# Patient Record
Sex: Male | Born: 1935 | Race: White | Hispanic: No | Marital: Married | Smoking: Former smoker
Health system: Southern US, Community
[De-identification: ages and names within clinical notes are randomized; demographics above are authoritative.]

## PROBLEM LIST (undated history)

## (undated) DIAGNOSIS — M1990 Unspecified osteoarthritis, unspecified site: Secondary | ICD-10-CM

## (undated) DIAGNOSIS — Z953 Presence of xenogenic heart valve: Secondary | ICD-10-CM

## (undated) DIAGNOSIS — E785 Hyperlipidemia, unspecified: Secondary | ICD-10-CM

## (undated) DIAGNOSIS — I10 Essential (primary) hypertension: Secondary | ICD-10-CM

## (undated) DIAGNOSIS — R001 Bradycardia, unspecified: Secondary | ICD-10-CM

## (undated) DIAGNOSIS — R011 Cardiac murmur, unspecified: Secondary | ICD-10-CM

## (undated) DIAGNOSIS — D649 Anemia, unspecified: Secondary | ICD-10-CM

## (undated) DIAGNOSIS — Z7901 Long term (current) use of anticoagulants: Secondary | ICD-10-CM

## (undated) DIAGNOSIS — C61 Malignant neoplasm of prostate: Secondary | ICD-10-CM

## (undated) DIAGNOSIS — E119 Type 2 diabetes mellitus without complications: Secondary | ICD-10-CM

## (undated) DIAGNOSIS — I4891 Unspecified atrial fibrillation: Secondary | ICD-10-CM

## (undated) DIAGNOSIS — R0602 Shortness of breath: Secondary | ICD-10-CM

## (undated) DIAGNOSIS — I350 Nonrheumatic aortic (valve) stenosis: Secondary | ICD-10-CM

## (undated) HISTORY — PX: CATARACT EXTRACTION W/ INTRAOCULAR LENS  IMPLANT, BILATERAL: SHX1307

## (undated) HISTORY — DX: Essential (primary) hypertension: I10

## (undated) HISTORY — DX: Long term (current) use of anticoagulants: Z79.01

## (undated) HISTORY — DX: Unspecified atrial fibrillation: I48.91

## (undated) HISTORY — PX: EYE SURGERY: SHX253

## (undated) HISTORY — DX: Unspecified osteoarthritis, unspecified site: M19.90

## (undated) HISTORY — DX: Bradycardia, unspecified: R00.1

## (undated) HISTORY — DX: Nonrheumatic aortic (valve) stenosis: I35.0

## (undated) HISTORY — DX: Hyperlipidemia, unspecified: E78.5

## (undated) HISTORY — PX: TRANSTHORACIC ECHOCARDIOGRAM: SHX275

---
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
## 2000-01-30 ENCOUNTER — Ambulatory Visit (HOSPITAL_COMMUNITY): Admission: RE | Admit: 2000-01-30 | Discharge: 2000-01-30 | Payer: Self-pay | Admitting: Family Medicine

## 2001-01-08 ENCOUNTER — Encounter: Admission: RE | Admit: 2001-01-08 | Discharge: 2001-01-08 | Payer: Self-pay | Admitting: Gastroenterology

## 2001-01-08 ENCOUNTER — Encounter: Payer: Self-pay | Admitting: Gastroenterology

## 2005-12-16 ENCOUNTER — Ambulatory Visit: Admission: RE | Admit: 2005-12-16 | Discharge: 2006-03-17 | Payer: Self-pay | Admitting: Radiation Oncology

## 2006-02-05 LAB — URINALYSIS, MICROSCOPIC - CHCC
Bilirubin (Urine): NEGATIVE
Blood: NEGATIVE
Glucose: NEGATIVE g/dL
Nitrite: NEGATIVE
Specific Gravity, Urine: 1.01 (ref 1.003–1.035)

## 2006-03-27 ENCOUNTER — Ambulatory Visit: Payer: Self-pay | Admitting: Internal Medicine

## 2007-03-19 ENCOUNTER — Ambulatory Visit: Payer: Self-pay | Admitting: Internal Medicine

## 2007-04-13 ENCOUNTER — Encounter: Admission: RE | Admit: 2007-04-13 | Discharge: 2007-04-13 | Payer: Self-pay | Admitting: Gastroenterology

## 2008-03-14 ENCOUNTER — Ambulatory Visit: Payer: Self-pay | Admitting: Internal Medicine

## 2008-09-06 ENCOUNTER — Ambulatory Visit: Payer: Self-pay | Admitting: Internal Medicine

## 2008-09-07 ENCOUNTER — Ambulatory Visit: Payer: Self-pay

## 2008-09-07 ENCOUNTER — Encounter: Payer: Self-pay | Admitting: Internal Medicine

## 2009-01-09 ENCOUNTER — Ambulatory Visit: Payer: Self-pay | Admitting: Internal Medicine

## 2009-03-22 ENCOUNTER — Encounter: Payer: Self-pay | Admitting: Internal Medicine

## 2009-09-06 DIAGNOSIS — I482 Chronic atrial fibrillation, unspecified: Secondary | ICD-10-CM | POA: Insufficient documentation

## 2009-09-06 DIAGNOSIS — I359 Nonrheumatic aortic valve disorder, unspecified: Secondary | ICD-10-CM | POA: Insufficient documentation

## 2009-09-06 DIAGNOSIS — I4891 Unspecified atrial fibrillation: Secondary | ICD-10-CM

## 2009-09-06 DIAGNOSIS — I10 Essential (primary) hypertension: Secondary | ICD-10-CM | POA: Insufficient documentation

## 2009-09-07 ENCOUNTER — Ambulatory Visit: Payer: Self-pay | Admitting: Internal Medicine

## 2010-09-04 ENCOUNTER — Encounter: Payer: Self-pay | Admitting: Internal Medicine

## 2010-09-04 ENCOUNTER — Ambulatory Visit (HOSPITAL_COMMUNITY): Admission: RE | Admit: 2010-09-04 | Discharge: 2010-09-04 | Payer: Self-pay | Admitting: Internal Medicine

## 2010-09-04 ENCOUNTER — Ambulatory Visit: Payer: Self-pay | Admitting: Cardiovascular Disease

## 2010-09-04 ENCOUNTER — Ambulatory Visit: Payer: Self-pay

## 2010-09-06 ENCOUNTER — Ambulatory Visit: Payer: Self-pay | Admitting: Internal Medicine

## 2010-12-04 NOTE — Assessment & Plan Note (Signed)
Summary: Yearly pt need echo also    History of Present Illness: [name] returns today for followup.  He is a pleasant 77 yo man with a h/o rate controlled atrial fibrillation, Aortic stenosis, hypertension and bradycardia.  Despite the above problems, he has remained asymptomatic with regard to no c/p, sob, palpitations.  He denies syncope.  He remains on chronic coumadin therapy. He has recently undergone 2D echo and his AS has progressed such that his valve area is 0.7 with a mean gradient of 51 mmHg.  He continues to be active with no limitation.  Allergies: No Known Drug Allergies  Past History:  Past Medical History: Last updated: 09/07/2009 atrial fibrillation Aortic stenosis (moderate to severe with valve area 0.95) HTN bradycardia  Review of Systems  The patient denies chest pain, syncope, dyspnea on exertion, and peripheral edema.    Physical Exam  General:  Well developed, well nourished, in no acute distress. Head:  normocephalic and atraumatic Neck:  No JVD  Lungs:  Clear bilaterally with no wheezes, rales, or rhonchi. Heart:  IRIR with normal S1.  S2 is severely reduced.  A low pitched 2/6 systolic murmur is present in the right upper sternal border. Abdomen:  Obese, NT,ND.  No rebound or guarding. Msk:  Back normal, normal gait. Muscle strength and tone normal. Pulses:  pulses normal in all 4 extremities Extremities:  No clubbing or cyanosis. Neurologic:  Alert and oriented x 3.   EKG  Procedure date:  09/06/2010  Findings:      Atrial fibrillation with a controlled ventricular response rate of: 86.  Impression & Recommendations:  Problem # 1:  AORTIC STENOSIS (ICD-424.1) His AS is worse by echo but he remains asymptomatic.  He is emphatic that he is not interested in pursuing surgery at this time. He admits that he will let me know if he develops symptoms of c/p, sob or syncope. His updated medication list for this problem includes:    Toprol Xl  25 Mg Xr24h-tab (Metoprolol succinate) ..... One tab once daily    Lisinopril 20 Mg Tabs (Lisinopril) .Marland Kitchen... Take one tablet by mouth daily    Hydrochlorothiazide 12.5 Mg Tabs (Hydrochlorothiazide) .Marland Kitchen... Take one tablet by mouth daily.    Digoxin 0.25 Mg Tabs (Digoxin) .Marland Kitchen... Take one tablet by mouth daily  Problem # 2:  ATRIAL FIBRILLATION, CHRONIC (ICD-427.31) His ventricula rate is well controlled.  He will continue his current meds. His updated medication list for this problem includes:    Toprol Xl 25 Mg Xr24h-tab (Metoprolol succinate) ..... One tab once daily    Digoxin 0.25 Mg Tabs (Digoxin) .Marland Kitchen... Take one tablet by mouth daily    Warfarin Sodium 7.5 Mg Tabs (Warfarin sodium) ..... Use as directed by anticoagulation clinic  Patient Instructions: 1)  Your physician wants you to follow-up in: 6 months with Dr Court Joy will receive a reminder letter in the mail two months in advance. If you don't receive a letter, please call our office to schedule the follow-up appointment.

## 2010-12-10 ENCOUNTER — Encounter: Payer: Self-pay | Admitting: Internal Medicine

## 2010-12-20 NOTE — Miscellaneous (Signed)
Summary: Orders Update  Clinical Lists Changes  Medications: Removed medication of TOPROL XL 25 MG XR24H-TAB (METOPROLOL SUCCINATE) one tab once daily Added new medication of METOPROLOL TARTRATE 25 MG TABS (METOPROLOL TARTRATE) Take 1/2  tablet by mouth twice a day - Signed Rx of METOPROLOL TARTRATE 25 MG TABS (METOPROLOL TARTRATE) Take 1/2  tablet by mouth twice a day;  #30 x 6;  Signed;  Entered by: Ollen Gross, RN, BSN;  Authorized by: Laren Boom, MD, [facility];  Method used: Electronically to [facility]  [phone]*, [address], [place], [place], [place]  [address], Ph: [phone] or [phone], Fax: [phone]    Prescriptions: METOPROLOL TARTRATE 25 MG TABS (METOPROLOL TARTRATE) Take 1/2  tablet by mouth twice a day  #30 x 6   Entered by:   Ollen Gross, RN, BSN   Authorized by:   Laren Boom, MD, [facility]   Signed by:   Ollen Gross, RN, BSN on 12/10/2010   Method used:   Electronically to        [facility]  [id number]* (retail)       [address]       [place], [place]  [address]       Ph: [phone] or [phone]       Fax: [phone]   RxID:   [id number]

## 2011-03-19 NOTE — Assessment & Plan Note (Signed)
[facility]                         ELECTROPHYSIOLOGY OFFICE NOTE   DEMAREA, LOREY                    MRN:          [id number]  DATE:03/14/2008                            DOB:          09-25-1933    Mr. Paulino returns today for followup.  He is a very pleasant male  with a history of atrial fibrillation and bradycardia.  His rates have  been in the 30s and 40s in the recent past. He returns today for  followup.  The patient denies chest pain.  He denies dizziness.  He  denies shortness of breath.  He has been amazingly well.  He does have  some difficulty with urination and has recently been started on Flomax.   CURRENT MEDICATIONS:  1. Lanoxin 0.25 a day.  2. Warfarin as directed.  3. Lisinopril/hydrochlorothiazide.  4. Doxazosin.  5. Metformin.  6. Glipizide.   PHYSICAL EXAMINATION:  GENERAL:  He is a pleasant, elderly man in no  acute distress.  VITAL SIGNS:  Blood pressure today was 156/82.  The pulse was 46 and  irregular. Respirations were 18.  Weight was 190 pounds.  NECK:  Revealed no jugular distention.  LUNGS:  Clear bilaterally to auscultation.  No wheezes, rales or rhonchi  are present.  CARDIOVASCULAR:  Exam revealed irregular bradycardia with normal S1-S2.  EXTREMITIES:  Demonstrated no edema..   EKG demonstrates atrial fibrillation with a slow ventricular response.   IMPRESSION:  1. Chronic atrial fibrillation with slow ventricular response.  2. Hypertension.   DISCUSSION:  The patient is amazingly stable despite his very severe  bradycardia.  As best I can tell, he is asymptomatic.  I will plan on  seeing the patient back in the office in 1 year.  I have asked today  that he decrease his Lanoxin by half, and he will follow up with Dr.  Benedetto Goad with [facility].     Doylene Canning. Ladona Ridgel, MD  Electronically Signed    GWT/MedQ  DD: 03/14/2008  DT: 03/14/2008  Job #: [id number]   cc:   Gloriajean Dell.  Andrey Campanile, M.D.

## 2011-03-19 NOTE — Assessment & Plan Note (Signed)
[facility]                         ELECTROPHYSIOLOGY OFFICE NOTE   MICHELLE, VANHISE                    MRN:          [id number]  DATE:01/09/2009                            DOB:          06-25-1933    Mr. Peplinski returns today for followup.  He is a very pleasant 76-year-  old male with a history of chronic atrial fibrillation and chronic  Coumadin therapy who also has a history of hypertension who returns  today for followup.  When I saw him last several months ago, I noted  that he had a more prominent aortic stenosis murmur and had him undergo  2-D echocardiography, which demonstrates that he has at least moderate-  to-severe aortic stenosis with an estimated valve area of 0.9 cm  squared.  Despite this, the patient is completely asymptomatic.  He  denies chest pain.  He denies shortness of breath.  In addition, his  echo demonstrated an EF of 65%.  He is here today for followup and is  otherwise stable.  He had no specific complaints today.  Denied any  chest pain, any shortness of breath, or near syncope.   His medications include:  1. Lisinopril 20 mg twice daily.  2. Hydrochlorothiazide 12.5 daily.  3. Potassium gluconate daily.  4. Vitamin D daily.  5. Lanoxin 0.25 mg 2 tablets daily.  6. Warfarin 7.5 mg daily.  7. Lisinopril/hydrochlorothiazide daily.  8. Metoprolol 25 a day.  9. Doxazosin 8 a day.  10.Simvastatin 20 a day.  11.Glipizide ER 5 mg 2 tablets daily  12.Multiple vitamins.  13.He is also on metformin 1 g twice a day.   PHYSICAL EXAMINATION:  GENERAL  He is a pleasant very well-appearing 48-  year-old man in no distress.  VITAL SIGNS:  Blood pressure was 110/70, pulse was 68 and irregular,  respirations were 18.  The weight was 194 pounds.  NECK:  No jugular venous distention.  LUNGS:  Clear to bilateral auscultation.  No wheezes, rales, or rhonchi  are present.  No increased work of breathing.  CARDIOVASCULAR:   Irregular rate and rhythm with normal S1 and S2.  ABDOMEN:  Soft, nontender.  EXTREMITIES:  No edema.   EKG demonstrates atrial fibrillation with controlled ventricular  response.   IMPRESSION:  1. Chronic atrial fibrillation, well controlled.  2. Hypertension, well controlled.  3. Aortic stenosis, presently asymptomatic.   DISCUSSION:  I have recommend a period of watchful waiting with Mr.  Amezcua.  I will see him back in a year.  I have encouraged him to call  us sooner if he develops symptoms of aortic stenosis, specifically chest  pain, shortness of breath syncope, or syncope.     Doylene Canning. Ladona Ridgel, MD  Electronically Signed    GWT/MedQ  DD: 01/09/2009  DT: 01/10/2009  Job #: [id number]   cc:   Gloriajean Dell. Andrey Campanile, M.D.

## 2011-03-19 NOTE — Assessment & Plan Note (Signed)
[facility]                         ELECTROPHYSIOLOGY OFFICE NOTE   [name], Aaron                    MRN:          [id number]  DATE:09/06/2008                            DOB:          [birth year]    [name] returns today for followup.  He is a very pleasant 77-year-  old male with a history of atrial fibrillation which has been chronic.  He had a history of significant bradycardia and AFib, which has improved  over the last year with decreasing of his digoxin dosing.  He returns  today for followup.  He has done quite well in the last year.  He is  able to work in his yard and had no specific complaints today.  He tells  me that his diabetes has been fairly well controlled.  He is little  concerned about his blood pressure which has been slightly elevated,  typically in the 130-140 or perhaps as high as 150 range.  On the other  hand, he has not had any significant dizziness or lightheadedness which  had previously been once of his main complaints when he was bradycardic.  He has had no syncope.  He denies other symptoms.   MEDICATIONS:  Today include;  1. Warfarin as directed.  2. Lanoxin 0.25 a day.  3. Lisinopril/HCTZ 20/12.5 a day.  4. Metoprolol extended release 25 a day.  5. Doxazosin 8 mg a day.  6. Simvastatin 20 a day.  7. Glipizide ER 5 mg 2 tablets daily.  8. Multiple vitamins.  9. Metformin 1 g twice daily.   PHYSICAL EXAMINATION:  GENERAL:  He is a pleasant well-appearing 77-year-  old man in no acute distress.  VITAL SIGNS:  Blood pressure was 132/80, the pulse was 60 and regular,  the respirations were 18, and the weight was 194 pounds.  NECK:  No  jugular venous distention.  LUNGS:  Clear bilaterally to auscultation.  No wheezes, rales, or  rhonchi are present.  CARDIOVASCULAR:  Irregular regular rhythm and normal S1 and S2.  There  is a great 3/6 systolic murmur at the right upper sternal border.  I had  not  appreciated this previously.  ABDOMEN:  Soft and nontender.  EXTREMITIES:  No edema.   EKG demonstrates atrial fibrillation with left anterior fascicular block  and a rate of 60 beats per minute.   IMPRESSION:  1. Chronic atrial fibrillation.  Now, nicely rate controlled with no      symptomatic bradycardia.  2. Hypertension.  Slightly elevated, but probably reasonably well      controlled.  3. Abnormal cardiac evaluation exam was of fairly prominent murmur of      either aortic stenosis or aortic sclerosis.  I cannot tell which      based on the exam, though I do not think he has critical aortic      stenosis based on the exam.   DISCUSSION:  Overall, Mr. Loewen is stable today.  I am little bit  concerned about his murmur of aortic stenosis and for this reason, I  will obtain a 2-D echo.  He has  not tried to get a influenza vaccine up  until now, but his primary physician Dr. Benedetto Goad has not had any  available in his office according to the patient.  For this reason, I  will have him undergo influenza vaccination today as well.  I have asked  that he followup with his primary physician, Dr. Benedetto Goad, for  ongoing blood pressure evaluation.  I have encouraged him to increase  his walking, lower his salt intake, and try to lose little bit of  weight.  I will plan to see the patient back in 1 year.     Doylene Canning. Ladona Ridgel, MD  Electronically Signed    GWT/MedQ  DD: 09/06/2008  DT: 09/06/2008  Job #: [id number]   cc:   Gloriajean Dell. Andrey Campanile, M.D.

## 2011-03-19 NOTE — Assessment & Plan Note (Signed)
[facility]                         ELECTROPHYSIOLOGY OFFICE NOTE   ALDRED, MASE                      MRN:          [id number]  DATE:03/19/2007                            DOB:          03-Aug-1933    [name] returns today for follow-up of his atrial fibrillation.  He  is a very pleasant 77 year old man with hypertension, dyslipidemia and  atrial fibrillation.  He has undergone a strategy of rate control and  has done quite well with this.  He has no symptoms of his atrial  fibrillation.  He denies palpitations.  He denies chest pressure.  He  rarely will feel some mild dizziness when he stands up real quickly.  Otherwise he had no specific complaints.  He denies peripheral edema.   PHYSICAL EXAMINATION:  GENERAL APPEARANCE:  He is a pleasant well-  appearing 77 year old man in no distress.  VITAL SIGNS:  Blood pressure was 122/70, pulse 61 and irregular,  respirations 18, weight 195 pounds.  NECK:  No jugular venous distension.  LUNGS:  Clear to auscultation bilaterally.  No wheezing, rhonchi or  rales.  CARDIOVASCULAR:  Irregularly irregular rhythm with normal S1 and S2.  EXTREMITIES:  No edema.   His EKG demonstrates atrial fibrillation with an occasional PVC and a  left anterior fascicular block.   IMPRESSION:  1. Symptomatic atrial fibrillation.  2. Chronic rate control therapy.  3. Chronic Coumadin therapy.  4. Hypertension.   DISCUSSION:  Overall, [name] is stable and his blood pressure is  well controlled.  His atrial fibrillation is rate controlled very nicely  and he is asymptomatic with this.  He will continue on Coumadin and he  is tolerating this very well.  Will plan to see him back in the office  in one year.     Doylene Canning. Ladona Ridgel, MD  Electronically Signed    GWT/MedQ  DD: 03/19/2007  DT: 03/19/2007  Job #: [id number]   cc:   Gloriajean Dell. Andrey Campanile, M.D.

## 2011-04-09 ENCOUNTER — Encounter: Payer: Self-pay | Admitting: Internal Medicine

## 2011-07-24 ENCOUNTER — Other Ambulatory Visit: Payer: Self-pay | Admitting: Internal Medicine

## 2011-09-12 ENCOUNTER — Ambulatory Visit (INDEPENDENT_AMBULATORY_CARE_PROVIDER_SITE_OTHER): Payer: Medicare Other | Admitting: Internal Medicine

## 2011-09-12 ENCOUNTER — Encounter: Payer: Self-pay | Admitting: Internal Medicine

## 2011-09-12 VITALS — BP 130/60 | HR 62 | Ht 67.0 in | Wt 191.0 lb

## 2011-09-12 DIAGNOSIS — I4891 Unspecified atrial fibrillation: Secondary | ICD-10-CM

## 2011-09-12 DIAGNOSIS — I10 Essential (primary) hypertension: Secondary | ICD-10-CM

## 2011-09-12 DIAGNOSIS — I359 Nonrheumatic aortic valve disorder, unspecified: Secondary | ICD-10-CM

## 2011-09-12 NOTE — Assessment & Plan Note (Signed)
Despite his severe aortic stenosis, he is asymptomatic. I discussed the warning signs with the patient. He still is not interested in pursuing surgery until he becomes symptomatic. He will call us for chest pain, shortness of breath, or syncope.

## 2011-09-12 NOTE — Progress Notes (Signed)
HPI [name] returns today for followup. He is a very pleasant 77 year old man with a history of hypertension, chronic atrial fibrillation, and aortic stenosis. I last saw him a year ago. At that time he had surgical aortic stenosis but was emphatic about not considering having any cardiac surgery. I warned him about the risk factors suggesting that his bowels getting worse and he agreed that he would call me if he had symptoms. Over the last year, he has done well. Yesterday he replaced his water heater, carrying the old one out of his house. He did note sob with extreme exertion. No syncope or chest pain. No Known Allergies   Current Outpatient Prescriptions  Medication Sig Dispense Refill  . cholecalciferol (VITAMIN D) 1000 UNITS tablet Take 1,000 Units by mouth 2 (two) times daily.        . Cyanocobalamin (VITAMIN B-12 CR) 1500 MCG TBCR Take 1 tablet by mouth daily.        . digoxin (LANOXIN) 0.25 MG tablet Take 250 mcg by mouth daily.        Marland Kitchen doxazosin (CARDURA) 8 MG tablet Take 8 mg by mouth at bedtime.        Marland Kitchen glipiZIDE (GLUCOTROL) 5 MG tablet Take 5 mg by mouth 2 (two) times daily before a meal.        . hydrochlorothiazide (MICROZIDE) 12.5 MG capsule Take 12.5 mg by mouth daily.        Marland Kitchen lisinopril-hydrochlorothiazide (PRINZIDE,ZESTORETIC) 20-12.5 MG per tablet Take 1 tablet by mouth 2 (two) times daily.        . magnesium gluconate (MAGONATE) 500 MG tablet Take 500 mg by mouth 2 (two) times daily.        . metFORMIN (GLUCOPHAGE) 1000 MG tablet Take 1,000 mg by mouth 2 (two) times daily with a meal.        . metoprolol tartrate (LOPRESSOR) 25 MG tablet TAKE ONE-HALF TABLET BY MOUTH TWICE DAILY.  30 tablet  6  . RA POTASSIUM GLUCONATE PO Take 99 mg by mouth 2 (two) times daily.        . Selenium 200 MCG CAPS Take 1 capsule by mouth daily.        . simvastatin (ZOCOR) 40 MG tablet Take 40 mg by mouth at bedtime.        . vitamin A (BL VITAMIN A) 8000 UNIT capsule Take 8,000 Units by  mouth daily.        . vitamin C (ASCORBIC ACID) 500 MG tablet Take 500 mg by mouth 2 (two) times daily.        Marland Kitchen warfarin (COUMADIN) 7.5 MG tablet Take 7.5 mg by mouth as directed.        . zinc gluconate 50 MG tablet Take 50 mg by mouth daily.           Past Medical History  Diagnosis Date  . Atrial fibrillation   . Aortic stenosis      moderate to severe with valve area 0.95  . HTN (hypertension)   . Bradycardia     ROS:   All systems reviewed and negative except as noted in the HPI.   Past Surgical History  Procedure Date  . Transthoracic echocardiogram 09/04/10, 09/07/08     No family history on file.   History   Social History  . Marital Status: Married    Spouse Name: N/A    Number of Children: N/A  . Years of Education: N/A   Occupational History  .  Not on file.   Social History Main Topics  . Smoking status: Former Smoker    Quit date: 09/11/1976  . Smokeless tobacco: Not on file  . Alcohol Use: Not on file  . Drug Use: Not on file  . Sexually Active: Not on file   Other Topics Concern  . Not on file   Social History Narrative  . No narrative on file     There were no vitals taken for this visit.  Physical Exam:  Well appearing 77 year old man, NAD HEENT: Unremarkable Neck:  No JVD, no thyromegally Lymphatics:  No adenopathy Back:  No CVA tenderness Lungs:  Clear with no wheezes, rales, or rhonchi. HEART:  IRegular rate rhythm,  Grade 2/6 systolic murmur heard best at the right upper sternal border. S2 is reduced. Abd:  soft, positive bowel sounds, no organomegally, no rebound, no guarding Ext:  2 plus pulses, no edema, no cyanosis, no clubbing Skin:  No rashes no nodules Neuro:  CN II through XII intact, motor grossly intact  EKG Atrial fibrillation with occasional PVCs Assess/Plan:

## 2011-09-12 NOTE — Assessment & Plan Note (Signed)
He is instructed to maintain a low-sodium diet and continue his current medical therapy.

## 2011-09-12 NOTE — Assessment & Plan Note (Signed)
His symptoms and ventricular rate are well-controlled. He will continue his current medical therapy.

## 2011-09-12 NOTE — Patient Instructions (Signed)
Your physician wants you to follow-up in: 1 year with Dr. Taylor. You will receive a reminder letter in the mail two months in advance. If you don't receive a letter, please call our office to schedule the follow-up appointment.  Your physician recommends that you continue on your current medications as directed. Please refer to the Current Medication list given to you today.  

## 2012-02-18 ENCOUNTER — Encounter (INDEPENDENT_AMBULATORY_CARE_PROVIDER_SITE_OTHER): Payer: Self-pay | Admitting: Ophthalmology

## 2012-02-19 ENCOUNTER — Encounter (INDEPENDENT_AMBULATORY_CARE_PROVIDER_SITE_OTHER): Payer: Medicare Other | Admitting: Ophthalmology

## 2012-02-19 ENCOUNTER — Other Ambulatory Visit: Payer: Self-pay | Admitting: Internal Medicine

## 2012-02-19 DIAGNOSIS — H35039 Hypertensive retinopathy, unspecified eye: Secondary | ICD-10-CM

## 2012-02-19 DIAGNOSIS — I10 Essential (primary) hypertension: Secondary | ICD-10-CM

## 2012-02-19 DIAGNOSIS — E1139 Type 2 diabetes mellitus with other diabetic ophthalmic complication: Secondary | ICD-10-CM

## 2012-02-19 DIAGNOSIS — H26499 Other secondary cataract, unspecified eye: Secondary | ICD-10-CM

## 2012-02-19 DIAGNOSIS — E11319 Type 2 diabetes mellitus with unspecified diabetic retinopathy without macular edema: Secondary | ICD-10-CM

## 2012-02-19 DIAGNOSIS — H43819 Vitreous degeneration, unspecified eye: Secondary | ICD-10-CM

## 2012-03-02 ENCOUNTER — Ambulatory Visit (INDEPENDENT_AMBULATORY_CARE_PROVIDER_SITE_OTHER): Payer: Medicare Other | Admitting: Ophthalmology

## 2012-03-02 DIAGNOSIS — H2700 Aphakia, unspecified eye: Secondary | ICD-10-CM

## 2012-03-09 ENCOUNTER — Encounter (INDEPENDENT_AMBULATORY_CARE_PROVIDER_SITE_OTHER): Payer: Self-pay | Admitting: Ophthalmology

## 2012-05-12 ENCOUNTER — Other Ambulatory Visit: Payer: Self-pay | Admitting: Urology

## 2012-05-12 DIAGNOSIS — IMO0002 Reserved for concepts with insufficient information to code with codable children: Secondary | ICD-10-CM

## 2012-05-12 DIAGNOSIS — N62 Hypertrophy of breast: Secondary | ICD-10-CM

## 2012-05-18 ENCOUNTER — Ambulatory Visit
Admission: RE | Admit: 2012-05-18 | Discharge: 2012-05-18 | Disposition: A | Discharge status: Home / Self Care | Payer: Medicare Other | Source: Ambulatory Visit | Attending: Urology | Admitting: Urology

## 2012-05-18 DIAGNOSIS — IMO0002 Reserved for concepts with insufficient information to code with codable children: Secondary | ICD-10-CM

## 2012-05-18 DIAGNOSIS — N62 Hypertrophy of breast: Secondary | ICD-10-CM

## 2012-09-11 ENCOUNTER — Ambulatory Visit (INDEPENDENT_AMBULATORY_CARE_PROVIDER_SITE_OTHER): Payer: Medicare Other | Admitting: Internal Medicine

## 2012-09-11 ENCOUNTER — Encounter: Payer: Self-pay | Admitting: Internal Medicine

## 2012-09-11 VITALS — BP 130/70 | HR 63 | Ht 69.6 in | Wt 187.0 lb

## 2012-09-11 DIAGNOSIS — I350 Nonrheumatic aortic (valve) stenosis: Secondary | ICD-10-CM

## 2012-09-11 DIAGNOSIS — I359 Nonrheumatic aortic valve disorder, unspecified: Secondary | ICD-10-CM

## 2012-09-11 DIAGNOSIS — I4891 Unspecified atrial fibrillation: Secondary | ICD-10-CM

## 2012-09-11 NOTE — Assessment & Plan Note (Signed)
I discussed treatment options with the patient. We will repeat his 2-D echo to look at his left ventricular function. I will refer him to my partner Dr. Excell Seltzer to consider all options for therapy.

## 2012-09-11 NOTE — Progress Notes (Signed)
HPI [name] returns today for followup. He is a very pleasant 78 year old man with a history of critical aortic stenosis, who is been asymptomatic in the past. 2 years ago, he has surgical disease but no symptoms and despite my urging to consider surgery, refused. When I saw me year ago, he was still asymptomatic and continued to refuse consideration of any surgical procedure. In the last few months, he has noted dyspnea with exertion. He has had no syncope and no chest pain. He remains active but notes that with any significant exertion, he gets short of breath and has to stop what he is doing. He can walk just fine on flat ground. The patient has been uninterested in aortic valve replacement utilizing a thoracotomy were median sternotomy that is interested in percutaneous options. No Known Allergies   Current Outpatient Prescriptions  Medication Sig Dispense Refill  . B Complex Vitamins (VITAMIN B-COMPLEX PO) Take by mouth. 50 mcg 1 tab PO      . cholecalciferol (VITAMIN D) 1000 UNITS tablet Take 1,000 Units by mouth 2 (two) times daily.        . Cyanocobalamin (VITAMIN B-12 CR) 1500 MCG TBCR Take 1 tablet by mouth daily.        . digoxin (LANOXIN) 0.25 MG tablet Take 250 mcg by mouth daily.        Marland Kitchen doxazosin (CARDURA) 8 MG tablet Take 8 mg by mouth at bedtime.        Marland Kitchen glipiZIDE (GLUCOTROL) 5 MG tablet Take 5 mg by mouth 2 (two) times daily before a meal.        . hydrochlorothiazide (MICROZIDE) 12.5 MG capsule Take 12.5 mg by mouth daily.        Marland Kitchen lisinopril-hydrochlorothiazide (PRINZIDE,ZESTORETIC) 20-12.5 MG per tablet Take 2 tablets by mouth daily.       . magnesium gluconate (MAGONATE) 500 MG tablet Take 500 mg by mouth 2 (two) times daily.        . metFORMIN (GLUCOPHAGE) 1000 MG tablet Take 1,000 mg by mouth 2 (two) times daily with a meal.        . metoprolol tartrate (LOPRESSOR) 25 MG tablet TAKE ONE-HALF TABLET BY MOUTH TWICE DAILY  30 tablet  6  . NON FORMULARY Testerone  injection (1 Shot)  every two weeks.      Marland Kitchen RA POTASSIUM GLUCONATE PO Take 99 mg by mouth 2 (two) times daily.        . Selenium 200 MCG CAPS Take 1 capsule by mouth daily.        . simvastatin (ZOCOR) 40 MG tablet Take 40 mg by mouth at bedtime.        . vitamin A (BL VITAMIN A) 8000 UNIT capsule Take 8,000 Units by mouth daily.        . vitamin C (ASCORBIC ACID) 500 MG tablet Take 500 mg by mouth 2 (two) times daily.        Marland Kitchen warfarin (COUMADIN) 7.5 MG tablet Take 7.5 mg by mouth as directed.        . zinc gluconate 50 MG tablet Take 50 mg by mouth daily.           Past Medical History  Diagnosis Date  . Atrial fibrillation   . Aortic stenosis      moderate to severe with valve area 0.95  . HTN (hypertension)   . Bradycardia     ROS:   All systems reviewed and negative except as noted in  the HPI.   Past Surgical History  Procedure Date  . Transthoracic echocardiogram 09/04/10, 09/07/08     No family history on file.   History   Social History  . Marital Status: Married    Spouse Name: N/A    Number of Children: N/A  . Years of Education: N/A   Occupational History  . Not on file.   Social History Main Topics  . Smoking status: Former Smoker    Quit date: 09/11/1976  . Smokeless tobacco: Not on file  . Alcohol Use: Not on file  . Drug Use: Not on file  . Sexually Active: Not on file   Other Topics Concern  . Not on file   Social History Narrative  . No narrative on file     BP 130/70  Pulse 63  Ht 5' 9.6" (1.768 m)  Wt 187 lb (84.823 kg)  BMI 27.14 kg/m2  Physical Exam:  Well appearing 78 year old man, NAD HEENT: Unremarkable Neck:  7 cm JVD, no thyromegally Lungs:  Clear HEART:  Regular rate rhythm, grade 2/6 systolic murmur heard best at the upper sternal border., no rubs, no clicks Abd:  soft, positive bowel sounds, no organomegally, no rebound, no guarding Ext:  2 plus pulses, no edema, no cyanosis, no clubbing Skin:  No rashes no  nodules Neuro:  CN II through XII intact, motor grossly intact  EKG atrial fibrillation with right bundle branch block    Assess/Plan:

## 2012-09-11 NOTE — Assessment & Plan Note (Signed)
His ventricular rate appears to be well-controlled. No change in medical therapy. 

## 2012-09-11 NOTE — Patient Instructions (Addendum)
Your physician wants you to follow-up in: 12 months with Dr Court Joy will receive a reminder letter in the mail two months in advance. If you don't receive a letter, please call our office to schedule the follow-up appointment.   Your physician has requested that you have an echocardiogram. Echocardiography is a painless test that uses sound waves to create images of your heart. It provides your doctor with information about the size and shape of your heart and how well your heart's chambers and valves are working. This procedure takes approximately one hour. There are no restrictions for this procedure.  You have been referred to Dr Excell Seltzer

## 2012-09-16 ENCOUNTER — Ambulatory Visit (HOSPITAL_COMMUNITY): Payer: Medicare Other | Attending: Cardiovascular Disease | Admitting: Radiology

## 2012-09-16 DIAGNOSIS — R9389 Abnormal findings on diagnostic imaging of other specified body structures: Secondary | ICD-10-CM | POA: Insufficient documentation

## 2012-09-16 DIAGNOSIS — I080 Rheumatic disorders of both mitral and aortic valves: Secondary | ICD-10-CM | POA: Insufficient documentation

## 2012-09-16 DIAGNOSIS — I079 Rheumatic tricuspid valve disease, unspecified: Secondary | ICD-10-CM | POA: Insufficient documentation

## 2012-09-16 DIAGNOSIS — E785 Hyperlipidemia, unspecified: Secondary | ICD-10-CM | POA: Insufficient documentation

## 2012-09-16 DIAGNOSIS — I4510 Unspecified right bundle-branch block: Secondary | ICD-10-CM | POA: Insufficient documentation

## 2012-09-16 DIAGNOSIS — I359 Nonrheumatic aortic valve disorder, unspecified: Secondary | ICD-10-CM

## 2012-09-16 DIAGNOSIS — I4891 Unspecified atrial fibrillation: Secondary | ICD-10-CM | POA: Insufficient documentation

## 2012-09-16 DIAGNOSIS — R0609 Other forms of dyspnea: Secondary | ICD-10-CM | POA: Insufficient documentation

## 2012-09-16 DIAGNOSIS — I350 Nonrheumatic aortic (valve) stenosis: Secondary | ICD-10-CM

## 2012-09-16 DIAGNOSIS — R0989 Other specified symptoms and signs involving the circulatory and respiratory systems: Secondary | ICD-10-CM | POA: Insufficient documentation

## 2012-09-16 DIAGNOSIS — I10 Essential (primary) hypertension: Secondary | ICD-10-CM | POA: Insufficient documentation

## 2012-09-16 NOTE — Progress Notes (Signed)
Echocardiogram performed.  

## 2012-09-18 ENCOUNTER — Other Ambulatory Visit: Payer: Self-pay | Admitting: Internal Medicine

## 2012-11-27 ENCOUNTER — Telehealth: Payer: Self-pay | Admitting: *Deleted

## 2012-11-27 NOTE — Telephone Encounter (Signed)
Patient aware of his echo results.  He has never been made an appointment with Dr Excell Seltzer.  I have again sent this to [facility].  Patient aware and will call if he does not here from anyone

## 2012-12-07 ENCOUNTER — Ambulatory Visit (INDEPENDENT_AMBULATORY_CARE_PROVIDER_SITE_OTHER): Payer: Medicare Other | Admitting: Cardiovascular Disease

## 2012-12-07 ENCOUNTER — Encounter: Payer: Self-pay | Admitting: Cardiovascular Disease

## 2012-12-07 VITALS — BP 172/72 | HR 60 | Ht 68.0 in | Wt 189.0 lb

## 2012-12-07 DIAGNOSIS — I359 Nonrheumatic aortic valve disorder, unspecified: Secondary | ICD-10-CM

## 2012-12-07 NOTE — Progress Notes (Signed)
HPI:  79 year old gentleman presenting for evaluation of severe aortic stenosis. He has been followed for years by Dr. Ladona Ridgel. He has long-standing severe aortic stenosis, previously asymptomatic but now with exertional dyspnea. Surgical consultation has been recommended but the patient has not been willing to pursue this. He was amenable to consideration of less invasive therapies and presents today to explore other options, including TAVR.  Over the last year the patient has developed symptoms of exertional shortness of breath. This occurs with walking a moderate distance or doing physical work. When he uses his chainsaw he has to rest frequently. He denies exertional chest tightness or pain. He denies lightheadedness, palpitations, edema, orthopnea, or PND. 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 He remains physically active. The patient lives at home with his wife who is limited by osteoarthritis. The patient has no history of stroke or TIA. He's had no major surgeries.  Outpatient Encounter Prescriptions as of 12/07/2012  Medication Sig Dispense Refill  . B Complex Vitamins (VITAMIN B-COMPLEX PO) Take by mouth. 50 mcg 1 tab PO      . cholecalciferol (VITAMIN D) 1000 UNITS tablet Take 1,000 Units by mouth 2 (two) times daily.        . Cyanocobalamin (VITAMIN B-12 CR) 1500 MCG TBCR Take 1 tablet by mouth daily.        . digoxin (LANOXIN) 0.25 MG tablet Take 250 mcg by mouth daily.        Marland Kitchen doxazosin (CARDURA) 8 MG tablet Take 8 mg by mouth at bedtime.        Marland Kitchen glipiZIDE (GLUCOTROL) 5 MG tablet Take 5 mg by mouth 2 (two) times daily before a meal.        . lisinopril-hydrochlorothiazide (PRINZIDE,ZESTORETIC) 20-12.5 MG per tablet Take 2 tablets by mouth daily.       . magnesium gluconate (MAGONATE) 500 MG tablet Take 500 mg by mouth 2 (two) times daily.        . metFORMIN (GLUCOPHAGE) 1000 MG tablet Take 1,000 mg by mouth 2 (two) times daily with a meal.        .  metoprolol tartrate (LOPRESSOR) 25 MG tablet TAKE ONE-HALF TABLET BY MOUTH TWICE DAILY  90 tablet  3  . RA POTASSIUM GLUCONATE PO Take 595 mg by mouth 2 (two) times daily.       . Selenium 200 MCG CAPS Take 1 capsule by mouth daily.        . simvastatin (ZOCOR) 40 MG tablet Take 40 mg by mouth at bedtime.        . vitamin A (BL VITAMIN A) 8000 UNIT capsule Take 8,000 Units by mouth daily.        . vitamin C (ASCORBIC ACID) 500 MG tablet Take 500 mg by mouth 2 (two) times daily.        Marland Kitchen warfarin (COUMADIN) 7.5 MG tablet Take 7.5 mg by mouth as directed.        . zinc gluconate 50 MG tablet Take 50 mg by mouth daily.        . [DISCONTINUED] hydrochlorothiazide (MICROZIDE) 12.5 MG capsule Take 12.5 mg by mouth daily.        . [DISCONTINUED] NON FORMULARY Testerone injection (1 Shot)  every two weeks.        Review of patient's allergies indicates no known allergies.  Past Medical History  Diagnosis Date  . Atrial fibrillation   . Aortic stenosis  moderate to severe with valve area 0.95  . HTN (hypertension)   . Bradycardia     Past Surgical History  Procedure Date  . Transthoracic echocardiogram 09/04/10, 09/07/08    History   Social History  . Marital Status: Married    Spouse Name: N/A    Number of Children: N/A  . Years of Education: N/A   Occupational History  . Not on file.   Social History Main Topics  . Smoking status: Former Smoker    Quit date: 09/11/1976  . Smokeless tobacco: Not on file  . Alcohol Use: Not on file  . Drug Use: Not on file  . Sexually Active: Not on file   Other Topics Concern  . Not on file   Social History Narrative  . No narrative on file   family history: The patient's father had cardiac disease but this was poorly defined. He never had bypass surgery or stenting.  ROS:  General: no fevers/chills/night sweats Eyes: no blurry vision, diplopia, or amaurosis ENT: no sore throat or hearing loss Resp: no cough, wheezing, or  hemoptysis CV: no edema or palpitations GI: no abdominal pain, nausea, vomiting, diarrhea, or constipation GU: no dysuria, frequency, or hematuria Skin: no rash Neuro: no headache. Positive for bilateral foot numbness that he relates to neuropathy. Musculoskeletal: Positive for wrist pain Heme: no bleeding, DVT, or easy bruising Endo: no polydipsia or polyuria  BP 172/72  Pulse 60  Ht 5\' 8"  (1.727 m)  Wt 85.73 kg (189 lb)  BMI 28.74 kg/m2  PHYSICAL EXAM: Pt is alert and oriented, WD, WN, in no distress. HEENT: normal Neck: JVP normal. Carotid upstrokes normal with bilateral bruits. No thyromegaly. Lungs: equal expansion, clear bilaterally CV: Apex is discrete and nondisplaced, RRR with grade 3/6 crescendo decrescendo murmur at the carina upper sternal border, no diastolic murmur.  Abd: soft, NT, +BS, no bruit, no hepatosplenomegaly Back: no CVA tenderness Ext: no C/C/E        Femoral pulses 2+= without bruits        DP/PT pulses intact and = Skin: warm and dry without rash Neuro: CNII-XII intact             Strength intact = bilaterally  2D Echo: Left ventricle: The cavity size was mildly dilated. Wall thickness was increased in a pattern of mild LVH. Systolic function was normal. The estimated ejection fraction was in the range of 55% to 60%.  ------------------------------------------------------------ Aortic valve: The aortic valve gradient may be overestimated in the presence of aortic insufficiency. Moderately thickened, moderately calcified leaflets. Doppler: There was severe stenosis. Mild regurgitation. VTI ratio of LVOT to aortic valve: 0.21. Indexed valve area: 0.24cm^2/m^2 (VTI). Peak velocity ratio of LVOT to aortic valve: 0.18. Indexed valve area: 0.2cm^2/m^2 (Vmax). Mean gradient: 66mm Hg (S). Peak gradient: Hg (S).  ------------------------------------------------------------ Aorta: Aortic root: The aortic root was normal in size. Ascending aorta:  The ascending aorta was normal in size.  ------------------------------------------------------------ Mitral valve: Mildly thickened leaflets . Doppler: Trivial regurgitation.  ------------------------------------------------------------ Left atrium: The atrium was moderately to severely dilated.  ------------------------------------------------------------ Right ventricle: The cavity size was mildly dilated. Systolic function was mildly reduced.  ------------------------------------------------------------ Pulmonic valve: Doppler: No significant regurgitation.  ------------------------------------------------------------ Tricuspid valve: Doppler: Mild regurgitation.  ------------------------------------------------------------ Pulmonary artery: Systolic pressure was moderately increased.  ------------------------------------------------------------ Right atrium: The atrium was severely dilated.  ------------------------------------------------------------ Pericardium: There was no pericardial effusion.  ------------------------------------------------------------ Systemic veins: Inferior vena cava: The vessel was dilated; the respirophasic diameter changes  were blunted (< 50%); findings are consistent with elevated central venous pressure.  ------------------------------------------------------------  2D measurements Normal Doppler measurements Norma Left ventricle l LVID ED, 54.3 mm 43-52 Main pulmonary artery chord, Pressure, 55 mm Hg =30 PLAX S LVID ES, 31.8 mm 23-38 LVOT chord, Peak vel, 97 cm/s ----- PLAX S FS, chord, 41 % >29 VTI, S 27. cm ----- PLAX 7 LVPW, ED 12.7 mm ------ Aortic valve IVS/LVPW 0.99 <1.3 Peak vel, 545 cm/s ----- ratio, ED S Ventricular septum Mean vel, 367 cm/s ----- IVS, ED 12.6 mm ------ S LVOT VTI, S 132 cm ----- Diam, S 17 mm ------ Mean 66 mm Hg ----- Area 2.27 cm^2 ------ gradient, Aorta S Root diam, 26 mm ------ Peak 119 mm Hg  ----- ED gradient, Left atrium S AP dim 57 mm ------ VTI ratio 0.2 ----- AP dim 2.84 cm/m^2 <2.2 LVOT/AV 1 index Area index 0.2 cm^2/m^2 ----- (VTI) 4 Peak vel 0.1 ----- ratio, 8 LVOT/AV Area index 0.2 cm^2/m^2 ----- (Vmax) Mitral valve Max regurg 731 cm/s ----- vel Regurg VTI 238 cm ----- Tricuspid valve Regurg 352 cm/s ----- peak vel Peak RV-RA 50 mm Hg ----- gradient, S Systemic veins Estimated 5 mm Hg ----- CVP Right ventricle Pressure, 55 mm Hg <30 S  STS Risk Calculator: Procedure Name Isolated AVRepl Risk of Mortality 2.322% Morbidity or Mortality 15.740% Long Length of Stay 5.962% Short Length of Stay 35.877% Permanent Stroke 1.300% Prolonged Ventilation 8.730% DSW Infection 0.254% Renal Failure 4.347% Reoperation 7.180%   ASSESSMENT AND PLAN: 1. Severe symptomatic aortic stenosis. I have reviewed the patient's echocardiogram images. His exam and echo are both consistent with severe AS. We discussed the natural history of aortic stenosis as well as the potential treatment options. He understands that he is now symptomatic, and aortic valve replacement is therefore recommended. He also understands his risk of developing complications related to aortic stenosis, including progressive dyspnea, heart failure, and cardiac death.  He does not want to have open surgical aortic valve replacement. However, his risk is too low to be considered for TAVR. He is not even high enough risk to be considered for a "moderate risk" TAVR clinical trial. I have recommended that we proceed with right and left heart catheterization to evaluate for coronary artery disease and the findings hemodynamics. He appears at least willing to consider surgical AVR if necessary.  2. Permanent atrial fibrillation. The patient will hold warfarin 5 days before his heart catheterization. Will resume it following the procedure if no complications. I will not do any bridging with Lovenox as he said no history of  stroke or TIA.  Tonny Bollman 12/07/2012 11:34 AM

## 2012-12-07 NOTE — Patient Instructions (Addendum)
Your physician has requested that you have a cardiac catheterization. Cardiac catheterization is used to diagnose and/or treat various heart conditions. Doctors may recommend this procedure for a number of different reasons. The most common reason is to evaluate chest pain. Chest pain can be a symptom of coronary artery disease (CAD), and cardiac catheterization can show whether plaque is narrowing or blocking your heart's arteries. This procedure is also used to evaluate the valves, as well as measure the blood flow and oxygen levels in different parts of your heart. For further information please visit [email or web address] Please follow instruction sheet, as given.  Your physician recommends that you continue on your current medications as directed. Please refer to the Current Medication list given to you today.  Your physician recommends that you return for lab work:  BMP, CBC and PT/INR (12/15/12 7:30-10:00)

## 2012-12-15 ENCOUNTER — Other Ambulatory Visit (INDEPENDENT_AMBULATORY_CARE_PROVIDER_SITE_OTHER): Payer: Medicare Other

## 2012-12-15 DIAGNOSIS — I359 Nonrheumatic aortic valve disorder, unspecified: Secondary | ICD-10-CM

## 2012-12-15 LAB — CBC WITH DIFFERENTIAL/PLATELET
Basophils Absolute: 0 10*3/uL (ref 0.0–0.1)
Eosinophils Absolute: 0.1 10*3/uL (ref 0.0–0.7)
Lymphocytes Relative: 13.9 % (ref 12.0–46.0)
MCHC: 33.7 g/dL (ref 30.0–36.0)
MCV: 98.3 fl (ref 78.0–100.0)
Monocytes Absolute: 0.8 10*3/uL (ref 0.1–1.0)
Neutrophils Relative %: 72.6 % (ref 43.0–77.0)
Platelets: 125 10*3/uL — ABNORMAL LOW (ref 150.0–400.0)

## 2012-12-15 LAB — BASIC METABOLIC PANEL
Chloride: 95 mEq/L — ABNORMAL LOW (ref 96–112)
GFR: 65.37 mL/min (ref >60.00)
Potassium: 4.7 mEq/L (ref 3.5–5.1)
Sodium: 135 mEq/L (ref 135–145)

## 2012-12-15 LAB — PROTIME-INR: Prothrombin Time: 13.9 s — ABNORMAL HIGH (ref 10.2–12.4)

## 2012-12-16 ENCOUNTER — Inpatient Hospital Stay (HOSPITAL_BASED_OUTPATIENT_CLINIC_OR_DEPARTMENT_OTHER)
Admission: RE | Admit: 2012-12-16 | Discharge: 2012-12-16 | Disposition: A | Discharge status: Home / Self Care | Payer: Medicare Other | Source: Ambulatory Visit | Attending: Cardiovascular Disease | Admitting: Cardiovascular Disease

## 2012-12-16 ENCOUNTER — Encounter (HOSPITAL_BASED_OUTPATIENT_CLINIC_OR_DEPARTMENT_OTHER)
Admission: RE | Disposition: A | Discharge status: Home / Self Care | Payer: Self-pay | Source: Ambulatory Visit | Attending: Cardiovascular Disease

## 2012-12-16 ENCOUNTER — Other Ambulatory Visit: Payer: Self-pay | Admitting: Cardiovascular Disease

## 2012-12-16 DIAGNOSIS — R0989 Other specified symptoms and signs involving the circulatory and respiratory systems: Secondary | ICD-10-CM | POA: Insufficient documentation

## 2012-12-16 DIAGNOSIS — I2510 Atherosclerotic heart disease of native coronary artery without angina pectoris: Secondary | ICD-10-CM | POA: Insufficient documentation

## 2012-12-16 DIAGNOSIS — I359 Nonrheumatic aortic valve disorder, unspecified: Secondary | ICD-10-CM

## 2012-12-16 DIAGNOSIS — Z7901 Long term (current) use of anticoagulants: Secondary | ICD-10-CM | POA: Insufficient documentation

## 2012-12-16 DIAGNOSIS — R0609 Other forms of dyspnea: Secondary | ICD-10-CM | POA: Insufficient documentation

## 2012-12-16 DIAGNOSIS — I2789 Other specified pulmonary heart diseases: Secondary | ICD-10-CM | POA: Insufficient documentation

## 2012-12-16 DIAGNOSIS — Z7902 Long term (current) use of antithrombotics/antiplatelets: Secondary | ICD-10-CM | POA: Insufficient documentation

## 2012-12-16 DIAGNOSIS — Z79899 Other long term (current) drug therapy: Secondary | ICD-10-CM | POA: Insufficient documentation

## 2012-12-16 HISTORY — PX: CARDIAC CATHETERIZATION: SHX172

## 2012-12-16 LAB — POCT I-STAT 3, ART BLOOD GAS (G3+)
pCO2 arterial: 40.6 mmHg (ref 35.0–45.0)
pH, Arterial: 7.417 (ref 7.350–7.450)

## 2012-12-16 LAB — POCT I-STAT 3, VENOUS BLOOD GAS (G3P V)
pCO2, Ven: 48.3 mmHg (ref 45.0–50.0)
pH, Ven: 7.332 — ABNORMAL HIGH (ref 7.250–7.300)
pO2, Ven: 35 mmHg (ref 30.0–45.0)

## 2012-12-16 LAB — POCT I-STAT GLUCOSE
Glucose, Bld: 143 mg/dL — ABNORMAL HIGH (ref 70–99)
Operator id: 194801

## 2012-12-16 SURGERY — JV LEFT AND RIGHT HEART CATHETERIZATION WITH CORONARY ANGIOGRAM

## 2012-12-16 MED ORDER — SODIUM CHLORIDE 0.9 % IJ SOLN: 3.0000 mL | INTRAMUSCULAR | Status: DC | PRN

## 2012-12-16 MED ORDER — ASPIRIN 81 MG PO CHEW
324.0000 mg | CHEWABLE_TABLET | ORAL | Status: AC
  Administered 2012-12-16: 324 mg via ORAL

## 2012-12-16 MED ORDER — ONDANSETRON HCL 4 MG/2ML IJ SOLN: 4.0000 mg | Freq: Four times a day (QID) | INTRAMUSCULAR | Status: DC | PRN

## 2012-12-16 MED ORDER — SODIUM CHLORIDE 0.9 % IV SOLN
INTRAVENOUS | Status: DC
  Administered 2012-12-16: 09:00:00 via INTRAVENOUS

## 2012-12-16 MED ORDER — SODIUM CHLORIDE 0.9 % IV SOLN: 250.0000 mL | INTRAVENOUS | Status: DC | PRN

## 2012-12-16 MED ORDER — SODIUM CHLORIDE 0.9 % IJ SOLN: 3.0000 mL | Freq: Two times a day (BID) | INTRAMUSCULAR | Status: DC

## 2012-12-16 MED ORDER — SODIUM CHLORIDE 0.9 % IV SOLN: 1.0000 mL/kg/h | INTRAVENOUS | Status: DC

## 2012-12-16 MED ORDER — DIAZEPAM 5 MG PO TABS
5.0000 mg | ORAL_TABLET | ORAL | Status: AC
  Administered 2012-12-16: 5 mg via ORAL

## 2012-12-16 MED ORDER — ACETAMINOPHEN 325 MG PO TABS: 650.0000 mg | ORAL_TABLET | ORAL | Status: DC | PRN

## 2012-12-16 NOTE — H&P (View-Only) (Signed)
 HPI:  79-year-old gentleman presenting for evaluation of severe aortic stenosis. He has been followed for years by Dr. Taylor. He has long-standing severe aortic stenosis, previously asymptomatic but now with exertional dyspnea. Surgical consultation has been recommended but the patient has not been willing to pursue this. He was amenable to consideration of less invasive therapies and presents today to explore other options, including TAVR.  Over the last year the patient has developed symptoms of exertional shortness of breath. This occurs with walking a moderate distance or doing physical work. When he uses his chainsaw he has to rest frequently. He denies exertional chest tightness or pain. He denies lightheadedness, palpitations, edema, orthopnea, or PND. 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 He remains physically active. The patient lives at home with his wife who is limited by osteoarthritis. The patient has no history of stroke or TIA. He's had no major surgeries.  Outpatient Encounter Prescriptions as of 12/07/2012  Medication Sig Dispense Refill  . B Complex Vitamins (VITAMIN B-COMPLEX PO) Take by mouth. 50 mcg 1 tab PO      . cholecalciferol (VITAMIN D) 1000 UNITS tablet Take 1,000 Units by mouth 2 (two) times daily.        . Cyanocobalamin (VITAMIN B-12 CR) 1500 MCG TBCR Take 1 tablet by mouth daily.        . digoxin (LANOXIN) 0.25 MG tablet Take 250 mcg by mouth daily.        . doxazosin (CARDURA) 8 MG tablet Take 8 mg by mouth at bedtime.        . glipiZIDE (GLUCOTROL) 5 MG tablet Take 5 mg by mouth 2 (two) times daily before a meal.        . lisinopril-hydrochlorothiazide (PRINZIDE,ZESTORETIC) 20-12.5 MG per tablet Take 2 tablets by mouth daily.       . magnesium gluconate (MAGONATE) 500 MG tablet Take 500 mg by mouth 2 (two) times daily.        . metFORMIN (GLUCOPHAGE) 1000 MG tablet Take 1,000 mg by mouth 2 (two) times daily with a meal.        .  metoprolol tartrate (LOPRESSOR) 25 MG tablet TAKE ONE-HALF TABLET BY MOUTH TWICE DAILY  90 tablet  3  . RA POTASSIUM GLUCONATE PO Take 595 mg by mouth 2 (two) times daily.       . Selenium 200 MCG CAPS Take 1 capsule by mouth daily.        . simvastatin (ZOCOR) 40 MG tablet Take 40 mg by mouth at bedtime.        . vitamin A (BL VITAMIN A) 8000 UNIT capsule Take 8,000 Units by mouth daily.        . vitamin C (ASCORBIC ACID) 500 MG tablet Take 500 mg by mouth 2 (two) times daily.        . warfarin (COUMADIN) 7.5 MG tablet Take 7.5 mg by mouth as directed.        . zinc gluconate 50 MG tablet Take 50 mg by mouth daily.        . [DISCONTINUED] hydrochlorothiazide (MICROZIDE) 12.5 MG capsule Take 12.5 mg by mouth daily.        . [DISCONTINUED] NON FORMULARY Testerone injection (1 Shot)  every two weeks.        Review of patient's allergies indicates no known allergies.  Past Medical History  Diagnosis Date  . Atrial fibrillation   . Aortic stenosis        moderate to severe with valve area 0.95  . HTN (hypertension)   . Bradycardia     Past Surgical History  Procedure Date  . Transthoracic echocardiogram 09/04/10, 09/07/08    History   Social History  . Marital Status: Married    Spouse Name: N/A    Number of Children: N/A  . Years of Education: N/A   Occupational History  . Not on file.   Social History Main Topics  . Smoking status: Former Smoker    Quit date: 09/11/1976  . Smokeless tobacco: Not on file  . Alcohol Use: Not on file  . Drug Use: Not on file  . Sexually Active: Not on file   Other Topics Concern  . Not on file   Social History Narrative  . No narrative on file   family history: The patient's father had cardiac disease but this was poorly defined. He never had bypass surgery or stenting.  ROS:  General: no fevers/chills/night sweats Eyes: no blurry vision, diplopia, or amaurosis ENT: no sore throat or hearing loss Resp: no cough, wheezing, or  hemoptysis CV: no edema or palpitations GI: no abdominal pain, nausea, vomiting, diarrhea, or constipation GU: no dysuria, frequency, or hematuria Skin: no rash Neuro: no headache. Positive for bilateral foot numbness that he relates to neuropathy. Musculoskeletal: Positive for wrist pain Heme: no bleeding, DVT, or easy bruising Endo: no polydipsia or polyuria  BP 172/72  Pulse 60  Ht 5' 8" (1.727 m)  Wt 85.73 kg (189 lb)  BMI 28.74 kg/m2  PHYSICAL EXAM: Pt is alert and oriented, WD, WN, in no distress. HEENT: normal Neck: JVP normal. Carotid upstrokes normal with bilateral bruits. No thyromegaly. Lungs: equal expansion, clear bilaterally CV: Apex is discrete and nondisplaced, RRR with grade 3/6 crescendo decrescendo murmur at the carina upper sternal border, no diastolic murmur.  Abd: soft, NT, +BS, no bruit, no hepatosplenomegaly Back: no CVA tenderness Ext: no C/C/E        Femoral pulses 2+= without bruits        DP/PT pulses intact and = Skin: warm and dry without rash Neuro: CNII-XII intact             Strength intact = bilaterally  2D Echo: Left ventricle: The cavity size was mildly dilated. Wall thickness was increased in a pattern of mild LVH. Systolic function was normal. The estimated ejection fraction was in the range of 55% to 60%.  ------------------------------------------------------------ Aortic valve: The aortic valve gradient may be overestimated in the presence of aortic insufficiency. Moderately thickened, moderately calcified leaflets. Doppler: There was severe stenosis. Mild regurgitation. VTI ratio of LVOT to aortic valve: 0.21. Indexed valve area: 0.24cm^2/m^2 (VTI). Peak velocity ratio of LVOT to aortic valve: 0.18. Indexed valve area: 0.2cm^2/m^2 (Vmax). Mean gradient: 66mm Hg (S). Peak gradient: 119mm Hg (S).  ------------------------------------------------------------ Aorta: Aortic root: The aortic root was normal in size. Ascending aorta:  The ascending aorta was normal in size.  ------------------------------------------------------------ Mitral valve: Mildly thickened leaflets . Doppler: Trivial regurgitation.  ------------------------------------------------------------ Left atrium: The atrium was moderately to severely dilated.  ------------------------------------------------------------ Right ventricle: The cavity size was mildly dilated. Systolic function was mildly reduced.  ------------------------------------------------------------ Pulmonic valve: Doppler: No significant regurgitation.  ------------------------------------------------------------ Tricuspid valve: Doppler: Mild regurgitation.  ------------------------------------------------------------ Pulmonary artery: Systolic pressure was moderately increased.  ------------------------------------------------------------ Right atrium: The atrium was severely dilated.  ------------------------------------------------------------ Pericardium: There was no pericardial effusion.  ------------------------------------------------------------ Systemic veins: Inferior vena cava: The vessel was dilated; the respirophasic diameter changes   were blunted (< 50%); findings are consistent with elevated central venous pressure.  ------------------------------------------------------------  2D measurements Normal Doppler measurements Norma Left ventricle l LVID ED, 54.3 mm 43-52 Main pulmonary artery chord, Pressure, 55 mm Hg =30 PLAX S LVID ES, 31.8 mm 23-38 LVOT chord, Peak vel, 97 cm/s ----- PLAX S FS, chord, 41 % >29 VTI, S 27. cm ----- PLAX 7 LVPW, ED 12.7 mm ------ Aortic valve IVS/LVPW 0.99 <1.3 Peak vel, 545 cm/s ----- ratio, ED S Ventricular septum Mean vel, 367 cm/s ----- IVS, ED 12.6 mm ------ S LVOT VTI, S 132 cm ----- Diam, S 17 mm ------ Mean 66 mm Hg ----- Area 2.27 cm^2 ------ gradient, Aorta S Root diam, 26 mm ------ Peak 119 mm Hg  ----- ED gradient, Left atrium S AP dim 57 mm ------ VTI ratio 0.2 ----- AP dim 2.84 cm/m^2 <2.2 LVOT/AV 1 index Area index 0.2 cm^2/m^2 ----- (VTI) 4 Peak vel 0.1 ----- ratio, 8 LVOT/AV Area index 0.2 cm^2/m^2 ----- (Vmax) Mitral valve Max regurg 731 cm/s ----- vel Regurg VTI 238 cm ----- Tricuspid valve Regurg 352 cm/s ----- peak vel Peak RV-RA 50 mm Hg ----- gradient, S Systemic veins Estimated 5 mm Hg ----- CVP Right ventricle Pressure, 55 mm Hg <30 S  STS Risk Calculator: Procedure Name Isolated AVRepl Risk of Mortality 2.322% Morbidity or Mortality 15.740% Long Length of Stay 5.962% Short Length of Stay 35.877% Permanent Stroke 1.300% Prolonged Ventilation 8.730% DSW Infection 0.254% Renal Failure 4.347% Reoperation 7.180%   ASSESSMENT AND PLAN: 1. Severe symptomatic aortic stenosis. I have reviewed the patient's echocardiogram images. His exam and echo are both consistent with severe AS. We discussed the natural history of aortic stenosis as well as the potential treatment options. He understands that he is now symptomatic, and aortic valve replacement is therefore recommended. He also understands his risk of developing complications related to aortic stenosis, including progressive dyspnea, heart failure, and cardiac death.  He does not want to have open surgical aortic valve replacement. However, his risk is too low to be considered for TAVR. He is not even high enough risk to be considered for a "moderate risk" TAVR clinical trial. I have recommended that we proceed with right and left heart catheterization to evaluate for coronary artery disease and the findings hemodynamics. He appears at least willing to consider surgical AVR if necessary.  2. Permanent atrial fibrillation. The patient will hold warfarin 5 days before his heart catheterization. Will resume it following the procedure if no complications. I will not do any bridging with Lovenox as he said no history of  stroke or TIA.  [name] 12/07/2012 11:34 AM     

## 2012-12-16 NOTE — Progress Notes (Signed)
Bedrest begins @ 1200, tegaderm dressing applied to right groin site by Army Melia RN, site level 0.

## 2012-12-16 NOTE — Interval H&P Note (Signed)
History and Physical Interval Note:  12/16/2012 11:04 AM  [name]  has presented today for surgery, with the diagnosis of cp  The various methods of treatment have been discussed with the patient and family. After consideration of risks, benefits and other options for treatment, the patient has consented to  Procedure(s): JV LEFT AND RIGHT HEART CATHETERIZATION WITH CORONARY ANGIOGRAM (N/A) as a surgical intervention .  The patient's history has been reviewed, patient examined, no change in status, stable for surgery.  I have reviewed the patient's chart and labs.  Questions were answered to the patient's satisfaction.     Tonny Bollman

## 2012-12-16 NOTE — CV Procedure (Signed)
   Cardiac Catheterization Procedure Note  Name: [name] MRN: [id number] DOB: January 30, 1933  Procedure: Right Heart Cath, Left Heart Cath, Selective Coronary Angiography, LV angiography  Indication: Severe aortic stenosis, symptomatic   Procedural Details: The right groin was prepped, draped, and anesthetized with 1% lidocaine. Using the modified Seldinger technique a 4 French sheath was placed in the right femoral artery and a 7 French sheath was placed in the right femoral vein. A [name]-Ganz catheter was used for the right heart catheterization. Standard protocol was followed for recording of right heart pressures and sampling of oxygen saturations. Fick cardiac output was calculated. Standard Judkins catheters were used for selective coronary angiography and left ventriculography. A straight wire directed with an AL-1 catheter was used to cross the aortic valve. A pigtail was inserted over an exchange-length J-wire. There were no immediate procedural complications. The patient was transferred to the post catheterization recovery area for further monitoring.  Procedural Findings: Hemodynamics RA 13 RV 64/8 PA 69/20 mean 41 PCWP 21 LV 216/22 AO 151/58 mean 89  Oxygen saturations: PA 62 AO 95  Ao valve hemodynamics: Mean gradient: 51 mmHg, Ao valve area 0.4 square cm  Cardiac Output (Fick) 3.7  Cardiac Index (Fick) 1.8   Coronary angiography: Coronary dominance: right  Left mainstem: patent, calcified, no obstructive disease  Left anterior descending (LAD): Diffuse irregularity, mildly calcified, reaches the LV apex.30-40% stenosis at the diagonal origin. Diags are widely patent. No significant obstructive disease throughout the LAD distribution.  Left circumflex (LCx): Patent throughout. Intermediate branch and OM1 have minor nonobstructive disease without significant stenosis.  Right coronary artery (RCA): Large, dominant vessel. PDA and PLA branches are patent without  stenosis.The mid-vessel has 20-30% stenosis.  Aortic valve: severe calcification throughout the valve leaflets.   Left ventriculography: Left ventricular systolic function is normal, LVEF is low-normal with LVEF estimated at 55%, there is no significant mitral regurgitation   Ao root angiography: Mild AI, heavily calcified ascending aorta  Final Conclusions:   1. Nonobstructive CAD as outlined above 2. Low-normal LV systolic function 3. Critical aortic stenosis 4. Pulmonary hypertension, likely related to chronic left heart disease.  Recommendations: Surgical evaluation for aortic valve replacement. He has been very reluctant to pursue surgical AVR, but surgical risk is not high enough to consider TAVR. 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 Will refer him to Dr Tyrone Sage who operated on the patient's wife in the past.  Tonny Bollman 12/16/2012, 12:03 PM

## 2012-12-18 ENCOUNTER — Other Ambulatory Visit: Payer: Self-pay | Admitting: *Deleted

## 2012-12-18 DIAGNOSIS — I359 Nonrheumatic aortic valve disorder, unspecified: Secondary | ICD-10-CM

## 2012-12-21 ENCOUNTER — Encounter: Payer: Self-pay | Admitting: *Deleted

## 2012-12-21 ENCOUNTER — Encounter: Payer: Medicare Other | Admitting: Cardiothoracic Surgery

## 2012-12-21 ENCOUNTER — Encounter: Payer: Self-pay | Admitting: Cardiothoracic Surgery

## 2012-12-21 ENCOUNTER — Institutional Professional Consult (permissible substitution) (INDEPENDENT_AMBULATORY_CARE_PROVIDER_SITE_OTHER): Payer: Medicare Other | Admitting: Cardiothoracic Surgery

## 2012-12-21 ENCOUNTER — Other Ambulatory Visit: Payer: Self-pay

## 2012-12-21 ENCOUNTER — Other Ambulatory Visit: Payer: Self-pay | Admitting: *Deleted

## 2012-12-21 VITALS — BP 177/75 | HR 63 | Resp 16 | Ht 68.5 in | Wt 185.0 lb

## 2012-12-21 DIAGNOSIS — I359 Nonrheumatic aortic valve disorder, unspecified: Secondary | ICD-10-CM

## 2012-12-21 DIAGNOSIS — Z5181 Encounter for therapeutic drug level monitoring: Secondary | ICD-10-CM

## 2012-12-21 DIAGNOSIS — E119 Type 2 diabetes mellitus without complications: Secondary | ICD-10-CM | POA: Insufficient documentation

## 2012-12-21 DIAGNOSIS — E785 Hyperlipidemia, unspecified: Secondary | ICD-10-CM

## 2012-12-21 DIAGNOSIS — Z7901 Long term (current) use of anticoagulants: Secondary | ICD-10-CM

## 2012-12-21 DIAGNOSIS — Z8546 Personal history of malignant neoplasm of prostate: Secondary | ICD-10-CM | POA: Insufficient documentation

## 2012-12-21 DIAGNOSIS — I350 Nonrheumatic aortic (valve) stenosis: Secondary | ICD-10-CM

## 2012-12-21 HISTORY — DX: Hyperlipidemia, unspecified: E78.5

## 2012-12-21 HISTORY — DX: Long term (current) use of anticoagulants: Z79.01

## 2012-12-21 NOTE — Progress Notes (Deleted)
c 

## 2012-12-21 NOTE — Patient Instructions (Addendum)
CAT scan of Chest Plan surgery Feb24 Stop coumadin feb18 Stop lisinopril and metformin Saturday  feb22    Aortic Stenosis Aortic stenosis, or aortic valve stenosis, is a narrowing of the aortic valve. When the aortic valve is narrowed, the valve does not open and close very well. This restricts blood flow between the left side of the heart and the aorta (the large artery which takes blood to the rest of the body). This restriction makes it hard for your heart to pump blood. This extra work can weaken your heart and can lead to heart failure. CAUSES  Causes of aortic valve stenosis can vary. Some of these can include:  Calcium deposits on the aortic valve. Calcium can buildup on the aortic valve and make it stiff. This cause of aortic stenosis is most common in people over the age of 56.  Congenital heart defect. This can occur during the development of the fetus and can result in an aortic valve defect.  Rheumatic fever. Rheumatic fever is a bacterial infection that can develop from a strep throat infection. The bacteria from rheumatic fever can attach themselves to the valve. This can cause scarring on the aortic valve, causing it to become narrow. SYMPTOMS  Symptoms of aortic valve stenosis develop when the valve disease is severe. Symptoms can include:  Shortness of breath, especially with physical activity.  Feeling tired (fatigue).  Chest pain (angina) or tightness.  Feeing your heart race or beat funny (heart palpitations).  Dizziness or fainting. DIAGNOSIS  Aortic stenosis is diagnosed through:  A physical exam and symptoms.  A heart murmur.  Echocardiography. This test uses sound waves to produce images of your heart. TREATMENT   Surgery is the treatment for aortic valve stenosis.  Surgery may not be needed right away. Surgery is necessary when narrowing of the aortic valve becomes severe, and symptoms develop or become worse.  Medications cannot reverse aortic  valve stenosis. HOME CARE INSTRUCTIONS   If you have aortic stenosis, you many need to avoid strenuous physical activity. Talk with your caregiver about what types of activities you should avoid.  If you are a woman with aortic valve stenosis and are of child-bearing age, talk to your caregiver before you become pregnant.  If you become pregnant, you will need to be monitored by your obstetrician and cardiologist throughout your pregnancy, labor and delivery, and after delivery. SEEK IMMEDIATE MEDICAL CARE IF:  You develop chest pain or tightness.  You develop shortness of breath or difficulty breathing.  You develop lightheadedness or fainting.  You have heart palpitations or skipped heartbeats. Document Released: 07/20/2003 Document Revised: 01/13/2012 Document Reviewed: 02/27/2010 [facility] Patient Information 2013 [place], [place]. Aortic Valve Replacement You have a disease of one of the valves of your heart. In you or your child's case, it is the aortic valve which needs replacing. Aortic valve replacement is open heart surgery done by a heart surgeon. This operation treats problems with the aortic valve. The aortic valve is the "outflow valve" for the left side of the heart. The left side of your heart (left ventricle) is the large muscular part of the heart that pumps blood to the rest of the body. It separates the left ventricle from the aorta. When the heart squeezes down (contracts), the aortic valve is what keeps the blood from flowing back into the ventricle from the aorta. This allows the blood to keep moving through the body.  Surgery may be necessary when the valve does not open  or close completely. A stenotic (narrow) valve does not let the blood leave the heart normally. This causes blood to back up in the left ventricle. This makes it hard for the heart to increase the amount of blood that it pumps. The heart has to work harder. This may produce shortness of breath and fatigue.  Problems are worse with activity.  If the valve leaflets do not meet correctly when closing, blood may leak backward into the ventricle each time the heart pumps. This is called aortic insufficiency. When some of the blood leaks backwards, the heart has to work even harder. The heart can allow for this over-work for a long time if the leakage came on slowly. Eventually, the heart fails.  Aortic valve problems may be caused by a birth defect. This is called congenital. Wear and tear can cause valves to fail. More commonly, rheumatic fever may damage the aortic valve. Occasionally, the valve may be damaged by infection. This also causes the aortic valve to leak.  DESCRIPTION OF SURGERY Aortic valves can be repaired. When the valve is too damaged to repair, the valve must be replaced. A prosthetic (artificial) valve is used to do this. Valves damaged by rheumatic disease often must be replaced.  Two types of artificial valves are available:  Mechanical valves made entirely from man-made materials.  Biological valves which are made from animal tissues or taken from a cadaver. Each has advantages and disadvantages. The choice of which type to use should be made by you and your surgeon. Your risks, age, lifestyle, other medical problems including the decision on whether to be on blood thinners the rest of your life all will help you decide on which type of valve to use. There are a number of good MECHANICAL PROSTHESES available. All work well. The main advantage of mechanical valves is that they do not wear out. Their main disadvantage is that blood clots easier on mechanical valves. If this happens the valve will not work normally. Because of this, patients with mechanical valves must take anticoagulants (blood thinners) for life. There is also a small but definite risk of blood clots causing stroke, even when taking anticoagulants.  There are a number of BIOLOGICAL CHOICES for aortic valve replacement. Most  are made from pig aortic valves. Some are taken from cadavers. The main advantage is that they have a reduced risk of blood clots forming on the valve. This lessens the chance of the valve not working or causing a stroke. A large disadvantage of biological or tissue valves is that they wear out sooner than mechanical valves. The rate at which they wear out depends on the patient's age. A young boy might wear out such a valve in only a few years. The same valve might last 10 years in a middle aged person, and even longer in a patient over the age of 75. A tissue valve used in a person over 8 years old may never need replacement. RISKS AND COMPLICATIONS Your cardiologist and cardiothoracic surgeon can best determine your individual risk. It will depend on your age, general condition, medical conditions, and your heart function. In general, the risks include:  Problems from the operation itself are low risk. Some common risks are:  Risks from the anesthesia.  Bleeding and infection.  Lifelong treatment with medications to prevent blood clots is needed for mechanical valve replacements.  Infection is more common with valve replacement than with valve repair.   PROCEDURE  Valve repair or replacement is  open-heart surgery. You are given general anesthesia (medications to help you sleep). You are then placed on a heart-lung machine. This machine provides oxygen to your blood while the heart is not working. The surgery generally lasts from 3 to 5 hours. During surgery, the surgeon makes a large incision (cut) in the chest. Sometimes the heart is cooled to slow or stop the heartbeat. The damaged aortic valve is either repaired or removed and replaced with an artificial heart valve. AFTER THE PROCEDURE  Recovery from heart valve surgery usually involves a few days in an intensive care unit (ICU) of a hospital. Full recovery from heart valve surgery can take several months.  Anticoagulation (blood  thinning) treatment with warfarin is often prescribed for 6 weeks to 3 months after surgery for those with biological valves. It is prescribed for life for those with mechanical valves.  Recovery includes healing of the surgical incision. There is a gradual building of stamina and exercise abilities. An exercise program under the direction of a physical therapist may be recommended.  Once you have an artificial valve, your heart function and your life will return to normal. You usually feel better after surgery. Shortness of breath and fatigue should lessen. If your heart was already severely damaged before your surgery, you may continue to have problems.  You can usually resume most of your normal activities. You will have to continue to monitor your condition. You need to watch out for blood clots and infections.  Artificial valves need to be replaced after a period of time. It is important that you see your caregiver regularly.  Some individuals with an aortic valve replacement need to take antibiotics before having dental work or other surgical procedures. This is called prophylactic antibiotic treatment. These drugs help to prevent infective endocarditis. Antibiotics are only recommended for individuals with the highest risk for developing infective endocarditis. Let your dentist and your caregiver know if you have a history of any of the following so that the necessary precautions can be taken:  A VSD.  A repaired VSD.  Endocarditis in the past.  An artificial (prosthetic) heart valve. HOME CARE INSTRUCTIONS   Use all medications as prescribed.  Take your temperature every morning for the first week after surgery. Record these.  Weigh yourself every morning for at least the first week after surgery and record.  Do not lift more than 10 pounds (4.5 kg) until your sternum (breastbone) has healed. Avoid all activities which would place strain on your incision.  You may shower but do  not take baths until instructed by your caregivers.  Avoid driving for 4 to 6 weeks following surgery or as instructed.  Use your elastic stockings during the day. You should wear the stockings for at least 2 weeks after discharge or longer if your ankles are swollen. The stockings help blood flow and help reduce swelling in the legs. It is easiest to put the stockings on before you get out of bed in the morning. They should fit snugly. SEEK IMMEDIATE MEDICAL CARE IF:  You develop chest pain which is not coming from your incision (surgical cut) .  You develop shortness of breath.  You develop a temperature over 101 F (38.3 C).  You have a sudden weight gain. Let your caregiver know what the weight gain is. Document Released: 03/12/2005 Document Revised: 01/13/2012 Document Reviewed: 10/17/2008 [facility] Patient Information 2013 [place], [place]. Aortic Valve Replacement Care After Read the instructions outlined below and refer to this sheet  for the next few weeks. These discharge instructions provide you with general information on caring for yourself after you leave the hospital. Your surgeon may also give you specific instructions. While your treatment has been planned according to the most current medical practices available, unavoidable complications occasionally occur. If you have any problems or questions after discharge, please call your surgeon. AFTER THE PROCEDURE  Full recovery from heart valve surgery can take several months.  Blood thinning (anticoagulation) treatment with warfarin is often prescribed for 6 weeks to 3 months after surgery for those with biological valves. It is prescribed for life for those with mechanical valves.  Recovery includes healing of the surgical incision. There is a gradual building of stamina and exercise abilities. An exercise program under the direction of a physical therapist may be recommended.  Once you have an artificial valve, your heart  function and your life will return to normal. You usually feel better after surgery. Shortness of breath and fatigue should lessen. If your heart was already severely damaged before your surgery, you may continue to have problems.  You can usually resume most of your normal activities. You will have to continue to monitor your condition. You need to watch out for blood clots and infections.  Artificial valves need to be replaced after a period of time. It is important that you see your caregiver regularly.  Some individuals with an aortic valve replacement need to take antibiotics before having dental work or other surgical procedures. This is called prophylactic antibiotic treatment. These drugs help to prevent infective endocarditis. Antibiotics are only recommended for individuals with the highest risk for developing infective endocarditis. Let your dentist and your caregiver know if you have a history of any of the following so that the necessary precautions can be taken:  A VSD.  A repaired VSD.  Endocarditis in the past.  An artificial (prosthetic) heart valve. HOME CARE INSTRUCTIONS   Use all medications as prescribed.  Take your temperature every morning for the first week after surgery. Record these.  Weigh yourself every morning for at least the first week after surgery and record.  Do not lift more than 10 pounds (4.5 kg) until your breastbone (sternum) has healed. Avoid all activities which would place strain on your incision.  You may shower as soon as directed by your caregiver after surgery. Pat incisions dry. Do not rub incisions with washcloth or towel.  Avoid driving for 4 to 6 weeks following surgery or as instructed.  Use your elastic stockings during the day. You should wear the stockings for at least 2 weeks after discharge or longer if your ankles are swollen. The stockings help blood flow and help reduce swelling in the legs. It is easiest to put the stockings on  before you get out of bed in the morning. They should fit snugly. Pain Control  If a prescription was given for a pain reliever, please follow your doctor's directions.  If the pain is not relieved by your medicine, becomes worse, or you have difficulty breathing, call your surgeon. Activity  Take frequent rest periods throughout the day.  Wait one week before returning to strenuous activities such as heavy lifting (more than 10 pounds), pushing or pulling.  Talk with your doctor about when you may return to work and your exercise routine.  Do not drive while taking prescription pain medication. Nutrition  You may resume your normal diet.  Drink plenty of fluids (6-8 glasses a day).  Eat a  well-balanced diet.  Call your caregiver for persistent nausea or vomiting. Elimination Your normal bowel function should return. If constipation should occur, you may:  Take a mild laxative.  Add fruit and bran to your diet.  Drink more fluids.  Call your doctor if constipation is not relieved. SEEK IMMEDIATE MEDICAL CARE IF:   You develop chest pain which is not coming from your surgical cut (incision).  You develop shortness of breath or have difficulty breathing.  You develop a temperature over 101 F (38.3 C).  You have a sudden weight gain. Let your caregiver know what the weight gain is.  You develop a rash.  You develop any reaction or side effects to medications given.  You have increased bleeding from wounds.  You see redness, swelling, or have increasing pain in wounds.  You have pus coming from your wound.  You develop lightheadedness or feel faint. Document Released: 05/09/2005 Document Revised: 01/13/2012 Document Reviewed: 07/31/2005 [facility] Patient Information 2013 [place], [place].

## 2012-12-21 NOTE — Progress Notes (Addendum)
[address]            [place] [address]          [phone]      [name] [facility] Medical Record #[id number] Date of Birth: 11-26-1932  Referring: Tonny Bollman, MD/ Sharrell Ku MD Primary Care: Pamelia Hoit, MD  Chief Complaint:    Chief Complaint  Patient presents with  . Shortness of Breath    eval for surgery...2D Echo, Cardiac Cath  . Aortic Stenosis    History of Present Illness:    Patient is a 79 year old male who has been followed since 2009 for aortic stenosis and history of atrial fibrillation for approximately 10 years or more. The patient has noted increasing shortness of breath with exertion, he also notes palpitations with atrial fibrillation. He denies chest pain resting shortness of breath syncope presyncope or orthopnea he denies lower extremity edema. He notes that the symptoms of exertional shortness of breath have become worse.  Echocardiogram in 2009 showed aortic valve area of 0.93 peak gradient of 63 mm mean gradient 33, 2011 peak velocity was 486 cm/s peak gradient 94 mm mean gradient 53. These findings have progressed an echocardiogram fall of 2013 with a peak velocity of 545 cm/s, peak gradient 119 mm and mean gradient 66 mm.   Cardiac catheterization was performed as an outpatient and the patient has been referred for aortic valve replacement.  Current Activity/ Functional Status:  Patient is independent with mobility/ambulation, transfers, ADL's, IADL's.  Zubrod Score: At the time of surgery this patient's most appropriate activity status/level should be described as: []  Normal activity, no symptoms [x]  Symptoms, fully ambulatory []  Symptoms, in bed less than or equal to 50% of the time []  Symptoms, in bed greater than 50% of the time but less than 100% []  Bedridden []  Moribund   Past Medical History  Diagnosis Date  . Atrial fibrillation   . Aortic stenosis      moderate to severe  with valve area 0.95  . HTN (hypertension)   . Bradycardia   . Atrial fibrillation   . Arthritis     left wrist  . Diabetes mellitus 12/21/2012  . Hyperlipemia 12/21/2012  . H/O prostate cancer 12/21/2012  . Anticoagulated on Coumadin 12/21/2012    Past Surgical History  Procedure Laterality Date  . Transthoracic echocardiogram  09/04/10, 09/07/08  . Fracture surgery      left lower arm    Family History  Problem Relation Age of Onset  . Diabetes Mother   . Cancer Mother 59    bone,melanoma  . Cancer Father 16    pancreatic  . Heart disease Father   . Stroke Sister   . Cancer Brother 20    ? brain cancer    History   Social History  . Marital Status: Married    Spouse Name: N/A    Number of Children: 2 living  . Years of Education: N/A   Occupational History  . Retired Programmer, systems   Social History Main Topics  . Smoking status: Former Smoker -- 2.00 packs/day    Types: Cigarettes    Quit date: 09/11/1976  . Smokeless tobacco: Not on file  . Alcohol Use: No  . Drug Use: Not   .     Other Topics Concern  . Not on file   Social History Narrative  .  No narrative on file    History  Smoking status  . Former Smoker -- 2.00 packs/day  . Types: Cigarettes  . Quit date: 09/11/1976  Smokeless tobacco  . Not on file    History  Alcohol Use No     No Known Allergies  Current Outpatient Prescriptions  Medication Sig Dispense Refill  . B Complex Vitamins (VITAMIN B-COMPLEX PO) Take by mouth. 50 mcg 1 tab PO      . cholecalciferol (VITAMIN D) 1000 UNITS tablet Take 1,000 Units by mouth 2 (two) times daily.        . Cyanocobalamin (VITAMIN B-12 CR) 1500 MCG TBCR Take 1 tablet by mouth daily.        . digoxin (LANOXIN) 0.25 MG tablet Take 250 mcg by mouth daily.        Marland Kitchen doxazosin (CARDURA) 8 MG tablet Take 8 mg by mouth at bedtime.        Marland Kitchen glipiZIDE (GLUCOTROL) 5 MG tablet Take 5 mg by mouth 2 (two) times daily before a meal.        .  lisinopril-hydrochlorothiazide (PRINZIDE,ZESTORETIC) 20-12.5 MG per tablet Take 2 tablets by mouth daily.       . magnesium gluconate (MAGONATE) 500 MG tablet Take 500 mg by mouth 2 (two) times daily.        . metFORMIN (GLUCOPHAGE) 1000 MG tablet Take 1,000 mg by mouth 2 (two) times daily with a meal.        . metoprolol tartrate (LOPRESSOR) 25 MG tablet TAKE ONE-HALF TABLET BY MOUTH TWICE DAILY  90 tablet  3  . RA POTASSIUM GLUCONATE PO Take 595 mg by mouth 2 (two) times daily.       . Selenium 200 MCG CAPS Take 1 capsule by mouth daily.        . simvastatin (ZOCOR) 40 MG tablet Take 40 mg by mouth at bedtime.        . vitamin A (BL VITAMIN A) 8000 UNIT capsule Take 8,000 Units by mouth daily.        . vitamin C (ASCORBIC ACID) 500 MG tablet Take 500 mg by mouth 2 (two) times daily.        Marland Kitchen warfarin (COUMADIN) 7.5 MG tablet Take 7.5 mg by mouth as directed.        . zinc gluconate 50 MG tablet Take 50 mg by mouth daily.         No current facility-administered medications for this visit.       Review of Systems:     Cardiac Review of Systems: Y or N  Chest Pain [  n  ]  Resting SOB [n ] Exertional SOB  Cove.Etienne  ]  Orthopnea [ n ]   Pedal Edema [ n  ]    Palpitations Cove.Etienne  ] Syncope  [  n]   Presyncope [  n ]  General Review of Systems: [Y] = yes [  ]=no Constitional: recent weight change [ n ]; anorexia [  n]; fatigue [ y ]; nausea [  ]; night sweats [n  ]; fever [ n]; or chills [n  ];  Dental: poor dentition[  n]; Last Dentist visit:   Eye : blurred vision [  ]; diplopia [   ]; vision changes [  ];  Amaurosis fugax[  ]; Resp: cough [  ];  wheezing[  ];  hemoptysis[  ]; shortness of breath[  ]; paroxysmal nocturnal dyspnea[  ]; dyspnea on exertion[  ]; or orthopnea[  ];  GI:  gallstones[  ], vomiting[  ];  dysphagia[  ]; melena[  ];  hematochezia [  ];  heartburn[  ];   Hx of  Colonoscopy[  ]; GU: kidney stones [  ]; hematuria[  ];   dysuria [  ];  nocturia[  ];  history of     obstruction [  ]; urinary frequency [  ]             Skin: rash, swelling[  ];, hair loss[  ];  peripheral edema[  ];  or itching[  ]; Musculosketetal: myalgias[  ];  joint swelling[  ];  joint erythema[  ];  joint pain[  ];  back pain[  ];  Heme/Lymph: bruising[  ];  bleeding[  ];  anemia[  ];  Neuro: TIA[ n ];  headaches[  ];  stroke[  ];  vertigo[  ];  seizures[  ];   paresthesias[  ];  difficulty walking[  ];  Psych:depression[  ]; anxiety[  ];  Endocrine: diabetes[ y ];  thyroid dysfunction[ n ];  Immunizations: Flu Cove.Etienne  ]; Pneumococcal[y ];  Other:  Physical Exam: BP 177/75  Pulse 63  Resp 16  Ht 5' 8.5" (1.74 m)  Wt 185 lb (83.915 kg)  BMI 27.72 kg/m2  SpO2 98%  General appearance: alert, cooperative, appears stated age and no distress Neurologic: intact Heart: irregularly irregular rhythm Lungs: clear to auscultation bilaterally and normal percussion bilaterally Abdomen: soft, non-tender; bowel sounds normal; no masses,  no organomegaly Extremities: extremities normal, atraumatic, no cyanosis or edema and Homans sign is negative, no sign of DVT Wound: Right cath site is significantly bruised but there is no palpable femoral false aneurysm The patient has a harsh holosystolic murmur heard throughout the precordium with a faint diastolic murmur Murmur radiates into both carotids He has palpable pedal pulses bilaterally that are equal  Diagnostic Studies & Laboratory data:     Recent Radiology Findings:   No results found.    Recent Lab Findings: Lab Results  Component Value Date   WBC 6.9 12/15/2012   HGB 15.2 12/15/2012   HCT 45.0 12/15/2012   PLT 125.0* 12/15/2012   GLUCOSE 143* 12/16/2012   NA 135 12/15/2012   K 4.7 12/15/2012   CL 95* 12/15/2012   CREATININE 1.2 12/15/2012   BUN 28* 12/15/2012   CO2 32 12/15/2012   INR 1.3* 12/15/2012    CATH: Procedural Findings:  Hemodynamics  RA 13  RV 64/8  PA 69/20 mean 41  PCWP 21  LV 216/22  AO 151/58 mean 89  Oxygen saturations:  PA 62  AO 95  Ao valve hemodynamics:  Mean gradient: 51 mmHg, Ao valve area 0.4 square cm  Cardiac Output (Fick) 3.7  Cardiac Index (Fick) 1.8  Coronary angiography:  Coronary dominance: right  Left mainstem: patent, calcified, no obstructive disease  Left anterior descending (LAD): Diffuse irregularity, mildly calcified, reaches the LV apex.30-40% stenosis at the diagonal origin. Diags are widely patent. No significant obstructive disease throughout the LAD distribution.  Left circumflex (LCx): Patent throughout. Intermediate branch and OM1 have minor nonobstructive disease  without significant stenosis.  Right coronary artery (RCA): Large, dominant vessel. PDA and PLA branches are patent without stenosis.The mid-vessel has 20-30% stenosis.  Aortic valve: severe calcification throughout the valve leaflets.  Left ventriculography: Left ventricular systolic function is normal, LVEF is low-normal with LVEF estimated at 55%, there is no significant mitral regurgitation  Ao root angiography: Mild AI, heavily calcified ascending aorta  Final Conclusions:  1. Nonobstructive CAD as outlined above  2. Low-normal LV systolic function  3. Critical aortic stenosis  4. Pulmonary hypertension, likely related to chronic left heart disease.  Recommendations: Surgical evaluation for aortic valve replacement. He has been very reluctant to pursue surgical AVR, but surgical risk is not high enough to consider TAVR. 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 Will refer him to Dr Tyrone Sage who operated on the patient's wife in the past.    ECHO: 09/2012 Study Conclusions  - Left ventricle: The cavity size was mildly dilated. Wall thickness was increased in a pattern of mild LVH. Systolic function was  normal. The estimated ejection fraction was in the range of 55% to 60%. - Aortic valve: There was severe stenosis. Mild regurgitation. - Left atrium: The atrium was moderately to severely dilated. - Right ventricle: The cavity size was mildly dilated. Systolic function was mildly reduced. - Right atrium: The atrium was severely dilated. - Pulmonary arteries: Systolic pressure was moderately increased. PA peak pressure: 55mm Hg (S). Transthoracic echocardiography. M-mode, complete 2D, spectral Doppler, and color Doppler. Height: Height: 175.3cm. Height: 69in. Weight: Weight: 84.8kg. Weight: 186.6lb. Body mass index: BMI: 27.6kg/m^2. Body surface area: BSA: 2.60m^2. Blood pressure: 130/70. Patient status: Outpatient. Location: [place] [facility]  ------------------------------------------------------------  ------------------------------------------------------------ Left ventricle: The cavity size was mildly dilated. Wall thickness was increased in a pattern of mild LVH. Systolic function was normal. The estimated ejection fraction was in the range of 55% to 60%.  ------------------------------------------------------------ Aortic valve: The aortic valve gradient may be overestimated in the presence of aortic insufficiency. Moderately thickened, moderately calcified leaflets. Doppler: There was severe stenosis. Mild regurgitation. VTI ratio of LVOT to aortic valve: 0.21. Indexed valve area: 0.24cm^2/m^2 (VTI). Peak velocity ratio of LVOT to aortic valve: 0.18. Indexed valve area: 0.2cm^2/m^2 (Vmax). Mean gradient: 66mm Hg (S). Peak gradient: Hg (S).  ------------------------------------------------------------ Aorta: Aortic root: The aortic root was normal in size. Ascending aorta: The ascending aorta was normal in size.  ------------------------------------------------------------ Mitral valve: Mildly thickened leaflets . Doppler: Trivial  regurgitation.  ------------------------------------------------------------ Left atrium: The atrium was moderately to severely dilated.  ------------------------------------------------------------ Right ventricle: The cavity size was mildly dilated. Systolic function was mildly reduced.  ------------------------------------------------------------ Pulmonic valve: Doppler: No significant regurgitation.  ------------------------------------------------------------ Tricuspid valve: Doppler: Mild regurgitation.  ------------------------------------------------------------ Pulmonary artery: Systolic pressure was moderately increased.  ------------------------------------------------------------ Right atrium: The atrium was severely dilated.  ------------------------------------------------------------ Pericardium: There was no pericardial effusion.  ------------------------------------------------------------ Systemic veins: Inferior vena cava: The vessel was dilated; the respirophasic diameter changes were blunted (< 50%); findings are consistent with elevated central venous pressure.  ------------------------------------------------------------  2D measurements Normal Doppler measurements Norma Left ventricle l LVID ED, 54.3 mm 43-52 Main pulmonary artery chord, Pressure, 55 mm Hg =30 PLAX S LVID ES, 31.8 mm 23-38 LVOT chord, Peak vel, 97 cm/s ----- PLAX S FS, chord, 41 % >29 VTI, S 27. cm ----- PLAX 7 LVPW, ED 12.7 mm ------ Aortic valve IVS/LVPW 0.99 <1.3 Peak vel, 545 cm/s ----- ratio, ED S Ventricular  septum Mean vel, 367 cm/s ----- IVS, ED 12.6 mm ------ S LVOT VTI, S 132 cm ----- Diam, S 17 mm ------ Mean 66 mm Hg ----- Area 2.27 cm^2 ------ gradient, Aorta S Root diam, 26 mm ------ Peak 119 mm Hg ----- ED gradient, Left atrium S AP dim 57 mm ------ VTI ratio 0.2 ----- AP dim 2.84 cm/m^2 <2.2 LVOT/AV 1 index Area index 0.2 cm^2/m^2 ----- (VTI) 4 Peak  vel 0.1 ----- ratio, 8 LVOT/AV Area index 0.2 cm^2/m^2 ----- (Vmax) Mitral valve Max regurg 731 cm/s ----- vel Regurg VTI 238 cm ----- Tricuspid valve Regurg 352 cm/s ----- peak vel Peak RV-RA 50 mm Hg ----- gradient, S Systemic veins Estimated 5 mm Hg ----- CVP Right ventricle Pressure, 55 mm Hg <30 S     Assessment / Plan:     Symptomatic critical aortic stenosis, in need of aortic valve replacement in the very near future. Calcified descending aorta, not enlarged at time of cath. CTA has  not been done we'll arrange preoperatively, to evaluate the size of aortic and also the extent of calcification Coronary arteries are patent Chronic atrial fibrillation greater than 10 years History of prostate cancer treated with radiation Diabetes mellitus  I spent more than 50 minutes face-to-face with the patient discussing the risks and options and need for aortic valve replacement with his more than critical aortic stenosis that is symptomatic. The risks of surgery including death infection stroke myocardial infarction and bleeding have all been discussed in detail. We discussed concomitant Maze procedure and at least clip on the atrial appendage. We'll review the CT scan prior to surgery if the patient does have a porcelain aorta we will consider alternatives.  The patient is willing to proceed with surgery which is been tentatively arranged for Monday, February 24. He is on Coumadin and this will be held starting February 18.   Delight Ovens MD  Beeper [phone] Office [phone] 12/21/2012 4:55 PM  Addendum: Dg Chest 2 View  12/24/2012  *RADIOLOGY REPORT*  Clinical Data: Preadmission respiratory films.  CHEST - 2 VIEW  Comparison: None.  Findings: There is marked cardiomegaly without pulmonary edema.  No pneumothorax or pleural effusion.  No focal bony abnormality.  IMPRESSION: Marked cardiomegaly without acute disease.   Original Report Authenticated By: Holley Dexter,  M.D.    Ct Angio Chest Aorta W/cm &/or Wo/cm  12/24/2012  *RADIOLOGY REPORT*  Clinical Data: Aortic stenosis.  Evaluate calcification level of the ascending aorta.  CT ANGIOGRAPHY CHEST  Technique:  Multidetector CT imaging of the chest using the standard protocol during bolus administration of intravenous contrast. Multiplanar reconstructed images including MIPs were obtained and reviewed to evaluate the vascular anatomy.  Contrast: OMNIPAQUE IOHEXOL 350 MG/ML SOLN  Comparison: None.  Findings: There is extensive calcification within the aortic valve. There is also circumferential calcification in the ascending aorta extending to the level of the proximal aortic arch.  Aortic wall calcification is less prominent in the aortic arch and descending aorta.  No evidence of aortic dissection.  Maximal diameter of the ascending aorta is 3.8 cm. Mild atherosclerotic plaque in the descending aorta.  Innominate artery, right subclavian artery, left common carotid artery, left subclavian artery are patent.  Right internal carotid artery origin is tortuous but patent.  Left vertebral artery is grossly patent.  Right vertebral artery is diminutive and poorly visualized.  No obvious filling defects are present in the pulmonary arterial tree to suggest acute pulmonary thromboembolism.  15 mm  short axis diameter precarinal lymph node.  Scattered smaller nodes are seen within the mediastinum.  Negative abnormal hilar adenopathy.  Calcifications of the right coronary, left anterior descending coronary, and circumflex coronary artery are present. Mild mitral annular calcification.  The heart is markedly enlarged.  No pneumothorax.  No pleural effusion.  Cystic changes at the left lung base have a chronic appearance.  No cavitary lung mass.  No consolidation.  Diffuse hepatic steatosis.  Upper abdomen is otherwise benign.  No destructive bone lesion.  Scattered degenerative changes in the thoracic spine are noted.   IMPRESSION: There is extensive aortic valve calcifications as well as circumferential calcification in the ascending aorta to the proximal arch.  Less prominent calcification is present in the arch and descending aorta with mild scattered plaque in the descending aorta.  No evidence of significant stenosis in the great vessels.  The right vertebral artery, however is poorly visualized.  Single mildly enlarged mediastinal lymph node.  Differential diagnosis includes volume overload, inflammatory disease, and less likely malignancy given lack of pulmonary nodules or other evidence of metastatic disease.  Consider 33-month follow-up to ensure resolution.  Three-vessel coronary artery calcifications.  Diffuse hepatic steatosis  Marked cardiomegaly.   Original Report Authenticated By: Jolaine Click, M.D.    After obtaining the CTA of the chest the concern for extensive calcification of the ascending aorta is confirmed. 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 With the patient needing aortic valve replacement because of critical aortic stenosis but with patent coronary vessels the consideration of TVAR approach to correcting his critical and symptomatic aortic stenosis. 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 With his age and porcelain aorta standard aortic valve replacement may have an operative mortality is high as 15% and risk of stroke or other significant morbidity as high as 50%. I discussed this with Dr. Excell Seltzer who has seen the patient and will evaluate for TVAR. I've called the patient and discussed this change in  recommendation to him. He will resume his Coumadin until final plans are made. Arrangements have been made for a cardiac gated CT scan and CTA to evaluate the abdominal and femoral vessels.    Delight Ovens MD  Beeper [phone] Office [phone] 12/25/2012 4:08 PM

## 2012-12-22 ENCOUNTER — Encounter (HOSPITAL_COMMUNITY): Payer: Self-pay | Admitting: Pharmacy Technician

## 2012-12-24 ENCOUNTER — Encounter (HOSPITAL_COMMUNITY): Payer: Self-pay

## 2012-12-24 ENCOUNTER — Ambulatory Visit (HOSPITAL_COMMUNITY)
Admission: RE | Admit: 2012-12-24 | Discharge: 2012-12-24 | Disposition: A | Discharge status: Home / Self Care | Payer: Medicare Other | Source: Ambulatory Visit | Attending: Cardiothoracic Surgery | Admitting: Cardiothoracic Surgery

## 2012-12-24 ENCOUNTER — Ambulatory Visit
Admission: RE | Admit: 2012-12-24 | Discharge: 2012-12-24 | Disposition: A | Discharge status: Home / Self Care | Payer: Medicare Other | Source: Ambulatory Visit | Attending: Cardiothoracic Surgery | Admitting: Cardiothoracic Surgery

## 2012-12-24 ENCOUNTER — Telehealth: Payer: Self-pay | Admitting: *Deleted

## 2012-12-24 ENCOUNTER — Inpatient Hospital Stay (HOSPITAL_COMMUNITY)
Admission: RE | Admit: 2012-12-24 | Discharge: 2012-12-24 | Disposition: A | Discharge status: Home / Self Care | Payer: Medicare Other | Source: Ambulatory Visit | Attending: Cardiothoracic Surgery | Admitting: Cardiothoracic Surgery

## 2012-12-24 ENCOUNTER — Encounter (HOSPITAL_COMMUNITY)
Admission: RE | Admit: 2012-12-24 | Discharge: 2012-12-24 | Disposition: A | Discharge status: Home / Self Care | Payer: Medicare Other | Source: Ambulatory Visit | Attending: Cardiothoracic Surgery | Admitting: Cardiothoracic Surgery

## 2012-12-24 ENCOUNTER — Encounter (HOSPITAL_COMMUNITY): Payer: Self-pay | Admitting: Pharmacy Technician

## 2012-12-24 VITALS — BP 134/68 | HR 57 | Temp 97.6°F | Resp 20 | Ht 68.0 in | Wt 182.1 lb

## 2012-12-24 DIAGNOSIS — I359 Nonrheumatic aortic valve disorder, unspecified: Secondary | ICD-10-CM

## 2012-12-24 DIAGNOSIS — I10 Essential (primary) hypertension: Secondary | ICD-10-CM | POA: Insufficient documentation

## 2012-12-24 DIAGNOSIS — Z01810 Encounter for preprocedural cardiovascular examination: Secondary | ICD-10-CM | POA: Insufficient documentation

## 2012-12-24 HISTORY — DX: Cardiac murmur, unspecified: R01.1

## 2012-12-24 HISTORY — DX: Anemia, unspecified: D64.9

## 2012-12-24 LAB — BLOOD GAS, ARTERIAL
Acid-Base Excess: 2 mmol/L (ref 0.0–2.0)
Bicarbonate: 25.7 mEq/L — ABNORMAL HIGH (ref 20.0–24.0)
Drawn by: 344381
FIO2: 0.21 %
O2 Saturation: 95.3 %
Patient temperature: 98.6
TCO2: 26.9 mmol/L (ref 0–100)
pCO2 arterial: 38.1 mmHg (ref 35.0–45.0)
pH, Arterial: 7.444 (ref 7.350–7.450)
pO2, Arterial: 74 mmHg — ABNORMAL LOW (ref 80.0–100.0)

## 2012-12-24 LAB — PULMONARY FUNCTION TEST

## 2012-12-24 LAB — COMPREHENSIVE METABOLIC PANEL
ALT: 40 U/L (ref 0–53)
AST: 41 U/L — ABNORMAL HIGH (ref 0–37)
Albumin: 4.3 g/dL (ref 3.5–5.2)
Alkaline Phosphatase: 66 U/L (ref 39–117)
BUN: 31 mg/dL — ABNORMAL HIGH (ref 6–23)
CO2: 30 mEq/L (ref 19–32)
Calcium: 9.8 mg/dL (ref 8.4–10.5)
Chloride: 99 mEq/L (ref 96–112)
Creatinine, Ser: 1.04 mg/dL (ref 0.50–1.35)
GFR calc Af Amer: 77 mL/min — ABNORMAL LOW (ref >90)
GFR calc non Af Amer: 67 mL/min — ABNORMAL LOW (ref >90)
Glucose, Bld: 129 mg/dL — ABNORMAL HIGH (ref 70–99)
Potassium: 4.4 mEq/L (ref 3.5–5.1)
Sodium: 136 mEq/L (ref 135–145)
Total Bilirubin: 1.2 mg/dL (ref 0.3–1.2)
Total Protein: 7.4 g/dL (ref 6.0–8.3)

## 2012-12-24 LAB — URINE MICROSCOPIC-ADD ON

## 2012-12-24 LAB — SURGICAL PCR SCREEN
MRSA, PCR: NEGATIVE
Staphylococcus aureus: NEGATIVE

## 2012-12-24 LAB — TYPE AND SCREEN
ABO/RH(D): O NEG
Antibody Screen: NEGATIVE

## 2012-12-24 LAB — URINALYSIS, ROUTINE W REFLEX MICROSCOPIC
Bilirubin Urine: NEGATIVE
Glucose, UA: NEGATIVE mg/dL
Ketones, ur: NEGATIVE mg/dL
Leukocytes, UA: NEGATIVE
Nitrite: NEGATIVE
Protein, ur: NEGATIVE mg/dL
Specific Gravity, Urine: 1.044 — ABNORMAL HIGH (ref 1.005–1.030)
Urobilinogen, UA: 1 mg/dL (ref 0.0–1.0)
pH: 6 (ref 5.0–8.0)

## 2012-12-24 LAB — CBC
HCT: 45 % (ref 39.0–52.0)
Hemoglobin: 15.6 g/dL (ref 13.0–17.0)
MCH: 33.3 pg (ref 26.0–34.0)
MCHC: 34.7 g/dL (ref 30.0–36.0)
MCV: 95.9 fL (ref 78.0–100.0)
Platelets: 138 10*3/uL — ABNORMAL LOW (ref 150–400)
RBC: 4.69 MIL/uL (ref 4.22–5.81)
RDW: 13.2 % (ref 11.5–15.5)
WBC: 6 10*3/uL (ref 4.0–10.5)

## 2012-12-24 LAB — PROTIME-INR
INR: 1.08 (ref 0.00–1.49)
Prothrombin Time: 13.9 seconds (ref 11.6–15.2)

## 2012-12-24 LAB — APTT: aPTT: 24 seconds (ref 24–37)

## 2012-12-24 LAB — ABO/RH: ABO/RH(D): O NEG

## 2012-12-24 MED ORDER — IOHEXOL 350 MG/ML SOLN
100.0000 mL | Freq: Once | INTRAVENOUS | Status: AC | PRN
  Administered 2012-12-24: 100 mL via INTRAVENOUS

## 2012-12-24 MED ORDER — ALBUTEROL SULFATE (5 MG/ML) 0.5% IN NEBU
2.5000 mg | INHALATION_SOLUTION | Freq: Once | RESPIRATORY_TRACT | Status: AC
  Administered 2012-12-24: 2.5 mg via RESPIRATORY_TRACT

## 2012-12-24 NOTE — Pre-Procedure Instructions (Signed)
RANA ADORNO  12/24/2012   Your procedure is scheduled on: Monday 12/28/12   Report to [facility] at 530 AM.  Call this number if you have problems the morning of surgery: [phone]   Remember:   Do not eat food or drink liquids after midnight.   Take these medicines the morning of surgery with A SIP OF WATER: DIGOXIN(LANOXIN), METOPROLOL(LOPRESSOR)   Do not wear jewelry, make-up or nail polish.  Do not wear lotions, powders, or perfumes. You may wear deodorant.  Do not shave 48 hours prior to surgery. Men may shave face and neck.  Do not bring valuables to the hospital.  Contacts, dentures or bridgework may not be worn into surgery.  Leave suitcase in the car. After surgery it may be brought to your room.  For patients admitted to the hospital, checkout time is 11:00 AM the day of  discharge.   Patients discharged the day of surgery will not be allowed to drive  home.  Name and phone number of your driver:  Special Instructions: Shower using CHG 2 nights before surgery and the night before surgery.  If you shower the day of surgery use CHG.  Use special wash - you have one bottle of CHG for all showers.  You should use approximately 1/3 of the bottle for each shower.   Please read over the following fact sheets that you were given: Pain Booklet, Coughing and Deep Breathing, Blood Transfusion Information, Open Heart Packet, MRSA Information and Surgical Site Infection Prevention

## 2012-12-24 NOTE — Progress Notes (Signed)
12/24/12 1326  OBSTRUCTIVE SLEEP APNEA  Have you ever been diagnosed with sleep apnea through a sleep study? No  Do you snore loudly (loud enough to be heard through closed doors)?  1  Do you often feel tired, fatigued, or sleepy during the daytime? 1  Has anyone observed you stop breathing during your sleep? 0  Do you have, or are you being treated for high blood pressure? 1  BMI more than 35 kg/m2? 0  Age over 77 years old? 1  Neck circumference greater than 40 cm/18 inches? 0 (15 IN )  Obstructive Sleep Apnea Score 4  Score 4 or greater  Results sent to PCP

## 2012-12-24 NOTE — Telephone Encounter (Signed)
Completed patients heart surgery education in short stay.  Book given.  Answered all questions.  Told to call me if any questions come up.

## 2012-12-25 LAB — HEMOGLOBIN A1C
Hgb A1c MFr Bld: 7 % — ABNORMAL HIGH (ref <5.7)
Mean Plasma Glucose: 154 mg/dL — ABNORMAL HIGH (ref <117)

## 2012-12-28 ENCOUNTER — Encounter (HOSPITAL_COMMUNITY): Admission: RE | Payer: Self-pay | Source: Ambulatory Visit

## 2012-12-28 ENCOUNTER — Inpatient Hospital Stay (HOSPITAL_COMMUNITY): Admission: RE | Admit: 2012-12-28 | Payer: Medicare Other | Source: Ambulatory Visit | Admitting: Cardiothoracic Surgery

## 2012-12-28 ENCOUNTER — Other Ambulatory Visit: Payer: Self-pay | Admitting: *Deleted

## 2012-12-28 DIAGNOSIS — I509 Heart failure, unspecified: Secondary | ICD-10-CM

## 2012-12-28 DIAGNOSIS — I359 Nonrheumatic aortic valve disorder, unspecified: Secondary | ICD-10-CM

## 2012-12-28 SURGERY — REPLACEMENT, AORTIC VALVE, OPEN
Anesthesia: General

## 2012-12-30 ENCOUNTER — Encounter (HOSPITAL_COMMUNITY): Payer: Self-pay

## 2012-12-30 ENCOUNTER — Ambulatory Visit (HOSPITAL_COMMUNITY)
Admission: RE | Admit: 2012-12-30 | Discharge: 2012-12-30 | Disposition: A | Discharge status: Home / Self Care | Payer: Medicare Other | Source: Ambulatory Visit | Attending: Cardiovascular Disease | Admitting: Cardiovascular Disease

## 2012-12-30 DIAGNOSIS — I2510 Atherosclerotic heart disease of native coronary artery without angina pectoris: Secondary | ICD-10-CM | POA: Insufficient documentation

## 2012-12-30 DIAGNOSIS — I359 Nonrheumatic aortic valve disorder, unspecified: Secondary | ICD-10-CM

## 2012-12-30 DIAGNOSIS — Q2529 Other atresia of aorta: Secondary | ICD-10-CM | POA: Insufficient documentation

## 2012-12-30 DIAGNOSIS — I509 Heart failure, unspecified: Secondary | ICD-10-CM

## 2012-12-30 DIAGNOSIS — Z954 Presence of other heart-valve replacement: Secondary | ICD-10-CM | POA: Insufficient documentation

## 2012-12-30 DIAGNOSIS — Q251 Coarctation of aorta: Secondary | ICD-10-CM | POA: Insufficient documentation

## 2012-12-30 DIAGNOSIS — R599 Enlarged lymph nodes, unspecified: Secondary | ICD-10-CM | POA: Insufficient documentation

## 2012-12-30 MED ORDER — IOHEXOL 350 MG/ML SOLN
80.0000 mL | Freq: Once | INTRAVENOUS | Status: AC | PRN
  Administered 2012-12-30: 80 mL via INTRAVENOUS

## 2013-01-01 ENCOUNTER — Institutional Professional Consult (permissible substitution) (INDEPENDENT_AMBULATORY_CARE_PROVIDER_SITE_OTHER): Payer: Medicare Other | Admitting: Thoracic Surgery (Cardiothoracic Vascular Surgery)

## 2013-01-01 ENCOUNTER — Encounter (HOSPITAL_COMMUNITY): Payer: Medicare Other | Admitting: Cardiovascular Disease

## 2013-01-01 ENCOUNTER — Encounter (HOSPITAL_COMMUNITY): Payer: Medicare Other | Admitting: Thoracic Surgery (Cardiothoracic Vascular Surgery)

## 2013-01-01 ENCOUNTER — Encounter: Payer: Self-pay | Admitting: Thoracic Surgery (Cardiothoracic Vascular Surgery)

## 2013-01-01 VITALS — BP 144/64 | HR 65 | Resp 18 | Ht 68.0 in | Wt 184.0 lb

## 2013-01-01 DIAGNOSIS — I359 Nonrheumatic aortic valve disorder, unspecified: Secondary | ICD-10-CM

## 2013-01-01 NOTE — Progress Notes (Signed)
6 Minute Walk Test Results  Patient: [name] Date:  01/01/2013   Supplemental O2 during test? NO      Baseline   End  Time   1000    1006 Heartrate  65    72 Dyspnea  MILD               MODERATE Fatigue  MILD    MODERATE O2 sat   97%    96% Blood pressure 147/63    142/65   Patient ambulated at a STEADY pace for a total distance of 1000 feet with 0 stops.  Ambulation was limited primarily due to mild shortness of breath.  Overall the test was tolerated well.  Pulmonary Function Tests  Baseline      Post-bronchodilator  FVC  2.73 L  (75% predicted) FVC  2.93 L  (80% predicted) FEV1  1.95 L  (75% predicted) FEV1  2.23 L  (86% predicted) FEF25-75 1.24 L  (68% predicted) FEF25-75 2.17 L  (120% predicted)  RV  2.66 L  (108% predicted) DLCO  78% predicted    Review of Systems:   General:  NO loss of appetite, YES decreased energy, NO weight gain, NO weight loss, NO fever  Cardiac:  NO chest pain with exertion, NO chest pain at rest, YES SOB with exertion, NO resting SOB, NO PND, NO orthopnea, NO palpitations, YES arrhythmia, YES atrial fibrillation, NO LE edema, NO dizzy spells, NO syncope  Respiratory:  NO shortness of breath, NO home oxygen, NO productive cough, NO dry cough, NO bronchitis, NO wheezing, NO hemoptysis, NO asthma, NO pain with inspiration or cough, NO sleep apnea, NO CPAP at night  GI:   NO difficulty swallowing, NO reflux, NO frequent heartburn, NO hiatal hernia, NO abdominal pain, YES constipation, NO diarrhea, NO hematochezia, NO hematemesis, NO melena  GU:   NO dysuria,  YES frequency, NO urinary tract infection, NO hematuria, NO enlarged prostate, NO kidney stones, NO kidney disease  Vascular:  NO pain suggestive of claudication, NO pain in feet, YES leg cramps, NO varicose veins, NO DVT, NO non-healing foot ulcer  Neuro:   NO stroke, NO TIA's, NO seizures, NO headaches, NOtemporary blindness one eye,  NO slurred speech, NO peripheral neuropathy,  NO chronic pain, NO instability of gait, NO memory/cognitive dysfunction  Musculoskeletal: YES arthritis, NO joint swelling, NO myalgias, NO difficulty walking, NO mobility   Skin:   NO rash, NO itching, NO skin infections, NO pressure sores or ulcerations  Psych:   NO anxiety, NO depression, NO nervousness, YES unusual recent stress  Eyes:   NO blurry vision, NO floaters, NO recent vision changes, NO wears glasses or contacts  ENT:   NO hearing loss, NO loose or painful teeth, NO dentures, last saw dentist 2013  Hematologic:  NO easy bruising, NO abnormal bleeding, NOclotting disorder, NO frequent epistaxis  Endocrine:  NO diabetes, does not check CBG's at home N/A

## 2013-01-01 NOTE — Patient Instructions (Signed)
Continue all current medications without change and call to report any increased symptoms of shortness of breath or the development of significant dizzy spells

## 2013-01-01 NOTE — Progress Notes (Addendum)
[address]            [name] [address]          [phone]     CARDIOTHORACIC SURGERY CONSULTATION REPORT  Referring Provider is Tonny Bollman, MD PCP is Pamelia Hoit, MD  Chief Complaint  Patient presents with  . Aortic Stenosis    severe AS, discuss TAVR    HPI:  Patient is a 79 year old white male with history of aortic stenosis, chronic persistent atrial fibrillation, type II diabetes, and remote history of tobacco use who Has been followed for years by Dr. Ladona Ridgel for management of atrial fibrillation. He has developed aortic stenosis which has progressed in severity on followup echocardiography and which has been associated with development of symptoms of exertional shortness of breath and fatigue.  Most recent echocardiogram demonstrates the presence of critical aortic stenosis with mean transvalvular gradient estimated 66 mmHg and peak velocity greater than 5 m/s.  Cardiac catheterization confirmed the presence of severe aortic stenosis with mean gradient measured 51 mmHg and was also notable for the absence of significant coronary artery disease.  There was moderate pulmonary hypertension and low normal left ventricular systolic function. The patient was referred to Dr. Tyrone Sage to consider conventional aortic valve replacement but the patient was felt to be extremely poor candidate for surgery do to the presence of severe calcification of the entire ascending thoracic aorta. 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  The patient has been referred for a second surgical opinion and to consider transcatheter aortic valve replacement as an alternative.  The patient describes symptoms of shortness of breath with moderate and strenuous physical exertion. He denies resting shortness of breath, PND, orthopnea, and lower extremity edema. He has long-standing atrial fibrillation and occasional  palpitations. He denies any history of chest pain, chest tightness, dizzy spells, or syncope.  Past Medical History  Diagnosis Date  . Aortic stenosis      moderate to severe with valve area 0.95  . HTN (hypertension)   . Bradycardia   . Arthritis     left wrist  . Diabetes mellitus 12/21/2012  . Hyperlipemia 12/21/2012  . H/O prostate cancer 12/21/2012  . Anticoagulated on Coumadin 12/21/2012  . Atrial fibrillation     [name], DR COOPER  . Atrial fibrillation   . Cancer   . Heart murmur   . Anemia     LOW PLATELETS OTHER DAY  PER PT    Past Surgical History  Procedure Laterality Date  . Transthoracic echocardiogram  09/04/10, 09/07/08  . Fracture surgery      left lower arm  . Cataract extraction w/ intraocular lens  implant, bilateral      Family History  Problem Relation Age of Onset  . Diabetes Mother   . Cancer Mother 78    bone,melanoma  . Cancer Father 56    pancreatic  . Heart disease Father   . Stroke Sister   . Cancer Brother 20    ? brain cancer    History   Social History  . Marital Status: Married    Spouse Name: N/A    Number of Children: N/A  . Years of Education: N/A   Occupational History  . Not on file.   Social History Main Topics  . Smoking status: Former Smoker -- 2.00 packs/day  Types: Cigarettes    Quit date: 09/11/1976  . Smokeless tobacco: Not on file  . Alcohol Use: No  . Drug Use: No  . Sexually Active: Not on file   Other Topics Concern  . Not on file   Social History Narrative  . No narrative on file    Current Outpatient Prescriptions  Medication Sig Dispense Refill  . B Complex Vitamins (B COMPLEX 50) TABS Take 1 each by mouth every morning.      . cholecalciferol (VITAMIN D) 1000 UNITS tablet Take 1,000 Units by mouth 2 (two) times daily.      . digoxin (LANOXIN) 0.25 MG tablet Take 250 mcg by mouth daily.       Marland Kitchen doxazosin (CARDURA) 8 MG tablet Take 8 mg by mouth at bedtime.       Marland Kitchen glipiZIDE (GLUCOTROL)  5 MG tablet Take 5 mg by mouth 2 (two) times daily before a meal.        . hydrochlorothiazide (MICROZIDE) 12.5 MG capsule Take 12.5 mg by mouth every evening.      Marland Kitchen lisinopril-hydrochlorothiazide (PRINZIDE,ZESTORETIC) 20-12.5 MG per tablet Take 2 tablets by mouth every morning.       . magnesium oxide (MAG-OX) 400 MG tablet Take 400 mg by mouth 2 (two) times daily.      . metFORMIN (GLUCOPHAGE) 1000 MG tablet Take 1,000 mg by mouth 2 (two) times daily with a meal.        . metoprolol tartrate (LOPRESSOR) 25 MG tablet Take 12.5 mg by mouth 2 (two) times daily.      . Potassium Gluconate 595 MG CAPS Take 595 mg by mouth 2 (two) times daily.      . Selenium 200 MCG CAPS Take 200 mcg by mouth every morning.       . simvastatin (ZOCOR) 40 MG tablet Take 40 mg by mouth at bedtime.        . vitamin A 8000 UNIT capsule Take 8,000 Units by mouth every morning.      . vitamin B-12 (CYANOCOBALAMIN) 1000 MCG tablet Take 1,000 mcg by mouth 2 (two) times daily.      . vitamin C (ASCORBIC ACID) 500 MG tablet Take 500 mg by mouth every morning.       . warfarin (COUMADIN) 7.5 MG tablet Take 7.5 mg by mouth daily.       Marland Kitchen zinc gluconate 50 MG tablet Take 50 mg by mouth every morning.        No current facility-administered medications for this visit.    No Known Allergies    Review of Systems:   General:  normal appetite, decreased energy, no weight gain, no weight loss, no fever  Cardiac:  no chest pain with exertion, no chest pain at rest, + SOB with moderate exertion, no resting SOB, no PND, no orthopnea, occasional palpitations, + arrhythmia, + atrial fibrillation, no LE edema, no dizzy spells, no syncope  Respiratory:  exertional shortness of breath, no home oxygen, no productive cough, no dry cough, no bronchitis, no wheezing, no hemoptysis, no asthma, no pain with inspiration or cough, no sleep apnea, no CPAP at night  GI:   no difficulty swallowing, no reflux, no frequent heartburn, no hiatal  hernia, no abdominal pain, + constipation, no diarrhea, no hematochezia, no hematemesis, no melena  GU:   no dysuria,  + frequency, no urinary tract infection, no hematuria, no enlarged prostate, no kidney stones, no kidney disease  Vascular:  no pain  suggestive of claudication, no pain in feet, no leg cramps, no varicose veins, no DVT, no non-healing foot ulcer  Neuro:   no stroke, no TIA's, no seizures, no headaches, no temporary blindness one eye,  no slurred speech, no peripheral neuropathy, no chronic pain, no instability of gait, no memory/cognitive dysfunction  Musculoskeletal: mild arthritis, no joint swelling, no myalgias, no difficulty walking, normal mobility   Skin:   no rash, no itching, no skin infections, no pressure sores or ulcerations  Psych:   no anxiety, no depression, no nervousness, no unusual recent stress  Eyes:   no blurry vision, no floaters, no recent vision changes  ENT:   no hearing loss, no loose or painful teeth  Hematologic:  no easy bruising, no abnormal bleeding, no clotting disorder, no frequent epistaxis  Endocrine:  + diabetes     Physical Exam:   BP 144/64  Pulse 65  Resp 18  Ht 5\' 8"  (1.727 m)  Wt 184 lb (83.462 kg)  BMI 27.98 kg/m2  SpO2 98%  General:    well-appearing  HEENT:  Unremarkable   Neck:   no JVD, no bruits, no adenopathy   Chest:   clear to auscultation, symmetrical breath sounds, no wheezes, no rhonchi   CV:   Irregular rate and rhythm, grade III/VI crescendo/decrescendo systolic murmur   Abdomen:  soft, non-tender, no masses   Extremities:  warm, well-perfused, pulses diminished, no LE edema  Rectal/GU  Deferred  Neuro:   Grossly non-focal and symmetrical throughout  Skin:   Clean and dry, no rashes, no breakdown   Diagnostic Tests:  Transthoracic Echocardiography  Patient: Joel, Cowin MR #: [id number] Study Date: 09/16/2012 Gender: M Age: 14 Height: 175.3cm Weight: 84.8kg BSA: 2.48m^2 Pt.  Status: Room:  Armond Hang, Sharlot Gowda REFERRING Lewayne Bunting ATTENDING Nahser, Philip SONOGRAPHER Luvenia Redden, RDCS PERFORMING [facility], [facility] cc:  ------------------------------------------------------------ LV EF: 55% - 60%  ------------------------------------------------------------ Indications: 424.1 Aortic valve disorders.  ------------------------------------------------------------ History: PMH: Acquired from the patient and from the patient's chart. Exertional dyspnea. Atrial fibrillation. Right bundle branch block. Aortic valve disease. Risk factors: Hypertension. Dyslipidemia.  ------------------------------------------------------------ Study Conclusions  - Left ventricle: The cavity size was mildly dilated. Wall thickness was increased in a pattern of mild LVH. Systolic function was normal. The estimated ejection fraction was in the range of 55% to 60%. - Aortic valve: There was severe stenosis. Mild regurgitation. - Left atrium: The atrium was moderately to severely dilated. - Right ventricle: The cavity size was mildly dilated. Systolic function was mildly reduced. - Right atrium: The atrium was severely dilated. - Pulmonary arteries: Systolic pressure was moderately increased. PA peak pressure: 55mm Hg (S). Transthoracic echocardiography. M-mode, complete 2D, spectral Doppler, and color Doppler. Height: Height: 175.3cm. Height: 69in. Weight: Weight: 84.8kg. Weight: 186.6lb. Body mass index: BMI: 27.6kg/m^2. Body surface area: BSA: 2.31m^2. Blood pressure: 130/70. Patient status: Outpatient. Location: [facility]  ------------------------------------------------------------  ------------------------------------------------------------ Left ventricle: The cavity size was mildly dilated. Wall thickness was increased in a pattern of mild LVH. Systolic function was normal. The estimated ejection fraction was in the range of 55% to  60%.  ------------------------------------------------------------ Aortic valve: The aortic valve gradient may be overestimated in the presence of aortic insufficiency. Moderately thickened, moderately calcified leaflets. Doppler: There was severe stenosis. Mild regurgitation. VTI ratio of LVOT to aortic valve: 0.21. Indexed valve area: 0.24cm^2/m^2 (VTI). Peak velocity ratio of LVOT to aortic valve: 0.18. Indexed valve area: 0.2cm^2/m^2 (Vmax). Mean gradient: 66mm Hg (  S). Peak gradient: Hg (S).  ------------------------------------------------------------ Aorta: Aortic root: The aortic root was normal in size. Ascending aorta: The ascending aorta was normal in size.  ------------------------------------------------------------ Mitral valve: Mildly thickened leaflets . Doppler: Trivial regurgitation.  ------------------------------------------------------------ Left atrium: The atrium was moderately to severely dilated.  ------------------------------------------------------------ Right ventricle: The cavity size was mildly dilated. Systolic function was mildly reduced.  ------------------------------------------------------------ Pulmonic valve: Doppler: No significant regurgitation.  ------------------------------------------------------------ Tricuspid valve: Doppler: Mild regurgitation.  ------------------------------------------------------------ Pulmonary artery: Systolic pressure was moderately increased.  ------------------------------------------------------------ Right atrium: The atrium was severely dilated.  ------------------------------------------------------------ Pericardium: There was no pericardial effusion.  ------------------------------------------------------------ Systemic veins: Inferior vena cava: The vessel was dilated; the respirophasic diameter changes were blunted (< 50%); findings are consistent with elevated central  venous pressure.  ------------------------------------------------------------  2D measurements Normal Doppler measurements Norma Left ventricle l LVID ED, 54.3 mm 43-52 Main pulmonary artery chord, Pressure, 55 mm Hg =30 PLAX S LVID ES, 31.8 mm 23-38 LVOT chord, Peak vel, 97 cm/s ----- PLAX S FS, chord, 41 % >29 VTI, S 27. cm ----- PLAX 7 LVPW, ED 12.7 mm ------ Aortic valve IVS/LVPW 0.99 <1.3 Peak vel, 545 cm/s ----- ratio, ED S Ventricular septum Mean vel, 367 cm/s ----- IVS, ED 12.6 mm ------ S LVOT VTI, S 132 cm ----- Diam, S 17 mm ------ Mean 66 mm Hg ----- Area 2.27 cm^2 ------ gradient, Aorta S Root diam, 26 mm ------ Peak 119 mm Hg ----- ED gradient, Left atrium S AP dim 57 mm ------ VTI ratio 0.2 ----- AP dim 2.84 cm/m^2 <2.2 LVOT/AV 1 index Area index 0.2 cm^2/m^2 ----- (VTI) 4 Peak vel 0.1 ----- ratio, 8 LVOT/AV Area index 0.2 cm^2/m^2 ----- (Vmax) Mitral valve Max regurg 731 cm/s ----- vel Regurg VTI 238 cm ----- Tricuspid valve Regurg 352 cm/s ----- peak vel Peak RV-RA 50 mm Hg ----- gradient, S Systemic veins Estimated 5 mm Hg ----- CVP Right ventricle Pressure, 55 mm Hg <30 S  ------------------------------------------------------------ Prepared and Electronically Authenticated by  Kristeen Miss 2013-11-14T11:[id number]  Cardiac Catheterization Procedure Note  Name: SCHYLER COUNSELL  MRN: [id number]  DOB: 01/23/1933  Procedure: Right Heart Cath, Left Heart Cath, Selective Coronary Angiography, LV angiography  Indication: Severe aortic stenosis, symptomatic  Procedural Details: The right groin was prepped, draped, and anesthetized with 1% lidocaine. Using the modified Seldinger technique a 4 French sheath was placed in the right femoral artery and a 7 French sheath was placed in the right femoral vein. A [name]-Ganz catheter was used for the right heart catheterization. Standard protocol was followed for recording of right heart pressures  and sampling of oxygen saturations. Fick cardiac output was calculated. Standard Judkins catheters were used for selective coronary angiography and left ventriculography. A straight wire directed with an AL-1 catheter was used to cross the aortic valve. A pigtail was inserted over an exchange-length J-wire. There were no immediate procedural complications. The patient was transferred to the post catheterization recovery area for further monitoring.  Procedural Findings:  Hemodynamics  RA 13  RV 64/8  PA 69/20 mean 41  PCWP 21  LV 216/22  AO 151/58 mean 89  Oxygen saturations:  PA 62  AO 95  Ao valve hemodynamics:  Mean gradient: 51 mmHg, Ao valve area 0.4 square cm  Cardiac Output (Fick) 3.7  Cardiac Index (Fick) 1.8  Coronary angiography:  Coronary dominance: right  Left mainstem: patent, calcified, no obstructive disease  Left anterior descending (LAD): Diffuse irregularity, mildly calcified, reaches the LV apex.30-40%  stenosis at the diagonal origin. Diags are widely patent. No significant obstructive disease throughout the LAD distribution.  Left circumflex (LCx): Patent throughout. Intermediate branch and OM1 have minor nonobstructive disease without significant stenosis.  Right coronary artery (RCA): Large, dominant vessel. PDA and PLA branches are patent without stenosis.The mid-vessel has 20-30% stenosis.  Aortic valve: severe calcification throughout the valve leaflets.  Left ventriculography: Left ventricular systolic function is normal, LVEF is low-normal with LVEF estimated at 55%, there is no significant mitral regurgitation  Ao root angiography: Mild AI, heavily calcified ascending aorta  Final Conclusions:  1. Nonobstructive CAD as outlined above  2. Low-normal LV systolic function  3. Critical aortic stenosis  4. Pulmonary hypertension, likely related to chronic left heart disease.  Recommendations: Surgical evaluation for aortic valve replacement. He has been very  reluctant to pursue surgical AVR, but surgical risk is not high enough to consider TAVR. 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 Will refer him to Dr Tyrone Sage who operated on the patient's wife in the past.  Tonny Bollman  12/16/2012, 12:03 PM   CT ANGIOGRAPHY CHEST  Technique: Multidetector CT imaging of the chest using the  standard protocol during bolus administration of intravenous  contrast. Multiplanar reconstructed images including MIPs were  obtained and reviewed to evaluate the vascular anatomy.  Contrast: OMNIPAQUE IOHEXOL 350 MG/ML SOLN  Comparison: None.  Findings: There is extensive calcification within the aortic valve.  There is also circumferential calcification in the ascending aorta  extending to the level of the proximal aortic arch. Aortic wall  calcification is less prominent in the aortic arch and descending  aorta. No evidence of aortic dissection. Maximal diameter of the  ascending aorta is 3.8 cm. Mild atherosclerotic plaque in the  descending aorta.  Innominate artery, right subclavian artery, left common carotid  artery, left subclavian artery are patent. Right internal carotid  artery origin is tortuous but patent. Left vertebral artery is  grossly patent. Right vertebral artery is diminutive and poorly  visualized.  No obvious filling defects are present in the pulmonary arterial  tree to suggest acute pulmonary thromboembolism.  15 mm short axis diameter precarinal lymph node. Scattered smaller  nodes are seen within the mediastinum. Negative abnormal hilar  adenopathy.  Calcifications of the right coronary, left anterior descending  coronary, and circumflex coronary artery are present. Mild mitral  annular calcification. The heart is markedly enlarged.  No pneumothorax. No pleural effusion.  Cystic changes at the left lung base have a chronic appearance. No  cavitary lung mass. No  consolidation.  Diffuse hepatic steatosis. Upper abdomen is otherwise benign.  No destructive bone lesion. Scattered degenerative changes in the  thoracic spine are noted.  IMPRESSION:  There is extensive aortic valve calcifications as well as  circumferential calcification in the ascending aorta to the  proximal arch. Less prominent calcification is present in the arch  and descending aorta with mild scattered plaque in the descending  aorta.  No evidence of significant stenosis in the great vessels. The  right vertebral artery, however is poorly visualized.  Single mildly enlarged mediastinal lymph node. Differential  diagnosis includes volume overload, inflammatory disease, and less  likely malignancy given lack of pulmonary nodules or other evidence  of metastatic disease. Consider 76-month follow-up to ensure  resolution.  Three-vessel coronary artery calcifications.  Diffuse hepatic steatosis  Marked cardiomegaly.  Original Report Authenticated By: Jolaine Click, M.D.  CT ANGIOGRAPHY OF THE HEART, CORONARY ARTERY, STRUCTURE, AND  MORPHOLOGY  COMPARISON: No priors.  CONTRAST: 80mL OMNIPAQUE IOHEXOL 350 MG/ML SOLN  TECHNIQUE: CT angiography of the coronary vessels was performed on  a 256 channel system using prospective ECG gating. A scout and ECG-  gated noncontrast exam (for calcium scoring) were performed.  Appropriate delay was determined by bolus tracking after injection  of iodinated contrast, and an ECG-gated coronary CTA was performed  with sub-mm slice collimation throughout the cardiac cycle.  Imaging post processing was performed on an independent workstation  creating multiplanar and 3-D images, allowing for quantitative  analysis of the heart and coronary arteries. Note that this exam  targets the heart and the chest was not imaged in its entirety.  PREMEDICATION:  None.  FINDINGS:  Technical quality: Good.  Heart rate: 55-68 bpm (patient was in atrial  fibrillation).  CORONARY ARTERIES: Assessment not performed secondary to the  excessive imaging noise, small amount of motion related artifact  (related to irregular rhythm) and high degree of coronary artery  calcification. The patient has had a recent catheter angiogram as  well.  Aortic Root Measurements Pertinent to Potential TAVR Procedure:  Aortic Valve Description: The aortic valve was tricuspid with  severe thickening and calcification of all of the cusps. During  systole, there was limited valve opening with an estimated aortic  valve area of approximately 0.84 cm2 by planimetry.  Aortic Valve Area: 0.84 cm-sq  Annulus:  Planimetry - 5.6 cm2  Long & Short Axis - 31.0 x 25.2 mm  Circumference - 87.3 mm  Sinuses of Valsalva:  R SOV - width 34.1 mm  height 26.8 mm  L SOV - width 33.5 mm  height 20.3 mm  [place] SOV - width 33.6 mm  height 21.0 mm  Coronary Artery [place]:  L main - 15.2 mm from the annulus  RCA - 19.8 mm from the annulus  Ascending Aorta:  33.1 x 32.0 mm in diameter 4 cm above the annulus.  Potential Angiographic Angles:  LAO 11 degrees, Caudal 7 degrees  RAO 10 degrees, Caudal 28 degrees  OTHER AORTA AND PULMONARY MEASUREMENTS:  Descending aorta: (< 40 mm): 31 mm  Main pulmonary artery: (< 30 mm): 28 mm  OTHER FINDINGS:  Within the visualized portions of the thorax there are no  suspicious appearing pulmonary nodules, areas of airspace  consolidation or pleural effusions. Mild diffuse bronchial wall  thickening and some mild paraseptal emphysematous changes are  noted. There is no significant pericardial fluid, thickening or  pericardial calcification. Numerous borderline enlarged and mildly  enlarged mediastinal lymph nodes are noted, largest of which  measures up to 1.3 cm in short axis in the subcarinal station.  There are no aggressive appearing lytic or blastic lesions noted in  the visualized portions of the skeleton.  IMPRESSION:  1. Severely  sclerotic aortic valve with an estimated aortic valve  area of 0.84 cm2 by planimetry. Findings and measurements  pertinent to potential TAVR procedure, as detailed above.  2. Numerous borderline enlarged and mildly enlarged mediastinal  lymph nodes are nonspecific. Similar findings were seen in the  abdomen and pelvis (see that report for discussion and details).  Clinical correlation is recommended. Another differential  consideration would include an underlying lymphoproliferative  disorder.  3. Mild diffuse bronchial wall thickening with mild paraseptal  emphysematous changes.  Original Report Authenticated By: Trudie Reed, M.D.    CT ANGIOGRAPHY ABDOMEN AND PELVIS  Technique: Multidetector CT imaging  of the abdomen and pelvis was  performed using the standard protocol during bolus administration  of intravenous contrast. Multiplanar reconstructed images  including MIPs were obtained and reviewed to evaluate the vascular  anatomy.  Contrast: 80mL OMNIPAQUE IOHEXOL 350 MG/ML SOLN  Comparison: No priors.  Findings:  VASCULAR MEASUREMENTS PERTINENT TO TAVR:  AORTA:  Minimal Aortic Diameter - 13.1 x 12.9 mm (shortly above the aortic  bifurcation)  Severity of Aortic Calcification - Severe  RIGHT PELVIS:  Right Common Iliac Artery -  Minimal Diameter - 7.8 x 7.6 mm  Tortuosity - Mild  Calcification - Severe  Right External Iliac Artery -  Minimal Diameter - 7.3 x 7.5 mm  Tortuosity - Moderate  Calcification - Moderate to severe  Right Common Femoral Artery -  Minimal Diameter - 9.0 x 4.5 mm  Tortuosity - Mild  Calcification - Moderate  LEFT PELVIS:  Left Common Iliac Artery -  Minimal Diameter - 8.4 x 6.4 mm  Tortuosity - Mild  Calcification - Severe  Left External Iliac Artery -  Minimal Diameter - 9.5 x 5.3 mm  Tortuosity - Moderate  Calcification - Moderate to severe  Left Common Femoral Artery -  Minimal Diameter - 9.2 x 5.8 mm  Tortuosity - Mild   Calcification - Mild to moderate  OTHER FINDINGS  Lung Bases: Changes of paraseptal emphysema and diffuse bronchial  wall thickening in the lung bases bilaterally.  Abdomen/Pelvis: The appearance of the liver, gallbladder, spleen,  bilateral adrenal glands and the left kidney is unremarkable.  Multiple small foci of intraparenchymal fat in the pancreatic head  (a benign finding). Remainder the pancreas is otherwise normal in  appearance. In the upper pole of the right kidney there is an 8 mm  low attenuation lesion which is incompletely characterized on  today's examination, but statistically favored to represent a small  cyst. There is extensive atherosclerosis throughout the abdominal  aorta and the visceral vasculature, most notably at the origin of  the celiac axis where there is greater than 50% diameter stenosis.  No aneurysm or dissection.  Normal appendix. No significant volume of ascites. No  pneumoperitoneum. No pathologic distension of small bowel. There  are a few scattered colonic diverticula, without surrounding  inflammatory changes to suggest an acute colonic diverticulitis at  this time. Fiducial markers are noted in the region of the  prostate gland suggesting prior radiation therapy. There are  multiple borderline enlarged and mildly enlarged retroperitoneal  lymph nodes in the periaortic stations, measuring up to 11 mm in  short axis.  Musculoskeletal: There are no aggressive appearing lytic or blastic  lesions noted in the visualized portions of the skeleton.  Multilevel degenerative disc disease and facet arthropathy is noted  throughout the lumbar spine. At L4-L5 there is 4 mm of  anterolisthesis of L4 upon L5. L1-L2 there is a 4 mm  retrolisthesis of L1 upon L2.  Review of the MIP images confirms the above findings.  IMPRESSION:  1. Vascular findings pertinent to potential TAVR procedure, as  above. Notably, the patient does not appear to be a suitable   candidate for pelvic vascular access for placement of a Sapien  valve, but may be a potential candidate for placement of a CORE  valve.  2. Possible hemodynamically significant stenosis at the ostium of  the celiac axis.  3. Multiple borderline enlarged and minimally enlarged  retroperitoneal lymph nodes are nonspecific. However, given the  fiducial markers in the region of the  prostate gland, clinical  correlation is recommended with consideration for assessment of PSA  levels and potential further evaluation with PET CT if there is  clinical concern for metastatic disease.  4. Normal appendix.  5. Additional incidental findings, as above.  Original Report Authenticated By: Trudie Reed, M.D.   6 Minute Walk Test Results  Patient: STAN CANTAVE  Date: 01/01/2013  Supplemental O2 during test? NO  Baseline End  Time 1000 1006  Heartrate 65 72  Dyspnea MILD MODERATE  Fatigue MILD MODERATE  O2 sat 97% 96%  Blood pressure 147/63 142/65  Patient ambulated at a STEADY pace for a total distance of 1000 feet with 0 stops.  Ambulation was limited primarily due to mild shortness of breath.  Overall the test was tolerated well.    Pulmonary Function Tests  Baseline Post-bronchodilator  FVC 2.73 L (75% predicted) FVC 2.93 L (80% predicted)  FEV1 1.95 L (75% predicted) FEV1 2.23 L (86% predicted)  FEF25-75 1.24 L (68% predicted) FEF25-75 2.17 L (120% predicted)  RV 2.66 L (108% predicted)  DLCO 78% predicted     STS Risk Calculator  Procedure    AVR  Risk of Mortality   3.1% Morbidity or Mortality  18.2% Prolonged LOS   7.3% Short LOS    30% Permanent Stroke   1.7% Prolonged Vent Support  9.0% DSW Infection    0.24% Renal Failure    4.6% Reoperation    8.8%   Impression:  I spent greater than 50 minutes face to face time with the patient and spent considerable additional time reviewing the patient's multiple diagnostic tests and past medical history.  The patient  has severe symptomatic aortic stenosis with mild left ventricular dysfunction. 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 Based upon review of the patient's recent cardiac gated CT angiogram of the heart the patient appears to be a reasonably good candidate for transcatheter aortic valve replacement as an alternative. CT angiogram of the abdomen and pelvis demonstrated the presence of extensive dense calcification and diffuse narrowing throughout these vessels, precluding use of the transfemoral approach with currently available devices. I feel that the trans-apical approach for TAVR appears to be the best treatment alternative.   Plan:  The patient was counseled at length regarding treatment alternatives for management of severe symptomatic aortic stenosis. The high-risk nature of surgical intervention has been discussed in detail. Long-term prognosis with medical therapy was discussed. Alternative approaches such as conventional aortic valve replacement, transcatheter aortic valve replacement, and palliative medical therapy were compared and contrasted at length. This discussion was placed in the context of the patient's own specific clinical presentation and past medical history.  The patient is very interested in proceeding with transcatheter aortic valve replacement.  CT sizing of the patient's aortic annulus suggest that he may be in the gray zone between a 26 and 29 mm device, and initially we discussed the possibility of following the patient carefully over the short-term until a 29 mm device will be commercially available.  However, because of the extensive calcification involving the aortic valve, the aortic annulus, and the proximal aorta with significant narrowing and circumferential calcification at the sinotubular  junction, it may be that it would be wise to lean towards the smaller size.  We plan to review the patient's diagnostic tests were extensively and have the patient  return to the multidisciplinary heart valve clinic for further consultation in conjunction with Dr. Excell Seltzer next week. All the patient's questions have been addressed.      Salvatore Decent. Cornelius Moras, MD 01/01/2013 7:30 PM

## 2013-01-08 ENCOUNTER — Other Ambulatory Visit (HOSPITAL_COMMUNITY): Payer: Self-pay | Admitting: *Deleted

## 2013-01-08 ENCOUNTER — Encounter (HOSPITAL_COMMUNITY): Payer: Self-pay | Admitting: Cardiovascular Disease

## 2013-01-08 ENCOUNTER — Ambulatory Visit (HOSPITAL_COMMUNITY)
Admission: RE | Admit: 2013-01-08 | Discharge: 2013-01-08 | Disposition: A | Discharge status: Home / Self Care | Payer: Medicare Other | Source: Ambulatory Visit | Attending: Cardiovascular Disease | Admitting: Cardiovascular Disease

## 2013-01-08 ENCOUNTER — Encounter (HOSPITAL_COMMUNITY): Payer: Self-pay | Admitting: Thoracic Surgery (Cardiothoracic Vascular Surgery)

## 2013-01-08 ENCOUNTER — Ambulatory Visit (HOSPITAL_BASED_OUTPATIENT_CLINIC_OR_DEPARTMENT_OTHER)
Admission: RE | Admit: 2013-01-08 | Discharge: 2013-01-08 | Disposition: A | Discharge status: Home / Self Care | Payer: Medicare Other | Source: Ambulatory Visit | Attending: Thoracic Surgery (Cardiothoracic Vascular Surgery) | Admitting: Thoracic Surgery (Cardiothoracic Vascular Surgery)

## 2013-01-08 VITALS — BP 149/70 | HR 81 | Resp 20 | Ht 68.0 in | Wt 178.0 lb

## 2013-01-08 DIAGNOSIS — I724 Aneurysm of artery of lower extremity: Secondary | ICD-10-CM

## 2013-01-08 DIAGNOSIS — I359 Nonrheumatic aortic valve disorder, unspecified: Secondary | ICD-10-CM

## 2013-01-08 NOTE — Patient Instructions (Signed)
Stop coumadin (warfarin) after taking it on Thursday March 13th.   Stop metformin after taking it on Sunday March 16th.  Continue all other medications through the night before surgery.  On the morning of surgery take only metoprolol with a sip of water

## 2013-01-08 NOTE — Progress Notes (Signed)
The right groin ultrasound shows no evidence of pseudoaneurysm or A-V fistula.

## 2013-01-08 NOTE — Progress Notes (Signed)
[facility]   MULTIDISCIPLINARY HEART VALVE CLINIC       CARDIOTHORACIC SURGERY NOTE  Referring Provider is Tonny Bollman, MD PCP is Pamelia Hoit, MD   HPI:  The patient returns for followup of severe symptomatic aortic stenosis.  Over the past week he reports no new problems or complaints. He has stable symptoms of exertional shortness of breath. He does complain of a lump in his right groin at the site of this recent catheterization. He also complains of a small painful area immediately over his tailbone (coccyx) but he denies any fevers chills or other complaints. He returns to the office today to discuss treatment options further.  The remainder of his review of systems is unchanged from previously.    Current Outpatient Prescriptions  Medication Sig Dispense Refill  . B Complex Vitamins (B COMPLEX 50) TABS Take 1 each by mouth every morning.      . cholecalciferol (VITAMIN D) 1000 UNITS tablet Take 1,000 Units by mouth 2 (two) times daily.      . digoxin (LANOXIN) 0.25 MG tablet Take 250 mcg by mouth daily.       Marland Kitchen doxazosin (CARDURA) 8 MG tablet Take 8 mg by mouth at bedtime.       Marland Kitchen glipiZIDE (GLUCOTROL) 5 MG tablet Take 5 mg by mouth 2 (two) times daily before a meal.        . hydrochlorothiazide (MICROZIDE) 12.5 MG capsule Take 12.5 mg by mouth every evening.      Marland Kitchen lisinopril-hydrochlorothiazide (PRINZIDE,ZESTORETIC) 20-12.5 MG per tablet Take 2 tablets by mouth every morning.       . magnesium oxide (MAG-OX) 400 MG tablet Take 400 mg by mouth 2 (two) times daily.      . metFORMIN (GLUCOPHAGE) 1000 MG tablet Take 1,000 mg by mouth 2 (two) times daily with a meal.        . metoprolol tartrate (LOPRESSOR) 25 MG tablet Take 12.5 mg by mouth 2 (two) times daily.      . Potassium Gluconate 595 MG CAPS Take 595 mg by mouth 2 (two) times daily.      . Selenium 200 MCG CAPS Take 200 mcg by mouth every morning.       . simvastatin (ZOCOR) 40 MG tablet Take 40 mg  by mouth at bedtime.        . vitamin A 8000 UNIT capsule Take 8,000 Units by mouth every morning.      . vitamin B-12 (CYANOCOBALAMIN) 1000 MCG tablet Take 1,000 mcg by mouth 2 (two) times daily.      . vitamin C (ASCORBIC ACID) 500 MG tablet Take 500 mg by mouth every morning.       . warfarin (COUMADIN) 7.5 MG tablet Take 7.5 mg by mouth daily.       Marland Kitchen zinc gluconate 50 MG tablet Take 50 mg by mouth every morning.        No current facility-administered medications for this encounter.      Physical Exam:   BP 149/70  Pulse 81  Resp 20  Ht 5\' 8"  (1.727 m)  Wt 178 lb (80.74 kg)  BMI 27.07 kg/m2  SpO2 96%  General:  Well-appearing  Chest:   Clear to auscultation  CV:   Regular rate and rhythm with prominent systolic murmur  Incisions:  n/a  Abdomen:  Soft and nontender. The patient has a small firm mass in the right groin at the site of catheterization which is nonpulsatile.  There is no surrounding hematoma. On examination of the patient's back there is no cellulitis nor other signs of inflammation or infection overlying the coccyx. The skin is intact without any breakdown  Extremities:  Warm and well-perfused without lower extremity edema  Diagnostic Tests:   Impression:  During the course of his work up he has been evaluated comprehensively by a multidisciplinary team of specialists coordinated through the Multidisciplinary Heart Valve Clinic in the [facility].  They have been demonstrated to suffer from symptomatic severe aortic stenosis as noted above. The patient has been counseled extensively as to the relative risks and benefits of all options for the treatment of severe aortic stenosis including long term medical therapy, conventional surgery for aortic valve replacement, and transcatheter aortic valve replacement.  The patient has been independently evaluated by two cardiac surgeons including Dr. Tyrone Sage and myself, and both of [place] feel that the  patient would be a poor candidate for conventional surgery (predicted risk of mortality >15% and/or predicted risk of permanent morbidity >50%) because of comorbidities including most notably his severely calcified, porcelain aorta.    Based upon review of all of the patient's preoperative diagnostic tests he is felt to be candidate for transcatheter aortic valve replacement using the transapical approach as an alternative to high risk conventional surgery.    Plan:  I spent in excess of 30 minutes discussing treatment alternatives with the patient in the valve clinic today in conjunction with Dr. Excell Seltzer. We again reviewed the indications, risks, and potential benefits of transcatheter aortic valve replacement using the transapical approach as an alternative to conventional surgery.    Following the decision to proceed with transcatheter aortic valve replacement, a discussion has been held regarding what types of management strategies would be attempted intraoperatively in the event of life-threatening complications, including whether or not the patient would be considered a candidate for the use of cardiopulmonary bypass and/or conversion to open sternotomy for attempted surgical intervention.  The patient has been advised of a variety of complications that might develop including but not limited to risks of death, stroke, paravalvular leak, aortic dissection or other major vascular complications, aortic annulus rupture, device embolization, cardiac rupture or perforation, mitral regurgitation, acute myocardial infarction, arrhythmia, heart block or bradycardia requiring permanent pacemaker placement, congestive heart failure, respiratory failure, renal failure, pneumonia, infection, other late complications related to structural valve deterioration or migration, or other complications that might ultimately cause a temporary or permanent loss of functional independence or other long term morbidity.  The  patient provides full informed consent for the procedure as planned and all questions were answered.  We plan to proceed with surgery on Tuesday, March 18. The patient has been instructed to stop taking Coumadin on Thursday, March 13 in anticipation of surgery. He'll stop taking metformin on Sunday, March 16.    Salvatore Decent. Cornelius Moras, MD 01/08/2013 2:48 PM

## 2013-01-08 NOTE — Progress Notes (Addendum)
MULTIDISCIPLINARY HEART VALVE CLINIC NOTE  Patient ID: [name] MRN: [id number] DOB/AGE: 1933-11-02 79 y.o.  Primary Care Physician:WILSON,FRED Sherilyn Cooter, MD Primary Cardiologist: Lewayne Bunting, MD  HPI: 79 year-old male returns to the multidisciplinary heart valve clinic for further discussion of his severe symptomatic aortic stenosis. He has undergone gated cardiac CTA, abdomen/pelvis CTA, and evaluation by Drs Tyrone Sage and Cornelius Moras since I have last seen him.  He continues to have dyspnea with moderate exertion, but no chest pain or pressure, orthopnea, PND, or leg swelling. He complains of tenderness and a 'knot' at his right groin cath site.  Past Medical History  Diagnosis Date  . Aortic stenosis      moderate to severe with valve area 0.95  . HTN (hypertension)   . Bradycardia   . Arthritis     left wrist  . Diabetes mellitus 12/21/2012  . Hyperlipemia 12/21/2012  . H/O prostate cancer 12/21/2012  . Anticoagulated on Coumadin 12/21/2012  . Atrial fibrillation     [name], DR Yuleidy Rappleye  . Atrial fibrillation   . Cancer   . Heart murmur   . Anemia     LOW PLATELETS OTHER DAY  PER PT    Past Surgical History  Procedure Laterality Date  . Transthoracic echocardiogram  09/04/10, 09/07/08  . Fracture surgery      left lower arm  . Cataract extraction w/ intraocular lens  implant, bilateral      Family History  Problem Relation Age of Onset  . Diabetes Mother   . Cancer Mother 30    bone,melanoma  . Cancer Father 31    pancreatic  . Heart disease Father   . Stroke Sister   . Cancer Brother 20    ? brain cancer    History   Social History  . Marital Status: Married    Spouse Name: N/A    Number of Children: N/A  . Years of Education: N/A   Occupational History  . Not on file.   Social History Main Topics  . Smoking status: Former Smoker -- 2.00 packs/day    Types: Cigarettes    Quit date: 09/11/1976  . Smokeless tobacco: Not on file  . Alcohol Use: No  .  Drug Use: No  . Sexually Active: Not on file   Other Topics Concern  . Not on file   Social History Narrative  . No narrative on file      (Not in a hospital admission)  No Known Allergies  BP 149/70  Pulse 81  Resp 20  Ht 5\' 8"  (1.727 m)  Wt 178 lb (80.74 kg)  BMI 27.07 kg/m2  SpO2 96%  PHYSICAL EXAM: Pt is alert and oriented, WD, WN, in no distress. HEENT: normal Neck: JVP normal. Carotid upstrokes delayed No thyromegaly. Lungs: equal expansion, clear bilaterally CV: Apex is discrete and nondisplaced, RRR with a grade 3/6 systolic murmur at the LSB Abd: soft, NT, +BS, no bruit, no hepatosplenomegaly Back: no CVA tenderness Ext: no C/C/E        Right groin with a small tender mass, systolic bruit present. No hematoma Skin: warm and dry without rash Neuro: CNII-XII intact             Strength intact = bilaterally   GATED CARDIAC CTA: Aortic Root Measurements Pertinent to Potential TAVR Procedure:  Aortic Valve Description: The aortic valve was tricuspid with  severe thickening and calcification of all of the cusps. During  systole, there was limited valve  opening with an estimated aortic  valve area of approximately 0.84 cm2 by planimetry.  Aortic Valve Area: 0.84 cm-sq  Annulus:  Planimetry - 5.6 cm2  Long & Short Axis - 31.0 x 25.2 mm  Circumference - 87.3 mm  Sinuses of Valsalva:  R SOV - width 34.1 mm  height 26.8 mm  L SOV - width 33.5 mm  height 20.3 mm  [place] SOV - width 33.6 mm  height 21.0 mm  Coronary Artery [place]:  L main - 15.2 mm from the annulus  RCA - 19.8 mm from the annulus  Ascending Aorta:  33.1 x 32.0 mm in diameter 4 cm above the annulus.  Potential Angiographic Angles:  LAO 11 degrees, Caudal 7 degrees  RAO 10 degrees, Caudal 28 degrees  OTHER AORTA AND PULMONARY MEASUREMENTS:  Descending aorta: (< 40 mm): 31 mm  Main pulmonary artery: (< 30 mm): 28 mm  OTHER FINDINGS:  Within the visualized portions of the thorax there are no   suspicious appearing pulmonary nodules, areas of airspace  consolidation or pleural effusions. Mild diffuse bronchial wall  thickening and some mild paraseptal emphysematous changes are  noted. There is no significant pericardial fluid, thickening or  pericardial calcification. Numerous borderline enlarged and mildly  enlarged mediastinal lymph nodes are noted, largest of which  measures up to 1.3 cm in short axis in the subcarinal station.  There are no aggressive appearing lytic or blastic lesions noted in  the visualized portions of the skeleton.  IMPRESSION:  1. Severely sclerotic aortic valve with an estimated aortic valve  area of 0.84 cm2 by planimetry. Findings and measurements  pertinent to potential TAVR procedure, as detailed above.  2. Numerous borderline enlarged and mildly enlarged mediastinal  lymph nodes are nonspecific. Similar findings were seen in the  abdomen and pelvis (see that report for discussion and details).  Clinical correlation is recommended. Another differential  consideration would include an underlying lymphoproliferative  disorder.  3. Mild diffuse bronchial wall thickening with mild paraseptal  emphysematous changes.   ECHO: Left ventricle: The cavity size was mildly dilated. Wall thickness was increased in a pattern of mild LVH. Systolic function was normal. The estimated ejection fraction was in the range of 55% to 60%.  ------------------------------------------------------------ Aortic valve: The aortic valve gradient may be overestimated in the presence of aortic insufficiency. Moderately thickened, moderately calcified leaflets. Doppler: There was severe stenosis. Mild regurgitation. VTI ratio of LVOT to aortic valve: 0.21. Indexed valve area: 0.24cm^2/m^2 (VTI). Peak velocity ratio of LVOT to aortic valve: 0.18. Indexed valve area: 0.2cm^2/m^2 (Vmax). Mean gradient: 66mm Hg (S). Peak gradient: Hg  (S).  ------------------------------------------------------------ Aorta: Aortic root: The aortic root was normal in size. Ascending aorta: The ascending aorta was normal in size.  ------------------------------------------------------------ Mitral valve: Mildly thickened leaflets . Doppler: Trivial regurgitation.  ------------------------------------------------------------ Left atrium: The atrium was moderately to severely dilated.  ------------------------------------------------------------ Right ventricle: The cavity size was mildly dilated. Systolic function was mildly reduced.  ------------------------------------------------------------ Pulmonic valve: Doppler: No significant regurgitation.  ------------------------------------------------------------ Tricuspid valve: Doppler: Mild regurgitation.  ------------------------------------------------------------ Pulmonary artery: Systolic pressure was moderately increased.  ------------------------------------------------------------ Right atrium: The atrium was severely dilated.  ------------------------------------------------------------ Pericardium: There was no pericardial effusion.  ------------------------------------------------------------ Systemic veins: Inferior vena cava: The vessel was dilated; the respirophasic diameter changes were blunted (< 50%); findings are consistent with elevated central venous pressure.  ------------------------------------------------------------  2D measurements Normal Doppler measurements Norma Left ventricle l LVID ED, 54.3 mm 43-52 Main pulmonary artery chord,  Pressure, 55 mm Hg =30 PLAX S LVID ES, 31.8 mm 23-38 LVOT chord, Peak vel, 97 cm/s ----- PLAX S FS, chord, 41 % >29 VTI, S 27. cm ----- PLAX 7 LVPW, ED 12.7 mm ------ Aortic valve IVS/LVPW 0.99 <1.3 Peak vel, 545 cm/s ----- ratio, ED S Ventricular septum Mean vel, 367 cm/s ----- IVS, ED 12.6 mm ------ S LVOT  VTI, S 132 cm ----- Diam, S 17 mm ------ Mean 66 mm Hg ----- Area 2.27 cm^2 ------ gradient, Aorta S Root diam, 26 mm ------ Peak 119 mm Hg ----- ED gradient, Left atrium S AP dim 57 mm ------ VTI ratio 0.2 ----- AP dim 2.84 cm/m^2 <2.2 LVOT/AV 1 index Area index 0.2 cm^2/m^2 ----- (VTI) 4 Peak vel 0.1 ----- ratio, 8 LVOT/AV Area index 0.2 cm^2/m^2 ----- (Vmax) Mitral valve Max regurg 731 cm/s ----- vel Regurg VTI 238 cm ----- Tricuspid valve Regurg 352 cm/s ----- peak vel Peak RV-RA 50 mm Hg ----- gradient, S Systemic veins Estimated 5 mm Hg ----- CVP Right ventricle Pressure, 55 mm Hg <30 S  ASSESSMENT AND PLAN:  79 year-old gentleman with severe symptomatic aortic stenosis. 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 His cardiac CTA and peripheral CTA studies have been carefully reviewed in conjunction with Dr Cornelius Moras. He does not have suitable access for transfemoral access. The risks, indications, and alternatives to transcatheter AVR from a transapical approach have been reviewed extensively with him. We plan on proceeding with surgery on March 18th. He will undergo a limited right groin ultrasound today to rule out pseudoaneurysm in light of his exam findings. He will stop warfarin 5 days prior to surgery and will stop Metformin 48 hours prior. All of his questions were answered today.  Tonny Bollman 01/08/2013 4:02 PM

## 2013-01-11 ENCOUNTER — Encounter (HOSPITAL_COMMUNITY): Payer: Self-pay

## 2013-01-13 ENCOUNTER — Other Ambulatory Visit: Payer: Self-pay | Admitting: *Deleted

## 2013-01-13 DIAGNOSIS — I359 Nonrheumatic aortic valve disorder, unspecified: Secondary | ICD-10-CM

## 2013-01-14 NOTE — H&P (Addendum)
[facility]   MULTIDISCIPLINARY HEART VALVE CLINIC   CARDIOTHORACIC SURGERY HISTORY AND PHYSICAL EXAM  Referring Provider is Tonny Bollman, MD PCP is Pamelia Hoit, MD    Chief Complaint   Patient presents with   .  Aortic Stenosis       severe AS, discuss TAVR     HPI:  Patient is a 79 year old white male with history of aortic stenosis, chronic persistent atrial fibrillation, type II diabetes, and remote history of tobacco use who Has been followed for years by Dr. Ladona Ridgel for management of atrial fibrillation. He has developed aortic stenosis which has progressed in severity on followup echocardiography and which has been associated with development of symptoms of exertional shortness of breath and fatigue.  Most recent echocardiogram demonstrates the presence of critical aortic stenosis with mean transvalvular gradient estimated 66 mmHg and peak velocity greater than 5 m/s.  Cardiac catheterization confirmed the presence of severe aortic stenosis with mean gradient measured 51 mmHg and was also notable for the absence of significant coronary artery disease.  There was moderate pulmonary hypertension and low normal left ventricular systolic function. The patient was referred to Dr. Tyrone Sage to consider conventional aortic valve replacement but the patient was felt to be extremely poor candidate for surgery do to the presence of severe calcification of the entire ascending thoracic aorta. 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  During the course of his work up he has been evaluated comprehensively by a multidisciplinary team of specialists coordinated through the Multidisciplinary Heart Valve Clinic in the [facility].   The patient has been counseled extensively as to the relative risks and benefits of all options for the treatment of severe aortic stenosis including long term medical  therapy, conventional surgery for aortic valve replacement, and transcatheter aortic valve replacement.  The patient has been independently evaluated by two cardiac surgeons including Dr. Tyrone Sage and myself, and both of [place] feel that the patient would be a poor candidate for conventional surgery (predicted risk of mortality >15% and/or predicted risk of permanent morbidity >50%) because of comorbidities including most notably his severely calcified, porcelain aorta.  Based upon review of all of the patient's preoperative diagnostic tests he is felt to be candidate for transcatheter aortic valve replacement using the transapical approach as an alternative to high risk conventional surgery.  The patient has been counseled at length regarding the indications, risks and potential benefits of surgery.  All questions have been answered, and the patient now presents for surgery having provided full informed consent for the operation as planned.  The patient describes symptoms of shortness of breath with moderate and strenuous physical exertion. He denies resting shortness of breath, PND, orthopnea, and lower extremity edema. He has long-standing atrial fibrillation and occasional palpitations. He denies any history of chest pain, chest tightness, dizzy spells, or syncope.    Past Medical History  Diagnosis Date  . Aortic stenosis      moderate to severe with valve area 0.95  . HTN (hypertension)   . Bradycardia   . Arthritis     left wrist  . Diabetes mellitus 12/21/2012  . Hyperlipemia 12/21/2012  . H/O prostate cancer 12/21/2012  . Anticoagulated on Coumadin 12/21/2012  . Atrial fibrillation     [name], DR COOPER  . Atrial fibrillation   . Cancer   . Heart murmur   . Anemia  LOW PLATELETS OTHER DAY  PER PT    Past Surgical History  Procedure Laterality Date  . Transthoracic echocardiogram  09/04/10, 09/07/08  . Fracture surgery      left lower arm  . Cataract extraction w/ intraocular  lens  implant, bilateral      Family History  Problem Relation Age of Onset  . Diabetes Mother   . Cancer Mother 73    bone,melanoma  . Cancer Father 62    pancreatic  . Heart disease Father   . Stroke Sister   . Cancer Brother 20    ? brain cancer    Social History History  Substance Use Topics  . Smoking status: Former Smoker -- 2.00 packs/day    Types: Cigarettes    Quit date: 09/11/1976  . Smokeless tobacco: Not on file  . Alcohol Use: No    Prior to Admission medications   Medication Sig Start Date End Date Taking? Authorizing Provider  B Complex Vitamins (B COMPLEX 50) TABS Take 1 tablet by mouth daily.    Yes Historical Provider, MD  cholecalciferol (VITAMIN D) 1000 UNITS tablet Take 1,000 Units by mouth 2 (two) times daily.   Yes Historical Provider, MD  digoxin (LANOXIN) 0.25 MG tablet Take 0.25 mg by mouth daily.    Yes Historical Provider, MD  doxazosin (CARDURA) 8 MG tablet Take 8 mg by mouth at bedtime.    Yes Historical Provider, MD  glipiZIDE (GLUCOTROL) 5 MG tablet Take 5 mg by mouth 2 (two) times daily before a meal.    Yes Historical Provider, MD  hydrochlorothiazide (MICROZIDE) 12.5 MG capsule Take 12.5 mg by mouth every evening. 12/15/12  Yes Historical Provider, MD  lisinopril-hydrochlorothiazide (PRINZIDE,ZESTORETIC) 20-12.5 MG per tablet Take 2 tablets by mouth daily.    Yes Historical Provider, MD  magnesium oxide (MAG-OX) 400 MG tablet Take 400 mg by mouth 2 (two) times daily.   Yes Historical Provider, MD  metFORMIN (GLUCOPHAGE) 1000 MG tablet Take 1,000 mg by mouth 2 (two) times daily with a meal.    Yes Historical Provider, MD  metoprolol tartrate (LOPRESSOR) 25 MG tablet Take 12.5 mg by mouth 2 (two) times daily.   Yes Historical Provider, MD  Potassium Gluconate 595 MG CAPS Take 595 mg by mouth 2 (two) times daily.   Yes Historical Provider, MD  Selenium 200 MCG CAPS Take 200 mcg by mouth every morning.    Yes Historical Provider, MD  simvastatin  (ZOCOR) 40 MG tablet Take 40 mg by mouth at bedtime.     Yes Historical Provider, MD  vitamin A 8000 UNIT capsule Take 8,000 Units by mouth every morning.   Yes Historical Provider, MD  vitamin B-12 (CYANOCOBALAMIN) 1000 MCG tablet Take 1,000 mcg by mouth 2 (two) times daily.   Yes Historical Provider, MD  vitamin C (ASCORBIC ACID) 500 MG tablet Take 500 mg by mouth every morning.    Yes Historical Provider, MD  warfarin (COUMADIN) 7.5 MG tablet Take 7.5 mg by mouth daily.    Yes Historical Provider, MD  zinc gluconate 50 MG tablet Take 50 mg by mouth every morning.    Yes Historical Provider, MD    No Known Allergies   Review of Systems:              General:                      normal appetite, decreased energy, no weight gain, no weight loss,  no fever             Cardiac:                      no chest pain with exertion, no chest pain at rest, + SOB with moderate exertion, no resting SOB, no PND, no orthopnea, occasional palpitations, + arrhythmia, + atrial fibrillation, no LE edema, no dizzy spells, no syncope             Respiratory:                exertional shortness of breath, no home oxygen, no productive cough, no dry cough, no bronchitis, no wheezing, no hemoptysis, no asthma, no pain with inspiration or cough, no sleep apnea, no CPAP at night             GI:                                no difficulty swallowing, no reflux, no frequent heartburn, no hiatal hernia, no abdominal pain, + constipation, no diarrhea, no hematochezia, no hematemesis, no melena             GU:                              no dysuria,  + frequency, no urinary tract infection, no hematuria, no enlarged prostate, no kidney stones, no kidney disease             Vascular:                     no pain suggestive of claudication, no pain in feet, no leg cramps, no varicose veins, no DVT, no non-healing foot ulcer             Neuro:                         no stroke, no TIA's, no seizures, no headaches, no temporary  blindness one eye,  no slurred speech, no peripheral neuropathy, no chronic pain, no instability of gait, no memory/cognitive dysfunction             Musculoskeletal:         mild arthritis, no joint swelling, no myalgias, no difficulty walking, normal mobility               Skin:                            no rash, no itching, no skin infections, no pressure sores or ulcerations             Psych:                         no anxiety, no depression, no nervousness, no unusual recent stress             Eyes:                           no blurry vision, no floaters, no recent vision changes             ENT:  no hearing loss, no loose or painful teeth             Hematologic:               no easy bruising, no abnormal bleeding, no clotting disorder, no frequent epistaxis             Endocrine:                   + diabetes                           Physical Exam:              BP 144/64  Pulse 65  Resp 18  Ht 5\' 8"  (1.727 m)  Wt 184 lb (83.462 kg)  BMI 27.98 kg/m2  SpO2 98%             General:                        well-appearing             HEENT:                       Unremarkable               Neck:                           no JVD, no bruits, no adenopathy               Chest:                         clear to auscultation, symmetrical breath sounds, no wheezes, no rhonchi               CV:                              Irregular rate and rhythm, grade III/VI crescendo/decrescendo systolic murmur               Abdomen:                    soft, non-tender, no masses               Extremities:                 warm, well-perfused, pulses diminished, no LE edema             Rectal/GU                   Deferred             Neuro:                         Grossly non-focal and symmetrical throughout             Skin:                            Clean and dry, no rashes, no breakdown     Diagnostic Tests:  Transthoracic Echocardiography  Patient: Krishay, Faro MR #: [id number] Study Date: 09/16/2012 Gender: M Age: 36 Height: 175.3cm Weight: 84.8kg BSA: 2.69m^2 Pt. Status:  Room:  Armond Hang, Sharlot Gowda REFERRING Lewayne Bunting ATTENDING Nahser, Philip SONOGRAPHER Luvenia Redden, RDCS PERFORMING [facility], [facility] cc:  ------------------------------------------------------------ LV EF: 55% - 60%  ------------------------------------------------------------ Indications: 424.1 Aortic valve disorders.  ------------------------------------------------------------ History: PMH: Acquired from the patient and from the patient's chart. Exertional dyspnea. Atrial fibrillation. Right bundle branch block. Aortic valve disease. Risk factors: Hypertension. Dyslipidemia.  ------------------------------------------------------------ Study Conclusions  - Left ventricle: The cavity size was mildly dilated. Wall thickness was increased in a pattern of mild LVH. Systolic function was normal. The estimated ejection fraction was in the range of 55% to 60%. - Aortic valve: There was severe stenosis. Mild regurgitation. - Left atrium: The atrium was moderately to severely dilated. - Right ventricle: The cavity size was mildly dilated. Systolic function was mildly reduced. - Right atrium: The atrium was severely dilated. - Pulmonary arteries: Systolic pressure was moderately increased. PA peak pressure: 55mm Hg (S). Transthoracic echocardiography. M-mode, complete 2D, spectral Doppler, and color Doppler. Height: Height: 175.3cm. Height: 69in. Weight: Weight: 84.8kg. Weight: 186.6lb. Body mass index: BMI: 27.6kg/m^2. Body surface area: BSA: 2.23m^2. Blood pressure: 130/70. Patient status: Outpatient. Location: [place] [facility]  ------------------------------------------------------------  ------------------------------------------------------------ Left ventricle: The cavity size was mildly dilated. Wall thickness was increased in a pattern of  mild LVH. Systolic function was normal. The estimated ejection fraction was in the range of 55% to 60%.  ------------------------------------------------------------ Aortic valve: The aortic valve gradient may be overestimated in the presence of aortic insufficiency. Moderately thickened, moderately calcified leaflets. Doppler: There was severe stenosis. Mild regurgitation. VTI ratio of LVOT to aortic valve: 0.21. Indexed valve area: 0.24cm^2/m^2 (VTI). Peak velocity ratio of LVOT to aortic valve: 0.18. Indexed valve area: 0.2cm^2/m^2 (Vmax). Mean gradient: 66mm Hg (S). Peak gradient: Hg (S).  ------------------------------------------------------------ Aorta: Aortic root: The aortic root was normal in size. Ascending aorta: The ascending aorta was normal in size.  ------------------------------------------------------------ Mitral valve: Mildly thickened leaflets . Doppler: Trivial regurgitation.  ------------------------------------------------------------ Left atrium: The atrium was moderately to severely dilated.  ------------------------------------------------------------ Right ventricle: The cavity size was mildly dilated. Systolic function was mildly reduced.  ------------------------------------------------------------ Pulmonic valve: Doppler: No significant regurgitation.  ------------------------------------------------------------ Tricuspid valve: Doppler: Mild regurgitation.  ------------------------------------------------------------ Pulmonary artery: Systolic pressure was moderately increased.  ------------------------------------------------------------ Right atrium: The atrium was severely dilated.  ------------------------------------------------------------ Pericardium: There was no pericardial effusion.  ------------------------------------------------------------ Systemic veins: Inferior vena cava: The vessel was dilated; the respirophasic  diameter changes were blunted (< 50%); findings are consistent with elevated central venous pressure.  ------------------------------------------------------------  2D measurements Normal Doppler measurements Norma Left ventricle l LVID ED, 54.3 mm 43-52 Main pulmonary artery chord, Pressure, 55 mm Hg =30 PLAX S LVID ES, 31.8 mm 23-38 LVOT chord, Peak vel, 97 cm/s ----- PLAX S FS, chord, 41 % >29 VTI, S 27. cm ----- PLAX 7 LVPW, ED 12.7 mm ------ Aortic valve IVS/LVPW 0.99 <1.3 Peak vel, 545 cm/s ----- ratio, ED S Ventricular septum Mean vel, 367 cm/s ----- IVS, ED 12.6 mm ------ S LVOT VTI, S 132 cm ----- Diam, S 17 mm ------ Mean 66 mm Hg ----- Area 2.27 cm^2 ------ gradient, Aorta S Root diam, 26 mm ------ Peak 119 mm Hg ----- ED gradient, Left atrium S AP dim 57 mm ------ VTI ratio 0.2 ----- AP dim 2.84 cm/m^2 <2.2 LVOT/AV 1 index Area index 0.2 cm^2/m^2 ----- (VTI) 4 Peak vel 0.1 ----- ratio, 8 LVOT/AV Area index 0.2 cm^2/m^2 ----- (Vmax) Mitral valve Max regurg 731 cm/s ----- vel Regurg  VTI 238 cm ----- Tricuspid valve Regurg 352 cm/s ----- peak vel Peak RV-RA 50 mm Hg ----- gradient, S Systemic veins Estimated 5 mm Hg ----- CVP Right ventricle Pressure, 55 mm Hg <30 S  ------------------------------------------------------------ Prepared and Electronically Authenticated by  Kristeen Miss 2013-11-14T11:[id number]  Cardiac Catheterization Procedure Note  Name: JEEVAN KALLA   MRN: [id number]   DOB: 1933-02-04   Procedure: Right Heart Cath, Left Heart Cath, Selective Coronary Angiography, LV angiography   Indication: Severe aortic stenosis, symptomatic   Procedural Details: The right groin was prepped, draped, and anesthetized with 1% lidocaine. Using the modified Seldinger technique a 4 French sheath was placed in the right femoral artery and a 7 French sheath was placed in the right femoral vein. A [name]-Ganz catheter was used for the right  heart catheterization. Standard protocol was followed for recording of right heart pressures and sampling of oxygen saturations. Fick cardiac output was calculated. Standard Judkins catheters were used for selective coronary angiography and left ventriculography. A straight wire directed with an AL-1 catheter was used to cross the aortic valve. A pigtail was inserted over an exchange-length J-wire. There were no immediate procedural complications. The patient was transferred to the post catheterization recovery area for further monitoring.   Procedural Findings:   Hemodynamics   RA 13   RV 64/8   PA 69/20 mean 41   PCWP 21   LV 216/22   AO 151/58 mean 89   Oxygen saturations:   PA 62   AO 95   Ao valve hemodynamics:   Mean gradient: 51 mmHg, Ao valve area 0.4 square cm   Cardiac Output (Fick) 3.7   Cardiac Index (Fick) 1.8   Coronary angiography:   Coronary dominance: right   Left mainstem: patent, calcified, no obstructive disease   Left anterior descending (LAD): Diffuse irregularity, mildly calcified, reaches the LV apex.30-40% stenosis at the diagonal origin. Diags are widely patent. No significant obstructive disease throughout the LAD distribution.   Left circumflex (LCx): Patent throughout. Intermediate branch and OM1 have minor nonobstructive disease without significant stenosis.   Right coronary artery (RCA): Large, dominant vessel. PDA and PLA branches are patent without stenosis.The mid-vessel has 20-30% stenosis.   Aortic valve: severe calcification throughout the valve leaflets.   Left ventriculography: Left ventricular systolic function is normal, LVEF is low-normal with LVEF estimated at 55%, there is no significant mitral regurgitation   Ao root angiography: Mild AI, heavily calcified ascending aorta   Final Conclusions:   1. Nonobstructive CAD as outlined above   2. Low-normal LV systolic function   3. Critical aortic stenosis   4. Pulmonary hypertension, likely related  to chronic left heart disease.   Recommendations: Surgical evaluation for aortic valve replacement. He has been very reluctant to pursue surgical AVR, but surgical risk is not high enough to consider TAVR. 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 Will refer him to Dr Tyrone Sage who operated on the patient's wife in the past.   Tonny Bollman   12/16/2012, 12:03 PM   CT ANGIOGRAPHY CHEST  Technique: Multidetector CT imaging of the chest using the   standard protocol during bolus administration of intravenous   contrast. Multiplanar reconstructed images including MIPs were   obtained and reviewed to evaluate the vascular anatomy.   Contrast: OMNIPAQUE IOHEXOL 350 MG/ML SOLN   Comparison: None.   Findings: There is extensive calcification within the aortic valve.   There  is also circumferential calcification in the ascending aorta   extending to the level of the proximal aortic arch. Aortic wall   calcification is less prominent in the aortic arch and descending   aorta. No evidence of aortic dissection. Maximal diameter of the   ascending aorta is 3.8 cm. Mild atherosclerotic plaque in the   descending aorta.   Innominate artery, right subclavian artery, left common carotid   artery, left subclavian artery are patent. Right internal carotid   artery origin is tortuous but patent. Left vertebral artery is   grossly patent. Right vertebral artery is diminutive and poorly   visualized.   No obvious filling defects are present in the pulmonary arterial   tree to suggest acute pulmonary thromboembolism.   15 mm short axis diameter precarinal lymph node. Scattered smaller   nodes are seen within the mediastinum. Negative abnormal hilar   adenopathy.   Calcifications of the right coronary, left anterior descending   coronary, and circumflex coronary artery are present. Mild mitral   annular calcification. The heart is  markedly enlarged.   No pneumothorax. No pleural effusion.   Cystic changes at the left lung base have a chronic appearance. No   cavitary lung mass. No consolidation.   Diffuse hepatic steatosis. Upper abdomen is otherwise benign.   No destructive bone lesion. Scattered degenerative changes in the   thoracic spine are noted.   IMPRESSION:   There is extensive aortic valve calcifications as well as   circumferential calcification in the ascending aorta to the   proximal arch. Less prominent calcification is present in the arch   and descending aorta with mild scattered plaque in the descending   aorta.   No evidence of significant stenosis in the great vessels. The   right vertebral artery, however is poorly visualized.   Single mildly enlarged mediastinal lymph node. Differential   diagnosis includes volume overload, inflammatory disease, and less   likely malignancy given lack of pulmonary nodules or other evidence   of metastatic disease. Consider 59-month follow-up to ensure   resolution.   Three-vessel coronary artery calcifications.   Diffuse hepatic steatosis   Marked cardiomegaly.   Original Report Authenticated By: Jolaine Click, M.D.    CT ANGIOGRAPHY OF THE HEART, CORONARY ARTERY, STRUCTURE, AND   MORPHOLOGY   COMPARISON: No priors.   CONTRAST: 80mL OMNIPAQUE IOHEXOL 350 MG/ML SOLN   TECHNIQUE: CT angiography of the coronary vessels was performed on   a 256 channel system using prospective ECG gating. A scout and ECG-   gated noncontrast exam (for calcium scoring) were performed.   Appropriate delay was determined by bolus tracking after injection   of iodinated contrast, and an ECG-gated coronary CTA was performed   with sub-mm slice collimation throughout the cardiac cycle.   Imaging post processing was performed on an independent workstation   creating multiplanar and 3-D images, allowing for quantitative   analysis of the heart and coronary arteries. Note that this  exam   targets the heart and the chest was not imaged in its entirety.   PREMEDICATION:   None.   FINDINGS:   Technical quality: Good.   Heart rate: 55-68 bpm (patient was in atrial fibrillation).   CORONARY ARTERIES: Assessment not performed secondary to the   excessive imaging noise, small amount of motion related artifact   (related to irregular rhythm) and high degree of coronary artery   calcification. The patient has had a recent catheter angiogram as   well.  Aortic Root Measurements Pertinent to Potential TAVR Procedure:   Aortic Valve Description: The aortic valve was tricuspid with   severe thickening and calcification of all of the cusps. During   systole, there was limited valve opening with an estimated aortic   valve area of approximately 0.84 cm2 by planimetry.   Aortic Valve Area: 0.84 cm-sq   Annulus:   Planimetry - 5.6 cm2   Long & Short Axis - 31.0 x 25.2 mm   Circumference - 87.3 mm   Sinuses of Valsalva:   R SOV - width 34.1 mm   height 26.8 mm   L SOV - width 33.5 mm   height 20.3 mm   [place] SOV - width 33.6 mm   height 21.0 mm   Coronary Artery [place]:   L main - 15.2 mm from the annulus   RCA - 19.8 mm from the annulus   Ascending Aorta:   33.1 x 32.0 mm in diameter 4 cm above the annulus.   Potential Angiographic Angles:   LAO 11 degrees, Caudal 7 degrees   RAO 10 degrees, Caudal 28 degrees   OTHER AORTA AND PULMONARY MEASUREMENTS:   Descending aorta: (< 40 mm): 31 mm   Main pulmonary artery: (< 30 mm): 28 mm   OTHER FINDINGS:   Within the visualized portions of the thorax there are no   suspicious appearing pulmonary nodules, areas of airspace   consolidation or pleural effusions. Mild diffuse bronchial wall   thickening and some mild paraseptal emphysematous changes are   noted. There is no significant pericardial fluid, thickening or   pericardial calcification. Numerous borderline enlarged and mildly   enlarged mediastinal lymph nodes are  noted, largest of which   measures up to 1.3 cm in short axis in the subcarinal station.   There are no aggressive appearing lytic or blastic lesions noted in   the visualized portions of the skeleton.   IMPRESSION:   1. Severely sclerotic aortic valve with an estimated aortic valve   area of 0.84 cm2 by planimetry. Findings and measurements   pertinent to potential TAVR procedure, as detailed above.   2. Numerous borderline enlarged and mildly enlarged mediastinal   lymph nodes are nonspecific. Similar findings were seen in the   abdomen and pelvis (see that report for discussion and details).   Clinical correlation is recommended. Another differential   consideration would include an underlying lymphoproliferative   disorder.   3. Mild diffuse bronchial wall thickening with mild paraseptal   emphysematous changes.   Original Report Authenticated By: Trudie Reed, M.D.    CT ANGIOGRAPHY ABDOMEN AND PELVIS  Technique: Multidetector CT imaging of the abdomen and pelvis was   performed using the standard protocol during bolus administration   of intravenous contrast. Multiplanar reconstructed images   including MIPs were obtained and reviewed to evaluate the vascular   anatomy.   Contrast: 80mL OMNIPAQUE IOHEXOL 350 MG/ML SOLN   Comparison: No priors.   Findings:   VASCULAR MEASUREMENTS PERTINENT TO TAVR:   AORTA:   Minimal Aortic Diameter - 13.1 x 12.9 mm (shortly above the aortic   bifurcation)   Severity of Aortic Calcification - Severe   RIGHT PELVIS:   Right Common Iliac Artery -   Minimal Diameter - 7.8 x 7.6 mm   Tortuosity - Mild   Calcification - Severe   Right External Iliac Artery -   Minimal Diameter - 7.3 x 7.5 mm   Tortuosity - Moderate  Calcification - Moderate to severe   Right Common Femoral Artery -   Minimal Diameter - 9.0 x 4.5 mm   Tortuosity - Mild   Calcification - Moderate   LEFT PELVIS:   Left Common Iliac Artery -   Minimal Diameter - 8.4 x  6.4 mm   Tortuosity - Mild   Calcification - Severe   Left External Iliac Artery -   Minimal Diameter - 9.5 x 5.3 mm   Tortuosity - Moderate   Calcification - Moderate to severe   Left Common Femoral Artery -   Minimal Diameter - 9.2 x 5.8 mm   Tortuosity - Mild   Calcification - Mild to moderate   OTHER FINDINGS   Lung Bases: Changes of paraseptal emphysema and diffuse bronchial   wall thickening in the lung bases bilaterally.   Abdomen/Pelvis: The appearance of the liver, gallbladder, spleen,   bilateral adrenal glands and the left kidney is unremarkable.   Multiple small foci of intraparenchymal fat in the pancreatic head   (a benign finding). Remainder the pancreas is otherwise normal in   appearance. In the upper pole of the right kidney there is an 8 mm   low attenuation lesion which is incompletely characterized on   today's examination, but statistically favored to represent a small   cyst. There is extensive atherosclerosis throughout the abdominal   aorta and the visceral vasculature, most notably at the origin of   the celiac axis where there is greater than 50% diameter stenosis.   No aneurysm or dissection.   Normal appendix. No significant volume of ascites. No   pneumoperitoneum. No pathologic distension of small bowel. There   are a few scattered colonic diverticula, without surrounding   inflammatory changes to suggest an acute colonic diverticulitis at   this time. Fiducial markers are noted in the region of the   prostate gland suggesting prior radiation therapy. There are   multiple borderline enlarged and mildly enlarged retroperitoneal   lymph nodes in the periaortic stations, measuring up to 11 mm in   short axis.   Musculoskeletal: There are no aggressive appearing lytic or blastic   lesions noted in the visualized portions of the skeleton.   Multilevel degenerative disc disease and facet arthropathy is noted   throughout the lumbar spine. At L4-L5 there  is 4 mm of   anterolisthesis of L4 upon L5. L1-L2 there is a 4 mm   retrolisthesis of L1 upon L2.   Review of the MIP images confirms the above findings.   IMPRESSION:   1. Vascular findings pertinent to potential TAVR procedure, as   above. Notably, the patient does not appear to be a suitable   candidate for pelvic vascular access for placement of a Sapien   valve, but may be a potential candidate for placement of a CORE   valve.   2. Possible hemodynamically significant stenosis at the ostium of   the celiac axis.   3. Multiple borderline enlarged and minimally enlarged   retroperitoneal lymph nodes are nonspecific. However, given the   fiducial markers in the region of the prostate gland, clinical   correlation is recommended with consideration for assessment of PSA   levels and potential further evaluation with PET CT if there is   clinical concern for metastatic disease.   4. Normal appendix.   5. Additional incidental findings, as above.   Original Report Authenticated By: Trudie Reed, M.D.   6 Minute Walk Test Results  Patient: SKYELER SMOLA   Date: 01/01/2013   Supplemental O2 during test? NO   Baseline End  Time 1000 1006   Heartrate 65 72   Dyspnea MILD MODERATE   Fatigue MILD MODERATE   O2 sat 97% 96%   Blood pressure 147/63 142/65   Patient ambulated at a STEADY pace for a total distance of 1000 feet with 0 stops.   Ambulation was limited primarily due to mild shortness of breath.   Overall the test was tolerated well.    Pulmonary Function Tests   Baseline Post-bronchodilator   FVC 2.73 L (75% predicted) FVC 2.93 L (80% predicted)   FEV1 1.95 L (75% predicted) FEV1 2.23 L (86% predicted)   FEF25-75 1.24 L (68% predicted) FEF25-75 2.17 L (120% predicted)   RV 2.66 L (108% predicted)   DLCO 78% predicted     STS Risk Calculator  Procedure                                         AVR  Risk of Mortality                                 3.1% Morbidity or Mortality                       18.2% Prolonged LOS                                   7.3% Short LOS                                           30% Permanent Stroke                            1.7% Prolonged Vent Support                      9.0% DSW Infection                                     0.24% Renal Failure                                       4.6% Reoperation                                        8.8%     Impression:  The patient has severe symptomatic aortic stenosis with mild left ventricular dysfunction. 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 Based upon review of the patient's recent cardiac gated CT angiogram of the heart the patient appears to be a reasonably good candidate for transcatheter aortic valve replacement as an alternative. CT angiogram  of the abdomen and pelvis demonstrated the presence of extensive dense calcification and diffuse narrowing throughout these vessels, precluding use of the transfemoral approach with currently available devices. I feel that the trans-apical approach for TAVR appears to be the best treatment alternative.   Plan:  I spent in excess of 30 minutes discussing treatment alternatives with the patient in the valve clinic in conjunction with Dr. Excell Seltzer. We reviewed the indications, risks, and potential benefits of transcatheter aortic valve replacement using the transapical approach as an alternative to conventional surgery.     Following the decision to proceed with transcatheter aortic valve replacement, a discussion has been held regarding what types of management strategies would be attempted intraoperatively in the event of life-threatening complications, including whether or not the patient would be considered a candidate for the use of  cardiopulmonary bypass and/or conversion to open sternotomy for attempted surgical intervention.  The patient has been advised of a variety of complications that might develop including but not limited to risks of death, stroke, paravalvular leak, aortic dissection or other major vascular complications, aortic annulus rupture, device embolization, cardiac rupture or perforation, mitral regurgitation, acute myocardial infarction, arrhythmia, heart block or bradycardia requiring permanent pacemaker placement, congestive heart failure, respiratory failure, renal failure, pneumonia, infection, other late complications related to structural valve deterioration or migration, or other complications that might ultimately cause a temporary or permanent loss of functional independence or other long term morbidity.  The patient provides full informed consent for the procedure as planned and all questions were answered.  We plan to proceed with surgery on Tuesday, March 18. The patient has been instructed to stop taking Coumadin on Thursday, March 13 in anticipation of surgery. He'll stop taking metformin on Sunday, March 16.   Summary of Diagnostic Tests Pertinent to TAVR:  Transthoracic Echocardiogram:   Aortic valve peak velocity:  5.45 m/sec  Aortic valve mean gradient: 66 mmHg  Aortic insufficiency:   mild  LV ejection fraction:   55%  Mitral regurgitation:   trace  Other significant findings:  Severe calcification   Diagnostic Cardiac Catheterization:   Hemodynamics:   Aortic valve mean gradient: 51 mmHg   PA pressure:    69/20 mmHg   PCW pressure:   21 mmHg   RA pressure:    13 mmHg   Cardiac output:   3.7 L/min by Fick   Estimated aortic valve area: 0.40 cm2   Coronary circulation:  Non-obstructive CAD   Cardiac Gated CTA of the Heart:   Aortic annulus area:   516 cm2  Aortic annulus dimensions:  22 x 27 mm  Left main coronary height:  13.9 mm  Right coronary height:  17.8 mm  STJ  height:    27.3 mm  STJ diameter:   27.2 mm  Sinus of Valsalva diameter:  32.2 mm  Anatomical concerns:  Severe calcification with napkin ring calcification at the relatively narrow STJ   Predicted valve size:  26mm  % under/oversizing:   +2.8%  Valve Prep:    standard   CTA of abdomen/pelvis:   Patient is not suitable for transfemoral access for TAVR   Patient is suitable for placement of IABP or cannulas for CPB via the left femoral artery  Patient will have diagnostic catheters placed into the aortic root via the right femoral artery   TAVR Procedure Plan:  26 mm Sapien THV placed via transapical approach with standard balloon aortic valvuloplasty and standard deployment preparation.  The patient is considered  a candidate for use of CPB via the left transfemoral approach if necessary.  The patient is considered a candidate for emergency median sternotomy for bail out if necessary.    OWEN,CLARENCE H 01/14/2013 2:59 PM

## 2013-01-15 ENCOUNTER — Encounter (HOSPITAL_COMMUNITY)
Admission: RE | Admit: 2013-01-15 | Discharge: 2013-01-15 | Disposition: A | Discharge status: Home / Self Care | Payer: Medicare Other | Source: Ambulatory Visit | Attending: Thoracic Surgery (Cardiothoracic Vascular Surgery) | Admitting: Thoracic Surgery (Cardiothoracic Vascular Surgery)

## 2013-01-15 ENCOUNTER — Encounter (HOSPITAL_COMMUNITY): Payer: Self-pay

## 2013-01-15 ENCOUNTER — Ambulatory Visit (HOSPITAL_COMMUNITY)
Admission: RE | Admit: 2013-01-15 | Discharge: 2013-01-15 | Disposition: A | Discharge status: Home / Self Care | Payer: Medicare Other | Source: Ambulatory Visit | Attending: Thoracic Surgery (Cardiothoracic Vascular Surgery) | Admitting: Thoracic Surgery (Cardiothoracic Vascular Surgery)

## 2013-01-15 VITALS — BP 134/56 | HR 80 | Temp 98.7°F | Resp 20 | Ht 68.5 in | Wt 182.7 lb

## 2013-01-15 DIAGNOSIS — Z01812 Encounter for preprocedural laboratory examination: Secondary | ICD-10-CM | POA: Insufficient documentation

## 2013-01-15 DIAGNOSIS — I517 Cardiomegaly: Secondary | ICD-10-CM | POA: Insufficient documentation

## 2013-01-15 DIAGNOSIS — Z01810 Encounter for preprocedural cardiovascular examination: Secondary | ICD-10-CM | POA: Insufficient documentation

## 2013-01-15 DIAGNOSIS — I359 Nonrheumatic aortic valve disorder, unspecified: Secondary | ICD-10-CM

## 2013-01-15 DIAGNOSIS — Z01811 Encounter for preprocedural respiratory examination: Secondary | ICD-10-CM | POA: Insufficient documentation

## 2013-01-15 LAB — CBC
HCT: 41.1 % (ref 39.0–52.0)
MCV: 91.9 fL (ref 78.0–100.0)
RBC: 4.47 MIL/uL (ref 4.22–5.81)
WBC: 6.8 10*3/uL (ref 4.0–10.5)

## 2013-01-15 LAB — URINALYSIS, ROUTINE W REFLEX MICROSCOPIC
Ketones, ur: NEGATIVE mg/dL
Leukocytes, UA: NEGATIVE
Nitrite: NEGATIVE
Protein, ur: NEGATIVE mg/dL
pH: 7 (ref 5.0–8.0)

## 2013-01-15 LAB — COMPREHENSIVE METABOLIC PANEL
BUN: 28 mg/dL — ABNORMAL HIGH (ref 6–23)
CO2: 26 mEq/L (ref 19–32)
Chloride: 98 mEq/L (ref 96–112)
Creatinine, Ser: 1.09 mg/dL (ref 0.50–1.35)
GFR calc Af Amer: 73 mL/min — ABNORMAL LOW (ref >90)
GFR calc non Af Amer: 63 mL/min — ABNORMAL LOW (ref >90)
Total Bilirubin: 0.9 mg/dL (ref 0.3–1.2)

## 2013-01-15 NOTE — Progress Notes (Signed)
Lab Tech unable to get ABG's x 2 sticks, need to get stat day of surgery.

## 2013-01-15 NOTE — Pre-Procedure Instructions (Signed)
GUNNARD DORRANCE  01/15/2013    Your procedure is scheduled on:  Tuesday January 19, 2013  Report to [facility] at 0530 AM.  Call this number if you have problems the morning of surgery: [phone]   Remember:   Do not eat food or drink liquids after midnight.Monday   Take these medicines the morning of surgery with A SIP OF WATER: Metoprolol(Lopressor)   Do not wear jewelry.  Do not wear lotions, or powders. You may wear deodorant.   Men may shave face and neck.  Do not bring valuables to the hospital.  Contacts, dentures or bridgework may not be worn into surgery.  Leave suitcase in the car. After surgery it may be brought to your room.  For patients admitted to the hospital, checkout time is 11:00 AM the day of  discharge.   Patients discharged the day of surgery will not be allowed to drive  home.    Special Instructions: Incentive Spirometry - Practice and bring it with you on the day of surgery. Shower using CHG 2 nights before surgery and the night before surgery.  If you shower the day of surgery use CHG.  Use special wash - you have one bottle of CHG for all showers.  You should use approximately 1/3 of the bottle for each shower.   Please read over the following fact sheets that you were given: Pain Booklet, Coughing and Deep Breathing, Blood Transfusion Information, MRSA Information and Surgical Site Infection Prevention

## 2013-01-16 LAB — HEMOGLOBIN A1C
Hgb A1c MFr Bld: 7.6 % — ABNORMAL HIGH (ref <5.7)
Mean Plasma Glucose: 171 mg/dL — ABNORMAL HIGH (ref <117)

## 2013-01-18 MED ORDER — DEXTROSE 5 % IV SOLN
1.5000 g | INTRAVENOUS | Status: AC
  Administered 2013-01-19: 1.5 g via INTRAVENOUS
  Filled 2013-01-18 (×2): qty 1.5

## 2013-01-18 MED ORDER — PHENYLEPHRINE HCL 10 MG/ML IJ SOLN
30.0000 ug/min | INTRAVENOUS | Status: DC
  Filled 2013-01-18: qty 2

## 2013-01-18 MED ORDER — NITROGLYCERIN IN D5W 200-5 MCG/ML-% IV SOLN
2.0000 ug/min | INTRAVENOUS | Status: AC
  Administered 2013-01-19: 16 ug/min via INTRAVENOUS
  Filled 2013-01-18: qty 250

## 2013-01-18 MED ORDER — CHLORHEXIDINE GLUCONATE 4 % EX LIQD: 30.0000 mL | CUTANEOUS | Status: DC

## 2013-01-18 MED ORDER — MAGNESIUM SULFATE 50 % IJ SOLN
40.0000 meq | INTRAMUSCULAR | Status: DC
  Filled 2013-01-18: qty 10

## 2013-01-18 MED ORDER — SODIUM CHLORIDE 0.9 % IV SOLN
INTRAVENOUS | Status: DC
  Filled 2013-01-18: qty 1

## 2013-01-18 MED ORDER — VERAPAMIL HCL 2.5 MG/ML IV SOLN
INTRAVENOUS | Status: DC
  Filled 2013-01-18: qty 2.5

## 2013-01-18 MED ORDER — SODIUM CHLORIDE 0.9 % IV SOLN
INTRAVENOUS | Status: AC
  Filled 2013-01-18 (×2): qty 1

## 2013-01-18 MED ORDER — VANCOMYCIN HCL 10 G IV SOLR
1250.0000 mg | INTRAVENOUS | Status: AC
  Administered 2013-01-19: 1250 mg via INTRAVENOUS
  Filled 2013-01-18: qty 1250

## 2013-01-18 MED ORDER — DOPAMINE-DEXTROSE 3.2-5 MG/ML-% IV SOLN
2.0000 ug/kg/min | INTRAVENOUS | Status: DC
  Filled 2013-01-18: qty 250

## 2013-01-18 MED ORDER — SODIUM CHLORIDE 0.9 % IV SOLN
INTRAVENOUS | Status: DC
  Filled 2013-01-18: qty 40

## 2013-01-18 MED ORDER — POTASSIUM CHLORIDE 2 MEQ/ML IV SOLN
80.0000 meq | INTRAVENOUS | Status: DC
  Filled 2013-01-18: qty 40

## 2013-01-18 MED ORDER — METOPROLOL TARTRATE 12.5 MG HALF TABLET: 12.5000 mg | ORAL_TABLET | Freq: Once | ORAL | Status: DC

## 2013-01-18 MED ORDER — DEXMEDETOMIDINE HCL IN NACL 400 MCG/100ML IV SOLN
0.1000 ug/kg/h | INTRAVENOUS | Status: DC
  Filled 2013-01-18 (×2): qty 100

## 2013-01-18 MED ORDER — EPINEPHRINE HCL 1 MG/ML IJ SOLN
0.5000 ug/min | INTRAVENOUS | Status: DC
  Filled 2013-01-18: qty 4

## 2013-01-18 MED ORDER — NOREPINEPHRINE BITARTRATE 1 MG/ML IJ SOLN
2.0000 ug/min | INTRAMUSCULAR | Status: DC
  Filled 2013-01-18: qty 8

## 2013-01-18 MED ORDER — DEXTROSE 5 % IV SOLN
750.0000 mg | INTRAVENOUS | Status: DC
  Filled 2013-01-18: qty 750

## 2013-01-18 NOTE — Progress Notes (Signed)
Anesthesia chart: Patient is a 79 year old male scheduled for TAVR - transapical for severe AS on 01/19/13.  He was felt to be extremely poor candidate for conventional AVR surgery do to the presence of severe calcification of the entire ascending thoracic aorta.  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  OSA screening score was a 4 on 12/24/12.  PCP is Dr. Benedetto Goad.  Primary cardiologist is Dr. Ladona Ridgel.    EKG on 01/15/13 showed afib @ 77 bpm, right BBB, LAFB, bifascicular block.  Echo on 09/16/12 showed: - Left ventricle: The cavity size was mildly dilated. Wall thickness was increased in a pattern of mild LVH. Systolic function was normal. The estimated ejection fraction was in the range of 55% to 60%. - Aortic valve: There was severe stenosis. Mild regurgitation. - Left atrium: The atrium was moderately to severely dilated. - Right ventricle: The cavity size was mildly dilated. Systolic function was mildly reduced. - Right atrium: The atrium was severely dilated. - Pulmonary arteries: Systolic pressure was moderately increased. PA peak pressure: 55mm Hg (S).  Cardiac cath on 12/16/12 showed non-obstructive CAD (LM: calcified without obstructive disease. LAD: diffuse irregularity, reaches LV apex.30-40% stenosis at the diagonal origin. Diags are widely patent. No significant obstructive disease throughout the LAD distribution. LCX: Patent.  Intermediate branch and OM1 with minor non-obstructive disease.  RCA: 20-30% mid stenosis.  PDA/PLA patent.) Low-normal LV systolic function.  Critical AS.  Pulmonary HTN.  Carotid duplex on 12/24/12 showed no significant ICA stenosis.  Cardiac CT and CTA of the abd/pelvis from 12/30/12 can be viewed under the Results Review tab.  CXR on 01/15/13 showed: No acute  abnormalities. Chronic bronchitic changes. Borderline chronic cardiomegaly.  PFT on 2/20/14showed: Baseline Post-bronchodilator  FVC 2.73 L (75% predicted) FVC 2.93 L (80% predicted)  FEV1 1.95 L (75% predicted) FEV1 2.23 L (86% predicted)  FEF25-75 1.24 L (68% predicted) FEF25-75 2.17 L (120% predicted)  RV 2.66 L (108% predicted)  DLCO 78% predicted   Preoperative labs noted.  Repeat PT/PTT on arrival.  An ABG is also scheduled for the day of surgery, as the lab technician was unable to obtain following two sticks at his PAT visit.  Velna Ochs [facility]/Anesthesiology Phone [phone] 01/18/2013 9:46 AM

## 2013-01-19 ENCOUNTER — Inpatient Hospital Stay (HOSPITAL_COMMUNITY)
Admission: RE | Admit: 2013-01-19 | Discharge: 2013-01-22 | DRG: 220 | Disposition: A | Discharge status: Home / Self Care | Payer: Medicare Other | Source: Ambulatory Visit | Attending: Thoracic Surgery (Cardiothoracic Vascular Surgery) | Admitting: Thoracic Surgery (Cardiothoracic Vascular Surgery)

## 2013-01-19 ENCOUNTER — Inpatient Hospital Stay (HOSPITAL_COMMUNITY): Payer: Medicare Other

## 2013-01-19 ENCOUNTER — Encounter (HOSPITAL_COMMUNITY)
Admission: RE | Disposition: A | Discharge status: Home / Self Care | Payer: Self-pay | Source: Ambulatory Visit | Attending: Thoracic Surgery (Cardiothoracic Vascular Surgery)

## 2013-01-19 ENCOUNTER — Encounter (HOSPITAL_COMMUNITY): Payer: Self-pay | Admitting: Vascular Surgery

## 2013-01-19 ENCOUNTER — Inpatient Hospital Stay (HOSPITAL_COMMUNITY): Payer: Medicare Other | Admitting: Anesthesiology

## 2013-01-19 ENCOUNTER — Encounter (HOSPITAL_COMMUNITY): Payer: Self-pay | Admitting: Anesthesiology

## 2013-01-19 DIAGNOSIS — D62 Acute posthemorrhagic anemia: Secondary | ICD-10-CM | POA: Diagnosis not present

## 2013-01-19 DIAGNOSIS — Z833 Family history of diabetes mellitus: Secondary | ICD-10-CM

## 2013-01-19 DIAGNOSIS — I700 Atherosclerosis of aorta: Secondary | ICD-10-CM | POA: Diagnosis present

## 2013-01-19 DIAGNOSIS — Z8249 Family history of ischemic heart disease and other diseases of the circulatory system: Secondary | ICD-10-CM

## 2013-01-19 DIAGNOSIS — E119 Type 2 diabetes mellitus without complications: Secondary | ICD-10-CM | POA: Diagnosis present

## 2013-01-19 DIAGNOSIS — I10 Essential (primary) hypertension: Secondary | ICD-10-CM | POA: Diagnosis present

## 2013-01-19 DIAGNOSIS — Z7901 Long term (current) use of anticoagulants: Secondary | ICD-10-CM

## 2013-01-19 DIAGNOSIS — I2789 Other specified pulmonary heart diseases: Secondary | ICD-10-CM | POA: Diagnosis present

## 2013-01-19 DIAGNOSIS — E8779 Other fluid overload: Secondary | ICD-10-CM | POA: Diagnosis not present

## 2013-01-19 DIAGNOSIS — Z7982 Long term (current) use of aspirin: Secondary | ICD-10-CM

## 2013-01-19 DIAGNOSIS — Z79899 Other long term (current) drug therapy: Secondary | ICD-10-CM

## 2013-01-19 DIAGNOSIS — D696 Thrombocytopenia, unspecified: Secondary | ICD-10-CM | POA: Diagnosis not present

## 2013-01-19 DIAGNOSIS — I359 Nonrheumatic aortic valve disorder, unspecified: Secondary | ICD-10-CM

## 2013-01-19 DIAGNOSIS — Z87891 Personal history of nicotine dependence: Secondary | ICD-10-CM

## 2013-01-19 DIAGNOSIS — E785 Hyperlipidemia, unspecified: Secondary | ICD-10-CM | POA: Diagnosis present

## 2013-01-19 DIAGNOSIS — Z953 Presence of xenogenic heart valve: Secondary | ICD-10-CM

## 2013-01-19 DIAGNOSIS — Z8546 Personal history of malignant neoplasm of prostate: Secondary | ICD-10-CM

## 2013-01-19 DIAGNOSIS — I059 Rheumatic mitral valve disease, unspecified: Secondary | ICD-10-CM

## 2013-01-19 DIAGNOSIS — I4891 Unspecified atrial fibrillation: Secondary | ICD-10-CM | POA: Diagnosis present

## 2013-01-19 HISTORY — DX: Type 2 diabetes mellitus without complications: E11.9

## 2013-01-19 HISTORY — DX: Presence of xenogenic heart valve: Z95.3

## 2013-01-19 HISTORY — DX: Shortness of breath: R06.02

## 2013-01-19 HISTORY — DX: Malignant neoplasm of prostate: C61

## 2013-01-19 HISTORY — PX: CARDIAC VALVE REPLACEMENT: SHX585

## 2013-01-19 HISTORY — PX: INTRAOPERATIVE TRANSESOPHAGEAL ECHOCARDIOGRAM: SHX5062

## 2013-01-19 LAB — CBC
HCT: 33 % — ABNORMAL LOW (ref 39.0–52.0)
HCT: 33.2 % — ABNORMAL LOW (ref 39.0–52.0)
Hemoglobin: 11.5 g/dL — ABNORMAL LOW (ref 13.0–17.0)
MCH: 32.5 pg (ref 26.0–34.0)
MCHC: 34.8 g/dL (ref 30.0–36.0)
MCV: 93.8 fL (ref 78.0–100.0)
Platelets: 93 10*3/uL — ABNORMAL LOW (ref 150–400)
RBC: 3.52 MIL/uL — ABNORMAL LOW (ref 4.22–5.81)
RDW: 12.6 % (ref 11.5–15.5)
WBC: 4.9 10*3/uL (ref 4.0–10.5)
WBC: 7.4 10*3/uL (ref 4.0–10.5)

## 2013-01-19 LAB — POCT I-STAT, CHEM 8
HCT: 36 % — ABNORMAL LOW (ref 39.0–52.0)
Hemoglobin: 12.2 g/dL — ABNORMAL LOW (ref 13.0–17.0)
Potassium: 4.8 mEq/L (ref 3.5–5.1)
Sodium: 137 mEq/L (ref 135–145)
TCO2: 28 mmol/L (ref 0–100)

## 2013-01-19 LAB — PROTIME-INR
INR: 1.41 (ref 0.00–1.49)
Prothrombin Time: 14.7 seconds (ref 11.6–15.2)
Prothrombin Time: 16.9 seconds — ABNORMAL HIGH (ref 11.6–15.2)

## 2013-01-19 LAB — APTT
aPTT: 34 seconds (ref 24–37)
aPTT: 42 seconds — ABNORMAL HIGH (ref 24–37)

## 2013-01-19 LAB — POCT I-STAT 4, (NA,K, GLUC, HGB,HCT)
Glucose, Bld: 143 mg/dL — ABNORMAL HIGH (ref 70–99)
Glucose, Bld: 151 mg/dL — ABNORMAL HIGH (ref 70–99)
Glucose, Bld: 108 mg/dL — ABNORMAL HIGH (ref 70–99)
HCT: 39 % (ref 39.0–52.0)
Hemoglobin: 13.3 g/dL (ref 13.0–17.0)
Hemoglobin: 11.6 g/dL — ABNORMAL LOW (ref 13.0–17.0)
Potassium: 4.4 mEq/L (ref 3.5–5.1)
Potassium: 4 mEq/L (ref 3.5–5.1)
Potassium: 3.5 mEq/L (ref 3.5–5.1)
Sodium: 138 mEq/L (ref 135–145)
Sodium: 139 mEq/L (ref 135–145)
Sodium: 139 mEq/L (ref 135–145)

## 2013-01-19 LAB — BLOOD GAS, ARTERIAL
Acid-Base Excess: 1.9 mmol/L (ref 0.0–2.0)
Drawn by: 181601
FIO2: 21 %
O2 Saturation: 96.8 %
pO2, Arterial: 88.3 mmHg (ref 80.0–100.0)

## 2013-01-19 LAB — POCT I-STAT 3, ART BLOOD GAS (G3+)
Acid-Base Excess: 1 mmol/L (ref 0.0–2.0)
Bicarbonate: 26.8 mEq/L — ABNORMAL HIGH (ref 20.0–24.0)
O2 Saturation: 98 %
O2 Saturation: 97 %
O2 Saturation: 95 %
pCO2 arterial: 39.2 mmHg (ref 35.0–45.0)
pCO2 arterial: 44.9 mmHg (ref 35.0–45.0)
pCO2 arterial: 44.2 mmHg (ref 35.0–45.0)
pH, Arterial: 7.449 (ref 7.350–7.450)
pO2, Arterial: 100 mmHg (ref 80.0–100.0)
pO2, Arterial: 98 mmHg (ref 80.0–100.0)

## 2013-01-19 LAB — CREATININE, SERUM: GFR calc non Af Amer: 70 mL/min — ABNORMAL LOW (ref >90)

## 2013-01-19 SURGERY — REPLACEMENT, AORTIC VALVE, TRANSAPICAL APPROACH
Anesthesia: General | Site: Chest | Wound class: Clean

## 2013-01-19 MED ORDER — LIDOCAINE HCL (CARDIAC) 20 MG/ML IV SOLN
INTRAVENOUS | Status: DC | PRN
  Administered 2013-01-19: 100 mg via INTRAVENOUS

## 2013-01-19 MED ORDER — DEXMEDETOMIDINE HCL IN NACL 200 MCG/50ML IV SOLN: 0.1000 ug/kg/h | INTRAVENOUS | Status: DC

## 2013-01-19 MED ORDER — SODIUM CHLORIDE 0.9 % IV SOLN
250.0000 mL | INTRAVENOUS | Status: DC
  Administered 2013-01-20: 20 mL via INTRAVENOUS

## 2013-01-19 MED ORDER — FAMOTIDINE IN NACL 20-0.9 MG/50ML-% IV SOLN
20.0000 mg | Freq: Two times a day (BID) | INTRAVENOUS | Status: AC
  Administered 2013-01-19: 20 mg via INTRAVENOUS

## 2013-01-19 MED ORDER — LACTATED RINGERS IV SOLN: INTRAVENOUS | Status: DC

## 2013-01-19 MED ORDER — SODIUM CHLORIDE 0.9 % IV SOLN
100.0000 [IU] | INTRAVENOUS | Status: DC | PRN
  Administered 2013-01-19: 1 [IU]/h via INTRAVENOUS
  Administered 2013-01-19: 3.7 [IU]/h via INTRAVENOUS

## 2013-01-19 MED ORDER — ARTIFICIAL TEARS OP OINT
TOPICAL_OINTMENT | OPHTHALMIC | Status: DC | PRN
  Administered 2013-01-19: 1 via OPHTHALMIC

## 2013-01-19 MED ORDER — METOPROLOL TARTRATE 12.5 MG HALF TABLET
12.5000 mg | ORAL_TABLET | Freq: Two times a day (BID) | ORAL | Status: DC
  Filled 2013-01-19 (×3): qty 1

## 2013-01-19 MED ORDER — METOPROLOL TARTRATE 25 MG/10 ML ORAL SUSPENSION
12.5000 mg | Freq: Two times a day (BID) | ORAL | Status: DC
  Filled 2013-01-19 (×3): qty 5

## 2013-01-19 MED ORDER — MAGNESIUM SULFATE 40 MG/ML IJ SOLN
4.0000 g | Freq: Once | INTRAMUSCULAR | Status: AC
  Administered 2013-01-19: 4 g via INTRAVENOUS
  Filled 2013-01-19: qty 100

## 2013-01-19 MED ORDER — BISACODYL 10 MG RE SUPP: 10.0000 mg | Freq: Every day | RECTAL | Status: DC

## 2013-01-19 MED ORDER — MIDAZOLAM HCL 5 MG/5ML IJ SOLN
INTRAMUSCULAR | Status: DC | PRN
  Administered 2013-01-19 (×2): 2 mg via INTRAVENOUS

## 2013-01-19 MED ORDER — 0.9 % SODIUM CHLORIDE (POUR BTL) OPTIME
TOPICAL | Status: DC | PRN
  Administered 2013-01-19: 5000 mL
  Administered 2013-01-19: 2000 mL

## 2013-01-19 MED ORDER — IODIXANOL 320 MG/ML IV SOLN
INTRAVENOUS | Status: DC | PRN
  Administered 2013-01-19: 60.7 mL via INTRA_ARTERIAL

## 2013-01-19 MED ORDER — ONDANSETRON HCL 4 MG/2ML IJ SOLN: 4.0000 mg | Freq: Four times a day (QID) | INTRAMUSCULAR | Status: DC | PRN

## 2013-01-19 MED ORDER — ALBUMIN HUMAN 5 % IV SOLN
INTRAVENOUS | Status: DC | PRN
  Administered 2013-01-19: 08:00:00 via INTRAVENOUS

## 2013-01-19 MED ORDER — ACETAMINOPHEN 10 MG/ML IV SOLN
1000.0000 mg | Freq: Once | INTRAVENOUS | Status: AC
  Administered 2013-01-19: 1000 mg via INTRAVENOUS
  Filled 2013-01-19: qty 100

## 2013-01-19 MED ORDER — ACETAMINOPHEN 160 MG/5ML PO SOLN: 975.0000 mg | Freq: Four times a day (QID) | ORAL | Status: DC

## 2013-01-19 MED ORDER — DEXMEDETOMIDINE HCL IN NACL 200 MCG/50ML IV SOLN
INTRAVENOUS | Status: DC | PRN
  Administered 2013-01-19: 0.2 ug/kg/h via INTRAVENOUS

## 2013-01-19 MED ORDER — LACTATED RINGERS IV SOLN: 500.0000 mL | Freq: Once | INTRAVENOUS | Status: AC | PRN

## 2013-01-19 MED ORDER — ROCURONIUM BROMIDE 100 MG/10ML IV SOLN
INTRAVENOUS | Status: DC | PRN
  Administered 2013-01-19 (×2): 50 mg via INTRAVENOUS

## 2013-01-19 MED ORDER — ARTIFICIAL TEARS OP OINT: TOPICAL_OINTMENT | OPHTHALMIC | Status: DC | PRN

## 2013-01-19 MED ORDER — SODIUM CHLORIDE 0.45 % IV SOLN
INTRAVENOUS | Status: DC
  Administered 2013-01-19: 12:00:00 via INTRAVENOUS

## 2013-01-19 MED ORDER — VANCOMYCIN HCL IN DEXTROSE 1-5 GM/200ML-% IV SOLN
1000.0000 mg | Freq: Once | INTRAVENOUS | Status: AC
  Administered 2013-01-19: 1000 mg via INTRAVENOUS
  Filled 2013-01-19: qty 200

## 2013-01-19 MED ORDER — MORPHINE SULFATE 2 MG/ML IJ SOLN
2.0000 mg | INTRAMUSCULAR | Status: DC | PRN
  Administered 2013-01-19 – 2013-01-20 (×4): 2 mg via INTRAVENOUS
  Administered 2013-01-20: 4 mg via INTRAVENOUS
  Administered 2013-01-20: 2 mg via INTRAVENOUS
  Filled 2013-01-19: qty 39
  Filled 2013-01-19: qty 1
  Filled 2013-01-19: qty 2
  Filled 2013-01-19 (×5): qty 1

## 2013-01-19 MED ORDER — DOXAZOSIN MESYLATE 8 MG PO TABS
8.0000 mg | ORAL_TABLET | Freq: Every day | ORAL | Status: DC
  Administered 2013-01-19 – 2013-01-21 (×3): 8 mg via ORAL
  Filled 2013-01-19 (×5): qty 1

## 2013-01-19 MED ORDER — PANTOPRAZOLE SODIUM 40 MG PO TBEC
40.0000 mg | DELAYED_RELEASE_TABLET | Freq: Every day | ORAL | Status: DC
  Administered 2013-01-21 – 2013-01-22 (×2): 40 mg via ORAL
  Filled 2013-01-19 (×2): qty 1

## 2013-01-19 MED ORDER — METOPROLOL TARTRATE 1 MG/ML IV SOLN
2.5000 mg | INTRAVENOUS | Status: DC | PRN
  Administered 2013-01-20: 5 mg via INTRAVENOUS

## 2013-01-19 MED ORDER — NITROGLYCERIN IN D5W 200-5 MCG/ML-% IV SOLN
0.0000 ug/min | INTRAVENOUS | Status: DC
  Administered 2013-01-19: 40 ug/min via INTRAVENOUS

## 2013-01-19 MED ORDER — SODIUM CHLORIDE 0.9 % IR SOLN
Status: DC | PRN
  Administered 2013-01-19: 09:00:00

## 2013-01-19 MED ORDER — SODIUM CHLORIDE 0.9 % IJ SOLN: 3.0000 mL | INTRAMUSCULAR | Status: DC | PRN

## 2013-01-19 MED ORDER — TRAMADOL HCL 50 MG PO TABS
50.0000 mg | ORAL_TABLET | ORAL | Status: DC | PRN
  Administered 2013-01-19 – 2013-01-21 (×5): 50 mg via ORAL
  Filled 2013-01-19 (×5): qty 1

## 2013-01-19 MED ORDER — DEXTROSE 5 % IV SOLN
1.5000 g | Freq: Two times a day (BID) | INTRAVENOUS | Status: AC
  Administered 2013-01-19 – 2013-01-21 (×4): 1.5 g via INTRAVENOUS
  Filled 2013-01-19 (×4): qty 1.5

## 2013-01-19 MED ORDER — DIGOXIN 250 MCG PO TABS
0.2500 mg | ORAL_TABLET | Freq: Every day | ORAL | Status: DC
  Administered 2013-01-20 – 2013-01-22 (×3): 0.25 mg via ORAL
  Filled 2013-01-19 (×4): qty 1

## 2013-01-19 MED ORDER — SODIUM CHLORIDE 0.9 % IJ SOLN
3.0000 mL | Freq: Two times a day (BID) | INTRAMUSCULAR | Status: DC
  Administered 2013-01-20 – 2013-01-22 (×5): 3 mL via INTRAVENOUS

## 2013-01-19 MED ORDER — SODIUM CHLORIDE 0.9 % IV SOLN
INTRAVENOUS | Status: AC
  Administered 2013-01-19: 12:00:00 via INTRAVENOUS

## 2013-01-19 MED ORDER — SIMVASTATIN 40 MG PO TABS
40.0000 mg | ORAL_TABLET | Freq: Every day | ORAL | Status: DC
  Administered 2013-01-19 – 2013-01-21 (×3): 40 mg via ORAL
  Filled 2013-01-19 (×4): qty 1

## 2013-01-19 MED ORDER — PHENYLEPHRINE HCL 10 MG/ML IJ SOLN
0.0000 ug/min | INTRAVENOUS | Status: DC
  Administered 2013-01-20: 5 ug/min via INTRAVENOUS
  Administered 2013-01-21: 60 ug/min via INTRAVENOUS
  Filled 2013-01-19 (×2): qty 2

## 2013-01-19 MED ORDER — ASPIRIN EC 325 MG PO TBEC
325.0000 mg | DELAYED_RELEASE_TABLET | Freq: Every day | ORAL | Status: DC
  Filled 2013-01-19: qty 1

## 2013-01-19 MED ORDER — SODIUM CHLORIDE 0.9 % IV SOLN
INTRAVENOUS | Status: DC
  Filled 2013-01-19: qty 1

## 2013-01-19 MED ORDER — LACTATED RINGERS IV SOLN
INTRAVENOUS | Status: DC | PRN
  Administered 2013-01-19: 07:00:00 via INTRAVENOUS

## 2013-01-19 MED ORDER — MORPHINE SULFATE 2 MG/ML IJ SOLN
1.0000 mg | INTRAMUSCULAR | Status: AC | PRN
  Administered 2013-01-19: 2 mg via INTRAVENOUS

## 2013-01-19 MED ORDER — ACETAMINOPHEN 500 MG PO TABS
1000.0000 mg | ORAL_TABLET | Freq: Four times a day (QID) | ORAL | Status: DC
  Administered 2013-01-19 – 2013-01-22 (×9): 1000 mg via ORAL
  Administered 2013-01-22: 500 mg via ORAL
  Filled 2013-01-19 (×2): qty 2
  Filled 2013-01-19: qty 1
  Filled 2013-01-19 (×12): qty 2

## 2013-01-19 MED ORDER — MIDAZOLAM HCL 2 MG/2ML IJ SOLN: 2.0000 mg | INTRAMUSCULAR | Status: DC | PRN

## 2013-01-19 MED ORDER — BISACODYL 5 MG PO TBEC
10.0000 mg | DELAYED_RELEASE_TABLET | Freq: Every day | ORAL | Status: DC
  Administered 2013-01-20 – 2013-01-21 (×2): 10 mg via ORAL
  Filled 2013-01-19 (×2): qty 2

## 2013-01-19 MED ORDER — PROTAMINE SULFATE 10 MG/ML IV SOLN
INTRAVENOUS | Status: DC | PRN
  Administered 2013-01-19: 80 mg via INTRAVENOUS

## 2013-01-19 MED ORDER — ALBUMIN HUMAN 5 % IV SOLN
250.0000 mL | INTRAVENOUS | Status: AC | PRN
  Administered 2013-01-19 – 2013-01-20 (×4): 250 mL via INTRAVENOUS
  Filled 2013-01-19 (×2): qty 250

## 2013-01-19 MED ORDER — ASPIRIN 81 MG PO CHEW: 324.0000 mg | CHEWABLE_TABLET | Freq: Every day | ORAL | Status: DC

## 2013-01-19 MED ORDER — FENTANYL CITRATE 0.05 MG/ML IJ SOLN
INTRAMUSCULAR | Status: DC | PRN
  Administered 2013-01-19: 700 ug via INTRAVENOUS
  Administered 2013-01-19 (×2): 100 ug via INTRAVENOUS
  Administered 2013-01-19 (×2): 50 ug via INTRAVENOUS

## 2013-01-19 MED ORDER — EPHEDRINE SULFATE 50 MG/ML IJ SOLN
INTRAMUSCULAR | Status: DC | PRN
  Administered 2013-01-19 (×2): 5 mg via INTRAVENOUS

## 2013-01-19 MED ORDER — DOCUSATE SODIUM 100 MG PO CAPS
200.0000 mg | ORAL_CAPSULE | Freq: Every day | ORAL | Status: DC
  Administered 2013-01-20 – 2013-01-22 (×3): 200 mg via ORAL
  Filled 2013-01-19 (×3): qty 2

## 2013-01-19 MED ORDER — INSULIN REGULAR BOLUS VIA INFUSION
0.0000 [IU] | Freq: Three times a day (TID) | INTRAVENOUS | Status: DC
  Filled 2013-01-19: qty 10

## 2013-01-19 MED ORDER — PROPOFOL 10 MG/ML IV BOLUS
INTRAVENOUS | Status: DC | PRN
  Administered 2013-01-19: 30 mg via INTRAVENOUS

## 2013-01-19 MED ORDER — POTASSIUM CHLORIDE 10 MEQ/50ML IV SOLN
10.0000 meq | INTRAVENOUS | Status: AC
  Administered 2013-01-19 (×3): 10 meq via INTRAVENOUS

## 2013-01-19 MED ORDER — HEPARIN SODIUM (PORCINE) 1000 UNIT/ML IJ SOLN
INTRAMUSCULAR | Status: DC | PRN
  Administered 2013-01-19: 8000 [IU] via INTRAVENOUS

## 2013-01-19 SURGICAL SUPPLY — 134 items
ADAPTER CARDIOPLEGIA (MISCELLANEOUS) IMPLANT
ADH SKN CLS APL DERMABOND .7 (GAUZE/BANDAGES/DRESSINGS) ×2
ANTEGRADE CPLG (MISCELLANEOUS) IMPLANT
APL SKNCLS STERI-STRIP NONHPOA (GAUZE/BANDAGES/DRESSINGS) ×4
ATTRACTOMAT 16X20 MAGNETIC DRP (DRAPES) IMPLANT
BAG BANDED W/RUBBER/TAPE 36X54 (MISCELLANEOUS) ×3 IMPLANT
BAG DECANTER FOR FLEXI CONT (MISCELLANEOUS) IMPLANT
BAG EQP BAND 135X91 W/RBR TAPE (MISCELLANEOUS) ×2
BAG SNAP BAND KOVER 36X36 (MISCELLANEOUS) ×6 IMPLANT
BENZOIN TINCTURE PRP APPL 2/3 (GAUZE/BANDAGES/DRESSINGS) ×4 IMPLANT
BLADE OSCILLATING /SAGITTAL (BLADE) ×3 IMPLANT
BLADE STERNUM SYSTEM 6 (BLADE) ×4 IMPLANT
BLADE SURG ROTATE 9660 (MISCELLANEOUS) ×3 IMPLANT
BOSTON SCIENTIFIC 6FR EXPO FR4 ×1 IMPLANT
BOSTON SCIENTIFIC EXPO 6FR PIG 145 ×1 IMPLANT
CABLE PACING FASLOC BIEGE (MISCELLANEOUS) IMPLANT
CABLE PACING FASLOC BLUE (MISCELLANEOUS) ×1 IMPLANT
CANISTER SUCTION 2500CC (MISCELLANEOUS) ×6 IMPLANT
CANNULA FEM VENOUS REMOTE 22FR (CANNULA) IMPLANT
CANNULA FEMORAL ART 14 SM (MISCELLANEOUS) ×1 IMPLANT
CANNULA GUNDRY RCSP 15FR (MISCELLANEOUS) IMPLANT
CANNULA OPTISITE PERFUSION 16F (CANNULA) IMPLANT
CANNULA OPTISITE PERFUSION 18F (CANNULA) IMPLANT
CANNULA SOFTFLOW AORTIC 7M21FR (CANNULA) IMPLANT
CANNULA VENOUS LOW PROF 34X46 (CANNULA) IMPLANT
CATH HEART VENT LEFT (CATHETERS) IMPLANT
CATH S G BIP PACING (SET/KITS/TRAYS/PACK) ×3 IMPLANT
CLIP TI MEDIUM 24 (CLIP) ×1 IMPLANT
CLIP TI WIDE RED SMALL 24 (CLIP) ×1 IMPLANT
CLOTH BEACON ORANGE TIMEOUT ST (SAFETY) ×3 IMPLANT
CONN ST 1/4X3/8  BEN (MISCELLANEOUS) ×1
CONN ST 1/4X3/8 BEN (MISCELLANEOUS) IMPLANT
CONNECTOR 1/2X3/8X1/2 3 WAY (MISCELLANEOUS)
CONNECTOR 1/2X3/8X1/2 3WAY (MISCELLANEOUS) IMPLANT
COVER BACK TABLE 24X17X13 BIG (DRAPES) ×3 IMPLANT
COVER DOME SNAP 22 D (MISCELLANEOUS) ×2 IMPLANT
COVER MAYO STAND STRL (DRAPES) ×3 IMPLANT
COVER PROBE W GEL 5X96 (DRAPES) IMPLANT
COVER SURGICAL LIGHT HANDLE (MISCELLANEOUS) ×3 IMPLANT
COVER TABLE BACK 60X90 (DRAPES) ×3 IMPLANT
CRADLE DONUT ADULT HEAD (MISCELLANEOUS) ×3 IMPLANT
DERMABOND ADVANCED (GAUZE/BANDAGES/DRESSINGS) ×1
DERMABOND ADVANCED .7 DNX12 (GAUZE/BANDAGES/DRESSINGS) ×1 IMPLANT
DRAIN CHANNEL 28F RND 3/8 FF (WOUND CARE) ×1 IMPLANT
DRAIN CHANNEL 32F RND 10.7 FF (WOUND CARE) IMPLANT
DRAPE INCISE IOBAN 66X45 STRL (DRAPES) ×3 IMPLANT
DRAPE SLUSH MACHINE 52X66 (DRAPES) ×1 IMPLANT
DRAPE SLUSH/WARMER DISC (DRAPES) ×2 IMPLANT
DRAPE SURG IRRIG POUCH 19X23 (DRAPES) ×4 IMPLANT
DRAPE TABLE COVER HEAVY DUTY (DRAPES) ×3 IMPLANT
EDWARDS SWNA-GANZ BIPOLAR PACING CATHETER ×1 IMPLANT
ELECT BLADE 6.5 EXT (BLADE) ×3 IMPLANT
ELECT REM PT RETURN 9FT ADLT (ELECTROSURGICAL) ×6
ELECTRODE REM PT RTRN 9FT ADLT (ELECTROSURGICAL) ×4 IMPLANT
FELT TEFLON 6X6 (MISCELLANEOUS) ×3 IMPLANT
FEMORAL VENOUS CANN RAP (CANNULA) IMPLANT
GAUZE SPONGE 2X2 8PLY STRL LF (GAUZE/BANDAGES/DRESSINGS) IMPLANT
GLOVE BIOGEL M 6.5 STRL (GLOVE) IMPLANT
GLOVE BIOGEL M STER SZ 6 (GLOVE) IMPLANT
GLOVE BIOGEL PI IND STRL 6.5 (GLOVE) IMPLANT
GLOVE BIOGEL PI INDICATOR 6.5 (GLOVE) ×2
GLOVE ECLIPSE 6.5 STRL STRAW (GLOVE) ×2 IMPLANT
GLOVE ECLIPSE 7.5 STRL STRAW (GLOVE) ×3 IMPLANT
GLOVE ECLIPSE 8.0 STRL XLNG CF (GLOVE) ×6 IMPLANT
GLOVE EUDERMIC 7 POWDERFREE (GLOVE) ×6 IMPLANT
GLOVE ORTHO TXT STRL SZ7.5 (GLOVE) ×13 IMPLANT
GOWN PREVENTION PLUS XLARGE (GOWN DISPOSABLE) ×21 IMPLANT
GOWN STRL NON-REIN LRG LVL3 (GOWN DISPOSABLE) ×9 IMPLANT
GUIDEWIRE SAF TJ AMPL .035X180 (WIRE) ×1 IMPLANT
HEMOSTAT POWDER SURGIFOAM 1G (HEMOSTASIS) IMPLANT
INSERT FOGARTY 61MM (MISCELLANEOUS) ×3 IMPLANT
INSERT FOGARTY XLG (MISCELLANEOUS) ×1 IMPLANT
KIT BASIN OR (CUSTOM PROCEDURE TRAY) ×3 IMPLANT
KIT DILATOR VASC 18G NDL (KITS) IMPLANT
KIT ROOM TURNOVER OR (KITS) ×3 IMPLANT
KIT SUCTION CATH 14FR (SUCTIONS) ×9 IMPLANT
LEAD PACING MYOCARDI (MISCELLANEOUS) ×1 IMPLANT
NAMIC CONVENIENCE KIT LEFT HEART KIT ×1 IMPLANT
NDL 18GX1X1/2 (RX/OR ONLY) (NEEDLE) ×1 IMPLANT
NDL PERC 18GX7CM (NEEDLE) ×2 IMPLANT
NEEDLE 18GX1X1/2 (RX/OR ONLY) (NEEDLE) ×3 IMPLANT
NEEDLE PERC 18GX7CM (NEEDLE) ×9 IMPLANT
NS IRRIG 1000ML POUR BTL (IV SOLUTION) ×9 IMPLANT
PACK AORTA (CUSTOM PROCEDURE TRAY) ×3 IMPLANT
PAD ARMBOARD 7.5X6 YLW CONV (MISCELLANEOUS) ×6 IMPLANT
PAD ELECT DEFIB RADIOL ZOLL (MISCELLANEOUS) ×3 IMPLANT
PATCH TACHOSII LRG 9.5X4.8 (VASCULAR PRODUCTS) IMPLANT
RETRACTOR PVM SOFT TISSUE M (INSTRUMENTS) ×2 IMPLANT
RETRACTOR TRL SOFT TISSUE LG (INSTRUMENTS) IMPLANT
RETRACTOR TRM SOFT TISSUE 7.5 (INSTRUMENTS) IMPLANT
SET CANNULATION TOURNIQUET (MISCELLANEOUS) IMPLANT
SPONGE GAUZE 2X2 STER 10/PKG (GAUZE/BANDAGES/DRESSINGS) ×1
SPONGE GAUZE 4X4 12PLY (GAUZE/BANDAGES/DRESSINGS) ×3 IMPLANT
SPONGE LAP 4X18 X RAY DECT (DISPOSABLE) ×6 IMPLANT
STOPCOCK MORSE 400PSI 3WAY (MISCELLANEOUS) ×3 IMPLANT
SUCKER INTRACARDIAC WEIGHTED (SUCKER) ×1 IMPLANT
SUT BONE WAX W31G (SUTURE) ×3 IMPLANT
SUT ETHIBOND 2 0 SH (SUTURE) ×3 IMPLANT
SUT ETHIBOND 2 0 SH 36X2 (SUTURE) IMPLANT
SUT ETHIBOND X763 2 0 SH 1 (SUTURE) ×8 IMPLANT
SUT MNCRL AB 3-0 PS2 18 (SUTURE) ×8 IMPLANT
SUT PROLENE 2 0 MH 48 (SUTURE) ×9 IMPLANT
SUT PROLENE 3 0 SH1 36 (SUTURE) ×3 IMPLANT
SUT PROLENE 4 0 RB 1 (SUTURE) ×6
SUT PROLENE 4-0 RB1 .5 CRCL 36 (SUTURE) ×4 IMPLANT
SUT SILK  1 MH (SUTURE) ×1
SUT SILK 1 MH (SUTURE) ×2 IMPLANT
SUT SILK 1 TIES 10X30 (SUTURE) ×3 IMPLANT
SUT SILK 2 0 SH CR/8 (SUTURE) ×3 IMPLANT
SUT SILK 2 0SH CR/8 30 (SUTURE) ×3 IMPLANT
SUT TEM PAC WIRE 2 0 SH (SUTURE) IMPLANT
SUT VIC AB 1 CTX 36 (SUTURE) ×3
SUT VIC AB 1 CTX36XBRD ANBCTR (SUTURE) ×2 IMPLANT
SUT VIC AB 2-0 CT1 36 (SUTURE) ×3 IMPLANT
SUT VIC AB 2-0 CTX 36 (SUTURE) ×6 IMPLANT
SUT VIC AB 3-0 SH 8-18 (SUTURE) ×6 IMPLANT
SUT VICRYL 2 TP 1 (SUTURE) IMPLANT
SYR 30ML LL (SYRINGE) ×6 IMPLANT
SYR 3ML LL SCALE MARK (SYRINGE) ×1 IMPLANT
SYR 50ML LL SCALE MARK (SYRINGE) ×3 IMPLANT
SYR MEDRAD MARK V 150ML (SYRINGE) ×1 IMPLANT
SYRINGE 10CC LL (SYRINGE) ×4 IMPLANT
SYSTEM SAHARA CHEST DRAIN ATS (WOUND CARE) ×3 IMPLANT
TAPE CLOTH SURG 4X10 WHT LF (GAUZE/BANDAGES/DRESSINGS) ×1 IMPLANT
TOWEL OR 17X24 6PK STRL BLUE (TOWEL DISPOSABLE) ×9 IMPLANT
TOWEL OR 17X26 10 PK STRL BLUE (TOWEL DISPOSABLE) ×8 IMPLANT
TRANSPAC IV DISPOSIBLE TRANSDUCER WITH STOPCOCOKS ×2 IMPLANT
TRAY FOLEY IC TEMP SENS 14FR (CATHETERS) ×3 IMPLANT
TUBE SUCT INTRACARD DLP 20F (MISCELLANEOUS) ×1 IMPLANT
TUBING HIGH PRESSURE 120CM (CONNECTOR) ×6 IMPLANT
UNDERPAD 30X30 INCONTINENT (UNDERPADS AND DIAPERS) ×3 IMPLANT
VALVE TRANSAPICAL HRT 26MM KT (Valve) ×1 IMPLANT
VENT LEFT HEART 12002 (CATHETERS)
WATER STERILE IRR 1000ML POUR (IV SOLUTION) ×6 IMPLANT

## 2013-01-19 NOTE — OR Nursing (Signed)
SICU call at 45 minutes [phone]

## 2013-01-19 NOTE — Anesthesia Preprocedure Evaluation (Addendum)
Anesthesia Evaluation  Patient identified by MRN, date of birth, ID band Patient awake    Reviewed: Allergy & Precautions, H&P , NPO status , Patient's Chart, lab work & pertinent test results, reviewed documented beta blocker date and time   History of Anesthesia Complications Negative for: history of anesthetic complications  Airway Mallampati: II TM Distance: >3 FB Neck ROM: Full    Dental  (+) Edentulous Upper and Edentulous Lower   Pulmonary shortness of breath, Recent URI , Resolved, former smoker,  breath sounds clear to auscultation  Pulmonary exam normal       Cardiovascular hypertension, Pt. on home beta blockers and Pt. on medications + Peripheral Vascular Disease (calcified aorta) + dysrhythmias (INR 1.17) Atrial Fibrillation Rhythm:Irregular Rate:Abnormal + Systolic murmurs    Neuro/Psych negative neurological ROS     GI/Hepatic   Endo/Other  diabetes (glu 183), Type 2, Oral Hypoglycemic Agents  Renal/GU    Prostate cancer    Musculoskeletal   Abdominal Normal abdominal exam  (+)   Peds  Hematology   Anesthesia Other Findings EF 55 %, Severe AS, valve area 0.84  Reproductive/Obstetrics                         Anesthesia Physical Anesthesia Plan  ASA: III  Anesthesia Plan: General   Post-op Pain Management:    Induction: Intravenous  Airway Management Planned: Oral ETT  Additional Equipment: Arterial line, CVP, PA Cath, 3D TEE and Ultrasound Guidance Line Placement  Intra-op Plan:   Post-operative Plan: Post-operative intubation/ventilation  Informed Consent: I have reviewed the patients History and Physical, chart, labs and discussed the procedure including the risks, benefits and alternatives for the proposed anesthesia with the patient or authorized representative who has indicated his/her understanding and acceptance.     Plan Discussed with: CRNA,  Anesthesiologist and Surgeon  Anesthesia Plan Comments: (Plan routine monitors, A line, PA cath, GETA with TEE and post op ventilation )       Anesthesia Quick Evaluation

## 2013-01-19 NOTE — Progress Notes (Signed)
Utilization Review Completed.[name] T3/18/2014

## 2013-01-19 NOTE — Op Note (Signed)
INTERVENTIONAL CARDIOLOGY TAVR NOTE  Date of Procedure: 01/19/2013   Preoperative Diagnosis: Severe Aortic Stenosis   Postoperative Diagnosis: Same   Procedure:  Transcatheter Aortic Valve Replacement - Transapical Approach Edwards Sapien THV (size 26 mm, model # 9000TFX, serial # [id number])   Co-Surgeons: Salvatore Decent. Cornelius Moras, MD and Tonny Bollman, MD   Assistants: Alleen Borne, MD and Verne Carrow, MD   Anesthesiologist: Jairo Ben, MD   Echocardiographer: Marca Ancona, MD   Procedural Indication: Severe symptomatic aortic stenosis  Peripheral Access:  Bilateral groin sites were prepped and draped under normal sterile conditions. Using the modified Seldinger technique, 6 Fr sheaths were placed in the right and left femoral arteries and veins. A 6 Fr pigtail catheter was advanced over a j wire through the right femoral arterial sheath and a balloon-tipped temporary pacing wire was advanced into the RV apex under fluoroscopic guidance. Pacing threshold was less than 1 mA.   For the remaining procedural details, please see the detailed note of Dr Cornelius Moras.   A 26 mm Edwards Sapien THV valve was placed successfully. There were no immediate procedural complications.  Tonny Bollman 01/19/2013 4:19 PM

## 2013-01-19 NOTE — Transfer of Care (Signed)
Immediate Anesthesia Transfer of Care Note  Patient: [name]  Procedure(s) Performed: Procedure(s): TRANSCATHETER AORTIC VALVE REPLACEMENT, TRANSAPICAL (N/A) INTRAOPERATIVE TRANSESOPHAGEAL ECHOCARDIOGRAM (N/A)  Patient Location: ICU  Anesthesia Type:General  Level of Consciousness: Patient remains intubated per anesthesia plan  Airway & Oxygen Therapy: Patient placed on Ventilator (see vital sign flow sheet for setting)  Post-op Assessment: Report given to PACU RN  Post vital signs: Reviewed and stable  Complications: No apparent anesthesia complications

## 2013-01-19 NOTE — Preoperative (Signed)
Beta Blockers   Reason not to administer Beta Blockers:Not Applicable 

## 2013-01-19 NOTE — Anesthesia Procedure Notes (Signed)
Procedures PA catheter:  Routine monitors. Timeout, sterile prep, drape, FBP R neck.  Trendelenburg position.  1% Lido local, finder and trocar RIJ 1st pass with US guidance.  Cordis placed over J wire. PA catheter in easily.  Sterile dressing applied.  Patient tolerated well, VSS.  Sandford Craze, MD   [phone]

## 2013-01-19 NOTE — Anesthesia Postprocedure Evaluation (Signed)
  Anesthesia Post-op Note  Patient: [name]  Procedure(s) Performed: Procedure(s): TRANSCATHETER AORTIC VALVE REPLACEMENT, TRANSAPICAL (N/A) INTRAOPERATIVE TRANSESOPHAGEAL ECHOCARDIOGRAM (N/A)  Patient Location: SICU  Anesthesia Type:General  Level of Consciousness: awake, alert , oriented and patient cooperative  Airway and Oxygen Therapy: Patient Spontanous Breathing and Patient connected to nasal cannula oxygen  Post-op Pain: moderate  Post-op Assessment: Post-op Vital signs reviewed, Patient's Cardiovascular Status Stable, Respiratory Function Stable, Patent Airway, No signs of Nausea or vomiting and Pain level controlled  Post-op Vital Signs: Reviewed and stable  Complications: No apparent anesthesia complications

## 2013-01-19 NOTE — Progress Notes (Signed)
Reported off to oncoming shift nurse, Keigo, RN.  No acute distress noted.  Safety maintained.  

## 2013-01-19 NOTE — Interval H&P Note (Signed)
History and Physical Interval Note:  01/19/2013 6:49 AM  Kai Levins  has presented today for surgery, with the diagnosis of Severe Aortic Stenosis  The various methods of treatment have been discussed with the patient and family. After consideration of risks, benefits and other options for treatment, the patient has consented to  Procedure(s): TRANSCATHETER AORTIC VALVE REPLACEMENT, TRANSAPICAL (N/A) INTRAOPERATIVE TRANSESOPHAGEAL ECHOCARDIOGRAM (N/A) as a surgical intervention .  The patient's history has been reviewed, patient examined, no change in status, stable for surgery.  I have reviewed the patient's chart and labs.  Questions were answered to the patient's satisfaction.     OWEN,CLARENCE H

## 2013-01-19 NOTE — OR Nursing (Signed)
Heparin 2000 units in 1L 0.9%NaCl used intraoperatively throughout procedure expiration 05/04/2014

## 2013-01-19 NOTE — Progress Notes (Signed)
  Echocardiogram Echocardiogram Transesophageal has been performed.  [name] 01/19/2013, 10:46 AM

## 2013-01-19 NOTE — Procedures (Signed)
Extubation Procedure Note  Patient Details:   Name: [name] DOB: Apr 10, 1933 MRN: [id number]   Airway Documentation:     Evaluation  O2 sats: stable throughout Complications: No apparent complications Patient did tolerate procedure well. Bilateral Breath Sounds: Clear   Yes pt able to vocalize.   Pt extubated at this time per SICU rapid wean protocol. NIF -20 cm H20, VC 0.700 Liters, IS 500-750 mLx10. No complications noted. Pt able to breathe around deflated cuff. No stridor noted. RT will continue to monitor.   [name] [facility] 01/19/2013, 3:30 PM

## 2013-01-19 NOTE — Op Note (Addendum)
CARDIOTHORACIC SURGERY OPERATIVE NOTE  Date of Procedure:  01/19/2013  Preoperative Diagnosis: Severe Aortic Stenosis   Postoperative Diagnosis: Same   Procedure:    Transcatheter Aortic Valve Replacement - Transapical Approach  Edwards Sapien THV (size 26 mm, model # 9000TFX, serial # [id number])   Co-Surgeons:  Salvatore Decent. Cornelius Moras, MD and Tonny Bollman, MD  Assistants:   Alleen Borne, MD and Verne Carrow, MD  Anesthesiologist:  Jairo Ben, MD  Echocardiographer:  Marca Ancona, MD  Operative Findings:  Severe aortic stenosis  Normal left ventricular systolic function  Moderate-severe mitral regurgitation (type I dysfunction)  Moderate aortic insufficiency  Postoperative moderate (1+/2+) paravalvular leak   Postoperative mild (1+) residual mitral regurgitation     BRIEF CLINICAL NOTE AND INDICATIONS FOR SURGERY  Patient is a 79 year old white male with history of aortic stenosis, chronic persistent atrial fibrillation, type II diabetes, and remote history of tobacco use who Has been followed for years by Dr. Ladona Ridgel for management of atrial fibrillation. He has developed aortic stenosis which has progressed in severity on followup echocardiography and which has been associated with development of symptoms of exertional shortness of breath and fatigue. Most recent echocardiogram demonstrates the presence of critical aortic stenosis with mean transvalvular gradient estimated 66 mmHg and peak velocity greater than 5 m/s. Cardiac catheterization confirmed the presence of severe aortic stenosis with mean gradient measured 51 mmHg and was also notable for the absence of significant coronary artery disease. There was moderate pulmonary hypertension and low normal left ventricular systolic function. The patient was referred to Dr. Tyrone Sage to consider conventional aortic valve replacement but the patient was felt to be extremely poor candidate for surgery do to the presence  of severe calcification of the entire ascending thoracic aorta. 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  During the course of his work up he has been evaluated comprehensively by a multidisciplinary team of specialists coordinated through the Multid[facility]ve [facility] in the [facility]. The patient has been counseled extensively as to the relative risks and benefits of all options for the treatment of severe aortic stenosis including long term medical therapy, conventional surgery for aortic valve replacement, and transcatheter aortic valve replacement. The patient has been independently evaluated by two cardiac surgeons including Dr. Tyrone Sage and myself, and both of [place] feel that the patient would be a poor candidate for conventional surgery (predicted risk of mortality >15% and/or predicted risk of permanent morbidity >50%) because of comorbidities including most notably his severely calcified, porcelain aorta. Based upon review of all of the patient's preoperative diagnostic tests he is felt to be candidate for transcatheter aortic valve replacement using the transapical approach as an alternative to high risk conventional surgery. The patient has been counseled at length regarding the indications, risks and potential benefits of surgery. All questions have been answered, and the patient now presents for surgery having provided full informed consent for the operation as planned.  Following the decision to proceed with transcatheter aortic valve replacement, a discussion has been held regarding what types of management strategies would be attempted intraoperatively in the event of life-threatening complications, including whether or not the patient would be considered a candidate for the use of cardiopulmonary bypass and/or conversion to open sternotomy for attempted surgical intervention.  The patient has been advised  of a variety of complications that might develop peculiar to this approach including but not limited to risks  of death, stroke, paravalvular leak, aortic dissection or other major vascular complications, aortic annulus rupture, device embolization, cardiac rupture or perforation, acute myocardial infarction, arrhythmia, heart block or bradycardia requiring permanent pacemaker placement, congestive heart failure, respiratory failure, renal failure, pneumonia, infection, other late complications related to structural valve deterioration or migration, or other complications that might ultimately cause a temporary or permanent loss of functional independence or other long term morbidity.  The patient provides full informed consent for the procedure as described and all questions were answered preoperatively.    DETAILS OF THE OPERATIVE PROCEDURE  PREPARATION:    The patient is brought to the operating room on the above mentioned date and central monitoring was established by the anesthesia team including placement of [name]-Ganz catheter and radial arterial line. The patient is placed in the supine position on the operating table.  Intravenous antibiotics are administered. General endotracheal anesthesia is induced uneventfully. A Foley catheter is placed.  Baseline transesophageal echocardiogram was performed.  Findings were notable for severe calcific aortic stenosis, moderate aortic insufficiency, moderate-severe mitral regurgitation (type I dysfunction) and normal LV systolic function.  The patient's chest, abdomen, both groins, and both lower extremities are prepared and draped in a sterile manner. A time out procedure is performed.   PERIPHERAL ACCESS:    Bilateral femoral arterial and venous access is obtained with placement of 6 Fr arterial sheaths on both sides.  A pigtail diagnostic catheter is passed through the right arterial sheath under fluoroscopic guidance into the aortic root.  A  temporary transvenous pacemaker catheter is passed through the right femoral venous sheath under fluoroscopic guidance into the right ventricle.  The pacemaker is tested to ensure stable lead placement and pacemaker capture.   TRANSAPICAL ACCESS:   The location of the left ventricular apex is confirmed using flouroscopy, and a miniature left thoracotomy incision is made directly over the left ventricular apex.  The left pleural space is entered.  No adhesions are encountered.  A soft tissue retractor is placed and the ribs gently spread to expose the pericardial surface.  A longitudinal incision is made in the pericardium and the left ventricular apex inspected.  There are no intrapericardial adhesions.  An appropriate site for left ventricular sheath placement is chosen well lateral to the left anterior descending coronary artery and verified using TEE.  Two pursestring sutures are placed using pledgeted 2-0 Prolene suture in a double triangle orientation.   The patient is heparinized systemically and ACT verified > 250 seconds.  The left ventricular apex is punctured using an 18 gauge needle and a soft J-tipped guidewire is passed into the left ventricle and through the aortic valve under fluoroscopic guidance while also continuously monitoring TEE for signs of entrapment in the mitral apparatus.  A 6 Fr sheath is placed over the guidewire and across the aortic valve.  A JR-4 catheter is passed through the sheath and maneuvered around the aortic arch into the descending thoracic aorta under fluoroscopic guidance.  An Amplatz extra stiff guidewire is passed through the JR-4 catheter into the descending thoracic aorta and both the JR-4 catheter and the introducing sheath are removed.  An Edwards Ascendra 3 introducer sheath is passed over the guidewire into the left ventricle.  The sheath position is continuously monitored and secured by the surgical assistant.  Baseline simultaneous left ventricular and  aortic pressures are recorded.   BALLOON AORTIC VALVULOPLASTY:  An Edwards Ascendra BAV catheter (model #9100 BAVC, 20mm x 3 cm balloon) is advanced  over the guidewire and through the introducer sheath into the left ventricle and across the aortic valve.  The position of the BAV catheter is verified flouroscopically with approximately 50% of the balloon length above and below the plane of the aortic annulus.  Balloon aortic valvuloplasty is performed while temporarily holding ventilation and during rapid ventricular pacing to maintain systolic blood pressure < 50 mmHg and pulse pressure < 10 mmHg.  The balloon is removed and TEE reassessed for valve and left ventricular function.    The patient's hemodynamic recovery following BAV is somewhat slow.   TRANSCATHETER HEART VALVE DEPLOYMENT:  An Edwards Sapien THV (size 26 mm, model # 9000TFX, serial [id number]) is prepared and crimped per manufacturer's guidelines, and the proper orientation of the valve is confirmed on the [employer]  3 delivery system.  The delivery system loader is advanced into the introducing sheath.  The valve and delivery system are advanced through the loader into the sheath and all air is evacuated.  The valve and balloon are advanced into the left ventricle and part way through the aortic valve.  The valve pusher is retracted.  The valve is then finely positioned in the aortic valve and its position verified using a hand-injection aortogram  while temporarily holding ventilation and rapid pacing.  Valve position is also confirmed using TEE, and care is taken to make certain there is no sign of entanglement in the mitral apparatus.  Once final position of the valve has been confirmed, the valve is deployed while temporarily holding ventilation and during rapid ventricular pacing to maintain systolic blood pressure < 50 mmHg and pulse pressure < 10 mmHg.  The balloon inflation is held for >3 seconds after reaching full deployment  volume.  Once the balloon has fully deflated the balloon is retracted into the left ventricle and valve function is assessed using TEE.   There is felt to be moderate (2+) paravalvular leak and trivial central aortic insufficiency.  Left ventricular function is unchanged from preoperatively.  There is only mild residual mitral regurgitation.  The patient's hemodynamic recovery following valve deployment is rapid.  Post-deployment dilatation is performed using the deployment balloon with 1 mL additional volume in the balloon during rapid pacing.  Following post-deployment dilatation the degree of paravalvular leak was improved but still mild to moderate (1+ to 2+).  The deployment balloon and guidewire are both removed and simultaneous left ventricular and aortic pressures are recorded.   PROCEDURE COMPLETION:  The left ventricular sheath is removed during rapid ventricular pacing to maintain systolic blood pressure < 70 mmHg while the pursestring sutures are tied.  The apical closure is inspected and notably intact.  Protamine is administered.    Once hemostasis has been ascertained, the left pleural space is drained using a single 28 Fr Bard chest tube and the mini thoracotomy incision is closed in layers and the skin incision closed using a subcuticular skin closure.   The temporary pacemaker, pigtail catheters and all femoral sheaths are removed.  The patient tolerated the procedure well and is transported to the surgical intensive care in stable condition. There are no intraoperative complications. All sponge instrument and needle counts are verified correct at completion of the operation.  No blood products were administered during the operation.  The patient received a total of 60.7 mL of intravenous contrast during the procedure.    Salvatore Decent. Cornelius Moras MD 01/19/2013 10:39 AM

## 2013-01-19 NOTE — Progress Notes (Signed)
Patient ID: [name], male   DOB: [birth year], 79 y.o.   MRN: [id number]                   [address]            [name] [address]          [phone]     Day of Surgery Procedure(s) (LRB): TRANSCATHETER AORTIC VALVE REPLACEMENT, TRANSAPICAL (N/A) INTRAOPERATIVE TRANSESOPHAGEAL ECHOCARDIOGRAM (N/A)  Total Length of Stay:  LOS: 0 days  BP 112/47  Pulse 60  Temp(Src) 97.7 F (36.5 C) (Core (Comment))  Resp 20  Wt 182 lb 1.6 oz (82.6 kg)  BMI 27.28 kg/m2  SpO2 98%  .Intake/Output     03/17 0701 - 03/18 0700 03/18 0701 - 03/19 0700   I.V. (mL/kg)  2704.1 (32.7)   IV Piggyback  1150   Total Intake(mL/kg)  3854.1 (46.7)   Urine (mL/kg/hr)  1105 (1.2)   Blood  100 (0.1)   Chest Tube  250 (0.3)   Total Output   1455   Net   +2399.1          . sodium chloride 20 mL/hr at 01/19/13 1146  . sodium chloride 100 mL/hr at 01/19/13 1147  . [START ON 01/20/2013] sodium chloride    . dexmedetomidine Stopped (01/19/13 1400)  . insulin (NOVOLIN-R) infusion 0.3 Units/hr (01/19/13 1459)  . lactated ringers 20 mL/hr at 01/19/13 1130  . nitroGLYCERIN 30 mcg/min (01/19/13 1800)  . phenylephrine (NEO-SYNEPHRINE) Adult infusion       Lab Results  Component Value Date   WBC 7.4 01/19/2013   HGB 12.2* 01/19/2013   HCT 36.0* 01/19/2013   PLT 93* 01/19/2013   GLUCOSE 107* 01/19/2013   ALT 36 01/15/2013   AST 33 01/15/2013   NA 137 01/19/2013   K 4.8 01/19/2013   CL 106 01/19/2013   CREATININE 1.10 01/19/2013   BUN 24* 01/19/2013   CO2 26 01/15/2013   INR 1.41 01/19/2013   HGBA1C 7.6* 01/15/2013  extubated neuro intact, needs some pain meds  Not bleeding   Delight Ovens MD  Beeper [phone] Office [phone] 01/19/2013 6:19 PM

## 2013-01-19 NOTE — OR Nursing (Signed)
Second call - 15 minute to SICU at 1050

## 2013-01-20 ENCOUNTER — Inpatient Hospital Stay (HOSPITAL_COMMUNITY): Payer: Medicare Other

## 2013-01-20 ENCOUNTER — Other Ambulatory Visit: Payer: Self-pay | Admitting: *Deleted

## 2013-01-20 DIAGNOSIS — I359 Nonrheumatic aortic valve disorder, unspecified: Secondary | ICD-10-CM

## 2013-01-20 DIAGNOSIS — I369 Nonrheumatic tricuspid valve disorder, unspecified: Secondary | ICD-10-CM

## 2013-01-20 LAB — GLUCOSE, CAPILLARY
Glucose-Capillary: 80 mg/dL (ref 70–99)
Glucose-Capillary: 115 mg/dL — ABNORMAL HIGH (ref 70–99)
Glucose-Capillary: 154 mg/dL — ABNORMAL HIGH (ref 70–99)
Glucose-Capillary: 78 mg/dL (ref 70–99)
Glucose-Capillary: 101 mg/dL — ABNORMAL HIGH (ref 70–99)
Glucose-Capillary: 106 mg/dL — ABNORMAL HIGH (ref 70–99)
Glucose-Capillary: 102 mg/dL — ABNORMAL HIGH (ref 70–99)
Glucose-Capillary: 105 mg/dL — ABNORMAL HIGH (ref 70–99)
Glucose-Capillary: 85 mg/dL (ref 70–99)
Glucose-Capillary: 167 mg/dL — ABNORMAL HIGH (ref 70–99)
Glucose-Capillary: 221 mg/dL — ABNORMAL HIGH (ref 70–99)
Glucose-Capillary: 232 mg/dL — ABNORMAL HIGH (ref 70–99)
Glucose-Capillary: 196 mg/dL — ABNORMAL HIGH (ref 70–99)
Glucose-Capillary: 161 mg/dL — ABNORMAL HIGH (ref 70–99)

## 2013-01-20 LAB — BASIC METABOLIC PANEL
CO2: 27 mEq/L (ref 19–32)
Calcium: 7.9 mg/dL — ABNORMAL LOW (ref 8.4–10.5)
Chloride: 102 mEq/L (ref 96–112)
Potassium: 4.2 mEq/L (ref 3.5–5.1)
Sodium: 135 mEq/L (ref 135–145)

## 2013-01-20 LAB — CBC
Hemoglobin: 10.9 g/dL — ABNORMAL LOW (ref 13.0–17.0)
MCHC: 35 g/dL (ref 30.0–36.0)
Platelets: 81 10*3/uL — ABNORMAL LOW (ref 150–400)
RBC: 3.27 MIL/uL — ABNORMAL LOW (ref 4.22–5.81)
RBC: 3.37 MIL/uL — ABNORMAL LOW (ref 4.22–5.81)
WBC: 5.6 10*3/uL (ref 4.0–10.5)
WBC: 12.2 10*3/uL — ABNORMAL HIGH (ref 4.0–10.5)

## 2013-01-20 LAB — CREATININE, SERUM
Creatinine, Ser: 1.16 mg/dL (ref 0.50–1.35)
GFR calc non Af Amer: 58 mL/min — ABNORMAL LOW (ref >90)

## 2013-01-20 LAB — POCT I-STAT, CHEM 8
BUN: 23 mg/dL (ref 6–23)
Calcium, Ion: 1.08 mmol/L — ABNORMAL LOW (ref 1.13–1.30)
Creatinine, Ser: 1.2 mg/dL (ref 0.50–1.35)
Glucose, Bld: 177 mg/dL — ABNORMAL HIGH (ref 70–99)
TCO2: 24 mmol/L (ref 0–100)

## 2013-01-20 LAB — MAGNESIUM
Magnesium: 2.5 mg/dL (ref 1.5–2.5)
Magnesium: 2.3 mg/dL (ref 1.5–2.5)

## 2013-01-20 MED ORDER — FUROSEMIDE 10 MG/ML IJ SOLN
INTRAMUSCULAR | Status: AC
  Filled 2013-01-20: qty 2

## 2013-01-20 MED ORDER — INSULIN DETEMIR 100 UNIT/ML ~~LOC~~ SOLN
20.0000 [IU] | Freq: Every day | SUBCUTANEOUS | Status: DC
  Administered 2013-01-20: 20 [IU] via SUBCUTANEOUS
  Filled 2013-01-20 (×2): qty 0.2

## 2013-01-20 MED ORDER — WARFARIN SODIUM 7.5 MG PO TABS
7.5000 mg | ORAL_TABLET | Freq: Every day | ORAL | Status: DC
  Filled 2013-01-20: qty 1

## 2013-01-20 MED ORDER — METOPROLOL TARTRATE 12.5 MG HALF TABLET
12.5000 mg | ORAL_TABLET | Freq: Two times a day (BID) | ORAL | Status: DC
  Filled 2013-01-20 (×4): qty 1

## 2013-01-20 MED ORDER — ALBUMIN HUMAN 5 % IV SOLN
12.5000 g | Freq: Once | INTRAVENOUS | Status: AC
  Administered 2013-01-20: 12.5 g via INTRAVENOUS
  Filled 2013-01-20: qty 250

## 2013-01-20 MED ORDER — ASPIRIN EC 81 MG PO TBEC
81.0000 mg | DELAYED_RELEASE_TABLET | Freq: Every day | ORAL | Status: DC
  Administered 2013-01-20 – 2013-01-22 (×3): 81 mg via ORAL
  Filled 2013-01-20 (×3): qty 1

## 2013-01-20 MED ORDER — INSULIN ASPART 100 UNIT/ML ~~LOC~~ SOLN
0.0000 [IU] | SUBCUTANEOUS | Status: DC
  Administered 2013-01-20: 2 [IU] via SUBCUTANEOUS

## 2013-01-20 MED ORDER — SODIUM CHLORIDE 0.9 % IV SOLN
INTRAVENOUS | Status: DC
  Administered 2013-01-20: 21:00:00 via INTRAVENOUS
  Filled 2013-01-20: qty 1

## 2013-01-20 MED ORDER — WARFARIN - PHYSICIAN DOSING INPATIENT
Freq: Every day | Status: DC
  Administered 2013-01-20: 18:00:00

## 2013-01-20 MED ORDER — ALBUMIN HUMAN 5 % IV SOLN
INTRAVENOUS | Status: AC
  Filled 2013-01-20: qty 250

## 2013-01-20 MED ORDER — MORPHINE SULFATE 2 MG/ML IJ SOLN
1.0000 mg | INTRAMUSCULAR | Status: DC | PRN
  Administered 2013-01-20 (×3): 1 mg via INTRAVENOUS
  Filled 2013-01-20 (×2): qty 1

## 2013-01-20 MED ORDER — METOPROLOL TARTRATE 12.5 MG HALF TABLET
12.5000 mg | ORAL_TABLET | Freq: Two times a day (BID) | ORAL | Status: DC
  Filled 2013-01-20 (×2): qty 1

## 2013-01-20 MED ORDER — ALBUMIN HUMAN 5 % IV SOLN
12.5000 g | Freq: Once | INTRAVENOUS | Status: AC
  Administered 2013-01-20: 12.5 g via INTRAVENOUS

## 2013-01-20 MED ORDER — WARFARIN SODIUM 5 MG PO TABS
5.0000 mg | ORAL_TABLET | Freq: Every day | ORAL | Status: DC
  Administered 2013-01-20 – 2013-01-21 (×2): 5 mg via ORAL
  Filled 2013-01-20 (×3): qty 1

## 2013-01-20 MED FILL — Potassium Chloride Inj 2 mEq/ML: INTRAVENOUS | Qty: 40 | Status: AC

## 2013-01-20 MED FILL — Heparin Sodium (Porcine) Inj 1000 Unit/ML: INTRAMUSCULAR | Qty: 30 | Status: AC

## 2013-01-20 MED FILL — Dexmedetomidine HCl IV Soln 200 MCG/2ML: INTRAVENOUS | Qty: 2 | Status: AC

## 2013-01-20 MED FILL — Sodium Chloride IV Soln 0.9%: INTRAVENOUS | Qty: 1000 | Status: AC

## 2013-01-20 MED FILL — Magnesium Sulfate Inj 50%: INTRAMUSCULAR | Qty: 10 | Status: AC

## 2013-01-20 NOTE — Progress Notes (Signed)
POD # 1 TAVR  Comfortable, eating dinner  BP 91/47  Pulse 89  Temp(Src) 97.9 F (36.6 C) (Oral)  Resp 23  Wt 185 lb 6.5 oz (84.1 kg)  BMI 27.78 kg/m2  SpO2 96%   Intake/Output Summary (Last 24 hours) at 01/20/13 1758 Last data filed at 01/20/13 1600  Gross per 24 hour  Intake 3018.76 ml  Output   1285 ml  Net 1733.76 ml    Doing well

## 2013-01-20 NOTE — Progress Notes (Signed)
*  PRELIMINARY RESULTS* Echocardiogram 2D Echocardiogram has been performed.  [name] 01/20/2013, 3:42 PM

## 2013-01-20 NOTE — Progress Notes (Addendum)
   CARDIOTHORACIC SURGERY PROGRESS NOTE   R1 Day Post-Op Procedure(s) (LRB): TRANSCATHETER AORTIC VALVE REPLACEMENT, TRANSAPICAL (N/A) INTRAOPERATIVE TRANSESOPHAGEAL ECHOCARDIOGRAM (N/A)  Subjective: Looks very good.  Mild soreness in chest.  No SOB  Objective: Vital signs: BP Readings from Last 1 Encounters:  01/20/13 117/54   Pulse Readings from Last 1 Encounters:  01/20/13 79   Resp Readings from Last 1 Encounters:  01/20/13 29   Temp Readings from Last 1 Encounters:  01/20/13 100.6 F (38.1 C) Core (Comment)    Hemodynamics: PAP: (23-68)/(1-26) 50/18 mmHg CO:  [2.9 L/min-7.7 L/min] 7.7 L/min CI:  [1.5 L/min/m2-3.9 L/min/m2] 3.9 L/min/m2  Physical Exam:  Rhythm:   Afib  Breath sounds: clear  Heart sounds:  irreg  Incisions:  Dressing dry  Abdomen:  soft  Extremities:  warm   Intake/Output from previous day: 03/18 0701 - 03/19 0700 In: 5677.9 [I.V.:3777.9; IV Piggyback:1900] Out: 2195 [Urine:1635; Blood:100; Chest Tube:460] Intake/Output this shift: Total I/O In: 113.4 [I.V.:63.4; IV Piggyback:50] Out: 75 [Urine:75]  Lab Results:  Recent Labs  01/19/13 1600 01/19/13 1637 01/20/13 0420  WBC 7.4  --  5.6  HGB 11.5* 12.2* 10.7*  HCT 33.2* 36.0* 30.8*  PLT 93*  --  81*   BMET:  Recent Labs  01/19/13 1637 01/20/13 0420  NA 137 135  K 4.8 4.2  CL 106 102  CO2  --  27  GLUCOSE 107* 111*  BUN 24* 22  CREATININE 1.10 1.00  CALCIUM  --  7.9*    CBG (last 3)   Recent Labs  01/20/13 0504 01/20/13 0602 01/20/13 0657  GLUCAP 106* 106* 102*   ABG    Component Value Date/Time   PHART 7.380 01/19/2013 1632   HCO3 26.3* 01/19/2013 1632   TCO2 28 01/19/2013 1637   ACIDBASEDEF 1.0 12/16/2012 1146   O2SAT 95.0 01/19/2013 1632   CXR: *RADIOLOGY REPORT*  Clinical Data: Postop  PORTABLE CHEST - 1 VIEW  Comparison: 01/19/2013  Findings: Mild cardiomegaly. Low volumes. Endotracheal and NG  tubes removed. Bibasilar atelectasis. No pneumothorax.  Stable  [name]-Ganz catheter with its tip in the descending branch of the  right pulmonary artery.  IMPRESSION:  Extubated.  Increased bibasilar atelectasis.  [name]-Ganz catheter tip in the descending branch of the right  pulmonary artery.  Original Report Authenticated By: Jolaine Click, M.D.   Assessment/Plan: S/P Procedure(s) (LRB): TRANSCATHETER AORTIC VALVE REPLACEMENT, TRANSAPICAL (N/A) INTRAOPERATIVE TRANSESOPHAGEAL ECHOCARDIOGRAM (N/A)  Doing well POD1 Stable hemodynamics w/ chronic Afib, currently on low dose Neo drip Expected post op acute blood loss anemia, mild, stable Type II diabetes mellitus, recently diagnosed, excellent glycemic control on insulin drip Post-op thrombocytopenia, mild   Wean Neo off  D/C tubes and lines  Restart digoxin and metoprolol for rate control  Restart coumadin slowly  Hold ACE-I until BP increases some  Watch platelet count  Levemir + sliding scale insulin for now, restart oral agents as diet improves  Mobilize  ECHO  [name] H 01/20/2013 9:06 AM

## 2013-01-21 ENCOUNTER — Inpatient Hospital Stay (HOSPITAL_COMMUNITY): Payer: Medicare Other

## 2013-01-21 ENCOUNTER — Encounter (HOSPITAL_COMMUNITY): Payer: Self-pay | Admitting: Thoracic Surgery (Cardiothoracic Vascular Surgery)

## 2013-01-21 DIAGNOSIS — I359 Nonrheumatic aortic valve disorder, unspecified: Principal | ICD-10-CM

## 2013-01-21 LAB — GLUCOSE, CAPILLARY
Glucose-Capillary: 98 mg/dL (ref 70–99)
Glucose-Capillary: 123 mg/dL — ABNORMAL HIGH (ref 70–99)
Glucose-Capillary: 186 mg/dL — ABNORMAL HIGH (ref 70–99)
Glucose-Capillary: 191 mg/dL — ABNORMAL HIGH (ref 70–99)
Glucose-Capillary: 276 mg/dL — ABNORMAL HIGH (ref 70–99)

## 2013-01-21 LAB — CBC
HCT: 29.7 % — ABNORMAL LOW (ref 39.0–52.0)
MCH: 32.4 pg (ref 26.0–34.0)
MCHC: 34.3 g/dL (ref 30.0–36.0)
RDW: 13.1 % (ref 11.5–15.5)

## 2013-01-21 LAB — BASIC METABOLIC PANEL
BUN: 27 mg/dL — ABNORMAL HIGH (ref 6–23)
Chloride: 96 mEq/L (ref 96–112)
Creatinine, Ser: 1.18 mg/dL (ref 0.50–1.35)
GFR calc Af Amer: 66 mL/min — ABNORMAL LOW (ref >90)

## 2013-01-21 LAB — PROTIME-INR: INR: 1.76 — ABNORMAL HIGH (ref 0.00–1.49)

## 2013-01-21 MED ORDER — GLIPIZIDE 5 MG PO TABS
5.0000 mg | ORAL_TABLET | Freq: Two times a day (BID) | ORAL | Status: DC
  Administered 2013-01-21 – 2013-01-22 (×3): 5 mg via ORAL
  Filled 2013-01-21 (×6): qty 1

## 2013-01-21 MED ORDER — POTASSIUM CHLORIDE 10 MEQ/50ML IV SOLN
10.0000 meq | INTRAVENOUS | Status: AC
  Administered 2013-01-21 (×2): 10 meq via INTRAVENOUS

## 2013-01-21 MED ORDER — INSULIN ASPART 100 UNIT/ML ~~LOC~~ SOLN
0.0000 [IU] | SUBCUTANEOUS | Status: AC
  Administered 2013-01-21: 4 [IU] via SUBCUTANEOUS
  Administered 2013-01-21: 2 [IU] via SUBCUTANEOUS

## 2013-01-21 MED ORDER — SODIUM CHLORIDE 0.9 % IV SOLN: 250.0000 mL | INTRAVENOUS | Status: DC | PRN

## 2013-01-21 MED ORDER — METOPROLOL TARTRATE 12.5 MG HALF TABLET
12.5000 mg | ORAL_TABLET | Freq: Two times a day (BID) | ORAL | Status: DC
  Administered 2013-01-21 – 2013-01-22 (×3): 12.5 mg via ORAL
  Filled 2013-01-21 (×4): qty 1

## 2013-01-21 MED ORDER — SODIUM CHLORIDE 0.9 % IJ SOLN: 3.0000 mL | INTRAMUSCULAR | Status: DC | PRN

## 2013-01-21 MED ORDER — POTASSIUM CHLORIDE CRYS ER 20 MEQ PO TBCR
20.0000 meq | EXTENDED_RELEASE_TABLET | Freq: Two times a day (BID) | ORAL | Status: DC
  Administered 2013-01-22: 20 meq via ORAL
  Filled 2013-01-21 (×2): qty 1

## 2013-01-21 MED ORDER — METFORMIN HCL 500 MG PO TABS
1000.0000 mg | ORAL_TABLET | Freq: Two times a day (BID) | ORAL | Status: DC
  Administered 2013-01-21 – 2013-01-22 (×2): 1000 mg via ORAL
  Filled 2013-01-21 (×4): qty 2

## 2013-01-21 MED ORDER — INSULIN ASPART 100 UNIT/ML ~~LOC~~ SOLN: 0.0000 [IU] | SUBCUTANEOUS | Status: DC

## 2013-01-21 MED ORDER — INSULIN ASPART 100 UNIT/ML ~~LOC~~ SOLN
0.0000 [IU] | Freq: Three times a day (TID) | SUBCUTANEOUS | Status: DC
  Administered 2013-01-21: 12 [IU] via SUBCUTANEOUS
  Administered 2013-01-21: 4 [IU] via SUBCUTANEOUS

## 2013-01-21 MED ORDER — FUROSEMIDE 40 MG PO TABS
40.0000 mg | ORAL_TABLET | Freq: Two times a day (BID) | ORAL | Status: DC
  Administered 2013-01-21 – 2013-01-22 (×3): 40 mg via ORAL
  Filled 2013-01-21 (×6): qty 1

## 2013-01-21 MED ORDER — POTASSIUM CHLORIDE 10 MEQ/50ML IV SOLN
10.0000 meq | INTRAVENOUS | Status: AC | PRN
  Administered 2013-01-21 (×3): 10 meq via INTRAVENOUS
  Filled 2013-01-21: qty 150

## 2013-01-21 MED ORDER — SODIUM CHLORIDE 0.9 % IJ SOLN
3.0000 mL | Freq: Two times a day (BID) | INTRAMUSCULAR | Status: DC
  Administered 2013-01-21: 3 mL via INTRAVENOUS

## 2013-01-21 MED ORDER — MOVING RIGHT ALONG BOOK
Freq: Once | Status: AC
  Administered 2013-01-21: 09:00:00
  Filled 2013-01-21: qty 1

## 2013-01-21 NOTE — Progress Notes (Signed)
CARDIOTHORACIC SURGERY PROGRESS NOTE   R2 Days Post-Op Procedure(s) (LRB): TRANSCATHETER AORTIC VALVE REPLACEMENT, TRANSAPICAL (N/A) INTRAOPERATIVE TRANSESOPHAGEAL ECHOCARDIOGRAM (N/A)  Subjective: Looks good.  Hungry and looking for breakfast.  Didn't sleep much. Denies pain. + SOB  Objective: Vital signs: BP Readings from Last 1 Encounters:  01/21/13 157/46   Pulse Readings from Last 1 Encounters:  01/21/13 92   Resp Readings from Last 1 Encounters:  01/21/13 23   Temp Readings from Last 1 Encounters:  01/21/13 97.5 F (36.4 C) Oral    Hemodynamics: PAP: (38-40)/(15) 40/15 mmHg  Physical Exam:  Rhythm:   Afib  Breath sounds: clear  Heart sounds:  irreg  Incisions:  Clean and dry  Abdomen:  soft  Extremities:  warm   Intake/Output from previous day: 03/19 0701 - 03/20 0700 In: 2533.1 [P.O.:1120; I.V.:963.1; IV Piggyback:450] Out: 1095 [Urine:1045; Chest Tube:50] Intake/Output this shift: Total I/O In: -  Out: 75 [Urine:75]  Lab Results:  Recent Labs  01/20/13 1700 01/20/13 1707 01/21/13 0417  WBC 12.2*  --  9.1  HGB 10.9* 11.6* 10.2*  HCT 31.1* 34.0* 29.7*  PLT 71*  --  67*   BMET:  Recent Labs  01/20/13 0420  01/20/13 1707 01/21/13 0417  NA 135  --  133* 130*  K 4.2  --  4.2 3.4*  CL 102  --  98 96  CO2 27  --   --  25  GLUCOSE 111*  --  177* 97  BUN 22  --  23 27*  CREATININE 1.00  < > 1.20 1.18  CALCIUM 7.9*  --   --  7.7*  < > = values in this interval not displayed.  CBG (last 3)   Recent Labs  01/21/13 0258 01/21/13 0358 01/21/13 0501  GLUCAP 112* 91 91   ABG    Component Value Date/Time   PHART 7.380 01/19/2013 1632   HCO3 26.3* 01/19/2013 1632   TCO2 24 01/20/2013 1707   ACIDBASEDEF 1.0 12/16/2012 1146   O2SAT 95.0 01/19/2013 1632   CXR: *RADIOLOGY REPORT*  Clinical Data: Postoperative chest radiograph.  PORTABLE CHEST - 1 VIEW  Comparison: 01/20/2013.  Findings: Cardiomegaly. Worsening left basilar aeration  secondary  to increasing atelectasis. The [name]-Ganz catheter has been  removed. The right IJ vascular sheath persists. Monitoring leads  are projected over the chest. There is no airspace disease. Lung  volumes are lower.  IMPRESSION:  1. Interval removal of [name]-Ganz catheter.  2. Lower volumes with left greater than right basilar atelectasis.  3. Unchanged cardiomegaly. No edema or failure.  Original Report Authenticated By: Andreas Newport, M.D.     Transthoracic Echocardiography  Patient: Marck, Mcclenny MR #: [id number] Study Date: 01/20/2013 Gender: M Age: 79 Height: Weight: 84.1kg BSA: Pt. 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 Risk factors: Hypertension. Diabetes mellitus. Dyslipidemia.  ------------------------------------------------------------ Study Conclusions  - Left ventricle: Can not rule out a small area of hypokinesis at the apex. The cavity size was normal. Wall thickness was increased in a pattern of moderate to severe LVH. The estimated ejection fraction was 65%. Wall motion was normal; there were no regional wall motion abnormalities. - Aortic valve: The prosthetic valve is working well. No significant regurgitation. Valve area: 1.73cm^2(VTI). Valve area: 1.9cm^2 (Vmax). - Mitral valve: Calcified annulus. - Left atrium: The atrium was  moderately dilated. - Right ventricle: The cavity size was mildly dilated. Systolic function was mildly reduced. - Right atrium: The atrium was mildly dilated. - Pulmonary arteries: PA peak pressure: 49mm Hg (S). Transthoracic echocardiography. M-mode, limited 2D, limited spectral Doppler, and color Doppler. Weight:  Weight: 84.1kg. Weight: 185lb. Blood pressure: 105/61. Patient status: Inpatient. Location: Bedside.  ------------------------------------------------------------  ------------------------------------------------------------ Left ventricle: Can not rule out a small area of hypokinesis at the apex. The cavity size was normal. Wall thickness was increased in a pattern of moderate to severe LVH. The estimated ejection fraction was 65%. Wall motion was normal; there were no regional wall motion abnormalities.  ------------------------------------------------------------ Aortic valve: The prosthetic valve is working well. Doppler: No significant regurgitation. VTI ratio of LVOT to aortic valve: 0.55. Valve area: 1.73cm^2(VTI). Peak velocity ratio of LVOT to aortic valve: 0.6. Valve area: 1.9cm^2 (Vmax). Mean gradient: 7mm Hg (S). Peak gradient: 13mm Hg (S).  ------------------------------------------------------------ Aorta: Aortic root: The aortic root was normal in size.  ------------------------------------------------------------ Mitral valve: Calcified annulus. Doppler: No significant regurgitation. Peak gradient: 2mm Hg (D).  ------------------------------------------------------------ Left atrium: The atrium was moderately dilated.  ------------------------------------------------------------ Right ventricle: The cavity size was mildly dilated. Systolic function was mildly reduced.  ------------------------------------------------------------ Pulmonic valve: The valve appears to be grossly normal. Doppler: No significant regurgitation.  ------------------------------------------------------------ Tricuspid valve: The valve appears to be grossly normal. Doppler: Mild regurgitation.  ------------------------------------------------------------ Right atrium: The atrium was mildly dilated.  ------------------------------------------------------------ Pericardium: There  was no pericardial effusion.  ------------------------------------------------------------  2D measurements Normal Doppler measurements Normal Left ventricle Main pulmonary LVID ED, 34.5 mm 43-52 artery chord, PLAX Pressure, S 49 mm =30 LVID ES, 27.4 mm 23-38 Hg chord, PLAX LVOT FS, chord, 21 % >29 Peak vel, S 110 cm/s ------ PLAX VTI, S 15. cm ------ LVPW, ED 19.3 mm ------ 6 IVS/LVPW 0.9 <1.3 Aortic valve ratio, ED Peak vel, S 182 cm/s ------ Ventricular septum Mean vel, S 121 cm/s ------ IVS, ED 17.4 mm ------ VTI, S 28. cm ------ LVOT 3 Diam, S 20 mm ------ Mean 7 mm ------ Area 3.14 cm^2 ------ gradient, S Hg Aorta Peak 13 mm ------ Root diam, 28 mm ------ gradient, S Hg ED VTI ratio 0.5 ------ Left atrium LVOT/AV 5 AP dim 54 mm ------ Area, VTI 1.7 cm^2 ------ 3 Peak vel 0.6 ------ ratio, LVOT/AV Area, Vmax 1.9 cm^2 ------ Regurg PHT 259 ms ------ Mitral valve Peak E vel 75. cm/s ------ 5 Deceleration 208 ms 150-23 time 0 Peak 2 mm ------ gradient, D Hg Tricuspid valve Regurg peak 293 cm/s ------ vel Peak RV-RA 34 mm ------ gradient, S Hg Systemic veins Estimated CVP 15 mm ------ Hg Right ventricle Pressure, S 49 mm <30 Hg  ------------------------------------------------------------ Prepared and Electronically Authenticated by  Willa Rough 2014-03-19T18:[id number]   Assessment/Plan: S/P Procedure(s) (LRB): TRANSCATHETER AORTIC VALVE REPLACEMENT, TRANSAPICAL (N/A) INTRAOPERATIVE TRANSESOPHAGEAL ECHOCARDIOGRAM (N/A)  Doing very well POD2 Stable rate-controlled Afib w/ stable BP off Neo drip Expected post op acute blood loss anemia, mild, stable Expected post op volume excess, mild Type II diabetes mellitus, good glycemic control   Mobilize  Transfer step down  Restart oral agents for DMII and d/c levemir insulin  Continue coumadin 5 mg daily for now  Restart ACE-I in 1-2 days if BP continues to trend up  Gentle  diuresis    OWEN,CLARENCE H 01/21/2013 8:28 AM

## 2013-01-21 NOTE — Plan of Care (Signed)
Problem: Phase III Progression Outcomes Goal: Transfer to PCTU/Telemetry POD Outcome: Completed/Met Date Met:  01/21/13 Transferred: 01/21/13 Time: 1415

## 2013-01-21 NOTE — Progress Notes (Signed)
Pt and belongings, including shaving kit, transferred to room 2015.  Ambulated ~750 feet to new room on room air; positioned comfortably in chair, on monitor with NT in room.  Vitals stable.  Report given to [facility].  Pt tolerated transfer well.  Ulis Rias, RN

## 2013-01-22 ENCOUNTER — Inpatient Hospital Stay (HOSPITAL_COMMUNITY): Payer: Medicare Other

## 2013-01-22 DIAGNOSIS — I4891 Unspecified atrial fibrillation: Secondary | ICD-10-CM

## 2013-01-22 LAB — TYPE AND SCREEN
Unit division: 0
Unit division: 0

## 2013-01-22 LAB — CBC
MCH: 32.6 pg (ref 26.0–34.0)
MCHC: 35.7 g/dL (ref 30.0–36.0)
Platelets: 79 10*3/uL — ABNORMAL LOW (ref 150–400)

## 2013-01-22 LAB — PROTIME-INR: Prothrombin Time: 16.9 seconds — ABNORMAL HIGH (ref 11.6–15.2)

## 2013-01-22 LAB — BASIC METABOLIC PANEL
Calcium: 8.4 mg/dL (ref 8.4–10.5)
GFR calc non Af Amer: 54 mL/min — ABNORMAL LOW (ref >90)
Glucose, Bld: 98 mg/dL (ref 70–99)
Sodium: 133 mEq/L — ABNORMAL LOW (ref 135–145)

## 2013-01-22 LAB — GLUCOSE, CAPILLARY

## 2013-01-22 MED ORDER — LISINOPRIL-HYDROCHLOROTHIAZIDE 20-12.5 MG PO TABS: 1.0000 | ORAL_TABLET | Freq: Every day | ORAL | Status: DC

## 2013-01-22 MED ORDER — ASPIRIN 81 MG PO TBEC: 81.0000 mg | DELAYED_RELEASE_TABLET | Freq: Every day | ORAL | Status: DC

## 2013-01-22 MED ORDER — TRAMADOL HCL 50 MG PO TABS: 50.0000 mg | ORAL_TABLET | ORAL | Status: DC | PRN

## 2013-01-22 NOTE — Progress Notes (Addendum)
[name]       [place] [address]             [phone]    3 Days Post-Op  Procedure(s) (LRB): TRANSCATHETER AORTIC VALVE REPLACEMENT, TRANSAPICAL (N/A) INTRAOPERATIVE TRANSESOPHAGEAL ECHOCARDIOGRAM (N/A) Subjective: Looks and feels well  Objective  Telemetry afib with CVR  Temp:  [97.5 F (36.4 C)-99.2 F (37.3 C)] 97.6 F (36.4 C) (03/21 0602) Pulse Rate:  [66-99] 66 (03/21 0602) Resp:  [18-25] 18 (03/21 0602) BP: (120-139)/(39-61) 123/41 mmHg (03/21 0602) SpO2:  [93 %-97 %] 96 % (03/21 0602) Arterial Line BP: (140)/(36) 140/36 mmHg (03/20 0800) Weight:  [188 lb 7.9 oz (85.5 kg)] 188 lb 7.9 oz (85.5 kg) (03/21 0602)   Intake/Output Summary (Last 24 hours) at 01/22/13 0745 Last data filed at 01/22/13 0048  Gross per 24 hour  Intake   1160 ml  Output   1105 ml  Net     55 ml       General appearance: alert, cooperative and no distress Heart: irregularly irregular rhythm and no run, soft systolic murmur(Ao) Lungs: clear to auscultation bilaterally Abdomen: benign Extremities:  + LE edema, venous stasis changes Wound: incis ok, some echymosis  Lab Results:  Recent Labs  01/20/13 0420 01/20/13 1700  01/21/13 0417 01/22/13 0505  NA 135  --   < > 130* 133*  K 4.2  --   < > 3.4* 4.4  CL 102  --   < > 96 98  CO2 27  --   --  25 26  GLUCOSE 111*  --   < > 97 98  BUN 22  --   < > 27* 29*  CREATININE 1.00 1.16  < > 1.18 1.24  CALCIUM 7.9*  --   --  7.7* 8.4  MG 2.5 2.3  --   --   --   < > = values in this interval not displayed. No results found for this basename: AST, ALT, ALKPHOS, BILITOT, PROT, ALBUMIN,  in the last 72 hours No results found for this basename: LIPASE, AMYLASE,  in the last 72 hours  Recent Labs  01/21/13 0417 01/22/13 0505  WBC 9.1 6.0  HGB 10.2* 11.1*  HCT 29.7* 31.1*  MCV 94.3 91.5  PLT 67* 79*   No results found for this basename: CKTOTAL, CKMB, TROPONINI,  in the last 72 hours No components found with  this basename: POCBNP,  No results found for this basename: DDIMER,  in the last 72 hours No results found for this basename: HGBA1C,  in the last 72 hours No results found for this basename: CHOL, HDL, LDLCALC, TRIG, CHOLHDL,  in the last 72 hours No results found for this basename: TSH, T4TOTAL, FREET3, T3FREE, THYROIDAB,  in the last 72 hours No results found for this basename: VITAMINB12, FOLATE, FERRITIN, TIBC, IRON, RETICCTPCT,  in the last 72 hours  Medications: Scheduled . acetaminophen  1,000 mg Oral Q6H  . aspirin EC  81 mg Oral Daily  . bisacodyl  10 mg Oral Daily   Or  . bisacodyl  10 mg Rectal Daily  . digoxin  0.25 mg Oral Daily  . docusate sodium  200 mg Oral Daily  . doxazosin  8 mg Oral QHS  . furosemide  40 mg Oral BID  . glipiZIDE  5 mg Oral BID AC  . insulin aspart  0-24 Units Subcutaneous TID AC & HS  . metFORMIN  1,000 mg Oral BID WC  .  metoprolol tartrate  12.5 mg Oral BID  . pantoprazole  40 mg Oral Daily  . potassium chloride  20 mEq Oral BID  . simvastatin  40 mg Oral QHS  . sodium chloride  3 mL Intravenous Q12H  . sodium chloride  3 mL Intravenous Q12H  . warfarin  5 mg Oral q1800  . Warfarin - Physician Dosing Inpatient   Does not apply q1800     Radiology/Studies:  Dg Chest Port 1 View  01/21/2013  *RADIOLOGY REPORT*  Clinical Data: Postoperative chest radiograph.  PORTABLE CHEST - 1 VIEW  Comparison: 01/20/2013.  Findings: Cardiomegaly.  Worsening left basilar aeration secondary to increasing atelectasis.  The [name]-Ganz catheter has been removed.  The right IJ vascular sheath persists. Monitoring leads are projected over the chest.  There is no airspace disease.  Lung volumes are lower.  IMPRESSION:  1.  Interval removal of [name]-Ganz catheter. 2.  Lower volumes with left greater than right basilar atelectasis. 3.  Unchanged cardiomegaly.  No edema or failure.   Original Report Authenticated By: Andreas Newport, M.D.     INR:1.4 Will add last result  for INR, ABG once components are confirmed Will add last 4 CBG results once components are confirmed  Assessment/Plan: S/P Procedure(s) (LRB): TRANSCATHETER AORTIC VALVE REPLACEMENT, TRANSAPICAL (N/A) INTRAOPERATIVE TRANSESOPHAGEAL ECHOCARDIOGRAM (N/A)  1. Doing very well 2 creat slow upward trend but still within WNL., excellent UO, , I/O about even (+4ml)- cont gentle diuresis 3 mild hyponatremia- trending towards NL. 4 INR 1.4- cont ACRX 5 afib with CVR- chronic 6 Results for [name], [name] (MRN [id number]) as of 01/22/2013 08:00  Ref. Range 01/21/2013 17:06 01/21/2013 21:01 01/22/2013 05:05 01/22/2013 05:21 01/22/2013 06:06  Glucose-Capillary Latest Range: 70-99 mg/dL 409 (H) 811 (H)   86  Sugars somewhat labile- cont current rx and observe 7 H/H stable 8 poss D/C soon   LOS: 3 days    [name],[name] 3/21/20147:45 AM  I have seen and examined the patient and agree with the assessment and plan as outlined.  Doing very well.  D/C home today.  Restart home meds except 1/2 dose for Lisinopril/HCTZ   Bashir Marchetti H 01/22/2013 9:17 AM

## 2013-01-22 NOTE — Discharge Summary (Signed)
Physician Discharge Summary  Patient ID: [name] MRN: [id number] DOB/AGE: 1933/10/04 79 y.o.  Admit date: 01/19/2013 Discharge date: 01/22/2013  Admission Diagnoses: 1.Severe aortic stenosis 2.History of hypertension 3.History of DM 4.History of hyperlipidemia 5.History of a fib (on Coumadin) 6.History of tobacco abuse 7.History of prostate cancer 8.Thrbombocytopenia  Discharge Diagnoses:   1.Severe aortic stenosis 2.History of hypertension 3.History of DM 4.History of hyperlipidemia 5.History of a fib (on Coumadin) 6.History of tobacco abuse 7.History of prostate cancer 8.Mild ABL anemia 9.Thrombocytopenia   Procedure (s):  Transcatheter Aortic Valve Replacement - Transapical Approach Edwards Sapien THV (size 26 mm, model # 9000TFX, serial # [id number]) by Dr. Cornelius Moras and Excell Seltzer on 01/19/2013.  History of Presenting Illness: This is a 79 year old Caucasian male with history of aortic stenosis, chronic persistent atrial fibrillation, type II diabetes, and remote history of tobacco use who has been followed for years by Dr. Ladona Ridgel for management of atrial fibrillation. He has developed aortic stenosis, which has progressed in severity on followup echocardiography and which has been associated with development of symptoms of exertional shortness of breath and fatigue. Most recent echocardiogram demonstrates the presence of critical aortic stenosis with mean transvalvular gradient estimated 66 mmHg and peak velocity greater than 5 m/s. Cardiac catheterization confirmed the presence of severe aortic stenosis with mean gradient measured 51 mmHg and was also notable for the absence of significant coronary artery disease. There was moderate pulmonary hypertension and low normal left ventricular systolic function. The patient was referred to Dr. Tyrone Sage to consider conventional aortic valve replacement, but the patient was felt to be an extremely poor candidate for surgery do to the  presence of severe calcification of the entire ascending thoracic aorta. 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 During the course of his work up, he has been evaluated comprehensively by a multidisciplinary team of specialists coordinated through the [facility] in the [facility]. The patient has been counseled extensively as to the relative risks and benefits of all options for the treatment of severe aortic stenosis including long term medical therapy, conventional surgery for aortic valve replacement, and transcatheter aortic valve replacement. The patient has been independently evaluated by two cardiac surgeons including Dr. Tyrone Sage and myself, and both of [place] feel that the patient would be a poor candidate for conventional surgery (predicted risk of mortality >15% and/or predicted risk of permanent morbidity >50%) because of comorbidities including, most notably, his severely calcified, porcelain aorta. Based upon review of all of the patient's preoperative diagnostic tests, he is felt to be candidate for transcatheter aortic valve replacement using the transapical approach as an alternative to high risk conventional surgery. The patient has been counseled at length regarding the indications, risks and potential benefits of surgery. All questions have been answered, and the patient now presents for surgery having provided full informed consent for the operation as planned.  The patient describes symptoms of shortness of breath with moderate and strenuous physical exertion. He denies resting shortness of breath, PND, orthopnea, and lower extremity edema. He has long-standing atrial fibrillation and occasional palpitations. He denies any history of chest pain, chest tightness, dizzy spells, or syncope. He was admitted to [facility] on 01/19/2013 in order to undergo TAVAR.  Brief Hospital Course:  The  patient was extubated the afternoon of surgery without difficulty. He remained afebrile and hemodynamically stable. He was weaned off of Neo synephrine. Theone Murdoch, a  line, chest tube, and foley were removed early in the post operative course. Lopressor was started and titrated accordingly. He remained in a fib (prior history) with controlled ventricular rate.He was restarted on Digoxin as he was on this pre op. In addition, Coumadin was restarted slowly and a PT and INR were monitored daily.Echo done on 01/20/2013 showed an LVEF 65%, no significant aortic regurgitation, prosthetic valve was working well, mildly dilated RA, no significant pulmonic or mitral regurgitation, and mild TR.He was volume over loaded and diuresed. He was weaned off the insulin drip.  He has a history of DM.The patient's HGA1C pre op was 7.6.  He was restarted on his oral diabetic medications with good glucose control.He did have thrombocytopenia post op (also had pre op). His last platelet count was up to 79,000.He also had mild ABL anemia. His last H and H was up to 11.1 and 31.1.The patient was felt surgically stable for transfer from the ICU to PCTU for further convalescence on 01/21/2013. He has been  ambulating on room air. He has been tolerating a diet and has had a bowel movement.  He was evaluated by Dr. Cornelius Moras this morning and is felt surgically stable for discharge today.   Latest Vital Signs: Blood pressure 123/41, pulse 66, temperature 97.6 F (36.4 C), temperature source Oral, resp. rate 18, height 5\' 8"  (1.727 m), weight 85.5 kg (188 lb 7.9 oz), SpO2 96.00%.  Physical Exam: General appearance: alert, cooperative and no distress  Heart: irregularly irregular rhythm and no run, soft systolic murmur(Ao)  Lungs: clear to auscultation bilaterally  Abdomen: benign  Extremities: + LE edema, venous stasis changes  Wound: incis ok, some echymosis  Discharge Condition:Stable  Recent laboratory studies:  Lab Results   Component Value Date   WBC 6.0 01/22/2013   HGB 11.1* 01/22/2013   HCT 31.1* 01/22/2013   MCV 91.5 01/22/2013   PLT 79* 01/22/2013   Lab Results  Component Value Date   NA 133* 01/22/2013   K 4.4 01/22/2013   CL 98 01/22/2013   CO2 26 01/22/2013   CREATININE 1.24 01/22/2013   GLUCOSE 98 01/22/2013      Diagnostic Studies: Dg Chest 2 View  01/22/2013  *RADIOLOGY REPORT*  Clinical Data: Status post aortic valve repair  CHEST - 2 VIEW  Comparison: 01/21/2013  Findings: The cardiac shadow remains enlarged.  Postsurgical changes are again seen.  Mild bibasilar atelectatic changes are noted.  No focal confluent infiltrate is seen.  No pneumothorax is noted.  IMPRESSION: Stable bibasilar atelectatic changes.   Original Report Authenticated By: Alcide Clever, M.D.       Ct Coronary Morp W/cta Cor W/score W/ca W/cm &/or Wo/cm  12/30/2012  *RADIOLOGY REPORT*  INDICATION: 79 year old male with history of severe aortic stenosis.  Preprocedural study for possible transcatheter aortic valve replacement (TAVR) procedure.  CT ANGIOGRAPHY OF THE HEART, CORONARY ARTERY, STRUCTURE, AND MORPHOLOGY  COMPARISON:  No priors.  CONTRAST: 80mL OMNIPAQUE IOHEXOL 350 MG/ML SOLN  TECHNIQUE:  CT angiography of the coronary vessels was performed on a 256 channel system using prospective ECG gating.  A scout and ECG- gated noncontrast exam (for calcium scoring) were performed. Appropriate delay was determined by bolus tracking after injection of iodinated contrast, and an ECG-gated coronary CTA was performed with sub-mm slice collimation throughout the cardiac cycle. Imaging post processing was performed on an independent workstation creating multiplanar and 3-D images, allowing for quantitative analysis of the heart and coronary arteries.  Note that this exam  targets the heart and the chest was not imaged in its entirety.  PREMEDICATION: None.  FINDINGS: Technical quality: Good. Heart rate:  55-68 bpm (patient was in atrial  fibrillation).  CORONARY ARTERIES: Assessment not performed secondary to the excessive imaging noise, small amount of motion related artifact (related to irregular rhythm) and high degree of coronary artery calcification.  The patient has had a recent catheter angiogram as well.  Aortic Root Measurements Pertinent to Potential TAVR Procedure:  Aortic Valve Description:  The aortic valve was tricuspid with severe thickening and calcification of all of the cusps.  During systole, there was limited valve opening with an estimated aortic valve area of approximately 0.84 cm2 by planimetry.  Aortic Valve Area: 0.84 cm-sq  Annulus:       Planimetry - 5.6 cm2       Long & Short Axis - 31.0 x 25.2 mm       Circumference - 87.3 mm  Sinuses of Valsalva:       R SOV -     width 34.1 mm                   height 26.8 mm       L SOV -     width 33.5 mm                    height 20.3 mm       [place] SOV -    width 33.6 mm                   height 21.0 mm  Coronary Artery [place]:       L main - 15.2 mm from the annulus       RCA - 19.8 mm from the annulus  Ascending Aorta:       33.1 x 32.0 mm in diameter 4 cm above the annulus.  Potential Angiographic Angles: LAO 11 degrees, Caudal 7 degrees RAO 10 degrees, Caudal 28 degrees  OTHER AORTA AND PULMONARY MEASUREMENTS: Descending aorta:  (< 40 mm): 31 mm Main pulmonary artery: (< 30 mm): 28 mm  OTHER FINDINGS: Within the visualized portions of the thorax there are no suspicious appearing pulmonary nodules, areas of airspace consolidation or pleural effusions.  Mild diffuse bronchial wall thickening and some mild paraseptal emphysematous changes are noted. There is no significant pericardial fluid, thickening or pericardial calcification.  Numerous borderline enlarged and mildly enlarged mediastinal lymph nodes are noted, largest of which measures up to 1.3 cm in short axis in the subcarinal station. There are no aggressive appearing lytic or blastic lesions noted in the visualized portions  of the skeleton.  IMPRESSION: 1.  Severely sclerotic aortic valve with an estimated aortic valve area of 0.84 cm2 by planimetry.  Findings and measurements pertinent to potential TAVR procedure, as detailed above. 2. Numerous borderline enlarged and mildly enlarged mediastinal lymph nodes are nonspecific.  Similar findings were seen in the abdomen and pelvis (see that report for discussion and details). Clinical correlation is recommended.  Another differential consideration would include an underlying lymphoproliferative disorder. 3.  Mild diffuse bronchial wall thickening with mild paraseptal emphysematous changes.   Original Report Authenticated By: Trudie Reed, M.D.      Ct Angio Chest Aorta W/cm &/or Wo/cm  12/24/2012  *RADIOLOGY REPORT*  Clinical Data: Aortic stenosis.  Evaluate calcification level of the ascending aorta.  CT ANGIOGRAPHY CHEST  Technique:  Multidetector CT imaging of the chest using the  standard protocol during bolus administration of intravenous contrast. Multiplanar reconstructed images including MIPs were obtained and reviewed to evaluate the vascular anatomy.  Contrast: OMNIPAQUE IOHEXOL 350 MG/ML SOLN  Comparison: None.  Findings: There is extensive calcification within the aortic valve. There is also circumferential calcification in the ascending aorta extending to the level of the proximal aortic arch.  Aortic wall calcification is less prominent in the aortic arch and descending aorta.  No evidence of aortic dissection.  Maximal diameter of the ascending aorta is 3.8 cm. Mild atherosclerotic plaque in the descending aorta.  Innominate artery, right subclavian artery, left common carotid artery, left subclavian artery are patent.  Right internal carotid artery origin is tortuous but patent.  Left vertebral artery is grossly patent.  Right vertebral artery is diminutive and poorly visualized.  No obvious filling defects are present in the pulmonary arterial tree to suggest  acute pulmonary thromboembolism.  15 mm short axis diameter precarinal lymph node.  Scattered smaller nodes are seen within the mediastinum.  Negative abnormal hilar adenopathy.  Calcifications of the right coronary, left anterior descending coronary, and circumflex coronary artery are present. Mild mitral annular calcification.  The heart is markedly enlarged.  No pneumothorax.  No pleural effusion.  Cystic changes at the left lung base have a chronic appearance.  No cavitary lung mass.  No consolidation.  Diffuse hepatic steatosis.  Upper abdomen is otherwise benign.  No destructive bone lesion.  Scattered degenerative changes in the thoracic spine are noted.  IMPRESSION: There is extensive aortic valve calcifications as well as circumferential calcification in the ascending aorta to the proximal arch.  Less prominent calcification is present in the arch and descending aorta with mild scattered plaque in the descending aorta.  No evidence of significant stenosis in the great vessels.  The right vertebral artery, however is poorly visualized.  Single mildly enlarged mediastinal lymph node.  Differential diagnosis includes volume overload, inflammatory disease, and less likely malignancy given lack of pulmonary nodules or other evidence of metastatic disease.  Consider 20-month follow-up to ensure resolution.  Three-vessel coronary artery calcifications.  Diffuse hepatic steatosis  Marked cardiomegaly.   Original Report Authenticated By: Jolaine Click, M.D.    Ct Angio Abd/pel W/ And/or W/o  12/30/2012  *RADIOLOGY REPORT*  Clinical Data:  38-YEAR-OLD MALE WITH HISTORY OF SEVERE AORTIC STENOSIS.  PREPROCEDURAL STUDY FOR POSSIBLE TRANSCATHETER AORTIC VALVE REPLACEMENT (TAVR) PROCEDURE.  CT ANGIOGRAPHY ABDOMEN AND PELVIS  Technique:  Multidetector CT imaging of the abdomen and pelvis was performed using the standard protocol during bolus administration of intravenous contrast.  Multiplanar reconstructed images  including MIPs were obtained and reviewed to evaluate the vascular anatomy.  Contrast: 80mL OMNIPAQUE IOHEXOL 350 MG/ML SOLN  Comparison:  No priors.  Findings:  VASCULAR MEASUREMENTS PERTINENT TO TAVR:  AORTA:  Minimal Aortic Diameter -  13.1 x 12.9 mm (shortly above the aortic bifurcation)  Severity of Aortic Calcification -  Severe  RIGHT PELVIS:  Right Common Iliac Artery -  Minimal Diameter - 7.8 x 7.6 mm  Tortuosity - Mild  Calcification - Severe  Right External Iliac Artery -  Minimal Diameter - 7.3 x 7.5 mm  Tortuosity - Moderate  Calcification - Moderate to severe  Right Common Femoral Artery -  Minimal Diameter - 9.0 x 4.5 mm  Tortuosity - Mild  Calcification - Moderate  LEFT PELVIS:  Left Common Iliac Artery -  Minimal Diameter - 8.4 x 6.4 mm  Tortuosity - Mild  Calcification - Severe  Left External Iliac Artery -  Minimal Diameter - 9.5 x 5.3 mm  Tortuosity - Moderate  Calcification - Moderate to severe  Left Common Femoral Artery -  Minimal Diameter - 9.2 x 5.8 mm  Tortuosity - Mild  Calcification - Mild to moderate  OTHER FINDINGS  Lung Bases: Changes of paraseptal emphysema and diffuse bronchial wall thickening in the lung bases bilaterally.  Abdomen/Pelvis:  The appearance of the liver, gallbladder, spleen, bilateral adrenal glands and the left kidney is unremarkable. Multiple small foci of intraparenchymal fat in the pancreatic head (a benign finding).  Remainder the pancreas is otherwise normal in appearance.  In the upper pole of the right kidney there is an 8 mm low attenuation lesion which is incompletely characterized on today's examination, but statistically favored to represent a small cyst.   There is extensive atherosclerosis throughout the abdominal aorta and the visceral vasculature, most notably at the origin of the celiac axis where there is greater than 50% diameter stenosis. No aneurysm or dissection.  Normal appendix.  No significant volume of ascites.  No pneumoperitoneum.  No  pathologic distension of small bowel.  There are a few scattered colonic diverticula, without surrounding inflammatory changes to suggest an acute colonic diverticulitis at this time.  Fiducial markers are noted in the region of the prostate gland suggesting prior radiation therapy. There are multiple borderline enlarged and mildly enlarged retroperitoneal lymph nodes in the periaortic stations, measuring up to 11 mm in short axis.  Musculoskeletal: There are no aggressive appearing lytic or blastic lesions noted in the visualized portions of the skeleton. Multilevel degenerative disc disease and facet arthropathy is noted throughout the lumbar spine.  At L4-L5 there is 4 mm of anterolisthesis of L4 upon L5.  L1-L2 there is a 4 mm retrolisthesis of L1 upon L2.   Review of the MIP images confirms the above findings.  IMPRESSION: 1.  Vascular findings pertinent to potential TAVR procedure, as above.  Notably, the patient does not appear to be a suitable candidate for pelvic vascular access for placement of a Sapien valve, but may be a potential candidate for placement of a CORE valve. 2.  Possible hemodynamically significant stenosis at the ostium of the celiac axis. 3. Multiple borderline enlarged and minimally enlarged retroperitoneal lymph nodes are nonspecific.  However, given the fiducial markers in the region of the prostate gland, clinical correlation is recommended with consideration for assessment of PSA levels and potential further evaluation with PET CT if there is clinical concern for metastatic disease. 4.  Normal appendix. 5.  Additional incidental findings, as above.   Original Report Authenticated By: Trudie Reed, M.D.         Future Appointments Provider Department Dept Phone   01/27/2013 10:30 AM [facility] [phone]   02/26/2013 1:00 PM [facility] [id number]   02/26/2013 2:00 PM Tonny Bollman, MD [facility] [phone]   02/26/2013 2:30 PM Purcell Nails, MD [facility] [phone]   03/02/2013 8:00 AM Sherrie George, MD [facility] [phone]      Discharge Medications:   Medication List    STOP taking these medications       hydrochlorothiazide 12.5 MG capsule  Commonly known as:  MICROZIDE     magnesium oxide 400 MG tablet  Commonly known as:  MAG-OX  TAKE these medications       aspirin 81 MG EC tablet  Take 1 tablet (81 mg total) by mouth daily.     B Complex 50 Tabs  Take 1 tablet by mouth daily.     cholecalciferol 1000 UNITS tablet  Commonly known as:  VITAMIN D  Take 1,000 Units by mouth 2 (two) times daily.     digoxin 0.25 MG tablet  Commonly known as:  LANOXIN  Take 0.25 mg by mouth daily.     doxazosin 8 MG tablet  Commonly known as:  CARDURA  Take 8 mg by mouth at bedtime.     glipiZIDE 5 MG tablet  Commonly known as:  GLUCOTROL  Take 5 mg by mouth 2 (two) times daily before a meal.     lisinopril-hydrochlorothiazide 20-12.5 MG per tablet  Commonly known as:  PRINZIDE,ZESTORETIC  Take 1 tablet by mouth daily.     metFORMIN 1000 MG tablet  Commonly known as:  GLUCOPHAGE  Take 1,000 mg by mouth 2 (two) times daily with a meal.     metoprolol tartrate 25 MG tablet  Commonly known as:  LOPRESSOR  Take 12.5 mg by mouth 2 (two) times daily.     Potassium Gluconate 595 MG Caps  Take 595 mg by mouth 2 (two) times daily.     Selenium 200 MCG Caps  Take 200 mcg by mouth every morning.     simvastatin 40 MG tablet  Commonly known as:  ZOCOR  Take 40 mg by mouth at bedtime.     traMADol 50 MG tablet  Commonly known as:  ULTRAM  Take 1-2 tablets (50-100 mg total) by mouth every 4 (four) hours as needed for pain.     vitamin A 8000 UNIT capsule  Take 8,000 Units by mouth every morning.     vitamin B-12 1000  MCG tablet  Commonly known as:  CYANOCOBALAMIN  Take 1,000 mcg by mouth 2 (two) times daily.     vitamin C 500 MG tablet  Commonly known as:  ASCORBIC ACID  Take 500 mg by mouth every morning.     warfarin 7.5 MG tablet  Commonly known as:  COUMADIN  Take 7.5 mg by mouth daily.     zinc gluconate 50 MG tablet  Take 50 mg by mouth every morning.      The patient has been discharged on:   1.Beta Blocker:  Yes [ x  ]                              No   [   ]                              If No, reason:  2.Ace Inhibitor/ARB: Yes [  x ]                                     No  [    ]                                     If No, reason:  3.Statin:   Yes [  x ]  No  [   ]                  If No, reason:  4.Ecasa:  Yes  [  x ]                  No   [   ]                  If No, reason:  Follow Up Appointments:     Follow-up Information   Follow up with Tonny Bollman, MD. (Echo to be obtained on 02/26/2013 at 1:00 pm (office will call with details);Appointment is on 02/26/2013 at 2:00 pm)    Contact information:   [address] [place] [address] [phone]       Follow up with Purcell Nails, MD. (PA/LAT CXR to be taken (at [facility] which is in the same building as Dr. Orvan July office on 02/26/2013 at 1:30 pm;Appointment with Dr. Cornelius Moras is on 02/26/2013 at 2:30 pm)    Contact information:   [address] [employer] [address] [place] [address] [phone]       Follow up with Pamelia Hoit, MD. (Call for follow up 1-2 weeks regarding HGA1C pre op 7)    Contact information:   [address] [place] [address] [phone]       Follow up with [facility]. (PT and INR need to be drawn on Wedensday 01/27/2013 at 10:30 am)    Contact information:   [address] [place] [address] [phone]      Signed: Doree Fudge MPA-C 01/22/2013, 9:49 AM

## 2013-01-22 NOTE — Progress Notes (Signed)
[id number] Education completed with pt and family. Understanding voiced. Discussed diabetic diet. Discussed CRP 2. Pt declined and will walk on his own. Luetta Nutting RN BSN

## 2013-01-22 NOTE — Care Management Note (Signed)
    Page 1 of 1   01/22/2013     2:22:23 PM   CARE MANAGEMENT NOTE 01/22/2013  Patient:  [name], [name]   Account Number:  [id number]  Date Initiated:  01/22/2013  Documentation initiated by:  Kandas Oliveto  Subjective/Objective Assessment:   PT S/P TAVR ON 01/19/13.  PTA, PT INDEPENDENT, LIVES WITH SPOUSE.     Action/Plan:   WIFE TO PROVIDE CARE AT DC.  PT AMBULATING INDEPENDENTLY, NO HOME DME NEEDED.   Anticipated DC Date:  01/22/2013   Anticipated DC Plan:  HOME/SELF CARE      DC Planning Services  CM consult      Choice offered to / List presented to:             Status of service:  Completed, signed off Medicare Important Message given?   (If response is "NO", the following Medicare IM given date fields will be blank) Date Medicare IM given:   Date Additional Medicare IM given:    Discharge Disposition:  HOME/SELF CARE  Per UR Regulation:  Reviewed for med. necessity/level of care/duration of stay  If discussed at Long Length of Stay Meetings, dates discussed:    Comments:

## 2013-01-22 NOTE — Progress Notes (Signed)
    Subjective:  Feels good today. Post-op pain is minimal. Breathing improved.  Objective:  Vital Signs in the last 24 hours: Temp:  [97.5 F (36.4 C)-99.2 F (37.3 C)] 97.6 F (36.4 C) (03/21 0602) Pulse Rate:  [66-99] 66 (03/21 0602) Resp:  [18-25] 18 (03/21 0602) BP: (120-139)/(39-61) 123/41 mmHg (03/21 0602) SpO2:  [93 %-96 %] 96 % (03/21 0602) Weight:  [85.5 kg (188 lb 7.9 oz)] 85.5 kg (188 lb 7.9 oz) (03/21 0602)  Intake/Output from previous day: 03/20 0701 - 03/21 0700 In: 1400 [P.O.:1200; IV Piggyback:200] Out: 1105 [Urine:1105]  Physical Exam: Pt is alert and oriented, NAD HEENT: normal Neck: JVP - normal Lungs: CTA bilaterally CV: RRR with soft SEM at the RUSB Abd: soft, NT, Positive BS, no hepatomegaly Ext: mild pretibial edema bilaterally Skin: warm/dry no rash  Lab Results:  Recent Labs  01/21/13 0417 01/22/13 0505  WBC 9.1 6.0  HGB 10.2* 11.1*  PLT 67* 79*    Recent Labs  01/21/13 0417 01/22/13 0505  NA 130* 133*  K 3.4* 4.4  CL 96 98  CO2 25 26  GLUCOSE 97 98  BUN 27* 29*  CREATININE 1.18 1.24   No results found for this basename: TROPONINI, CK, MB,  in the last 72 hours  Cardiac Studies: Study Conclusions  - Left ventricle: Can not rule out a small area of hypokinesis at the apex. The cavity size was normal. Wall thickness was increased in a pattern of moderate to severe LVH. The estimated ejection fraction was 65%. Wall motion was normal; there were no regional wall motion abnormalities. - Aortic valve: The prosthetic valve is working well. No significant regurgitation. Valve area: 1.73cm^2(VTI). Valve area: 1.9cm^2 (Vmax). - Mitral valve: Calcified annulus. - Left atrium: The atrium was moderately dilated. - Right ventricle: The cavity size was mildly dilated. Systolic function was mildly reduced. - Right atrium: The atrium was mildly dilated. - Pulmonary arteries: PA peak pressure: 49mm Hg (S).  Tele: Atrial fib, heart  rate controlled  Assessment/Plan:  1. Aortic stenosis s/p transcatheter AVR - stable with low transvalvular gradient and minimal AI 2. Chronic diastolic CHF - doing well. BP borderline high on low-dose metoprolol. Could add back lisinopril/HCT, but would only take 1/2 of prior home dose (1 pill daily rather than 2). 3. Atrial fib - controlled on dig/metoprolol. Back on warfarin and low-dose ASA.  Tonny Bollman, M.D. 01/22/2013, 8:15 AM

## 2013-01-22 NOTE — Progress Notes (Signed)
Pt/family given discharge instructions, medication lists, follow up appointments, and when to call the doctor.  Pt/family verbalizes understanding. Pt given signs and symptoms of infection. Shamiah Kahler McClintock    

## 2013-01-27 ENCOUNTER — Other Ambulatory Visit: Payer: Self-pay | Admitting: *Deleted

## 2013-01-27 ENCOUNTER — Ambulatory Visit (INDEPENDENT_AMBULATORY_CARE_PROVIDER_SITE_OTHER): Payer: Medicare Other | Admitting: Pharmacist

## 2013-01-27 DIAGNOSIS — I4891 Unspecified atrial fibrillation: Secondary | ICD-10-CM

## 2013-01-27 DIAGNOSIS — Z79899 Other long term (current) drug therapy: Secondary | ICD-10-CM

## 2013-01-27 DIAGNOSIS — I359 Nonrheumatic aortic valve disorder, unspecified: Secondary | ICD-10-CM

## 2013-01-27 LAB — POCT INR: INR: 1.7

## 2013-01-27 MED ORDER — FUROSEMIDE 20 MG PO TABS: 20.0000 mg | ORAL_TABLET | Freq: Every day | ORAL | Status: DC

## 2013-01-27 NOTE — Progress Notes (Signed)
Pt complains of SOB, weight gain and swelling since being discharged from the hospital on 3/21.  Weight on 3/7 was 178lb.  Discharge weight on 3/21 was 188lb and today in office he is 191lbs.  His only diuretic is HCTZ and he states the dose was decreased after discharge.  BP is stable in office today.  Reviewed labs.  SCr was stable during hospitalization.  K was low on one occasion but normalized prior to discharge.  Discussed with Dr. [place] (DOD).  Plan to start furosemide 20mg  once daily until he can follow up with Dr. Denyse Amass. Cornelius Moras.  Spoke with Alycia Rossetti, RN with TAVR clinic.  She spoke with pt yesterday and was aware of SOB.  She had already discussed with his MDs and has set up an appt for him to see Dr. Cornelius Moras on Monday 3/31.

## 2013-02-01 ENCOUNTER — Ambulatory Visit (INDEPENDENT_AMBULATORY_CARE_PROVIDER_SITE_OTHER): Payer: Medicare Other | Admitting: Thoracic Surgery (Cardiothoracic Vascular Surgery)

## 2013-02-01 ENCOUNTER — Ambulatory Visit
Admission: RE | Admit: 2013-02-01 | Discharge: 2013-02-01 | Disposition: A | Discharge status: Home / Self Care | Payer: Medicare Other | Source: Ambulatory Visit | Attending: Thoracic Surgery (Cardiothoracic Vascular Surgery) | Admitting: Thoracic Surgery (Cardiothoracic Vascular Surgery)

## 2013-02-01 ENCOUNTER — Encounter: Payer: Self-pay | Admitting: Thoracic Surgery (Cardiothoracic Vascular Surgery)

## 2013-02-01 VITALS — BP 136/69 | HR 73 | Resp 20 | Ht 68.0 in | Wt 186.0 lb

## 2013-02-01 DIAGNOSIS — I350 Nonrheumatic aortic (valve) stenosis: Secondary | ICD-10-CM

## 2013-02-01 DIAGNOSIS — I359 Nonrheumatic aortic valve disorder, unspecified: Secondary | ICD-10-CM

## 2013-02-01 DIAGNOSIS — Z952 Presence of prosthetic heart valve: Secondary | ICD-10-CM | POA: Insufficient documentation

## 2013-02-01 DIAGNOSIS — Z954 Presence of other heart-valve replacement: Secondary | ICD-10-CM

## 2013-02-01 DIAGNOSIS — Z953 Presence of xenogenic heart valve: Secondary | ICD-10-CM

## 2013-02-01 NOTE — Patient Instructions (Signed)
Continue all current medications without change.  Continue to record your weight every day and keep a log.  The patient is encouraged to enroll and participate in the outpatient cardiac rehab program beginning as soon as practical.

## 2013-02-01 NOTE — Progress Notes (Signed)
[address]            [name] [address]          [phone]     CARDIOTHORACIC SURGERY OFFICE NOTE  Referring Provider is Tonny Bollman, MD PCP is Pamelia Hoit, MD   HPI:  Patient returns for followup status post transcatheter aortic valve replacement via transapical approach on 01/19/2013. His postoperative recovery has been uncomplicated. Last week when he went to get his Prothrombin Time checked he complained that his weight had been trending up and he felt some mild dyspnea with exertion.  He was restarted on Lasix at that time. He states that since then his weight has gone back down a couple of pounds and the swelling around his ankles has nearly completely resolved. He returns to our office today with primary complaint that he seems to have a salty taste in his mouth that bothers him.  His appetite is otherwise fairly good particularly in the evenings. He has mild exertional shortness of breath. He denies any resting shortness of breath. He has minimal pain in his chest and he is not taking any pain relievers. He states that the pain only bothersome if he coughs or moves suddenly. He does note that his blood sugars seem to be running a little bit higher than he did prior to surgery, and he blames this on the fact that he received supplemental insulin during his hospital stay.   Current Outpatient Prescriptions  Medication Sig Dispense Refill  . B Complex Vitamins (B COMPLEX 50) TABS Take 1 tablet by mouth daily.       . cholecalciferol (VITAMIN D) 1000 UNITS tablet Take 1,000 Units by mouth 2 (two) times daily.      . digoxin (LANOXIN) 0.25 MG tablet Take 0.25 mg by mouth daily.       Marland Kitchen doxazosin (CARDURA) 8 MG tablet Take 8 mg by mouth at bedtime.       . furosemide (LASIX) 20 MG tablet Take 1 tablet (20 mg total) by mouth daily.  30 tablet  0  . glipiZIDE (GLUCOTROL) 5 MG tablet Take 5 mg by mouth 2 (two) times daily before a meal.         . lisinopril-hydrochlorothiazide (PRINZIDE,ZESTORETIC) 20-12.5 MG per tablet Take 1 tablet by mouth daily.  30 tablet  1  . metFORMIN (GLUCOPHAGE) 1000 MG tablet Take 1,000 mg by mouth 2 (two) times daily with a meal.       . metoprolol tartrate (LOPRESSOR) 25 MG tablet Take 12.5 mg by mouth 2 (two) times daily.      . Potassium Gluconate 595 MG CAPS Take 595 mg by mouth 2 (two) times daily.      . Selenium 200 MCG CAPS Take 200 mcg by mouth every morning.       . simvastatin (ZOCOR) 40 MG tablet Take 40 mg by mouth at bedtime.        . traMADol (ULTRAM) 50 MG tablet Take 1-2 tablets (50-100 mg total) by mouth every 4 (four) hours as needed for pain.  30 tablet  0  . vitamin A 8000 UNIT capsule Take 8,000 Units by mouth every morning.      . vitamin B-12 (CYANOCOBALAMIN) 1000 MCG tablet Take 1,000 mcg by mouth 2 (two) times daily.      . vitamin C (ASCORBIC ACID) 500 MG tablet Take 500  mg by mouth every morning.       . warfarin (COUMADIN) 7.5 MG tablet Take 7.5 mg by mouth daily. 10 mg po mon/wed/fri and 7.5 mg other days      . zinc gluconate 50 MG tablet Take 50 mg by mouth every morning.        No current facility-administered medications for this visit.   Facility-Administered Medications Ordered in Other Visits  Medication Dose Route Frequency Provider Last Rate Last Dose  . norepinephrine (LEVOPHED) 8 mg in dextrose 5 % 250 mL infusion  2-50 mcg/min Intravenous Titrated Purcell Nails, MD          Physical Exam:   BP 136/69  Pulse 73  Resp 20  Ht 5\' 8"  (1.727 m)  Wt 186 lb (84.369 kg)  BMI 28.29 kg/m2  SpO2 92%  General:  Well-appearing  Chest:   Clear to auscultation with slightly diminished breath sounds at both lung bases  CV:   Irregular rate and rhythm. I do not appreciate a diastolic murmur  Incisions:  Clean and dry and healing nicely  Abdomen:  Soft and nontender  Extremities:  Warm and well-perfused with trace lower extremity edema  Diagnostic  Tests:  *RADIOLOGY REPORT*  Clinical Data: Chest pain shortness of breath.  CHEST - 2 VIEW  Comparison: 01/13/2013  Findings: Lungs are better aerated than on the previous study with  decreased interstitial markings. There is persistent retrocardiac  atelectasis with persistent small bilateral pleural effusions, left  greater than right. Imaged bony structures of the thorax are  intact.  IMPRESSION:  Slight decrease in vascular congestion. Otherwise no substantial  change.  Original Report Authenticated By: Kennith Center, M.D.    Impression:  Patient seems to be progressing reasonably well just 2 weeks following transcatheter aortic valve replacement.   Plan:  I've encouraged patient to continue to increase his activity as tolerated. I've strongly encourage him to enroll in the cardiac rehabilitation program. We have not made any changes to his current medications and in particular I think he should continue to take Lasix and potassium as prescribed. I've recommended that he continue to monitor his weight carefully. All of his questions been addressed. We will plan to see him back in 4 weeks in the multidisciplinary valve clinic.   Salvatore Decent. Cornelius Moras, MD 02/01/2013 4:02 PM

## 2013-02-26 ENCOUNTER — Ambulatory Visit (HOSPITAL_COMMUNITY)
Admission: RE | Admit: 2013-02-26 | Discharge: 2013-02-26 | Disposition: A | Discharge status: Home / Self Care | Payer: Medicare Other | Source: Ambulatory Visit | Attending: Thoracic Surgery (Cardiothoracic Vascular Surgery) | Admitting: Thoracic Surgery (Cardiothoracic Vascular Surgery)

## 2013-02-26 ENCOUNTER — Encounter (HOSPITAL_COMMUNITY): Payer: Medicare Other | Admitting: Thoracic Surgery (Cardiothoracic Vascular Surgery)

## 2013-02-26 ENCOUNTER — Encounter (HOSPITAL_COMMUNITY): Payer: Medicare Other | Admitting: Cardiovascular Disease

## 2013-02-26 ENCOUNTER — Encounter (HOSPITAL_COMMUNITY): Payer: Self-pay | Admitting: Thoracic Surgery (Cardiothoracic Vascular Surgery)

## 2013-02-26 ENCOUNTER — Ambulatory Visit (HOSPITAL_COMMUNITY)
Admission: RE | Admit: 2013-02-26 | Discharge: 2013-02-26 | Disposition: A | Discharge status: Home / Self Care | Payer: Medicare Other | Source: Ambulatory Visit | Attending: Cardiovascular Disease | Admitting: Cardiovascular Disease

## 2013-02-26 ENCOUNTER — Encounter (HOSPITAL_COMMUNITY): Payer: Self-pay | Admitting: Cardiovascular Disease

## 2013-02-26 ENCOUNTER — Ambulatory Visit (HOSPITAL_COMMUNITY)
Admit: 2013-02-26 | Discharge: 2013-02-26 | Disposition: A | Discharge status: Home / Self Care | Payer: Medicare Other | Attending: Cardiovascular Disease | Admitting: Cardiovascular Disease

## 2013-02-26 VITALS — BP 180/52 | HR 93 | Resp 19 | Ht 68.0 in | Wt 174.8 lb

## 2013-02-26 DIAGNOSIS — I4891 Unspecified atrial fibrillation: Secondary | ICD-10-CM

## 2013-02-26 DIAGNOSIS — Z954 Presence of other heart-valve replacement: Secondary | ICD-10-CM | POA: Insufficient documentation

## 2013-02-26 DIAGNOSIS — I379 Nonrheumatic pulmonary valve disorder, unspecified: Secondary | ICD-10-CM | POA: Insufficient documentation

## 2013-02-26 DIAGNOSIS — Z953 Presence of xenogenic heart valve: Secondary | ICD-10-CM

## 2013-02-26 DIAGNOSIS — I369 Nonrheumatic tricuspid valve disorder, unspecified: Secondary | ICD-10-CM

## 2013-02-26 DIAGNOSIS — I059 Rheumatic mitral valve disease, unspecified: Secondary | ICD-10-CM | POA: Insufficient documentation

## 2013-02-26 DIAGNOSIS — I359 Nonrheumatic aortic valve disorder, unspecified: Secondary | ICD-10-CM

## 2013-02-26 DIAGNOSIS — Z952 Presence of prosthetic heart valve: Secondary | ICD-10-CM

## 2013-02-26 NOTE — Progress Notes (Signed)
[facility]   MULTIDISCIPLINARY HEART VALVE CLINIC       CARDIOTHORACIC SURGERY NOTE  Referring Provider is Aaron Bollman, MD Primary Cardiologist is Aaron Bunting, MD PCP is Aaron Hoit, MD   HPI:  Patient returns for followup status post transcatheter aortic valve replacement via transapical approach on 01/19/2013. His postoperative recovery has been uncomplicated.  I last saw him on the office on 02/01/2013. Since then he has continued to do well. He reports that his breathing and excise tolerance is considerably better than it was prior to surgery. He still complains of intermittently having a "salt taste" in his mouth. He has been evaluated by his primary care physician and reportedly has been referred to a gastroenterologist. He has minimal soreness in his chest. His activity level is quite good. Overall he has no complaints.    Current Outpatient Prescriptions  Medication Sig Dispense Refill  . B Complex Vitamins (B COMPLEX 50) TABS Take 1 tablet by mouth daily.       . cholecalciferol (VITAMIN D) 1000 UNITS tablet Take 1,000 Units by mouth 2 (two) times daily.      . digoxin (LANOXIN) 0.25 MG tablet Take 0.25 mg by mouth daily.       Marland Kitchen doxazosin (CARDURA) 8 MG tablet Take 8 mg by mouth at bedtime.       . furosemide (LASIX) 20 MG tablet Take 1 tablet (20 mg total) by mouth daily.  30 tablet  0  . glipiZIDE (GLUCOTROL) 5 MG tablet Take 5 mg by mouth 2 (two) times daily before a meal.       . hydrochlorothiazide (MICROZIDE) 12.5 MG capsule       . lisinopril-hydrochlorothiazide (PRINZIDE,ZESTORETIC) 20-12.5 MG per tablet Take 1 tablet by mouth daily.  30 tablet  1  . metFORMIN (GLUCOPHAGE) 1000 MG tablet Take 1,000 mg by mouth 2 (two) times daily with a meal.       . metoprolol tartrate (LOPRESSOR) 25 MG tablet Take 12.5 mg by mouth 2 (two) times daily.      . Potassium Gluconate 595 MG CAPS Take 595 mg by mouth 2 (two) times daily.      . Selenium 200 MCG  CAPS Take 200 mcg by mouth every morning.       . simvastatin (ZOCOR) 40 MG tablet Take 40 mg by mouth at bedtime.        . traMADol (ULTRAM) 50 MG tablet Take 1-2 tablets (50-100 mg total) by mouth every 4 (four) hours as needed for pain.  30 tablet  0  . vitamin A 8000 UNIT capsule Take 8,000 Units by mouth every morning.      . vitamin B-12 (CYANOCOBALAMIN) 1000 MCG tablet Take 1,000 mcg by mouth 2 (two) times daily.      . vitamin C (ASCORBIC ACID) 500 MG tablet Take 500 mg by mouth every morning.       . warfarin (COUMADIN) 7.5 MG tablet Take 7.5 mg by mouth daily. 10 mg po mon/wed/fri and 7.5 mg other days      . zinc gluconate 50 MG tablet Take 50 mg by mouth every morning.        No current facility-administered medications for this encounter.   Facility-Administered Medications Ordered in Other Encounters  Medication Dose Route Frequency Provider Last Rate Last Dose  . norepinephrine (LEVOPHED) 8 mg in dextrose 5 % 250 mL infusion  2-50 mcg/min Intravenous Titrated Purcell Nails, MD  Physical Exam:   BP 180/52  Pulse 93  Resp 19  Ht 5\' 8"  (1.727 m)  Wt 174 lb 12.8 oz (79.289 kg)  BMI 26.58 kg/m2  SpO2 93%  General:  Well-appearing  Chest:   Clear to auscultation with symmetrical breath sounds  CV:   Irregular rate and rhythm  Incisions:  Healing nicely  Abdomen:  Soft and nontender  Extremities:  Warm and well-perfused  Diagnostic Tests:  Transthoracic Echocardiography  Patient: Aaron, [name] MR #: [id number] Study Date: 02/26/2013 Gender: M Age: 79 Height: 172.7cm Weight: 84.5kg BSA: 2.70m^2 Pt. Status: Room:  PERFORMING [place] SONOGRAPHER Perley Jain, RDCS ATTENDING Karlene Lineman, Randa Evens cc:  ------------------------------------------------------------ LV EF: 55% - 60%  ------------------------------------------------------------ Indications: Aortic stenosis  424.1.  ------------------------------------------------------------ History: PMH: 30 Day post TAVR. Risk factors: Hypertension. Diabetes mellitus. Dyslipidemia.  ------------------------------------------------------------ Study Conclusions  - Left ventricle: The cavity size was normal. Wall thickness was normal. Systolic function was normal. The estimated ejection fraction was in the range of 55% to 60%. Wall motion was normal; there were no regional wall motion abnormalities. The study is not technically sufficient to allow evaluation of LV diastolic function. - Aortic valve: A bioprosthesis was present. Valve area: 1.92cm^2(VTI). Valve area: 2.18cm^2 (Vmax). - Mitral valve: Calcified annulus. Mildly thickened leaflets . Mild regurgitation. - Left atrium: The atrium was moderately dilated. - Right atrium: The atrium was mildly to moderately dilated. - Pulmonary arteries: Systolic pressure was moderately increased. PA peak pressure: 68mm Hg (S). Impressions:  - Well seated AVR s/p TAVR; normal gradient (mean gradient 8 mmHg); no AI. Transthoracic echocardiography. M-mode, complete 2D, spectral Doppler, and color Doppler. Height: Height: 172.7cm. Height: 68in. Weight: Weight: 84.5kg. Weight: 186lb. Body mass index: BMI: 28.3kg/m^2. Body surface area: BSA: 2.50m^2. Blood pressure: 145/55. Patient status: Outpatient. Location: Echo laboratory.  ------------------------------------------------------------  ------------------------------------------------------------ Left ventricle: The cavity size was normal. Wall thickness was normal. Systolic function was normal. The estimated ejection fraction was in the range of 55% to 60%. Wall motion was normal; there were no regional wall motion abnormalities. The study is not technically sufficient to allow evaluation of LV diastolic function.  ------------------------------------------------------------ Aortic valve: A  bioprosthesis was present. Doppler: Transvalvular velocity was within the normal range. There was no stenosis. No regurgitation. VTI ratio of LVOT to aortic valve: 0.55. Valve area: 1.92cm^2(VTI). Indexed valve area: 0.94cm^2/m^2 (VTI). Peak velocity ratio of LVOT to aortic valve: 0.63. Valve area: 2.18cm^2 (Vmax). Indexed valve area: 1.07cm^2/m^2 (Vmax). Mean gradient: 8mm Hg (S). Peak gradient: 16mm Hg (S).  ------------------------------------------------------------ Aorta: Aortic root: The aortic root was normal in size.  ------------------------------------------------------------ Mitral valve: Calcified annulus. Mildly thickened leaflets . Mobility was not restricted. Doppler: Transvalvular velocity was within the normal range. There was no evidence for stenosis. Mild regurgitation. Peak gradient: 5mm Hg (D).  ------------------------------------------------------------ Left atrium: The atrium was moderately dilated.  ------------------------------------------------------------ Right ventricle: The cavity size was normal. Systolic function was normal.  ------------------------------------------------------------ Pulmonic valve: Doppler: Transvalvular velocity was within the normal range. There was no evidence for stenosis. Trivial regurgitation.  ------------------------------------------------------------ Tricuspid valve: Structurally normal valve. Doppler: Transvalvular velocity was within the normal range. Mild regurgitation.  ------------------------------------------------------------ Pulmonary artery: Systolic pressure was moderately increased.  ------------------------------------------------------------ Right atrium: The atrium was mildly to moderately dilated.  ------------------------------------------------------------ Pericardium: There was no pericardial effusion.  ------------------------------------------------------------ Systemic veins: Inferior  vena cava: The vessel was dilated.  ------------------------------------------------------------  2D measurements Normal Doppler measurements Normal Left ventricle Main pulmonary LVID  ED, 51 mm 43-52 artery chord, Pressure, 68 mm Hg =30 PLAX S LVID ES, 33 mm 23-38 LVOT chord, Peak vel, 124 cm/s ------ PLAX S FS, chord, 35 % >29 VTI, S 20.7 cm ------ PLAX Peak 6 mm Hg ------ LVPW, ED 11 mm ------ gradient, IVS/LVPW 0.91 <1.3 S ratio, ED Aortic valve Ventricular septum Peak vel, 197 cm/s ------ IVS, ED 10 mm ------ S LVOT Mean vel, 133 cm/s ------ Diam, S 21 mm ------ S Area 3.46 cm^2 ------ VTI, S 37.3 cm ------ Aorta Mean 8 mm Hg ------ Root diam, 31 mm ------ gradient, ED S Left atrium Peak 16 mm Hg ------ AP dim 48 mm ------ gradient, AP dim 2.36 cm/m^2 <2.2 S index VTI ratio 0.55 ------ LVOT/AV Area, VTI 1.92 cm^2 ------ Area index 0.94 cm^2/m ------ (VTI) ^2 Peak vel 0.63 ------ ratio, LVOT/AV Area, Vmax 2.18 cm^2 ------ Area index 1.07 cm^2/m ------ (Vmax) ^2 Mitral valve Peak E vel 114 cm/s ------ Decelerati 208 ms 150-23 on time 0 Peak 5 mm Hg ------ gradient, D Max regurg 612 cm/s ------ vel Regurg VTI 180 cm ------ Tricuspid valve Regurg 364 cm/s ------ peak vel Peak RV-RA 53 mm Hg ------ gradient, S Systemic veins Estimated 15 mm Hg ------ CVP Right ventricle Pressure, 68 mm Hg <30 S Sa vel, 13.6 cm/s ------ lat ann, tiss DP  ------------------------------------------------------------ Prepared and Electronically Authenticated by  Olga Millers 2014-04-25T14:[id number]    CXR:  Chest x-ray performed today at [facility] is reviewed. The official report is not yet available. Both lung fields are clear. There is trivial left basilar atelectasis. There are no pleural effusions.   Impression:  Patient is doing exceptionally well one month status post transcatheter aortic valve replacement via transapical approach.  Echocardiogram performed today looks remarkably good with no significant aortic insufficiency and a remarkably low transvalvular gradient.  Left ventricular systolic function looks good.   Plan:  The patient will continue to followup with Dr. Andrey Campanile and Dr. Ladona Ridgel for his long-term medical followup.  At this point he is released to unrestricted physical activity. We will plan to see him back in 6 months just to make sure that he continues to get a long uneventfully, and he will need a followup echocardiogram performed in March of 2015.   Aaron Decent. Cornelius Moras, MD 02/26/2013 2:29 PM

## 2013-02-26 NOTE — Progress Notes (Signed)
MULTIDISCIPLINARY HEART VALVE CLINIC NOTE  Patient ID: STEVENS MAGWOOD MRN: [id number] DOB/AGE: 1933/07/10 79 y.o.  Primary Care Physician:WILSON,FRED Sherilyn Cooter, MD Primary Cardiologist: Lewayne Bunting, MD  HPI: 79 year-old male presenting for follow-up evaluation. He underwent transapical TAVR January 19, 2013 with an uncomplicated postoperative course. He feels well, but thinks he 'overdid it' yesterday. He noticed his heart rate was elevated last night. He denies chest pain, chest pressure, exertional dyspnea, edema, orthopnea, or PND. He has been compliant with his medications and reports no bleeding problems on warfarin.  Past Medical History  Diagnosis Date  . Aortic stenosis      moderate to severe with valve area 0.95  . HTN (hypertension)   . Bradycardia   . Hyperlipemia 12/21/2012  . Anticoagulated on Coumadin 12/21/2012  . Anemia     LOW PLATELETS OTHER DAY  PER PT  . S/P aortic valve replacement with bioprosthetic valve 01/19/2013    Transcatheter Aortic Valve Replacement using 26mm Sapien bioprosthetic tissue valve via transapical approach  . Prostate cancer     "had 40 tx of radiation in 2009" (01/21/2013)  . Heart murmur     "I've had it for years; runs in the family on daddy's side" (01/21/2013)  . Atrial fibrillation     [name], DR Adabelle Griffiths  . Exertional shortness of breath   . Type II diabetes mellitus   . Arthritis     "left wrist; back sometimes" (01/21/2013)    Past Surgical History  Procedure Laterality Date  . Transthoracic echocardiogram  09/04/10, 09/07/08  . Cataract extraction w/ intraocular lens  implant, bilateral    . Eye surgery    . Intraoperative transesophageal echocardiogram N/A 01/19/2013    Procedure: INTRAOPERATIVE TRANSESOPHAGEAL ECHOCARDIOGRAM;  Surgeon: Purcell Nails, MD;  Location: [facility] OR;  Service: Open Heart Surgery;  Laterality: N/A;  . Cardiac valve replacement  01/19/2013    AVR  . Orif forearm fracture Left 1954    "compound fx"  (01/21/2013)  . Cardiac catheterization  12/16/2012    Family History  Problem Relation Age of Onset  . Diabetes Mother   . Cancer Mother 44    bone,melanoma  . Cancer Father 50    pancreatic  . Heart disease Father   . Stroke Sister   . Cancer Brother 20    ? brain cancer    History   Social History  . Marital Status: Married    Spouse Name: N/A    Number of Children: N/A  . Years of Education: N/A   Occupational History  . Not on file.   Social History Main Topics  . Smoking status: Former Smoker -- 2.00 packs/day for 22 years    Types: Cigarettes    Quit date: 09/11/1976  . Smokeless tobacco: Never Used  . Alcohol Use: No  . Drug Use: No  . Sexually Active: No   Other Topics Concern  . Not on file   Social History Narrative  . No narrative on file    B Complex Vitamins (B COMPLEX 50) TABS  Take 1 tablet by mouth daily.  .  cholecalciferol (VITAMIN D) 1000 UNITS tablet  Take 1,000 Units by mouth 2 (two) times daily.  .  digoxin (LANOXIN) 0.25 MG tablet  Take 0.25 mg by mouth daily.  Marland Kitchen  doxazosin (CARDURA) 8 MG tablet  Take 8 mg by mouth at bedtime.  .  furosemide (LASIX) 20 MG tablet  Take 1 tablet (20 mg total) by mouth  daily.  30 tablet  0  .  glipiZIDE (GLUCOTROL) 5 MG tablet  Take 5 mg by mouth 2 (two) times daily before a meal.  .  hydrochlorothiazide (MICROZIDE) 12.5 MG capsule  .  lisinopril-hydrochlorothiazide (PRINZIDE,ZESTORETIC) 20-12.5 MG per tablet  Take 1 tablet by mouth daily.  30 tablet  1  .  metFORMIN (GLUCOPHAGE) 1000 MG tablet  Take 1,000 mg by mouth 2 (two) times daily with a meal.  .  metoprolol tartrate (LOPRESSOR) 25 MG tablet  Take 12.5 mg by mouth 2 (two) times daily.  .  Potassium Gluconate 595 MG CAPS  Take 595 mg by mouth 2 (two) times daily.  .  Selenium 200 MCG CAPS  Take 200 mcg by mouth every morning.  .  simvastatin (ZOCOR) 40 MG tablet  Take 40 mg by mouth at bedtime.  .  traMADol (ULTRAM) 50 MG  tablet  Take 1-2 tablets (50-100 mg total) by mouth every 4 (four) hours as needed for pain.  30 tablet  0  .  vitamin A 8000 UNIT capsule  Take 8,000 Units by mouth every morning.  .  vitamin B-12 (CYANOCOBALAMIN) 1000 MCG tablet  Take 1,000 mcg by mouth 2 (two) times daily.  .  vitamin C (ASCORBIC ACID) 500 MG tablet  Take 500 mg by mouth every morning.  .  warfarin (COUMADIN) 7.5 MG tablet  Take 7.5 mg by mouth daily. 10 mg po mon/wed/fri and 7.5 mg other days  .  zinc gluconate 50 MG tablet  Take 50 mg by mouth every morning.     No Known Allergies  ROS:  General: no fevers/chills/night sweats Eyes: no blurry vision, diplopia, or amaurosis ENT: no sore throat or hearing loss Resp: no cough, wheezing, or hemoptysis CV: no edema or palpitations GI: no abdominal pain, nausea, vomiting, diarrhea, or constipation GU: no dysuria, frequency, or hematuria Skin: no rash Neuro: no headache, numbness, tingling, or weakness of extremities Musculoskeletal: no joint pain or swelling Heme: no bleeding, DVT, or easy bruising Endo: no polydipsia or polyuria  BP 180/52  Pulse 93  Resp 19  Ht 5\' 8"  (1.727 m)  Wt 174 lb 12.8 oz (79.289 kg)  BMI 26.58 kg/m2  SpO2 93%  PHYSICAL EXAM: Pt is alert and oriented, WD, WN, in no distress. HEENT: normal Neck: JVP normal. Carotid upstrokes normal. No thyromegaly. Lungs: equal expansion, clear bilaterally CV: Apex is discrete and nondisplaced, irregularly irregular with a soft systolic ejection murmur at the LSB Abd: soft, NT, +BS, no bruit, no hepatosplenomegaly Back: no CVA tenderness Ext: no C/C/E        Femoral pulses 2+= without bruits        DP/PT pulses intact and = Skin: warm and dry without rash Neuro: CNII-XII intact             Strength intact = bilaterally  2D ECHO (02/26/2013 - 30 day study): Study Conclusions  - Left ventricle: The cavity size was normal. Wall thickness was normal. Systolic function was normal. The  estimated ejection fraction was in the range of 55% to 60%. Wall motion was normal; there were no regional wall motion abnormalities. The study is not technically sufficient to allow evaluation of LV diastolic function. - Aortic valve: A bioprosthesis was present. Valve area: 1.92cm^2(VTI). Valve area: 2.18cm^2 (Vmax). - Mitral valve: Calcified annulus. Mildly thickened leaflets . Mild regurgitation. - Left atrium: The atrium was moderately dilated. - Right atrium: The atrium was mildly to moderately dilated. -  Pulmonary arteries: Systolic pressure was moderately increased. PA peak pressure: 68mm Hg (S). Impressions:  - Well seated AVR s/p TAVR; normal gradient (mean gradient 8 mmHg); no AI.  ASSESSMENT AND PLAN:  1. Aortic valve disease s/p TAVR. He is doing remarkably well with NYHA Class 1 symptoms. His echo was reviewed and shows normal function of his aortic valve prosthesis with no aortic insufficiency. We will see him back in 6 months for follow-up evaluation.  2. Atrial fibrillation, permanent. Heart rate is controlled on digoxin and metoprolol and he remains anticoagulated on warfarin. He remains stable.  3. HTN - his home BP's are normal. Continue current Rx.  Tonny Bollman 02/26/2013 2:50 PM

## 2013-02-27 ENCOUNTER — Encounter (HOSPITAL_COMMUNITY): Payer: Self-pay | Admitting: Cardiovascular Disease

## 2013-03-02 ENCOUNTER — Ambulatory Visit (INDEPENDENT_AMBULATORY_CARE_PROVIDER_SITE_OTHER): Payer: Medicare Other | Admitting: Ophthalmology

## 2013-03-02 DIAGNOSIS — H43819 Vitreous degeneration, unspecified eye: Secondary | ICD-10-CM

## 2013-03-02 DIAGNOSIS — E1139 Type 2 diabetes mellitus with other diabetic ophthalmic complication: Secondary | ICD-10-CM

## 2013-03-02 DIAGNOSIS — E11319 Type 2 diabetes mellitus with unspecified diabetic retinopathy without macular edema: Secondary | ICD-10-CM

## 2013-03-02 DIAGNOSIS — H35379 Puckering of macula, unspecified eye: Secondary | ICD-10-CM

## 2013-03-02 DIAGNOSIS — H35039 Hypertensive retinopathy, unspecified eye: Secondary | ICD-10-CM

## 2013-03-02 DIAGNOSIS — I10 Essential (primary) hypertension: Secondary | ICD-10-CM

## 2013-06-06 IMAGING — CR DG CHEST 2V
2 series · 2 of 2 positions shown · non-contrast
Comparison: 01/13/2013

CLINICAL DATA: Chest pain shortness of breath.

CHEST - 2 VIEW

[w chest pa]
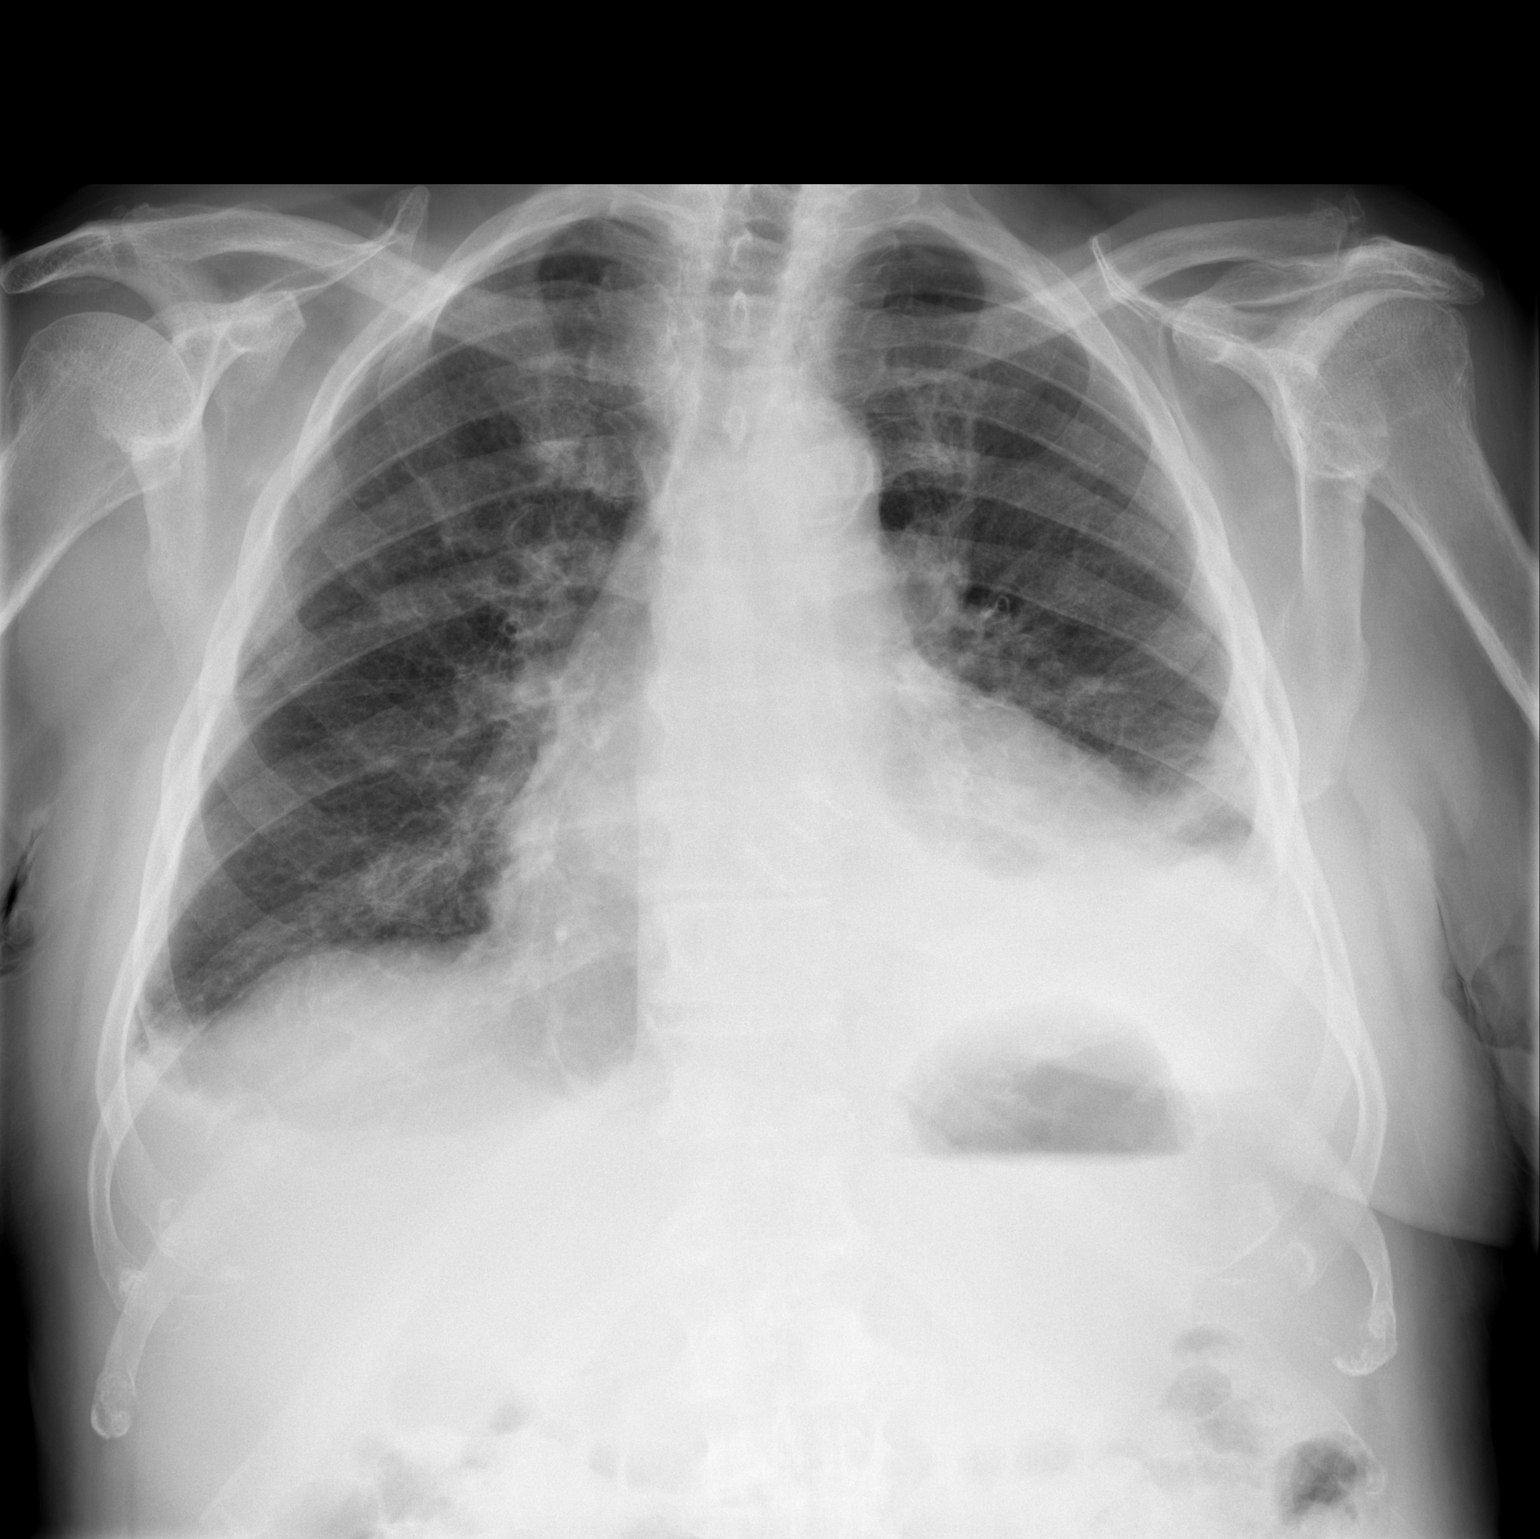

[w chest lat]
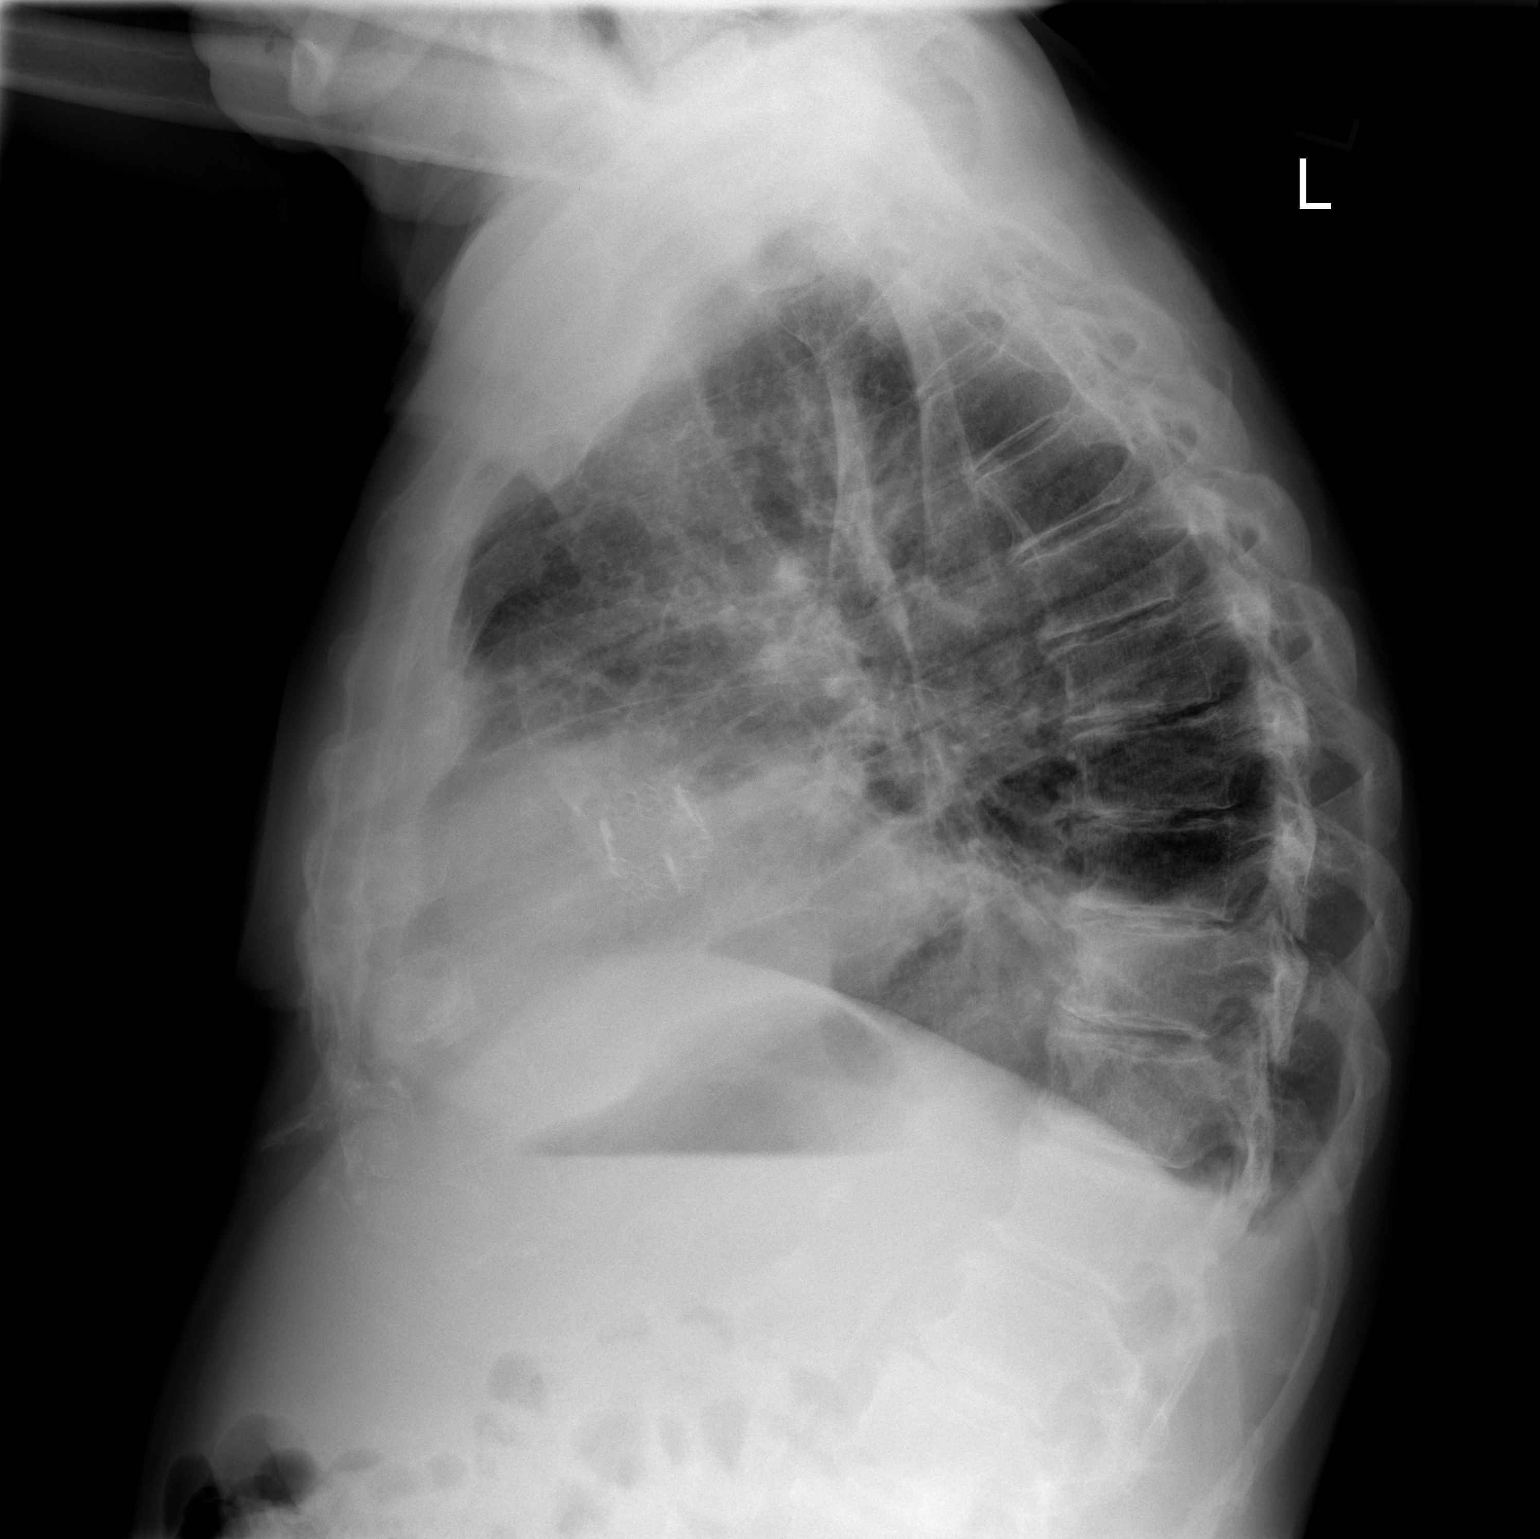

[2 of 2 positions shown; findings below may reference images not displayed]

FINDINGS: Lungs are better aerated than on the previous study with
decreased interstitial markings.  There is persistent retrocardiac
atelectasis with persistent small bilateral pleural effusions, left
greater than right. Imaged bony structures of the thorax are
intact.
IMPRESSION: Slight decrease in vascular congestion.  Otherwise no substantial
change.

## 2013-08-23 ENCOUNTER — Other Ambulatory Visit: Payer: Self-pay | Admitting: Internal Medicine

## 2013-08-27 ENCOUNTER — Encounter: Payer: Self-pay | Admitting: Cardiovascular Disease

## 2013-08-27 ENCOUNTER — Ambulatory Visit (INDEPENDENT_AMBULATORY_CARE_PROVIDER_SITE_OTHER): Payer: Medicare Other | Admitting: Cardiovascular Disease

## 2013-08-27 ENCOUNTER — Encounter (HOSPITAL_COMMUNITY): Payer: Medicare Other | Admitting: Thoracic Surgery (Cardiothoracic Vascular Surgery)

## 2013-08-27 ENCOUNTER — Encounter (HOSPITAL_COMMUNITY): Payer: Medicare Other | Admitting: Cardiovascular Disease

## 2013-08-27 VITALS — BP 138/64 | HR 56 | Ht 68.0 in | Wt 178.0 lb

## 2013-08-27 DIAGNOSIS — I359 Nonrheumatic aortic valve disorder, unspecified: Secondary | ICD-10-CM

## 2013-08-27 NOTE — Progress Notes (Signed)
HPI:  79 year old gentleman presenting for followup evaluation. He underwent transapical TAVR in March 2014 with an uncomplicated postoperative course. He presents today for six-month followup. Overall he is doing well. He complains of generalized fatigue and exercise intolerance with walking. He denies shortness of breath and is able to work in his yard without limitation. He denies chest pain, lightheadedness, syncope, orthopnea, or PND. He does have leg swelling that he relates to some of his antihypertensive medications. He has previously been on testosterone replacement and felt better at that time. He is considering restarting this. He denies bleeding problems and is maintained on long-term warfarin.  Outpatient Encounter Prescriptions as of 08/27/2013  Medication Sig Dispense Refill  . amLODipine (NORVASC) 5 MG tablet Take 5 mg by mouth daily. One tab      . B Complex Vitamins (B COMPLEX 50) TABS Take 1 tablet by mouth daily.       . cholecalciferol (VITAMIN D) 1000 UNITS tablet Take 1,000 Units by mouth 2 (two) times daily.      . digoxin (LANOXIN) 0.25 MG tablet Take 0.25 mg by mouth daily.       Marland Kitchen doxazosin (CARDURA) 8 MG tablet Take 8 mg by mouth at bedtime.       Marland Kitchen glipiZIDE (GLUCOTROL) 5 MG tablet Take 5 mg by mouth 2 (two) times daily before a meal.       . lisinopril-hydrochlorothiazide (PRINZIDE,ZESTORETIC) 20-12.5 MG per tablet Take 2 tablets by mouth daily.      . metFORMIN (GLUCOPHAGE) 1000 MG tablet Take 1,000 mg by mouth 2 (two) times daily with a meal.       . metoprolol tartrate (LOPRESSOR) 25 MG tablet Take 12.5 mg by mouth 2 (two) times daily.      . metoprolol tartrate (LOPRESSOR) 25 MG tablet TAKE ONE-HALF TABLET BY MOUTH TWICE DAILY  90 tablet  0  . Potassium Gluconate 595 MG CAPS Take 595 mg by mouth 2 (two) times daily.      . Selenium 200 MCG CAPS Take 200 mcg by mouth every morning.       . simvastatin (ZOCOR) 40 MG tablet Take 40 mg by mouth at bedtime.          . traMADol (ULTRAM) 50 MG tablet Take 1-2 tablets (50-100 mg total) by mouth every 4 (four) hours as needed for pain.  30 tablet  0  . vitamin A 8000 UNIT capsule Take 8,000 Units by mouth every morning.      . vitamin B-12 (CYANOCOBALAMIN) 1000 MCG tablet Take 1,000 mcg by mouth 2 (two) times daily.      . vitamin C (ASCORBIC ACID) 500 MG tablet Take 500 mg by mouth every morning.       . warfarin (COUMADIN) 7.5 MG tablet Take 7.5 mg by mouth daily. 10 mg po mon/wed/fri and 7.5 mg other days      . zinc gluconate 50 MG tablet Take 50 mg by mouth every morning.       . [DISCONTINUED] furosemide (LASIX) 20 MG tablet Take 1 tablet (20 mg total) by mouth daily.  30 tablet  0  . [DISCONTINUED] hydrochlorothiazide (MICROZIDE) 12.5 MG capsule       . [DISCONTINUED] lisinopril-hydrochlorothiazide (PRINZIDE,ZESTORETIC) 20-12.5 MG per tablet Take 1 tablet by mouth daily.  30 tablet  1   Facility-Administered Encounter Medications as of 08/27/2013  Medication Dose Route Frequency Provider Last Rate Last Dose  . norepinephrine (LEVOPHED) 8 mg in dextrose 5 %  250 mL infusion  2-50 mcg/min Intravenous Titrated Purcell Nails, MD        No Known Allergies  Past Medical History  Diagnosis Date  . Aortic stenosis      moderate to severe with valve area 0.95  . HTN (hypertension)   . Bradycardia   . Hyperlipemia 12/21/2012  . Anticoagulated on Coumadin 12/21/2012  . Anemia     LOW PLATELETS OTHER DAY  PER PT  . S/P aortic valve replacement with bioprosthetic valve 01/19/2013    Transcatheter Aortic Valve Replacement using 26mm Sapien bioprosthetic tissue valve via transapical approach  . Prostate cancer     "had 40 tx of radiation in 2009" (01/21/2013)  . Heart murmur     "I've had it for years; runs in the family on daddy's side" (01/21/2013)  . Atrial fibrillation     [name], DR Daishia Fetterly  . Exertional shortness of breath   . Type II diabetes mellitus   . Arthritis     "left wrist; back  sometimes" (01/21/2013)    ROS: Negative except as per HPI  BP 138/64  Pulse 56  Ht 5\' 8"  (1.727 m)  Wt 178 lb (80.74 kg)  BMI 27.07 kg/m2  PHYSICAL EXAM: Pt is alert and oriented, pleasant elderly male in NAD HEENT: normal Neck: JVP - normal, carotids 2+= without bruits Lungs: CTA bilaterally CV: Irregularly irregular without murmur or gallop Abd: soft, NT, Positive BS, no hepatomegaly Ext: 1+ right lower extremity edema, trace left lower extremity edema, distal pulses intact and equal Skin: warm/dry no rash  EKG:  Atrial fibrillation 56 beats per minute, right bundle branch block, left anterior fascicular block.  ASSESSMENT AND PLAN: 1. Aortic valve disease status post TAVR. The patient is stable. His 30 day echocardiogram demonstrated normal valve function and this will be repeated at one year. His exam suggests normal valve function with no evidence of a systolic or diastolic murmur. He will be seen back in the multidisciplinary valve clinic at one year and should continue his regular cardiology followup with Dr. Ladona Ridgel.  2. Permanent atrial fibrillation. Treated with the strategy of rate control and anticoagulation.  3. Hypertension. Blood pressure is controlled on metoprolol and amlodipine.  Tonny Bollman 08/27/2013 3:26 PM

## 2013-08-27 NOTE — Patient Instructions (Addendum)
Your physician recommends that you schedule a follow-up appointment in: 6 MONTHS for office visit with Dr Excell Seltzer and Echocardiogram (TAVR clinic)  Your physician recommends that you continue on your current medications as directed. Please refer to the Current Medication list given to you today.

## 2013-09-01 ENCOUNTER — Other Ambulatory Visit: Payer: Self-pay | Admitting: Internal Medicine

## 2013-11-30 ENCOUNTER — Encounter (HOSPITAL_BASED_OUTPATIENT_CLINIC_OR_DEPARTMENT_OTHER): Payer: Medicare Other | Attending: General Surgery

## 2013-11-30 DIAGNOSIS — L8992 Pressure ulcer of unspecified site, stage 2: Secondary | ICD-10-CM | POA: Insufficient documentation

## 2013-11-30 DIAGNOSIS — L89109 Pressure ulcer of unspecified part of back, unspecified stage: Secondary | ICD-10-CM | POA: Insufficient documentation

## 2013-11-30 LAB — GLUCOSE, CAPILLARY: GLUCOSE-CAPILLARY: 170 mg/dL — AB (ref 70–99)

## 2013-12-01 NOTE — H&P (Signed)
NAME:  [name], [name] NO.:  [id number]  MEDICAL RECORD NO.:  [id number]  LOCATION:  FOOT                         FACILITY:  [facility]  PHYSICIAN:  Elesa Hacker, M.D.        DATE OF BIRTH:  Nov 22, 1932  DATE OF ADMISSION:  11/30/2013 DATE OF DISCHARGE:                             HISTORY & PHYSICAL   CHIEF COMPLAINT:  Sore in presacral area.  HISTORY OF PRESENT ILLNESS:  This patient developed a sore in his gluteal cleft, approximately 2 years ago, has been treated with various ointments and antibiotics with no result.  He has occasional pain when sitting on it for a long period of time.  He is ambulatory.  He is diabetic.  PAST MEDICAL HISTORY:  Also significant for coronary artery disease, prostate cancer, hypertension, aortic stenosis.  MEDICATIONS:  Amlodipine, vitamin B12, digoxin, Cardura, glipizide, lisinopril, metformin, metoprolol, selenium, simvastatin, tramadol, Coumadin, and zinc.  ALLERGIES:  None.  SOCIAL HISTORY:  Cigarettes none.  Alcohol none.  PAST SURGICAL HISTORY:  Cataract surgery, valve replacement in 2014, radiation therapy for cancer of the prostate and surgical repair of a left arm fracture.  PHYSICAL EXAMINATION:  VITAL SIGNS:  Temp 98, pulse 80 and irregular, respirations 16, blood pressure 160/67. GENERAL APPEARANCE:  Well developed, well nourished. CHEST:  Clear. HEART:  Regular rhythm.  I do not hear a murmur.  Glucose is 170.  Examination of the presacral area reveals a wound in the gluteal cleft 0.9 x 0.4.  It is relatively superficial and is covered with a thick yellow slough.  IMPRESSION:  Possibly decubitus ulcer stage II in the gluteal cleft.  We will start treating with triple antibiotic ointment after I have debrided the wound and with 4x4s to keep the buttocks apart.  I will see him in 7 days.     Elesa Hacker, M.D.     RA/MEDQ  D:  11/30/2013  T:  12/01/2013  Job:  [id number]

## 2013-12-04 ENCOUNTER — Other Ambulatory Visit: Payer: Self-pay | Admitting: Internal Medicine

## 2013-12-07 ENCOUNTER — Encounter (HOSPITAL_BASED_OUTPATIENT_CLINIC_OR_DEPARTMENT_OTHER): Payer: Medicare Other | Attending: General Surgery

## 2013-12-07 DIAGNOSIS — L8992 Pressure ulcer of unspecified site, stage 2: Secondary | ICD-10-CM | POA: Insufficient documentation

## 2013-12-07 DIAGNOSIS — L89109 Pressure ulcer of unspecified part of back, unspecified stage: Secondary | ICD-10-CM | POA: Insufficient documentation

## 2013-12-22 ENCOUNTER — Other Ambulatory Visit: Payer: Self-pay | Admitting: *Deleted

## 2013-12-22 DIAGNOSIS — I359 Nonrheumatic aortic valve disorder, unspecified: Secondary | ICD-10-CM

## 2014-01-04 ENCOUNTER — Encounter (HOSPITAL_BASED_OUTPATIENT_CLINIC_OR_DEPARTMENT_OTHER): Payer: Medicare Other | Attending: General Surgery

## 2014-01-21 ENCOUNTER — Encounter (HOSPITAL_COMMUNITY): Payer: Self-pay | Admitting: Cardiovascular Disease

## 2014-01-21 ENCOUNTER — Encounter (HOSPITAL_COMMUNITY): Payer: Self-pay | Admitting: Thoracic Surgery (Cardiothoracic Vascular Surgery)

## 2014-01-21 ENCOUNTER — Ambulatory Visit (HOSPITAL_COMMUNITY)
Admission: RE | Admit: 2014-01-21 | Discharge: 2014-01-21 | Disposition: A | Discharge status: Home / Self Care | Payer: Medicare Other | Source: Ambulatory Visit | Attending: Cardiovascular Disease | Admitting: Cardiovascular Disease

## 2014-01-21 ENCOUNTER — Ambulatory Visit (HOSPITAL_COMMUNITY)
Admission: RE | Admit: 2014-01-21 | Discharge: 2014-01-21 | Disposition: A | Discharge status: Home / Self Care | Payer: Medicare Other | Source: Ambulatory Visit | Attending: Family Medicine | Admitting: Family Medicine

## 2014-01-21 ENCOUNTER — Ambulatory Visit (HOSPITAL_BASED_OUTPATIENT_CLINIC_OR_DEPARTMENT_OTHER)
Admission: RE | Admit: 2014-01-21 | Discharge: 2014-01-21 | Disposition: A | Discharge status: Home / Self Care | Payer: Medicare Other | Source: Ambulatory Visit | Attending: Thoracic Surgery (Cardiothoracic Vascular Surgery) | Admitting: Thoracic Surgery (Cardiothoracic Vascular Surgery)

## 2014-01-21 VITALS — BP 148/72 | HR 75 | Resp 19 | Ht 68.5 in | Wt 185.0 lb

## 2014-01-21 DIAGNOSIS — I359 Nonrheumatic aortic valve disorder, unspecified: Secondary | ICD-10-CM

## 2014-01-21 DIAGNOSIS — I059 Rheumatic mitral valve disease, unspecified: Secondary | ICD-10-CM

## 2014-01-21 NOTE — Progress Notes (Signed)
  Echocardiogram 2D Echocardiogram has been performed.  [place], [place] 01/21/2014, 1:30 PM

## 2014-01-21 NOTE — Patient Instructions (Signed)

## 2014-01-21 NOTE — Progress Notes (Signed)
MULTIDISCIPLINARY HEART VALVE CLINIC NOTE  Patient ID: [name] MRN: [id number] DOB/AGE: 78-Apr-1934 78 y.o.  Primary Care Physician:MORROW, [name], MD Primary Cardiologist: Dr Lovena Le  HPI: 80 year old gentleman returns for one-year followup after undergoing TAVR March 2014 [facility] transapical approach. His postoperative course was uncomplicated.  The patient feels very well at present. He denies breathlessness, chest discomfort, lightheadedness, or syncope. He is tolerating warfarin without bleeding problems. He's been less active over the winter months, but reports no limitations.  He's had some elevated blood pressure readings at home and will followup with his primary physician next week regarding this.  Past Medical History  Diagnosis Date  . Aortic stenosis   . HTN (hypertension)   . Bradycardia   . Hyperlipemia 12/21/2012  . Anticoagulated on Coumadin 12/21/2012  . Anemia     LOW PLATELETS OTHER DAY  PER PT  . S/P aortic valve replacement with bioprosthetic valve 01/19/2013    Transcatheter Aortic Valve Replacement using 59mm Sapien bioprosthetic tissue valve via transapical approach  . Prostate cancer     "had 40 tx of radiation in 2009" (01/21/2013)  . Heart murmur     "I've had it for years; runs in the family on daddy's side" (01/21/2013)  . Atrial fibrillation     [name], DR Magic Mohler  . Exertional shortness of breath   . Type II diabetes mellitus   . Arthritis     "left wrist; back sometimes" (01/21/2013)    Past Surgical History  Procedure Laterality Date  . Transthoracic echocardiogram  09/04/10, 09/07/08  . Cataract extraction w/ intraocular lens  implant, bilateral    . Eye surgery    . Intraoperative transesophageal echocardiogram N/A 01/19/2013    Procedure: INTRAOPERATIVE TRANSESOPHAGEAL ECHOCARDIOGRAM;  Surgeon: [name], MD;  Location: [facility];  Service: Open Heart Surgery;  Laterality: N/A;  . Cardiac valve replacement  01/19/2013    AVR  . Orif  forearm fracture Left 1954    "compound fx" (01/21/2013)  . Cardiac catheterization  12/16/2012    Family History  Problem Relation Age of Onset  . Diabetes Mother   . Cancer Mother 9    bone,melanoma  . Cancer Father 88    pancreatic  . Heart disease Father   . Stroke Sister   . Cancer Brother 20    ? brain cancer    History   Social History  . Marital Status: Married    Spouse Name: N/A    Number of Children: N/A  . Years of Education: N/A   Occupational History  . Not on file.   Social History Main Topics  . Smoking status: Former Smoker -- 2.00 packs/day for 22 years    Types: Cigarettes    Quit date: 09/11/1976  . Smokeless tobacco: Never Used  . Alcohol Use: No  . Drug Use: No  . Sexual Activity: No   Other Topics Concern  . Not on file   Social History Narrative  . No narrative on file     Prior to Admission medications   Medication Sig Start Date End Date Taking? Authorizing Provider  amLODipine (NORVASC) 5 MG tablet Take 5 mg by mouth daily. One tab 08/23/13  Yes Historical Provider, MD  B Complex Vitamins (B COMPLEX 50) TABS Take 1 tablet by mouth daily.    Yes Historical Provider, MD  cholecalciferol (VITAMIN D) 1000 UNITS tablet Take 1,000 Units by mouth 2 (two) times daily.   Yes Historical Provider, MD  digoxin (  LANOXIN) 0.25 MG tablet Take 0.25 mg by mouth daily.    Yes Historical Provider, MD  doxazosin (CARDURA) 8 MG tablet Take 8 mg by mouth at bedtime.    Yes Historical Provider, MD  glipiZIDE (GLUCOTROL) 5 MG tablet Take 5 mg by mouth 2 (two) times daily before a meal.    Yes Historical Provider, MD  lisinopril-hydrochlorothiazide (PRINZIDE,ZESTORETIC) 20-12.5 MG per tablet Take 2 tablets by mouth daily. 01/22/13  Yes Donielle Liston [place]ba, PA-C  metFORMIN (GLUCOPHAGE) 1000 MG tablet Take 1,000 mg by mouth 2 (two) times daily with a meal.    Yes Historical Provider, MD  metoprolol tartrate (LOPRESSOR) 25 MG tablet Take 12.5 mg by mouth 2 (two)  times daily.   Yes Historical Provider, MD  Potassium Gluconate 595 MG CAPS Take 595 mg by mouth 2 (two) times daily.   Yes Historical Provider, MD  Selenium 200 MCG CAPS Take 200 mcg by mouth every morning.    Yes Historical Provider, MD  simvastatin (ZOCOR) 40 MG tablet Take 40 mg by mouth at bedtime.     Yes Historical Provider, MD  vitamin A 8000 UNIT capsule Take 8,000 Units by mouth every morning.   Yes Historical Provider, MD  vitamin B-12 (CYANOCOBALAMIN) 1000 MCG tablet Take 1,000 mcg by mouth 2 (two) times daily.   Yes Historical Provider, MD  vitamin C (ASCORBIC ACID) 500 MG tablet Take 500 mg by mouth every morning.    Yes Historical Provider, MD  warfarin (COUMADIN) 7.5 MG tablet Take 7.5 mg by mouth daily. 10 mg po mon/wed/fri and 7.5 mg other days   Yes Historical Provider, MD  zinc gluconate 50 MG tablet Take 50 mg by mouth every morning.    Yes Historical Provider, MD    No Known Allergies   BP 148/72  Pulse 75  Resp 19  Ht 5' 8.5" (1.74 m)  Wt 185 lb (83.915 kg)  BMI 27.72 kg/m2  SpO2 95%  PHYSICAL EXAM: Pt is alert and oriented, WD, WN, in no distress. HEENT: normal Neck: JVP normal. Carotid upstrokes normal  Lungs: equal expansion, clear bilaterally CV: Apex is discrete and nondisplaced,  Irregularly irregular with a soft systolic ejection murmur at the left sternal border, grade 1/6  Abd: soft, NT, +BS, no bruit, no hepatosplenomegaly Back: no CVA tenderness Ext: 1+ pretibial and edema on the right with mild stasis dermatitis, no edema on the left DP/PT pulses intact and = Skin: warm and dry without rash   ASSESSMENT AND PLAN:  80 year old gentleman with history of severe calcific aortic stenosis now one year out from TAVR. He is doing exceptionally well with no cardiopulmonary limitation. He is New York Heart Association functional class I. His echo images were reviewed and there is no evidence of perivalvular aortic insufficiency. History his valvular  gradients are within expected limits. LV function is normal. The official interpretation is currently pending.  The patient will continue his regular cardiology followup with Dr. Lovena Le. We would be happy to see him in the future if any problems arise. Will arrange an annual echocardiogram to follow his prosthetic valve function.  Sherren Mocha 01/21/2014 2:41 PM

## 2014-01-21 NOTE — Addendum Note (Signed)
Encounter addended by: [name], MD on: 01/21/2014  7:29 PM<BR>     Documentation filed: Clinical Notes

## 2014-01-21 NOTE — Addendum Note (Signed)
Encounter addended by: [name], MD on: 01/21/2014  7:04 PM<BR>     Documentation filed: Notes Section

## 2014-01-21 NOTE — Progress Notes (Addendum)
[facility]       CARDIOTHORACIC SURGERY NOTE  Referring Provider is Sherren Mocha, MD PCP is [name], MD   HPI:  Patient returns for followup status post transcatheter aortic valve replacement via transapical approach on 01/19/2013. His postoperative recovery was uncomplicated. I last saw him on the valve clinic on 02/26/2013.  He was seen by Dr. Burt Knack on 08/27/2013 at which time he was still doing well.  He returns to the valve clinic today for routine follow up 1 year after his procedure.  He reports feeling remarkably well, much improved in comparison with how he felt before his procedure.  He denies any symptoms of exertional shortness of breath.  He has no physical limitations.     Current Outpatient Prescriptions  Medication Sig Dispense Refill  . amLODipine (NORVASC) 5 MG tablet Take 5 mg by mouth daily. One tab      . B Complex Vitamins (B COMPLEX 50) TABS Take 1 tablet by mouth daily.       . cholecalciferol (VITAMIN D) 1000 UNITS tablet Take 1,000 Units by mouth 2 (two) times daily.      . digoxin (LANOXIN) 0.25 MG tablet Take 0.25 mg by mouth daily.       Marland Kitchen doxazosin (CARDURA) 8 MG tablet Take 8 mg by mouth at bedtime.       Marland Kitchen glipiZIDE (GLUCOTROL) 5 MG tablet Take 5 mg by mouth 2 (two) times daily before a meal.       . lisinopril-hydrochlorothiazide (PRINZIDE,ZESTORETIC) 20-12.5 MG per tablet Take 2 tablets by mouth daily.      . metFORMIN (GLUCOPHAGE) 1000 MG tablet Take 1,000 mg by mouth 2 (two) times daily with a meal.       . metoprolol tartrate (LOPRESSOR) 25 MG tablet Take 12.5 mg by mouth 2 (two) times daily.      . Potassium Gluconate 595 MG CAPS Take 595 mg by mouth 2 (two) times daily.      . Selenium 200 MCG CAPS Take 200 mcg by mouth every morning.       . simvastatin (ZOCOR) 40 MG tablet Take 40 mg by mouth at bedtime.        . vitamin A 8000 UNIT capsule Take 8,000 Units by mouth every morning.       . vitamin B-12 (CYANOCOBALAMIN) 1000 MCG tablet Take 1,000 mcg by mouth 2 (two) times daily.      . vitamin C (ASCORBIC ACID) 500 MG tablet Take 500 mg by mouth every morning.       . warfarin (COUMADIN) 7.5 MG tablet Take 7.5 mg by mouth daily. 10 mg po mon/wed/fri and 7.5 mg other days      . zinc gluconate 50 MG tablet Take 50 mg by mouth every morning.        No current facility-administered medications for this encounter.   Facility-Administered Medications Ordered in Other Encounters  Medication Dose Route Frequency Provider Last Rate Last Dose  . norepinephrine (LEVOPHED) 8 mg in dextrose 5 % 250 mL infusion  2-50 mcg/min Intravenous Titrated [name], MD          Physical Exam:   BP 148/72  Pulse 75  Resp 19  Ht 5' 8.5" (1.74 m)  Wt 185 lb (83.915 kg)  BMI 27.72 kg/m2  SpO2 95%  General:  Well appearing  Chest:   clear  CV:   RRR w/ soft systolic murmur  at RSB  Incisions:  healed  Abdomen:  soft  Extremities:  warm  Diagnostic Tests:  Transthoracic Echocardiography  Patient: Calan, Doren MR #: [id number] Study Date: 01/21/2014 Gender: M Age: 80 Height: 172.7cm Weight: 80.7kg BSA: 1.30m^2 Pt. Status: Room:  Coletta Memos, Gala Lewandowsky SONOGRAPHER Wyatt Mage, RDCS ATTENDING [place], [place], Outpatient cc:  ------------------------------------------------------------ LV EF: 55% - 60%  ------------------------------------------------------------ History: PMH: Aortic valve disorder - 424.1 Atrial fibrillation. Risk factors: S/p TAVR. H/o prostate cancer. Former tobacco use. Hypertension. Diabetes mellitus. Dyslipidemia.  ------------------------------------------------------------ Study Conclusions  - Left ventricle: The cavity size was normal. Wall thickness was increased in a pattern of moderate LVH. Systolic function was normal. The estimated ejection fraction was in  the range of 55% to 60%. Wall motion was normal; there were no regional wall motion abnormalities. - Aortic valve: A bioprosthesis was present. - Mitral valve: Calcified annulus. Mild regurgitation. - Left atrium: The atrium was moderately dilated. - Right ventricle: The cavity size was mildly to moderately dilated. Systolic function was mildly to moderately reduced. - Right atrium: The atrium was moderately dilated. - Pulmonary arteries: Systolic pressure was moderately increased. PA peak pressure: 82mm Hg (S). Transthoracic echocardiography. M-mode, complete 2D, spectral Doppler, and color Doppler. Height: Height: 172.7cm. Height: 68in. Weight: Weight: 80.7kg. Weight: 177.6lb. Body mass index: BMI: 27.1kg/m^2. Body surface area: BSA: 1.51m^2. Blood pressure: 138/64. Patient status: Outpatient. Location: Echo laboratory.  ------------------------------------------------------------  ------------------------------------------------------------ Left ventricle: The cavity size was normal. Wall thickness was increased in a pattern of moderate LVH. Systolic function was normal. The estimated ejection fraction was in the range of 55% to 60%. Wall motion was normal; there were no regional wall motion abnormalities.  ------------------------------------------------------------ Aortic valve: s/p TAVR. No AS. No AI. A bioprosthesis was present. Doppler: There was no stenosis. No significant regurgitation. Peak velocity ratio of LVOT to aortic valve: 0.53. Mean gradient: 61mm Hg (S). Peak gradient: 24mm Hg (S).  ------------------------------------------------------------ Aorta: Aortic root: The aortic root was normal in size. Ascending aorta: The ascending aorta was normal in size.  ------------------------------------------------------------ Mitral valve: Calcified annulus. Doppler: Mild regurgitation.  ------------------------------------------------------------ Left atrium: The  atrium was moderately dilated.  ------------------------------------------------------------ Right ventricle: The cavity size was mildly to moderately dilated. Systolic function was mildly to moderately reduced.  ------------------------------------------------------------ Pulmonic valve: Structurally normal valve. Cusp separation was normal. Doppler: Transvalvular velocity was within the normal range. Mild regurgitation.  ------------------------------------------------------------ Tricuspid valve: Structurally normal valve. Leaflet separation was normal. Doppler: Transvalvular velocity was within the normal range. Trivial regurgitation.  ------------------------------------------------------------ Pulmonary artery: Systolic pressure was moderately increased.  ------------------------------------------------------------ Right atrium: The atrium was moderately dilated.  ------------------------------------------------------------ Pericardium: There was no pericardial effusion.  ------------------------------------------------------------  2D measurements Normal Doppler measurements Normal Left ventricle Main pulmonary LVID ED, 47.9 mm 43-52 artery chord, Pressure, S 61 mm =30 PLAX Hg LVID ES, 35.3 mm 23-38 Pressure ED 13 mm ------ chord, Hg PLAX LVOT FS, chord, 26 % >29 Peak vel, S 134 cm/s ------ PLAX Peak 7 mm ------ LVPW, ED 15.3 mm ------ gradient, S Hg IVS/LVPW 1.05 <1.3 Aortic valve ratio, ED Peak vel, S 253 cm/s ------ Ventricular septum Mean vel, S 166 cm/s ------ IVS, ED 16.1 mm ------ VTI, S 56.8 cm ------ Aorta Mean 13 mm ------ Root diam, 31 mm ------ gradient, S Hg ED Peak 26 mm ------ Left atrium gradient, S Hg AP dim 56 mm ------ Peak vel 0.53 ------ AP dim 2.87 cm/m^2 <2.2 ratio, index  LVOT/AV Vol, S 101 ml ------ Tricuspid valve Vol index, 51.8 ml/m^2 ------ Regurg peak 363 cm/s ------ S vel Peak RV-RA 53 mm ------ gradient, S Hg Max regurg 363  cm/s ------ vel Systemic veins Estimated 8 mm ------ CVP Hg Right ventricle Pressure, S 61 mm <30 Hg Sa vel, lat 16.5 cm/s ------ ann, tiss DP Pulmonic valve Regurg vel, 117 cm/s ------ ED  ------------------------------------------------------------ Prepared and Electronically Authenticated by  Mertie Moores 2015-03-20T15:16:[id number]    Impression:  Patient is doing exceptionally well 1 year s/p TAVR via transapical approach.  He has no symptoms of exertional SOB, essentially NYHA functional class I.  Late followup echocardiogram looks good with normal valve function and no significant aortic insufficiency.   Plan:  We recommend annual follow-up echocardiograms to monitor his valve prosthesis.  He has been reminded regarding the need for antibiotic prophylaxis for all dental procedures or other procedures at risk of causing bacteremia.  We will see him back in the multidisciplinary valve clinic only should further problems or questions arise.   Valentina Gu. Roxy Manns, MD 01/21/2014 2:19 PM

## 2014-01-21 NOTE — Patient Instructions (Signed)
You will continue to follow up with Dr. Lovena Le.  You will hear from [place] (Dr. Jeronimo Greaves. Roxy Manns) in 1 year to schedule another echo.

## 2014-03-01 ENCOUNTER — Ambulatory Visit (INDEPENDENT_AMBULATORY_CARE_PROVIDER_SITE_OTHER): Payer: Medicare Other | Admitting: Internal Medicine

## 2014-03-01 ENCOUNTER — Encounter: Payer: Self-pay | Admitting: Internal Medicine

## 2014-03-01 VITALS — BP 164/60 | HR 48 | Ht 68.5 in | Wt 174.2 lb

## 2014-03-01 DIAGNOSIS — I10 Essential (primary) hypertension: Secondary | ICD-10-CM

## 2014-03-01 DIAGNOSIS — I4891 Unspecified atrial fibrillation: Secondary | ICD-10-CM

## 2014-03-01 MED ORDER — CARVEDILOL 12.5 MG PO TABS: 12.5000 mg | ORAL_TABLET | Freq: Two times a day (BID) | ORAL | Status: DC

## 2014-03-01 NOTE — Patient Instructions (Signed)
Your physician wants you to follow-up in: 6 months withDr Knox Saliva will receive a reminder letter in the mail two months in advance. If you don't receive a letter, please call our office to schedule the follow-up appointment.   Your physician recommends that you schedule a follow-up appointment in: 2 weeks in nurse room for BP and EKG   Your physician has recommended you make the following change in your medication:  1) Stop Digoxin 2) Start Carvedilol 12.5mg  twice daily

## 2014-03-01 NOTE — Progress Notes (Signed)
HPI [name] returns today after a long absence from our EP clinic. He is a pleasant 80 yo man with atrial fib, aortic stenosis, s/p TAVR. He has done well and his valve is reportedly working normally. He has had some trouble with his blood pressure. No dyspnea. No palpitations and no syncope. No Known Allergies   Current Outpatient Prescriptions  Medication Sig Dispense Refill  . amLODipine (NORVASC) 5 MG tablet Take 5 mg by mouth daily. One tab      . B Complex Vitamins (B COMPLEX 50) TABS Take 1 tablet by mouth daily.       . cholecalciferol (VITAMIN D) 1000 UNITS tablet Take 1,000 Units by mouth 2 (two) times daily.      . digoxin (LANOXIN) 0.25 MG tablet Take 0.25 mg by mouth daily.       Marland Kitchen doxazosin (CARDURA) 8 MG tablet Take 8 mg by mouth at bedtime.       Marland Kitchen glipiZIDE (GLUCOTROL) 5 MG tablet Take 5 mg by mouth 2 (two) times daily before a meal.       . lisinopril-hydrochlorothiazide (PRINZIDE,ZESTORETIC) 20-12.5 MG per tablet Take 2 tablets by mouth daily.      . metFORMIN (GLUCOPHAGE) 1000 MG tablet Take 1,000 mg by mouth 2 (two) times daily with a meal.       . metoprolol tartrate (LOPRESSOR) 25 MG tablet Take 12.5 mg by mouth 2 (two) times daily.      . Potassium Gluconate 595 MG CAPS Take 595 mg by mouth 2 (two) times daily.      . Selenium 200 MCG CAPS Take 200 mcg by mouth every morning.       . simvastatin (ZOCOR) 40 MG tablet Take 40 mg by mouth at bedtime.        . vitamin A 8000 UNIT capsule Take 8,000 Units by mouth every morning.      . vitamin B-12 (CYANOCOBALAMIN) 1000 MCG tablet Take 1,000 mcg by mouth 2 (two) times daily.      . vitamin C (ASCORBIC ACID) 500 MG tablet Take 500 mg by mouth every morning.       . warfarin (COUMADIN) 7.5 MG tablet Take 7.5 mg by mouth daily. 10 mg po mon/wed/fri and 7.5 mg other days      . zinc gluconate 50 MG tablet Take 50 mg by mouth every morning.        No current facility-administered medications for this visit.    Facility-Administered Medications Ordered in Other Visits  Medication Dose Route Frequency Provider Last Rate Last Dose  . norepinephrine (LEVOPHED) 8 mg in dextrose 5 % 250 mL infusion  2-50 mcg/min Intravenous Titrated [name], MD         Past Medical History  Diagnosis Date  . Aortic stenosis   . HTN (hypertension)   . Bradycardia   . Hyperlipemia 12/21/2012  . Anticoagulated on Coumadin 12/21/2012  . Anemia     LOW PLATELETS OTHER DAY  PER PT  . S/P aortic valve replacement with bioprosthetic valve 01/19/2013    Transcatheter Aortic Valve Replacement using 47mm Sapien bioprosthetic tissue valve via transapical approach  . Prostate cancer     "had 40 tx of radiation in 2009" (01/21/2013)  . Heart murmur     "I've had it for years; runs in the family on daddy's side" (01/21/2013)  . Atrial fibrillation     GREG Ebenezer Mccaskey, DR COOPER  . Exertional shortness of  breath   . Type II diabetes mellitus   . Arthritis     "left wrist; back sometimes" (01/21/2013)    ROS:   All systems reviewed and negative except as noted in the HPI.   Past Surgical History  Procedure Laterality Date  . Transthoracic echocardiogram  09/04/10, 09/07/08  . Cataract extraction w/ intraocular lens  implant, bilateral    . Eye surgery    . Intraoperative transesophageal echocardiogram N/A 01/19/2013    Procedure: INTRAOPERATIVE TRANSESOPHAGEAL ECHOCARDIOGRAM;  Surgeon: [name], MD;  Location: [place];  Service: Open Heart Surgery;  Laterality: N/A;  . Cardiac valve replacement  01/19/2013    AVR  . Orif forearm fracture Left 1954    "compound fx" (01/21/2013)  . Cardiac catheterization  12/16/2012     Family History  Problem Relation Age of Onset  . Diabetes Mother   . Cancer Mother 57    bone,melanoma  . Cancer Father 93    pancreatic  . Heart disease Father   . Stroke Sister   . Cancer Brother 20    ? brain cancer     History   Social History  . Marital Status: Married     Spouse Name: N/A    Number of Children: N/A  . Years of Education: N/A   Occupational History  . Not on file.   Social History Main Topics  . Smoking status: Former Smoker -- 2.00 packs/day for 22 years    Types: Cigarettes    Quit date: 09/11/1976  . Smokeless tobacco: Never Used  . Alcohol Use: No  . Drug Use: No  . Sexual Activity: No   Other Topics Concern  . Not on file   Social History Narrative  . No narrative on file     BP 164/60  Pulse 48  Ht 5' 8.5" (1.74 m)  Wt 174 lb 3.2 oz (79.017 kg)  BMI 26.10 kg/m2  Physical Exam:  Well appearing elderly man, NAD HEENT: Unremarkable Neck:  No JVD, no thyromegally Back:  No CVA tenderness Lungs:  Clear with no wheezes HEART:  IRegular brady rhythm, no murmurs, no rubs, no clicks Abd:  soft, positive bowel sounds, no organomegally, no rebound, no guarding Ext:  2 plus pulses, no edema, no cyanosis, no clubbing Skin:  No rashes no nodules Neuro:  CN II through XII intact, motor grossly intact  EKG -  Atrial fibrillation with a slow VR   Assess/Plan:

## 2014-03-01 NOTE — Assessment & Plan Note (Signed)
His ventricular rate is actually too slow. He will stop his digoxin and his metoprolol and start coreg 12.5 mg twice daily. He will return in 2 weeks for a ECG and blood pressure check.

## 2014-03-01 NOTE — Assessment & Plan Note (Signed)
Hopefully his blood pressure will improve with coreg. He is encouraged to reduce his sodium intake.

## 2014-03-03 ENCOUNTER — Ambulatory Visit (INDEPENDENT_AMBULATORY_CARE_PROVIDER_SITE_OTHER): Payer: Medicare Other | Admitting: Ophthalmology

## 2014-03-03 ENCOUNTER — Other Ambulatory Visit: Payer: Self-pay | Admitting: Cardiovascular Disease

## 2014-03-03 DIAGNOSIS — E1139 Type 2 diabetes mellitus with other diabetic ophthalmic complication: Secondary | ICD-10-CM

## 2014-03-03 DIAGNOSIS — E11319 Type 2 diabetes mellitus with unspecified diabetic retinopathy without macular edema: Secondary | ICD-10-CM

## 2014-03-03 DIAGNOSIS — H26499 Other secondary cataract, unspecified eye: Secondary | ICD-10-CM

## 2014-03-03 DIAGNOSIS — H35039 Hypertensive retinopathy, unspecified eye: Secondary | ICD-10-CM

## 2014-03-03 DIAGNOSIS — I10 Essential (primary) hypertension: Secondary | ICD-10-CM

## 2014-03-03 DIAGNOSIS — E1165 Type 2 diabetes mellitus with hyperglycemia: Secondary | ICD-10-CM

## 2014-03-03 DIAGNOSIS — H35379 Puckering of macula, unspecified eye: Secondary | ICD-10-CM

## 2014-03-03 DIAGNOSIS — H43819 Vitreous degeneration, unspecified eye: Secondary | ICD-10-CM

## 2014-03-08 ENCOUNTER — Other Ambulatory Visit: Payer: Self-pay | Admitting: Cardiovascular Disease

## 2014-03-10 ENCOUNTER — Ambulatory Visit (INDEPENDENT_AMBULATORY_CARE_PROVIDER_SITE_OTHER): Payer: Medicare Other | Admitting: Ophthalmology

## 2014-03-10 DIAGNOSIS — E1139 Type 2 diabetes mellitus with other diabetic ophthalmic complication: Secondary | ICD-10-CM

## 2014-03-10 DIAGNOSIS — H2700 Aphakia, unspecified eye: Secondary | ICD-10-CM

## 2014-03-10 DIAGNOSIS — E1165 Type 2 diabetes mellitus with hyperglycemia: Secondary | ICD-10-CM

## 2014-03-10 DIAGNOSIS — E11319 Type 2 diabetes mellitus with unspecified diabetic retinopathy without macular edema: Secondary | ICD-10-CM

## 2014-03-15 ENCOUNTER — Ambulatory Visit (INDEPENDENT_AMBULATORY_CARE_PROVIDER_SITE_OTHER): Payer: Medicare Other | Admitting: *Deleted

## 2014-03-15 VITALS — BP 148/64 | HR 54 | Resp 18 | Wt 177.8 lb

## 2014-03-15 DIAGNOSIS — I498 Other specified cardiac arrhythmias: Secondary | ICD-10-CM

## 2014-03-15 DIAGNOSIS — I4891 Unspecified atrial fibrillation: Secondary | ICD-10-CM

## 2014-03-15 DIAGNOSIS — R001 Bradycardia, unspecified: Secondary | ICD-10-CM

## 2014-03-15 NOTE — Progress Notes (Signed)
1.) Reason for visit: BP CHECK / EKG  2.) Name of MD requesting visit: Dr. Lovena Le  3.) H&P: last OV 4/28--BP was 164/60, digoxin was dc'd; coreg 12.5 mg bid was started.  4.) ROS related to problem: Pt feels like he has more energy.  BP 148/64; HR 54. --PT ALSO IS TAKING METOPROLOL 12.5MG  BID  He brings a record of daily BPs/HR which run average of 120s-140s / 60s, HR 50-60s.  5.) Assessment and plan per MD: STOP METOPROLOL; RETURN IN A WEEK FOR BP CHECK WITH NURSE

## 2014-03-15 NOTE — Patient Instructions (Addendum)
Your physician has recommended you make the following change in your medication:  STOP TAKING METOPROLOL  Your physician recommends that you schedule a follow-up appointment in: [place].

## 2014-03-22 ENCOUNTER — Encounter (INDEPENDENT_AMBULATORY_CARE_PROVIDER_SITE_OTHER): Payer: Self-pay

## 2014-03-22 ENCOUNTER — Ambulatory Visit (INDEPENDENT_AMBULATORY_CARE_PROVIDER_SITE_OTHER): Payer: Medicare Other | Admitting: *Deleted

## 2014-03-22 VITALS — BP 138/64 | HR 62 | Wt 173.0 lb

## 2014-03-22 DIAGNOSIS — I10 Essential (primary) hypertension: Secondary | ICD-10-CM

## 2014-03-22 NOTE — Progress Notes (Signed)
Patient here for BP check only s/p medication change last week.  Current BP 138/64, HR 62.  Patient states he does feel better. He has been keeping daily BP readings, as follows: 5/14 -   141/54, 59  130/65, 68 5/15 -  144/62, 54  138/51, 57 5/16 -  141/61, 80  144/58, 81 5/17 - 145/65, 73  126/52, 75 5/18 -  148/57, 59  137,57, 77 5/19 -  132/57, 60

## 2014-08-30 ENCOUNTER — Ambulatory Visit (INDEPENDENT_AMBULATORY_CARE_PROVIDER_SITE_OTHER): Payer: Medicare Other | Admitting: Internal Medicine

## 2014-08-30 ENCOUNTER — Encounter: Payer: Self-pay | Admitting: Internal Medicine

## 2014-08-30 VITALS — BP 160/78 | HR 80 | Ht 68.5 in | Wt 181.8 lb

## 2014-08-30 DIAGNOSIS — I482 Chronic atrial fibrillation, unspecified: Secondary | ICD-10-CM

## 2014-08-30 DIAGNOSIS — I10 Essential (primary) hypertension: Secondary | ICD-10-CM

## 2014-08-30 DIAGNOSIS — I5032 Chronic diastolic (congestive) heart failure: Secondary | ICD-10-CM

## 2014-08-30 MED ORDER — LISINOPRIL 20 MG PO TABS: 20.0000 mg | ORAL_TABLET | Freq: Every day | ORAL | Status: DC

## 2014-08-30 MED ORDER — FUROSEMIDE 40 MG PO TABS: 40.0000 mg | ORAL_TABLET | Freq: Every day | ORAL | Status: DC

## 2014-08-30 NOTE — Assessment & Plan Note (Signed)
His ventricular rate appears to be well-controlled. No change in medications for now. I would consider having the patient wear a cardiac monitor, if his current medication change does not improve his symptoms.

## 2014-08-30 NOTE — Progress Notes (Signed)
HPI [name] returns today after a long absence from our EP clinic. He is a pleasant 80 yo man with atrial fib, aortic stenosis, s/p TAVR. In the interim, he notes shortness of breath with exertion. His blood pressure has been a bit elevated. He has peripheral edema. He denies chest pain. He has not had syncope. He denies medical noncompliance. No Known Allergies   Current Outpatient Prescriptions  Medication Sig Dispense Refill  . amLODipine (NORVASC) 5 MG tablet Take 5 mg by mouth daily.       . B Complex Vitamins (B COMPLEX 50) TABS Take 1 tablet by mouth daily.       . carvedilol (COREG) 12.5 MG tablet Take 1 tablet (12.5 mg total) by mouth 2 (two) times daily.  180 tablet  3  . cholecalciferol (VITAMIN D) 1000 UNITS tablet Take 1,000 Units by mouth 2 (two) times daily.      Marland Kitchen doxazosin (CARDURA) 8 MG tablet Take 8 mg by mouth at bedtime.       Marland Kitchen glipiZIDE (GLUCOTROL) 5 MG tablet Take 5 mg by mouth 2 (two) times daily before a meal.       . lisinopril-hydrochlorothiazide (PRINZIDE,ZESTORETIC) 20-12.5 MG per tablet Take 2 tablets by mouth daily.      . metFORMIN (GLUCOPHAGE) 1000 MG tablet Take 1,000 mg by mouth 2 (two) times daily with a meal.       . Potassium Gluconate 595 MG CAPS Take 595 mg by mouth 2 (two) times daily.      . Selenium 200 MCG CAPS Take 200 mcg by mouth every morning.       . simvastatin (ZOCOR) 40 MG tablet Take 40 mg by mouth at bedtime.        . vitamin A 8000 UNIT capsule Take 8,000 Units by mouth every morning.      . vitamin B-12 (CYANOCOBALAMIN) 1000 MCG tablet Take 1,000 mcg by mouth 2 (two) times daily.      . vitamin C (ASCORBIC ACID) 500 MG tablet Take 500 mg by mouth every morning.       . warfarin (COUMADIN) 7.5 MG tablet Take 7.5 mg by mouth daily. Take as directed by the coumadin clinic      . zinc gluconate 50 MG tablet Take 50 mg by mouth every morning.        No current facility-administered medications for this visit.    Facility-Administered Medications Ordered in Other Visits  Medication Dose Route Frequency Provider Last Rate Last Dose  . norepinephrine (LEVOPHED) 8 mg in dextrose 5 % 250 mL infusion  2-50 mcg/min Intravenous Titrated [name], MD         Past Medical History  Diagnosis Date  . Aortic stenosis   . HTN (hypertension)   . Bradycardia   . Hyperlipemia 12/21/2012  . Anticoagulated on Coumadin 12/21/2012  . Anemia     LOW PLATELETS OTHER DAY  PER PT  . S/P aortic valve replacement with bioprosthetic valve 01/19/2013    Transcatheter Aortic Valve Replacement using 45mm Sapien bioprosthetic tissue valve via transapical approach  . Prostate cancer     "had 40 tx of radiation in 2009" (01/21/2013)  . Heart murmur     "I've had it for years; runs in the family on daddy's side" (01/21/2013)  . Atrial fibrillation     GREG Camry Theiss, DR COOPER  . Exertional shortness of breath   . Type II diabetes mellitus   .  Arthritis     "left wrist; back sometimes" (01/21/2013)    ROS:   All systems reviewed and negative except as noted in the HPI.   Past Surgical History  Procedure Laterality Date  . Transthoracic echocardiogram  09/04/10, 09/07/08  . Cataract extraction w/ intraocular lens  implant, bilateral    . Eye surgery    . Intraoperative transesophageal echocardiogram N/A 01/19/2013    Procedure: INTRAOPERATIVE TRANSESOPHAGEAL ECHOCARDIOGRAM;  Surgeon: [name], MD;  Location: [place];  Service: Open Heart Surgery;  Laterality: N/A;  . Cardiac valve replacement  01/19/2013    AVR  . Orif forearm fracture Left 1954    "compound fx" (01/21/2013)  . Cardiac catheterization  12/16/2012     Family History  Problem Relation Age of Onset  . Diabetes Mother   . Cancer Mother 30    bone,melanoma  . Cancer Father 51    pancreatic  . Heart disease Father   . Stroke Sister   . Cancer Brother 20    ? brain cancer     History   Social History  . Marital Status: Married     Spouse Name: N/A    Number of Children: N/A  . Years of Education: N/A   Occupational History  . Not on file.   Social History Main Topics  . Smoking status: Former Smoker -- 2.00 packs/day for 22 years    Types: Cigarettes    Quit date: 09/11/1976  . Smokeless tobacco: Never Used  . Alcohol Use: No  . Drug Use: No  . Sexual Activity: No   Other Topics Concern  . Not on file   Social History Narrative  . No narrative on file     BP 160/78  Pulse 80  Ht 5' 8.5" (1.74 m)  Wt 181 lb 12.8 oz (82.464 kg)  BMI 27.24 kg/m2  Physical Exam:  Well appearing elderly man, NAD HEENT: Unremarkable Neck:  7 cm JVD, no thyromegally Back:  No CVA tenderness Lungs:  Clear with no wheezes HEART:  IRegular brady rhythm, no murmurs, no rubs, no clicks Abd:  soft, positive bowel sounds, no organomegally, no rebound, no guarding Ext:  2 plus pulses, 2+ peripheral edema, no cyanosis, no clubbing Skin:  No rashes no nodules Neuro:  CN II through XII intact, motor grossly intact  EKG -  Atrial fibrillation with a slow VR, and right bundle branch block   Assess/Plan:

## 2014-08-30 NOTE — Patient Instructions (Signed)
Your physician has recommended you make the following change in your medication:  1) Stop lisinopril/ hctz 2) Start lisinopril 20 mg once daily 3) Start lasix (furosemide) 40 mg once daily  Your physician recommends that you return for lab work in: 1 week- [place] wants you to follow-up in: 6 months with Dr. Lovena Le. You will receive a reminder letter in the mail two months in advance. If you don't receive a letter, please call our office to schedule the follow-up appointment.

## 2014-08-30 NOTE — Assessment & Plan Note (Signed)
His symptoms have worsened over the past 3 or 4 months. I've asked the patient to start taking Lasix 40 mg daily, and he will stop his hydrochlorothiazide. He will continue his ACE inhibitor and beta blocker. He is encouraged to reduce his salt intake.

## 2014-08-30 NOTE — Assessment & Plan Note (Signed)
His blood pressure is elevated today. He does not checked at home. I would consider increasing his dose of carvedilol, and as we have started Lasix, I would like to hold off on up titration of his beta blocker therapy. He is encouraged to reduce his salt intake.

## 2014-09-06 ENCOUNTER — Other Ambulatory Visit (INDEPENDENT_AMBULATORY_CARE_PROVIDER_SITE_OTHER): Payer: Medicare Other

## 2014-09-06 DIAGNOSIS — I482 Chronic atrial fibrillation, unspecified: Secondary | ICD-10-CM

## 2014-09-06 DIAGNOSIS — I5032 Chronic diastolic (congestive) heart failure: Secondary | ICD-10-CM

## 2014-09-06 DIAGNOSIS — I10 Essential (primary) hypertension: Secondary | ICD-10-CM

## 2014-09-06 LAB — BASIC METABOLIC PANEL
BUN: 36 mg/dL — ABNORMAL HIGH (ref 6–23)
CO2: 27 meq/L (ref 19–32)
Calcium: 9.7 mg/dL (ref 8.4–10.5)
Chloride: 101 mEq/L (ref 96–112)
Creatinine, Ser: 1 mg/dL (ref 0.4–1.5)
GFR: 74.74 mL/min (ref >60.00)
GLUCOSE: 111 mg/dL — AB (ref 70–99)
POTASSIUM: 4.9 meq/L (ref 3.5–5.1)
Sodium: 138 mEq/L (ref 135–145)

## 2014-09-15 ENCOUNTER — Telehealth: Payer: Self-pay | Admitting: Internal Medicine

## 2014-09-15 NOTE — Telephone Encounter (Signed)
New Message        Pt calling wanting his lab results and has concerns about his bp being too low. Please call back and advise.

## 2014-09-15 NOTE — Telephone Encounter (Signed)
Spoke with patient and let him know his labs are normal and to continue to monitor his BP  If it starts to run consistently 657-846 systolic then he will call us an dlet [place] know.  He feels good no complaints of dizziness.

## 2014-11-01 ENCOUNTER — Telehealth: Payer: Self-pay | Admitting: Cardiovascular Disease

## 2014-11-01 NOTE — Telephone Encounter (Signed)
LMOM that she should call surgeon who did AVR and find out INR range.

## 2014-11-01 NOTE — Telephone Encounter (Signed)
New Msg    Dr Orland Mustard would like to know what INR range does pt need to keep since aortic valve replacement?    Please contact [place] [phone] with info at [facility].

## 2015-02-09 ENCOUNTER — Other Ambulatory Visit: Payer: Self-pay | Admitting: *Deleted

## 2015-02-09 DIAGNOSIS — I5032 Chronic diastolic (congestive) heart failure: Secondary | ICD-10-CM

## 2015-02-09 DIAGNOSIS — Z952 Presence of prosthetic heart valve: Secondary | ICD-10-CM

## 2015-02-17 ENCOUNTER — Other Ambulatory Visit: Payer: Self-pay | Admitting: Internal Medicine

## 2015-03-15 ENCOUNTER — Ambulatory Visit (INDEPENDENT_AMBULATORY_CARE_PROVIDER_SITE_OTHER): Payer: Medicare Other | Admitting: Ophthalmology

## 2015-03-15 DIAGNOSIS — E11329 Type 2 diabetes mellitus with mild nonproliferative diabetic retinopathy without macular edema: Secondary | ICD-10-CM

## 2015-03-15 DIAGNOSIS — H35033 Hypertensive retinopathy, bilateral: Secondary | ICD-10-CM | POA: Diagnosis not present

## 2015-03-15 DIAGNOSIS — H43813 Vitreous degeneration, bilateral: Secondary | ICD-10-CM

## 2015-03-15 DIAGNOSIS — E11319 Type 2 diabetes mellitus with unspecified diabetic retinopathy without macular edema: Secondary | ICD-10-CM | POA: Diagnosis not present

## 2015-03-15 DIAGNOSIS — H35371 Puckering of macula, right eye: Secondary | ICD-10-CM | POA: Diagnosis not present

## 2015-03-15 DIAGNOSIS — I10 Essential (primary) hypertension: Secondary | ICD-10-CM | POA: Diagnosis not present

## 2015-03-16 ENCOUNTER — Other Ambulatory Visit: Payer: Self-pay

## 2015-03-16 ENCOUNTER — Other Ambulatory Visit (HOSPITAL_COMMUNITY): Payer: Self-pay

## 2015-03-16 ENCOUNTER — Ambulatory Visit (INDEPENDENT_AMBULATORY_CARE_PROVIDER_SITE_OTHER): Payer: Medicare Other | Admitting: Internal Medicine

## 2015-03-16 ENCOUNTER — Ambulatory Visit (HOSPITAL_COMMUNITY): Payer: Medicare Other | Attending: Cardiovascular Disease

## 2015-03-16 ENCOUNTER — Encounter: Payer: Self-pay | Admitting: Internal Medicine

## 2015-03-16 VITALS — BP 144/64 | HR 79 | Ht 68.5 in | Wt 187.0 lb

## 2015-03-16 DIAGNOSIS — I340 Nonrheumatic mitral (valve) insufficiency: Secondary | ICD-10-CM | POA: Diagnosis not present

## 2015-03-16 DIAGNOSIS — I482 Chronic atrial fibrillation, unspecified: Secondary | ICD-10-CM

## 2015-03-16 DIAGNOSIS — R1012 Left upper quadrant pain: Secondary | ICD-10-CM

## 2015-03-16 DIAGNOSIS — I5032 Chronic diastolic (congestive) heart failure: Secondary | ICD-10-CM

## 2015-03-16 DIAGNOSIS — I272 Other secondary pulmonary hypertension: Secondary | ICD-10-CM | POA: Insufficient documentation

## 2015-03-16 DIAGNOSIS — Z954 Presence of other heart-valve replacement: Secondary | ICD-10-CM

## 2015-03-16 DIAGNOSIS — Z953 Presence of xenogenic heart valve: Secondary | ICD-10-CM

## 2015-03-16 DIAGNOSIS — Z952 Presence of prosthetic heart valve: Secondary | ICD-10-CM | POA: Insufficient documentation

## 2015-03-16 LAB — CBC WITH DIFFERENTIAL/PLATELET
Basophils Absolute: 0 10*3/uL (ref 0.0–0.1)
Basophils Relative: 0.5 % (ref 0.0–3.0)
EOS ABS: 0.2 10*3/uL (ref 0.0–0.7)
Eosinophils Relative: 3 % (ref 0.0–5.0)
HEMATOCRIT: 35.2 % — AB (ref 39.0–52.0)
Hemoglobin: 11.9 g/dL — ABNORMAL LOW (ref 13.0–17.0)
LYMPHS ABS: 1 10*3/uL (ref 0.7–4.0)
Lymphocytes Relative: 18.4 % (ref 12.0–46.0)
MCHC: 33.7 g/dL (ref 30.0–36.0)
MCV: 96.5 fl (ref 78.0–100.0)
MONO ABS: 0.7 10*3/uL (ref 0.1–1.0)
Monocytes Relative: 11.8 % (ref 3.0–12.0)
NEUTROS PCT: 66.3 % (ref 43.0–77.0)
Neutro Abs: 3.8 10*3/uL (ref 1.4–7.7)
PLATELETS: 140 10*3/uL — AB (ref 150.0–400.0)
RBC: 3.64 Mil/uL — ABNORMAL LOW (ref 4.22–5.81)
RDW: 13.1 % (ref 11.5–15.5)
WBC: 5.7 10*3/uL (ref 4.0–10.5)

## 2015-03-16 LAB — LACTATE DEHYDROGENASE: LDH: 244 U/L (ref 94–250)

## 2015-03-16 NOTE — Assessment & Plan Note (Signed)
Exam demonstrated tenderness around the spleen. Will check a CBC and LDH looking for evidence of anemia/hemolysis. He might require a CT of the abdomen.

## 2015-03-16 NOTE — Patient Instructions (Signed)
Medication Instructions:  Your physician recommends that you continue on your current medications as directed. Please refer to the Current Medication list given to you today.   Labwork: Your physician recommends that you return for lab work TODAY (CBC/LDH)   Testing/Procedures: NONE  Follow-Up: Your physician wants you to follow-up in: 1 year with Dr. Lovena Le. You will receive a reminder letter in the mail two months in advance. If you don't receive a letter, please call our office to schedule the follow-up appointment.   Any Other Special Instructions Will Be Listed Below (If Applicable).

## 2015-03-16 NOTE — Assessment & Plan Note (Signed)
He has class 2 symptoms. He is encouraged to reduce his sodium intake. No change in meds.

## 2015-03-16 NOTE — Progress Notes (Signed)
HPI [name] returns today for followup. He is a pleasant 81 yo man with atrial fib, aortic stenosis, s/p TAVR. In the interim, he notes left upper quadrant pain. He has not had fever or chills. His blood pressure has been well controlled. He has minimal  peripheral edema. He denies chest pain. He has not had syncope. He denies medical noncompliance.  No Known Allergies   Current Outpatient Prescriptions  Medication Sig Dispense Refill  . amLODipine (NORVASC) 5 MG tablet Take 5 mg by mouth daily.     . B Complex Vitamins (B COMPLEX 50) TABS Take 1 tablet by mouth daily.     . carvedilol (COREG) 12.5 MG tablet TAKE ONE TABLET BY MOUTH TWICE DAILY 180 tablet 3  . cholecalciferol (VITAMIN D) 1000 UNITS tablet Take 1,000 Units by mouth 2 (two) times daily.    Marland Kitchen doxazosin (CARDURA) 8 MG tablet Take 8 mg by mouth at bedtime.     . furosemide (LASIX) 40 MG tablet Take 1 tablet (40 mg total) by mouth daily. 30 tablet 11  . glipiZIDE (GLUCOTROL) 5 MG tablet Take 5 mg by mouth 2 (two) times daily before a meal.     . lisinopril (PRINIVIL,ZESTRIL) 20 MG tablet Take 1 tablet (20 mg total) by mouth daily. 30 tablet 11  . metFORMIN (GLUCOPHAGE) 1000 MG tablet Take 1,000 mg by mouth 2 (two) times daily with a meal.     . Potassium Gluconate 595 MG CAPS Take 595 mg by mouth 2 (two) times daily.    . Selenium 200 MCG CAPS Take 200 mcg by mouth every morning.     . simvastatin (ZOCOR) 40 MG tablet Take 40 mg by mouth at bedtime.      . vitamin A 8000 UNIT capsule Take 8,000 Units by mouth every morning.    . vitamin B-12 (CYANOCOBALAMIN) 1000 MCG tablet Take 1,000 mcg by mouth 2 (two) times daily.    . vitamin C (ASCORBIC ACID) 500 MG tablet Take 500 mg by mouth every morning.     . warfarin (COUMADIN) 7.5 MG tablet Take 7.5 mg by mouth daily. Take as directed by the coumadin clinic    . zinc gluconate 50 MG tablet Take 50 mg by mouth every morning.      No current facility-administered  medications for this visit.   Facility-Administered Medications Ordered in Other Visits  Medication Dose Route Frequency Provider Last Rate Last Dose  . norepinephrine (LEVOPHED) 8 mg in dextrose 5 % 250 mL infusion  2-50 mcg/min Intravenous Titrated [name], MD         Past Medical History  Diagnosis Date  . Aortic stenosis   . HTN (hypertension)   . Bradycardia   . Hyperlipemia 12/21/2012  . Anticoagulated on Coumadin 12/21/2012  . Anemia     LOW PLATELETS OTHER DAY  PER PT  . S/P aortic valve replacement with bioprosthetic valve 01/19/2013    Transcatheter Aortic Valve Replacement using 52mm Sapien bioprosthetic tissue valve via transapical approach  . Prostate cancer     "had 40 tx of radiation in 2009" (01/21/2013)  . Heart murmur     "I've had it for years; runs in the family on daddy's side" (01/21/2013)  . Atrial fibrillation     [name], DR COOPER  . Exertional shortness of breath   . Type II diabetes mellitus   . Arthritis     "left wrist; back sometimes" (01/21/2013)  ROS:   All systems reviewed and negative except as noted in the HPI.   Past Surgical History  Procedure Laterality Date  . Transthoracic echocardiogram  09/04/10, 09/07/08  . Cataract extraction w/ intraocular lens  implant, bilateral    . Eye surgery    . Intraoperative transesophageal echocardiogram N/A 01/19/2013    Procedure: INTRAOPERATIVE TRANSESOPHAGEAL ECHOCARDIOGRAM;  Surgeon: [name], MD;  Location: [facility];  Service: Open Heart Surgery;  Laterality: N/A;  . Cardiac valve replacement  01/19/2013    AVR  . Orif forearm fracture Left 1954    "compound fx" (01/21/2013)  . Cardiac catheterization  12/16/2012     Family History  Problem Relation Age of Onset  . Diabetes Mother   . Cancer Mother 68    bone,melanoma  . Cancer Father 63    pancreatic  . Heart disease Father   . Stroke Sister   . Cancer Brother 20    ? brain cancer     History   Social History  .  Marital Status: Married    Spouse Name: N/A  . Number of Children: N/A  . Years of Education: N/A   Occupational History  . Not on file.   Social History Main Topics  . Smoking status: Former Smoker -- 2.00 packs/day for 22 years    Types: Cigarettes    Quit date: 09/11/1976  . Smokeless tobacco: Never Used  . Alcohol Use: No  . Drug Use: No  . Sexual Activity: No   Other Topics Concern  . Not on file   Social History Narrative     BP 144/64 mmHg  Pulse 79  Ht 5' 8.5" (1.74 m)  Wt 187 lb (84.823 kg)  BMI 28.02 kg/m2  Physical Exam:  Well appearing elderly man, NAD HEENT: Unremarkable Neck:  7 cm JVD, no thyromegally Back:  No CVA tenderness Lungs:  Clear with no wheezes HEART:  IRegular brady rhythm, no murmurs, no rubs, no clicks Abd:  soft, positive bowel sounds, no organomegally, no rebound, minimal left upper quadrant tenderness Ext:  2 plus pulses, 2+ peripheral edema, no cyanosis, no clubbing Skin:  No rashes no nodules Neuro:  CN II through XII intact, motor grossly intact    Assess/Plan:

## 2015-03-16 NOTE — Assessment & Plan Note (Signed)
He is pending followup with Dr. Burt Knack. No change in meds for now. His valve sounds good and I do not appreciate evidence of AI. Echo to follow.

## 2015-03-16 NOTE — Assessment & Plan Note (Signed)
His ventricular rate is well controlled. No change in meds.

## 2015-03-21 ENCOUNTER — Encounter: Payer: Self-pay | Admitting: Internal Medicine

## 2015-03-21 NOTE — Telephone Encounter (Signed)
This encounter was created in error - please disregard.

## 2015-03-21 NOTE — Telephone Encounter (Addendum)
New message     Returning a nurses call to get echo results.  It is acutally Dr [name] pt

## 2015-03-28 ENCOUNTER — Telehealth: Payer: Self-pay | Admitting: Internal Medicine

## 2015-03-28 NOTE — Telephone Encounter (Signed)
Pt asking for lab results done 03/16/15. Pt advised Dr Lovena Le has not reviewed, I will forward this message to Dr Lovena Le to let him know pt calling for results, pt would like to be contacted once Dr Lovena Le has reviewed the lab results.

## 2015-03-28 NOTE — Telephone Encounter (Signed)
New Message  Pt calling about lab results. Please call back and discuss.  

## 2015-03-30 NOTE — Telephone Encounter (Signed)
Dr Lovena Le reviewed and labs are stable.  Patient aware and request copy be faxed to his PCP Dr [name] at [phone].  I have sent

## 2015-04-10 ENCOUNTER — Other Ambulatory Visit: Payer: Self-pay | Admitting: Family Medicine

## 2015-04-10 DIAGNOSIS — R1012 Left upper quadrant pain: Secondary | ICD-10-CM

## 2015-04-17 ENCOUNTER — Ambulatory Visit
Admission: RE | Admit: 2015-04-17 | Discharge: 2015-04-17 | Disposition: A | Discharge status: Home / Self Care | Payer: Medicare Other | Source: Ambulatory Visit | Attending: Family Medicine | Admitting: Family Medicine

## 2015-04-17 DIAGNOSIS — R1012 Left upper quadrant pain: Secondary | ICD-10-CM

## 2015-08-09 ENCOUNTER — Other Ambulatory Visit: Payer: Self-pay | Admitting: Internal Medicine

## 2015-08-16 ENCOUNTER — Other Ambulatory Visit: Payer: Self-pay | Admitting: Internal Medicine

## 2015-08-18 NOTE — Telephone Encounter (Signed)
[name], MD at 03/16/2015 11:01 AM  furosemide (LASIX) 40 MG tabletTake 1 tablet (40 mg total) by mouth daily Medication Instructions:  Your physician recommends that you continue on your current medications as directed. Please refer to the Current Medication list given to you today

## 2015-08-20 IMAGING — US US ABDOMEN COMPLETE
1 series · 13 of 25 positions shown · non-contrast
Comparison: CT abdomen and pelvis December 30, 2012

CLINICAL DATA: Upper quadrant pain and soreness for 1 month

EXAM:
ULTRASOUND ABDOMEN COMPLETE

[Series 1: us abdomen complete · 0.30mm/px · 13 of 68 slices shown]
[im 1/68]
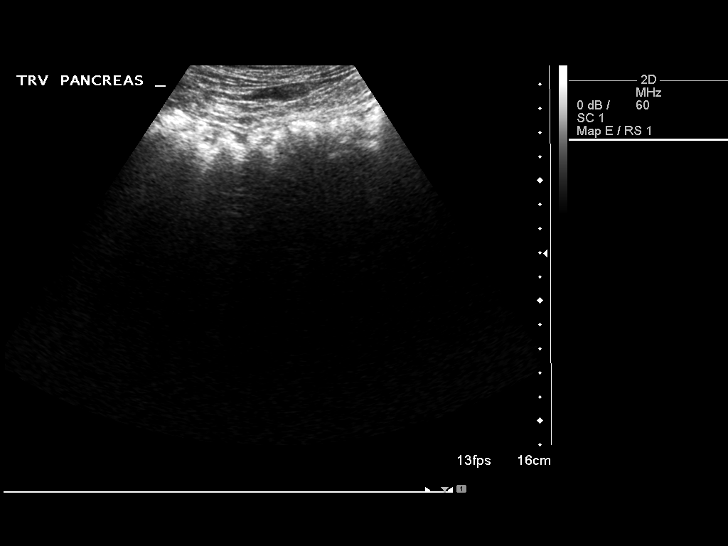
[im 6/68]
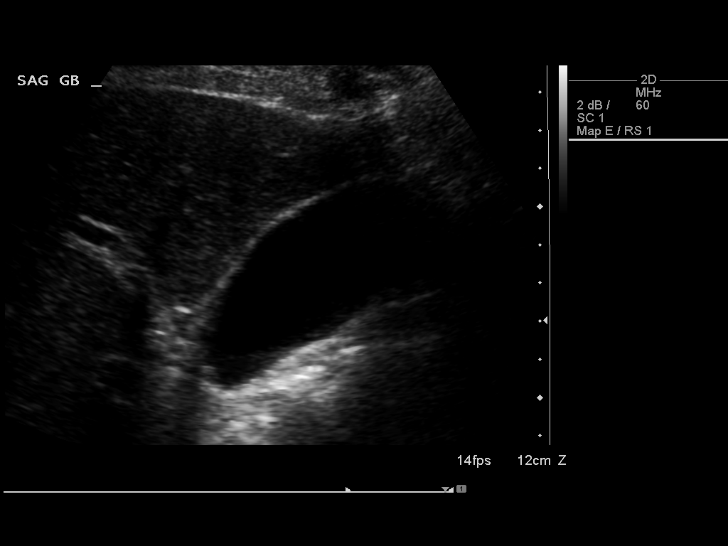
[im 12/68]
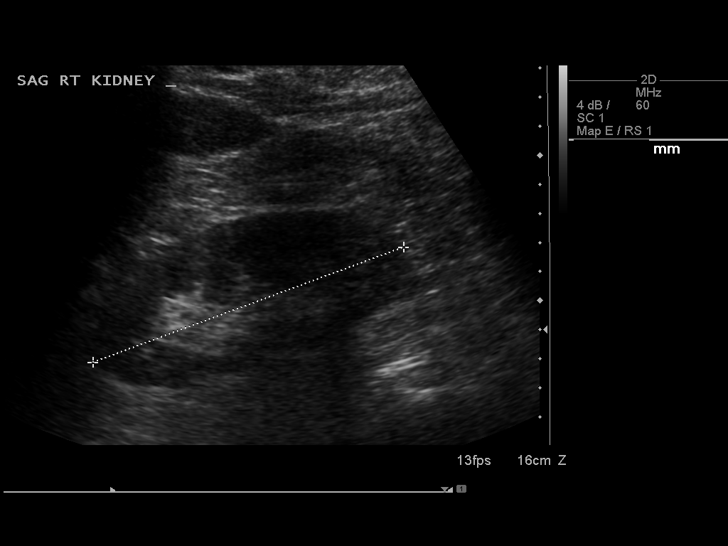
[im 17/68]
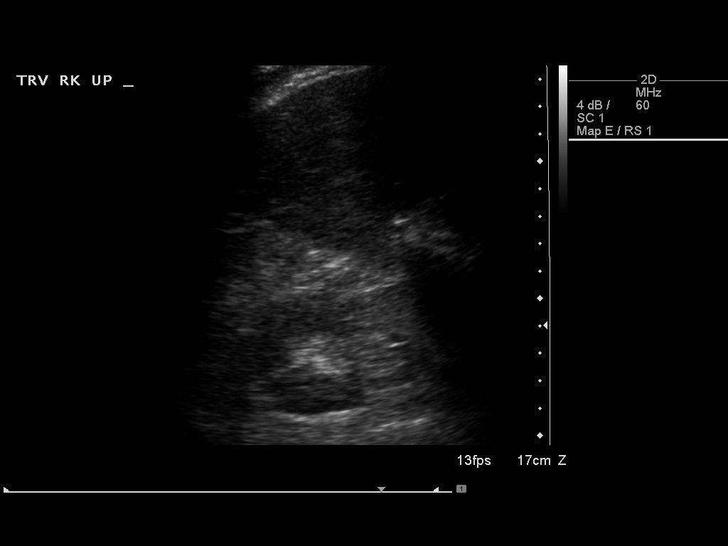
[im 23/68]
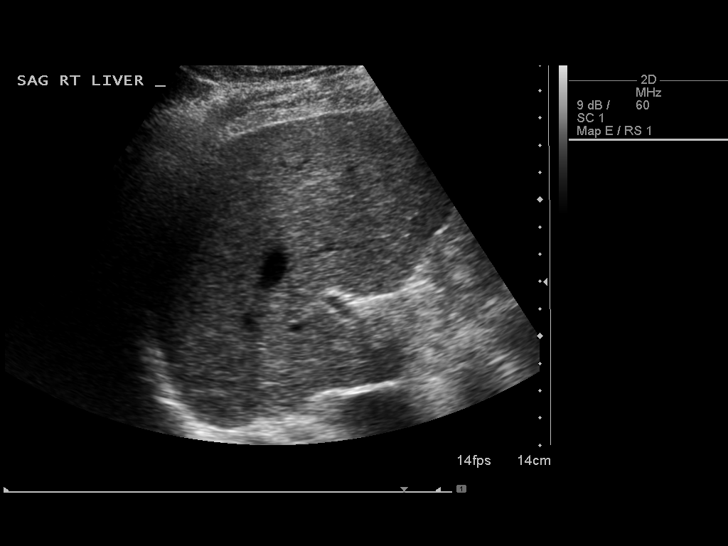
[im 28/68]
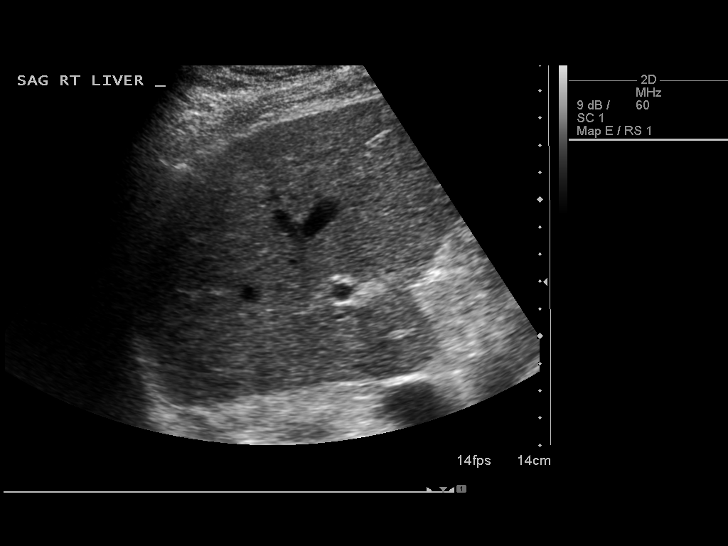
[im 34/68]
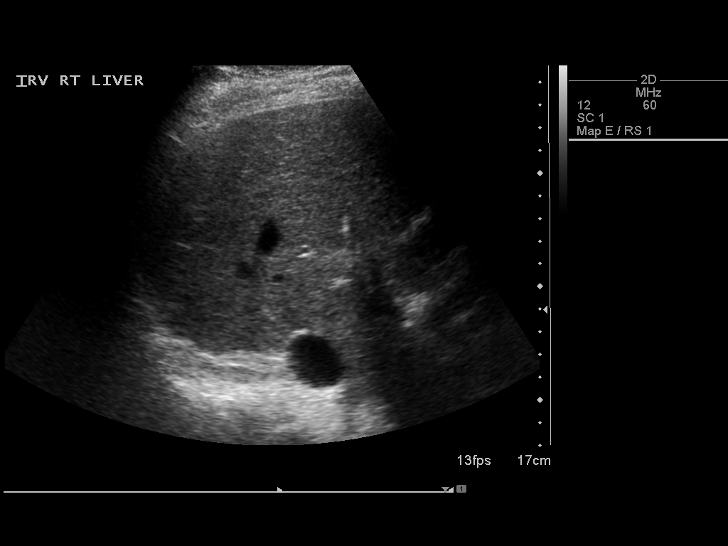
[im 40/68]
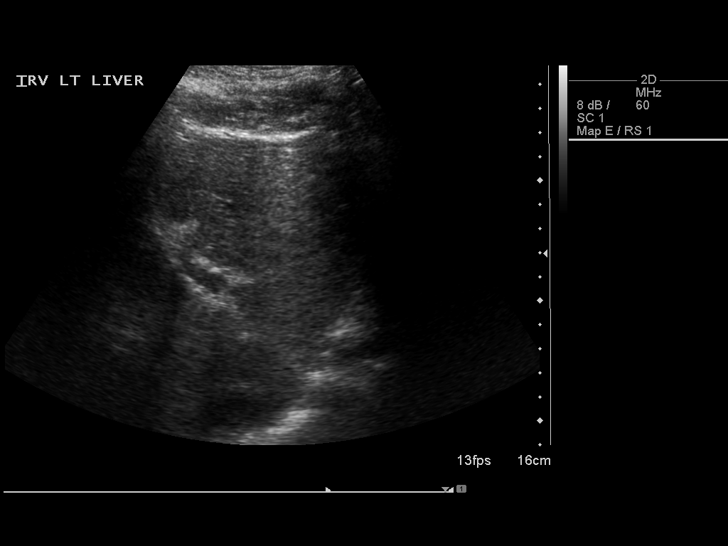
[im 45/68]
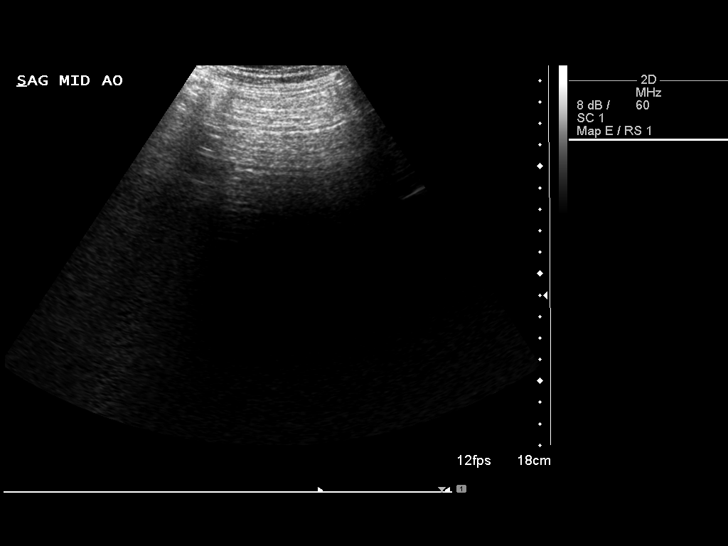
[im 51/68]
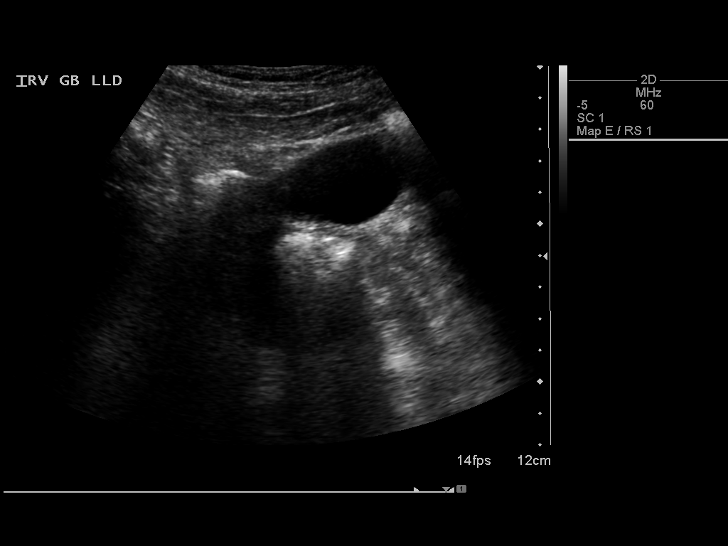
[im 56/68]
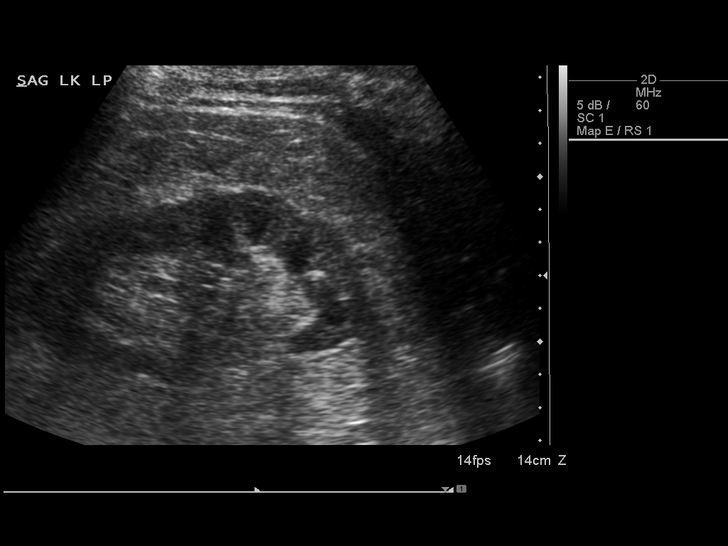
[im 62/68]
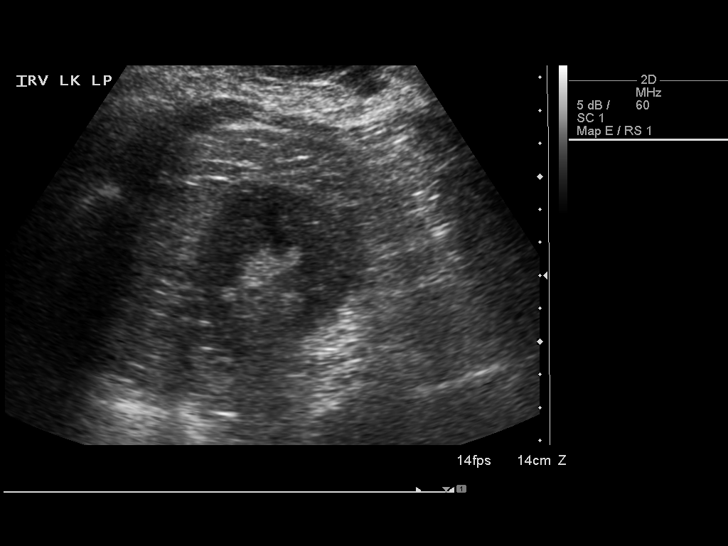
[im 68/68]
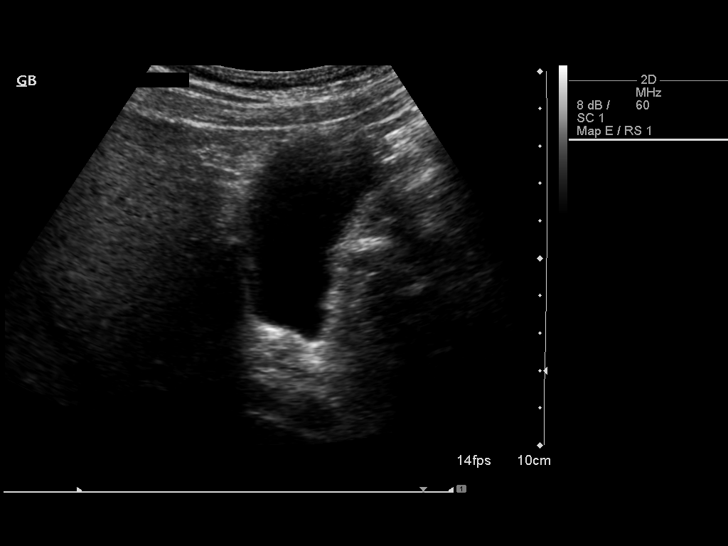

[13 of 25 positions shown; findings below may reference images not displayed]

FINDINGS: Gallbladder: No gallstones or wall thickening visualized. There is
no pericholecystic fluid. No sonographic Murphy sign noted.

Common bile duct: Diameter: 3 mm. There is no intrahepatic, common
hepatic, or common bile duct dilatation. Note that the distal common
bile duct is partially obscured by gas.

Liver: No focal lesion identified. Within normal limits in
parenchymal echogenicity.

IVC: No abnormality visualized in visualized portions. Portions of
the inferior vena cava are obscured by gas.

Pancreas: Pancreas is essentially completely obscured by gas.

Spleen: Size and appearance within normal limits.

Right Kidney: Length: 11.4 cm. Echogenicity within normal limits. No
mass or hydronephrosis visualized.

Left Kidney: Length: 10.7 cm. Echogenicity within normal limits. No
mass or hydronephrosis visualized.

Abdominal aorta: Essentially all of the abdominal aorta is obscured
by gas.

Other findings: No demonstrable ascites.
IMPRESSION: Nonvisualization of pancreas and most of the aorta. Portions of the
inferior vena cava and distal common bile duct are also obscured by
gas. Study otherwise unremarkable.

## 2015-12-06 ENCOUNTER — Other Ambulatory Visit (HOSPITAL_COMMUNITY): Payer: Self-pay | Admitting: Urology

## 2015-12-06 DIAGNOSIS — C61 Malignant neoplasm of prostate: Secondary | ICD-10-CM

## 2015-12-14 ENCOUNTER — Ambulatory Visit (HOSPITAL_COMMUNITY)
Admission: RE | Admit: 2015-12-14 | Discharge: 2015-12-14 | Disposition: A | Discharge status: Home / Self Care | Payer: Medicare Other | Source: Ambulatory Visit | Attending: Urology | Admitting: Urology

## 2015-12-14 ENCOUNTER — Encounter (HOSPITAL_COMMUNITY)
Admission: RE | Admit: 2015-12-14 | Discharge: 2015-12-14 | Disposition: A | Discharge status: Home / Self Care | Payer: Medicare Other | Source: Ambulatory Visit | Attending: Urology | Admitting: Urology

## 2015-12-14 DIAGNOSIS — M19031 Primary osteoarthritis, right wrist: Secondary | ICD-10-CM | POA: Insufficient documentation

## 2015-12-14 DIAGNOSIS — M25551 Pain in right hip: Secondary | ICD-10-CM | POA: Diagnosis not present

## 2015-12-14 DIAGNOSIS — M179 Osteoarthritis of knee, unspecified: Secondary | ICD-10-CM | POA: Diagnosis not present

## 2015-12-14 DIAGNOSIS — C61 Malignant neoplasm of prostate: Secondary | ICD-10-CM | POA: Insufficient documentation

## 2015-12-14 MED ORDER — TECHNETIUM TC 99M MEDRONATE IV KIT
25.0000 | PACK | Freq: Once | INTRAVENOUS | Status: AC | PRN
  Administered 2015-12-14: 24.1 via INTRAVENOUS

## 2015-12-15 ENCOUNTER — Other Ambulatory Visit: Payer: Self-pay | Admitting: Internal Medicine

## 2015-12-20 ENCOUNTER — Other Ambulatory Visit: Payer: Self-pay | Admitting: Gastroenterology

## 2015-12-20 DIAGNOSIS — R1013 Epigastric pain: Secondary | ICD-10-CM | POA: Insufficient documentation

## 2015-12-20 DIAGNOSIS — Z86010 Personal history of colonic polyps: Secondary | ICD-10-CM | POA: Insufficient documentation

## 2015-12-21 ENCOUNTER — Ambulatory Visit
Admission: RE | Admit: 2015-12-21 | Discharge: 2015-12-21 | Disposition: A | Discharge status: Home / Self Care | Payer: Medicare Other | Source: Ambulatory Visit | Attending: Gastroenterology | Admitting: Gastroenterology

## 2015-12-21 DIAGNOSIS — R1013 Epigastric pain: Secondary | ICD-10-CM

## 2016-02-09 ENCOUNTER — Other Ambulatory Visit: Payer: Self-pay | Admitting: Internal Medicine

## 2016-03-14 ENCOUNTER — Ambulatory Visit (INDEPENDENT_AMBULATORY_CARE_PROVIDER_SITE_OTHER): Payer: Medicare Other | Admitting: Ophthalmology

## 2016-03-14 DIAGNOSIS — I10 Essential (primary) hypertension: Secondary | ICD-10-CM

## 2016-03-14 DIAGNOSIS — H43813 Vitreous degeneration, bilateral: Secondary | ICD-10-CM

## 2016-03-14 DIAGNOSIS — H33302 Unspecified retinal break, left eye: Secondary | ICD-10-CM

## 2016-03-14 DIAGNOSIS — E113292 Type 2 diabetes mellitus with mild nonproliferative diabetic retinopathy without macular edema, left eye: Secondary | ICD-10-CM | POA: Diagnosis not present

## 2016-03-14 DIAGNOSIS — H35371 Puckering of macula, right eye: Secondary | ICD-10-CM

## 2016-03-14 DIAGNOSIS — H35033 Hypertensive retinopathy, bilateral: Secondary | ICD-10-CM

## 2016-03-14 DIAGNOSIS — E11319 Type 2 diabetes mellitus with unspecified diabetic retinopathy without macular edema: Secondary | ICD-10-CM

## 2016-04-02 ENCOUNTER — Other Ambulatory Visit: Payer: Self-pay | Admitting: Internal Medicine

## 2016-04-02 ENCOUNTER — Encounter: Payer: Self-pay | Admitting: *Deleted

## 2016-04-02 DIAGNOSIS — M1990 Unspecified osteoarthritis, unspecified site: Secondary | ICD-10-CM | POA: Insufficient documentation

## 2016-04-02 DIAGNOSIS — D7589 Other specified diseases of blood and blood-forming organs: Secondary | ICD-10-CM | POA: Insufficient documentation

## 2016-04-02 DIAGNOSIS — E785 Hyperlipidemia, unspecified: Secondary | ICD-10-CM | POA: Insufficient documentation

## 2016-04-02 DIAGNOSIS — I10 Essential (primary) hypertension: Secondary | ICD-10-CM | POA: Insufficient documentation

## 2016-04-02 DIAGNOSIS — Z86010 Personal history of colonic polyps: Secondary | ICD-10-CM | POA: Insufficient documentation

## 2016-04-02 DIAGNOSIS — E1169 Type 2 diabetes mellitus with other specified complication: Secondary | ICD-10-CM | POA: Insufficient documentation

## 2016-04-02 DIAGNOSIS — Z7901 Long term (current) use of anticoagulants: Secondary | ICD-10-CM | POA: Insufficient documentation

## 2016-04-02 DIAGNOSIS — E119 Type 2 diabetes mellitus without complications: Secondary | ICD-10-CM | POA: Insufficient documentation

## 2016-04-02 DIAGNOSIS — D649 Anemia, unspecified: Secondary | ICD-10-CM | POA: Insufficient documentation

## 2016-04-02 DIAGNOSIS — C61 Malignant neoplasm of prostate: Secondary | ICD-10-CM | POA: Insufficient documentation

## 2016-04-05 ENCOUNTER — Ambulatory Visit (INDEPENDENT_AMBULATORY_CARE_PROVIDER_SITE_OTHER): Payer: Medicare Other | Admitting: Internal Medicine

## 2016-04-05 ENCOUNTER — Encounter: Payer: Self-pay | Admitting: Internal Medicine

## 2016-04-05 VITALS — BP 154/64 | HR 78 | Ht 68.5 in | Wt 188.8 lb

## 2016-04-05 DIAGNOSIS — I482 Chronic atrial fibrillation, unspecified: Secondary | ICD-10-CM

## 2016-04-05 NOTE — Patient Instructions (Signed)
Medication Instructions:  Your physician has recommended you make the following change in your medication:  1) Stop Simvastatin--if in 4 weeks you don't feel any better restart.  Call the office and let us know if you restart   Labwork: None ordered   Testing/Procedures: None ordered   Follow-Up: Your physician wants you to follow-up in: 12 months with Dr Knox Saliva will receive a reminder letter in the mail two months in advance. If you don't receive a letter, please call our office to schedule the follow-up appointment.   Any Other Special Instructions Will Be Listed Below (If Applicable).     If you need a refill on your cardiac medications before your next appointment, please call your pharmacy.

## 2016-04-05 NOTE — Progress Notes (Signed)
HPI [name] returns today for followup. He is a pleasant 82 yo man with atrial fib, aortic stenosis, s/p TAVR. In the interim, he notes fatigue and weakness and muscle aches. He has not had fever or chills. His blood pressure has been a bit elevated. He has minimal  peripheral edema. He denies chest pain. He has not had syncope. He denies medical noncompliance.  No Known Allergies   Current Outpatient Prescriptions  Medication Sig Dispense Refill  . amLODipine (NORVASC) 5 MG tablet Take 5 mg by mouth daily.     . B Complex Vitamins (B COMPLEX 50) TABS Take 1 tablet by mouth daily.     . carvedilol (COREG) 12.5 MG tablet TAKE ONE TABLET BY MOUTH TWICE DAILY 180 tablet 0  . cholecalciferol (VITAMIN D) 1000 UNITS tablet Take 1,000 Units by mouth 2 (two) times daily.    Marland Kitchen doxazosin (CARDURA) 8 MG tablet Take 8 mg by mouth at bedtime.     . furosemide (LASIX) 40 MG tablet TAKE ONE TABLET BY MOUTH ONCE DAILY 90 tablet 3  . glipiZIDE (GLUCOTROL) 5 MG tablet Take 5 mg by mouth 2 (two) times daily before a meal.     . lisinopril (PRINIVIL,ZESTRIL) 20 MG tablet TAKE ONE TABLET BY MOUTH ONCE DAILY 30 tablet 1  . metFORMIN (GLUCOPHAGE) 1000 MG tablet Take 1,000 mg by mouth 2 (two) times daily with a meal.     . Potassium Gluconate 595 MG CAPS Take 595 mg by mouth 2 (two) times daily.    . Selenium 200 MCG CAPS Take 200 mcg by mouth every morning.     . simvastatin (ZOCOR) 40 MG tablet Take 40 mg by mouth at bedtime.      . vitamin A 8000 UNIT capsule Take 8,000 Units by mouth every morning.    . vitamin B-12 (CYANOCOBALAMIN) 1000 MCG tablet Take 1,000 mcg by mouth 2 (two) times daily.    . vitamin C (ASCORBIC ACID) 500 MG tablet Take 500 mg by mouth 2 (two) times daily.     Marland Kitchen warfarin (COUMADIN) 7.5 MG tablet Take 7.5 mg by mouth daily. Take as directed by the coumadin clinic    . zinc gluconate 50 MG tablet Take 50 mg by mouth every morning.      No current facility-administered  medications for this visit.   Facility-Administered Medications Ordered in Other Visits  Medication Dose Route Frequency Provider Last Rate Last Dose  . norepinephrine (LEVOPHED) 8 mg in dextrose 5 % 250 mL infusion  2-50 mcg/min Intravenous Titrated Aaron Alberts, MD         Past Medical History  Diagnosis Date  . Aortic stenosis   . HTN (hypertension)   . Bradycardia   . Hyperlipemia 12/21/2012  . Anticoagulated on Coumadin 12/21/2012  . Anemia     LOW PLATELETS OTHER DAY  PER PT  . S/P aortic valve replacement with bioprosthetic valve 01/19/2013    Transcatheter Aortic Valve Replacement using 48mm Sapien bioprosthetic tissue valve via transapical approach  . Prostate cancer [facility])     "had 40 tx of radiation in 2009" (01/21/2013)  . Heart murmur     "I've had it for years; runs in the family on daddy's side" (01/21/2013)  . Atrial fibrillation ([place])     [name], DR COOPER  . Exertional shortness of breath   . Type II diabetes mellitus ([place])   . Arthritis     "left wrist; back  sometimes" (01/21/2013)    ROS:   All systems reviewed and negative except as noted in the HPI.   Past Surgical History  Procedure Laterality Date  . Transthoracic echocardiogram  09/04/10, 09/07/08  . Cataract extraction w/ intraocular lens  implant, bilateral    . Eye surgery    . Intraoperative transesophageal echocardiogram N/A 01/19/2013    Procedure: INTRAOPERATIVE TRANSESOPHAGEAL ECHOCARDIOGRAM;  Surgeon: Aaron Alberts, MD;  Location: [place] [facility];  Service: Open Heart Surgery;  Laterality: N/A;  . Cardiac valve replacement  01/19/2013    AVR  . Orif forearm fracture Left 1954    "compound fx" (01/21/2013)  . Cardiac catheterization  12/16/2012     Family History  Problem Relation Age of Onset  . Diabetes Mother   . Melanoma Mother 95  . Pancreatic cancer Father 62  . Heart disease Father   . Stroke Sister   . Cancer Brother 20    ? brain cancer  . Bone cancer Mother 43     Social  History   Social History  . Marital Status: Married    Spouse Name: N/A  . Number of Children: N/A  . Years of Education: N/A   Occupational History  . Not on file.   Social History Main Topics  . Smoking status: Former Smoker -- 2.00 packs/day for 22 years    Types: Cigarettes    Quit date: 09/11/1976  . Smokeless tobacco: Never Used  . Alcohol Use: No  . Drug Use: No  . Sexual Activity: No   Other Topics Concern  . Not on file   Social History Narrative     BP 154/64 mmHg  Pulse 78  Ht 5' 8.5" (1.74 m)  Wt 188 lb 12.8 oz (85.639 kg)  BMI 28.29 kg/m2 135/70 by me. Physical Exam:  Well appearing elderly man, NAD HEENT: Unremarkable Neck:  7 cm JVD, no thyromegally Back:  No CVA tenderness Lungs:  Clear with no wheezes HEART:  IRegular brady rhythm, no murmurs, no rubs, no clicks Abd:  soft, positive bowel sounds, no organomegally, no rebound, minimal left upper quadrant tenderness Ext:  2 plus pulses, 2+ peripheral edema, no cyanosis, no clubbing Skin:  No rashes no nodules Neuro:  CN II through XII intact, motor grossly intact  ECG - atrial fib with RBBB  Assess/Plan: 1. Atrial fibrillation - his ventricular escape is reasonably well controlled. Will follow. 2. Muscle aches - this appears to be his biggest symptom. I have asked the patient to stop his simvastatin. 3. HTN - his blood pressure is minimally elevated on my exam. Will follow. 4. Chronic diastolic heart failure - his symptoms remain class 2. No change in meds. I have asked him to maintain a low sodium diet.  Mikle Bosworth.D.

## 2016-04-08 ENCOUNTER — Other Ambulatory Visit: Payer: Self-pay | Admitting: Internal Medicine

## 2016-05-02 ENCOUNTER — Other Ambulatory Visit: Payer: Self-pay | Admitting: Internal Medicine

## 2017-01-21 ENCOUNTER — Other Ambulatory Visit: Payer: Self-pay | Admitting: Urology

## 2017-01-21 DIAGNOSIS — Z79899 Other long term (current) drug therapy: Secondary | ICD-10-CM

## 2017-01-21 DIAGNOSIS — C61 Malignant neoplasm of prostate: Secondary | ICD-10-CM

## 2017-01-30 ENCOUNTER — Ambulatory Visit
Admission: RE | Admit: 2017-01-30 | Discharge: 2017-01-30 | Disposition: A | Discharge status: Home / Self Care | Payer: Medicare Other | Source: Ambulatory Visit | Attending: Urology | Admitting: Urology

## 2017-01-30 DIAGNOSIS — C61 Malignant neoplasm of prostate: Secondary | ICD-10-CM

## 2017-01-30 DIAGNOSIS — Z79899 Other long term (current) drug therapy: Secondary | ICD-10-CM

## 2017-03-14 ENCOUNTER — Ambulatory Visit (INDEPENDENT_AMBULATORY_CARE_PROVIDER_SITE_OTHER): Payer: Medicare Other | Admitting: Ophthalmology

## 2017-03-14 DIAGNOSIS — H35033 Hypertensive retinopathy, bilateral: Secondary | ICD-10-CM | POA: Diagnosis not present

## 2017-03-14 DIAGNOSIS — E113392 Type 2 diabetes mellitus with moderate nonproliferative diabetic retinopathy without macular edema, left eye: Secondary | ICD-10-CM | POA: Diagnosis not present

## 2017-03-14 DIAGNOSIS — H33302 Unspecified retinal break, left eye: Secondary | ICD-10-CM

## 2017-03-14 DIAGNOSIS — E11319 Type 2 diabetes mellitus with unspecified diabetic retinopathy without macular edema: Secondary | ICD-10-CM | POA: Diagnosis not present

## 2017-03-14 DIAGNOSIS — H35371 Puckering of macula, right eye: Secondary | ICD-10-CM | POA: Diagnosis not present

## 2017-03-14 DIAGNOSIS — I10 Essential (primary) hypertension: Secondary | ICD-10-CM

## 2017-03-14 DIAGNOSIS — H43813 Vitreous degeneration, bilateral: Secondary | ICD-10-CM

## 2017-03-24 ENCOUNTER — Other Ambulatory Visit: Payer: Self-pay | Admitting: Internal Medicine

## 2017-04-26 ENCOUNTER — Other Ambulatory Visit: Payer: Self-pay | Admitting: Internal Medicine

## 2017-05-05 ENCOUNTER — Other Ambulatory Visit: Payer: Self-pay | Admitting: Internal Medicine

## 2017-05-19 ENCOUNTER — Other Ambulatory Visit: Payer: Self-pay | Admitting: Internal Medicine

## 2017-05-24 ENCOUNTER — Other Ambulatory Visit: Payer: Self-pay | Admitting: Internal Medicine

## 2017-06-03 ENCOUNTER — Other Ambulatory Visit: Payer: Self-pay | Admitting: Internal Medicine

## 2017-06-17 ENCOUNTER — Other Ambulatory Visit: Payer: Self-pay | Admitting: Internal Medicine

## 2017-06-18 ENCOUNTER — Other Ambulatory Visit: Payer: Self-pay | Admitting: *Deleted

## 2017-06-18 ENCOUNTER — Telehealth: Payer: Self-pay | Admitting: Internal Medicine

## 2017-06-18 MED ORDER — LISINOPRIL 20 MG PO TABS: 20.0000 mg | ORAL_TABLET | Freq: Every day | ORAL | 0 refills | Status: DC

## 2017-06-18 NOTE — Telephone Encounter (Signed)
New Message    *STAT* If patient is at the pharmacy, call can be transferred to refill team.   1. Which medications need to be refilled? (please list name of each medication and dose if known) lisinopril (PRINIVIL,ZESTRIL) 20 MG tablet  2. Which pharmacy/location (including street and city if local pharmacy) is medication to be sent to? [facility] [place], [place] - [address]  3. Do they need a 30 day or 90 day supply? 90 day supply

## 2017-06-25 ENCOUNTER — Other Ambulatory Visit: Payer: Self-pay | Admitting: Internal Medicine

## 2017-06-27 ENCOUNTER — Ambulatory Visit (INDEPENDENT_AMBULATORY_CARE_PROVIDER_SITE_OTHER): Payer: Medicare Other | Admitting: Internal Medicine

## 2017-06-27 ENCOUNTER — Encounter: Payer: Self-pay | Admitting: Internal Medicine

## 2017-06-27 VITALS — BP 140/68 | HR 72 | Ht 68.5 in | Wt 185.6 lb

## 2017-06-27 DIAGNOSIS — I482 Chronic atrial fibrillation, unspecified: Secondary | ICD-10-CM

## 2017-06-27 DIAGNOSIS — I739 Peripheral vascular disease, unspecified: Secondary | ICD-10-CM | POA: Diagnosis not present

## 2017-06-27 NOTE — Progress Notes (Signed)
HPI [name] returns today for follow-up. He is an 83 year old man with a history of aortic stenosis, atrial fibrillation, chronic diastolic heart failure, status post TAVR. He c/o fatigue and malaise. He has undergone treatment for prostate CA and his testosterone level is zero. He c/o pain in his hips and legs/thigh when he walks which gets better on resting. No chest pain. No Known Allergies   Current Outpatient Prescriptions  Medication Sig Dispense Refill  . amLODipine (NORVASC) 5 MG tablet Take 5 mg by mouth daily.     . B Complex Vitamins (B COMPLEX 50) TABS Take 1 tablet by mouth daily.     . carvedilol (COREG) 12.5 MG tablet TAKE 1 TABLET BY MOUTH TWICE DAILY 60 tablet 0  . cholecalciferol (VITAMIN D) 1000 UNITS tablet Take 1,000 Units by mouth 2 (two) times daily.    Marland Kitchen doxazosin (CARDURA) 8 MG tablet Take 8 mg by mouth at bedtime.     . furosemide (LASIX) 40 MG tablet TAKE ONE TABLET BY MOUTH ONCE DAILY 90 tablet 3  . glipiZIDE (GLUCOTROL) 5 MG tablet Take 5 mg by mouth 2 (two) times daily before a meal.     . lisinopril (PRINIVIL,ZESTRIL) 20 MG tablet Take 1 tablet (20 mg total) by mouth daily. Patient needs to keep 09/03/17 appointment for further refills 90 tablet 0  . metFORMIN (GLUCOPHAGE) 1000 MG tablet Take 1,000 mg by mouth 2 (two) times daily with a meal.     . Potassium Gluconate 595 MG CAPS Take 595 mg by mouth 2 (two) times daily.    . Selenium 200 MCG CAPS Take 200 mcg by mouth every morning.     . vitamin A 8000 UNIT capsule Take 8,000 Units by mouth every morning.    . vitamin B-12 (CYANOCOBALAMIN) 1000 MCG tablet Take 1,000 mcg by mouth 2 (two) times daily.    . vitamin C (ASCORBIC ACID) 500 MG tablet Take 500 mg by mouth 2 (two) times daily.     Marland Kitchen warfarin (COUMADIN) 7.5 MG tablet Take 7.5 mg by mouth daily. Take as directed by the coumadin clinic    . zinc gluconate 50 MG tablet Take 50 mg by mouth every morning.      No current facility-administered  medications for this visit.    Facility-Administered Medications Ordered in Other Visits  Medication Dose Route Frequency Provider Last Rate Last Dose  . norepinephrine (LEVOPHED) 8 mg in dextrose 5 % 250 mL infusion  2-50 mcg/min Intravenous Titrated [name], MD         Past Medical History:  Diagnosis Date  . Anemia    LOW PLATELETS OTHER DAY  PER PT  . Anticoagulated on Coumadin 12/21/2012  . Aortic stenosis   . Arthritis    "left wrist; back sometimes" (01/21/2013)  . Atrial fibrillation ([place])    GREG Mattye Verdone, DR COOPER  . Bradycardia   . Exertional shortness of breath   . Heart murmur    "I've had it for years; runs in the family on daddy's side" (01/21/2013)  . HTN (hypertension)   . Hyperlipemia 12/21/2012  . Prostate cancer [facility])    "had 40 tx of radiation in 2009" (01/21/2013)  . S/P aortic valve replacement with bioprosthetic valve 01/19/2013   Transcatheter Aortic Valve Replacement using 69mm Sapien bioprosthetic tissue valve via transapical approach  . Type II diabetes mellitus ([place])     ROS:   All systems reviewed and negative except as  noted in the HPI.   Past Surgical History:  Procedure Laterality Date  . CARDIAC CATHETERIZATION  12/16/2012  . CARDIAC VALVE REPLACEMENT  01/19/2013   AVR  . CATARACT EXTRACTION W/ INTRAOCULAR LENS  IMPLANT, BILATERAL    . EYE SURGERY    . INTRAOPERATIVE TRANSESOPHAGEAL ECHOCARDIOGRAM N/A 01/19/2013   Procedure: INTRAOPERATIVE TRANSESOPHAGEAL ECHOCARDIOGRAM;  Surgeon: [name], MD;  Location: [place];  Service: Open Heart Surgery;  Laterality: N/A;  . ORIF FOREARM FRACTURE Left 1954   "compound fx" (01/21/2013)  . TRANSTHORACIC ECHOCARDIOGRAM  09/04/10, 09/07/08     Family History  Problem Relation Age of Onset  . Diabetes Mother   . Melanoma Mother 27  . Pancreatic cancer Father 93  . Heart disease Father   . Stroke Sister   . Cancer Brother 20       ? brain cancer  . Bone cancer Mother 41     Social  History   Social History  . Marital status: Married    Spouse name: N/A  . Number of children: N/A  . Years of education: N/A   Occupational History  . Not on file.   Social History Main Topics  . Smoking status: Former Smoker    Packs/day: 2.00    Years: 22.00    Types: Cigarettes    Quit date: 09/11/1976  . Smokeless tobacco: Never Used  . Alcohol use No  . Drug use: No  . Sexual activity: No   Other Topics Concern  . Not on file   Social History Narrative  . No narrative on file     BP 140/68   Pulse 72   Ht 5' 8.5" (1.74 m)   Wt 185 lb 9.6 oz (84.2 kg)   SpO2 96%   BMI 27.81 kg/m   Physical Exam:  Well appearing 83 yo manNAD HEENT: Unremarkable Neck:  7 cm JVD, no thyromegally Lymphatics:  No adenopathy Back:  No CVA tenderness Lungs:  Clear with rales in the bases HEART:  Regular rate rhythm, no murmurs, no rubs, no clicks Abd:  soft, positive bowel sounds, no organomegally, no rebound, no guarding Ext:  trace pulses in popliteals, 1+ peripheral edema, no cyanosis, no clubbing, hair gone from legs Skin:  No rashes no nodules Neuro:  CN II through XII intact, motor grossly intact  EKG - atrial fib with RBBB and a controlled VR.   Assess/Plan: 1. Atrial fib - his ventricular rate is controlled. No change in his meds. 2. Claudication - will obtain non-invasive ABI's of his lower extremities. I suspect he has iliac or aortic disease based on the level of his symptoms. He is not a great surgical candidate but might be a candidate for percutaneous treatment. 3. Muscle aches - we stopped his statin therapy and since then, he has had no muscle pains. 4. Chronic diastolic heart failure - his symptoms are class II. Mostly he has may legs and fatigue but does have dyspnea with exertion. He is encouraged to reduce his salt intake. He is currently taking 40 mg of Lasix daily. I considered increasing this but will hold off for now.  Cristopher Peru, M.D.

## 2017-06-27 NOTE — Patient Instructions (Addendum)
Medication Instructions:  Your physician recommends that you continue on your current medications as directed. Please refer to the Current Medication list given to you today.  Labwork: None ordered.  Testing/Procedures:  Your physician has requested that you have an ankle brachial index (ABI). During this test an ultrasound and blood pressure cuff are used to evaluate the arteries that supply the arms and legs with blood. Allow thirty minutes for this exam. There are no restrictions or special instructions.  Check ABI's bilaterally.  Please schedule for bilateral ABI's at [facility] office.   Follow-Up: Your physician wants you to follow-up in: one year with  Dr. Lovena Le.   You will receive a reminder letter in the mail two months in advance. If you don't receive a letter, please call our office to schedule the follow-up appointment.   Any Other Special Instructions Will Be Listed Below (If Applicable).    If you need a refill on your cardiac medications before your next appointment, please call your pharmacy.

## 2017-07-02 ENCOUNTER — Other Ambulatory Visit: Payer: Self-pay | Admitting: Internal Medicine

## 2017-07-02 DIAGNOSIS — I739 Peripheral vascular disease, unspecified: Secondary | ICD-10-CM

## 2017-07-03 ENCOUNTER — Ambulatory Visit (HOSPITAL_COMMUNITY)
Admission: RE | Admit: 2017-07-03 | Discharge: 2017-07-03 | Disposition: A | Discharge status: Home / Self Care | Payer: Medicare Other | Source: Ambulatory Visit | Attending: Cardiology | Admitting: Cardiology

## 2017-07-03 DIAGNOSIS — I10 Essential (primary) hypertension: Secondary | ICD-10-CM | POA: Diagnosis not present

## 2017-07-03 DIAGNOSIS — Z87891 Personal history of nicotine dependence: Secondary | ICD-10-CM | POA: Insufficient documentation

## 2017-07-03 DIAGNOSIS — I739 Peripheral vascular disease, unspecified: Secondary | ICD-10-CM | POA: Diagnosis not present

## 2017-07-03 DIAGNOSIS — E785 Hyperlipidemia, unspecified: Secondary | ICD-10-CM | POA: Insufficient documentation

## 2017-07-14 ENCOUNTER — Telehealth: Payer: Self-pay | Admitting: Internal Medicine

## 2017-07-14 DIAGNOSIS — M79604 Pain in right leg: Secondary | ICD-10-CM

## 2017-07-14 DIAGNOSIS — M79605 Pain in left leg: Principal | ICD-10-CM

## 2017-07-14 NOTE — Telephone Encounter (Signed)
New message      Calling to get ultrasound test results from last month

## 2017-07-16 NOTE — Telephone Encounter (Signed)
Call returned to Pt.  Dr. Lovena Le read ultrasound results of Pt legs and wants Pt to follow up with Dr. Fletcher Anon or Dr. [name].  Notified Pt of Dr. Tanna Furry recommendation.  Will send referral for Pt.  Pt indicates understanding.

## 2017-07-25 ENCOUNTER — Other Ambulatory Visit: Payer: Self-pay | Admitting: Internal Medicine

## 2017-08-22 ENCOUNTER — Ambulatory Visit (INDEPENDENT_AMBULATORY_CARE_PROVIDER_SITE_OTHER): Payer: Medicare Other | Admitting: Cardiovascular Disease

## 2017-08-22 ENCOUNTER — Encounter: Payer: Self-pay | Admitting: Cardiovascular Disease

## 2017-08-22 VITALS — BP 166/63 | HR 91 | Ht 68.5 in | Wt 189.0 lb

## 2017-08-22 DIAGNOSIS — I739 Peripheral vascular disease, unspecified: Secondary | ICD-10-CM | POA: Diagnosis not present

## 2017-08-22 NOTE — Progress Notes (Signed)
08/22/2017 [name]   11/19/1932  [id number]  Primary Physician [name], MD Primary Cardiologist: Lorretta Harp MD Lupe Carney, [place]  HPI:  [name] is a 83 y.o. male married, father of one living child, grandfather and 2 grandchildren with one on the way referred by Dr. Lovena Le for peripheral vascular evaluation because of right lower extremity lifestyle limiting claudication. He has a history of hypertension and hyperlipidemia. He had TAPVR performed by Dr. Burt Knack because of aortic stenosis in 2014. He also has atrial fibrillation rate controlled on Coumadin anticoagulation. He is noticed right lower extremity less limiting claudication over the last one to 2 years that begins this particular [place] cath as well. Segmental pressures were performed but were inaccurate because of medial calcification and noncompressible vessels.   Current Meds  Medication Sig  . amLODipine (NORVASC) 5 MG tablet Take 5 mg by mouth daily.   . B Complex Vitamins (B COMPLEX 50) TABS Take 1 tablet by mouth daily.   . carvedilol (COREG) 12.5 MG tablet Take 1 tablet (12.5 mg total) by mouth 2 (two) times daily.  . cholecalciferol (VITAMIN D) 1000 UNITS tablet Take 1,000 Units by mouth 2 (two) times daily.  Marland Kitchen doxazosin (CARDURA) 8 MG tablet Take 8 mg by mouth at bedtime.   . furosemide (LASIX) 40 MG tablet TAKE ONE TABLET BY MOUTH ONCE DAILY  . glipiZIDE (GLUCOTROL) 5 MG tablet Take 5 mg by mouth 2 (two) times daily before a meal.   . lisinopril (PRINIVIL,ZESTRIL) 20 MG tablet Take 1 tablet (20 mg total) by mouth daily. Patient needs to keep 09/03/17 appointment for further refills  . metFORMIN (GLUCOPHAGE) 1000 MG tablet Take 1,000 mg by mouth 2 (two) times daily with a meal.   . Potassium Gluconate 595 MG CAPS Take 595 mg by mouth 2 (two) times daily.  . Selenium 200 MCG CAPS Take 200 mcg by mouth every morning.   . vitamin A 8000 UNIT capsule Take 8,000 Units by mouth every  morning.  . vitamin B-12 (CYANOCOBALAMIN) 1000 MCG tablet Take 1,000 mcg by mouth 2 (two) times daily.  . vitamin C (ASCORBIC ACID) 500 MG tablet Take 500 mg by mouth 2 (two) times daily.   Marland Kitchen warfarin (COUMADIN) 7.5 MG tablet Take 7.5 mg by mouth daily. Take as directed by the coumadin clinic  . zinc gluconate 50 MG tablet Take 50 mg by mouth every morning.      No Known Allergies  Social History   Social History  . Marital status: Married    Spouse name: N/A  . Number of children: N/A  . Years of education: N/A   Occupational History  . Not on file.   Social History Main Topics  . Smoking status: Former Smoker    Packs/day: 2.00    Years: 22.00    Types: Cigarettes    Quit date: 09/11/1976  . Smokeless tobacco: Never Used  . Alcohol use No  . Drug use: No  . Sexual activity: No   Other Topics Concern  . Not on file   Social History Narrative  . No narrative on file     Review of Systems: General: negative for chills, fever, night sweats or weight changes.  Cardiovascular: negative for chest pain, dyspnea on exertion, edema, orthopnea, palpitations, paroxysmal nocturnal dyspnea or shortness of breath Dermatological: negative for rash Respiratory: negative for cough or wheezing Urologic: negative for hematuria Abdominal: negative for nausea, vomiting, diarrhea, bright  red blood per rectum, melena, or hematemesis Neurologic: negative for visual changes, syncope, or dizziness All other systems reviewed and are otherwise negative except as noted above.    Blood pressure (!) 166/63, pulse 91, height 5' 8.5" (1.74 m), weight 189 lb (85.7 kg), SpO2 98 %.  General appearance: alert and no distress Neck: no adenopathy, no carotid bruit, no JVD, supple, symmetrical, trachea midline and thyroid not enlarged, symmetric, no tenderness/mass/nodules Lungs: clear to auscultation bilaterally Heart: irregularly irregular rhythm Extremities: extremities normal, atraumatic, no  cyanosis or edema Pulses: Diminished right pedal pulses Skin: Skin color, texture, turgor normal. No rashes or lesions Neurologic: Alert and oriented X 3, normal strength and tone. Normal symmetric reflexes. Normal coordination and gait  EKG not performed today.  ASSESSMENT AND PLAN:   Claudication in peripheral vascular disease [facility]) Mr. Hendriks was referred to me by Dr. Lovena Le for symptomatic right lower extremity claudication. This is been on for approximately a year. Dopplers performed 07/03/17 revealed fairly normal TBI's bilaterally with noncompressible vessels suggesting medial calcification. He does have 1+ femoral pulses, markedly diminished right pedal pulses and palpable left pedal pulses. I am going to obtain lower extremity Doppler studies to further evaluate.      Lorretta Harp MD FACP,FACC,FAHA, [facility] 08/22/2017 10:08 AM

## 2017-08-22 NOTE — Patient Instructions (Signed)
Medication Instructions: Your physician recommends that you continue on your current medications as directed. Please refer to the Current Medication list given to you today.   Testing/Procedures: Your physician has requested that you have a lower extremity arterial duplex. During this test, ultrasound is used to evaluate arterial blood flow in the legs. Allow one hour for this exam. There are no restrictions or special instructions.  Your physician has requested that you have an ankle brachial index (ABI). During this test an ultrasound and blood pressure cuff are used to evaluate the arteries that supply the arms and legs with blood. Allow thirty minutes for this exam. There are no restrictions or special instructions.  Follow-Up: Your physician recommends that you schedule a follow-up appointment with Dr. [name] if test results are abnormal.

## 2017-08-22 NOTE — Assessment & Plan Note (Signed)
[name] was referred to me by Dr. Lovena Le for symptomatic right lower extremity claudication. This is been on for approximately a year. Dopplers performed 07/03/17 revealed fairly normal TBI's bilaterally with noncompressible vessels suggesting medial calcification. He does have 1+ femoral pulses, markedly diminished right pedal pulses and palpable left pedal pulses. I am going to obtain lower extremity Doppler studies to further evaluate.

## 2017-09-02 ENCOUNTER — Other Ambulatory Visit: Payer: Self-pay | Admitting: Internal Medicine

## 2017-09-02 NOTE — Telephone Encounter (Signed)
This is Dr. Berry's pt 

## 2017-09-03 ENCOUNTER — Ambulatory Visit: Payer: Medicare Other | Admitting: Internal Medicine

## 2017-09-03 NOTE — Telephone Encounter (Signed)
REFILL 

## 2017-09-12 ENCOUNTER — Inpatient Hospital Stay (HOSPITAL_COMMUNITY): Admission: RE | Admit: 2017-09-12 | Payer: Medicare Other | Source: Ambulatory Visit

## 2017-09-29 ENCOUNTER — Ambulatory Visit (HOSPITAL_COMMUNITY)
Admission: RE | Admit: 2017-09-29 | Discharge: 2017-09-29 | Disposition: A | Discharge status: Home / Self Care | Payer: Medicare Other | Source: Ambulatory Visit | Attending: Cardiology | Admitting: Cardiology

## 2017-09-29 DIAGNOSIS — I739 Peripheral vascular disease, unspecified: Secondary | ICD-10-CM | POA: Diagnosis not present

## 2017-09-29 DIAGNOSIS — I70201 Unspecified atherosclerosis of native arteries of extremities, right leg: Secondary | ICD-10-CM | POA: Insufficient documentation

## 2017-10-03 ENCOUNTER — Telehealth: Payer: Self-pay | Admitting: Cardiovascular Disease

## 2017-10-03 ENCOUNTER — Encounter: Payer: Self-pay | Admitting: Podiatry

## 2017-10-03 ENCOUNTER — Ambulatory Visit: Payer: Medicare Other | Admitting: Podiatry

## 2017-10-03 DIAGNOSIS — B351 Tinea unguium: Secondary | ICD-10-CM | POA: Diagnosis not present

## 2017-10-03 DIAGNOSIS — M79674 Pain in right toe(s): Secondary | ICD-10-CM | POA: Diagnosis not present

## 2017-10-03 DIAGNOSIS — E1149 Type 2 diabetes mellitus with other diabetic neurological complication: Secondary | ICD-10-CM | POA: Diagnosis not present

## 2017-10-03 DIAGNOSIS — M79675 Pain in left toe(s): Secondary | ICD-10-CM | POA: Diagnosis not present

## 2017-10-03 DIAGNOSIS — L600 Ingrowing nail: Secondary | ICD-10-CM

## 2017-10-03 NOTE — Telephone Encounter (Signed)
Aaron Harp, MD  [name], [place]   Previous Messages        Result Notes for VAS [place] LOWER EXTREMITY ARTERIAL DUPLEX   Notes recorded by [name] on 10/03/2017 at 10:08 AM EST lmtcb with pt wife to schedule f/u appt with Dr. [name]. ------  Notes recorded by [name] on 10/01/2017 at 1:55 PM EST Pt last appt with you was 10/19, did not see you on 11/27. Want pt scheduled with you to discuss these results? ------  Notes recorded by Aaron Harp, MD on 09/30/2017 at 1:41 PM EST Patient has appointment with me today.

## 2017-10-03 NOTE — Telephone Encounter (Signed)
New message    Patient calling for VAS [place] LOWER EXTREMITY ARTERIAL DUPLEX  Results. Please call

## 2017-10-03 NOTE — Progress Notes (Signed)
   Subjective:    Patient ID: [name], male    DOB: 11-14-1932, 83 y.o.   MRN: [id number]  HPI  83 year old male presents the office today for concerns of bilateral hallux toenail thickening and pain.  He states the left side is been very thick and discolored for the last year and on the right side he is starting to get an ingrown toenail due to thickening.  There is a painful with pressure in shoes.  He tries to trim the nails himself.  No redness or drainage or any swelling.  He has no other concerns today.  He is diabetic and his last morning glucose was 127 his last A1c was 6.9.  Denies any claudication symptoms.  He does have numbness and tingling.  He has no other concerns today.  He is on Coumadin.  Review of Systems  All other systems reviewed and are negative.      Objective:   Physical Exam General: AAO x3, NAD  Dermatological: Bilateral hallux toenails to be hypertrophic, dystrophic, discolored with the left side worse than the right.  There is significant dystrophy of the toenails.  There is incurvation along both the medial lateral aspects of bilateral hallux nails there is incurvation on the right hallux toenail.  There is no edema, erythema, drainage or pus there is no clinical signs of infection noted today.  There is no open lesions or pre-ulcerative lesions.  Vascular: Dorsalis DP pulses 2/4, PT pulse 1/4, mild bilateral chronic edema present bilateral. There is no pain with calf compression, swelling, warmth, erythema.   Neruologic: Sensation decreased with Derrel Nip monofilament  Musculoskeletal: No gross boney pedal deformities bilateral. No pain, crepitus, or limitation noted with foot and ankle range of motion bilateral. Muscular strength 5/5 in all groups tested bilateral.  Gait: Unassisted, Nonantalgic.      Assessment & Plan:  83 year old male with bilateral hallux onychodystrophy, onychomycosis with ingrowing of the nail. -Treatment options  discussed including all alternatives, risks, and complications -Etiology of symptoms were discussed -Discussed the nail avulsion but given his decreased PT pulse although could be due to swelling as well as history of Coumadin will start with conservative treatment.  I debrided bilateral hallux toenails without any complication or bleeding to patient comfort.  After debridement there was no further pain. -Discussed daily foot inspection -Recent ABI with Dr. [name] -Follow-up in 3 months or sooner if any issues are to arise.  Trula Slade DPM

## 2017-10-09 NOTE — Telephone Encounter (Signed)
Called pt-- scheduled appt for pt on 12/11 at 10 AM to discuss doppler results per Dr. [name].

## 2017-10-14 ENCOUNTER — Ambulatory Visit: Payer: Medicare Other | Admitting: Cardiovascular Disease

## 2017-10-21 ENCOUNTER — Ambulatory Visit: Payer: Medicare Other | Admitting: Cardiovascular Disease

## 2017-10-21 ENCOUNTER — Encounter: Payer: Self-pay | Admitting: Cardiovascular Disease

## 2017-10-21 VITALS — BP 136/60 | HR 84 | Ht 68.5 in | Wt 191.0 lb

## 2017-10-21 DIAGNOSIS — I739 Peripheral vascular disease, unspecified: Secondary | ICD-10-CM

## 2017-10-21 DIAGNOSIS — R0989 Other specified symptoms and signs involving the circulatory and respiratory systems: Secondary | ICD-10-CM | POA: Diagnosis not present

## 2017-10-21 NOTE — Patient Instructions (Addendum)
Medication Instructions: Your physician recommends that you continue on your current medications as directed. Please refer to the Current Medication list given to you today.   Testing/Procedures: Your physician has requested that you have a carotid duplex. This test is an ultrasound of the carotid arteries in your neck. It looks at blood flow through these arteries that supply the brain with blood. Allow one hour for this exam. There are no restrictions or special instructions.   Your physician has requested that you have a aorta and iliac duplex. During this test, an ultrasound is used to evaluate blood flow to the aorta and iliac arteries. Allow one hour for this exam. Do not eat after midnight the day before and avoid carbonated beverages.    Follow-Up: Your physician recommends that you schedule a follow-up appointment as needed with Dr. [name].

## 2017-10-21 NOTE — Progress Notes (Signed)
10/21/2017 [name]   10-10-1933  [id number]  Primary Physician [name], MD Primary Cardiologist: Lorretta Harp MD Lupe Carney, [place]  HPI:  [name] is a 83 y.o.  married, father of one living child, grandfather and 2 grandchildren with one on the way referred by Dr. Lovena Le for peripheral vascular evaluation because of right lower extremity lifestyle limiting claudication. I last saw him in the office 08/22/17 He has a history of hypertension and hyperlipidemia. He had TAPVR performed by Dr. Burt Knack because of aortic stenosis in 2014. He also has atrial fibrillation rate controlled on Coumadin anticoagulation. He is noticed right lower extremity less limiting claudication over the last one to 2 years that begins this particular [place] cath as well. Segmental pressures were performed but were inaccurate because of medial calcification and noncompressible vessels. He had lower summary arterial Doppler studies performed 09/30/17 revealing moderate obstructive disease in his right common femoral artery but otherwise no significant high-grade disease and biphasic waveforms below. His symptoms sound somewhat atypical and may be multifactorial related to her back and /or peripheral neuropathy.  Current Meds  Medication Sig  . amLODipine (NORVASC) 5 MG tablet Take 5 mg by mouth daily.   . B Complex Vitamins (B COMPLEX 50) TABS Take 1 tablet by mouth daily.   . carvedilol (COREG) 12.5 MG tablet Take 1 tablet (12.5 mg total) by mouth 2 (two) times daily.  . cholecalciferol (VITAMIN D) 1000 UNITS tablet Take 1,000 Units by mouth 2 (two) times daily.  Marland Kitchen doxazosin (CARDURA) 8 MG tablet Take 8 mg by mouth at bedtime.   Marland Kitchen glipiZIDE (GLUCOTROL) 5 MG tablet Take 5 mg by mouth 2 (two) times daily before a meal.   . lisinopril (PRINIVIL,ZESTRIL) 20 MG tablet Take 1 tablet (20 mg total) by mouth daily. KEEP OV.  . metFORMIN (GLUCOPHAGE) 1000 MG tablet Take 1,000 mg by mouth 2  (two) times daily with a meal.   . Potassium Gluconate 595 MG CAPS Take 595 mg by mouth 2 (two) times daily.  . Selenium 200 MCG CAPS Take 200 mcg by mouth every morning.   . vitamin A 8000 UNIT capsule Take 8,000 Units by mouth every morning.  . vitamin B-12 (CYANOCOBALAMIN) 1000 MCG tablet Take 1,000 mcg by mouth 2 (two) times daily.  . vitamin C (ASCORBIC ACID) 500 MG tablet Take 500 mg by mouth 2 (two) times daily.   Marland Kitchen warfarin (COUMADIN) 7.5 MG tablet Take 7.5 mg by mouth daily. Take as directed by the coumadin clinic  . zinc gluconate 50 MG tablet Take 50 mg by mouth every morning.      No Known Allergies  Social History   Socioeconomic History  . Marital status: Married    Spouse name: Not on file  . Number of children: Not on file  . Years of education: Not on file  . Highest education level: Not on file  Social Needs  . Financial resource strain: Not on file  . Food insecurity - worry: Not on file  . Food insecurity - inability: Not on file  . Transportation needs - medical: Not on file  . Transportation needs - non-medical: Not on file  Occupational History  . Not on file  Tobacco Use  . Smoking status: Former Smoker    Packs/day: 2.00    Years: 22.00    Pack years: 44.00    Types: Cigarettes    Last attempt to quit: 09/11/1976  Years since quitting: 41.1  . Smokeless tobacco: Never Used  Substance and Sexual Activity  . Alcohol use: No  . Drug use: No  . Sexual activity: No  Other Topics Concern  . Not on file  Social History Narrative  . Not on file     Review of Systems: General: negative for chills, fever, night sweats or weight changes.  Cardiovascular: negative for chest pain, dyspnea on exertion, edema, orthopnea, palpitations, paroxysmal nocturnal dyspnea or shortness of breath Dermatological: negative for rash Respiratory: negative for cough or wheezing Urologic: negative for hematuria Abdominal: negative for nausea, vomiting, diarrhea, bright  red blood per rectum, melena, or hematemesis Neurologic: negative for visual changes, syncope, or dizziness All other systems reviewed and are otherwise negative except as noted above.    Blood pressure 136/60, pulse 84, height 5' 8.5" (1.74 m), weight 191 lb (86.6 kg).  General appearance: alert and no distress Neck: no adenopathy, no JVD, supple, symmetrical, trachea midline, thyroid not enlarged, symmetric, no tenderness/mass/nodules and Bilateral carotid bruits left lateral Lungs: clear to auscultation bilaterally Heart: irregularly irregular rhythm Extremities: 1+ pitting edema bilaterally Pulses: 1+ right pedal pulses Skin: Venous stasis changes Neurologic: Alert and oriented X 3, normal strength and tone. Normal symmetric reflexes. Normal coordination and gait  EKG not performed today  ASSESSMENT AND PLAN:   Claudication in peripheral vascular disease [facility]) Mr. Sahagun returns today for follow-up of his Doppler studies performed in the evaluation of claudication. These were done on 09/30/17 which revealed moderate plaque in the right common femoral artery but otherwise no significant obstruction and biphasic waveforms below. His symptoms are somewhat atypical and they sound like they may be multifactorial related to back issues and peripheral neuropathy. I am going to get aortoiliac Dopplers to rule out high-grade disease above the inguinal ligament ligament are normal I will see him back as needed.      Lorretta Harp MD FACP,FACC,FAHA, [facility] 10/21/2017 8:56 AM

## 2017-10-21 NOTE — Assessment & Plan Note (Signed)
[name] returns today for follow-up of his Doppler studies performed in the evaluation of claudication. These were done on 09/30/17 which revealed moderate plaque in the right common femoral artery but otherwise no significant obstruction and biphasic waveforms below. His symptoms are somewhat atypical and they sound like they may be multifactorial related to back issues and peripheral neuropathy. I am going to get aortoiliac Dopplers to rule out high-grade disease above the inguinal ligament ligament are normal I will see him back as needed.

## 2017-10-22 ENCOUNTER — Other Ambulatory Visit: Payer: Self-pay | Admitting: Cardiovascular Disease

## 2017-10-22 DIAGNOSIS — R0989 Other specified symptoms and signs involving the circulatory and respiratory systems: Secondary | ICD-10-CM

## 2017-10-22 DIAGNOSIS — I739 Peripheral vascular disease, unspecified: Secondary | ICD-10-CM

## 2017-11-18 ENCOUNTER — Ambulatory Visit (HOSPITAL_COMMUNITY)
Admission: RE | Admit: 2017-11-18 | Discharge: 2017-11-18 | Disposition: A | Discharge status: Home / Self Care | Payer: Medicare Other | Source: Ambulatory Visit | Attending: Cardiovascular Disease | Admitting: Cardiovascular Disease

## 2017-11-18 ENCOUNTER — Ambulatory Visit (HOSPITAL_BASED_OUTPATIENT_CLINIC_OR_DEPARTMENT_OTHER)
Admission: RE | Admit: 2017-11-18 | Discharge: 2017-11-18 | Disposition: A | Discharge status: Home / Self Care | Payer: Medicare Other | Source: Ambulatory Visit | Attending: Cardiovascular Disease | Admitting: Cardiovascular Disease

## 2017-11-18 DIAGNOSIS — I6523 Occlusion and stenosis of bilateral carotid arteries: Secondary | ICD-10-CM | POA: Diagnosis not present

## 2017-11-18 DIAGNOSIS — I739 Peripheral vascular disease, unspecified: Secondary | ICD-10-CM | POA: Insufficient documentation

## 2017-11-18 DIAGNOSIS — R0989 Other specified symptoms and signs involving the circulatory and respiratory systems: Secondary | ICD-10-CM

## 2017-11-21 ENCOUNTER — Other Ambulatory Visit: Payer: Self-pay | Admitting: Cardiovascular Disease

## 2017-11-21 DIAGNOSIS — R0989 Other specified symptoms and signs involving the circulatory and respiratory systems: Secondary | ICD-10-CM

## 2018-01-05 ENCOUNTER — Ambulatory Visit: Payer: Medicare Other | Admitting: Podiatry

## 2018-01-06 ENCOUNTER — Ambulatory Visit: Payer: Medicare Other | Admitting: Podiatry

## 2018-01-06 ENCOUNTER — Encounter: Payer: Self-pay | Admitting: Podiatry

## 2018-01-06 DIAGNOSIS — L600 Ingrowing nail: Secondary | ICD-10-CM

## 2018-01-06 DIAGNOSIS — B351 Tinea unguium: Secondary | ICD-10-CM

## 2018-01-06 DIAGNOSIS — M79674 Pain in right toe(s): Secondary | ICD-10-CM | POA: Diagnosis not present

## 2018-01-06 DIAGNOSIS — M79675 Pain in left toe(s): Secondary | ICD-10-CM | POA: Diagnosis not present

## 2018-01-06 NOTE — Progress Notes (Addendum)
Complaint:  Visit Type: Patient returns to my office for continued preventative foot care services. Complaint: Patient states" my nails have grown long and thick and become painful to walk and wear shoes" Patient has been diagnosed with DM with no foot complications. The patient presents for preventative foot care services. No changes to ROS.  Patient has severe pain along the outside border right great toenail.  Patient is taking coumadin.  Podiatric Exam: Vascular: dorsalis pedis and posterior tibial pulses are palpable bilateral. Capillary return is immediate. Temperature gradient is WNL. Skin turgor WNL  Sensorium: Diminished  Semmes Weinstein monofilament test. Normal tactile sensation bilaterally. Nail Exam: Pt has thick disfigured discolored nails with subungual debris noted bilateral entire nail hallux through fifth toenails.  Redness and swelling noted distal lateral nail groove right hallux. Ulcer Exam: There is no evidence of ulcer or pre-ulcerative changes or infection. Orthopedic Exam: Muscle tone and strength are WNL. No limitations in general ROM. No crepitus or effusions noted. Foot type and digits show no abnormalities. Bony prominences are unremarkable. Skin: No Porokeratosis. No infection or ulcers  Diagnosis:  Onychomycosis, , Pain in right toe, pain in left toes Paronychia right hallyx.  Treatment & Plan Procedures and Treatment: Consent by patient was obtained for treatment procedures.   Debridement of mycotic and hypertrophic toenails, 1 through 5 bilateral and clearing of subungual debris. No ulceration, no infection noted. Incision and drainage paronychia right hallux. DSD applied.  Peroxide at home. Return Visit-Office Procedure: Patient instructed to return to the office for a follow up visit 10 weeks  for continued evaluation and treatment.    Gardiner Barefoot DPM

## 2018-03-12 ENCOUNTER — Other Ambulatory Visit: Payer: Self-pay | Admitting: Internal Medicine

## 2018-03-18 ENCOUNTER — Ambulatory Visit (INDEPENDENT_AMBULATORY_CARE_PROVIDER_SITE_OTHER): Payer: Medicare Other | Admitting: Ophthalmology

## 2018-03-18 DIAGNOSIS — E11319 Type 2 diabetes mellitus with unspecified diabetic retinopathy without macular edema: Secondary | ICD-10-CM

## 2018-03-18 DIAGNOSIS — I10 Essential (primary) hypertension: Secondary | ICD-10-CM

## 2018-03-18 DIAGNOSIS — H35033 Hypertensive retinopathy, bilateral: Secondary | ICD-10-CM

## 2018-03-18 DIAGNOSIS — E113212 Type 2 diabetes mellitus with mild nonproliferative diabetic retinopathy with macular edema, left eye: Secondary | ICD-10-CM | POA: Diagnosis not present

## 2018-03-18 DIAGNOSIS — H43813 Vitreous degeneration, bilateral: Secondary | ICD-10-CM

## 2018-03-18 DIAGNOSIS — H33302 Unspecified retinal break, left eye: Secondary | ICD-10-CM | POA: Diagnosis not present

## 2018-03-20 ENCOUNTER — Ambulatory Visit: Payer: Medicare Other | Admitting: Podiatry

## 2018-03-20 ENCOUNTER — Encounter: Payer: Self-pay | Admitting: Podiatry

## 2018-03-20 DIAGNOSIS — D689 Coagulation defect, unspecified: Secondary | ICD-10-CM | POA: Diagnosis not present

## 2018-03-20 DIAGNOSIS — M79675 Pain in left toe(s): Secondary | ICD-10-CM

## 2018-03-20 DIAGNOSIS — B351 Tinea unguium: Secondary | ICD-10-CM

## 2018-03-20 DIAGNOSIS — E1149 Type 2 diabetes mellitus with other diabetic neurological complication: Secondary | ICD-10-CM | POA: Diagnosis not present

## 2018-03-20 DIAGNOSIS — M79674 Pain in right toe(s): Secondary | ICD-10-CM

## 2018-03-20 NOTE — Progress Notes (Signed)
Complaint:  Visit Type: Patient returns to my office for continued preventative foot care services. Complaint: Patient states" my nails have grown long and thick and become painful to walk and wear shoes" Patient has been diagnosed with DM with no foot complications. The patient presents for preventative foot care services. No changes to ROS.    Patient is taking coumadin.  Podiatric Exam: Vascular: dorsalis pedis and posterior tibial pulses are palpable bilateral. Capillary return is immediate. Temperature gradient is WNL. Skin turgor WNL  Sensorium: Diminished  Semmes Weinstein monofilament test. Normal tactile sensation bilaterally. Nail Exam: Pt has thick disfigured discolored nails with subungual debris noted bilateral entire nail hallux through fifth toenails.  Pincer nail right hallux. Ulcer Exam: There is no evidence of ulcer or pre-ulcerative changes or infection. Orthopedic Exam: Muscle tone and strength are WNL. No limitations in general ROM. No crepitus or effusions noted. Foot type and digits show no abnormalities. Bony prominences are unremarkable. Skin: No Porokeratosis. No infection or ulcers  Diagnosis:  Onychomycosis, , Pain in right toe, pain in left toes  Treatment & Plan Procedures and Treatment: Consent by patient was obtained for treatment procedures.   Debridement of mycotic and hypertrophic toenails, 1 through 5 bilateral and clearing of subungual debris. No ulceration, no infection noted.  Return Visit-Office Procedure: Patient instructed to return to the office for a follow up visit 10 weeks  for continued evaluation and treatment.    Gardiner Barefoot DPM

## 2018-05-14 ENCOUNTER — Other Ambulatory Visit: Payer: Self-pay | Admitting: Internal Medicine

## 2018-05-29 ENCOUNTER — Ambulatory Visit: Payer: Medicare Other | Admitting: Podiatry

## 2018-05-29 ENCOUNTER — Encounter: Payer: Self-pay | Admitting: Podiatry

## 2018-05-29 DIAGNOSIS — B351 Tinea unguium: Secondary | ICD-10-CM | POA: Diagnosis not present

## 2018-05-29 DIAGNOSIS — M79674 Pain in right toe(s): Secondary | ICD-10-CM | POA: Diagnosis not present

## 2018-05-29 DIAGNOSIS — M79675 Pain in left toe(s): Secondary | ICD-10-CM

## 2018-05-29 DIAGNOSIS — L600 Ingrowing nail: Secondary | ICD-10-CM | POA: Diagnosis not present

## 2018-05-31 NOTE — Progress Notes (Signed)
Subjective: [name]  Is an 84 y.o. Male who presents today with painful, discolored, thick toenails which interfere with daily activities and routine tasks.   Most symptomatic today is his right great toe lateral border which he suspects is an ingrown toenail. He relates he has been applying a topical compound made of pine from his local retailer.  Pain is aggravated when wearing enclosed shoe gear.   Pain is getting progressively worse.  He is on blood thinner, coumadin.  Objective:  Vascular Examination: Capillary refill time immediate x 10 Dorsalis pedis and posterior tibial pulses present b/l No digital hair x 10 digits Skin temperature warm to warm b/l  Dermatological Examination: Skin with normal turgor, texture and tone b/l Toenails 1-5 b/l discolored, thick, elongated with subungual debris.    Right great toe nail plate with pincer nail deformity:  incurvated at medial and lateral borders with tenderness to palpation lateral border. Mild nail border hypertrophy. No purulence. Medial border noted to be incurvated as well with nail border hypertrophy.   Musculoskeletal: Muscle strength 5/5 to all LE muscle groups  Neurological: Sensation intact with 10 gram monofilament. Vibratory sensation intact.  Assessment: 1. Painful ingrown toenail right great toe (pincer nail) 2. Painful onychomycosis nails 1-5 b/l  Plan: 1. Pt declined local injection on today. 2. Offending nail border debrided with nail nipper and borders curretaged. Borders cleansed with alcohol. Bleeding controlled with Lumicain Hemostatic solution. Silvadene Cream and light Coban dressing applied to digit. Capillary refill time assessed and noted to be adequate after dressing applied.   3. He was instructed to keep dressing on today and start epsom salt soaks tomorrow. Patient given soaking instructions to soak twice daily for 1 week with epsom salt and warm water. 4. Patient to continue soft,  supportive shoe gear to tolerance. 5. Patient to report any worsening sypmtoms to our office immediately. 6. Follow up 1 week. 7. Patient/POA to call should there be a concern in the interim.

## 2018-06-05 ENCOUNTER — Ambulatory Visit (INDEPENDENT_AMBULATORY_CARE_PROVIDER_SITE_OTHER): Payer: Medicare Other | Admitting: Podiatry

## 2018-06-05 ENCOUNTER — Encounter: Payer: Self-pay | Admitting: Podiatry

## 2018-06-05 DIAGNOSIS — L600 Ingrowing nail: Secondary | ICD-10-CM

## 2018-06-07 NOTE — Progress Notes (Signed)
Subjective: [name] is here for follow up of ingrown toenail right great toe. He states he still has a little soreness, but it is overall better. He is now able to wear a shoe. He says he is applying Apinol to the toe which helps the soreness.   Objective:  Vascular Examination: Capillary refill time immediate x 10 Dorsalis pedis and posterior tibial pulses present b/l No digital hair x 10 digits Skin temperature warm to warm b/l  Dermatological Examination: Skin with normal turgor, texture and tone b/l Toenails 1-5 b/l discolored, thick, elongated with subungual debris.    Right great toe nail plate with pincer nail deformity:  Evidence of my debridement/curretage from last week. No erythema, no edema, no drainage. Mild dried serosanguinous drainage noted subungually. No purulence expressed.  Musculoskeletal: Muscle strength 5/5 to all LE muscle groups  Neurological: Sensation intact with 10 gram monofilament. Vibratory sensation intact.  Assessment: Ingrown toenail right great toe, noninfected. Doing well. Pt on longterm blood thinner  Plan: Right great toe was curretaged of any dried drainage. Silvadene Cream and bandaid applied. 2. Continue soft, supportive shoe gear daily. 3. Report any pedal injuries immediately to a medical professional. 4. Follow up 9 weeks for diabetic foot care.

## 2018-06-26 ENCOUNTER — Ambulatory Visit: Payer: Medicare Other | Admitting: Internal Medicine

## 2018-06-26 ENCOUNTER — Encounter: Payer: Self-pay | Admitting: Internal Medicine

## 2018-06-26 VITALS — BP 140/66 | HR 80 | Ht 68.0 in | Wt 189.4 lb

## 2018-06-26 DIAGNOSIS — I482 Chronic atrial fibrillation, unspecified: Secondary | ICD-10-CM

## 2018-06-26 DIAGNOSIS — E785 Hyperlipidemia, unspecified: Secondary | ICD-10-CM

## 2018-06-26 DIAGNOSIS — I5032 Chronic diastolic (congestive) heart failure: Secondary | ICD-10-CM

## 2018-06-26 DIAGNOSIS — I739 Peripheral vascular disease, unspecified: Secondary | ICD-10-CM | POA: Diagnosis not present

## 2018-06-26 MED ORDER — ROSUVASTATIN CALCIUM 5 MG PO TABS: 5.0000 mg | ORAL_TABLET | Freq: Every day | ORAL | 3 refills | Status: DC

## 2018-06-26 NOTE — Patient Instructions (Addendum)
Medication Instructions:  Your physician has recommended you make the following change in your medication:  1.  Start taking Crestor 5 mg one tablet by mouth daily.  Labwork: You will come to [facility] office on August 17, 2018 for lab work.  Please DO NOT eat before this blood work.  You can come in any time after 8:00 am.  Testing/Procedures: None ordered.  Follow-Up: Your physician wants you to follow-up in: one year with Dr. Lovena Le.   You will receive a reminder letter in the mail two months in advance. If you don't receive a letter, please call our office to schedule the follow-up appointment.  Any Other Special Instructions Will Be Listed Below (If Applicable).  If you need a refill on your cardiac medications before your next appointment, please call your pharmacy.

## 2018-06-26 NOTE — Progress Notes (Signed)
HPI [name] returns today for follow-up. He is an 84 year old man with a [name] of aortic stenosis, atrial fibrillation, chronic diastolic heart failure, status post TAVR.  He has undergone treatment for prostate CA and he is up multiple times at night to urinate.  He c/o pain in his back but no claudication No chest pain. His arms bruise easily. No Known Allergies   Current Outpatient Medications  Medication Sig Dispense Refill  . ACCU-CHEK AVIVA PLUS test strip USE TO CHECK BLOOD SUGARS 1 TO 2 TIMES A DAY  3  . ACCU-CHEK SOFTCLIX LANCETS lancets daily. use as directed  3  . amLODipine (NORVASC) 5 MG tablet Take 5 mg by mouth daily.     . B Complex Vitamins (B COMPLEX 50) TABS Take 1 tablet by mouth daily.     . carvedilol (COREG) 12.5 MG tablet Take 1 tablet (12.5 mg total) by mouth 2 (two) times daily. 180 tablet 3  . cholecalciferol (VITAMIN D) 1000 UNITS tablet Take 1,000 Units by mouth 2 (two) times daily.    Marland Kitchen doxazosin (CARDURA) 8 MG tablet Take 8 mg by mouth at bedtime.     . furosemide (LASIX) 40 MG tablet TAKE 1 TABLET BY MOUTH ONCE DAILY 90 tablet 3  . glipiZIDE (GLUCOTROL) 5 MG tablet Take 5 mg by mouth 2 (two) times daily before a meal.     . ketoconazole (NIZORAL) 2 % cream APPLY TO BACK SIDE TWICE DAILY AS NEEDED  3  . lisinopril (PRINIVIL,ZESTRIL) 20 MG tablet TAKE 1 TABLET BY MOUTH ONCE DAILY 90 tablet 0  . metFORMIN (GLUCOPHAGE) 1000 MG tablet Take 1,000 mg by mouth 2 (two) times daily with a meal.     . Potassium Gluconate 595 MG CAPS Take 595 mg by mouth 2 (two) times daily.    . Selenium 200 MCG CAPS Take 200 mcg by mouth every morning.     . vitamin A 8000 UNIT capsule Take 8,000 Units by mouth every morning.    . vitamin B-12 (CYANOCOBALAMIN) 1000 MCG tablet Take 1,000 mcg by mouth 2 (two) times daily.    . vitamin C (ASCORBIC ACID) 500 MG tablet Take 500 mg by mouth 2 (two) times daily.     Marland Kitchen warfarin (COUMADIN) 7.5 MG tablet Take 7.5 mg by mouth  daily. Take as directed by the coumadin clinic    . zinc gluconate 50 MG tablet Take 50 mg by mouth every morning.      No current facility-administered medications for this visit.    Facility-Administered Medications Ordered in Other Visits  Medication Dose Route Frequency Provider Last Rate Last Dose  . norepinephrine (LEVOPHED) 8 mg in dextrose 5 % 250 mL infusion  2-50 mcg/min Intravenous Titrated [name]:  Diagnosis Date  . Anemia    LOW PLATELETS OTHER DAY  PER PT  . Anticoagulated on Coumadin 12/21/2012  . Aortic stenosis   . Arthritis    "left wrist; back sometimes" (01/21/2013)  . Atrial fibrillation ([name])    [name], [name]  . Bradycardia   . Exertional shortness of breath   . Heart murmur    "I've had it for years; runs in the family on daddy's side" (01/21/2013)  . HTN (hypertension)   . Hyperlipemia 12/21/2012  . Prostate cancer [name])    "had 40 tx of radiation in 2009" (01/21/2013)  . S/P aortic valve  replacement with bioprosthetic valve 01/19/2013   Transcatheter Aortic Valve Replacement using 45mm Sapien bioprosthetic tissue valve via transapical approach  . Type II diabetes mellitus (HCC)     ROS:   All systems reviewed and negative except as noted in the HPI.   Past Surgical [name]:  Procedure Laterality Date  . CARDIAC CATHETERIZATION  12/16/2012  . CARDIAC VALVE REPLACEMENT  01/19/2013   AVR  . CATARACT EXTRACTION W/ INTRAOCULAR LENS  IMPLANT, BILATERAL    . EYE SURGERY    . INTRAOPERATIVE TRANSESOPHAGEAL ECHOCARDIOGRAM N/A 01/19/2013   Procedure: INTRAOPERATIVE TRANSESOPHAGEAL ECHOCARDIOGRAM;  Surgeon: [name];  Location: [name];  Service: Open Heart Surgery;  Laterality: N/A;  . ORIF FOREARM FRACTURE Left 1954   "compound fx" (01/21/2013)  . TRANSTHORACIC ECHOCARDIOGRAM  09/04/10, 09/07/08     Family [name]  Problem Relation Age of Onset  . Diabetes Mother   . Melanoma Mother 34  . Bone  cancer Mother 23  . Pancreatic cancer Father 38  . Heart disease Father   . Stroke Sister   . Cancer Brother 20       ? brain cancer     Social [name]   Socioeconomic [name]  . Marital status: Married    Spouse name: Not on file  . Number of children: Not on file  . Years of education: Not on file  . Highest education level: Not on file  Occupational [name]  . Not on file  Social Needs  . Financial resource strain: Not on file  . Food insecurity:    Worry: Not on file    Inability: Not on file  . Transportation needs:    Medical: Not on file    Non-medical: Not on file  Tobacco Use  . Smoking status: Former Smoker    Packs/day: 2.00    Years: 22.00    Pack years: 44.00    Types: Cigarettes    Last attempt to quit: 09/11/1976    Years since quitting: 41.8  . Smokeless tobacco: Never Used  Substance and Sexual Activity  . Alcohol use: No  . Drug use: No  . Sexual activity: Never  Lifestyle  . Physical activity:    Days per week: Not on file    Minutes per session: Not on file  . Stress: Not on file  Relationships  . Social connections:    Talks on phone: Not on file    Gets together: Not on file    Attends religious service: Not on file    Active member of club or organization: Not on file    Attends meetings of clubs or organizations: Not on file    Relationship status: Not on file  . Intimate partner violence:    Fear of current or ex partner: Not on file    Emotionally abused: Not on file    Physically abused: Not on file    Forced sexual activity: Not on file  Other Topics Concern  . Not on file  Social [name] Narrative  . Not on file     BP 140/66   Pulse 80   Ht 5\' 8"  (1.727 m)   Wt 189 lb 6.4 oz (85.9 kg)   SpO2 96%   BMI 28.80 kg/m   Physical Exam:  Well appearing 84 yo man, NAD HEENT: Unremarkable Neck:  6 cm JVD, no thyromegally Lymphatics:  No adenopathy Back:  No CVA tenderness Lungs:  Clear with no wheezes HEART:  Regular  rate  rhythm, no murmurs, no rubs, no clicks Abd:  soft, positive bowel sounds, no organomegally, no rebound, no guarding Ext:  2 plus pulses, no edema, no cyanosis, no clubbing Skin:  No rashes no nodules Neuro:  CN II through XII intact, motor grossly intact  EKG - atrial fib with rBBB  Assess/Plan: 1. Atrial fib - his ventricular rate is well controlled.  2. Chronic diastolic heart failure - his symptoms are class 2. No change in meds. 3. Dyslipidemia - he will start low dose crestor today. We will have him come back for labs in 6 weeks. 4. HTN - his blood pressure is reasonably well controlled. He does admit to some sodium indiscretion. I asked him to reduce his salt intake.  Aaron Bosworth.D.

## 2018-07-16 ENCOUNTER — Other Ambulatory Visit: Payer: Self-pay | Admitting: Internal Medicine

## 2018-08-07 ENCOUNTER — Ambulatory Visit: Payer: Medicare Other | Admitting: Podiatry

## 2018-08-07 ENCOUNTER — Encounter: Payer: Self-pay | Admitting: Podiatry

## 2018-08-07 DIAGNOSIS — I739 Peripheral vascular disease, unspecified: Secondary | ICD-10-CM

## 2018-08-07 DIAGNOSIS — M79675 Pain in left toe(s): Secondary | ICD-10-CM | POA: Diagnosis not present

## 2018-08-07 DIAGNOSIS — M79674 Pain in right toe(s): Secondary | ICD-10-CM

## 2018-08-07 DIAGNOSIS — B351 Tinea unguium: Secondary | ICD-10-CM

## 2018-08-07 DIAGNOSIS — L608 Other nail disorders: Secondary | ICD-10-CM

## 2018-08-09 ENCOUNTER — Other Ambulatory Visit: Payer: Self-pay | Admitting: Internal Medicine

## 2018-08-09 NOTE — Progress Notes (Addendum)
Subjective: [name] presents today with diabetes, PAD with claudication and diabetic neuropathy. He is seen for preventative care of painful, discolored, thick toenails which interfere with daily activities and routine tasks. He has chronic ingrown toenail right great toe. He denies any drainage, swelling or redness. Toes are described as tender when wearing enclosed shoe gear. Pain is getting progressively worse and relieved with periodic professional debridement.  He continues to apply Apinol to right great toe when it hurts.  Objective: Vascular Examination: Capillary refill time immediate x 10 digits Dorsalis pedis pulses and Posterior tibial pulses present b/l No digital hair x 10 digits Skin temperature gradient WNL b/l  Dermatological Examination: Skin with normal turgor, texture and tone  b/l Toenails 1-5 b/l discolored, thick, dystrophic with subungual debris and pain with palpation to nailbeds due to thickness of nails.  Right great toe nail plate with pincer nail deformity. + Pain on palpation. No drainage, no edema, no erythema noted. No increased digital warmth.  Musculoskeletal: Muscle strength 5/5 to all LE muscle groups  Neurological: Sensation intact with 10 gram monofilament b/l Vibratory sensation intact b/l  Assessment: 1. Painful onychomycosis toenails 1-5 b/l 2. Pincer nail deformity (ingrown toenail) right great toe 3. NIDDM with Peripheral arterial disease 4. Diabetic neuropathy  Plan: 1. Discuss diabetic foot care principles. Literature dispensed. 2 Toenails 1-5 b/l were debrided in length and girth without iatrogenic bleeding. Offending nail border debrided and curretaged right great toe. Border cleansed with alcohol. Triple antibiotic ointment applied. [name] is to soak in epsom salt and warm water once daily for 15 minutes and and apply Neosporin Ointment for one week. 3.   Patient to continue soft, supportive shoe gear 4.   Patient to  report any pedal injuries to medical professional  5.   Follow up 9 weeks. Patient/POA to call should there be a concern in the interim.

## 2018-08-17 ENCOUNTER — Other Ambulatory Visit: Payer: Medicare Other | Admitting: *Deleted

## 2018-08-17 DIAGNOSIS — E785 Hyperlipidemia, unspecified: Secondary | ICD-10-CM

## 2018-08-17 LAB — HEPATIC FUNCTION PANEL
ALBUMIN: 4.3 g/dL (ref 3.5–4.7)
ALT: 17 IU/L (ref 0–44)
AST: 25 IU/L (ref 0–40)
Alkaline Phosphatase: 55 IU/L (ref 39–117)
BILIRUBIN TOTAL: 0.5 mg/dL (ref 0.0–1.2)
Bilirubin, Direct: 0.2 mg/dL (ref 0.00–0.40)
Total Protein: 6.3 g/dL (ref 6.0–8.5)

## 2018-08-17 LAB — LIPID PANEL
CHOLESTEROL TOTAL: 81 mg/dL — AB (ref 100–199)
Chol/HDL Ratio: 2.9 ratio (ref 0.0–5.0)
HDL: 28 mg/dL — ABNORMAL LOW (ref >39)
LDL Calculated: 37 mg/dL (ref 0–99)
Triglycerides: 82 mg/dL (ref 0–149)
VLDL CHOLESTEROL CAL: 16 mg/dL (ref 5–40)

## 2018-09-16 ENCOUNTER — Encounter (INDEPENDENT_AMBULATORY_CARE_PROVIDER_SITE_OTHER): Payer: Self-pay | Admitting: Specialist

## 2018-09-16 ENCOUNTER — Ambulatory Visit (INDEPENDENT_AMBULATORY_CARE_PROVIDER_SITE_OTHER): Payer: Medicare Other | Admitting: Specialist

## 2018-09-16 ENCOUNTER — Ambulatory Visit (INDEPENDENT_AMBULATORY_CARE_PROVIDER_SITE_OTHER): Payer: Self-pay

## 2018-09-16 VITALS — BP 150/66 | HR 82 | Ht 68.0 in | Wt 189.4 lb

## 2018-09-16 DIAGNOSIS — M48062 Spinal stenosis, lumbar region with neurogenic claudication: Secondary | ICD-10-CM

## 2018-09-16 DIAGNOSIS — M545 Low back pain: Secondary | ICD-10-CM

## 2018-09-16 DIAGNOSIS — G8929 Other chronic pain: Secondary | ICD-10-CM | POA: Diagnosis not present

## 2018-09-16 DIAGNOSIS — M4316 Spondylolisthesis, lumbar region: Secondary | ICD-10-CM | POA: Diagnosis not present

## 2018-09-16 MED ORDER — TRAMADOL-ACETAMINOPHEN 37.5-325 MG PO TABS: 1.0000 | ORAL_TABLET | Freq: Four times a day (QID) | ORAL | 0 refills | Status: DC | PRN

## 2018-09-16 NOTE — Progress Notes (Signed)
Office Visit Note   Patient: [name]           Date of Birth: Sep 23, 1933           MRN: [id number] Visit Date: 09/16/2018              Requested by: [name], MD [address], [place] [address] PCP: [name], MD   Assessment & Plan: Visit Diagnoses:  1. Chronic low back pain, unspecified back pain laterality, unspecified whether sciatica present   2. Spinal stenosis, lumbar region with neurogenic claudication   3. Spondylolisthesis, lumbar region     Plan: Avoid bending, stooping and avoid lifting weights greater than 10 lbs. Avoid prolong standing and walking. Avoid frequent bending and stooping  No lifting greater than 10 lbs. May use ice or moist heat for pain. Weight loss is of benefit. Handicap license is approved. Use of a stationary bike or pool walking. Flexion  Exercises. Avoid prolong standing and walking. Walk only to tolerance. A cane helps to prevent falls and allows permission to stoop. Tylenol or glucosamine for arthritis. Tramadol like ultram or ultracet may be helpful for pain CBD oil capsule 41.6 mg  1-2 per day for pain. Follow-Up Instructions: Return in about 2 weeks (around 09/30/2018).   Orders:  Orders Placed This Encounter  Procedures  . XR Lumbar Spine Complete   No orders of the defined types were placed in this encounter.     Procedures: No procedures performed   Clinical Data: No additional findings.   Subjective: Chief Complaint  Patient presents with  . Lower Back - Pain  . Right Leg - Pain    84 year old male with history of afib, diabetes and previous bilateral LE raynaud's syndrome by history. He is having pain in the back and difficulty with  Walking a distance. All the energy go right out of my back. He has numbness and tingling in the legs and night pain with feelings of coolness right leg greater than the left. No bowel or bladder difficulty, prostate ca with 40 Radrx  treatments. Using gold bond creame.    Review of Systems  Constitutional: Negative.  Negative for activity change, appetite change, chills, diaphoresis, fatigue, fever and unexpected weight change.  HENT: Positive for rhinorrhea and sinus pain. Negative for congestion, dental problem, drooling, ear discharge, ear pain, facial swelling, hearing loss, mouth sores and postnasal drip.   Eyes: Positive for visual disturbance. Negative for photophobia, pain, discharge, redness and itching.  Respiratory: Positive for shortness of breath. Negative for apnea, cough, choking, chest tightness and stridor.   Cardiovascular: Positive for leg swelling. Negative for chest pain and palpitations.  Gastrointestinal: Negative.  Negative for abdominal distention, abdominal pain, anal bleeding, blood in stool, constipation, diarrhea and nausea.  Endocrine: Negative.  Negative for cold intolerance, heat intolerance, polydipsia, polyphagia and polyuria.  Genitourinary: Positive for flank pain. Negative for difficulty urinating, dysuria, enuresis, frequency, genital sores and hematuria.  Musculoskeletal: Positive for back pain and gait problem. Negative for arthralgias, joint swelling, myalgias, neck pain and neck stiffness.  Skin: Negative.  Negative for color change, pallor, rash and wound.  Allergic/Immunologic: Negative.  Negative for environmental allergies, food allergies and immunocompromised state.  Neurological: Positive for speech difficulty, weakness and numbness. Negative for dizziness, tremors, seizures, syncope, facial asymmetry, light-headedness and headaches.  Hematological: Negative for adenopathy. Bruises/bleeds easily.  Psychiatric/Behavioral: Negative.  Negative for agitation, behavioral problems, confusion, decreased concentration, dysphoric mood, hallucinations,  self-injury, sleep disturbance and suicidal ideas. The patient is not nervous/anxious and is not hyperactive.      Objective: Vital  Signs: BP (!) 150/66   Pulse 82   Ht 5\' 8"  (1.727 m)   Wt 189 lb 6.4 oz (85.9 kg)   BMI 28.80 kg/m   Physical Exam  Constitutional: He is oriented to person, place, and time. He appears well-developed and well-nourished.  HENT:  Head: Normocephalic and atraumatic.  Eyes: Pupils are equal, round, and reactive to light. EOM are normal.  Neck: Normal range of motion. Neck supple.  Pulmonary/Chest: Effort normal and breath sounds normal.  Abdominal: Soft. Bowel sounds are normal.  Neurological: He is alert and oriented to person, place, and time.  Skin: Skin is warm and dry.  Psychiatric: He has a normal mood and affect. His behavior is normal. Judgment and thought content normal.    Back Exam   Tenderness  The patient is experiencing tenderness in the lumbar.  Range of Motion  Extension: 50  Flexion: 40  Lateral bend right: abnormal  Lateral bend left: abnormal  Rotation right: abnormal  Rotation left: abnormal   Muscle Strength  Right Quadriceps:  5/5  Left Quadriceps:  5/5  Right Hamstrings:  5/5  Left Hamstrings:  5/5   Tests  Straight leg raise right: negative Straight leg raise left: negative  Reflexes  Patellar: 0/4 Achilles: 0/4 Babinski's sign: normal   Other  Toe walk: normal Heel walk: normal Sensation: normal Gait: normal  Erythema: no back redness Scars: absent  Comments:  Avoid bending, stooping and avoid lifting weights greater than 10 lbs. Avoid prolong standing and walking. Avoid frequent bending and stooping  No lifting greater than 10 lbs. May use ice or moist heat for pain. Weight loss is of benefit. Handicap license is approved.       Specialty Comments:  No specialty comments available.  Imaging: Xr Lumbar Spine Complete  Result Date: 09/16/2018 AP and Lateral flexion and extension radiographs with retrolisthesis L1-2 and L2-3, anterolisthesis L4-5 grade 1. CT scan abdomen 2017 reviewed demonstrates areas of lumbar spinal  stenosis most severe at L1-2, L2-3 and L4-5 with bilateral foramenal narrowing associated with each level. ASPVD of the aortoiliac disease.     PMFS History: Patient Active Problem List   Diagnosis Date Noted  . Claudication in peripheral vascular disease ([place]) 08/22/2017  . Absolute anemia 04/02/2016  . HLD (hyperlipidemia) 04/02/2016  . BP (high blood pressure) 04/02/2016  . Long term current use of anticoagulant 04/02/2016  . Macrocytosis 04/02/2016  . Arthritis, degenerative 04/02/2016  . History of colon polyps 04/02/2016  . Carcinoma of prostate ([place]) 04/02/2016  . Abdominal discomfort, epigastric 12/20/2015  . H/O adenomatous polyp of colon 12/20/2015  . Left upper quadrant pain 03/16/2015  . Chronic diastolic heart failure ([place]) 08/30/2014  . 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 S/P aortic valve replacement with bioprosthetic valve 01/19/2013  . Diabetes mellitus ([place]) 12/21/2012  . Hyperlipemia 12/21/2012  . H/O prostate cancer 12/21/2012  . Anticoagulated on Coumadin 12/21/2012  . HYPERTENSION, CONTROLLED 09/06/2009  . AORTIC STENOSIS 09/06/2009  . ATRIAL FIBRILLATION, CHRONIC 09/06/2009   Past Medical History:  Diagnosis Date  . Anemia    LOW PLATELETS OTHER DAY  PER PT  . Anticoagulated on Coumadin 12/21/2012  . Aortic stenosis   . Arthritis    "left wrist; back sometimes" (01/21/2013)  . Atrial fibrillation ([place])    [name], DR COOPER  .  Bradycardia   . Exertional shortness of breath   . Heart murmur    "I've had it for years; runs in the family on daddy's side" (01/21/2013)  . HTN (hypertension)   . Hyperlipemia 12/21/2012  . Prostate cancer [facility])    "had 40 tx of radiation in 2009" (01/21/2013)  . S/P aortic valve replacement with bioprosthetic valve 01/19/2013   Transcatheter Aortic Valve Replacement using 61mm Sapien bioprosthetic tissue valve via transapical approach  . Type II diabetes mellitus (HCC)     Family History    Problem Relation Age of Onset  . Diabetes Mother   . Melanoma Mother 40  . Bone cancer Mother 22  . Pancreatic cancer Father 106  . Heart disease Father   . Stroke Sister   . Cancer Brother 20       ? brain cancer    Past Surgical History:  Procedure Laterality Date  . CARDIAC CATHETERIZATION  12/16/2012  . CARDIAC VALVE REPLACEMENT  01/19/2013   AVR  . CATARACT EXTRACTION W/ INTRAOCULAR LENS  IMPLANT, BILATERAL    . EYE SURGERY    . INTRAOPERATIVE TRANSESOPHAGEAL ECHOCARDIOGRAM N/A 01/19/2013   Procedure: INTRAOPERATIVE TRANSESOPHAGEAL ECHOCARDIOGRAM;  Surgeon: [name], MD;  Location: [place];  Service: Open Heart Surgery;  Laterality: N/A;  . ORIF FOREARM FRACTURE Left 1954   "compound fx" (01/21/2013)  . TRANSTHORACIC ECHOCARDIOGRAM  09/04/10, 09/07/08   Social History   Occupational History  . Not on file  Tobacco Use  . Smoking status: Former Smoker    Packs/day: 2.00    Years: 22.00    Pack years: 44.00    Types: Cigarettes    Last attempt to quit: 09/11/1976    Years since quitting: 42.0  . Smokeless tobacco: Never Used  Substance and Sexual Activity  . Alcohol use: No  . Drug use: No  . Sexual activity: Never

## 2018-09-16 NOTE — Patient Instructions (Addendum)
Avoid bending, stooping and avoid lifting weights greater than 10 lbs. Avoid prolong standing and walking. Avoid frequent bending and stooping  No lifting greater than 10 lbs. May use ice or moist heat for pain. Weight loss is of benefit. Handicap license is approved. Use of a stationary bike or pool walking. Flexion  Exercises. Avoid prolong standing and walking. Walk only to tolerance. A cane helps to prevent falls and allows permission to stoop. Tylenol or glucosamine for arthritis. Tramadol like ultram or ultracet may be helpful for pain CBD oil capsule 41.6 mg  1-2 per day for pain

## 2018-10-08 ENCOUNTER — Encounter (INDEPENDENT_AMBULATORY_CARE_PROVIDER_SITE_OTHER): Payer: Self-pay | Admitting: Specialist

## 2018-10-08 ENCOUNTER — Ambulatory Visit (INDEPENDENT_AMBULATORY_CARE_PROVIDER_SITE_OTHER): Payer: Medicare Other | Admitting: Specialist

## 2018-10-08 VITALS — BP 145/56 | HR 48 | Ht 68.0 in | Wt 186.0 lb

## 2018-10-08 DIAGNOSIS — M47816 Spondylosis without myelopathy or radiculopathy, lumbar region: Secondary | ICD-10-CM

## 2018-10-08 DIAGNOSIS — M48062 Spinal stenosis, lumbar region with neurogenic claudication: Secondary | ICD-10-CM | POA: Diagnosis not present

## 2018-10-08 MED ORDER — TRAMADOL-ACETAMINOPHEN 37.5-325 MG PO TABS: 1.0000 | ORAL_TABLET | Freq: Four times a day (QID) | ORAL | 0 refills | Status: DC | PRN

## 2018-10-08 NOTE — Progress Notes (Signed)
Office Visit Note   Patient: [name]           Date of Birth: December 05, 1932           MRN: [id number] Visit Date: 10/08/2018              Requested by: [name], MD [facility] [address], [place] [address] PCP: [name], MD   Assessment & Plan: Visit Diagnoses:  1. Spinal stenosis of lumbar region with neurogenic claudication   2. Spondylosis without myelopathy or radiculopathy, lumbar region     Plan: Plan: Avoid bending, stooping and avoid lifting weights greater than 10 lbs. Avoid prolong standing and walking. Avoid frequent bending and stooping  No lifting greater than 10 lbs. May use ice or moist heat for pain. Weight loss is of benefit. Handicap license is approved. Use of a stationary bike or pool walking. Flexion  Exercises. Avoid prolong standing and walking. Walk only to tolerance. A cane helps to prevent falls and allows permission to stoop. Tylenol or glucosamine for arthritis. Tramadol like ultram or ultracet may be helpful for pain CBD oil capsule 41.6 mg  1-2 per day for pain. Follow-Up Instructions: Return in about 2 weeks (around 09/30/2018).   Follow-Up Instructions: Return in about 6 months (around 04/09/2019).   Orders:  No orders of the defined types were placed in this encounter.  Meds ordered this encounter  Medications  . traMADol-acetaminophen (ULTRACET) 37.5-325 MG tablet    Sig: Take 1 tablet by mouth every 6 (six) hours as needed.    Dispense:  30 tablet    Refill:  0      Procedures: No procedures performed   Clinical Data: No additional findings.   Subjective: Chief Complaint  Patient presents with  . Lower Back - Follow-up    84 year old male with arthritis and spinal stenosis symptoms. History of ASPVD and carotid disease. He should not take NSAIDs due to anticoagulation and yet he has symptoms of peripheral neuropathy.   Review of Systems  Constitutional: Negative.  Negative for activity  change, appetite change, chills, diaphoresis, fatigue, fever and unexpected weight change.  HENT: Negative.   Eyes: Negative.   Respiratory: Negative.   Cardiovascular: Negative.   Gastrointestinal: Negative.   Endocrine: Negative.   Genitourinary: Negative.   Musculoskeletal: Negative.   Skin: Negative.   Allergic/Immunologic: Negative.   Neurological: Negative.   Hematological: Negative.   Psychiatric/Behavioral: Negative.      Objective: Vital Signs: BP (!) 145/56 (BP Location: Left Arm, Patient Position: Sitting)   Pulse (!) 48   Ht 5\' 8"  (1.727 m)   Wt 186 lb (84.4 kg)   BMI 28.28 kg/m   Physical Exam  Constitutional: He is oriented to person, place, and time. He appears well-developed and well-nourished.  HENT:  Head: Normocephalic and atraumatic.  Eyes: Pupils are equal, round, and reactive to light. EOM are normal.  Neck: Normal range of motion. Neck supple.  Pulmonary/Chest: Effort normal and breath sounds normal.  Abdominal: Soft. Bowel sounds are normal.  Neurological: He is alert and oriented to person, place, and time.  Skin: Skin is warm and dry.  Psychiatric: He has a normal mood and affect. His behavior is normal. Judgment and thought content normal.    Back Exam   Tenderness  The patient is experiencing tenderness in the lumbar.  Range of Motion  Extension: abnormal  Flexion: normal  Lateral bend right: normal  Lateral bend left:  normal  Rotation right: normal  Rotation left: normal   Muscle Strength  Right Quadriceps:  5/5  Left Quadriceps:  5/5  Right Hamstrings:  5/5  Left Hamstrings:  5/5   Tests  Straight leg raise right: negative Straight leg raise left: negative  Reflexes  Patellar: 0/4 Achilles: 0/4 Biceps: 0/4  Other  Toe walk: normal Heel walk: normal Sensation: normal Gait: normal  Erythema: no back redness Scars: absent      Specialty Comments:  No specialty comments available.  Imaging: No results  found.   PMFS History: Patient Active Problem List   Diagnosis Date Noted  . Claudication in peripheral vascular disease ([facility]) 08/22/2017  . Absolute anemia 04/02/2016  . HLD (hyperlipidemia) 04/02/2016  . BP (high blood pressure) 04/02/2016  . Long term current use of anticoagulant 04/02/2016  . Macrocytosis 04/02/2016  . Arthritis, degenerative 04/02/2016  . History of colon polyps 04/02/2016  . Carcinoma of prostate ([facility]) 04/02/2016  . Abdominal discomfort, epigastric 12/20/2015  . H/O adenomatous polyp of colon 12/20/2015  . Left upper quadrant pain 03/16/2015  . Chronic diastolic heart failure ([facility]) 08/30/2014  . 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 S/P aortic valve replacement with bioprosthetic valve 01/19/2013  . Diabetes mellitus ([facility]) 12/21/2012  . Hyperlipemia 12/21/2012  . H/O prostate cancer 12/21/2012  . Anticoagulated on Coumadin 12/21/2012  . HYPERTENSION, CONTROLLED 09/06/2009  . AORTIC STENOSIS 09/06/2009  . ATRIAL FIBRILLATION, CHRONIC 09/06/2009   Past Medical History:  Diagnosis Date  . Anemia    LOW PLATELETS OTHER DAY  PER PT  . Anticoagulated on Coumadin 12/21/2012  . Aortic stenosis   . Arthritis    "left wrist; back sometimes" (01/21/2013)  . Atrial fibrillation ([facility])    [name], DR COOPER  . Bradycardia   . Exertional shortness of breath   . Heart murmur    "I've had it for years; runs in the family on daddy's side" (01/21/2013)  . HTN (hypertension)   . Hyperlipemia 12/21/2012  . Prostate cancer [facility])    "had 40 tx of radiation in 2009" (01/21/2013)  . S/P aortic valve replacement with bioprosthetic valve 01/19/2013   Transcatheter Aortic Valve Replacement using 29mm Sapien bioprosthetic tissue valve via transapical approach  . Type II diabetes mellitus ([facility])     Family History  Problem Relation Age of Onset  . Diabetes Mother   . Melanoma Mother 25  . Bone cancer Mother 18  . Pancreatic cancer Father 33  .  Heart disease Father   . Stroke Sister   . Cancer Brother 20       ? brain cancer    Past Surgical History:  Procedure Laterality Date  . CARDIAC CATHETERIZATION  12/16/2012  . CARDIAC VALVE REPLACEMENT  01/19/2013   AVR  . CATARACT EXTRACTION W/ INTRAOCULAR LENS  IMPLANT, BILATERAL    . EYE SURGERY    . INTRAOPERATIVE TRANSESOPHAGEAL ECHOCARDIOGRAM N/A 01/19/2013   Procedure: INTRAOPERATIVE TRANSESOPHAGEAL ECHOCARDIOGRAM;  Surgeon: [name], MD;  Location: [facility];  Service: Open Heart Surgery;  Laterality: N/A;  . ORIF FOREARM FRACTURE Left 1954   "compound fx" (01/21/2013)  . TRANSTHORACIC ECHOCARDIOGRAM  09/04/10, 09/07/08   Social History   Occupational History  . Not on file  Tobacco Use  . Smoking status: Former Smoker    Packs/day: 2.00    Years: 22.00    Pack years: 44.00    Types: Cigarettes    Last attempt to quit: 09/11/1976  Years since quitting: 42.1  . Smokeless tobacco: Never Used  Substance and Sexual Activity  . Alcohol use: No  . Drug use: No  . Sexual activity: Never

## 2018-10-08 NOTE — Patient Instructions (Signed)
Avoid bending, stooping and avoid lifting weights greater than 10 lbs. Avoid prolong standing and walking. Avoid frequent bending and stooping  No lifting greater than 10 lbs. May use ice or moist heat for pain. Weight loss is of benefit. Handicap license is approved.  

## 2018-11-05 ENCOUNTER — Other Ambulatory Visit (HOSPITAL_COMMUNITY): Payer: Self-pay | Admitting: Cardiovascular Disease

## 2018-11-05 DIAGNOSIS — I739 Peripheral vascular disease, unspecified: Secondary | ICD-10-CM

## 2018-11-06 ENCOUNTER — Ambulatory Visit: Payer: Medicare Other | Admitting: Podiatry

## 2018-11-06 ENCOUNTER — Encounter: Payer: Self-pay | Admitting: Podiatry

## 2018-11-06 DIAGNOSIS — E1142 Type 2 diabetes mellitus with diabetic polyneuropathy: Secondary | ICD-10-CM

## 2018-11-06 DIAGNOSIS — M79674 Pain in right toe(s): Secondary | ICD-10-CM | POA: Diagnosis not present

## 2018-11-06 DIAGNOSIS — B351 Tinea unguium: Secondary | ICD-10-CM | POA: Diagnosis not present

## 2018-11-06 DIAGNOSIS — L608 Other nail disorders: Secondary | ICD-10-CM

## 2018-11-06 DIAGNOSIS — M79675 Pain in left toe(s): Secondary | ICD-10-CM | POA: Diagnosis not present

## 2018-11-06 DIAGNOSIS — E1151 Type 2 diabetes mellitus with diabetic peripheral angiopathy without gangrene: Secondary | ICD-10-CM

## 2018-11-06 NOTE — Patient Instructions (Addendum)
Soak Instructions    THE DAY AFTER THE PROCEDURE  Place 1/4 cup of epsom salts in a quart of warm tap water.  Submerge your foot or feet with outer bandage intact for the initial soak; this will allow the bandage to become moist and wet for easy lift off.  Once you remove your bandage, continue to soak in the solution for 20 minutes.  This soak should be done twice a day.  Next, remove your foot or feet from solution, blot dry the affected area and cover.  You may use a band aid large enough to cover the area or use gauze and tape.  Apply other medications to the area as directed by the doctor such as polysporin neosporin.  IF YOUR SKIN BECOMES IRRITATED WHILE USING THESE INSTRUCTIONS, IT IS OKAY TO SWITCH TO  WHITE VINEGAR AND WATER. Or you may use antibacterial soap and water to keep the toe clean  Monitor for any signs/symptoms of infection. Call the office immediately if any occur or go directly to the emergency room. Call with any questions/concerns.   Diabetes Mellitus and Foot Care Foot care is an important part of your health, especially when you have diabetes. Diabetes may cause you to have problems because of poor blood flow (circulation) to your feet and legs, which can cause your skin to:  Become thinner and drier.  Break more easily.  Heal more slowly.  Peel and crack. You may also have nerve damage (neuropathy) in your legs and feet, causing decreased feeling in them. This means that you may not notice minor injuries to your feet that could lead to more serious problems. Noticing and addressing any potential problems early is the best way to prevent future foot problems. How to care for your feet Foot hygiene  Wash your feet daily with warm water and mild soap. Do not use hot water. Then, pat your feet and the areas between your toes until they are completely dry. Do not soak your feet as this can dry your skin.  Trim your toenails straight across. Do not dig under them or  around the cuticle. File the edges of your nails with an emery board or nail file.  Apply a moisturizing lotion or petroleum jelly to the skin on your feet and to dry, brittle toenails. Use lotion that does not contain alcohol and is unscented. Do not apply lotion between your toes. Shoes and socks  Wear clean socks or stockings every day. Make sure they are not too tight. Do not wear knee-high stockings since they may decrease blood flow to your legs.  Wear shoes that fit properly and have enough cushioning. Always look in your shoes before you put them on to be sure there are no objects inside.  To break in new shoes, wear them for just a few hours a day. This prevents injuries on your feet. Wounds, scrapes, corns, and calluses  Check your feet daily for blisters, cuts, bruises, sores, and redness. If you cannot see the bottom of your feet, use a mirror or ask someone for help.  Do not cut corns or calluses or try to remove them with medicine.  If you find a minor scrape, cut, or break in the skin on your feet, keep it and the skin around it clean and dry. You may clean these areas with mild soap and water. Do not clean the area with peroxide, alcohol, or iodine.  If you have a wound, scrape, corn, or callus on  your foot, look at it several times a day to make sure it is healing and not infected. Check for: ? Redness, swelling, or pain. ? Fluid or blood. ? Warmth. ? Pus or a bad smell. General instructions  Do not cross your legs. This may decrease blood flow to your feet.  Do not use heating pads or hot water bottles on your feet. They may burn your skin. If you have lost feeling in your feet or legs, you may not know this is happening until it is too late.  Protect your feet from hot and cold by wearing shoes, such as at the beach or on hot pavement.  Schedule a complete foot exam at least once a year (annually) or more often if you have foot problems. If you have foot problems,  report any cuts, sores, or bruises to your health care provider immediately. Contact a health care provider if:  You have a medical condition that increases your risk of infection and you have any cuts, sores, or bruises on your feet.  You have an injury that is not healing.  You have redness on your legs or feet.  You feel burning or tingling in your legs or feet.  You have pain or cramps in your legs and feet.  Your legs or feet are numb.  Your feet always feel cold.  You have pain around a toenail. Get help right away if:  You have a wound, scrape, corn, or callus on your foot and: ? You have pain, swelling, or redness that gets worse. ? You have fluid or blood coming from the wound, scrape, corn, or callus. ? Your wound, scrape, corn, or callus feels warm to the touch. ? You have pus or a bad smell coming from the wound, scrape, corn, or callus. ? You have a fever. ? You have a red line going up your leg. Summary  Check your feet every day for cuts, sores, red spots, swelling, and blisters.  Moisturize feet and legs daily.  Wear shoes that fit properly and have enough cushioning.  If you have foot problems, report any cuts, sores, or bruises to your health care provider immediately.  Schedule a complete foot exam at least once a year (annually) or more often if you have foot problems. This information is not intended to replace advice given to you by your health care provider. Make sure you discuss any questions you have with your health care provider. Document Released: 10/18/2000 Document Revised: 12/03/2017 Document Reviewed: 11/22/2016 Elsevier Interactive Patient Education  2019 Else[employer]. Onychomycosis/Fungal Toenails  WHAT IS IT? An infection that lies within the keratin of your nail plate that is caused by a fungus.  WHY ME? Fungal infections affect all ages, sexes, races, and creeds.  There may be many factors that predispose you to a fungal infection  such as age, coexisting medical conditions such as diabetes, or an autoimmune disease; stress, medications, fatigue, genetics, etc.  Bottom line: fungus thrives in a warm, moist environment and your shoes offer such a location.  IS IT CONTAGIOUS? Theoretically, yes.  You do not want to share shoes, nail clippers or files with someone who has fungal toenails.  Walking around barefoot in the same room or sleeping in the same bed is unlikely to transfer the organism.  It is important to realize, however, that fungus can spread easily from one nail to the next on the same foot.  HOW DO WE TREAT THIS?  There are several  ways to treat this condition.  Treatment may depend on many factors such as age, medications, pregnancy, liver and kidney conditions, etc.  It is best to ask your doctor which options are available to you.  1. No treatment.   Unlike many other medical concerns, you can live with this condition.  However for many people this can be a painful condition and may lead to ingrown toenails or a bacterial infection.  It is recommended that you keep the nails cut short to help reduce the amount of fungal nail. 2. Topical treatment.  These range from herbal remedies to prescription strength nail lacquers.  About 40-50% effective, topicals require twice daily application for approximately 9 to 12 months or until an entirely new nail has grown out.  The most effective topicals are medical grade medications available through physicians offices. 3. Oral antifungal medications.  With an 80-90% cure rate, the most common oral medication requires 3 to 4 months of therapy and stays in your system for a year as the new nail grows out.  Oral antifungal medications do require blood work to make sure it is a safe drug for you.  A liver function panel will be performed prior to starting the medication and after the first month of treatment.  It is important to have the blood work performed to avoid any harmful side  effects.  In general, this medication safe but blood work is required. 4. Laser Therapy.  This treatment is performed by applying a specialized laser to the affected nail plate.  This therapy is noninvasive, fast, and non-painful.  It is not covered by insurance and is therefore, out of pocket.  The results have been very good with a 80-95% cure rate.  [facility] is the only practice in the area to offer this therapy. 5. Permanent Nail Avulsion.  Removing the entire nail so that a new nail will not grow back.

## 2018-11-17 ENCOUNTER — Ambulatory Visit (HOSPITAL_BASED_OUTPATIENT_CLINIC_OR_DEPARTMENT_OTHER)
Admission: RE | Admit: 2018-11-17 | Discharge: 2018-11-17 | Disposition: A | Discharge status: Home / Self Care | Payer: Medicare Other | Source: Ambulatory Visit | Attending: Cardiovascular Disease | Admitting: Cardiovascular Disease

## 2018-11-17 ENCOUNTER — Encounter (HOSPITAL_COMMUNITY): Payer: Self-pay

## 2018-11-17 ENCOUNTER — Ambulatory Visit (HOSPITAL_COMMUNITY)
Admission: RE | Admit: 2018-11-17 | Discharge: 2018-11-17 | Disposition: A | Discharge status: Home / Self Care | Payer: Medicare Other | Source: Ambulatory Visit | Attending: Cardiovascular Disease | Admitting: Cardiovascular Disease

## 2018-11-17 DIAGNOSIS — I739 Peripheral vascular disease, unspecified: Secondary | ICD-10-CM

## 2018-11-17 DIAGNOSIS — R0989 Other specified symptoms and signs involving the circulatory and respiratory systems: Secondary | ICD-10-CM

## 2018-11-18 ENCOUNTER — Other Ambulatory Visit (INDEPENDENT_AMBULATORY_CARE_PROVIDER_SITE_OTHER): Payer: Self-pay | Admitting: Specialist

## 2018-11-18 NOTE — Telephone Encounter (Signed)
Ultracet refill request

## 2018-11-19 ENCOUNTER — Telehealth: Payer: Self-pay

## 2018-11-19 DIAGNOSIS — R0989 Other specified symptoms and signs involving the circulatory and respiratory systems: Secondary | ICD-10-CM

## 2018-11-19 NOTE — Telephone Encounter (Signed)
pt aware of results. Will send a copy of results of ABIs and Carotid doppler to address on file per pt request

## 2018-11-19 NOTE — Telephone Encounter (Signed)
pt aware of results  

## 2018-11-29 NOTE — Progress Notes (Signed)
Subjective: [name] is a 85 y.o. y.o. male  Who is seen for routine diabetic fooot care for chronic ingrown toenail right great toe (pincer nail deformity). He continues to apply Apinol to right great toe when it hurts.  He denies any redness, drainage or swelling of the digit, but states it has become uncomfortable again, especially when wearing enclosed shoe gear.  He would like to discuss having the ingrowing toenail or entire nailplate removed permanently.  He remains on coumadin.  Patient's PCP is [name], MD .  No Known Allergies    Objective: Vascular Examination: Capillary refill time immediate x 10 digits Dorsalis pedis pulses and Posterior tibial pulses present b/l No digital hair x 10 digits Skin temperature gradient WNL b/l  Dermatological Examination: Skin with normal turgor, texture and tone  B/l  Toenails 1-5 b/l discolored, thick, dystrophic with subungual debris and pain with palpation to nailbeds due to thickness of nails.  Right great toe nail plate with pincer nail deformity. + Pain on palpation. No drainage, no edema, no erythema noted. No increased digital warmth.  Musculoskeletal: Muscle strength 5/5 to all LE muscle groups  Neurological: Sensation intact with 10 gram monofilament b/l Vibratory sensation intact b/l  Assessment: 1. Painful onychomycosis toenails 1-5 b/l 2. Pincer nail deformity (ingrown toenail) right great toe 3. NIDDM with Peripheral arterial disease 4. Diabetic neuropathy  Plan: 1.  Left great toenail and toenails 2-5 b/l were debrided in length and girth without iatrogenic bleeding. Right great toenail was too painful for debridement and required local anesthetic. 3 cc of 50/50 mixture of 2% lidocaine plain and 0.5% marcaine plain were administered to right great toe. Offending nail borders debrided and curretaged. Borders cleansed with alcohol. Triple antibiotic ointment and bandaid were applied. Mr. Crumble was  instructed to soak his right foot in epsom salt and warm water once daily for the next week. He is to apply antibioitic ointment after soaks. He related understanding. He is to call the office should he experience any pain, redness, swelling, or drainage. 2.  We discussed having a permanent procedure done and I explained to him he would need to have clearance from vascular before we could proceed. I would like to see what his toe pressures are before we consider the procedure. He related understanding. 3.  Patient to continue soft, supportive shoe gear 4.  Patient to report any pedal injuries to medical professional immediately. 5.  Avoid self trimming due to use of blood thinner. 6.  Follow up 3 months. Patient/POA to call should there be a concern in the interim.

## 2019-02-05 ENCOUNTER — Ambulatory Visit: Payer: Medicare Other | Admitting: Podiatry

## 2019-02-05 ENCOUNTER — Other Ambulatory Visit: Payer: Self-pay

## 2019-02-05 ENCOUNTER — Encounter: Payer: Self-pay | Admitting: Podiatry

## 2019-02-05 DIAGNOSIS — M79675 Pain in left toe(s): Secondary | ICD-10-CM | POA: Diagnosis not present

## 2019-02-05 DIAGNOSIS — E1149 Type 2 diabetes mellitus with other diabetic neurological complication: Secondary | ICD-10-CM | POA: Diagnosis not present

## 2019-02-05 DIAGNOSIS — B351 Tinea unguium: Secondary | ICD-10-CM

## 2019-02-05 DIAGNOSIS — M79674 Pain in right toe(s): Secondary | ICD-10-CM | POA: Diagnosis not present

## 2019-02-08 ENCOUNTER — Encounter: Payer: Self-pay | Admitting: Podiatry

## 2019-02-08 NOTE — Progress Notes (Signed)
Subjective: [name] presents today with history of diabetic neuropathy and PAD for preventative diabetic foot care with chronic painful, mycotic toenails.  Pain is aggravated when wearing enclosed shoe gear and relieved with periodic professional debridement.  He voices no new problems on today's visit.  [name], MD is his PCP.    Current Outpatient Medications:  .  ACCU-CHEK AVIVA PLUS test strip, USE TO CHECK BLOOD SUGARS 1 TO 2 TIMES A DAY, Disp: , Rfl: 3 .  ACCU-CHEK SOFTCLIX LANCETS lancets, daily. use as directed, Disp: , Rfl: 3 .  amLODipine (NORVASC) 5 MG tablet, Take 5 mg by mouth daily. , Disp: , Rfl:  .  B Complex Vitamins (B COMPLEX 50) TABS, Take 1 tablet by mouth daily. , Disp: , Rfl:  .  carvedilol (COREG) 12.5 MG tablet, TAKE 1 TABLET BY MOUTH TWICE DAILY, Disp: 180 tablet, Rfl: 3 .  cholecalciferol (VITAMIN D) 1000 UNITS tablet, Take 1,000 Units by mouth 2 (two) times daily., Disp: , Rfl:  .  doxazosin (CARDURA) 8 MG tablet, Take 8 mg by mouth at bedtime. , Disp: , Rfl:  .  furosemide (LASIX) 40 MG tablet, TAKE 1 TABLET BY MOUTH ONCE DAILY, Disp: 90 tablet, Rfl: 3 .  glipiZIDE (GLUCOTROL) 5 MG tablet, Take 5 mg by mouth 2 (two) times daily before a meal. , Disp: , Rfl:  .  ketoconazole (NIZORAL) 2 % cream, APPLY TO BACK SIDE TWICE DAILY AS NEEDED, Disp: , Rfl: 3 .  lisinopril (PRINIVIL,ZESTRIL) 20 MG tablet, TAKE 1 TABLET BY MOUTH ONCE DAILY, Disp: 90 tablet, Rfl: 2 .  metFORMIN (GLUCOPHAGE) 1000 MG tablet, Take 1,000 mg by mouth 2 (two) times daily with a meal. , Disp: , Rfl:  .  Potassium Gluconate 595 MG CAPS, Take 595 mg by mouth 2 (two) times daily., Disp: , Rfl:  .  rosuvastatin (CRESTOR) 5 MG tablet, Take 1 tablet (5 mg total) by mouth daily., Disp: 90 tablet, Rfl: 3 .  Selenium 200 MCG CAPS, Take 200 mcg by mouth every morning. , Disp: , Rfl:  .  traMADol-acetaminophen (ULTRACET) 37.5-325 MG tablet, TAKE 1 TABLET BY MOUTH EVERY 6 HOURS AS NEEDED, Disp:  30 tablet, Rfl: 0 .  vitamin A 8000 UNIT capsule, Take 8,000 Units by mouth every morning., Disp: , Rfl:  .  vitamin B-12 (CYANOCOBALAMIN) 1000 MCG tablet, Take 1,000 mcg by mouth 2 (two) times daily., Disp: , Rfl:  .  vitamin C (ASCORBIC ACID) 500 MG tablet, Take 500 mg by mouth 2 (two) times daily. , Disp: , Rfl:  .  warfarin (COUMADIN) 7.5 MG tablet, Take 7.5 mg by mouth daily. Take as directed by the coumadin clinic, Disp: , Rfl:  .  zinc gluconate 50 MG tablet, Take 50 mg by mouth every morning. , Disp: , Rfl:  No current facility-administered medications for this visit.   Facility-Administered Medications Ordered in Other Visits:  .  norepinephrine (LEVOPHED) 8 mg in dextrose 5 % 250 mL infusion, 2-50 mcg/min, Intravenous, Titrated, [name], MD  No Known Allergies  Objective:  Vascular Examination: Capillary refill time immediate x 10 digits.  Dorsalis pedis and Posterior tibial pulses present b/l  Digital hair x 10 digits was absent.  Skin temperature gradient WNL b/l.  Dermatological Examination: Skin with normal turgor, texture and tone b/l.  Toenails 1-5 b/l discolored, thick, dystrophic with subungual debris and pain with palpation to nailbeds due to thickness of nails.  Incurvated nailplate b/l great toes  with tenderness to palpation. No erythema, no edema, no drainage noted.  Musculoskeletal: Muscle strength 5/5 to all muscle groups b/l.  Neurological: Sensation with 10 gram monofilament is present b/l.  Vibratory sensation diminished b/l.  Assessment: 1. Painful onychomycosis toenails 1-5 b/l 2. NIDDM with neuropathy  Plan: 1. Toenails 1-5 b/l were debrided in length and girth without iatrogenic bleeding.Offending nail borders debrided and curretaged b/l great toes. Borders cleansed with alcohol. Antibiotic ointment applied. No further treatment required by patient. 2. Patient to continue soft, supportive shoe gear 3. Patient to report any pedal  injuries to medical professional  4. Follow up 9 weeks. 5. Patient/POA to call should there be a concern in the interim.

## 2019-03-24 ENCOUNTER — Other Ambulatory Visit: Payer: Self-pay

## 2019-03-24 ENCOUNTER — Encounter (INDEPENDENT_AMBULATORY_CARE_PROVIDER_SITE_OTHER): Payer: Medicare Other | Admitting: Ophthalmology

## 2019-03-24 DIAGNOSIS — H43813 Vitreous degeneration, bilateral: Secondary | ICD-10-CM

## 2019-03-24 DIAGNOSIS — H35033 Hypertensive retinopathy, bilateral: Secondary | ICD-10-CM | POA: Diagnosis not present

## 2019-03-24 DIAGNOSIS — I10 Essential (primary) hypertension: Secondary | ICD-10-CM

## 2019-03-24 DIAGNOSIS — H35371 Puckering of macula, right eye: Secondary | ICD-10-CM

## 2019-03-24 DIAGNOSIS — H353121 Nonexudative age-related macular degeneration, left eye, early dry stage: Secondary | ICD-10-CM

## 2019-04-09 ENCOUNTER — Other Ambulatory Visit: Payer: Self-pay

## 2019-04-09 ENCOUNTER — Ambulatory Visit (INDEPENDENT_AMBULATORY_CARE_PROVIDER_SITE_OTHER): Payer: Medicare Other | Admitting: Specialist

## 2019-04-09 ENCOUNTER — Encounter: Payer: Self-pay | Admitting: Specialist

## 2019-04-09 VITALS — BP 149/61 | HR 71 | Ht 68.5 in | Wt 183.0 lb

## 2019-04-09 DIAGNOSIS — M4316 Spondylolisthesis, lumbar region: Secondary | ICD-10-CM | POA: Diagnosis not present

## 2019-04-09 DIAGNOSIS — M48062 Spinal stenosis, lumbar region with neurogenic claudication: Secondary | ICD-10-CM | POA: Diagnosis not present

## 2019-04-09 DIAGNOSIS — M4120 Other idiopathic scoliosis, site unspecified: Secondary | ICD-10-CM

## 2019-04-09 DIAGNOSIS — M4808 Spinal stenosis, sacral and sacrococcygeal region: Secondary | ICD-10-CM | POA: Diagnosis not present

## 2019-04-09 DIAGNOSIS — L89151 Pressure ulcer of sacral region, stage 1: Secondary | ICD-10-CM

## 2019-04-09 NOTE — Patient Instructions (Addendum)
Avoid bending, stooping and avoid lifting weights greater than 10 lbs. Avoid prolong standing and walking. Avoid frequent bending and stooping  No lifting greater than 10 lbs. May use ice or moist heat for pain. Weight loss is of benefit. Handicap license is approved. MRI of the lumbar spine is ordered, the MRI is conditional to the valve replacement. Place a cushion in chair, DME chair or wheel chair cushion given.

## 2019-04-09 NOTE — Progress Notes (Signed)
Office Visit Note   Patient: [name]           Date of Birth: 1933-06-11           MRN: [id number] Visit Date: 04/09/2019              Requested by: [name], MD [address] [place], [place] [address] PCP: [name], MD   Assessment & Plan: Visit Diagnoses:  1. Spinal stenosis of lumbar region with neurogenic claudication   2. Spinal stenosis, sacral and sacrococcygeal region   3. Spondylolisthesis, lumbar region   4. Other idiopathic scoliosis, unspecified spinal region   5. Decubitus ulcer of sacral region, stage 1     Plan:Avoid bending, stooping and avoid lifting weights greater than 10 lbs. Avoid prolong standing and walking. Avoid frequent bending and stooping  No lifting greater than 10 lbs. May use ice or moist heat for pain. Weight loss is of benefit. Handicap license is approved. MRI of the lumbar spine is ordered, the MRI is conditional to the valve replacement. Place a cushion in chair, DME chair or wheel chair cushion given.  Follow-Up Instructions: Return in about 4 weeks (around 05/07/2019).   Orders:  Orders Placed This Encounter  Procedures  . For home use only DME wheelchair cushion (seat and back)  . MR Lumbar Spine w/o contrast   No orders of the defined types were placed in this encounter.     Procedures: No procedures performed   Clinical Data: No additional findings.   Subjective: Chief Complaint  Patient presents with  . Lower Back - Follow-up    85 year old male with history of afibrillation and Doran Clay AVR THV done in 2014, MRI compatibility is conditional 3.0Tesla and 2500 gauss per cm. He is having difficulty walking to and from the mail box with severe leg pain and has to stop to relieve pain.    Review of Systems  Constitutional: Negative.   HENT: Negative.   Eyes: Negative.   Respiratory: Negative.   Cardiovascular: Negative.   Gastrointestinal: Negative.   Endocrine: Negative.    Genitourinary: Negative.   Musculoskeletal: Negative.   Skin: Negative.   Allergic/Immunologic: Negative.   Neurological: Negative.   Hematological: Negative.   Psychiatric/Behavioral: Negative.      Objective: Vital Signs: BP (!) 149/61 (BP Location: Left Arm, Patient Position: Sitting)   Pulse 71   Ht 5' 8.5" (1.74 m)   Wt 183 lb (83 kg)   BMI 27.42 kg/m   Physical Exam Constitutional:      General: He is not in acute distress.    Appearance: He is well-developed. He is not ill-appearing, toxic-appearing or diaphoretic.  HENT:     Head: Normocephalic and atraumatic.     Nose: Nose normal.     Mouth/Throat:     Mouth: Mucous membranes are moist.     Pharynx: Oropharynx is clear. No oropharyngeal exudate or posterior oropharyngeal erythema.  Eyes:     General: No scleral icterus.       Right eye: No discharge.        Left eye: No discharge.     Pupils: Pupils are equal, round, and reactive to light.  Neck:     Musculoskeletal: Normal range of motion and neck supple. No neck rigidity or muscular tenderness.     Vascular: No carotid bruit.  Pulmonary:     Effort: Pulmonary effort is normal.     Breath sounds: Normal  breath sounds.  Abdominal:     General: Bowel sounds are normal.     Palpations: Abdomen is soft.  Skin:    General: Skin is warm and dry.  Neurological:     Mental Status: He is alert and oriented to person, place, and time.  Psychiatric:        Behavior: Behavior normal.        Thought Content: Thought content normal.        Judgment: Judgment normal.     Back Exam   Tenderness  The patient is experiencing tenderness in the lumbar.  Range of Motion  Extension: abnormal  Flexion: normal  Lateral bend right: normal  Lateral bend left: normal  Rotation right: normal  Rotation left: normal   Muscle Strength  Right Quadriceps:  5/5  Left Quadriceps:  5/5  Right Hamstrings:  5/5  Left Hamstrings:  5/5   Tests  Straight leg raise right:  negative Straight leg raise left: negative  Reflexes  Patellar: 0/4 Achilles: 0/4 Babinski's sign: normal   Other  Toe walk: normal Heel walk: normal Sensation: normal Gait: antalgic       Specialty Comments:  No specialty comments available.  Imaging: No results found.   PMFS History: Patient Active Problem List   Diagnosis Date Noted  . Claudication in peripheral vascular disease ([place]) 08/22/2017  . Absolute anemia 04/02/2016  . HLD (hyperlipidemia) 04/02/2016  . BP (high blood pressure) 04/02/2016  . Long term current use of anticoagulant 04/02/2016  . Macrocytosis 04/02/2016  . Arthritis, degenerative 04/02/2016  . History of colon polyps 04/02/2016  . Carcinoma of prostate ([place]) 04/02/2016  . Abdominal discomfort, epigastric 12/20/2015  . H/O adenomatous polyp of colon 12/20/2015  . Left upper quadrant pain 03/16/2015  . Chronic diastolic heart failure ([place]) 08/30/2014  . 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 S/P aortic valve replacement with bioprosthetic valve 01/19/2013  . Diabetes mellitus ([place]) 12/21/2012  . Hyperlipemia 12/21/2012  . H/O prostate cancer 12/21/2012  . Anticoagulated on Coumadin 12/21/2012  . HYPERTENSION, CONTROLLED 09/06/2009  . AORTIC STENOSIS 09/06/2009  . ATRIAL FIBRILLATION, CHRONIC 09/06/2009   Past Medical History:  Diagnosis Date  . Anemia    LOW PLATELETS OTHER DAY  PER PT  . Anticoagulated on Coumadin 12/21/2012  . Aortic stenosis   . Arthritis    "left wrist; back sometimes" (01/21/2013)  . Atrial fibrillation ([place])    [name], DR COOPER  . Bradycardia   . Exertional shortness of breath   . Heart murmur    "I've had it for years; runs in the family on daddy's side" (01/21/2013)  . HTN (hypertension)   . Hyperlipemia 12/21/2012  . Prostate cancer [facility])    "had 40 tx of radiation in 2009" (01/21/2013)  . S/P aortic valve replacement with bioprosthetic valve 01/19/2013   Transcatheter  Aortic Valve Replacement using 35mm Sapien bioprosthetic tissue valve via transapical approach  . Type II diabetes mellitus (H[facility])     Family History  Problem Relation Age of Onset  . Diabetes Mother   . Melanoma Mother 89  . Bone cancer Mother 20  . Pancreatic cancer Father 38  . Heart disease Father   . Stroke Sister   . Cancer Brother 20       ? brain cancer    Past Surgical History:  Procedure Laterality Date  . CARDIAC CATHETERIZATION  12/16/2012  . CARDIAC VALVE REPLACEMENT  01/19/2013   AVR  . CATARACT  EXTRACTION W/ INTRAOCULAR LENS  IMPLANT, BILATERAL    . EYE SURGERY    . INTRAOPERATIVE TRANSESOPHAGEAL ECHOCARDIOGRAM N/A 01/19/2013   Procedure: INTRAOPERATIVE TRANSESOPHAGEAL ECHOCARDIOGRAM;  Surgeon: [name], MD;  Location: [facility];  Service: Open Heart Surgery;  Laterality: N/A;  . ORIF FOREARM FRACTURE Left 1954   "compound fx" (01/21/2013)  . TRANSTHORACIC ECHOCARDIOGRAM  09/04/10, 09/07/08   Social History   Occupational History  . Not on file  Tobacco Use  . Smoking status: Former Smoker    Packs/day: 2.00    Years: 22.00    Pack years: 44.00    Types: Cigarettes    Last attempt to quit: 09/11/1976    Years since quitting: 42.6  . Smokeless tobacco: Never Used  Substance and Sexual Activity  . Alcohol use: No  . Drug use: No  . Sexual activity: Never

## 2019-04-22 ENCOUNTER — Ambulatory Visit: Payer: Medicare Other | Admitting: Podiatry

## 2019-05-14 ENCOUNTER — Encounter: Payer: Self-pay | Admitting: Podiatry

## 2019-05-14 ENCOUNTER — Ambulatory Visit: Payer: Medicare Other | Admitting: Podiatry

## 2019-05-14 ENCOUNTER — Other Ambulatory Visit: Payer: Self-pay

## 2019-05-14 VITALS — Temp 97.7°F

## 2019-05-14 DIAGNOSIS — B351 Tinea unguium: Secondary | ICD-10-CM

## 2019-05-14 DIAGNOSIS — E1149 Type 2 diabetes mellitus with other diabetic neurological complication: Secondary | ICD-10-CM

## 2019-05-14 DIAGNOSIS — M79675 Pain in left toe(s): Secondary | ICD-10-CM

## 2019-05-14 DIAGNOSIS — M79674 Pain in right toe(s): Secondary | ICD-10-CM | POA: Diagnosis not present

## 2019-05-14 DIAGNOSIS — L84 Corns and callosities: Secondary | ICD-10-CM | POA: Diagnosis not present

## 2019-05-14 NOTE — Patient Instructions (Signed)

## 2019-05-14 NOTE — Progress Notes (Signed)
Subjective: [name] presents today with history of neuropathy. Patient seen for follow up of chronic, painful mycotic toenails and callus(es)/corn(s) which interfere with daily activities and routine tasks.  Pain is aggravated when wearing enclosed shoe gear. Pain is getting progressively worse and relieved with periodic professional debridement.   [name] states his right great toe is more symptomatic today. He denies any redness, drainage or swelling on today's visit.  He relates his wife will be coming to see me at the end of the month.  [name], MD is his PCP.    Current Outpatient Medications:  .  ACCU-CHEK AVIVA PLUS test strip, USE TO CHECK BLOOD SUGARS 1 TO 2 TIMES A DAY, Disp: , Rfl: 3 .  ACCU-CHEK SOFTCLIX LANCETS lancets, daily. use as directed, Disp: , Rfl: 3 .  amLODipine (NORVASC) 5 MG tablet, Take 5 mg by mouth daily. , Disp: , Rfl:  .  B Complex Vitamins (B COMPLEX 50) TABS, Take 1 tablet by mouth daily. , Disp: , Rfl:  .  carvedilol (COREG) 12.5 MG tablet, TAKE 1 TABLET BY MOUTH TWICE DAILY, Disp: 180 tablet, Rfl: 3 .  cholecalciferol (VITAMIN D) 1000 UNITS tablet, Take 1,000 Units by mouth 2 (two) times daily., Disp: , Rfl:  .  doxazosin (CARDURA) 8 MG tablet, Take 8 mg by mouth at bedtime. , Disp: , Rfl:  .  furosemide (LASIX) 40 MG tablet, TAKE 1 TABLET BY MOUTH ONCE DAILY, Disp: 90 tablet, Rfl: 3 .  glipiZIDE (GLUCOTROL) 5 MG tablet, Take 5 mg by mouth 2 (two) times daily before a meal. , Disp: , Rfl:  .  ketoconazole (NIZORAL) 2 % cream, APPLY TO BACK SIDE TWICE DAILY AS NEEDED, Disp: , Rfl: 3 .  lisinopril (PRINIVIL,ZESTRIL) 20 MG tablet, TAKE 1 TABLET BY MOUTH ONCE DAILY, Disp: 90 tablet, Rfl: 2 .  metFORMIN (GLUCOPHAGE) 1000 MG tablet, Take 1,000 mg by mouth 2 (two) times daily with a meal. , Disp: , Rfl:  .  Potassium Gluconate 595 MG CAPS, Take 595 mg by mouth 2 (two) times daily., Disp: , Rfl:  .  rosuvastatin (CRESTOR) 5 MG tablet, Take 1  tablet (5 mg total) by mouth daily., Disp: 90 tablet, Rfl: 3 .  Selenium 200 MCG CAPS, Take 200 mcg by mouth every morning. , Disp: , Rfl:  .  traMADol-acetaminophen (ULTRACET) 37.5-325 MG tablet, TAKE 1 TABLET BY MOUTH EVERY 6 HOURS AS NEEDED, Disp: 30 tablet, Rfl: 0 .  vitamin A 8000 UNIT capsule, Take 8,000 Units by mouth every morning., Disp: , Rfl:  .  vitamin B-12 (CYANOCOBALAMIN) 1000 MCG tablet, Take 1,000 mcg by mouth 2 (two) times daily., Disp: , Rfl:  .  vitamin C (ASCORBIC ACID) 500 MG tablet, Take 500 mg by mouth 2 (two) times daily. , Disp: , Rfl:  .  warfarin (COUMADIN) 7.5 MG tablet, Take 7.5 mg by mouth daily. Take as directed by the coumadin clinic, Disp: , Rfl:  .  zinc gluconate 50 MG tablet, Take 50 mg by mouth every morning. , Disp: , Rfl:  No current facility-administered medications for this visit.   Facility-Administered Medications Ordered in Other Visits:  .  norepinephrine (LEVOPHED) 8 mg in dextrose 5 % 250 mL infusion, 2-50 mcg/min, Intravenous, Titrated, [name], MD  No Known Allergies  Objective: Vitals:   05/14/19 0812  Temp: 97.7 F (36.5 C)    Vascular Examination: Capillary refill time immediate x 10 digits.  Dorsalis pedis pulses present  b/l.  Posterior tibial pulses present b/l.  No digital hair x 10 digits.  Skin temperature WNL b/l.  Dermatological Examination: Skin thin, shiny and atrophic b/l.  Toenails 1-5 b/l discolored, thick, dystrophic with subungual debris and pain with palpation to nailbeds due to thickness of nails.Incurvated nailplate b/l great toes with tenderness to palpation. No erythema, no edema, no drainage noted.  Hyperkeratotic lesion(s) submet head 5 right foot. No erythema, no edema, no drainage, no flocculence noted.   Musculoskeletal: Muscle strength 5/5 to all LE muscle groups.  Neurological: Sensation present with 10 gram monofilament.  Vibratory sensation diminished b/l.  Assessment: 1. Painful  onychomycosis toenails 1-5 b/l 2. Ingrown toenails b/l great toes, noninfected 3. Callus submet head 5 right foot 4. NIDDM with neuropathy   Plan: 1. Continue diabetic foot care principles.  2. Toenails 1-5 b/l were debrided in length and girth without iatrogenic bleeding. 3. Calluses pared submetatarsal head(s) 5 right foot utilizing sterile scalpel blade without incident. Corn(s) pared utilizing sterile scalpel blade without incident.  4. Patient to continue soft, supportive shoe gear daily. 5. Patient to report any pedal injuries to medical professional immediately. 6. Follow up 3 months.  7. Patient/POA to call should there be a concern in the interim.

## 2019-05-22 ENCOUNTER — Other Ambulatory Visit: Payer: Self-pay

## 2019-05-22 ENCOUNTER — Ambulatory Visit
Admission: RE | Admit: 2019-05-22 | Discharge: 2019-05-22 | Disposition: A | Discharge status: Home / Self Care | Payer: Medicare Other | Source: Ambulatory Visit | Attending: Specialist | Admitting: Specialist

## 2019-05-22 DIAGNOSIS — M4808 Spinal stenosis, sacral and sacrococcygeal region: Secondary | ICD-10-CM

## 2019-05-25 ENCOUNTER — Other Ambulatory Visit: Payer: Self-pay | Admitting: Internal Medicine

## 2019-06-03 ENCOUNTER — Other Ambulatory Visit: Payer: Self-pay

## 2019-06-03 ENCOUNTER — Ambulatory Visit (INDEPENDENT_AMBULATORY_CARE_PROVIDER_SITE_OTHER): Payer: Medicare Other | Admitting: Specialist

## 2019-06-03 ENCOUNTER — Encounter: Payer: Self-pay | Admitting: Specialist

## 2019-06-03 VITALS — BP 109/57 | HR 85 | Ht 68.5 in | Wt 183.0 lb

## 2019-06-03 DIAGNOSIS — M48062 Spinal stenosis, lumbar region with neurogenic claudication: Secondary | ICD-10-CM | POA: Diagnosis not present

## 2019-06-03 DIAGNOSIS — Z8546 Personal history of malignant neoplasm of prostate: Secondary | ICD-10-CM

## 2019-06-03 MED ORDER — GABAPENTIN 100 MG PO CAPS: ORAL_CAPSULE | ORAL | 0 refills | Status: DC

## 2019-06-03 NOTE — Addendum Note (Signed)
Addended by: Basil Dess on: 06/03/2019 11:57 AM   Modules accepted: Orders

## 2019-06-03 NOTE — Progress Notes (Signed)
Office Visit Note   Patient: [name]           Date of Birth: [birth year]           MRN: [id number] Visit Date: 06/03/2019              Requested by: [name], MD [address],  [place] [address] PCP: [name], MD   Assessment & Plan: Visit Diagnoses:  1. Spinal stenosis of lumbar region with neurogenic claudication   2. H/O prostate cancer     Plan: Avoid bending, stooping and avoid lifting weights greater than 10 lbs. Avoid prolong standing and walking. Avoid frequent bending and stooping  No lifting greater than 10 lbs. May use ice or moist heat for pain. Weight loss is of benefit. Handicap license is approved. Dr. [place]ona Curls secretary/Assistant will call to arrange for epidural steroid injection  Bone scan to assess for metastatic disease due to prostate cance  Follow-Up Instructions: Return in about 3 weeks (around 06/24/2019).   Orders:  Orders Placed This Encounter  Procedures  . NM Bone Scan Whole Body  . Ambulatory referral to Physical Medicine Rehab   Meds ordered this encounter  Medications  . gabapentin (NEURONTIN) 100 MG capsule    Sig: Take 1 capsule (100 mg total) by mouth at bedtime for 3 days, THEN 1 capsule (100 mg total) 2 (two) times daily for 3 days, THEN 1 capsule (100 mg total) 3 (three) times daily for 24 days.    Dispense:  81 capsule    Refill:  0      Procedures: No procedures performed   Clinical Data: No additional findings.   Subjective: Chief Complaint  Patient presents with  . Lower Back - Follow-up    85 year old male with history of back pain and radiation into the right leg he notes pain in the right anterior knee and  Had pain all night long right anterior knee. He has no bowel or bladder difficulty, feels like it is nerve pain with the right leg weakness and left leg feels weak too with walking. More on the right though. No pain with bending, increased pain with go to stand back  up after leaning over the tub.     Review of Systems  Constitutional: Positive for unexpected weight change. Negative for activity change, appetite change, chills, diaphoresis, fatigue and fever.  HENT: Negative.  Negative for congestion, dental problem, drooling, ear discharge, ear pain, facial swelling, hearing loss, mouth sores, nosebleeds, postnasal drip, rhinorrhea, sinus pressure, sinus pain, sneezing, sore throat and tinnitus.   Eyes: Negative for photophobia, pain, discharge, redness, itching and visual disturbance.  Respiratory: Negative.  Negative for apnea, cough, choking, chest tightness, shortness of breath, wheezing and stridor.   Cardiovascular: Negative for chest pain, palpitations and leg swelling.  Gastrointestinal: Positive for constipation and diarrhea. Negative for abdominal distention, abdominal pain, anal bleeding, blood in stool, nausea, rectal pain and vomiting.  Endocrine: Negative for cold intolerance, heat intolerance, polydipsia, polyphagia and polyuria.  Genitourinary: Positive for urgency. Negative for difficulty urinating, dysuria, enuresis, flank pain, frequency, genital sores and hematuria.  Musculoskeletal: Positive for back pain. Negative for arthralgias, joint swelling, myalgias, neck pain and neck stiffness.  Skin: Negative.  Negative for color change, pallor, rash and wound.  Allergic/Immunologic: Negative for environmental allergies, food allergies and immunocompromised state.  Neurological: Positive for weakness and numbness. Negative for dizziness, tremors, seizures, syncope, facial asymmetry, speech difficulty, light-headedness and  headaches.  Hematological: Negative for adenopathy. Does not bruise/bleed easily.  Psychiatric/Behavioral: Negative for agitation, behavioral problems, confusion, decreased concentration, dysphoric mood, hallucinations, self-injury, sleep disturbance and suicidal ideas. The patient is not nervous/anxious and is not hyperactive.       Objective: Vital Signs: BP (!) 109/57 (BP Location: Left Arm, Patient Position: Sitting)   Pulse 85   Ht 5' 8.5" (1.74 m)   Wt 183 lb (83 kg)   BMI 27.42 kg/m   Physical Exam Constitutional:      Appearance: He is well-developed.  HENT:     Head: Normocephalic and atraumatic.  Eyes:     Pupils: Pupils are equal, round, and reactive to light.  Neck:     Musculoskeletal: Normal range of motion and neck supple.  Pulmonary:     Effort: Pulmonary effort is normal.     Breath sounds: Normal breath sounds.  Abdominal:     General: Bowel sounds are normal.     Palpations: Abdomen is soft.  Musculoskeletal: Normal range of motion.  Skin:    General: Skin is warm and dry.  Neurological:     Mental Status: He is alert and oriented to person, place, and time.  Psychiatric:        Behavior: Behavior normal.        Thought Content: Thought content normal.        Judgment: Judgment normal.     Ortho Exam  Specialty Comments:  No specialty comments available.  Imaging: No results found.   PMFS History: Patient Active Problem List   Diagnosis Date Noted  . Claudication in peripheral vascular disease ([place]) 08/22/2017  . Absolute anemia 04/02/2016  . HLD (hyperlipidemia) 04/02/2016  . BP (high blood pressure) 04/02/2016  . Long term current use of anticoagulant 04/02/2016  . Macrocytosis 04/02/2016  . Arthritis, degenerative 04/02/2016  . History of colon polyps 04/02/2016  . Carcinoma of prostate ([place]) 04/02/2016  . Abdominal discomfort, epigastric 12/20/2015  . H/O adenomatous polyp of colon 12/20/2015  . Left upper quadrant pain 03/16/2015  . Chronic diastolic heart failure ([place]) 08/30/2014  . 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 S/P aortic valve replacement with bioprosthetic valve 01/19/2013  . Diabetes mellitus ([place]) 12/21/2012  . Hyperlipemia 12/21/2012  . H/O prostate cancer 12/21/2012  . Anticoagulated on Coumadin 12/21/2012  .  HYPERTENSION, CONTROLLED 09/06/2009  . AORTIC STENOSIS 09/06/2009  . ATRIAL FIBRILLATION, CHRONIC 09/06/2009   Past Medical History:  Diagnosis Date  . Anemia    LOW PLATELETS OTHER DAY  PER PT  . Anticoagulated on Coumadin 12/21/2012  . Aortic stenosis   . Arthritis    "left wrist; back sometimes" (01/21/2013)  . Atrial fibrillation ([place])    [name], DR COOPER  . Bradycardia   . Exertional shortness of breath   . Heart murmur    "I've had it for years; runs in the family on daddy's side" (01/21/2013)  . HTN (hypertension)   . Hyperlipemia 12/21/2012  . Prostate cancer [facility]    "had 40 tx of radiation in 2009" (01/21/2013)  . S/P aortic valve replacement with bioprosthetic valve 01/19/2013   Transcatheter Aortic Valve Replacement using 76mm Sapien bioprosthetic tissue valve via transapical approach  . Type II diabetes mellitus ([facility])     Family History  Problem Relation Age of Onset  . Diabetes Mother   . Melanoma Mother 59  . Bone cancer Mother 77  . Pancreatic cancer Father 73  . Heart disease Father   .  Stroke Sister   . Cancer Brother 20       ? brain cancer    Past Surgical History:  Procedure Laterality Date  . CARDIAC CATHETERIZATION  12/16/2012  . CARDIAC VALVE REPLACEMENT  01/19/2013   AVR  . CATARACT EXTRACTION W/ INTRAOCULAR LENS  IMPLANT, BILATERAL    . EYE SURGERY    . INTRAOPERATIVE TRANSESOPHAGEAL ECHOCARDIOGRAM N/A 01/19/2013   Procedure: INTRAOPERATIVE TRANSESOPHAGEAL ECHOCARDIOGRAM;  Surgeon: [name], MD;  Location: [facility];  Service: Open Heart Surgery;  Laterality: N/A;  . ORIF FOREARM FRACTURE Left 1954   "compound fx" (01/21/2013)  . TRANSTHORACIC ECHOCARDIOGRAM  09/04/10, 09/07/08   Social History   Occupational History  . Not on file  Tobacco Use  . Smoking status: Former Smoker    Packs/day: 2.00    Years: 22.00    Pack years: 44.00    Types: Cigarettes    Quit date: 09/11/1976    Years since quitting: 42.7  . Smokeless tobacco:  Never Used  Substance and Sexual Activity  . Alcohol use: No  . Drug use: No  . Sexual activity: Never

## 2019-06-03 NOTE — Patient Instructions (Signed)
Avoid bending, stooping and avoid lifting weights greater than 10 lbs. Avoid prolong standing and walking. Avoid frequent bending and stooping  No lifting greater than 10 lbs. May use ice or moist heat for pain. Weight loss is of benefit. Handicap license is approved. Dr. [place]ona Curls secretary/Assistant will call to arrange for epidural steroid injection  Bone scan to assess for metastatic disease due to prostate cancer.

## 2019-06-14 ENCOUNTER — Other Ambulatory Visit: Payer: Self-pay

## 2019-06-14 ENCOUNTER — Encounter (HOSPITAL_COMMUNITY)
Admission: RE | Admit: 2019-06-14 | Discharge: 2019-06-14 | Disposition: A | Discharge status: Home / Self Care | Payer: Medicare Other | Source: Ambulatory Visit | Attending: Specialist | Admitting: Specialist

## 2019-06-14 DIAGNOSIS — M48062 Spinal stenosis, lumbar region with neurogenic claudication: Secondary | ICD-10-CM | POA: Insufficient documentation

## 2019-06-14 DIAGNOSIS — Z8546 Personal history of malignant neoplasm of prostate: Secondary | ICD-10-CM | POA: Insufficient documentation

## 2019-06-14 MED ORDER — TECHNETIUM TC 99M MEDRONATE IV KIT
20.0000 | PACK | Freq: Once | INTRAVENOUS | Status: AC | PRN
  Administered 2019-06-14: 12:00:00 20 via INTRAVENOUS

## 2019-06-20 ENCOUNTER — Other Ambulatory Visit: Payer: Self-pay | Admitting: Internal Medicine

## 2019-06-23 ENCOUNTER — Ambulatory Visit (INDEPENDENT_AMBULATORY_CARE_PROVIDER_SITE_OTHER): Payer: Medicare Other | Admitting: Physical Medicine and Rehabilitation

## 2019-06-23 ENCOUNTER — Ambulatory Visit: Payer: Self-pay

## 2019-06-23 ENCOUNTER — Encounter: Payer: Self-pay | Admitting: Physical Medicine and Rehabilitation

## 2019-06-23 VITALS — BP 142/56 | HR 61

## 2019-06-23 DIAGNOSIS — M48062 Spinal stenosis, lumbar region with neurogenic claudication: Secondary | ICD-10-CM | POA: Diagnosis not present

## 2019-06-23 DIAGNOSIS — M5416 Radiculopathy, lumbar region: Secondary | ICD-10-CM | POA: Diagnosis not present

## 2019-06-23 MED ORDER — BETAMETHASONE SOD PHOS & ACET 6 (3-3) MG/ML IJ SUSP
12.0000 mg | Freq: Once | INTRAMUSCULAR | Status: AC
  Administered 2019-06-23: 12 mg

## 2019-06-23 NOTE — Progress Notes (Signed)
  Numeric Pain Rating Scale and Functional Assessment Average Pain 2   In the last MONTH (on 0-10 scale) has pain interfered with the following?  1. General activity like being  able to carry out your everyday physical activities such as walking, climbing stairs, carrying groceries, or moving a chair?  Rating(8)   +Driver, -BT, -Dye Allergies.

## 2019-06-30 ENCOUNTER — Encounter: Payer: Self-pay | Admitting: Specialist

## 2019-06-30 ENCOUNTER — Ambulatory Visit (INDEPENDENT_AMBULATORY_CARE_PROVIDER_SITE_OTHER): Payer: Medicare Other | Admitting: Specialist

## 2019-06-30 VITALS — BP 132/63 | HR 85 | Ht 68.5 in | Wt 183.0 lb

## 2019-06-30 DIAGNOSIS — M48062 Spinal stenosis, lumbar region with neurogenic claudication: Secondary | ICD-10-CM

## 2019-06-30 DIAGNOSIS — M1711 Unilateral primary osteoarthritis, right knee: Secondary | ICD-10-CM | POA: Diagnosis not present

## 2019-06-30 NOTE — Progress Notes (Signed)
Office Visit Note   Patient: [name]           Date of Birth: 12/06/1932           MRN: [id number] Visit Date: 06/30/2019              Requested by: [name], MD [address],  [place] [address] PCP: [name], MD   Assessment & Plan: Visit Diagnoses:  1. Spinal stenosis of lumbar region with neurogenic claudication   2. Unilateral primary osteoarthritis, right knee     Plan: Avoid bending, stooping and avoid lifting weights greater than 10 lbs. Avoid prolong standing and walking. Avoid frequent bending and stooping  No lifting greater than 10 lbs. May use ice or moist heat for pain. Weight loss is of benefit. Handicap license is approved. Call if pain recurrs and ask for my assistant Christy, we would then ask Dr. Romona Curls secretary/Assistant to call to arrange for further epidural steroid injection   Follow-Up Instructions: Return in about 3 months (around 09/30/2019).   Orders:  No orders of the defined types were placed in this encounter.  No orders of the defined types were placed in this encounter.     Procedures: No procedures performed   Clinical Data: No additional findings.   Subjective: Chief Complaint  Patient presents with  . Lower Back - Follow-up    Review Nuc Med Study,  He had left L4 TF injection on 06/23/2019 with Dr. Ernestina Patches and he states that it has helped and he is feeling better    85 year old male with back pain and radiation into the legs. Pain is better post injection by Dr. Ernestina Patches left L4 transforamenal stenosis. The gabapentin does not effect the back or leg pain. No bowel or bladder difficulties.   Review of Systems  Constitutional: Negative.   HENT: Negative.   Eyes: Negative.   Respiratory: Negative.   Cardiovascular: Negative.   Gastrointestinal: Negative.   Endocrine: Negative.   Genitourinary: Negative.   Musculoskeletal: Negative.   Skin: Negative.   Allergic/Immunologic:  Negative.   Neurological: Negative.   Hematological: Negative.   Psychiatric/Behavioral: Negative.      Objective: Vital Signs: BP 132/63 (BP Location: Left Arm, Patient Position: Sitting)   Pulse 85   Ht 5' 8.5" (1.74 m)   Wt 183 lb (83 kg)   BMI 27.42 kg/m   Physical Exam Constitutional:      Appearance: He is well-developed.  HENT:     Head: Normocephalic and atraumatic.  Eyes:     Pupils: Pupils are equal, round, and reactive to light.  Neck:     Musculoskeletal: Normal range of motion and neck supple.  Pulmonary:     Effort: Pulmonary effort is normal.     Breath sounds: Normal breath sounds.  Abdominal:     General: Bowel sounds are normal.     Palpations: Abdomen is soft.  Skin:    General: Skin is warm and dry.  Neurological:     Mental Status: He is alert and oriented to person, place, and time.  Psychiatric:        Behavior: Behavior normal.        Thought Content: Thought content normal.        Judgment: Judgment normal.     Back Exam   Tenderness  The patient is experiencing tenderness in the lumbar.  Range of Motion  Extension: abnormal  Flexion: abnormal  Lateral bend  right: normal  Lateral bend left: normal  Rotation right: normal  Rotation left: normal   Muscle Strength  The patient has normal back strength. Right Quadriceps:  5/5  Left Quadriceps:  5/5  Right Hamstrings:  5/5  Left Hamstrings:  5/5   Tests  Straight leg raise right: negative Straight leg raise left: negative  Reflexes  Patellar: 0/4 Achilles: 0/4 Biceps: normal Babinski's sign: normal   Other  Toe walk: normal Heel walk: normal Sensation: normal Gait: normal  Erythema: no back redness Scars: absent  Comments:  Motor is normal, right knee varus deformity, not painful      Specialty Comments:  No specialty comments available.  Imaging: No results found.   PMFS History: Patient Active Problem List   Diagnosis Date Noted  . Claudication in  peripheral vascular disease ([facility]) 08/22/2017  . Absolute anemia 04/02/2016  . HLD (hyperlipidemia) 04/02/2016  . BP (high blood pressure) 04/02/2016  . Long term current use of anticoagulant 04/02/2016  . Macrocytosis 04/02/2016  . Arthritis, degenerative 04/02/2016  . History of colon polyps 04/02/2016  . Carcinoma of prostate ([facility]) 04/02/2016  . Abdominal discomfort, epigastric 12/20/2015  . H/O adenomatous polyp of colon 12/20/2015  . Left upper quadrant pain 03/16/2015  . Chronic diastolic heart failure ([facility]) 08/30/2014  . 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 S/P aortic valve replacement with bioprosthetic valve 01/19/2013  . Diabetes mellitus ([facility]) 12/21/2012  . Hyperlipemia 12/21/2012  . H/O prostate cancer 12/21/2012  . Anticoagulated on Coumadin 12/21/2012  . HYPERTENSION, CONTROLLED 09/06/2009  . AORTIC STENOSIS 09/06/2009  . ATRIAL FIBRILLATION, CHRONIC 09/06/2009   Past Medical History:  Diagnosis Date  . Anemia    LOW PLATELETS OTHER DAY  PER PT  . Anticoagulated on Coumadin 12/21/2012  . Aortic stenosis   . Arthritis    "left wrist; back sometimes" (01/21/2013)  . Atrial fibrillation ([facility])    [name], DR COOPER  . Bradycardia   . Exertional shortness of breath   . Heart murmur    "I've had it for years; runs in the family on daddy's side" (01/21/2013)  . HTN (hypertension)   . Hyperlipemia 12/21/2012  . Prostate cancer [facility])    "had 40 tx of radiation in 2009" (01/21/2013)  . S/P aortic valve replacement with bioprosthetic valve 01/19/2013   Transcatheter Aortic Valve Replacement using 61mm Sapien bioprosthetic tissue valve via transapical approach  . Type II diabetes mellitus (H[facility])     Family History  Problem Relation Age of Onset  . Diabetes Mother   . Melanoma Mother 42  . Bone cancer Mother 57  . Pancreatic cancer Father 19  . Heart disease Father   . Stroke Sister   . Cancer Brother 20       ? brain cancer    Past  Surgical History:  Procedure Laterality Date  . CARDIAC CATHETERIZATION  12/16/2012  . CARDIAC VALVE REPLACEMENT  01/19/2013   AVR  . CATARACT EXTRACTION W/ INTRAOCULAR LENS  IMPLANT, BILATERAL    . EYE SURGERY    . INTRAOPERATIVE TRANSESOPHAGEAL ECHOCARDIOGRAM N/A 01/19/2013   Procedure: INTRAOPERATIVE TRANSESOPHAGEAL ECHOCARDIOGRAM;  Surgeon: [name], MD;  Location: [facility];  Service: Open Heart Surgery;  Laterality: N/A;  . ORIF FOREARM FRACTURE Left 1954   "compound fx" (01/21/2013)  . TRANSTHORACIC ECHOCARDIOGRAM  09/04/10, 09/07/08   Social History   Occupational History  . Not on file  Tobacco Use  . Smoking status: Former Smoker  Packs/day: 2.00    Years: 22.00    Pack years: 44.00    Types: Cigarettes    Quit date: 09/11/1976    Years since quitting: 42.8  . Smokeless tobacco: Never Used  Substance and Sexual Activity  . Alcohol use: No  . Drug use: No  . Sexual activity: Never

## 2019-06-30 NOTE — Patient Instructions (Signed)
Avoid bending, stooping and avoid lifting weights greater than 10 lbs. Avoid prolong standing and walking. Avoid frequent bending and stooping  No lifting greater than 10 lbs. May use ice or moist heat for pain. Weight loss is of benefit. Handicap license is approved. Call if pain recurrs and ask for my assistant Christy, we would then ask Dr. Romona Curls secretary/Assistant to call to arrange for further epidural steroid injection

## 2019-07-23 ENCOUNTER — Other Ambulatory Visit: Payer: Self-pay | Admitting: Internal Medicine

## 2019-08-02 ENCOUNTER — Encounter: Payer: Self-pay | Admitting: Internal Medicine

## 2019-08-02 ENCOUNTER — Other Ambulatory Visit: Payer: Self-pay

## 2019-08-02 ENCOUNTER — Ambulatory Visit (INDEPENDENT_AMBULATORY_CARE_PROVIDER_SITE_OTHER): Payer: Medicare Other | Admitting: Internal Medicine

## 2019-08-02 VITALS — BP 134/54 | HR 99 | Ht 68.5 in | Wt 186.0 lb

## 2019-08-02 DIAGNOSIS — I4891 Unspecified atrial fibrillation: Secondary | ICD-10-CM | POA: Diagnosis not present

## 2019-08-02 DIAGNOSIS — I5032 Chronic diastolic (congestive) heart failure: Secondary | ICD-10-CM

## 2019-08-02 NOTE — Progress Notes (Signed)
HPI [name] returns today for ongoing followup of aortic stenosis s/p TAVR, persistent atrial fib, and chronic diastolic heart failure. He has undergone treatment for prostate CA and has difficulty with nocturia.In the interim, he undergone spinal injection for his back but still has pain and weakness in his legs. No chest pain or sob. Minimal right leg edema.  No Known Allergies   Current Outpatient Medications  Medication Sig Dispense Refill  . ACCU-CHEK AVIVA PLUS test strip USE TO CHECK BLOOD SUGARS 1 TO 2 TIMES A DAY  3  . ACCU-CHEK SOFTCLIX LANCETS lancets daily. use as directed  3  . amLODipine (NORVASC) 5 MG tablet Take 5 mg by mouth daily.     . B Complex Vitamins (B COMPLEX 50) TABS Take 1 tablet by mouth daily.     . carvedilol (COREG) 12.5 MG tablet Take 1 tablet by mouth twice daily 180 tablet 0  . cholecalciferol (VITAMIN D) 1000 UNITS tablet Take 1,000 Units by mouth 2 (two) times daily.    Marland Kitchen doxazosin (CARDURA) 8 MG tablet Take 8 mg by mouth daily.     . furosemide (LASIX) 40 MG tablet Take 1 tablet by mouth once daily 90 tablet 0  . glipiZIDE (GLUCOTROL) 5 MG tablet Take 5 mg by mouth 2 (two) times daily before a meal.     . ketoconazole (NIZORAL) 2 % cream APPLY TO BACK SIDE TWICE DAILY AS NEEDED  3  . lisinopril (ZESTRIL) 20 MG tablet Take 1 tablet by mouth once daily 90 tablet 0  . metFORMIN (GLUCOPHAGE) 1000 MG tablet Take 1,000 mg by mouth 2 (two) times daily with a meal.     . Potassium Gluconate 595 MG CAPS Take 595 mg by mouth 2 (two) times daily.    . rosuvastatin (CRESTOR) 5 MG tablet Take 1 tablet (5 mg total) by mouth daily. Pt needs to keep upcoming appt in sept for further refills. Thanks 90 tablet 0  . Selenium 200 MCG CAPS Take 200 mcg by mouth every morning.     . vitamin A 8000 UNIT capsule Take 8,000 Units by mouth every morning.    . vitamin B-12 (CYANOCOBALAMIN) 1000 MCG tablet Take 1,000 mcg by mouth 2 (two) times daily.    . vitamin C  (ASCORBIC ACID) 500 MG tablet Take 500 mg by mouth 2 (two) times daily.     Marland Kitchen warfarin (COUMADIN) 5 MG tablet     . zinc gluconate 50 MG tablet Take 50 mg by mouth every morning.      Current Facility-Administered Medications  Medication Dose Route Frequency Provider Last Rate Last Dose  . betamethasone acetate-betamethasone sodium phosphate (CELESTONE) injection 12 mg  12 mg Other Once Magnus Sinning, MD       Facility-Administered Medications Ordered in Other Visits  Medication Dose Route Frequency Provider Last Rate Last Dose  . norepinephrine (LEVOPHED) 8 mg in dextrose 5 % 250 mL infusion  2-50 mcg/min Intravenous Titrated [name], MD         Past Medical History:  Diagnosis Date  . Anemia    LOW PLATELETS OTHER DAY  PER PT  . Anticoagulated on Coumadin 12/21/2012  . Aortic stenosis   . Arthritis    "left wrist; back sometimes" (01/21/2013)  . Atrial fibrillation ([place])    [name], DR COOPER  . Bradycardia   . Exertional shortness of breath   . Heart murmur    "I've had it for  years; runs in the family on daddy's side" (01/21/2013)  . HTN (hypertension)   . Hyperlipemia 12/21/2012  . Prostate cancer [facility])    "had 40 tx of radiation in 2009" (01/21/2013)  . S/P aortic valve replacement with bioprosthetic valve 01/19/2013   Transcatheter Aortic Valve Replacement using 3mm Sapien bioprosthetic tissue valve via transapical approach  . Type II diabetes mellitus (HCC)     ROS:   All systems reviewed and negative except as noted in the HPI.   Past Surgical History:  Procedure Laterality Date  . CARDIAC CATHETERIZATION  12/16/2012  . CARDIAC VALVE REPLACEMENT  01/19/2013   AVR  . CATARACT EXTRACTION W/ INTRAOCULAR LENS  IMPLANT, BILATERAL    . EYE SURGERY    . INTRAOPERATIVE TRANSESOPHAGEAL ECHOCARDIOGRAM N/A 01/19/2013   Procedure: INTRAOPERATIVE TRANSESOPHAGEAL ECHOCARDIOGRAM;  Surgeon: [name], MD;  Location: [facility];  Service: Open Heart Surgery;   Laterality: N/A;  . ORIF FOREARM FRACTURE Left 1954   "compound fx" (01/21/2013)  . TRANSTHORACIC ECHOCARDIOGRAM  09/04/10, 09/07/08     Family History  Problem Relation Age of Onset  . Diabetes Mother   . Melanoma Mother 5  . Bone cancer Mother 9  . Pancreatic cancer Father 58  . Heart disease Father   . Stroke Sister   . Cancer Brother 20       ? brain cancer     Social History   Socioeconomic History  . Marital status: Married    Spouse name: Not on file  . Number of children: Not on file  . Years of education: Not on file  . Highest education level: Not on file  Occupational History  . Not on file  Social Needs  . Financial resource strain: Not on file  . Food insecurity    Worry: Not on file    Inability: Not on file  . Transportation needs    Medical: Not on file    Non-medical: Not on file  Tobacco Use  . Smoking status: Former Smoker    Packs/day: 2.00    Years: 22.00    Pack years: 44.00    Types: Cigarettes    Quit date: 09/11/1976    Years since quitting: 42.9  . Smokeless tobacco: Never Used  Substance and Sexual Activity  . Alcohol use: No  . Drug use: No  . Sexual activity: Never  Lifestyle  . Physical activity    Days per week: Not on file    Minutes per session: Not on file  . Stress: Not on file  Relationships  . Social Herbalist on phone: Not on file    Gets together: Not on file    Attends religious service: Not on file    Active member of club or organization: Not on file    Attends meetings of clubs or organizations: Not on file    Relationship status: Not on file  . Intimate partner violence    Fear of current or ex partner: Not on file    Emotionally abused: Not on file    Physically abused: Not on file    Forced sexual activity: Not on file  Other Topics Concern  . Not on file  Social History Narrative  . Not on file     BP (!) 134/54   Pulse 99   Ht 5' 8.5" (1.74 m)   Wt 186 lb (84.4 kg)   SpO2 96%    BMI 27.87 kg/m   Physical  Exam:  Well appearing NAD HEENT: Unremarkable Neck:  No JVD, no thyromegally Lymphatics:  No adenopathy Back:  No CVA tenderness Lungs:  Clear with no wheezes HEART:  IRegular rate rhythm, no murmurs, no rubs, no clicks Abd:  soft, positive bowel sounds, no organomegally, no rebound, no guarding Ext:  2 plus pulses, no edema, no cyanosis, no clubbing Skin:  No rashes no nodules Neuro:  CN II through XII intact, motor grossly intact  EKG - atrial fib with a controlled VR  Assess/Plan: 1. Persistent atrial fib - his rate is well controlled. He is asymptomatic. 2. coags - he will continue warfarin 3. HTN - his SBP is up a bit. He is encouraged to maintain a low sodium diet.  Mikle Bosworth.D.

## 2019-08-02 NOTE — Patient Instructions (Signed)

## 2019-08-03 ENCOUNTER — Encounter: Payer: Self-pay | Admitting: Podiatry

## 2019-08-03 ENCOUNTER — Ambulatory Visit (INDEPENDENT_AMBULATORY_CARE_PROVIDER_SITE_OTHER): Payer: Medicare Other | Admitting: Podiatry

## 2019-08-03 DIAGNOSIS — M79675 Pain in left toe(s): Secondary | ICD-10-CM

## 2019-08-03 DIAGNOSIS — M79674 Pain in right toe(s): Secondary | ICD-10-CM

## 2019-08-03 DIAGNOSIS — B351 Tinea unguium: Secondary | ICD-10-CM | POA: Diagnosis not present

## 2019-08-03 DIAGNOSIS — E1142 Type 2 diabetes mellitus with diabetic polyneuropathy: Secondary | ICD-10-CM | POA: Diagnosis not present

## 2019-08-03 DIAGNOSIS — L84 Corns and callosities: Secondary | ICD-10-CM

## 2019-08-03 DIAGNOSIS — L600 Ingrowing nail: Secondary | ICD-10-CM | POA: Diagnosis not present

## 2019-08-03 NOTE — Patient Instructions (Addendum)
EPSOM SALT FOOT SOAK INSTRUCTIONS  1.  Place 1/4 cup of epsom salts in 2 quarts of warm tap water. IF YOU ARE DIABETIC, OR HAVE NEUROPATHY,  CHECK THE TEMPERATURE OF THE WATER WITH YOUR ELBOW.  2.  Submerge your foot/feet in the solution and soak for 20 minutes.      3.  Next, remove your foot or feet from solution, blot dry the affected area.    4.  Apply antibiotic ointment and cover with fabric band-aid .  5.  This soak should be done once a day for 10 days.   6.  Monitor for any signs/symptoms of infection such as redness, swelling, odor, drainage, increased pain, or non-healing of digit.   7.  Please do not hesitate to call the office and speak to a Nurse or Doctor if you have questions.   8.  If you experience fever, chills, nightsweats, nausea or vomiting with worsening of digit, please go to the emergency room.

## 2019-08-03 NOTE — Progress Notes (Signed)
[name] - 85 y.o. male MRN [id number]  Date of birth: 26-Mar-1933  Office Visit Note: Visit Date: 06/23/2019 PCP: [name], MD Referred by: [name], MD  Subjective: Chief Complaint  Patient presents with  . Lower Back - Pain   HPI:  [name] is a 85 y.o. male who comes in today At the request of Dr. Basil Dess for right 3 and L4 transforaminal epidural steroid injection.  Patient reports bilateral leg pain and back pain equally with numbness and tingling in both legs.  I did take the liberty today to complete injections bilaterally at for.  He will follow-up with Dr. Louanne Skye.  ROS Otherwise per HPI.  Assessment & Plan: Visit Diagnoses:  1. Lumbar radiculopathy   2. Spinal stenosis of lumbar region with neurogenic claudication     Plan: No additional findings.   Meds & Orders:  Meds ordered this encounter  Medications  . betamethasone acetate-betamethasone sodium phosphate (CELESTONE) injection 12 mg    Orders Placed This Encounter  Procedures  . XR C-ARM NO REPORT  . Epidural Steroid injection    Follow-up: Return if symptoms worsen or fail to improve.   Procedures: No procedures performed  Lumbosacral Transforaminal Epidural Steroid Injection - Sub-Pedicular Approach with Fluoroscopic Guidance  Patient: [name]      Date of Birth: Oct 01, 1933 MRN: [id number] PCP: [name], MD      Visit Date: 06/23/2019   Universal Protocol:    Date/Time: 06/23/2019  Consent Given By: the patient  Position: PRONE  Additional Comments: Vital signs were monitored before and after the procedure. Patient was prepped and draped in the usual sterile fashion. The correct patient, procedure, and site was verified.   Injection Procedure Details:  Procedure Site One Meds Administered:  Meds ordered this encounter  Medications  . betamethasone acetate-betamethasone sodium phosphate (CELESTONE) injection 12 mg    Laterality: Bilateral   Location/Site:  L4-L5  Needle size: 22 G  Needle type: Spinal  Needle Placement: Transforaminal  Findings:    -Comments: Excellent flow of contrast along the nerve and into the epidural space.  Procedure Details: After squaring off the end-plates to get a true AP view, the C-arm was positioned so that an oblique view of the foramen as noted above was visualized. The target area is just inferior to the "nose of the scotty dog" or sub pedicular. The soft tissues overlying this structure were infiltrated with 2-3 ml. of 1% Lidocaine without Epinephrine.  The spinal needle was inserted toward the target using a "trajectory" view along the fluoroscope beam.  Under AP and lateral visualization, the needle was advanced so it did not puncture dura and was located close the 6 O'Clock position of the pedical in AP tracterory. Biplanar projections were used to confirm position. Aspiration was confirmed to be negative for CSF and/or blood. A 1-2 ml. volume of Isovue-250 was injected and flow of contrast was noted at each level. Radiographs were obtained for documentation purposes.   After attaining the desired flow of contrast documented above, a 0.5 to 1.0 ml test dose of 0.25% Marcaine was injected into each respective transforaminal space.  The patient was observed for 90 seconds post injection.  After no sensory deficits were reported, and normal lower extremity motor function was noted,   the above injectate was administered so that equal amounts of the injectate were placed at each foramen (level) into the transforaminal epidural space.   Additional Comments:  The patient  tolerated the procedure well Dressing: 2 x 2 sterile gauze and Band-Aid    Post-procedure details: Patient was observed during the procedure. Post-procedure instructions were reviewed.  Patient left the clinic in stable condition.    Clinical History: No specialty comments available.     Objective:  VS:  HT:    WT:    BMI:     BP:(!) 142/56  HR:61bpm  TEMP: ( )  RESP:  Physical Exam  Ortho Exam Imaging: No results found.

## 2019-08-03 NOTE — Procedures (Signed)
Lumbosacral Transforaminal Epidural Steroid Injection - Sub-Pedicular Approach with Fluoroscopic Guidance  Patient: [name]      Date of Birth: [birth year] MRN: [id number] PCP: [name], MD      Visit Date: 06/23/2019   Universal Protocol:    Date/Time: 06/23/2019  Consent Given By: the patient  Position: PRONE  Additional Comments: Vital signs were monitored before and after the procedure. Patient was prepped and draped in the usual sterile fashion. The correct patient, procedure, and site was verified.   Injection Procedure Details:  Procedure Site One Meds Administered:  Meds ordered this encounter  Medications  . betamethasone acetate-betamethasone sodium phosphate (CELESTONE) injection 12 mg    Laterality: Bilateral  Location/Site:  L4-L5  Needle size: 22 G  Needle type: Spinal  Needle Placement: Transforaminal  Findings:    -Comments: Excellent flow of contrast along the nerve and into the epidural space.  Procedure Details: After squaring off the end-plates to get a true AP view, the C-arm was positioned so that an oblique view of the foramen as noted above was visualized. The target area is just inferior to the "nose of the scotty dog" or sub pedicular. The soft tissues overlying this structure were infiltrated with 2-3 ml. of 1% Lidocaine without Epinephrine.  The spinal needle was inserted toward the target using a "trajectory" view along the fluoroscope beam.  Under AP and lateral visualization, the needle was advanced so it did not puncture dura and was located close the 6 O'Clock position of the pedical in AP tracterory. Biplanar projections were used to confirm position. Aspiration was confirmed to be negative for CSF and/or blood. A 1-2 ml. volume of Isovue-250 was injected and flow of contrast was noted at each level. Radiographs were obtained for documentation purposes.   After attaining the desired flow of contrast documented above, a 0.5  to 1.0 ml test dose of 0.25% Marcaine was injected into each respective transforaminal space.  The patient was observed for 90 seconds post injection.  After no sensory deficits were reported, and normal lower extremity motor function was noted,   the above injectate was administered so that equal amounts of the injectate were placed at each foramen (level) into the transforaminal epidural space.   Additional Comments:  The patient tolerated the procedure well Dressing: 2 x 2 sterile gauze and Band-Aid    Post-procedure details: Patient was observed during the procedure. Post-procedure instructions were reviewed.  Patient left the clinic in stable condition.

## 2019-08-04 NOTE — Progress Notes (Signed)
Subjective: [name] is seen today for follow up painful, elongated, thickened toenails 1-5 b/l feet that he cannot cut. Pain interferes with daily activities. Aggravating factor includes wearing enclosed shoe gear and relieved with periodic debridement.  Patient relates left hallux is most painful on today.   Current Outpatient Medications on File Prior to Visit  Medication Sig  . ACCU-CHEK AVIVA PLUS test strip USE TO CHECK BLOOD SUGARS 1 TO 2 TIMES A DAY  . ACCU-CHEK SOFTCLIX LANCETS lancets daily. use as directed  . amLODipine (NORVASC) 5 MG tablet Take 5 mg by mouth daily.   . B Complex Vitamins (B COMPLEX 50) TABS Take 1 tablet by mouth daily.   . carvedilol (COREG) 12.5 MG tablet Take 1 tablet by mouth twice daily  . cholecalciferol (VITAMIN D) 1000 UNITS tablet Take 1,000 Units by mouth 2 (two) times daily.  Marland Kitchen doxazosin (CARDURA) 8 MG tablet Take 8 mg by mouth daily.   . furosemide (LASIX) 40 MG tablet Take 1 tablet by mouth once daily  . glipiZIDE (GLUCOTROL) 5 MG tablet Take 5 mg by mouth 2 (two) times daily before a meal.   . ketoconazole (NIZORAL) 2 % cream APPLY TO BACK SIDE TWICE DAILY AS NEEDED  . lisinopril (ZESTRIL) 20 MG tablet Take 1 tablet by mouth once daily  . metFORMIN (GLUCOPHAGE) 1000 MG tablet Take 1,000 mg by mouth 2 (two) times daily with a meal.   . Potassium Gluconate 595 MG CAPS Take 595 mg by mouth 2 (two) times daily.  . rosuvastatin (CRESTOR) 5 MG tablet Take 1 tablet (5 mg total) by mouth daily. Pt needs to keep upcoming appt in sept for further refills. Thanks  . Selenium 200 MCG CAPS Take 200 mcg by mouth every morning.   . vitamin A 8000 UNIT capsule Take 8,000 Units by mouth every morning.  . vitamin B-12 (CYANOCOBALAMIN) 1000 MCG tablet Take 1,000 mcg by mouth 2 (two) times daily.  . vitamin C (ASCORBIC ACID) 500 MG tablet Take 500 mg by mouth 2 (two) times daily.   Marland Kitchen warfarin (COUMADIN) 5 MG tablet   . zinc gluconate 50 MG tablet Take 50 mg  by mouth every morning.    Current Facility-Administered Medications on File Prior to Visit  Medication  . norepinephrine (LEVOPHED) 8 mg in dextrose 5 % 250 mL infusion     No Known Allergies   Objective:  Vascular Examination: Capillary refill time immediate x 10 digits.  Dorsalis pedis present b/l.  Posterior tibial pulses present b/l.  Digital hair  present x 10 digits.  Skin temperature gradient WNL b/l.   Dermatological Examination: Skin with normal turgor, texture and tone b/l  Toenails 1-5 b/l discolored, thick, dystrophic with subungual debris and pain with palpation to nailbeds due to thickness of nails.  Incurvated nailplate left great toe b/l borders with tenderness to palpation. No erythema, no edema, no drainage noted.  Hyperkeratotic lesion submet head 5 right foot with tenderness to palpation. No edema, no erythema, no drainage, no flocculence.  Musculoskeletal: Muscle strength 5/5 to all LE muscle groups.  No gross bony deformities b/l.  No pain, crepitus or joint limitation noted with ROM.   Neurological Examination: Protective sensation intact with 10 gram monofilament bilaterally.  Epicritic sensation present bilaterally.  Vibratory sensation intact bilaterally.   Assessment: Painful onychomycosis toenails 1-5 b/l  Painful ingrown toenail left hallux Callus submet head 5 right foot NIDDM with neuropathy  Plan: 1. Toenails 1-5 right, 2-5 left  were debrided in length and girth without iatrogenic bleeding. 2. Betadine prep performed to left hallux. Patient could not tolerate debridement without local injection. 3cc of 50/50 mixture of 1% lidocaine plain and 0.5% marcaine plain administered via hallux block. Offending nail border debrided and curretaged left hallux. Border cleansed with alcohol and triple antibiotic applied. Written instructions given for epsom salt soaks.  3. Patient to continue soft, supportive shoe gear. 4. Patient to report  any pedal injuries to medical professional immediately. 5. Follow up 1 week.  6. Patient/POA to call should there be a concern in the interim.

## 2019-08-10 ENCOUNTER — Encounter: Payer: Self-pay | Admitting: Podiatry

## 2019-08-10 ENCOUNTER — Other Ambulatory Visit: Payer: Self-pay

## 2019-08-10 ENCOUNTER — Ambulatory Visit (INDEPENDENT_AMBULATORY_CARE_PROVIDER_SITE_OTHER): Payer: Medicare Other | Admitting: Podiatry

## 2019-08-10 DIAGNOSIS — L600 Ingrowing nail: Secondary | ICD-10-CM | POA: Diagnosis not present

## 2019-08-10 DIAGNOSIS — M79674 Pain in right toe(s): Secondary | ICD-10-CM | POA: Diagnosis not present

## 2019-08-10 DIAGNOSIS — M79675 Pain in left toe(s): Secondary | ICD-10-CM | POA: Diagnosis not present

## 2019-08-10 MED ORDER — MUPIROCIN 2 % EX OINT: TOPICAL_OINTMENT | CUTANEOUS | 0 refills | Status: AC

## 2019-08-10 NOTE — Patient Instructions (Addendum)
EPSOM SALT FOOT SOAK INSTRUCTIONS  1.  Place 1/4 cup of epsom salts in 2 quarts of warm tap water. IF YOU ARE DIABETIC, OR HAVE NEUROPATHY,  CHECK THE TEMPERATURE OF THE WATER WITH YOUR ELBOW.  2.  Submerge your foot/feet in the solution and soak for 20 minutes.      3.  Next, remove your foot or feet from solution, blot dry the affected area.    4.  Apply antibiotic ointment and cover with fabric band-aid .  5.  This soak should be done once a day for 10 days.   6.  Monitor for any signs/symptoms of infection such as redness, swelling, odor, drainage, increased pain, or non-healing of digit.   7.  Please do not hesitate to call the office and speak to a Nurse or Doctor if you have questions.   8.  If you experience fever, chills, nightsweats, nausea or vomiting with worsening of digit, please go to the emergency room.    Ingrown Toenail An ingrown toenail occurs when the corner or sides of a toenail grow into the surrounding skin. This causes discomfort and pain. The big toe is most commonly affected, but any of the toes can be affected. If an ingrown toenail is not treated, it can become infected. What are the causes? This condition may be caused by:  Wearing shoes that are too small or tight.  An injury, such as stubbing your toe or having your toe stepped on.  Improper cutting or care of your toenails.  Having nail or foot abnormalities that were present from birth (congenital abnormalities), such as having a nail that is too big for your toe. What increases the risk? The following factors may make you more likely to develop ingrown toenails:  Age. Nails tend to get thicker with age, so ingrown nails are more common among older people.  Cutting your toenails incorrectly, such as cutting them very short or cutting them unevenly. An ingrown toenail is more likely to get infected if you have:  Diabetes.  Blood flow (circulation) problems. What are the signs or symptoms?  Symptoms of an ingrown toenail may include:  Pain, soreness, or tenderness.  Redness.  Swelling.  Hardening of the skin that surrounds the toenail. Signs that an ingrown toenail may be infected include:  Fluid or pus.  Symptoms that get worse instead of better. How is this diagnosed? An ingrown toenail may be diagnosed based on your medical history, your symptoms, and a physical exam. If you have fluid or blood coming from your toenail, a sample may be collected to test for the specific type of bacteria that is causing the infection. How is this treated? Treatment depends on how severe your ingrown toenail is. You may be able to care for your toenail at home.  If you have an infection, you may be prescribed antibiotic medicines.  If you have fluid or pus draining from your toenail, your health care provider may drain it.  If you have trouble walking, you may be given crutches to use.  If you have a severe or infected ingrown toenail, you may need a procedure to remove part or all of the nail. Follow these instructions at home: Foot care   Do not pick at your toenail or try to remove it yourself.  Soak your foot in warm, soapy water. Do this for 20 minutes, 3 times a day, or as often as told by your health care provider. This helps to keep your  toe clean and keep your skin soft.  Wear shoes that fit well and are not too tight. Your health care provider may recommend that you wear open-toed shoes while you heal.  Trim your toenails regularly and carefully. Cut your toenails straight across to prevent injury to the skin at the corners of the toenail. Do not cut your nails in a curved shape.  Keep your feet clean and dry to help prevent infection. Medicines  Take over-the-counter and prescription medicines only as told by your health care provider.  If you were prescribed an antibiotic, take it as told by your health care provider. Do not stop taking the antibiotic even if you  start to feel better. Activity  Return to your normal activities as told by your health care provider. Ask your health care provider what activities are safe for you.  Avoid activities that cause pain. General instructions  If your health care provider told you to use crutches to help you move around, use them as instructed.  Keep all follow-up visits as told by your health care provider. This is important. Contact a health care provider if:  You have more redness, swelling, pain, or other symptoms that do not improve with treatment.  You have fluid, blood, or pus coming from your toenail. Get help right away if:  You have a red streak on your skin that starts at your foot and spreads up your leg.  You have a fever. Summary  An ingrown toenail occurs when the corner or sides of a toenail grow into the surrounding skin. This causes discomfort and pain. The big toe is most commonly affected, but any of the toes can be affected.  If an ingrown toenail is not treated, it can become infected.  Fluid or pus draining from your toenail is a sign of infection. Your health care provider may need to drain it. You may be given antibiotics to treat the infection.  Trimming your toenails regularly and properly can help you prevent an ingrown toenail. This information is not intended to replace advice given to you by your health care provider. Make sure you discuss any questions you have with your health care provider. Document Released: 10/18/2000 Document Revised: 02/12/2019 Document Reviewed: 07/09/2017 Elsevier Patient Education  [place]? An infection that lies within the keratin of your nail plate that is caused by a fungus.  WHY ME? Fungal infections affect all ages, sexes, races, and creeds.  There may be many factors that predispose you to a fungal infection such as age, coexisting medical conditions such as diabetes, or an  autoimmune disease; stress, medications, fatigue, genetics, etc.  Bottom line: fungus thrives in a warm, moist environment and your shoes offer such a location.  IS IT CONTAGIOUS? Theoretically, yes.  You do not want to share shoes, nail clippers or files with someone who has fungal toenails.  Walking around barefoot in the same room or sleeping in the same bed is unlikely to transfer the organism.  It is important to realize, however, that fungus can spread easily from one nail to the next on the same foot.  HOW DO WE TREAT THIS?  There are several ways to treat this condition.  Treatment may depend on many factors such as age, medications, pregnancy, liver and kidney conditions, etc.  It is best to ask your doctor which options are available to you.  1. No treatment.   Unlike many other medical concerns,  you can live with this condition.  However for many people this can be a painful condition and may lead to ingrown toenails or a bacterial infection.  It is recommended that you keep the nails cut short to help reduce the amount of fungal nail. 2. Topical treatment.  These range from herbal remedies to prescription strength nail lacquers.  About 40-50% effective, topicals require twice daily application for approximately 9 to 12 months or until an entirely new nail has grown out.  The most effective topicals are medical grade medications available through physicians offices. 3. Oral antifungal medications.  With an 80-90% cure rate, the most common oral medication requires 3 to 4 months of therapy and stays in your system for a year as the new nail grows out.  Oral antifungal medications do require blood work to make sure it is a safe drug for you.  A liver function panel will be performed prior to starting the medication and after the first month of treatment.  It is important to have the blood work performed to avoid any harmful side effects.  In general, this medication safe but blood work is required.  4. Laser Therapy.  This treatment is performed by applying a specialized laser to the affected nail plate.  This therapy is noninvasive, fast, and non-painful.  It is not covered by insurance and is therefore, out of pocket.  The results have been very good with a 80-95% cure rate.  [facility] is the only practice in the area to offer this therapy. 5. Permanent Nail Avulsion.  Removing the entire nail so that a new nail will not grow back.

## 2019-08-15 NOTE — Progress Notes (Signed)
Subjective: [name] is seen today for follow up painful ingrown toenail b/l great toes. He has been soaking in warm epsom salt soaks daily. States right great toe is still tender and has been applying Apinol to toes. Still does not want to have temporary nail avulsion performed. Denies any drainage from area.  Current Outpatient Medications on File Prior to Visit  Medication Sig  . ACCU-CHEK AVIVA PLUS test strip USE TO CHECK BLOOD SUGARS 1 TO 2 TIMES A DAY  . ACCU-CHEK SOFTCLIX LANCETS lancets daily. use as directed  . amLODipine (NORVASC) 5 MG tablet Take 5 mg by mouth daily.   . B Complex Vitamins (B COMPLEX 50) TABS Take 1 tablet by mouth daily.   . carvedilol (COREG) 12.5 MG tablet Take 1 tablet by mouth twice daily  . cholecalciferol (VITAMIN D) 1000 UNITS tablet Take 1,000 Units by mouth 2 (two) times daily.  Marland Kitchen doxazosin (CARDURA) 8 MG tablet Take 8 mg by mouth daily.   . furosemide (LASIX) 40 MG tablet Take 1 tablet by mouth once daily  . glipiZIDE (GLUCOTROL) 5 MG tablet Take 5 mg by mouth 2 (two) times daily before a meal.   . ketoconazole (NIZORAL) 2 % cream APPLY TO BACK SIDE TWICE DAILY AS NEEDED  . lisinopril (ZESTRIL) 20 MG tablet Take 1 tablet by mouth once daily  . metFORMIN (GLUCOPHAGE) 1000 MG tablet Take 1,000 mg by mouth 2 (two) times daily with a meal.   . Potassium Gluconate 595 MG CAPS Take 595 mg by mouth 2 (two) times daily.  . rosuvastatin (CRESTOR) 5 MG tablet Take 1 tablet (5 mg total) by mouth daily. Pt needs to keep upcoming appt in sept for further refills. Thanks  . Selenium 200 MCG CAPS Take 200 mcg by mouth every morning.   . vitamin A 8000 UNIT capsule Take 8,000 Units by mouth every morning.  . vitamin B-12 (CYANOCOBALAMIN) 1000 MCG tablet Take 1,000 mcg by mouth 2 (two) times daily.  . vitamin C (ASCORBIC ACID) 500 MG tablet Take 500 mg by mouth 2 (two) times daily.   Marland Kitchen warfarin (COUMADIN) 5 MG tablet   . zinc gluconate 50 MG tablet Take 50 mg  by mouth every morning.    Current Facility-Administered Medications on File Prior to Visit  Medication  . norepinephrine (LEVOPHED) 8 mg in dextrose 5 % 250 mL infusion    No Known Allergies   Objective:  Neurovascular Status unchanged with palpable pulses and intact sensation b/l. Capillary refill time immediate x 10 digits.  Dermatological Examination: Skin with normal turgor, texture and tone b/l.  Recent debridement of toenails b/l.   Left hallux with evidence of recent debridement. No erythema, no edema, no drainage, no flocculence.  Right hallux with tenderness to palpation at medial/lateral nail borders. +Tenderness to palpation. No purulence, no warmth. No ischemia.  Musculoskeletal: Muscle strength 5/5 to all LE muscle groups b/l.  Neurological Examination: Protective sensation intact with 10 gram monofilament bilaterally.  Assessment: Ingrown toenail b/l great toes, noninfected Pain in toes  Plan: 1. Lidocaine Cream applied to b/l great toes. Offending nail borders debrided and curretaged. Borders cleansed with alcohol. Triple antibiotic ointment and band-aid applied to digits. Continue epsom salt/warm water soaks. Prescription written for Mupirocin Ointment. Patient is to apply to both great toes once daily after epsom salt/warm water soaks and cover with dressing.  2. Patient to continue soft, supportive shoe gear daily. 3. Patient to report any pedal injuries to medical  professional immediately. 4. Follow up 2 weeks. Discussed if condition does not improve, I recommend a temporary total nail avulsion to relieve symptoms as conservative measures have not resolved his symptoms. He related understanding.  5. Patient/POA to call should there be a concern in the interim.

## 2019-08-17 ENCOUNTER — Other Ambulatory Visit: Payer: Self-pay | Admitting: Internal Medicine

## 2019-08-24 ENCOUNTER — Other Ambulatory Visit: Payer: Self-pay

## 2019-08-24 ENCOUNTER — Ambulatory Visit: Payer: Medicare Other | Admitting: Podiatry

## 2019-08-24 ENCOUNTER — Encounter: Payer: Self-pay | Admitting: Podiatry

## 2019-08-24 DIAGNOSIS — E1142 Type 2 diabetes mellitus with diabetic polyneuropathy: Secondary | ICD-10-CM

## 2019-08-24 DIAGNOSIS — L600 Ingrowing nail: Secondary | ICD-10-CM

## 2019-08-24 NOTE — Patient Instructions (Signed)
Ingrown Toenail An ingrown toenail occurs when the corner or sides of a toenail grow into the surrounding skin. This causes discomfort and pain. The big toe is most commonly affected, but any of the toes can be affected. If an ingrown toenail is not treated, it can become infected. What are the causes? This condition may be caused by:  Wearing shoes that are too small or tight.  An injury, such as stubbing your toe or having your toe stepped on.  Improper cutting or care of your toenails.  Having nail or foot abnormalities that were present from birth (congenital abnormalities), such as having a nail that is too big for your toe. What increases the risk? The following factors may make you more likely to develop ingrown toenails:  Age. Nails tend to get thicker with age, so ingrown nails are more common among older people.  Cutting your toenails incorrectly, such as cutting them very short or cutting them unevenly. An ingrown toenail is more likely to get infected if you have:  Diabetes.  Blood flow (circulation) problems. What are the signs or symptoms? Symptoms of an ingrown toenail may include:  Pain, soreness, or tenderness.  Redness.  Swelling.  Hardening of the skin that surrounds the toenail. Signs that an ingrown toenail may be infected include:  Fluid or pus.  Symptoms that get worse instead of better. How is this diagnosed? An ingrown toenail may be diagnosed based on your medical history, your symptoms, and a physical exam. If you have fluid or blood coming from your toenail, a sample may be collected to test for the specific type of bacteria that is causing the infection. How is this treated? Treatment depends on how severe your ingrown toenail is. You may be able to care for your toenail at home.  If you have an infection, you may be prescribed antibiotic medicines.  If you have fluid or pus draining from your toenail, your health care provider may drain it.   If you have trouble walking, you may be given crutches to use.  If you have a severe or infected ingrown toenail, you may need a procedure to remove part or all of the nail. Follow these instructions at home: Foot care   Do not pick at your toenail or try to remove it yourself.  Soak your foot in warm, soapy water. Do this for 20 minutes, 3 times a day, or as often as told by your health care provider. This helps to keep your toe clean and keep your skin soft.  Wear shoes that fit well and are not too tight. Your health care provider may recommend that you wear open-toed shoes while you heal.  Trim your toenails regularly and carefully. Cut your toenails straight across to prevent injury to the skin at the corners of the toenail. Do not cut your nails in a curved shape.  Keep your feet clean and dry to help prevent infection. Medicines  Take over-the-counter and prescription medicines only as told by your health care provider.  If you were prescribed an antibiotic, take it as told by your health care provider. Do not stop taking the antibiotic even if you start to feel better. Activity  Return to your normal activities as told by your health care provider. Ask your health care provider what activities are safe for you.  Avoid activities that cause pain. General instructions  If your health care provider told you to use crutches to help you move around, use them   as instructed.  Keep all follow-up visits as told by your health care provider. This is important. Contact a health care provider if:  You have more redness, swelling, pain, or other symptoms that do not improve with treatment.  You have fluid, blood, or pus coming from your toenail. Get help right away if:  You have a red streak on your skin that starts at your foot and spreads up your leg.  You have a fever. Summary  An ingrown toenail occurs when the corner or sides of a toenail grow into the surrounding skin.  This causes discomfort and pain. The big toe is most commonly affected, but any of the toes can be affected.  If an ingrown toenail is not treated, it can become infected.  Fluid or pus draining from your toenail is a sign of infection. Your health care provider may need to drain it. You may be given antibiotics to treat the infection.  Trimming your toenails regularly and properly can help you prevent an ingrown toenail. This information is not intended to replace advice given to you by your health care provider. Make sure you discuss any questions you have with your health care provider. Document Released: 10/18/2000 Document Revised: 02/12/2019 Document Reviewed: 07/09/2017 Elsevier Patient Education  2020 [employer]  

## 2019-08-24 NOTE — Progress Notes (Signed)
Subjective: [name] is seen today for follow up painful ingrown toenails b/l great toes. Patient states he soaked for a few days and discontinued when his toes felt better. He is applying the Mupirocin Ointment to digits daily. He denies any redness or drainage of great toes. His toes feel better when wearing shoe gear now.  He denies any fever, chills, night sweats, nausea or vomiting.  No Known Allergies   Objective: 85 y.o. Caucasian male, WD, WN in NAD. AAO x 3.   Vascular Examination: Capillary refill time immediate x 10 digits.  Dorsalis pedis present b/l.  Posterior tibial pulses present b/l.  Digital hair absent b/l.  Skin temperature gradient WNL b/l.   Dermatological Examination: Skin thin, shiny and atrophic b/l.  Bilateral great toes recently trimmed. No erythema, no edema, no drainage, no flocculence. No pain on palpation. Toenails 2-5 b/l recently debrided.  Musculoskeletal: Muscle strength 5/5 to all LE muscle groups b/l.  No pain, crepitus or joint limitation noted with ROM.   Neurological Examination: Protective sensation intact 5/5 with 10 gram monofilament bilaterally.  Epicritic sensation present bilaterally.  Vibratory sensation diminished b/l.  Assessment: Ingrown toenails b/l great toes, resolved, asymptomatic NIDDM with neuropathy  Plan: 1. Patient to continue soft, supportive shoe gear. 2. Patient to report any pedal injuries to medical professional immediately. 3. Follow up 1 months. 4. Patient/POA to call should there be a concern in the interim.

## 2019-09-12 ENCOUNTER — Other Ambulatory Visit: Payer: Self-pay | Admitting: Internal Medicine

## 2019-09-21 ENCOUNTER — Other Ambulatory Visit: Payer: Self-pay

## 2019-09-21 ENCOUNTER — Ambulatory Visit: Payer: Medicare Other | Admitting: Podiatry

## 2019-09-21 ENCOUNTER — Encounter: Payer: Self-pay | Admitting: Podiatry

## 2019-09-21 DIAGNOSIS — L600 Ingrowing nail: Secondary | ICD-10-CM | POA: Diagnosis not present

## 2019-09-21 MED ORDER — NONFORMULARY OR COMPOUNDED ITEM: 3 refills | Status: DC

## 2019-09-21 NOTE — Patient Instructions (Signed)
[facility] [phone]   [email or web address] 

## 2019-09-25 ENCOUNTER — Other Ambulatory Visit: Payer: Self-pay | Admitting: Internal Medicine

## 2019-09-26 NOTE — Progress Notes (Signed)
Subjective: [name] is seen today for follow up painful, chronic ingrown toenails. Pain interferes with daily activities. Aggravating factor includes wearing enclosed shoe gear and relieved with periodic debridement.  Current Outpatient Medications on File Prior to Visit  Medication Sig  . ACCU-CHEK AVIVA PLUS test strip USE TO CHECK BLOOD SUGARS 1 TO 2 TIMES A DAY  . ACCU-CHEK SOFTCLIX LANCETS lancets daily. use as directed  . amLODipine (NORVASC) 5 MG tablet Take 5 mg by mouth daily.   . B Complex Vitamins (B COMPLEX 50) TABS Take 1 tablet by mouth daily.   . carvedilol (COREG) 12.5 MG tablet Take 1 tablet by mouth twice daily  . cholecalciferol (VITAMIN D) 1000 UNITS tablet Take 1,000 Units by mouth 2 (two) times daily.  Marland Kitchen doxazosin (CARDURA) 8 MG tablet Take 8 mg by mouth daily.   . furosemide (LASIX) 40 MG tablet Take 1 tablet by mouth once daily  . glipiZIDE (GLUCOTROL) 5 MG tablet Take 5 mg by mouth 2 (two) times daily before a meal.   . ketoconazole (NIZORAL) 2 % cream APPLY TO BACK SIDE TWICE DAILY AS NEEDED  . lisinopril (ZESTRIL) 20 MG tablet Take 1 tablet by mouth once daily  . metFORMIN (GLUCOPHAGE) 1000 MG tablet Take 1,000 mg by mouth 2 (two) times daily with a meal.   . Potassium Gluconate 595 MG CAPS Take 595 mg by mouth 2 (two) times daily.  . rosuvastatin (CRESTOR) 5 MG tablet Take 1 tablet (5 mg total) by mouth daily. Pt needs to keep upcoming appt in sept for further refills. Thanks  . Selenium 200 MCG CAPS Take 200 mcg by mouth every morning.   . vitamin A 8000 UNIT capsule Take 8,000 Units by mouth every morning.  . vitamin B-12 (CYANOCOBALAMIN) 1000 MCG tablet Take 1,000 mcg by mouth 2 (two) times daily.  . vitamin C (ASCORBIC ACID) 500 MG tablet Take 500 mg by mouth 2 (two) times daily.   Marland Kitchen warfarin (COUMADIN) 5 MG tablet   . zinc gluconate 50 MG tablet Take 50 mg by mouth every morning.    Current Facility-Administered Medications on File Prior to Visit   Medication  . norepinephrine (LEVOPHED) 8 mg in dextrose 5 % 250 mL infusion    No Known Allergies   Objective:  Vascular Examination: Capillary refill time immediate b/l.   Dorsalis pedis present b/l.  Posterior tibial pulses present b/l.  Digital hair absent b/l.   Skin temperature gradient WNL b/l.   Dermatological Examination: Skin thin, shiny and atrophic b/l.  Incurvated nailplate b/l great toes with tenderness to palpation. No erythema, no edema, no drainage noted.  Musculoskeletal: Muscle strength 5/5 to all LE muscle groups b/l.   No gross bony deformities b/l.  No pain, crepitus or joint limitation noted with ROM.   Neurological Examination: Protective sensation intact with 10 gram monofilament bilaterally.  Assessment: Chronic ingrown toenails b/l. NIDDM with neuropathy  Plan: 1. Offending nail borders debrided and curretaged b/l great toes. Borders cleansed with alcohol. Antibiotic ointment applied. He was advised to apply antibiotic ointment to digits once daily for one week. Discussed topical, laser and oral medication. Patient opted for topical treatment with compounded medication. Rx written for nonformulary compounding topical antifungal: [facility]: Antifungal cream - Terbinafine 3%, Fluconazole 2%, Tea Tree Oil 5%, Urea 10%, Ibuprofen 2% in DMSO Suspension #8ml. Apply to the affected nail(s) at bedtime. 2. Patient to continue soft, supportive shoe gear daily.  3. Patient to report any  pedal injuries to medical professional immediately. 4. Follow up 4 weeks. 5. Patient/POA to call should there be a concern in the interim.

## 2019-09-29 ENCOUNTER — Ambulatory Visit: Payer: Medicare Other | Admitting: Specialist

## 2019-09-29 ENCOUNTER — Other Ambulatory Visit: Payer: Self-pay

## 2019-09-29 ENCOUNTER — Encounter: Payer: Self-pay | Admitting: Specialist

## 2019-09-29 VITALS — BP 129/52 | HR 61 | Ht 68.5 in | Wt 186.0 lb

## 2019-09-29 DIAGNOSIS — M48062 Spinal stenosis, lumbar region with neurogenic claudication: Secondary | ICD-10-CM

## 2019-09-29 DIAGNOSIS — M1711 Unilateral primary osteoarthritis, right knee: Secondary | ICD-10-CM

## 2019-09-29 DIAGNOSIS — M19012 Primary osteoarthritis, left shoulder: Secondary | ICD-10-CM | POA: Diagnosis not present

## 2019-09-29 DIAGNOSIS — M19011 Primary osteoarthritis, right shoulder: Secondary | ICD-10-CM

## 2019-09-29 NOTE — Patient Instructions (Signed)
Avoid bending, stooping and avoid lifting weights greater than 10 lbs. Avoid prolong standing and walking. Avoid frequent bending and stooping  No lifting greater than 10 lbs. May use ice or moist heat for pain. Weight loss is of benefit. Handicap license is approved. Knee is suffering from osteoarthritis, only real proven treatments are Weight loss,and exercise. Well padded shoes help. Ice or hot shower the knee is suffering from osteoarthritis, only real proven treatments are Weight loss,Arthritis strength tylenol and exercise. Well padded shoes help. Ice the knee 2-3 times a day 15-20 mins at a time.-3 times a day 15-20 mins at a time. Hot showers in the AM.  Injection with steroid may be of benefit. Hemp CBD capsules, amazon.com 5,000-7,000 mg per bottle, 60 capsules per bottle, take one capsule twice a day. Cane in the left hand to use with left leg weight bearing. Follow-Up Instructions: No follow-ups on file.

## 2019-09-29 NOTE — Progress Notes (Signed)
Office Visit Note   Patient: [name]           Date of Birth: [birth year]           MRN: [id number] Visit Date: 09/29/2019              Requested by: [name], MD [address],  [place] [id number] PCP: [name], MD   Assessment & Plan: Visit Diagnoses:  1. Unilateral primary osteoarthritis, right knee   2. Spinal stenosis of lumbar region with neurogenic claudication   3. Primary osteoarthritis of both shoulders     Plan: Avoid bending, stooping and avoid lifting weights greater than 10 lbs. Avoid prolong standing and walking. Avoid frequent bending and stooping  No lifting greater than 10 lbs. May use ice or moist heat for pain. Weight loss is of benefit. Handicap license is approved. Knee is suffering from osteoarthritis, only real proven treatments are Weight loss,and exercise. Well padded shoes help. Ice or hot shower the knee is suffering from osteoarthritis, only real proven treatments are Weight loss,Arthritis strength tylenol and exercise. Well padded shoes help. Ice the knee 2-3 times a day 15-20 mins at a time.-3 times a day 15-20 mins at a time. Hot showers in the AM.  Injection with steroid may be of benefit. Hemp CBD capsules, amazon.com 5,000-7,000 mg per bottle, 60 capsules per bottle, take one capsule twice a day. Cane in the left hand to use with left leg weight bearing. Follow-Up Instructions: No follow-ups on file.  Follow-Up Instructions: Return in about 3 months (around 12/30/2019).   Orders:  No orders of the defined types were placed in this encounter.  No orders of the defined types were placed in this encounter.     Procedures: No procedures performed   Clinical Data: No additional findings.   Subjective: Chief Complaint  Patient presents with  . Lower Back - Follow-up    85 year old male with history of lumbar spondylosis and spinal stenosis and DJD right knee and shoulders bilaterally.  He has night pain and stays off the shoulders. No numbness or tingling in the arms but there is numbness and tingling in the legs and feet due to neuropathy. He has AM stiffness and I have advised against use of NSAIDs.    Review of Systems  Constitutional: Negative.   HENT: Negative.   Eyes: Negative.   Respiratory: Negative.   Cardiovascular: Negative.   Gastrointestinal: Negative.   Endocrine: Negative.   Genitourinary: Negative.   Musculoskeletal: Negative.   Allergic/Immunologic: Negative.   Neurological: Negative.   Psychiatric/Behavioral: Negative.      Objective: Vital Signs: BP (!) 129/52 (BP Location: Left Arm, Patient Position: Sitting)   Pulse 61   Ht 5' 8.5" (1.74 m)   Wt 186 lb (84.4 kg)   BMI 27.87 kg/m   Physical Exam Constitutional:      Appearance: He is well-developed.  HENT:     Head: Normocephalic and atraumatic.  Eyes:     Pupils: Pupils are equal, round, and reactive to light.  Neck:     Musculoskeletal: Normal range of motion and neck supple.  Pulmonary:     Effort: Pulmonary effort is normal.     Breath sounds: Normal breath sounds.  Abdominal:     General: Bowel sounds are normal.     Palpations: Abdomen is soft.  Skin:    General: Skin is warm and dry.  Neurological:  Mental Status: He is alert and oriented to person, place, and time.  Psychiatric:        Behavior: Behavior normal.        Thought Content: Thought content normal.        Judgment: Judgment normal.     Right Knee Exam   Muscle Strength  The patient has normal right knee strength.  Range of Motion  Extension:  -5 abnormal  Flexion: 130   Tests  McMurray:  Medial - negative Lateral - negative Varus: negative Valgus: negative Lachman:  Anterior - negative     Drawer:  Anterior - negative    Posterior - negative Pivot shift: negative Patellar apprehension: negative  Other  Erythema: absent Scars: absent Sensation: decreased Swelling: mild   Left Knee  Exam   Muscle Strength  The patient has normal left knee strength.  Range of Motion  Extension: 0  Flexion: 130   Tests  McMurray:  Medial - negative Lateral - negative Lachman:  Anterior - negative    Posterior - negative Drawer:  Anterior - negative     Posterior - negative Pivot shift: negative Patellar apprehension: negative  Other  Erythema: absent Scars: absent Sensation: decreased Swelling: mild   Back Exam   Tenderness  The patient is experiencing tenderness in the lumbar.  Range of Motion  Extension: abnormal  Flexion: normal  Lateral bend right: normal  Lateral bend left: normal  Rotation right: normal  Rotation left: normal   Muscle Strength  Right Quadriceps:  5/5  Left Quadriceps:  5/5  Right Hamstrings:  5/5  Left Hamstrings:  5/5   Tests  Straight leg raise right: negative Straight leg raise left: negative  Reflexes  Patellar: 1/4 Achilles: 1/4  Other  Toe walk: abnormal Heel walk: abnormal Erythema: no back redness Scars: absent      Specialty Comments:  No specialty comments available.  Imaging: No results found.   PMFS History: Patient Active Problem List   Diagnosis Date Noted  . Claudication in peripheral vascular disease ([facility]) 08/22/2017  . Absolute anemia 04/02/2016  . HLD (hyperlipidemia) 04/02/2016  . BP (high blood pressure) 04/02/2016  . Long term current use of anticoagulant 04/02/2016  . Macrocytosis 04/02/2016  . Arthritis, degenerative 04/02/2016  . History of colon polyps 04/02/2016  . Carcinoma of prostate ([facility]) 04/02/2016  . Abdominal discomfort, epigastric 12/20/2015  . H/O adenomatous polyp of colon 12/20/2015  . Left upper quadrant pain 03/16/2015  . Chronic diastolic heart failure ([facility]) 08/30/2014  . 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 S/P aortic valve replacement with bioprosthetic valve 01/19/2013  . Diabetes mellitus ([facility]) 12/21/2012  . Hyperlipemia 12/21/2012  .  H/O prostate cancer 12/21/2012  . Anticoagulated on Coumadin 12/21/2012  . HYPERTENSION, CONTROLLED 09/06/2009  . AORTIC STENOSIS 09/06/2009  . ATRIAL FIBRILLATION, CHRONIC 09/06/2009   Past Medical History:  Diagnosis Date  . Anemia    LOW PLATELETS OTHER DAY  PER PT  . Anticoagulated on Coumadin 12/21/2012  . Aortic stenosis   . Arthritis    "left wrist; back sometimes" (01/21/2013)  . Atrial fibrillation ([facility])    [name], DR COOPER  . Bradycardia   . Exertional shortness of breath   . Heart murmur    "I've had it for years; runs in the family on daddy's side" (01/21/2013)  . HTN (hypertension)   . Hyperlipemia 12/21/2012  . Prostate cancer [facility])    "had 40 tx of radiation in 2009" (01/21/2013)  .  S/P aortic valve replacement with bioprosthetic valve 01/19/2013   Transcatheter Aortic Valve Replacement using 8mm Sapien bioprosthetic tissue valve via transapical approach  . Type II diabetes mellitus ([facility])     Family History  Problem Relation Age of Onset  . Diabetes Mother   . Melanoma Mother 68  . Bone cancer Mother 65  . Pancreatic cancer Father 79  . Heart disease Father   . Stroke Sister   . Cancer Brother 20       ? brain cancer    Past Surgical History:  Procedure Laterality Date  . CARDIAC CATHETERIZATION  12/16/2012  . CARDIAC VALVE REPLACEMENT  01/19/2013   AVR  . CATARACT EXTRACTION W/ INTRAOCULAR LENS  IMPLANT, BILATERAL    . EYE SURGERY    . INTRAOPERATIVE TRANSESOPHAGEAL ECHOCARDIOGRAM N/A 01/19/2013   Procedure: INTRAOPERATIVE TRANSESOPHAGEAL ECHOCARDIOGRAM;  Surgeon: [name], MD;  Location: [facility];  Service: Open Heart Surgery;  Laterality: N/A;  . ORIF FOREARM FRACTURE Left 1954   "compound fx" (01/21/2013)  . TRANSTHORACIC ECHOCARDIOGRAM  09/04/10, 09/07/08   Social History   Occupational History  . Not on file  Tobacco Use  . Smoking status: Former Smoker    Packs/day: 2.00    Years: 22.00    Pack years: 44.00    Types: Cigarettes     Quit date: 09/11/1976    Years since quitting: 43.0  . Smokeless tobacco: Never Used  Substance and Sexual Activity  . Alcohol use: No  . Drug use: No  . Sexual activity: Never

## 2019-10-04 ENCOUNTER — Telehealth: Payer: Self-pay | Admitting: Podiatry

## 2019-10-04 NOTE — Telephone Encounter (Signed)
I called [facility] states the compound cream went out 09/27/2019.

## 2019-10-04 NOTE — Telephone Encounter (Signed)
Pt had prescription sent to [facility] and has mailed them a check but has not heard anything back from them and has not received his medication. Pt has called and left them a couple of messages but has not received a return call. Pt wanted to see if we could check and get an update for him.

## 2019-10-04 NOTE — Telephone Encounter (Signed)
I informed pt of [facility] statement that the rx went out 09/27/2019 and due to the holiday and holiday mail slow down.

## 2019-10-19 ENCOUNTER — Other Ambulatory Visit: Payer: Self-pay

## 2019-10-19 ENCOUNTER — Ambulatory Visit: Payer: Medicare Other | Admitting: Podiatry

## 2019-10-19 ENCOUNTER — Encounter: Payer: Self-pay | Admitting: Podiatry

## 2019-10-19 DIAGNOSIS — L600 Ingrowing nail: Secondary | ICD-10-CM

## 2019-10-19 NOTE — Patient Instructions (Signed)
Ingrown Toenail An ingrown toenail occurs when the corner or sides of a toenail grow into the surrounding skin. This causes discomfort and pain. The big toe is most commonly affected, but any of the toes can be affected. If an ingrown toenail is not treated, it can become infected. What are the causes? This condition may be caused by:  Wearing shoes that are too small or tight.  An injury, such as stubbing your toe or having your toe stepped on.  Improper cutting or care of your toenails.  Having nail or foot abnormalities that were present from birth (congenital abnormalities), such as having a nail that is too big for your toe. What increases the risk? The following factors may make you more likely to develop ingrown toenails:  Age. Nails tend to get thicker with age, so ingrown nails are more common among older people.  Cutting your toenails incorrectly, such as cutting them very short or cutting them unevenly. An ingrown toenail is more likely to get infected if you have:  Diabetes.  Blood flow (circulation) problems. What are the signs or symptoms? Symptoms of an ingrown toenail may include:  Pain, soreness, or tenderness.  Redness.  Swelling.  Hardening of the skin that surrounds the toenail. Signs that an ingrown toenail may be infected include:  Fluid or pus.  Symptoms that get worse instead of better. How is this diagnosed? An ingrown toenail may be diagnosed based on your medical history, your symptoms, and a physical exam. If you have fluid or blood coming from your toenail, a sample may be collected to test for the specific type of bacteria that is causing the infection. How is this treated? Treatment depends on how severe your ingrown toenail is. You may be able to care for your toenail at home.  If you have an infection, you may be prescribed antibiotic medicines.  If you have fluid or pus draining from your toenail, your health care provider may drain  it.  If you have trouble walking, you may be given crutches to use.  If you have a severe or infected ingrown toenail, you may need a procedure to remove part or all of the nail. Follow these instructions at home: Foot care   Do not pick at your toenail or try to remove it yourself.  Soak your foot in warm, soapy water. Do this for 20 minutes, 3 times a day, or as often as told by your health care provider. This helps to keep your toe clean and keep your skin soft.  Wear shoes that fit well and are not too tight. Your health care provider may recommend that you wear open-toed shoes while you heal.  Trim your toenails regularly and carefully. Cut your toenails straight across to prevent injury to the skin at the corners of the toenail. Do not cut your nails in a curved shape.  Keep your feet clean and dry to help prevent infection. Medicines  Take over-the-counter and prescription medicines only as told by your health care provider.  If you were prescribed an antibiotic, take it as told by your health care provider. Do not stop taking the antibiotic even if you start to feel better. Activity  Return to your normal activities as told by your health care provider. Ask your health care provider what activities are safe for you.  Avoid activities that cause pain. General instructions  If your health care provider told you to use crutches to help you move around, use them   as instructed.  Keep all follow-up visits as told by your health care provider. This is important. Contact a health care provider if:  You have more redness, swelling, pain, or other symptoms that do not improve with treatment.  You have fluid, blood, or pus coming from your toenail. Get help right away if:  You have a red streak on your skin that starts at your foot and spreads up your leg.  You have a fever. Summary  An ingrown toenail occurs when the corner or sides of a toenail grow into the surrounding  skin. This causes discomfort and pain. The big toe is most commonly affected, but any of the toes can be affected.  If an ingrown toenail is not treated, it can become infected.  Fluid or pus draining from your toenail is a sign of infection. Your health care provider may need to drain it. You may be given antibiotics to treat the infection.  Trimming your toenails regularly and properly can help you prevent an ingrown toenail. This information is not intended to replace advice given to you by your health care provider. Make sure you discuss any questions you have with your health care provider. Document Released: 10/18/2000 Document Revised: 02/12/2019 Document Reviewed: 07/09/2017 Elsevier Patient Education  2020 [employer].

## 2019-10-22 ENCOUNTER — Other Ambulatory Visit: Payer: Self-pay | Admitting: Internal Medicine

## 2019-10-31 NOTE — Progress Notes (Signed)
Subjective: [name] is seen today for follow up to evaluate ingrown nails s/p initiation of compounded topical antifungal from [facility]. He relates improvement in left great toe, but has some pain in right great toe.  He states he has been applying medication as instructed.   Medications reviewed in chart.  No Known Allergies   Objective:  Vascular Examination: Capillary refill time immediate b/l.  Dorsalis pedis and posterior tibial pulses present b/l.  Digital hair absent b/l.  Skin temperature gradient WNL b/l.   Dermatological Examination: Skin is thin, shiny and atrophic b/l.  Toenails b/l great toes incurvated at both borders. No nail border hypertrophy. No erythema, no edema, no drainage. Mild pain on palpation of right great toe lateral border.  Musculoskeletal: Muscle strength 5/5 to all LE muscle groups b/l.  No gross bony deformities b/l.  No pain, crepitus or joint limitation noted with ROM.   Neurological Examination: Protective sensation intact with 10 gram monofilament bilaterally.  Assessment: Ingrown nails b/l great toes, improving  Plan: 1. Right great toenail curretaged. Cleansed with alcohol. Triple antibiotic ointment applied. He is to continue compounded topical antifungal as instructed.  2. Patient to continue soft, supportive shoe gear daily. 3. Patient to report any pedal injuries to medical professional immediately. 4. Follow up 9 weeks. 5. Patient/POA to call should there be a concern in the interim.

## 2019-11-11 ENCOUNTER — Other Ambulatory Visit (HOSPITAL_COMMUNITY): Payer: Self-pay | Admitting: Cardiovascular Disease

## 2019-11-11 DIAGNOSIS — I739 Peripheral vascular disease, unspecified: Secondary | ICD-10-CM

## 2019-11-19 ENCOUNTER — Other Ambulatory Visit: Payer: Self-pay

## 2019-11-19 ENCOUNTER — Ambulatory Visit (HOSPITAL_COMMUNITY)
Admission: RE | Admit: 2019-11-19 | Discharge: 2019-11-19 | Disposition: A | Discharge status: Home / Self Care | Payer: Medicare Other | Source: Ambulatory Visit | Attending: Cardiovascular Disease | Admitting: Cardiovascular Disease

## 2019-11-19 ENCOUNTER — Ambulatory Visit (HOSPITAL_COMMUNITY): Admission: RE | Admit: 2019-11-19 | Payer: Medicare Other | Source: Ambulatory Visit

## 2019-11-19 ENCOUNTER — Encounter (HOSPITAL_COMMUNITY): Payer: Self-pay

## 2019-11-19 ENCOUNTER — Ambulatory Visit (HOSPITAL_BASED_OUTPATIENT_CLINIC_OR_DEPARTMENT_OTHER)
Admission: RE | Admit: 2019-11-19 | Discharge: 2019-11-19 | Disposition: A | Discharge status: Home / Self Care | Payer: Medicare Other | Source: Ambulatory Visit | Attending: Cardiovascular Disease | Admitting: Cardiovascular Disease

## 2019-11-19 DIAGNOSIS — I739 Peripheral vascular disease, unspecified: Secondary | ICD-10-CM

## 2019-11-19 DIAGNOSIS — R0989 Other specified symptoms and signs involving the circulatory and respiratory systems: Secondary | ICD-10-CM | POA: Diagnosis not present

## 2019-12-10 ENCOUNTER — Other Ambulatory Visit: Payer: Self-pay | Admitting: Internal Medicine

## 2019-12-30 ENCOUNTER — Ambulatory Visit (INDEPENDENT_AMBULATORY_CARE_PROVIDER_SITE_OTHER): Payer: Medicare Other | Admitting: Specialist

## 2019-12-30 ENCOUNTER — Other Ambulatory Visit: Payer: Self-pay

## 2019-12-30 ENCOUNTER — Encounter: Payer: Self-pay | Admitting: Specialist

## 2019-12-30 VITALS — BP 132/56 | HR 51 | Ht 68.5 in | Wt 186.0 lb

## 2019-12-30 DIAGNOSIS — M47816 Spondylosis without myelopathy or radiculopathy, lumbar region: Secondary | ICD-10-CM

## 2019-12-30 DIAGNOSIS — M4316 Spondylolisthesis, lumbar region: Secondary | ICD-10-CM | POA: Diagnosis not present

## 2019-12-30 DIAGNOSIS — M545 Low back pain, unspecified: Secondary | ICD-10-CM

## 2019-12-30 DIAGNOSIS — M48062 Spinal stenosis, lumbar region with neurogenic claudication: Secondary | ICD-10-CM | POA: Diagnosis not present

## 2019-12-30 DIAGNOSIS — G8929 Other chronic pain: Secondary | ICD-10-CM

## 2019-12-30 DIAGNOSIS — M1711 Unilateral primary osteoarthritis, right knee: Secondary | ICD-10-CM

## 2019-12-30 DIAGNOSIS — M19012 Primary osteoarthritis, left shoulder: Secondary | ICD-10-CM

## 2019-12-30 DIAGNOSIS — M19011 Primary osteoarthritis, right shoulder: Secondary | ICD-10-CM

## 2019-12-30 DIAGNOSIS — M4808 Spinal stenosis, sacral and sacrococcygeal region: Secondary | ICD-10-CM

## 2019-12-30 NOTE — Patient Instructions (Signed)
Plan: Avoid bending, stooping and avoid lifting weights greater than 10 lbs. Avoid prolong standing and walking. Avoid frequent bending and stooping  No lifting greater than 10 lbs. May use ice or moist heat for pain. Weight loss is of benefit. Handicap license is approved. Knee is suffering from osteoarthritis, only real proven treatments are Weight loss,and exercise. Well padded shoes help. Ice or hot shower the knee is suffering from osteoarthritis, only real proven treatments are Weight loss,Arthritis strength tylenol and exercise. Well padded shoes help. Ice the knee 2-3 times a day 15-20 mins at a time.-3 times a day 15-20 mins at a time. Hot showers in the AM.  Injection with steroid may be of benefit. Hemp CBD capsules, amazon.com 5,000-7,000 mg per bottle, 60 capsules per bottle, take one capsule twice a day. Cane in the left hand to use with left leg weight bearing. Follow-Up Instructions: No follow-ups on file.

## 2019-12-30 NOTE — Progress Notes (Signed)
Office Visit Note   Patient: [name]           Date of Birth: [birth year]           MRN: [id number] Visit Date: 12/30/2019              Requested by: [name], MD [address],  [place] [id number] PCP: [name], MD   Assessment & Plan: Visit Diagnoses:  1. Spinal stenosis, lumbar region with neurogenic claudication   2. Spinal stenosis, sacral and sacrococcygeal region   3. Spondylolisthesis, lumbar region   4. Spondylosis without myelopathy or radiculopathy, lumbar region   5. Chronic low back pain, unspecified back pain laterality, unspecified whether sciatica present   6. Primary osteoarthritis of both shoulders   7. Unilateral primary osteoarthritis, right knee     Plan:Plan: Avoid bending, stooping and avoid lifting weights greater than 10 lbs. Avoid prolong standing and walking. Avoid frequent bending and stooping  No lifting greater than 10 lbs. May use ice or moist heat for pain. Weight loss is of benefit. Handicap license is approved. Knee is suffering from osteoarthritis, only real proven treatments are Weight loss,and exercise. Well padded shoes help. Ice or hot shower the knee is suffering from osteoarthritis, only real proven treatments are Weight loss,Arthritis strength tylenol and exercise. Well padded shoes help. Ice the knee 2-3 times a day 15-20 mins at a time.-3 times a day 15-20 mins at a time. Hot showers in the AM.  Injection with steroid may be of benefit. Hemp CBD capsules, [email or web address] 5,000-7,000 mg per bottle, 60 capsules per bottle, take one capsule twice a day. Cane in the left hand to use with left leg weight bearing. Follow-Up Instructions: No follow-ups on file.  Follow-Up Instructions: Return in about 4 weeks (around 01/27/2020).   Orders:  No orders of the defined types were placed in this encounter.  No orders of the defined types were placed in this encounter.     Procedures: No procedures  performed   Clinical Data: Findings:  CLINICAL DATA: Low back pain worse with walking. Weakness in the legs.  EXAM: MRI LUMBAR SPINE WITHOUT CONTRAST  TECHNIQUE: Multiplanar, multisequence MR imaging of the lumbar spine was performed. No intravenous contrast was administered.  COMPARISON: Radiography 09/16/2018  FINDINGS: Segmentation: 5 lumbar type vertebral bodies.  Alignment: 3 mm retrolisthesis L1-2 and L2-3. 3 mm anterolisthesis L4-5.  Vertebrae: Nonspecific 12 mm marrow focus at the superior L1 vertebral body. In a patient with a history of prostate cancer, this could represent a metastasis. No second suspicious focus is identified however. There are discogenic endplate changes on the right at L2-3 which could be associated with back pain.  Conus medullaris and cauda equina: Conus extends to the L1 level. Conus and cauda equina appear normal.  Paraspinal and other soft tissues: Negative  Disc levels:  T11-12 and T12-L1: Negative.  L1-2: 3 mm retrolisthesis. Mild bulging of the disc. Mild narrowing of the lateral recesses and foramina without definite neural compression.  L2-3: 3 mm retrolisthesis. Endplate osteophytes and bulging of the disc. Facet and ligamentous hypertrophy. Multifactorial spinal stenosis with effacement of the subarachnoid space and crowding of the nerve roots. Potential for compressive stenosis at this level. The neural foramina are stenotic as well.  L3-4: Endplate osteophytes and bulging of the disc. Facet and ligamentous hypertrophy. Stenosis of both lateral recesses and neural foramina that could cause neural compression on either or both sides. Foraminal encroachment  more pronounced on the right.  L4-5: 3 mm anterolisthesis due to advanced bilateral facet arthropathy. Mild bulging of the disc. Stenosis of the canal and neural foramina that could cause neural compression on either or both sides.  L5-S1: Mild bulging of the disc.  Mild bilateral facet osteoarthritis. Mild bilateral foraminal narrowing but without visible neural compression.  IMPRESSION: 12 mm marrow space focus in the superior L1 vertebral body. In a patient with a history of prostate cancer, this could represent a metastatic deposit. However, this is an isolated finding which would be somewhat unusual.  L1-2: 3 mm retrolisthesis. Mild bulging of the disc. Mild stenosis of the lateral recesses and foramina but without definite neural compression.  L2-3: Multifactorial spinal stenosis that could cause neural compression on either or both sides. 3 mm retrolisthesis. Bulging of the disc. Facet and ligamentous hypertrophy. Discogenic edema on the right that could be associated with back pain.  L3-4: Bilateral lateral recess and foraminal narrowing that could cause neural compression on either side. Foraminal narrowing more marked on the right. Endplate osteophytes and bulging of the disc in combination with facet hypertrophy.  L4-5: Multifactorial spinal stenosis that could cause neural compression on either or both sides. Bilateral facet arthropathy with 3 mm of anterolisthesis. Mild bulging of the disc.  L5-S1: Endplate osteophytes and bulging of the disc. Facet osteoarthritis. Mild bilateral foraminal narrowing but without definite neural compression.   Electronically Signed By: Nelson Chimes M.D. On: 05/22/2019 11:44    Subjective: Chief Complaint  Patient presents with  . Lower Back - Follow-up  . Right Knee - Follow-up  . Right Shoulder - Follow-up  . Left Shoulder - Follow-up    86 year old male with history of lumbar spinal stenosis and has had cortisone shot in the past without help. He feels like the back is stable and he has intermittant left lateral thigh cramping. He is limited in his standing and walking tolerance, last    Review of Systems  Constitutional: Negative.  Negative for activity change, appetite change,  chills, diaphoresis, fatigue, fever and unexpected weight change.  HENT: Negative.  Negative for congestion, dental problem, drooling, ear discharge, ear pain, facial swelling, hearing loss, mouth sores, nosebleeds, postnasal drip, rhinorrhea, sinus pressure, sinus pain, sneezing, sore throat, tinnitus, trouble swallowing and voice change.   Eyes: Negative.  Negative for photophobia, pain, discharge, redness, itching and visual disturbance.  Respiratory: Negative.  Negative for apnea, cough, choking, chest tightness, shortness of breath, wheezing and stridor.   Cardiovascular: Negative.   Gastrointestinal: Negative.   Endocrine: Negative.  Negative for cold intolerance, heat intolerance, polydipsia, polyphagia and polyuria.  Genitourinary: Negative.  Negative for decreased urine volume, difficulty urinating, discharge, dysuria, enuresis, flank pain, frequency, genital sores, hematuria, penile pain, penile swelling, scrotal swelling, testicular pain and urgency.  Musculoskeletal: Positive for back pain and gait problem. Negative for arthralgias, joint swelling, myalgias, neck pain and neck stiffness.  Skin: Negative.   Allergic/Immunologic: Negative.  Negative for environmental allergies, food allergies and immunocompromised state.  Neurological: Negative for dizziness, tremors, seizures, syncope, facial asymmetry, speech difficulty, weakness, light-headedness, numbness and headaches.  Hematological: Negative.  Negative for adenopathy. Does not bruise/bleed easily.  Psychiatric/Behavioral: Negative.  Negative for agitation, behavioral problems, confusion, decreased concentration, dysphoric mood, hallucinations, self-injury, sleep disturbance and suicidal ideas. The patient is not nervous/anxious and is not hyperactive.      Objective: Vital Signs: BP (!) 132/56 (BP Location: Left Arm, Patient Position: Sitting)   Pulse (!) 51  Ht 5' 8.5" (1.74 m)   Wt 186 lb (84.4 kg)   BMI 27.87 kg/m    Physical Exam Constitutional:      Appearance: He is well-developed.  HENT:     Head: Normocephalic and atraumatic.  Eyes:     Pupils: Pupils are equal, round, and reactive to light.  Pulmonary:     Effort: Pulmonary effort is normal.     Breath sounds: Normal breath sounds.  Abdominal:     General: Bowel sounds are normal.     Palpations: Abdomen is soft.  Musculoskeletal:     Cervical back: Normal range of motion and neck supple.     Lumbar back: Negative right straight leg raise test and negative left straight leg raise test.  Skin:    General: Skin is warm and dry.  Neurological:     Mental Status: He is alert and oriented to person, place, and time.  Psychiatric:        Behavior: Behavior normal.        Thought Content: Thought content normal.        Judgment: Judgment normal.     Back Exam   Tenderness  The patient is experiencing tenderness in the lumbar.  Range of Motion  Extension: abnormal  Flexion: abnormal  Lateral bend right: normal  Lateral bend left: normal  Rotation right: normal  Rotation left: normal   Muscle Strength  Right Quadriceps:  5/5  Left Quadriceps:  5/5  Right Hamstrings:  5/5  Left Hamstrings:  5/5   Tests  Straight leg raise right: negative Straight leg raise left: negative  Reflexes  Patellar: 0/4 Achilles: 0/4  Other  Toe walk: normal Heel walk: normal Sensation: normal Gait: abnormal  Erythema: no back redness Scars: absent      Specialty Comments:  No specialty comments available.  Imaging: No results found.   PMFS History: Patient Active Problem List   Diagnosis Date Noted  . Claudication in peripheral vascular disease ([facility]) 08/22/2017  . Absolute anemia 04/02/2016  . HLD (hyperlipidemia) 04/02/2016  . BP (high blood pressure) 04/02/2016  . Long term current use of anticoagulant 04/02/2016  . Macrocytosis 04/02/2016  . Arthritis, degenerative 04/02/2016  . History of colon polyps 04/02/2016  .  Carcinoma of prostate ([facility]) 04/02/2016  . Abdominal discomfort, epigastric 12/20/2015  . H/O adenomatous polyp of colon 12/20/2015  . Left upper quadrant pain 03/16/2015  . Chronic diastolic heart failure ([facility]) 08/30/2014  . 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 S/P aortic valve replacement with bioprosthetic valve 01/19/2013  . Diabetes mellitus ([facility]) 12/21/2012  . Hyperlipemia 12/21/2012  . H/O prostate cancer 12/21/2012  . Anticoagulated on Coumadin 12/21/2012  . HYPERTENSION, CONTROLLED 09/06/2009  . AORTIC STENOSIS 09/06/2009  . ATRIAL FIBRILLATION, CHRONIC 09/06/2009   Past Medical History:  Diagnosis Date  . Anemia    LOW PLATELETS OTHER DAY  PER PT  . Anticoagulated on Coumadin 12/21/2012  . Aortic stenosis   . Arthritis    "left wrist; back sometimes" (01/21/2013)  . Atrial fibrillation ([facility])    [name], DR COOPER  . Bradycardia   . Exertional shortness of breath   . Heart murmur    "I've had it for years; runs in the family on daddy's side" (01/21/2013)  . HTN (hypertension)   . Hyperlipemia 12/21/2012  . Prostate cancer [facility])    "had 40 tx of radiation in 2009" (01/21/2013)  . S/P aortic valve replacement with bioprosthetic valve 01/19/2013  Transcatheter Aortic Valve Replacement using 63mm Sapien bioprosthetic tissue valve via transapical approach  . Type II diabetes mellitus ([facility])     Family History  Problem Relation Age of Onset  . Diabetes Mother   . Melanoma Mother 10  . Bone cancer Mother 99  . Pancreatic cancer Father 62  . Heart disease Father   . Stroke Sister   . Cancer Brother 20       ? brain cancer    Past Surgical History:  Procedure Laterality Date  . CARDIAC CATHETERIZATION  12/16/2012  . CARDIAC VALVE REPLACEMENT  01/19/2013   AVR  . CATARACT EXTRACTION W/ INTRAOCULAR LENS  IMPLANT, BILATERAL    . EYE SURGERY    . INTRAOPERATIVE TRANSESOPHAGEAL ECHOCARDIOGRAM N/A 01/19/2013   Procedure: INTRAOPERATIVE  TRANSESOPHAGEAL ECHOCARDIOGRAM;  Surgeon: [name], MD;  Location: [facility];  Service: Open Heart Surgery;  Laterality: N/A;  . ORIF FOREARM FRACTURE Left 1954   "compound fx" (01/21/2013)  . TRANSTHORACIC ECHOCARDIOGRAM  09/04/10, 09/07/08   Social History   Occupational History  . Not on file  Tobacco Use  . Smoking status: Former Smoker    Packs/day: 2.00    Years: 22.00    Pack years: 44.00    Types: Cigarettes    Quit date: 09/11/1976    Years since quitting: 43.3  . Smokeless tobacco: Never Used  Substance and Sexual Activity  . Alcohol use: No  . Drug use: No  . Sexual activity: Never

## 2019-12-31 ENCOUNTER — Encounter: Payer: Self-pay | Admitting: Podiatry

## 2019-12-31 ENCOUNTER — Ambulatory Visit: Payer: Medicare Other | Admitting: Podiatry

## 2019-12-31 DIAGNOSIS — M79675 Pain in left toe(s): Secondary | ICD-10-CM | POA: Diagnosis not present

## 2019-12-31 DIAGNOSIS — M79674 Pain in right toe(s): Secondary | ICD-10-CM | POA: Diagnosis not present

## 2019-12-31 DIAGNOSIS — L600 Ingrowing nail: Secondary | ICD-10-CM

## 2019-12-31 DIAGNOSIS — B351 Tinea unguium: Secondary | ICD-10-CM | POA: Diagnosis not present

## 2019-12-31 DIAGNOSIS — E119 Type 2 diabetes mellitus without complications: Secondary | ICD-10-CM

## 2019-12-31 NOTE — Patient Instructions (Addendum)
EPSOM SALT FOOT SOAK INSTRUCTIONS  Shopping List:  A. Plain epsom salt (not scented) B. Neosporin Cream/Ointment or Bacitracin Cream/Ointment C. 1-inch fabric band-aids   1.  Place 1/4 cup of epsom salts in 2 quarts of warm tap water. IF YOU ARE DIABETIC, OR HAVE NEUROPATHY, CHECK THE TEMPERATURE OF THE WATER WITH YOUR ELBOW.  2.  Submerge your foot/feet in the solution and soak for 10-15 minutes.      3.  Next, remove your foot or feet from solution, blot dry the affected area.    4.  Apply antibiotic ointment and cover with fabric band-aid .  5.  This soak should be done once a day for 3days.   6.  Monitor for any signs/symptoms of infection such as redness, swelling, odor, drainage, increased pain, or non-healing of digit.   7.  Please do not hesitate to call the office and speak to a Nurse or Doctor if you have questions.   8.  If you experience fever, chills, nightsweats, nausea or vomiting with worsening of digit, please go to the emergency room.    Peripheral Neuropathy Peripheral neuropathy is a type of nerve damage. It affects nerves that carry signals between the spinal cord and the arms, legs, and the rest of the body (peripheral nerves). It does not affect nerves in the spinal cord or brain. In peripheral neuropathy, one nerve or a group of nerves may be damaged. Peripheral neuropathy is a broad category that includes many specific nerve disorders, like diabetic neuropathy, hereditary neuropathy, and carpal tunnel syndrome. What are the causes? This condition may be caused by:  Diabetes. This is the most common cause of peripheral neuropathy.  Nerve injury.  Pressure or stress on a nerve that lasts a long time.  Lack (deficiency) of B vitamins. This can result from alcoholism, poor diet, or a restricted diet.  Infections.  Autoimmune diseases, such as rheumatoid arthritis and systemic lupus erythematosus.  Nerve diseases that are passed from parent to child  (inherited).  Some medicines, such as cancer medicines (chemotherapy).  Poisonous (toxic) substances, such as lead and mercury.  Too little blood flowing to the legs.  Kidney disease.  Thyroid disease. In some cases, the cause of this condition is not known. What are the signs or symptoms? Symptoms of this condition depend on which of your nerves is damaged. Common symptoms include:  Loss of feeling (numbness) in the feet, hands, or both.  Tingling in the feet, hands, or both.  Burning pain.  Very sensitive skin.  Weakness.  Not being able to move a part of the body (paralysis).  Muscle twitching.  Clumsiness or poor coordination.  Loss of balance.  Not being able to control your bladder.  Feeling dizzy.  Sexual problems. How is this diagnosed? Diagnosing and finding the cause of peripheral neuropathy can be difficult. Your health care provider will take your medical history and do a physical exam. A neurological exam will also be done. This involves checking things that are affected by your brain, spinal cord, and nerves (nervous system). For example, your health care provider will check your reflexes, how you move, and what you can feel. You may have other tests, such as:  Blood tests.  Electromyogram (EMG) and nerve conduction tests. These tests check nerve function and how well the nerves are controlling the muscles.  Imaging tests, such as CT scans or MRI to rule out other causes of your symptoms.  Removing a small piece of nerve to  be examined in a lab (nerve biopsy). This is rare.  Removing and examining a small amount of the fluid that surrounds the brain and spinal cord (lumbar puncture). This is rare. How is this treated? Treatment for this condition may involve:  Treating the underlying cause of the neuropathy, such as diabetes, kidney disease, or vitamin deficiencies.  Stopping medicines that can cause neuropathy, such as chemotherapy.  Medicine  to relieve pain. Medicines may include: ? Prescription or over-the-counter pain medicine. ? Antiseizure medicine. ? Antidepressants. ? Pain-relieving patches that are applied to painful areas of skin.  Surgery to relieve pressure on a nerve or to destroy a nerve that is causing pain.  Physical therapy to help improve movement and balance.  Devices to help you move around (assistive devices). Follow these instructions at home: Medicines  Take over-the-counter and prescription medicines only as told by your health care provider. Do not take any other medicines without first asking your health care provider.  Do not drive or use heavy machinery while taking prescription pain medicine. Lifestyle   Do not use any products that contain nicotine or tobacco, such as cigarettes and e-cigarettes. Smoking keeps blood from reaching damaged nerves. If you need help quitting, ask your health care provider.  Avoid or limit alcohol. Too much alcohol can cause a vitamin B deficiency, and vitamin B is needed for healthy nerves.  Eat a healthy diet. This includes: ? Eating foods that are high in fiber, such as fresh fruits and vegetables, whole grains, and beans. ? Limiting foods that are high in fat and processed sugars, such as fried or sweet foods. General instructions   If you have diabetes, work closely with your health care provider to keep your blood sugar under control.  If you have numbness in your feet: ? Check every day for signs of injury or infection. Watch for redness, warmth, and swelling. ? Wear padded socks and comfortable shoes. These help protect your feet.  Develop a good support system. Living with peripheral neuropathy can be stressful. Consider talking with a mental health specialist or joining a support group.  Use assistive devices and attend physical therapy as told by your health care provider. This may include using a walker or a cane.  Keep all follow-up visits as  told by your health care provider. This is important. Contact a health care provider if:  You have new signs or symptoms of peripheral neuropathy.  You are struggling emotionally from dealing with peripheral neuropathy.  Your pain is not well-controlled. Get help right away if:  You have an injury or infection that is not healing normally.  You develop new weakness in an arm or leg.  You fall frequently. Summary  Peripheral neuropathy is when the nerves in the arms, or legs are damaged, resulting in numbness, weakness, or pain.  There are many causes of peripheral neuropathy, including diabetes, pinched nerves, vitamin deficiencies, autoimmune disease, and hereditary conditions.  Diagnosing and finding the cause of peripheral neuropathy can be difficult. Your health care provider will take your medical history, do a physical exam, and do tests, including blood tests and nerve function tests.  Treatment involves treating the underlying cause of the neuropathy and taking medicines to help control pain. Physical therapy and assistive devices may also help. This information is not intended to replace advice given to you by your health care provider. Make sure you discuss any questions you have with your health care provider. Document Revised: 10/03/2017 Document Reviewed: 12/30/2016  Elsevier Patient Education  [employer].

## 2020-01-01 NOTE — Progress Notes (Signed)
Subjective: [name] presents today for follow up of preventative diabetic foot care and painful mycotic nails b/l that are difficult to trim. Pain interferes with ambulation. Aggravating factors include wearing enclosed shoe gear. Pain is relieved with periodic professional debridement.   He states his right great toe is more painful on today's visit.  No Known Allergies   Objective: There were no vitals filed for this visit.  Vascular Examination:  Capillary refill time to digits immediate b/l, palpable DP pulses b/l, palpable PT pulses b/l, pedal hair absent b/l and skin temperature gradient within normal limits b/l  Dermatological Examination: Pedal skin is thin shiny, atrophic bilaterally, no open wounds bilaterally, no interdigital macerations bilaterally and toenails 1-5 b/l elongated, dystrophic, thickened, crumbly with subungual debris.  Incurvated nailplate b/l great toes with tenderness to palpation. No erythema, no edema, no drainage noted.  Musculoskeletal: Normal muscle strength 5/5 to all lower extremity muscle groups bilaterally, no gross bony deformities bilaterally and no pain crepitus or joint limitation noted with ROM b/l  Neurological: Protective sensation intact 5/5 intact bilaterally with 10g monofilament b/l and vibratory sensation intact b/l  Assessment: 1. Ingrown toenail without infection   2. Pain due to onychomycosis of toenails of both feet   3. Controlled type 2 diabetes mellitus without complication, without long-term current use of insulin ([place])    Plan: 1. Patient unable to tolerate debridement of right great toenail without local anesthetic today. 3 cc's of 50/50 mix of 1% xylocaine plain and 0.5% marcaine plain administered to right hallux after betadine prep. Offending nail borders right debrided and curretaged.  Borders cleansed with alcohol. Antibiotic ointment applied. Patient given written instructions for epsom salt soaks once daily for  three days. Call office if condition does not resolve. Written instructions dispensed to Mr. Speyer. 2.  Remaining toenails 1-5 left, 2-5 right debrided in length and girth.  3.  Continue diabetic foot care principles. 4.  Continue soft, supportive shoe gear daily.  Return in about 9 weeks (around 03/03/2020) for diabetic nail trim.

## 2020-01-27 ENCOUNTER — Ambulatory Visit: Payer: Medicare Other | Admitting: Specialist

## 2020-03-10 ENCOUNTER — Encounter: Payer: Self-pay | Admitting: Podiatry

## 2020-03-10 ENCOUNTER — Other Ambulatory Visit: Payer: Self-pay

## 2020-03-10 ENCOUNTER — Ambulatory Visit: Payer: Medicare Other | Admitting: Podiatry

## 2020-03-10 VITALS — Temp 96.7°F

## 2020-03-10 DIAGNOSIS — M79675 Pain in left toe(s): Secondary | ICD-10-CM

## 2020-03-10 DIAGNOSIS — B351 Tinea unguium: Secondary | ICD-10-CM

## 2020-03-10 DIAGNOSIS — L600 Ingrowing nail: Secondary | ICD-10-CM

## 2020-03-10 DIAGNOSIS — E119 Type 2 diabetes mellitus without complications: Secondary | ICD-10-CM

## 2020-03-10 DIAGNOSIS — M79674 Pain in right toe(s): Secondary | ICD-10-CM

## 2020-03-10 NOTE — Patient Instructions (Signed)
Diabetes Mellitus and Foot Care Foot care is an important part of your health, especially when you have diabetes. Diabetes may cause you to have problems because of poor blood flow (circulation) to your feet and legs, which can cause your skin to:  Become thinner and drier.  Break more easily.  Heal more slowly.  Peel and crack. You may also have nerve damage (neuropathy) in your legs and feet, causing decreased feeling in them. This means that you may not notice minor injuries to your feet that could lead to more serious problems. Noticing and addressing any potential problems early is the best way to prevent future foot problems. How to care for your feet Foot hygiene  Wash your feet daily with warm water and mild soap. Do not use hot water. Then, pat your feet and the areas between your toes until they are completely dry. Do not soak your feet as this can dry your skin.  Trim your toenails straight across. Do not dig under them or around the cuticle. File the edges of your nails with an emery board or nail file.  Apply a moisturizing lotion or petroleum jelly to the skin on your feet and to dry, brittle toenails. Use lotion that does not contain alcohol and is unscented. Do not apply lotion between your toes. Shoes and socks  Wear clean socks or stockings every day. Make sure they are not too tight. Do not wear knee-high stockings since they may decrease blood flow to your legs.  Wear shoes that fit properly and have enough cushioning. Always look in your shoes before you put them on to be sure there are no objects inside.  To break in new shoes, wear them for just a few hours a day. This prevents injuries on your feet. Wounds, scrapes, corns, and calluses  Check your feet daily for blisters, cuts, bruises, sores, and redness. If you cannot see the bottom of your feet, use a mirror or ask someone for help.  Do not cut corns or calluses or try to remove them with medicine.  If you  find a minor scrape, cut, or break in the skin on your feet, keep it and the skin around it clean and dry. You may clean these areas with mild soap and water. Do not clean the area with peroxide, alcohol, or iodine.  If you have a wound, scrape, corn, or callus on your foot, look at it several times a day to make sure it is healing and not infected. Check for: ? Redness, swelling, or pain. ? Fluid or blood. ? Warmth. ? Pus or a bad smell. General instructions  Do not cross your legs. This may decrease blood flow to your feet.  Do not use heating pads or hot water bottles on your feet. They may burn your skin. If you have lost feeling in your feet or legs, you may not know this is happening until it is too late.  Protect your feet from hot and cold by wearing shoes, such as at the beach or on hot pavement.  Schedule a complete foot exam at least once a year (annually) or more often if you have foot problems. If you have foot problems, report any cuts, sores, or bruises to your health care provider immediately. Contact a health care provider if:  You have a medical condition that increases your risk of infection and you have any cuts, sores, or bruises on your feet.  You have an injury that is not   healing.  You have redness on your legs or feet.  You feel burning or tingling in your legs or feet.  You have pain or cramps in your legs and feet.  Your legs or feet are numb.  Your feet always feel cold.  You have pain around a toenail. Get help right away if:  You have a wound, scrape, corn, or callus on your foot and: ? You have pain, swelling, or redness that gets worse. ? You have fluid or blood coming from the wound, scrape, corn, or callus. ? Your wound, scrape, corn, or callus feels warm to the touch. ? You have pus or a bad smell coming from the wound, scrape, corn, or callus. ? You have a fever. ? You have a red line going up your leg. Summary  Check your feet every day  for cuts, sores, red spots, swelling, and blisters.  Moisturize feet and legs daily.  Wear shoes that fit properly and have enough cushioning.  If you have foot problems, report any cuts, sores, or bruises to your health care provider immediately.  Schedule a complete foot exam at least once a year (annually) or more often if you have foot problems. This information is not intended to replace advice given to you by your health care provider. Make sure you discuss any questions you have with your health care provider. Document Revised: 07/14/2019 Document Reviewed: 11/22/2016 Elsevier Patient Education  2020 [employer]  

## 2020-03-16 NOTE — Progress Notes (Signed)
Subjective: DAM BRUEGGEMAN presents today preventative diabetic foot care and painful mycotic nails b/l that are difficult to trim. Pain interferes with ambulation. Aggravating factors include wearing enclosed shoe gear. Pain is relieved with periodic professional debridement. He has h/o chronic ingrown toenails b/l hallux. He states he has been applying Apinol to his great toes.   He voices no new pedal problems on today's visit.  [name], MD is patient's PCP.  Past Medical History:  Diagnosis Date  . Anemia    LOW PLATELETS OTHER DAY  PER PT  . Anticoagulated on Coumadin 12/21/2012  . Aortic stenosis   . Arthritis    "left wrist; back sometimes" (01/21/2013)  . Atrial fibrillation ([place])    [name], DR COOPER  . Bradycardia   . Exertional shortness of breath   . Heart murmur    "I've had it for years; runs in the family on daddy's side" (01/21/2013)  . HTN (hypertension)   . Hyperlipemia 12/21/2012  . Prostate cancer [facility])    "had 40 tx of radiation in 2009" (01/21/2013)  . S/P aortic valve replacement with bioprosthetic valve 01/19/2013   Transcatheter Aortic Valve Replacement using 53mm Sapien bioprosthetic tissue valve via transapical approach  . Type II diabetes mellitus ([place])      Current Outpatient Medications on File Prior to Visit  Medication Sig Dispense Refill  . ACCU-CHEK AVIVA PLUS test strip USE TO CHECK BLOOD SUGARS 1 TO 2 TIMES A DAY  3  . ACCU-CHEK SOFTCLIX LANCETS lancets daily. use as directed  3  . amLODipine (NORVASC) 5 MG tablet Take 5 mg by mouth daily.     . B Complex Vitamins (B COMPLEX 50) TABS Take 1 tablet by mouth daily.     . carvedilol (COREG) 12.5 MG tablet Take 1 tablet by mouth twice daily 180 tablet 3  . cholecalciferol (VITAMIN D) 1000 UNITS tablet Take 1,000 Units by mouth 2 (two) times daily.    Marland Kitchen doxazosin (CARDURA) 8 MG tablet Take 8 mg by mouth daily.     . furosemide (LASIX) 40 MG tablet Take 1 tablet by mouth once daily 90  tablet 2  . glipiZIDE (GLUCOTROL) 5 MG tablet Take 5 mg by mouth 2 (two) times daily before a meal.     . ketoconazole (NIZORAL) 2 % cream APPLY TO BACK SIDE TWICE DAILY AS NEEDED  3  . lisinopril (ZESTRIL) 20 MG tablet Take 1 tablet by mouth once daily 90 tablet 2  . metFORMIN (GLUCOPHAGE) 1000 MG tablet Take 1,000 mg by mouth 2 (two) times daily with a meal.     . NONFORMULARY OR COMPOUNDED ITEM Antifungal solution: Terbinafine 3%, Fluconazole 2%, Tea Tree Oil 5%, Urea 10%, Ibuprofen 2% in DMSO suspension #87mL 1 each 3  . Potassium Gluconate 595 MG CAPS Take 595 mg by mouth 2 (two) times daily.    . rosuvastatin (CRESTOR) 5 MG tablet Take 1 tablet (5 mg total) by mouth daily at 6 PM. 90 tablet 2  . Selenium 200 MCG CAPS Take 200 mcg by mouth every morning.     . vitamin A 8000 UNIT capsule Take 8,000 Units by mouth every morning.    . vitamin B-12 (CYANOCOBALAMIN) 1000 MCG tablet Take 1,000 mcg by mouth 2 (two) times daily.    . vitamin C (ASCORBIC ACID) 500 MG tablet Take 500 mg by mouth 2 (two) times daily.     Marland Kitchen warfarin (COUMADIN) 5 MG tablet     .  zinc gluconate 50 MG tablet Take 50 mg by mouth every morning.      Current Facility-Administered Medications on File Prior to Visit  Medication Dose Route Frequency Provider Last Rate Last Admin  . norepinephrine (LEVOPHED) 8 mg in dextrose 5 % 250 mL infusion  2-50 mcg/min Intravenous Titrated [name], MD         No Known Allergies  Objective: ARSH GRAUEL is a pleasant 86 y.o. y.o. Patient Race: White or Caucasian [1]  male in NAD. AAO x 3.  Vitals:   03/10/20 1402  Temp: (!) 96.7 F (35.9 C)    Vascular Examination: Capillary refill time to digits immediate b/l. Palpable DP pulses b/l. Palpable PT pulses b/l. Pedal hair absent b/l Skin temperature gradient within normal limits b/l. Trace edema noted b/l feet.  Dermatological Examination: Pedal skin is thin shiny, atrophic bilaterally. No open wounds  bilaterally. No interdigital macerations bilaterally. Toenails 1-5 b/l elongated, dystrophic, thickened, crumbly with subungual debris and tenderness to dorsal palpation.   Incurvated nailplate b/l great toes with tenderness to palpation. No erythema, no edema, no drainage noted.  Musculoskeletal: Normal muscle strength 5/5 to all lower extremity muscle groups bilaterally. No gross bony deformities bilaterally. No pain crepitus or joint limitation noted with ROM b/l. Patient ambulates independent of any assistive aids.  Neurological Examination: Protective sensation intact 5/5 intact bilaterally with 10g monofilament b/l.  Assessment: 1. Pain due to onychomycosis of toenails of both feet   2. Ingrown toenail without infection   3. Controlled type 2 diabetes mellitus without complication, without long-term current use of insulin ([place])    Plan: -Examined patient. -No new findings. No new orders. -Continue diabetic foot care principles. Literature dispensed on today.  -Toenails 1-5 b/l were debrided in length and girth with sterile nail nippers and dremel without iatrogenic bleeding. Offending nail borders debrided and curretaged b/l great toes utilizing sterile nail nipper and currette. Borders cleansed with alcohol. Antibiotic ointment applied. No further treatment required by patient. -Patient to continue soft, supportive shoe gear daily. -Patient to report any pedal injuries to medical professional immediately. -Patient/POA to call should there be question/concern in the interim.  Return in about 9 weeks (around 05/12/2020).  Marzetta Board, DPM

## 2020-03-23 ENCOUNTER — Encounter (INDEPENDENT_AMBULATORY_CARE_PROVIDER_SITE_OTHER): Payer: Medicare Other | Admitting: Ophthalmology

## 2020-03-23 ENCOUNTER — Other Ambulatory Visit: Payer: Self-pay

## 2020-03-23 DIAGNOSIS — H353121 Nonexudative age-related macular degeneration, left eye, early dry stage: Secondary | ICD-10-CM | POA: Diagnosis not present

## 2020-03-23 DIAGNOSIS — I10 Essential (primary) hypertension: Secondary | ICD-10-CM

## 2020-03-23 DIAGNOSIS — H33302 Unspecified retinal break, left eye: Secondary | ICD-10-CM

## 2020-03-23 DIAGNOSIS — H43813 Vitreous degeneration, bilateral: Secondary | ICD-10-CM

## 2020-03-23 DIAGNOSIS — H35033 Hypertensive retinopathy, bilateral: Secondary | ICD-10-CM

## 2020-03-23 DIAGNOSIS — H35371 Puckering of macula, right eye: Secondary | ICD-10-CM | POA: Diagnosis not present

## 2020-05-04 ENCOUNTER — Other Ambulatory Visit: Payer: Self-pay | Admitting: Internal Medicine

## 2020-05-17 ENCOUNTER — Ambulatory Visit: Payer: Medicare Other | Admitting: Podiatry

## 2020-05-19 ENCOUNTER — Ambulatory Visit (INDEPENDENT_AMBULATORY_CARE_PROVIDER_SITE_OTHER): Payer: Medicare Other | Admitting: Podiatry

## 2020-05-19 ENCOUNTER — Encounter: Payer: Self-pay | Admitting: Podiatry

## 2020-05-19 ENCOUNTER — Other Ambulatory Visit: Payer: Self-pay

## 2020-05-19 VITALS — Temp 95.2°F

## 2020-05-19 DIAGNOSIS — M79675 Pain in left toe(s): Secondary | ICD-10-CM

## 2020-05-19 DIAGNOSIS — L600 Ingrowing nail: Secondary | ICD-10-CM

## 2020-05-19 DIAGNOSIS — B351 Tinea unguium: Secondary | ICD-10-CM

## 2020-05-19 DIAGNOSIS — M79674 Pain in right toe(s): Secondary | ICD-10-CM

## 2020-05-19 MED ORDER — AMOXICILLIN 500 MG PO CAPS: 500.0000 mg | ORAL_CAPSULE | Freq: Two times a day (BID) | ORAL | 0 refills | Status: DC

## 2020-05-19 NOTE — Progress Notes (Addendum)
Subjective:  [name] presents to clinic today for preventative diabetic foot care with cc of painful, ingrown toenail of R hallux.  Pain has been present off and on for a few weeks.  Attempted treatments include: debridement, curretages and partial nail avulsion..  Current Outpatient Medications on File Prior to Visit  Medication Sig Dispense Refill  . ACCU-CHEK AVIVA PLUS test strip USE TO CHECK BLOOD SUGARS 1 TO 2 TIMES A DAY  3  . ACCU-CHEK SOFTCLIX LANCETS lancets daily. use as directed  3  . amLODipine (NORVASC) 5 MG tablet Take 5 mg by mouth daily.     . B Complex Vitamins (B COMPLEX 50) TABS Take 1 tablet by mouth daily.     . carvedilol (COREG) 12.5 MG tablet Take 1 tablet by mouth twice daily 180 tablet 3  . cholecalciferol (VITAMIN D) 1000 UNITS tablet Take 1,000 Units by mouth 2 (two) times daily.    Marland Kitchen doxazosin (CARDURA) 8 MG tablet Take 8 mg by mouth daily.     . furosemide (LASIX) 40 MG tablet Take 1 tablet by mouth once daily 90 tablet 2  . glipiZIDE (GLUCOTROL) 5 MG tablet Take 5 mg by mouth 2 (two) times daily before a meal.     . ketoconazole (NIZORAL) 2 % cream APPLY TO BACK SIDE TWICE DAILY AS NEEDED  3  . lisinopril (ZESTRIL) 20 MG tablet Take 1 tablet (20 mg total) by mouth daily. Must schedule overdue follow up appt  for further refills 90 tablet 0  . metFORMIN (GLUCOPHAGE) 1000 MG tablet Take 1,000 mg by mouth 2 (two) times daily with a meal.     . NONFORMULARY OR COMPOUNDED ITEM Antifungal solution: Terbinafine 3%, Fluconazole 2%, Tea Tree Oil 5%, Urea 10%, Ibuprofen 2% in DMSO suspension #74mL 1 each 3  . Potassium Gluconate 595 MG CAPS Take 595 mg by mouth 2 (two) times daily.    . rosuvastatin (CRESTOR) 5 MG tablet Take 1 tablet (5 mg total) by mouth daily at 6 PM. 90 tablet 2  . Selenium 200 MCG CAPS Take 200 mcg by mouth every morning.     . vitamin A 8000 UNIT capsule Take 8,000 Units by mouth every morning.    . vitamin B-12 (CYANOCOBALAMIN) 1000  MCG tablet Take 1,000 mcg by mouth 2 (two) times daily.    . vitamin C (ASCORBIC ACID) 500 MG tablet Take 500 mg by mouth 2 (two) times daily.     Marland Kitchen warfarin (COUMADIN) 5 MG tablet     . zinc gluconate 50 MG tablet Take 50 mg by mouth every morning.      Current Facility-Administered Medications on File Prior to Visit  Medication Dose Route Frequency Provider Last Rate Last Admin  . norepinephrine (LEVOPHED) 8 mg in dextrose 5 % 250 mL infusion  2-50 mcg/min Intravenous Titrated [name], MD         No Known Allergies   Objective: [name] is a pleasant 86 y.o. Caucasian male WD, WN in NAD.Marland Kitchen AAO x 3.   Vascular: Capillary refill time to digits immediate b/l. Palpable pedal pulses b/l LE. Pedal hair absent. Lower extremity skin temperature gradient within normal limits. Nonpitting edema noted b/l lower extremities.  Dermatological0; Pedal skin with normal turgor, texture and tone bilaterally. No open wounds bilaterally. No interdigital macerations bilaterally. Toenails 1-5 left, R 2nd toe, R 3rd toe, R 4th toe and R 5th toe elongated, discolored, dystrophic, thickened, and crumbly with subungual debris  and tenderness to dorsal palpation. Incurvated nailplate medial and lateral border(s) R hallux.  Nail border hypertrophy present. There is tenderness to palpation. Sign(s) of infection: no clinical signs of infection noted on examination today..  R hallux noted to be incurvated at medial and lateral borders with tenderness to palpation.  Clinical signs noted today: no clinical signs of infection noted on examination today.  Musculoskeletal Normal muscle strength 5/5 to all lower extremity muscle groups bilaterally. No pain crepitus or joint limitation noted with ROM b/l. No gross bony deformities bilaterally. Patient ambulates independent of any assistive aids.  Neurological Protective sensation intact 5/5 intact bilaterally with 10g monofilament b/l.  Assessment: 1. Pain  due to onychomycosis of toenails of both feet   2. Ingrown nail of great toe of left foot   3. Pain in toe of right foot    Plan: 1. Discussed diagnosis of ingrown toenail with treatment options. 2. Patient/POA agreed to have temporary partial nail avulsion of both borders of  R hallux. 3. Consent form signed and in chart. 4. Prepped great toe with chlorhexidine/betadine. 5. Local injection of  3 cc's 50/50 mix of 2% xylocaine plain and 0.5% marcaine plain administered via hallux block to R hallux. Offending nail borders freed utilizing elevator. Offending nail borders removed with hemostat. Digit cleansed with alcohol. Light bleeding controlled with Lumicain Hemostatic Solution. Topical antibiotic and light dressng applied to digit. 6. Patient tolerated local anesthesia and procedure well with no complications. 7. Discused post-procedure instructions of epsom salt warm water soaks and dispensed written handout. 8. Rx for oral antibiotics: Amoxicillin 500 mg po bid x 10 days. 9. -Toenails 1-5 left, R 2nd toe, R 3rd toe, R 4th toe and R 5th toe debrided in length and girth without iatrogenic bleeding with sterile nail nipper and dremel.  10. -Patient to report any pedal injuries to medical professional immediately. 11. -Patient to continue soft, supportive shoe gear daily. 12. -Patient/POA to call should there be question/concern in the interim. 13. Patient/POA related understanding of post-procedure instructions.  Return in about 2 weeks (around 06/02/2020) for postop nail avulsion, temporary, right hallux. Marzetta Board, DPM

## 2020-05-19 NOTE — Patient Instructions (Signed)
EPSOM SALT FOOT SOAK INSTRUCTIONS  *IF YOU HAVE BEEN PRESCRIBED ANTIBIOTICS, TAKE AS INSTRUCTED UNTIL ALL ARE GONE*  Shopping List:  A. Plain epsom salt (not scented) B. Neosporin Cream/Ointment or Bacitracin Cream/Ointment (or prescribed antiobiotic drops/cream/ointment) C. 1-inch fabric band-aids  1.  Place 1/4 cup of epsom salts in 2 quarts of warm tap water. IF YOU ARE DIABETIC, OR HAVE NEUROPATHY, CHECK THE TEMPERATURE OF THE WATER WITH YOUR ELBOW.  2.  Submerge your foot/feet in the solution and soak for 10-15 minutes.      3.  Next, remove your foot/feet from solution, blot dry the affected area.    4.  Apply light amount of antibiotic cream/ointment and cover with fabric band-aid .  5.  This soak should be done once a day for 14 days.   6.  Monitor for any signs/symptoms of infection such as redness, swelling, odor, drainage, increased pain, or non-healing of digit.   7.  Please do not hesitate to call the office and speak to a Nurse or Doctor if you have questions.   8.  If you experience fever, chills, nightsweats, nausea or vomiting with worsening of digit, please go to the emergency room.   

## 2020-05-25 ENCOUNTER — Other Ambulatory Visit: Payer: Self-pay | Admitting: Podiatry

## 2020-05-25 ENCOUNTER — Telehealth: Payer: Self-pay | Admitting: Podiatry

## 2020-05-25 MED ORDER — NONFORMULARY OR COMPOUNDED ITEM: 0 refills | Status: DC

## 2020-05-25 NOTE — Telephone Encounter (Signed)
I spoke with [place] and informed of the [employer] Magic Mouthwash order and instructions.

## 2020-05-25 NOTE — Addendum Note (Signed)
Addended by: Harriett Sine D on: 05/25/2020 04:41 PM   Modules accepted: Orders

## 2020-05-25 NOTE — Telephone Encounter (Signed)
Pt called stating that the antibiotic was killing him and wants someone to call him he also stated that its not working for him

## 2020-05-25 NOTE — Progress Notes (Signed)
Patient called stating antibiotic caused burning of mouth, lips and tongue. Discontinued antibiotic. Order sent to [facility] for Magic Mouthwash to swish and spit. RN informed patient if to go to ED with swelling of face, mouth, lips or difficulty breathing.   I spoke to patient on the phone at 17:18. He states he discontinued antibiotic. He is having no SOB or swelling of face, mouth, lips. I instructed him to call 911 or go to ED if he does.   He will pick up Magic Mouthwash. States toe is a little sore, but fine. Thanked me for checking on him.

## 2020-05-25 NOTE — Telephone Encounter (Signed)
I spoke with pt and he states he has burning of the mouth, lips and tongue and has stopped the medication. I offered pt [employer] Magic Mouthwash to swish and spit. Pt agreed.

## 2020-05-25 NOTE — Telephone Encounter (Signed)
Left message for pt to call with toe status and to go to the ED if swelling of face, mouth or lips, difficulty breathing or shortness of breath, and to call me tomorrow.

## 2020-05-26 NOTE — Telephone Encounter (Signed)
Dr. Elisha Ponder stated through [place] she had contact pt later in the evening and he stated his toe was sore, and he would pick up the magic mouthwash.

## 2020-06-02 ENCOUNTER — Encounter: Payer: Self-pay | Admitting: Podiatry

## 2020-06-02 ENCOUNTER — Other Ambulatory Visit: Payer: Self-pay

## 2020-06-02 ENCOUNTER — Ambulatory Visit (INDEPENDENT_AMBULATORY_CARE_PROVIDER_SITE_OTHER): Payer: Medicare Other | Admitting: Podiatry

## 2020-06-02 DIAGNOSIS — L600 Ingrowing nail: Secondary | ICD-10-CM

## 2020-06-02 DIAGNOSIS — E1142 Type 2 diabetes mellitus with diabetic polyneuropathy: Secondary | ICD-10-CM

## 2020-06-03 NOTE — Progress Notes (Signed)
Subjective:  [name] presents today s/p temporary partial nail avulsion medial and lateral border R hallux.  Patient states he has been following post procedure instructions.  He did discontinue the amoxicillin as noted in previous note.  We did document this as an allergy.  Objective: There were no vitals filed for this visit.  86 y.o. male WD, WN IN NAD. AAO X 3.  Vascular examination: Capillary refill time to digits immediate b/l. Palpable pedal pulses b/l LE. Pedal hair absent. Lower extremity skin temperature gradient within normal limits. No pain with calf compression b/l. Nonpitting edema noted b/l lower extremities.  Dermatological examination: Pedal skin with normal turgor, texture and tone bilaterally. No open wounds bilaterally. No interdigital macerations bilaterally. Toenails 1-5 b/l well maintained with adequate length. No erythema, no edema, no drainage, no flocculence. Procedure site of R hallux noted to be completely healed with no erythema, no edema, no drainage, no purulence.   Musculoskeletal examination: Normal muscle strength 5/5 to all lower extremity muscle groups bilaterally. No pain crepitus or joint limitation noted with ROM b/l. No gross bony deformities bilaterally. Patient ambulates independent of any assistive aids.  Neurological examination: Protective sensation intact 5/5 intact bilaterally with 10g monofilament b/l.  Assessment: Status post temporary partial nail avulsion medial and lateral borders of the right great toe, healed Ingrown toenail right great toe  Plan: -Examined patient. -Patient to report any pedal injuries to medical professional immediately. -Resume normal daily activity. -Patient to continue soft, supportive shoe gear daily. -Patient/POA to call should there be question/concern in the interim.  Return in about 7 weeks (around 07/21/2020) for nail trim.

## 2020-06-22 ENCOUNTER — Encounter (HOSPITAL_BASED_OUTPATIENT_CLINIC_OR_DEPARTMENT_OTHER): Payer: Medicare Other | Attending: Internal Medicine | Admitting: Internal Medicine

## 2020-06-22 DIAGNOSIS — Z8546 Personal history of malignant neoplasm of prostate: Secondary | ICD-10-CM | POA: Diagnosis not present

## 2020-06-22 DIAGNOSIS — Z880 Allergy status to penicillin: Secondary | ICD-10-CM | POA: Diagnosis not present

## 2020-06-22 DIAGNOSIS — I872 Venous insufficiency (chronic) (peripheral): Secondary | ICD-10-CM | POA: Insufficient documentation

## 2020-06-22 DIAGNOSIS — I4891 Unspecified atrial fibrillation: Secondary | ICD-10-CM | POA: Insufficient documentation

## 2020-06-22 DIAGNOSIS — Z833 Family history of diabetes mellitus: Secondary | ICD-10-CM | POA: Insufficient documentation

## 2020-06-22 DIAGNOSIS — Z923 Personal history of irradiation: Secondary | ICD-10-CM | POA: Diagnosis not present

## 2020-06-22 DIAGNOSIS — E1151 Type 2 diabetes mellitus with diabetic peripheral angiopathy without gangrene: Secondary | ICD-10-CM | POA: Diagnosis not present

## 2020-06-22 DIAGNOSIS — Z809 Family history of malignant neoplasm, unspecified: Secondary | ICD-10-CM | POA: Diagnosis not present

## 2020-06-22 DIAGNOSIS — Z953 Presence of xenogenic heart valve: Secondary | ICD-10-CM | POA: Insufficient documentation

## 2020-06-22 DIAGNOSIS — Z8249 Family history of ischemic heart disease and other diseases of the circulatory system: Secondary | ICD-10-CM | POA: Diagnosis not present

## 2020-06-22 DIAGNOSIS — I509 Heart failure, unspecified: Secondary | ICD-10-CM | POA: Insufficient documentation

## 2020-06-22 DIAGNOSIS — E1142 Type 2 diabetes mellitus with diabetic polyneuropathy: Secondary | ICD-10-CM | POA: Insufficient documentation

## 2020-06-22 DIAGNOSIS — I110 Hypertensive heart disease with heart failure: Secondary | ICD-10-CM | POA: Diagnosis not present

## 2020-06-22 DIAGNOSIS — Z87891 Personal history of nicotine dependence: Secondary | ICD-10-CM | POA: Diagnosis not present

## 2020-06-22 DIAGNOSIS — I2510 Atherosclerotic heart disease of native coronary artery without angina pectoris: Secondary | ICD-10-CM | POA: Diagnosis not present

## 2020-06-22 NOTE — Progress Notes (Addendum)
[name], [name] ([id number]) Visit Report for 06/22/2020 Chief Complaint Document Details Patient Name: Date of Service: [name], [name] 06/22/2020 2:45 PM Medical Record Number: [id number] Patient Account Number: [id number] Date of Birth/Sex: Treating RN: 08/30/1933 (86 y.o. Male) Levan Hurst Primary Care Provider: [name] Other Clinician: Referring Provider: Treating Provider/Extender: [name] in Treatment: 0 Information Obtained from: Patient Chief Complaint 06/22/2020; patient comes in today out of concern for increased swelling in his lower legs Electronic Signature(s) Signed: 06/22/2020 5:57:44 PM By: Linton Ham MD Entered By: Linton Ham on 06/22/2020 16:03:43 -------------------------------------------------------------------------------- HPI Details Patient Name: Date of Service: Aaron Salon D. 06/22/2020 2:45 PM Medical Record Number: [id number] Patient Account Number: [id number] Date of Birth/Sex: Treating RN: 07/04/1933 (87 y.o. Male) Levan Hurst Primary Care Provider: [name] Other Clinician: Referring Provider: Treating Provider/Extender: [name] in Treatment: 0 History of Present Illness HPI Description: ADMISSION 06/22/2020 This is an 86 year old man who tells [place] that roughly a month ago he noted increased swelling of his lower legs with weeping fluid. He felt his legs were "about to pop]. He was in this clinic in 2015 with venous insufficiency ulcers. He has zippered stockings but he did not bring them in. He states that he went to see his cardiologist and his amlodipine was discontinued and this is helped with the edema in his legs. He is wearing his stockings and the weeping has stopped. The patient had arterial studies in January of this year. This showed noncompressible waveforms bilaterally but with great toe pressures at 0.76 on the right and 0.61 on the left. Waveforms  were biphasic and triphasic. Past medical history includes type 2 diabetes with peripheral neuropathy, history of venous insufficiency, spinal stenosis, ingrown toenails followed by podiatry, coronary artery disease and the patient tells me he has had a bovine aortic valve replacement Electronic Signature(s) Signed: 06/22/2020 5:57:44 PM By: Linton Ham MD Entered By: Linton Ham on 06/22/2020 16:05:39 -------------------------------------------------------------------------------- Physical Exam Details Patient Name: Date of Service: Aaron Salon D. 06/22/2020 2:45 PM Medical Record Number: [id number] Patient Account Number: [id number] Date of Birth/Sex: Treating RN: June 13, 1933 (86 y.o. Male) Levan Hurst Primary Care Provider: [name] Other Clinician: Referring Provider: Treating Provider/Extender: [name] in Treatment: 0 Constitutional Patient is hypertensive.. Pulse regular and within target range for patient.Marland Kitchen Respirations regular, non-labored and within target range.. Temperature is normal and within the target range for the patient.Marland Kitchen Appears in no distress. Respiratory work of breathing is normal. Bilateral breath sounds are clear and equal in all lobes with no wheezes, rales or rhonchi.. Cardiovascular What sounds like aortic insufficiency murmur 2 out of 6 decrescendo blowing murmur. Jugular venous pressure is not elevated. Pedal pulses palpable and strong bilaterally.. Edema present in both extremities. However this is controlled. There is no open wounds. Integumentary (Hair, Skin) Other than marked hemosiderin deposition I see no other systemic issue. He does have what looks like solar skin damage in the dorsal aspect of both forearms.. Notes Wound exam; the patient has no open wounds. He has chronic hemosiderin deposition in his bilateral lower legs. He has some nonpitting edema. Electronic Signature(s) Signed: 06/22/2020 5:57:44  PM By: Linton Ham MD Entered By: Linton Ham on 06/22/2020 16:07:11 -------------------------------------------------------------------------------- Physician Orders Details Patient Name: Date of Service: Aaron Salon D. 06/22/2020 2:45 PM Medical Record Number: [id number] Patient Account Number: [id number] Date of Birth/Sex: Treating RN: October 18, 1933 (86 y.o. Male) Levan Hurst Primary Care Provider: Orland Mustard,  [name] Other Clinician: Referring Provider: Treating Provider/Extender: [name] in Treatment: 0 Verbal / Phone Orders: No Diagnosis Coding Discharge From [facility] Discharge from Ault lotion - both legs daily (such as eucerin, gold bond, bag balm, jergens) Edema Control Avoid standing for long periods of time Elevate legs to the level of the heart or above for 30 minutes daily and/or when sitting, a frequency of: Support Garment 20-30 mm/Hg pressure to: - to both legs daily, apply first thing in the morning, remove at night before bed Electronic Signature(s) Signed: 06/22/2020 5:36:57 PM By: Levan Hurst RN, BSN Signed: 06/22/2020 5:57:44 PM By: Linton Ham MD Entered By: Levan Hurst on 06/22/2020 15:51:02 -------------------------------------------------------------------------------- Problem List Details Patient Name: Date of Service: Aaron Salon D. 06/22/2020 2:45 PM Medical Record Number: [id number] Patient Account Number: [id number] Date of Birth/Sex: Treating RN: 01-10-1933 (87 y.o. Male) Levan Hurst Primary Care Provider: [name] Other Clinician: Referring Provider: Treating Provider/Extender: [name] in Treatment: 0 Active Problems ICD-10 Encounter Code Description Active Date MDM Diagnosis I87.323 Chronic venous hypertension (idiopathic) with inflammation of bilateral lower 06/22/2020 No Yes extremity Inactive  Problems Resolved Problems Electronic Signature(s) Signed: 06/22/2020 5:57:44 PM By: Linton Ham MD Entered By: Linton Ham on 06/22/2020 15:55:12 -------------------------------------------------------------------------------- Progress Note Details Patient Name: Date of Service: Aaron Salon D. 06/22/2020 2:45 PM Medical Record Number: [id number] Patient Account Number: [id number] Date of Birth/Sex: Treating RN: Oct 13, 1933 (87 y.o. Male) Levan Hurst Primary Care Provider: [name] Other Clinician: Referring Provider: Treating Provider/Extender: [name] in Treatment: 0 Subjective Chief Complaint Information obtained from Patient 06/22/2020; patient comes in today out of concern for increased swelling in his lower legs History of Present Illness (HPI) ADMISSION 06/22/2020 This is an 86 year old man who tells [place] that roughly a month ago he noted increased swelling of his lower legs with weeping fluid. He felt his legs were "about to pop]. He was in this clinic in 2015 with venous insufficiency ulcers. He has zippered stockings but he did not bring them in. He states that he went to see his cardiologist and his amlodipine was discontinued and this is helped with the edema in his legs. He is wearing his stockings and the weeping has stopped. The patient had arterial studies in January of this year. This showed noncompressible waveforms bilaterally but with great toe pressures at 0.76 on the right and 0.61 on the left. Waveforms were biphasic and triphasic. Past medical history includes type 2 diabetes with peripheral neuropathy, history of venous insufficiency, spinal stenosis, ingrown toenails followed by podiatry, coronary artery disease and the patient tells me he has had a bovine aortic valve replacement Patient History Information obtained from Patient. Allergies amoxicillin Family History Cancer - Mother,Father,Siblings, Diabetes -  Mother, Heart Disease - Father, Stroke - Siblings, No family history of Hereditary Spherocytosis, Hypertension, Kidney Disease, Lung Disease, Seizures, Thyroid Problems, Tuberculosis. Social History Former smoker, Alcohol Use - Never, Drug Use - No History, Caffeine Use - Never. Medical History Cardiovascular Patient has history of Arrhythmia - afib, Congestive Heart Failure, Coronary Artery Disease, Hypertension, Peripheral Arterial Disease Endocrine Patient has history of Type II Diabetes Integumentary (Skin) Denies history of History of Burn Oncologic Patient has history of Received Chemotherapy, Received Radiation Patient is treated with Oral Agents. Blood sugar is tested. Medical A Surgical History Notes nd Cardiovascular aortic valve replacement Genitourinary prostate cancer Integumentary (Skin) legs Musculoskeletal arthritis Oncologic prostate cancer  Review of Systems (ROS) Constitutional Symptoms (General Health) Denies complaints or symptoms of Fatigue, Fever, Chills, Marked Weight Change. Eyes Complains or has symptoms of Glasses / Contacts - glasses. Denies complaints or symptoms of Dry Eyes, Vision Changes. Ear/Nose/Mouth/Throat Denies complaints or symptoms of Chronic sinus problems or rhinitis. Respiratory Denies complaints or symptoms of Chronic or frequent coughs, Shortness of Breath. Gastrointestinal Denies complaints or symptoms of Frequent diarrhea, Nausea, Vomiting. Genitourinary Denies complaints or symptoms of Frequent urination. Integumentary (Skin) Complains or has symptoms of Wounds. Musculoskeletal Denies complaints or symptoms of Muscle Pain, Muscle Weakness. Psychiatric Denies complaints or symptoms of Claustrophobia, Suicidal. Objective Constitutional Patient is hypertensive.. Pulse regular and within target range for patient.Marland Kitchen Respirations regular, non-labored and within target range.. Temperature is normal and within the target range for  the patient.Marland Kitchen Appears in no distress. Vitals Time Taken: 2:40 PM, Height: 68 in, Source: Stated, Weight: 187 lbs, Source: Stated, BMI: 28.4, Temperature: 97.8 F, Pulse: 68 bpm, Respiratory Rate: 20 breaths/min, Blood Pressure: 160/66 mmHg, Capillary Blood Glucose: 174 mg/dl. General Notes: patient stated last CBG was 174 Respiratory work of breathing is normal. Bilateral breath sounds are clear and equal in all lobes with no wheezes, rales or rhonchi.. Cardiovascular What sounds like aortic insufficiency murmur 2 out of 6 decrescendo blowing murmur. Jugular venous pressure is not elevated. Pedal pulses palpable and strong bilaterally.. Edema present in both extremities. However this is controlled. There is no open wounds. General Notes: Wound exam; the patient has no open wounds. He has chronic hemosiderin deposition in his bilateral lower legs. He has some nonpitting edema. Integumentary (Hair, Skin) Other than marked hemosiderin deposition I see no other systemic issue. He does have what looks like solar skin damage in the dorsal aspect of both forearms.. Assessment Active Problems ICD-10 Chronic venous hypertension (idiopathic) with inflammation of bilateral lower extremity Plan Discharge From [facility] Services: Discharge from [place] Skin Barriers/Peri-Wound Care: Moisturizing lotion - both legs daily (such as eucerin, gold bond, bag balm, jergens) Edema Control: Avoid standing for long periods of time Elevate legs to the level of the heart or above for 30 minutes daily and/or when sitting, a frequency of: Support Garment 20-30 mm/Hg pressure to: - to both legs daily, apply first thing in the morning, remove at night before bed 1. The patient has no open wound 2. We recommended continued use of his compression stockings 20/30, leg elevation when he is sitting and skin lubrication at at bedtime 3. It sounds though we had amlodipine discontinued by his cardiologist which is  really help with the lower extremity edema which was the predominant reason that he was coming in here today. Electronic Signature(s) Signed: 06/22/2020 5:57:44 PM By: Linton Ham MD Entered By: Linton Ham on 06/22/2020 16:08:01 -------------------------------------------------------------------------------- HxROS Details Patient Name: Date of Service: Aaron Salon D. 06/22/2020 2:45 PM Medical Record Number: [id number] Patient Account Number: [id number] Date of Birth/Sex: Treating RN: 08-09-1933 (87 y.o. Male) Kela Millin Primary Care Provider: [name] Other Clinician: Referring Provider: Treating Provider/Extender: [name] in Treatment: 0 Information Obtained From Patient Constitutional Symptoms (General Health) Complaints and Symptoms: Negative for: Fatigue; Fever; Chills; Marked Weight Change Eyes Complaints and Symptoms: Positive for: Glasses / Contacts - glasses Negative for: Dry Eyes; Vision Changes Ear/Nose/Mouth/Throat Complaints and Symptoms: Negative for: Chronic sinus problems or rhinitis Respiratory Complaints and Symptoms: Negative for: Chronic or frequent coughs; Shortness of Breath Gastrointestinal Complaints and Symptoms: Negative for: Frequent diarrhea; Nausea; Vomiting Genitourinary Complaints  and Symptoms: Negative for: Frequent urination Medical History: Past Medical History Notes: prostate cancer Integumentary (Skin) Complaints and Symptoms: Positive for: Wounds Medical History: Negative for: History of Burn Past Medical History Notes: legs Musculoskeletal Complaints and Symptoms: Negative for: Muscle Pain; Muscle Weakness Medical History: Past Medical History Notes: arthritis Psychiatric Complaints and Symptoms: Negative for: Claustrophobia; Suicidal Hematologic/Lymphatic Cardiovascular Medical History: Positive for: Arrhythmia - afib; Congestive Heart Failure; Coronary Artery  Disease; Hypertension; Peripheral Arterial Disease Past Medical History Notes: aortic valve replacement Endocrine Medical History: Positive for: Type II Diabetes Treated with: Oral agents Blood sugar tested every day: Yes Tested : Immunological Oncologic Medical History: Positive for: Received Chemotherapy; Received Radiation Past Medical History Notes: prostate cancer Immunizations Pneumococcal Vaccine: Received Pneumococcal Vaccination: No Implantable Devices No devices added Family and Social History Cancer: Yes - Mother,Father,Siblings; Diabetes: Yes - Mother; Heart Disease: Yes - Father; Hereditary Spherocytosis: No; Hypertension: No; Kidney Disease: No; Lung Disease: No; Seizures: No; Stroke: Yes - Siblings; Thyroid Problems: No; Tuberculosis: No; Former smoker; Alcohol Use: Never; Drug Use: No History; Caffeine Use: Never; Financial Concerns: No; Food, Clothing or Shelter Needs: No; Support System Lacking: No; Transportation Concerns: No Electronic Signature(s) Signed: 06/22/2020 5:22:44 PM By: Kela Millin Signed: 06/22/2020 5:57:44 PM By: Linton Ham MD Entered By: Kela Millin on 06/22/2020 14:59:00 -------------------------------------------------------------------------------- SuperBill Details Patient Name: Date of Service: Aaron Salon D. 06/22/2020 Medical Record Number: [id number] Patient Account Number: [id number] Date of Birth/Sex: Treating RN: [birth year] (87 y.o. Male) Levan Hurst Primary Care Provider: [name] Other Clinician: Referring Provider: Treating Provider/Extender: [name] in Treatment: 0 Diagnosis Coding ICD-10 Codes Code Description [phone] Chronic venous hypertension (idiopathic) with inflammation of bilateral lower extremity Facility Procedures CPT4 Code: [id number] Description: 49449 - WOUND CARE VISIT-LEV 3 EST PT Modifier: Quantity: 1 Physician Procedures : CPT4 Code Description  Modifier [id number] - WC PHYS LEVEL 2 - NEW PT ICD-10 Diagnosis Description I87.323 Chronic venous hypertension (idiopathic) with inflammation of bilateral lower extremity Quantity: 1 Electronic Signature(s) Signed: 06/26/2020 4:21:43 PM By: Levan Hurst RN, BSN Signed: 06/26/2020 4:33:08 PM By: Linton Ham MD Previous Signature: 06/22/2020 5:57:44 PM Version By: Linton Ham MD Entered By: Levan Hurst on 06/26/2020 11:36:50

## 2020-06-22 NOTE — Progress Notes (Signed)
[name], [name] ([id number]) Visit Report for 06/22/2020 Allergy List Details Patient Name: Date of Service: [name], [name] 06/22/2020 2:45 PM Medical Record Number: [id number] Patient Account Number: [id number] Date of Birth/Sex: Treating RN: [birth year] (86 y.o. Marvis Repress Primary Care Cathie Bonnell: [name] Other Clinician: Referring Garvin Ellena: Treating Fredie Majano/Extender: [name] in Treatment: 0 Allergies Active Allergies amoxicillin Allergy Notes Electronic Signature(s) Signed: 06/22/2020 5:22:44 PM By: Kela Millin Entered By: Kela Millin on 06/22/2020 14:48:58 -------------------------------------------------------------------------------- Arrival Information Details Patient Name: Date of Service: Aaron Salon D. 06/22/2020 2:45 PM Medical Record Number: [id number] Patient Account Number: [id number] Date of Birth/Sex: Treating RN: Nov 12, 1932 (86 y.o. Marvis Repress Primary Care Shanasia Ibrahim: [name] Other Clinician: Referring Kratos Ruscitti: Treating Quinlan Vollmer/Extender: [name] in Treatment: 0 Visit Information Patient Arrived: Lyndel Pleasure Time: 14:45 Accompanied By: self Transfer Assistance: None Patient Identification Verified: Yes Secondary Verification Process Completed: Yes Patient Has Alerts: Yes Patient Alerts: Patient on Blood Thinner Right ABI:NON COMPRESS Left ABI: NON COMPRESS Right TBI: 0.76 Left TBI:0.61 History Since Last Visit Electronic Signature(s) Signed: 06/22/2020 5:22:44 PM By: Kela Millin Entered By: Kela Millin on 06/22/2020 14:47:40 -------------------------------------------------------------------------------- Clinic Level of Care Assessment Details Patient Name: Date of Service: [name], [name] 06/22/2020 2:45 PM Medical Record Number: [id number] Patient Account Number: [id number] Date of Birth/Sex: Treating RN: [birth year] (86 y.o. Janyth Contes Primary Care Xai Frerking: [name] Other Clinician: Referring Hamzeh Tall: Treating Hagen Tidd/Extender: [name] in Treatment: 0 Clinic Level of Care Assessment Items TOOL 2 Quantity Score X- 1 0 Use when only an EandM is performed on the INITIAL visit ASSESSMENTS - Nursing Assessment / Reassessment X- 1 20 General Physical Exam (combine w/ comprehensive assessment (listed just below) when performed on new pt. evals) X- 1 25 Comprehensive Assessment (HX, ROS, Risk Assessments, Wounds Hx, etc.) ASSESSMENTS - Wound and Skin A ssessment / Reassessment []  - 0 Simple Wound Assessment / Reassessment - one wound []  - 0 Complex Wound Assessment / Reassessment - multiple wounds []  - 0 Dermatologic / Skin Assessment (not related to wound area) ASSESSMENTS - Ostomy and/or Continence Assessment and Care []  - 0 Incontinence Assessment and Management []  - 0 Ostomy Care Assessment and Management (repouching, etc.) PROCESS - Coordination of Care X - Simple Patient / Family Education for ongoing care 1 15 []  - 0 Complex (extensive) Patient / Family Education for ongoing care X- 1 10 Staff obtains Programmer, systems, Records, T Results / Process Orders est []  - 0 Staff telephones HHA, Nursing Homes / Clarify orders / etc []  - 0 Routine Transfer to another Facility (non-emergent condition) []  - 0 Routine Hospital Admission (non-emergent condition) []  - 0 New Admissions / Biomedical engineer / Ordering NPWT Apligraf, etc. , []  - 0 Emergency Hospital Admission (emergent condition) X- 1 10 Simple Discharge Coordination []  - 0 Complex (extensive) Discharge Coordination PROCESS - Special Needs []  - 0 Pediatric / Minor Patient Management []  - 0 Isolation Patient Management []  - 0 Hearing / Language / Visual special needs []  - 0 Assessment of Community assistance (transportation, D/C planning, etc.) []  - 0 Additional assistance / Altered  mentation []  - 0 Support Surface(s) Assessment (bed, cushion, seat, etc.) INTERVENTIONS - Wound Cleansing / Measurement []  - 0 Wound Imaging (photographs - any number of wounds) []  - 0 Wound Tracing (instead of photographs) []  - 0 Simple Wound Measurement - one wound []  - 0 Complex Wound Measurement - multiple wounds []  -  0 Simple Wound Cleansing - one wound []  - 0 Complex Wound Cleansing - multiple wounds INTERVENTIONS - Wound Dressings []  - 0 Small Wound Dressing one or multiple wounds []  - 0 Medium Wound Dressing one or multiple wounds []  - 0 Large Wound Dressing one or multiple wounds []  - 0 Application of Medications - injection INTERVENTIONS - Miscellaneous []  - 0 External ear exam []  - 0 Specimen Collection (cultures, biopsies, blood, body fluids, etc.) []  - 0 Specimen(s) / Culture(s) sent or taken to Lab for analysis []  - 0 Patient Transfer (multiple staff / Harrel Lemon Lift / Similar devices) []  - 0 Simple Staple / Suture removal (25 or less) []  - 0 Complex Staple / Suture removal (26 or more) []  - 0 Hypo / Hyperglycemic Management (close monitor of Blood Glucose) []  - 0 Ankle / Brachial Index (ABI) - do not check if billed separately Has the patient been seen at the hospital within the last three years: Yes Total Score: 80 Level Of Care: New/Established - Level 3 Electronic Signature(s) Signed: 06/22/2020 5:36:57 PM By: Levan Hurst RN, BSN Entered By: Levan Hurst on 06/22/2020 16:07:12 -------------------------------------------------------------------------------- Lower Extremity Assessment Details Patient Name: Date of Service: Aaron Salon D. 06/22/2020 2:45 PM Medical Record Number: [id number] Patient Account Number: [id number] Date of Birth/Sex: Treating RN: September 22, 1933 (86 y.o. Marvis Repress Primary Care Illeana Edick: [name] Other Clinician: Referring Conroy Goracke: Treating Daril Warga/Extender: [name] in  Treatment: 0 Edema Assessment Assessed: Shirlyn Goltz: No] Patrice Paradise: No] Edema: [Left: Yes] [Right: Yes] Calf Left: Right: Point of Measurement: 33 cm From Medial Instep 38 cm 35 cm Ankle Left: Right: Point of Measurement: 13 cm From Medial Instep 23 cm 24 cm Vascular Assessment Pulses: Dorsalis Pedis Palpable: [Left:No] [Right:No] Electronic Signature(s) Signed: 06/22/2020 5:22:44 PM By: Kela Millin Entered By: Kela Millin on 06/22/2020 15:01:18 -------------------------------------------------------------------------------- Pain Assessment Details Patient Name: Date of Service: Aaron Salon D. 06/22/2020 2:45 PM Medical Record Number: [id number] Patient Account Number: [id number] Date of Birth/Sex: Treating RN: 1933/10/22 (86 y.o. Marvis Repress Primary Care Royalty Domagala: [name] Other Clinician: Referring Levie Owensby: Treating Bland Rudzinski/Extender: [name] in Treatment: 0 Active Problems Location of Pain Severity and Description of Pain Patient Has Paino No Site Locations Pain Management and Medication Current Pain Management: Electronic Signature(s) Signed: 06/22/2020 5:22:44 PM By: Kela Millin Entered By: Kela Millin on 06/22/2020 15:01:27 -------------------------------------------------------------------------------- Patient/Caregiver Education Details Patient Name: Date of Service: [name] 8/19/2021andnbsp2:45 PM Medical Record Number: [id number] Patient Account Number: [id number] Date of Birth/Gender: Treating RN: 12-10-1932 (86 y.o. Janyth Contes Primary Care Physician: [name] Other Clinician: Referring Physician: Treating Physician/Extender: [name] in Treatment: 0 Education Assessment Education Provided To: Patient Education Topics Provided Venous: Methods: Explain/Verbal Responses: State content correctly Wound/Skin Impairment: Methods:  Explain/Verbal Responses: State content correctly Electronic Signature(s) Signed: 06/22/2020 5:36:57 PM By: Levan Hurst RN, BSN Entered By: Levan Hurst on 06/22/2020 16:05:56 -------------------------------------------------------------------------------- [place] Details Patient Name: Date of Service: Aaron Salon D. 06/22/2020 2:45 PM Medical Record Number: [id number] Patient Account Number: [id number] Date of Birth/Sex: Treating RN: [birth year] (86 y.o. Marvis Repress Primary Care Minda Faas: [name] Other Clinician: Referring Carrell Palmatier: Treating Winnona Wargo/Extender: [name] in Treatment: 0 Vital Signs Time Taken: 14:40 Temperature (F): 97.8 Height (in): 68 Pulse (bpm): 68 Source: Stated Respiratory Rate (breaths/min): 20 Weight (lbs): 187 Blood Pressure (mmHg): 160/66 Source: Stated Capillary Blood Glucose (mg/dl): 174 Body Mass Index (BMI): 28.4 Reference Range:  80 - 120 mg / dl Notes patient stated last CBG was 174 Electronic Signature(s) Signed: 06/22/2020 5:22:44 PM By: Kela Millin Entered By: Kela Millin on 06/22/2020 14:48:37

## 2020-06-22 NOTE — Progress Notes (Signed)
[name] ([id number]) Visit Report for 06/22/2020 Abuse/Suicide Risk Screen Details Patient Name: Date of Service: [name], [name] 06/22/2020 2:45 PM Medical Record Number: [id number] Patient Account Number: [id number] Date of Birth/Sex: Treating RN: [birth year] (86 y.o. [name] Primary Care [name]: [name] Other Clinician: Referring [name]: Treating Jaelee Laughter/Extender: [name] in Treatment: 0 Abuse/Suicide Risk Screen Items Answer ABUSE RISK SCREEN: Has anyone close to you tried to hurt or harm you recentlyo No Do you feel uncomfortable with anyone in your familyo No Has anyone forced you do things that you didnt want to doo No Electronic Signature(s) Signed: 06/22/2020 5:22:44 PM By: [name] Entered By: [name] on 06/22/2020 14:59:05 -------------------------------------------------------------------------------- Activities of Daily Living Details Patient Name: Date of Service: [name] 06/22/2020 2:45 PM Medical Record Number: [id number] Patient Account Number: [id number] Date of Birth/Sex: Treating RN: 21-Mar-1933 (86 y.o. [name] Primary Care [name]: [name] Other Clinician: Referring [name]: Treating [name]/Extender: [name] in Treatment: 0 Activities of Daily Living Items Answer Activities of Daily Living (Please select one for each item) Drive Automobile Completely Able T Medications ake Completely Able Use T elephone Completely Able Care for Appearance Completely Able Use T oilet Completely Able Bath / Shower Completely Able Dress Self Completely Able Feed Self Completely Able Walk Completely Able Get In / Out Bed Completely Able Housework Completely Able Prepare Meals Completely [place] Completely Able Shop for Self Completely Able Electronic Signature(s) Signed: 06/22/2020 5:22:44 PM By: [name] Entered By: [name] on 06/22/2020 14:59:11 -------------------------------------------------------------------------------- Education Screening Details Patient Name: Date of Service: Aaron Salon D. 06/22/2020 2:45 PM Medical Record Number: [id number] Patient Account Number: [id number] Date of Birth/Sex: Treating RN: 1933/10/17 (86 y.o. [name] Primary Care [name]: [name] Other Clinician: Referring [name]: Treating [name]/Extender: [name] in Treatment: 0 Primary Learner Assessed: Patient Learning Preferences/Education Level/Primary Language Learning Preference: Explanation Preferred Language: English Cognitive Barrier Language Barrier: No Translator Needed: No Memory Deficit: No Emotional Barrier: No Cultural/Religious Beliefs Affecting Medical Care: No Physical Barrier Impaired Vision: Yes Glasses Impaired Hearing: No Decreased Hand dexterity: No Knowledge/Comprehension Knowledge Level: High Comprehension Level: High Ability to understand written instructions: High Ability to understand verbal instructions: High Motivation Anxiety Level: Calm Cooperation: Cooperative Education Importance: Acknowledges Need Interest in Health Problems: Asks Questions Perception: Coherent Willingness to Engage in Self-Management High Activities: Readiness to Engage in Self-Management High Activities: Electronic Signature(s) Signed: 06/22/2020 5:22:44 PM By: [name] Entered By: [name] on 06/22/2020 14:59:16 -------------------------------------------------------------------------------- Fall Risk Assessment Details Patient Name: Date of Service: Aaron Salon D. 06/22/2020 2:45 PM Medical Record Number: [id number] Patient Account Number: [id number] Date of Birth/Sex: Treating RN: [birth year] (86 y.o. [name] Primary Care [name]: [name] Other Clinician: Referring  [name]: Treating [name]/Extender: [name] in Treatment: 0 Fall Risk Assessment Items Have you had 2 or more falls in the last 12 monthso 0 No Have you had any fall that resulted in injury in the last 12 monthso 0 No FALLS RISK SCREEN History of falling - immediate or within 3 months 0 No Secondary diagnosis (Do you have 2 or more medical diagnoseso) 0 No Ambulatory aid None/bed rest/wheelchair/nurse 0 Yes Crutches/cane/walker 15 Yes Furniture 0 No Intravenous therapy Access/Saline/Heparin Lock 0 No Gait/Transferring Normal/ bed rest/ wheelchair 0 Yes Weak (short steps with or without shuffle, stooped but able to lift head while walking, may seek  0 No support from furniture) Impaired (short steps with shuffle, may have difficulty arising from chair, head down, impaired 0 No balance) Mental Status Oriented to own ability 0 Yes Electronic Signature(s) Signed: 06/22/2020 5:22:44 PM By: [name] Entered By: [name] on 06/22/2020 14:59:31 -------------------------------------------------------------------------------- Foot Assessment Details Patient Name: Date of Service: Aaron Salon D. 06/22/2020 2:45 PM Medical Record Number: [id number] Patient Account Number: [id number] Date of Birth/Sex: Treating RN: 11-27-1932 (86 y.o. [name] Primary Care Anacaren Kohan: [name] Other Clinician: Referring [name]: Treating [name]/Extender: [name] in Treatment: 0 Foot Assessment Items Site Locations + = Sensation present, - = Sensation absent, C = Callus, U = Ulcer R = Redness, W = Warmth, M = Maceration, PU = Pre-ulcerative lesion F = Fissure, S = Swelling, D = Dryness Assessment Right: Left: Other Deformity: No No Prior Foot Ulcer: No No Prior Amputation: No No Charcot Joint: No No Ambulatory Status: Ambulatory With Help Gait: Steady Electronic Signature(s) Signed: 06/22/2020 5:22:44  PM By: [name] Entered By: [name] on 06/22/2020 14:58:01 -------------------------------------------------------------------------------- Nutrition Risk Screening Details Patient Name: Date of Service: [name] 06/22/2020 2:45 PM Medical Record Number: [id number] Patient Account Number: [id number] Date of Birth/Sex: Treating RN: Jan 13, 1933 (86 y.o. [name] Primary Care [name]: [name] Other Clinician: Referring Verl Whitmore: Treating Karee Christopherson/Extender: [name] in Treatment: 0 Height (in): 68 Weight (lbs): 187 Body Mass Index (BMI): 28.4 Nutrition Risk Screening Items Score Screening NUTRITION RISK SCREEN: I have an illness or condition that made me change the kind and/or amount of food I eat 0 No I eat fewer than two meals per day 0 No I eat few fruits and vegetables, or milk products 0 No I have three or more drinks of beer, liquor or wine almost every day 0 No I have tooth or mouth problems that make it hard for me to eat 0 No I don't always have enough money to buy the food I need 0 No I eat alone most of the time 0 No I take three or more different prescribed or over-the-counter drugs a day 1 Yes Without wanting to, I have lost or gained 10 pounds in the last six months 0 No I am not always physically able to shop, cook and/or feed myself 0 No Nutrition Protocols Good Risk Protocol 0 No interventions needed Moderate Risk Protocol High Risk Proctocol Risk Level: Good Risk Score: 1 Electronic Signature(s) Signed: 06/22/2020 5:22:44 PM By: [name] Entered By: [name] on 06/22/2020 14:57:35

## 2020-06-28 ENCOUNTER — Ambulatory Visit: Payer: Medicare Other | Admitting: Specialist

## 2020-07-06 ENCOUNTER — Other Ambulatory Visit: Payer: Self-pay | Admitting: Gastroenterology

## 2020-07-06 DIAGNOSIS — R1013 Epigastric pain: Secondary | ICD-10-CM

## 2020-07-14 ENCOUNTER — Other Ambulatory Visit: Payer: Self-pay | Admitting: Gastroenterology

## 2020-07-14 ENCOUNTER — Ambulatory Visit
Admission: RE | Admit: 2020-07-14 | Discharge: 2020-07-14 | Disposition: A | Discharge status: Home / Self Care | Payer: Medicare Other | Source: Ambulatory Visit | Attending: Gastroenterology | Admitting: Gastroenterology

## 2020-07-14 ENCOUNTER — Other Ambulatory Visit: Payer: Medicare Other

## 2020-07-14 DIAGNOSIS — R1013 Epigastric pain: Secondary | ICD-10-CM

## 2020-07-17 ENCOUNTER — Telehealth: Payer: Self-pay | Admitting: *Deleted

## 2020-07-17 NOTE — Telephone Encounter (Signed)
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    Procedure: endoscopy Date of procedure: 08/02/20  CHADS2-VASc score of  5 (CHF, HTN, AGE x 2, DM2)  CrCl 52 Platelet count 161 (NOTE: last platelet count in chart from 2019)  Per office protocol, patient can hold warfarin for 5 days prior to procedure.   Patient will need bridging with Lovenox (enoxaparin) around procedure.  Patient does not have INR followed by [facility].  Will need to reach out to be sure provider can do bridging.

## 2020-07-17 NOTE — Telephone Encounter (Signed)
Clinical pharmacist to review coumadin 

## 2020-07-17 NOTE — Telephone Encounter (Signed)
° °  [facility] Pre-operative Risk Assessment    HEARTCARE STAFF: - Please ensure there is not already an duplicate clearance open for this procedure. - Under Visit Info/Reason for Call, type in Other and utilize the format Clearance MM/DD/YY or Clearance TBD. Do not use dashes or single digits. - If request is for dental extraction, please clarify the # of teeth to be extracted.  Request for surgical clearance:  1. What type of surgery is being performed?  ENDOSCOPY   2. When is this surgery scheduled?  08/02/20   3. What type of clearance is required (medical clearance vs. Pharmacy clearance to hold med vs. Both)?  BOTH  4. Are there any medications that need to be held prior to surgery and how long? COUMADIN X'S 5 DAYS   5. Practice name and name of physician performing surgery?  [facility] / DR. KARKI   6. What is the office phone number?  [phone]   7.   What is the office fax number?  [phone]  8.   Anesthesia type (None, local, MAC, general) ?  PROPOFOL    Jeanann Lewandowsky 07/17/2020, 3:24 PM  _________________________________________________________________   (provider comments below)

## 2020-07-17 NOTE — Telephone Encounter (Signed)
   Primary Cardiologist: Dr. Lovena Le  Chart reviewed as part of pre-operative protocol coverage. Because of [name]'s past medical history and time since last visit, they will require a follow-up visit in order to better assess preoperative cardiovascular risk.  Pre-op covering staff: - Please schedule appointment and call patient to inform them. If patient already had an upcoming appointment within acceptable timeframe, please add "pre-op clearance" to the appointment notes so provider is aware. - Please contact requesting surgeon's office via preferred method (i.e, phone, fax) to inform them of need for appointment prior to surgery.  If applicable, this message will also be routed to pharmacy pool and/or primary cardiologist for input on holding anticoagulant/antiplatelet agent as requested below so that this information is available to the clearing provider at time of patient's appointment.   [place], [place]  07/17/2020, 4:26 PM

## 2020-07-18 NOTE — Telephone Encounter (Signed)
Regarding anticoagulation review below.  Patient had TAVR in 2014.  Patient does NOT need Lovenox bridge around upcoming endoscopy procedure.   Will need to hold warfarin x 5 days prior and restart evening of or day after at discretion of physician.

## 2020-07-18 NOTE — Progress Notes (Signed)
Electrophysiology Office Note Date: 07/19/2020  ID:  [name], DOB [birth year], MRN [id number]  PCP: [name], MD Electrophysiologist: Lovena Le  CC: surgical clearance  [name] is a 86 y.o. male seen today for Dr Lovena Le.  He presents today for routine electrophysiology followup and surgical clearance for EGD. He has been having issues with reflux and swallowing.  Since last being seen in our clinic, the patient reports doing very well.  He denies chest pain, palpitations, dyspnea, PND, orthopnea, nausea, vomiting, dizziness, syncope, edema, weight gain, or early satiety.  He remains active without cardiac symptoms. He is fully vaccinated.   Past Medical History:  Diagnosis Date  . Anemia    LOW PLATELETS OTHER DAY  PER PT  . Anticoagulated on Coumadin 12/21/2012  . Aortic stenosis   . Arthritis    "left wrist; back sometimes" (01/21/2013)  . Atrial fibrillation ([place])    [name], DR COOPER  . Bradycardia   . Exertional shortness of breath   . Heart murmur    "I've had it for years; runs in the family on daddy's side" (01/21/2013)  . HTN (hypertension)   . Hyperlipemia 12/21/2012  . Prostate cancer [name])    "had 40 tx of radiation in 2009" (01/21/2013)  . S/P aortic valve replacement with bioprosthetic valve 01/19/2013   Transcatheter Aortic Valve Replacement using 52mm Sapien bioprosthetic tissue valve via transapical approach  . Type II diabetes mellitus ([place])    Past Surgical History:  Procedure Laterality Date  . CARDIAC CATHETERIZATION  12/16/2012  . CARDIAC VALVE REPLACEMENT  01/19/2013   AVR  . CATARACT EXTRACTION W/ INTRAOCULAR LENS  IMPLANT, BILATERAL    . EYE SURGERY    . INTRAOPERATIVE TRANSESOPHAGEAL ECHOCARDIOGRAM N/A 01/19/2013   Procedure: INTRAOPERATIVE TRANSESOPHAGEAL ECHOCARDIOGRAM;  Surgeon: [name], MD;  Location: [place] [facility];  Service: Open Heart Surgery;  Laterality: N/A;  . ORIF FOREARM FRACTURE Left 1954   "compound fx"  (01/21/2013)  . TRANSTHORACIC ECHOCARDIOGRAM  09/04/10, 09/07/08    Current Outpatient Medications  Medication Sig Dispense Refill  . ACCU-CHEK AVIVA PLUS test strip USE TO CHECK BLOOD SUGARS 1 TO 2 TIMES A DAY  3  . ACCU-CHEK SOFTCLIX LANCETS lancets daily. use as directed  3  . amLODipine (NORVASC) 5 MG tablet Take 5 mg by mouth daily.     . B Complex Vitamins (B COMPLEX 50) TABS Take 1 tablet by mouth daily.     . carvedilol (COREG) 12.5 MG tablet Take 1 tablet by mouth twice daily 180 tablet 3  . cholecalciferol (VITAMIN D) 1000 UNITS tablet Take 1,000 Units by mouth 2 (two) times daily.    Marland Kitchen doxazosin (CARDURA) 8 MG tablet Take 8 mg by mouth daily.     . furosemide (LASIX) 40 MG tablet Take 1 tablet by mouth once daily 90 tablet 2  . glipiZIDE (GLUCOTROL) 5 MG tablet Take 5 mg by mouth 2 (two) times daily before a meal.     . ketoconazole (NIZORAL) 2 % cream APPLY TO BACK SIDE TWICE DAILY AS NEEDED  3  . lisinopril (ZESTRIL) 20 MG tablet Take 1 tablet (20 mg total) by mouth daily. Must schedule overdue follow up appt  for further refills 90 tablet 0  . metFORMIN (GLUCOPHAGE) 1000 MG tablet Take 1,000 mg by mouth 2 (two) times daily with a meal.     . NONFORMULARY OR COMPOUNDED ITEM Antifungal solution: Terbinafine 3%, Fluconazole 2%, Tea Tree Oil 5%, Urea  10%, Ibuprofen 2% in DMSO suspension #102mL 1 each 3  . NONFORMULARY OR COMPOUNDED ITEM Duke's Magic Mouthwash 136ml - Hydrocortisone 60mg , Nystatin suspension 63ml, Diphenhydramine 12.5mg /64ml QS 234ml. Swish 1 teaspoon and spit 3 times a day. 120 each 0  . nystatin (MYCOSTATIN) 100000 UNIT/ML suspension Take by mouth.    . Potassium Gluconate 595 MG CAPS Take 595 mg by mouth 2 (two) times daily.    . rosuvastatin (CRESTOR) 5 MG tablet Take 1 tablet (5 mg total) by mouth daily at 6 PM. 90 tablet 2  . Selenium 200 MCG CAPS Take 200 mcg by mouth every morning.     . vitamin A 8000 UNIT capsule Take 8,000 Units by mouth every morning.    .  vitamin B-12 (CYANOCOBALAMIN) 1000 MCG tablet Take 1,000 mcg by mouth 2 (two) times daily.    . vitamin C (ASCORBIC ACID) 500 MG tablet Take 500 mg by mouth 2 (two) times daily.     Marland Kitchen warfarin (COUMADIN) 5 MG tablet     . zinc gluconate 50 MG tablet Take 50 mg by mouth every morning.      No current facility-administered medications for this visit.   Facility-Administered Medications Ordered in Other Visits  Medication Dose Route Frequency Provider Last Rate Last Admin  . norepinephrine (LEVOPHED) 8 mg in dextrose 5 % 250 mL infusion  2-50 mcg/min Intravenous Titrated [name], MD        Allergies:   Amoxil [amoxicillin]   Social History: Social History   Socioeconomic History  . Marital status: Married    Spouse name: Not on file  . Number of children: Not on file  . Years of education: Not on file  . Highest education level: Not on file  Occupational History  . Not on file  Tobacco Use  . Smoking status: Former Smoker    Packs/day: 2.00    Years: 22.00    Pack years: 44.00    Types: Cigarettes    Quit date: 09/11/1976    Years since quitting: 43.8  . Smokeless tobacco: Never Used  Substance and Sexual Activity  . Alcohol use: No  . Drug use: No  . Sexual activity: Never  Other Topics Concern  . Not on file  Social History Narrative  . Not on file   Social Determinants of Health   Financial Resource Strain:   . Difficulty of Paying Living Expenses: Not on file  Food Insecurity:   . Worried About Charity fundraiser in the Last Year: Not on file  . Ran Out of Food in the Last Year: Not on file  Transportation Needs:   . Lack of Transportation (Medical): Not on file  . Lack of Transportation (Non-Medical): Not on file  Physical Activity:   . Days of Exercise per Week: Not on file  . Minutes of Exercise per Session: Not on file  Stress:   . Feeling of Stress : Not on file  Social Connections:   . Frequency of Communication with Friends and Family: Not  on file  . Frequency of Social Gatherings with Friends and Family: Not on file  . Attends Religious Services: Not on file  . Active Member of Clubs or Organizations: Not on file  . Attends Archivist Meetings: Not on file  . Marital Status: Not on file  Intimate Partner Violence:   . Fear of Current or Ex-Partner: Not on file  . Emotionally Abused: Not on file  . Physically  Abused: Not on file  . Sexually Abused: Not on file    Family History: Family History  Problem Relation Age of Onset  . Diabetes Mother   . Melanoma Mother 36  . Bone cancer Mother 56  . Pancreatic cancer Father 44  . Heart disease Father   . Stroke Sister   . Cancer Brother 20       ? brain cancer    Review of Systems: All other systems reviewed and are otherwise negative except as noted above.   Physical Exam: VS:  BP 130/60   Pulse 79   Ht 5' 8.5" (1.74 m)   Wt 175 lb (79.4 kg)   SpO2 91%   BMI 26.22 kg/m  , BMI Body mass index is 26.22 kg/m. Wt Readings from Last 3 Encounters:  07/19/20 175 lb (79.4 kg)  12/30/19 186 lb (84.4 kg)  09/29/19 186 lb (84.4 kg)    GEN- The patient is elderly appearing, alert and oriented x 3 today.   HEENT: normocephalic, atraumatic; sclera clear, conjunctiva pink; hearing intact; oropharynx clear; neck supple  Lungs- Clear to ausculation bilaterally, normal work of breathing.  No wheezes, rales, rhonchi Heart- Irregular rate and rhythm  GI- soft, non-tender, non-distended, bowel sounds present, no hepatosplenomegaly Extremities- no clubbing, cyanosis, or edema  MS- no significant deformity or atrophy Skin- warm and dry, no rash or lesion  Psych- euthymic mood, full affect Neuro- strength and sensation are intact   EKG:  EKG is ordered today. The ekg ordered today shows atrial fibrillation, rate 79  Recent Labs: No results found for requested labs within last 8760 hours.    Other studies Reviewed: Additional studies/ records that were  reviewed today include: Dr Tanna Furry office notes  Assessment and Plan:  1.  Persistent atrial fibrillation Asymptomatic Rate controlled Continue Warfarin for CHADS2VASC of 4 Per pharmacy protocol, no need for Lovenox bridge for EGD, ok to hold Warfarin for 5 days prior to procedure  2.  HTN Stable No change required today  3.  AS s/p TAVR  Doing well post TAVR 2014 Will update echo   4.  Surgical clearance Pt is at acceptable cardiac risk for low risk procedure No need for further cardiac work up prior to EGD   Current medicines are reviewed at length with the patient today.   The patient does not have concerns regarding his medicines.  The following changes were made today:  none  Labs/ tests ordered today include: echo No orders of the defined types were placed in this encounter.    Disposition:   Follow up with Dr Lovena Le in 1 year     Signed, Chanetta Marshall, NP 07/19/2020 8:31 AM   [address] [place] [address] [phone] (office) [phone] (fax)

## 2020-07-18 NOTE — Telephone Encounter (Signed)
Patient has an appointment  on 07/18/20 with Annett Fabian ,NP to discuss  pre op clearance for endoscopy on 08/02/20 Will remove from pre op pool

## 2020-07-19 ENCOUNTER — Other Ambulatory Visit: Payer: Self-pay

## 2020-07-19 ENCOUNTER — Ambulatory Visit: Payer: Medicare Other | Admitting: Nurse Practitioner

## 2020-07-19 ENCOUNTER — Other Ambulatory Visit: Payer: Self-pay | Admitting: Internal Medicine

## 2020-07-19 ENCOUNTER — Encounter: Payer: Self-pay | Admitting: Nurse Practitioner

## 2020-07-19 VITALS — BP 130/60 | HR 79 | Ht 68.5 in | Wt 175.0 lb

## 2020-07-19 DIAGNOSIS — I350 Nonrheumatic aortic (valve) stenosis: Secondary | ICD-10-CM | POA: Diagnosis not present

## 2020-07-19 DIAGNOSIS — Z01818 Encounter for other preprocedural examination: Secondary | ICD-10-CM | POA: Diagnosis not present

## 2020-07-19 DIAGNOSIS — I10 Essential (primary) hypertension: Secondary | ICD-10-CM

## 2020-07-19 DIAGNOSIS — I4819 Other persistent atrial fibrillation: Secondary | ICD-10-CM | POA: Diagnosis not present

## 2020-07-19 NOTE — Patient Instructions (Signed)
Medication Instructions:  No changes *If you need a refill on your cardiac medications before your next appointment, please call your pharmacy*   Lab Work: none If you have labs (blood work) drawn today and your tests are completely normal, you will receive your results only by: Marland Kitchen MyChart Message (if you have MyChart) OR . A paper copy in the mail If you have any lab test that is abnormal or we need to change your treatment, we will call you to review the results.   Testing/Procedures: Your physician has requested that you have an echocardiogram. Echocardiography is a painless test that uses sound waves to create images of your heart. It provides your doctor with information about the size and shape of your heart and how well your heart's chambers and valves are working. This procedure takes approximately one hour. There are no restrictions for this procedure.   Follow-Up: At [facility], you and your health needs are our priority.  As part of our continuing mission to provide you with exceptional heart care, we have created designated Provider Care Teams.  These Care Teams include your primary Cardiologist (physician) and Advanced Practice Providers (APPs -  Physician Assistants and Nurse Practitioners) who all work together to provide you with the care you need, when you need it.  We recommend signing up for the patient portal called "MyChart".  Sign up information is provided on this After Visit Summary.  MyChart is used to connect with patients for Virtual Visits (Telemedicine).  Patients are able to view lab/test results, encounter notes, upcoming appointments, etc.  Non-urgent messages can be sent to your provider as well.   To learn more about what you can do with MyChart, go to [email or web address].    Your next appointment:   12 month(s)  The format for your next appointment:   In Person  Provider:   You may see Cristopher Peru, MD or one of the following Advanced Practice  Providers on your designated Care Team:    Chanetta Marshall, NP  Tommye Standard, PA-C  Legrand Como "Oda Kilts, [place]    Other Instructions

## 2020-07-24 ENCOUNTER — Encounter: Payer: Self-pay | Admitting: Podiatry

## 2020-07-24 ENCOUNTER — Ambulatory Visit: Payer: Medicare Other | Admitting: Podiatry

## 2020-07-24 ENCOUNTER — Other Ambulatory Visit: Payer: Self-pay

## 2020-07-24 DIAGNOSIS — B351 Tinea unguium: Secondary | ICD-10-CM | POA: Diagnosis not present

## 2020-07-24 DIAGNOSIS — M79675 Pain in left toe(s): Secondary | ICD-10-CM | POA: Diagnosis not present

## 2020-07-24 DIAGNOSIS — L600 Ingrowing nail: Secondary | ICD-10-CM

## 2020-07-24 DIAGNOSIS — M79674 Pain in right toe(s): Secondary | ICD-10-CM | POA: Diagnosis not present

## 2020-07-24 DIAGNOSIS — E1142 Type 2 diabetes mellitus with diabetic polyneuropathy: Secondary | ICD-10-CM

## 2020-07-24 NOTE — Progress Notes (Signed)
Subjective:  Patient ID: [name], male    DOB: Mar 28, 1933,  MRN: [id number]  86 y.o. male presents with preventative diabetic foot care and painful thick toenails that are difficult to trim. Pain interferes with ambulation. Aggravating factors include wearing enclosed shoe gear. Pain is relieved with periodic professional debridement.   He states he will be having a procedure on his esophagus as he has problems with foot getting stuck when eating.  He states he may have stubbed his left 2nd digit some time ago. Relates no pain, no redness, swelling or pus from area.  Review of Systems: Negative except as noted in the HPI.  Past Medical History:  Diagnosis Date  . Anemia    LOW PLATELETS OTHER DAY  PER PT  . Anticoagulated on Coumadin 12/21/2012  . Aortic stenosis   . Arthritis    "left wrist; back sometimes" (01/21/2013)  . Atrial fibrillation ([place])    [name], DR COOPER  . Bradycardia   . Exertional shortness of breath   . Heart murmur    "I've had it for years; runs in the family on daddy's side" (01/21/2013)  . HTN (hypertension)   . Hyperlipemia 12/21/2012  . Prostate cancer [facility])    "had 40 tx of radiation in 2009" (01/21/2013)  . S/P aortic valve replacement with bioprosthetic valve 01/19/2013   Transcatheter Aortic Valve Replacement using 1mm Sapien bioprosthetic tissue valve via transapical approach  . Type II diabetes mellitus ([place])    Past Surgical History:  Procedure Laterality Date  . CARDIAC CATHETERIZATION  12/16/2012  . CARDIAC VALVE REPLACEMENT  01/19/2013   AVR  . CATARACT EXTRACTION W/ INTRAOCULAR LENS  IMPLANT, BILATERAL    . EYE SURGERY    . INTRAOPERATIVE TRANSESOPHAGEAL ECHOCARDIOGRAM N/A 01/19/2013   Procedure: INTRAOPERATIVE TRANSESOPHAGEAL ECHOCARDIOGRAM;  Surgeon: [name], MD;  Location: [place];  Service: Open Heart Surgery;  Laterality: N/A;  . ORIF FOREARM FRACTURE Left 1954   "compound fx" (01/21/2013)  . TRANSTHORACIC ECHOCARDIOGRAM   09/04/10, 09/07/08   Patient Active Problem List   Diagnosis Date Noted  . Claudication in peripheral vascular disease ([place]) 08/22/2017  . Absolute anemia 04/02/2016  . HLD (hyperlipidemia) 04/02/2016  . BP (high blood pressure) 04/02/2016  . Long term current use of anticoagulant 04/02/2016  . Macrocytosis 04/02/2016  . Arthritis, degenerative 04/02/2016  . History of colon polyps 04/02/2016  . Carcinoma of prostate ([place]) 04/02/2016  . Abdominal discomfort, epigastric 12/20/2015  . H/O adenomatous polyp of colon 12/20/2015  . Left upper quadrant pain 03/16/2015  . Chronic diastolic heart failure ([place]) 08/30/2014  . 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 S/P aortic valve replacement with bioprosthetic valve 01/19/2013  . Diabetes mellitus ([place]) 12/21/2012  . Hyperlipemia 12/21/2012  . H/O prostate cancer 12/21/2012  . Anticoagulated on Coumadin 12/21/2012  . HYPERTENSION, CONTROLLED 09/06/2009  . AORTIC STENOSIS 09/06/2009  . ATRIAL FIBRILLATION, CHRONIC 09/06/2009    Current Outpatient Medications:  .  ACCU-CHEK AVIVA PLUS test strip, USE TO CHECK BLOOD SUGARS 1 TO 2 TIMES A DAY, Disp: , Rfl: 3 .  ACCU-CHEK SOFTCLIX LANCETS lancets, daily. use as directed, Disp: , Rfl: 3 .  amLODipine (NORVASC) 5 MG tablet, Take 5 mg by mouth daily. , Disp: , Rfl:  .  B Complex Vitamins (B COMPLEX 50) TABS, Take 1 tablet by mouth daily. , Disp: , Rfl:  .  carvedilol (COREG) 12.5 MG tablet, Take 1 tablet by mouth twice daily, Disp: 180  tablet, Rfl: 3 .  cholecalciferol (VITAMIN D) 1000 UNITS tablet, Take 1,000 Units by mouth 2 (two) times daily., Disp: , Rfl:  .  doxazosin (CARDURA) 8 MG tablet, Take 8 mg by mouth daily. , Disp: , Rfl:  .  furosemide (LASIX) 40 MG tablet, Take 1 tablet by mouth once daily, Disp: 90 tablet, Rfl: 2 .  glipiZIDE (GLUCOTROL) 5 MG tablet, Take 5 mg by mouth 2 (two) times daily before a meal. , Disp: , Rfl:  .  ketoconazole (NIZORAL) 2 %  cream, APPLY TO BACK SIDE TWICE DAILY AS NEEDED, Disp: , Rfl: 3 .  lisinopril (ZESTRIL) 20 MG tablet, Take 1 tablet (20 mg total) by mouth daily. Must schedule overdue follow up appt  for further refills, Disp: 90 tablet, Rfl: 0 .  metFORMIN (GLUCOPHAGE) 1000 MG tablet, Take 1,000 mg by mouth 2 (two) times daily with a meal. , Disp: , Rfl:  .  NONFORMULARY OR COMPOUNDED ITEM, Antifungal solution: Terbinafine 3%, Fluconazole 2%, Tea Tree Oil 5%, Urea 10%, Ibuprofen 2% in DMSO suspension #30mL, Disp: 1 each, Rfl: 3 .  NONFORMULARY OR COMPOUNDED ITEM, Duke's Magic Mouthwash 175ml - Hydrocortisone 60mg , Nystatin suspension 33ml, Diphenhydramine 12.5mg /58ml QS 262ml. Swish 1 teaspoon and spit 3 times a day., Disp: 120 each, Rfl: 0 .  nystatin (MYCOSTATIN) 100000 UNIT/ML suspension, Take by mouth., Disp: , Rfl:  .  Potassium Gluconate 595 MG CAPS, Take 595 mg by mouth 2 (two) times daily., Disp: , Rfl:  .  rosuvastatin (CRESTOR) 5 MG tablet, TAKE 1 TABLET BY MOUTH ONCE DAILY 6 IN THE EVENING, Disp: 90 tablet, Rfl: 3 .  Selenium 200 MCG CAPS, Take 200 mcg by mouth every morning. , Disp: , Rfl:  .  vitamin A 8000 UNIT capsule, Take 8,000 Units by mouth every morning., Disp: , Rfl:  .  vitamin B-12 (CYANOCOBALAMIN) 1000 MCG tablet, Take 1,000 mcg by mouth 2 (two) times daily., Disp: , Rfl:  .  vitamin C (ASCORBIC ACID) 500 MG tablet, Take 500 mg by mouth 2 (two) times daily. , Disp: , Rfl:  .  warfarin (COUMADIN) 5 MG tablet, , Disp: , Rfl:  .  zinc gluconate 50 MG tablet, Take 50 mg by mouth every morning. , Disp: , Rfl:  No current facility-administered medications for this visit.  Facility-Administered Medications Ordered in Other Visits:  .  norepinephrine (LEVOPHED) 8 mg in dextrose 5 % 250 mL infusion, 2-50 mcg/min, Intravenous, Titrated, [name], MD Allergies  Allergen Reactions  . Amoxil [Amoxicillin]     Burning of mouth, tongue and lips.   Social History   Tobacco Use  Smoking  Status Former Smoker  . Packs/day: 2.00  . Years: 22.00  . Pack years: 44.00  . Types: Cigarettes  . Quit date: 09/11/1976  . Years since quitting: 43.8  Smokeless Tobacco Never Used   Objective:  There were no vitals filed for this visit. Constitutional Patient is a pleasant 86 y.o. Caucasian male in NAD.Marland Kitchen AAO x 3.  Vascular Neurovascular status unchanged b/l lower extremities. Palpable DP pulse(s) b/l lower extremities Palpable PT pulse(s) b/l lower extremities Pedal hair absent. Lower extremity skin temperature gradient within normal limits. No pain with calf compression b/l. No edema noted b/l lower extremities. No cyanosis or clubbing noted.  Neurologic Normal speech. Oriented to person, place, and time. Protective sensation intact 5/5 intact bilaterally with 10g monofilament b/l. Vibratory sensation intact b/l.  Dermatologic Pedal skin with normal turgor, texture and tone  bilaterally. No open wounds bilaterally. No interdigital macerations bilaterally. Toenails 1-5 b/l elongated, discolored, dystrophic, thickened, crumbly with subungual debris and tenderness to dorsal palpation. Incurvated nailplate medial and lateral border(s) L hallux.  Nail border hypertrophy mild. There is tenderness to palpation. Sign(s) of infection: no clinical signs of infection noted on examination today.. Procedure site of R hallux noted to be completely healed with no erythema, no edema, no drainage, no purulence.   Subungual hematoma noted of the left 2nd digit. Appears to be subacute with dark heme under the toenail. Nailplate remains adered. No erythema, no edema, no active drainage, no flocculence, no fluid expressed when pressure applied to digit.  Orthopedic: Normal muscle strength 5/5 to all lower extremity muscle groups bilaterally. No pain crepitus or joint limitation noted with ROM b/l. No gross bony deformities bilaterally. Patient ambulates independent of any assistive aids.    Assessment:   1.  Pain due to onychomycosis of toenails of both feet   2. Ingrown toenail without infection   3. Diabetic peripheral neuropathy associated with type 2 diabetes mellitus ([place])    Plan:  Patient was evaluated and treated and all questions answered.  Onychomycosis with pain -Nails palliatively debridement as below. -Educated on self-care  Procedure: Nail Debridement Rationale: Pain Type of Debridement: manual, sharp debridement. Instrumentation: Nail nipper, rotary burr. Number of Nails: 10  -Examined patient. -Continue diabetic foot care principles. -Toenails 1-5 b/l were debrided in length and girth with sterile nail nippers and dremel without iatrogenic bleeding.  -Offending nail border debrided and curretaged L hallux utilizing sterile nail nipper and currette. Border(s) cleansed with alcohol and triple antibiotic ointment applied. Patient instructed to apply Neosporin to L hallux once daily for 7 days. -Patient to report any pedal injuries to medical professional immediately. -Patient to continue soft, supportive shoe gear daily. -Patient/POA to call should there be question/concern in the interim.  Return in about 9 weeks (around 09/25/2020).  Marzetta Board, DPM

## 2020-07-29 ENCOUNTER — Other Ambulatory Visit (HOSPITAL_COMMUNITY)
Admission: RE | Admit: 2020-07-29 | Discharge: 2020-07-29 | Disposition: A | Discharge status: Home / Self Care | Payer: Medicare Other | Source: Ambulatory Visit | Attending: Gastroenterology | Admitting: Gastroenterology

## 2020-07-29 DIAGNOSIS — Z01812 Encounter for preprocedural laboratory examination: Secondary | ICD-10-CM | POA: Insufficient documentation

## 2020-07-29 DIAGNOSIS — Z20822 Contact with and (suspected) exposure to covid-19: Secondary | ICD-10-CM | POA: Insufficient documentation

## 2020-07-29 LAB — SARS CORONAVIRUS 2 (TAT 6-24 HRS): SARS Coronavirus 2: NEGATIVE

## 2020-08-02 ENCOUNTER — Encounter (HOSPITAL_COMMUNITY): Payer: Self-pay | Admitting: Gastroenterology

## 2020-08-02 ENCOUNTER — Ambulatory Visit (HOSPITAL_COMMUNITY)
Admission: RE | Admit: 2020-08-02 | Discharge: 2020-08-02 | Disposition: A | Discharge status: Home / Self Care | Payer: Medicare Other | Attending: Gastroenterology | Admitting: Gastroenterology

## 2020-08-02 ENCOUNTER — Encounter (HOSPITAL_COMMUNITY)
Admission: RE | Disposition: A | Discharge status: Home / Self Care | Payer: Self-pay | Source: Home / Self Care | Attending: Gastroenterology

## 2020-08-02 ENCOUNTER — Other Ambulatory Visit: Payer: Self-pay

## 2020-08-02 ENCOUNTER — Ambulatory Visit (HOSPITAL_COMMUNITY): Payer: Medicare Other | Admitting: Anesthesiology

## 2020-08-02 DIAGNOSIS — Z823 Family history of stroke: Secondary | ICD-10-CM | POA: Insufficient documentation

## 2020-08-02 DIAGNOSIS — Z961 Presence of intraocular lens: Secondary | ICD-10-CM | POA: Diagnosis not present

## 2020-08-02 DIAGNOSIS — Z791 Long term (current) use of non-steroidal anti-inflammatories (NSAID): Secondary | ICD-10-CM | POA: Diagnosis not present

## 2020-08-02 DIAGNOSIS — Z9842 Cataract extraction status, left eye: Secondary | ICD-10-CM | POA: Diagnosis not present

## 2020-08-02 DIAGNOSIS — I4891 Unspecified atrial fibrillation: Secondary | ICD-10-CM | POA: Diagnosis not present

## 2020-08-02 DIAGNOSIS — Q399 Congenital malformation of esophagus, unspecified: Secondary | ICD-10-CM | POA: Diagnosis not present

## 2020-08-02 DIAGNOSIS — Z7984 Long term (current) use of oral hypoglycemic drugs: Secondary | ICD-10-CM | POA: Insufficient documentation

## 2020-08-02 DIAGNOSIS — Z7901 Long term (current) use of anticoagulants: Secondary | ICD-10-CM | POA: Diagnosis not present

## 2020-08-02 DIAGNOSIS — Z8249 Family history of ischemic heart disease and other diseases of the circulatory system: Secondary | ICD-10-CM | POA: Insufficient documentation

## 2020-08-02 DIAGNOSIS — E119 Type 2 diabetes mellitus without complications: Secondary | ICD-10-CM | POA: Diagnosis not present

## 2020-08-02 DIAGNOSIS — Z880 Allergy status to penicillin: Secondary | ICD-10-CM | POA: Insufficient documentation

## 2020-08-02 DIAGNOSIS — K228 Other specified diseases of esophagus: Secondary | ICD-10-CM | POA: Insufficient documentation

## 2020-08-02 DIAGNOSIS — I350 Nonrheumatic aortic (valve) stenosis: Secondary | ICD-10-CM | POA: Diagnosis not present

## 2020-08-02 DIAGNOSIS — Z9841 Cataract extraction status, right eye: Secondary | ICD-10-CM | POA: Insufficient documentation

## 2020-08-02 DIAGNOSIS — Z808 Family history of malignant neoplasm of other organs or systems: Secondary | ICD-10-CM | POA: Insufficient documentation

## 2020-08-02 DIAGNOSIS — Z833 Family history of diabetes mellitus: Secondary | ICD-10-CM | POA: Diagnosis not present

## 2020-08-02 DIAGNOSIS — M1990 Unspecified osteoarthritis, unspecified site: Secondary | ICD-10-CM | POA: Diagnosis not present

## 2020-08-02 DIAGNOSIS — R1310 Dysphagia, unspecified: Secondary | ICD-10-CM | POA: Insufficient documentation

## 2020-08-02 DIAGNOSIS — Z79899 Other long term (current) drug therapy: Secondary | ICD-10-CM | POA: Diagnosis not present

## 2020-08-02 DIAGNOSIS — Z952 Presence of prosthetic heart valve: Secondary | ICD-10-CM | POA: Insufficient documentation

## 2020-08-02 DIAGNOSIS — Z87891 Personal history of nicotine dependence: Secondary | ICD-10-CM | POA: Diagnosis not present

## 2020-08-02 DIAGNOSIS — Z800 Family history of malignant neoplasm of digestive organs: Secondary | ICD-10-CM | POA: Diagnosis not present

## 2020-08-02 DIAGNOSIS — Z8546 Personal history of malignant neoplasm of prostate: Secondary | ICD-10-CM | POA: Diagnosis not present

## 2020-08-02 DIAGNOSIS — E785 Hyperlipidemia, unspecified: Secondary | ICD-10-CM | POA: Diagnosis not present

## 2020-08-02 DIAGNOSIS — K222 Esophageal obstruction: Secondary | ICD-10-CM | POA: Diagnosis present

## 2020-08-02 DIAGNOSIS — I10 Essential (primary) hypertension: Secondary | ICD-10-CM | POA: Diagnosis not present

## 2020-08-02 HISTORY — PX: BALLOON DILATION: SHX5330

## 2020-08-02 HISTORY — PX: ESOPHAGOGASTRODUODENOSCOPY (EGD) WITH PROPOFOL: SHX5813

## 2020-08-02 LAB — GLUCOSE, CAPILLARY: Glucose-Capillary: 143 mg/dL — ABNORMAL HIGH (ref 70–99)

## 2020-08-02 SURGERY — ESOPHAGOGASTRODUODENOSCOPY (EGD) WITH PROPOFOL
Anesthesia: Monitor Anesthesia Care

## 2020-08-02 MED ORDER — LACTATED RINGERS IV SOLN
INTRAVENOUS | Status: DC
  Administered 2020-08-02: 1000 mL via INTRAVENOUS

## 2020-08-02 MED ORDER — SODIUM CHLORIDE 0.9 % IV SOLN: INTRAVENOUS | Status: DC

## 2020-08-02 MED ORDER — LACTATED RINGERS IV SOLN: INTRAVENOUS | Status: DC | PRN

## 2020-08-02 MED ORDER — PROPOFOL 500 MG/50ML IV EMUL
INTRAVENOUS | Status: DC | PRN
  Administered 2020-08-02 (×2): 20 mg via INTRAVENOUS

## 2020-08-02 MED ORDER — LIDOCAINE HCL (CARDIAC) PF 100 MG/5ML IV SOSY
PREFILLED_SYRINGE | INTRAVENOUS | Status: DC | PRN
  Administered 2020-08-02: 100 mg via INTRATRACHEAL

## 2020-08-02 MED ORDER — PROPOFOL 500 MG/50ML IV EMUL
INTRAVENOUS | Status: DC | PRN
  Administered 2020-08-02: 135 ug/kg/min via INTRAVENOUS

## 2020-08-02 SURGICAL SUPPLY — 15 items

## 2020-08-02 NOTE — Brief Op Note (Signed)
08/02/2020  3:17 PM  PATIENT:  [name]  86 y.o. male  PRE-OPERATIVE DIAGNOSIS:  Esophageal Stricture  POST-OPERATIVE DIAGNOSIS:  MID ESOPHAGEAL NODULE dilated at 20 for two minutes  PROCEDURE:  Procedure(s): ESOPHAGOGASTRODUODENOSCOPY (EGD) WITH PROPOFOL (N/A) BALLOON DILATION (N/A)  SURGEON:  Surgeon(s) and Role:    [name], MD - Primary  PHYSICIAN ASSISTANT:   ASSISTANTS:Donna Pickering,RN, Erenest Rasher, RN, Ladona Ridgel, Tech  ANESTHESIA:   MAC  EBL:  Minimal  BLOOD ADMINISTERED:none  DRAINS: none   LOCAL MEDICATIONS USED:  NONE  SPECIMEN:  No Specimen  DISPOSITION OF SPECIMEN:  N/A  COUNTS:  YES  TOURNIQUET:  * No tourniquets in log *  DICTATION: .Dragon Dictation  PLAN OF CARE: Discharge to home after PACU  PATIENT DISPOSITION:  PACU - hemodynamically stable.   Delay start of Pharmacological VTE agent (>24hrs) due to surgical blood loss or risk of bleeding: yes

## 2020-08-02 NOTE — Discharge Instructions (Signed)
Upper Endoscopy, Adult, Care After  This sheet gives you information about how to care for yourself after your procedure. Your health care provider may also give you more specific instructions. If you have problems or questions, contact your health care provider.  What can I expect after the procedure?  After the procedure, it is common to have:  · A sore throat.  · Mild stomach pain or discomfort.  · Bloating.  · Nausea.  Follow these instructions at home:    · Follow instructions from your health care provider about what to eat or drink after your procedure.  · Return to your normal activities as told by your health care provider. Ask your health care provider what activities are safe for you.  · Take over-the-counter and prescription medicines only as told by your health care provider.  · Do not drive for 24 hours if you were given a sedative during your procedure.  · Keep all follow-up visits as told by your health care provider. This is important.  Contact a health care provider if you have:  · A sore throat that lasts longer than one day.  · Trouble swallowing.  Get help right away if:  · You vomit blood or your vomit looks like coffee grounds.  · You have:  ? A fever.  ? Bloody, black, or tarry stools.  ? A severe sore throat or you cannot swallow.  ? Difficulty breathing.  ? Severe pain in your chest or abdomen.  Summary  · After the procedure, it is common to have a sore throat, mild stomach discomfort, bloating, and nausea.  · Do not drive for 24 hours if you were given a sedative during the procedure.  · Follow instructions from your health care provider about what to eat or drink after your procedure.  · Return to your normal activities as told by your health care provider.  This information is not intended to replace advice given to you by your health care provider. Make sure you discuss any questions you have with your health care provider.  Document Revised: 04/14/2018 Document Reviewed:  03/23/2018  Elsevier Patient Education © 2020 [employer]

## 2020-08-02 NOTE — Anesthesia Preprocedure Evaluation (Addendum)
Anesthesia Evaluation  Patient identified by MRN, date of birth, ID band Patient awake    Reviewed: Allergy & Precautions, NPO status , Patient's Chart, lab work & pertinent test results, reviewed documented beta blocker date and time   History of Anesthesia Complications Negative for: history of anesthetic complications  Airway Mallampati: II  TM Distance: >3 FB Neck ROM: Full    Dental  (+) Edentulous Upper, Edentulous Lower, Lower Dentures, Upper Dentures   Pulmonary former smoker,  07/29/2020 SARS coronavirus NEG   breath sounds clear to auscultation       Cardiovascular hypertension, Pt. on medications and Pt. on home beta blockers (-) angina+ dysrhythmias Atrial Fibrillation + Valvular Problems/Murmurs (s/p TAVR)  Rhythm:Irregular Rate:Normal  '16 ECHO: EF 55% to 60%. Indeterminant diastolic function (atrial fibrillation).  no regional wall motion  abnormalities.  Mild D shape to the interventricular septum, suggesting RV pressure/volume overload. Mild MR, TAVR with no AS or AI   Neuro/Psych negative neurological ROS     GI/Hepatic Neg liver ROS, esoph stricture: difficulty swallowing   Endo/Other  diabetes (glu 143), Oral Hypoglycemic Agents  Renal/GU      Musculoskeletal  (+) Arthritis ,   Abdominal   Peds  Hematology coumadin   Anesthesia Other Findings   Reproductive/Obstetrics                           Anesthesia Physical Anesthesia Plan  ASA: III  Anesthesia Plan: MAC   Post-op Pain Management:    Induction:   PONV Risk Score and Plan: 1 and Treatment may vary due to age or medical condition and Ondansetron  Airway Management Planned: Natural Airway and Nasal Cannula  Additional Equipment: None  Intra-op Plan:   Post-operative Plan:   Informed Consent: I have reviewed the patients History and Physical, chart, labs and discussed the procedure including the risks,  benefits and alternatives for the proposed anesthesia with the patient or authorized representative who has indicated his/her understanding and acceptance.       Plan Discussed with: CRNA and Surgeon  Anesthesia Plan Comments:        Anesthesia Quick Evaluation

## 2020-08-02 NOTE — Transfer of Care (Signed)
Immediate Anesthesia Transfer of Care Note  Patient: [name]  Procedure(s) Performed: ESOPHAGOGASTRODUODENOSCOPY (EGD) WITH PROPOFOL (N/A ) BALLOON DILATION (N/A )  Patient Location: Endoscopy Unit  Anesthesia Type:MAC  Level of Consciousness: drowsy, patient cooperative and responds to stimulation  Airway & Oxygen Therapy: Patient Spontanous Breathing and Patient connected to face mask oxygen  Post-op Assessment: Report given to RN and Post -op Vital signs reviewed and stable  Post vital signs: Reviewed and stable  Last Vitals:  Vitals Value Taken Time  BP 133/48 08/02/20 1517  Temp    Pulse 54 08/02/20 1518  Resp 16 08/02/20 1518  SpO2 100 % 08/02/20 1518  Vitals shown include unvalidated device data.  Last Pain:  Vitals:   08/02/20 1325  TempSrc: Oral  PainSc: 0-No pain         Complications: No complications documented.

## 2020-08-02 NOTE — Op Note (Signed)
[facility] Patient Name: [name] Procedure Date: 08/02/2020 MRN: [id number] Attending MD: [name] , MD Date of Birth: 1933/09/03 CSN: [id number] Age: 86 Admit Type: Outpatient Procedure:                Upper GI endoscopy Indications:              Dysphagia, For therapy of esophageal stenosis,                            Abnormal cine-esophagram Providers:                [name], MD, Carmie End, RN, Erenest Rasher,                            RN, Ladona Ridgel, Technician Referring MD:             Larae Grooms Medicines:                Monitored Anesthesia Care Complications:            No immediate complications. Estimated Blood Loss:     Estimated blood loss was minimal. Procedure:                Pre-Anesthesia Assessment:                           - Prior to the procedure, a History and Physical                            was performed, and patient medications and                            allergies were reviewed. The patient's tolerance of                            previous anesthesia was also reviewed. The risks                            and benefits of the procedure and the sedation                            options and risks were discussed with the patient.                            All questions were answered, and informed consent                            was obtained. Prior Anticoagulants: The patient has                            taken Coumadin (warfarin), last dose was 5 days                            prior to procedure. ASA Grade Assessment: III - A  patient with severe systemic disease. After                            reviewing the risks and benefits, the patient was                            deemed in satisfactory condition to undergo the                            procedure.                           After obtaining informed consent, the endoscope was                            passed under direct  vision. Throughout the                            procedure, the patient's blood pressure, pulse, and                            oxygen saturations were monitored continuously. The                            GIF-H190 ([id number]) Olympus gastroscope was                            introduced through the mouth, and advanced to the                            second part of duodenum. The upper GI endoscopy was                            accomplished without difficulty. The patient                            tolerated the procedure well. 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      The middle third of the esophagus and lower third of the esophagus were       moderately tortuous.      There was no obvious narrowing and an adult gastroscope could be       advanced without any resistance. A TTS dilator was passed through the       scope. Dilation with an 18-19-20 mm x 8 cm CRE balloon dilator was       performed to 20 mm at the gastroesophageal junction for 2 consecutive       minutes with mild mucosal disruption noted post dilation.      The entire examined stomach was normal.      The cardia and gastric fundus were normal on retroflexion.      The examined duodenum was normal. Impression:               -  Mucosal nodule found in the esophagus.                           - Tortuous esophagus.                           - Normal stomach.                           - Normal examined duodenum.                           - Dilation performed at the gastroesophageal                            junction.                           - No specimens collected. Moderate Sedation:      Patient did not receive moderate sedation for this procedure, but       instead received monitored anesthesia care. Recommendation:            - Patient has a contact number available for                            emergencies. The signs and symptoms of potential                            delayed complications were discussed with the                            patient. Return to normal activities tomorrow.                            Written discharge instructions were provided to the                            patient.                           - Resume regular diet.                           - Continue present medications.                           - Resume Coumadin (warfarin) at prior dose tomorrow. Procedure Code(s):        --- Professional ---                           [phone], Esophagogastroduodenoscopy, flexible,                            transoral; with transendoscopic balloon dilation of                            esophagus (less than 30 mm diameter) Diagnosis Code(s):        ---  Professional ---                           K22.8, Other specified diseases of esophagus                           Q39.9, Congenital malformation of esophagus,                            unspecified                           R13.10, Dysphagia, unspecified                           K22.2, Esophageal obstruction                           R93.3, Abnormal findings on diagnostic imaging of                            other parts of digestive tract CPT copyright 2019 [employer]. All rights reserved. The codes documented in this report are preliminary and upon coder review may  be revised to meet current compliance requirements. [name], MD 08/02/2020 3:16:37 PM This report has been signed electronically. Number of Addenda: 0

## 2020-08-02 NOTE — Anesthesia Postprocedure Evaluation (Signed)
Anesthesia Post Note  Patient: KYUSS HALE  Procedure(s) Performed: ESOPHAGOGASTRODUODENOSCOPY (EGD) WITH PROPOFOL (N/A ) BALLOON DILATION (N/A )     Patient location during evaluation: Endoscopy Anesthesia Type: MAC Level of consciousness: awake and alert, patient cooperative and oriented Pain management: pain level controlled Vital Signs Assessment: post-procedure vital signs reviewed and stable Respiratory status: spontaneous breathing, nonlabored ventilation and respiratory function stable Cardiovascular status: blood pressure returned to baseline and stable Postop Assessment: no apparent nausea or vomiting Anesthetic complications: no   No complications documented.  Last Vitals:  Vitals:   08/02/20 1530 08/02/20 1540  BP: (!) 147/68 (!) 174/44  Pulse: 75 77  Resp: (!) 24 20  Temp:    SpO2: 100% 97%    Last Pain:  Vitals:   08/02/20 1540  TempSrc:   PainSc: 0-No pain                 Sanyiah Kanzler,E. Trevontae Lindahl

## 2020-08-02 NOTE — H&P (Signed)
[name] is an 86 y.o. male.   Chief Complaint: Abnormal imaging HPI: 86 year old male with epigastric pain and chronic dysphagia, upper GI series from 07/14/2020 showed distal esophageal stricture preventing passage of 13 mm barium tablet, mild presbyesophagus with no abnormality of the stomach or duodenum. Past Medical History:  Diagnosis Date  . Anemia    LOW PLATELETS OTHER DAY  PER PT  . Anticoagulated on Coumadin 12/21/2012  . Aortic stenosis   . Arthritis    "left wrist; back sometimes" (01/21/2013)  . Atrial fibrillation ([place])    [name], DR COOPER  . Bradycardia   . Exertional shortness of breath   . Heart murmur    "I've had it for years; runs in the family on daddy's side" (01/21/2013)  . HTN (hypertension)   . Hyperlipemia 12/21/2012  . Prostate cancer [facility]    "had 40 tx of radiation in 2009" (01/21/2013)  . S/P aortic valve replacement with bioprosthetic valve 01/19/2013   Transcatheter Aortic Valve Replacement using 25mm Sapien bioprosthetic tissue valve via transapical approach  . Type II diabetes mellitus ([place])     Past Surgical History:  Procedure Laterality Date  . CARDIAC CATHETERIZATION  12/16/2012  . CARDIAC VALVE REPLACEMENT  01/19/2013   AVR  . CATARACT EXTRACTION W/ INTRAOCULAR LENS  IMPLANT, BILATERAL    . EYE SURGERY    . INTRAOPERATIVE TRANSESOPHAGEAL ECHOCARDIOGRAM N/A 01/19/2013   Procedure: INTRAOPERATIVE TRANSESOPHAGEAL ECHOCARDIOGRAM;  Surgeon: [name], MD;  Location: [place];  Service: Open Heart Surgery;  Laterality: N/A;  . ORIF FOREARM FRACTURE Left 1954   "compound fx" (01/21/2013)  . TRANSTHORACIC ECHOCARDIOGRAM  09/04/10, 09/07/08    Family History  Problem Relation Age of Onset  . Diabetes Mother   . Melanoma Mother 73  . Bone cancer Mother 14  . Pancreatic cancer Father 29  . Heart disease Father   . Stroke Sister   . Cancer Brother 20       ? brain cancer   Social History:  reports that he quit smoking about 43  years ago. His smoking use included cigarettes. He has a 44.00 pack-year smoking history. He has never used smokeless tobacco. He reports that he does not drink alcohol and does not use drugs.  Allergies:  Allergies  Allergen Reactions  . Amoxil [Amoxicillin]     Burning of mouth, tongue and lips.    Medications Prior to Admission  Medication Sig Dispense Refill  . Ascorbic Acid (VITAMIN C) 1000 MG tablet Take 1,000 mg by mouth 2 (two) times daily.     . B Complex Vitamins (B COMPLEX 50) TABS Take 1 tablet by mouth daily.     . carvedilol (COREG) 12.5 MG tablet Take 1 tablet by mouth twice daily (Patient taking differently: Take 12.5 mg by mouth 2 (two) times daily with a meal. ) 180 tablet 3  . cholecalciferol (VITAMIN D) 1000 UNITS tablet Take 1,000 Units by mouth 2 (two) times daily.    Marland Kitchen doxazosin (CARDURA) 8 MG tablet Take 8 mg by mouth daily.     . furosemide (LASIX) 40 MG tablet Take 1 tablet by mouth once daily (Patient taking differently: Take 40 mg by mouth daily. ) 90 tablet 2  . glipiZIDE (GLUCOTROL) 5 MG tablet Take 5 mg by mouth 2 (two) times daily before a meal.     . lisinopril (ZESTRIL) 20 MG tablet Take 1 tablet (20 mg total) by mouth daily. Must schedule overdue follow up appt  for further refills (Patient taking differently: Take 20 mg by mouth daily. ) 90 tablet 0  . metFORMIN (GLUCOPHAGE) 1000 MG tablet Take 1,000 mg by mouth 2 (two) times daily with a meal.     . Potassium Gluconate 595 MG CAPS Take 595 mg by mouth 2 (two) times daily.    . rosuvastatin (CRESTOR) 5 MG tablet TAKE 1 TABLET BY MOUTH ONCE DAILY 6 IN THE EVENING (Patient taking differently: Take 5 mg by mouth daily. ) 90 tablet 3  . Selenium 200 MCG CAPS Take 200 mcg by mouth every morning.     . vitamin A 8000 UNIT capsule Take 8,000 Units by mouth every morning.    . vitamin B-12 (CYANOCOBALAMIN) 1000 MCG tablet Take 1,000 mcg by mouth 2 (two) times daily.    Marland Kitchen warfarin (COUMADIN) 5 MG tablet Take 2.5-5  mg by mouth See admin instructions. Take 2.5 mg by mouth on Monday and Friday and take 5 mg on Tuesday-Thursday, Saturday and Sunday    . zinc gluconate 50 MG tablet Take 50 mg by mouth every morning.     Marland Kitchen ACCU-CHEK AVIVA PLUS test strip USE TO CHECK BLOOD SUGARS 1 TO 2 TIMES A DAY  3  . ACCU-CHEK SOFTCLIX LANCETS lancets daily. use as directed  3  . amLODipine (NORVASC) 5 MG tablet Take 5 mg by mouth daily.  (Patient not taking: Reported on 07/25/2020)    . ketoconazole (NIZORAL) 2 % cream APPLY TO BACK SIDE TWICE DAILY AS NEEDED (Patient not taking: Reported on 07/25/2020)  3  . NONFORMULARY OR COMPOUNDED ITEM Antifungal solution: Terbinafine 3%, Fluconazole 2%, Tea Tree Oil 5%, Urea 10%, Ibuprofen 2% in DMSO suspension #3mL 1 each 3  . NONFORMULARY OR COMPOUNDED ITEM Duke's Magic Mouthwash 114ml - Hydrocortisone 60mg , Nystatin suspension 31ml, Diphenhydramine 12.5mg /75ml QS 284ml. Swish 1 teaspoon and spit 3 times a day. (Patient not taking: Reported on 07/25/2020) 120 each 0  . nystatin (MYCOSTATIN) 100000 UNIT/ML suspension Take by mouth. (Patient not taking: Reported on 07/25/2020)      No results found for this or any previous visit (from the past 48 hour(s)). No results found.  Review of Systems  All other systems reviewed and are negative.   There were no vitals taken for this visit. Physical Exam Constitutional:      Appearance: Normal appearance.  HENT:     Head: Normocephalic and atraumatic.  Eyes:     Conjunctiva/sclera: Conjunctivae normal.  Cardiovascular:     Rate and Rhythm: Normal rate.  Pulmonary:     Effort: Pulmonary effort is normal.  Abdominal:     General: Bowel sounds are normal.  Skin:    General: Skin is warm.  Neurological:     Mental Status: He is alert.  Psychiatric:        Mood and Affect: Mood normal.      Assessment/Plan Distal esophageal stricture Dysphagia Patient to hold Coumadin for 5 days prior to procedure, plan EGD with balloon  dilatation. The risks and the benefits of the procedure were discussed with the patient in details. He understands and verbalizes consent.  [name], MD 08/02/2020, 1:02 PM

## 2020-08-03 ENCOUNTER — Encounter (HOSPITAL_COMMUNITY): Payer: Self-pay | Admitting: Gastroenterology

## 2020-08-04 ENCOUNTER — Ambulatory Visit: Payer: Medicare Other | Admitting: Internal Medicine

## 2020-08-14 ENCOUNTER — Other Ambulatory Visit: Payer: Self-pay | Admitting: Internal Medicine

## 2020-08-18 ENCOUNTER — Ambulatory Visit (HOSPITAL_COMMUNITY): Payer: Medicare Other | Attending: Cardiology

## 2020-08-18 ENCOUNTER — Other Ambulatory Visit: Payer: Self-pay

## 2020-08-18 DIAGNOSIS — I4819 Other persistent atrial fibrillation: Secondary | ICD-10-CM | POA: Diagnosis not present

## 2020-08-18 DIAGNOSIS — Z01818 Encounter for other preprocedural examination: Secondary | ICD-10-CM

## 2020-08-18 DIAGNOSIS — I350 Nonrheumatic aortic (valve) stenosis: Secondary | ICD-10-CM | POA: Diagnosis present

## 2020-08-18 DIAGNOSIS — I10 Essential (primary) hypertension: Secondary | ICD-10-CM | POA: Diagnosis present

## 2020-08-18 LAB — ECHOCARDIOGRAM COMPLETE
AV Mean grad: 7.6 mmHg
AV Peak grad: 14.2 mmHg
Ao pk vel: 1.88 m/s
Area-P 1/2: 3.72 cm2
MV M vel: 5.87 m/s
MV Peak grad: 137.8 mmHg
Radius: 0.8 cm
S' Lateral: 3.5 cm

## 2020-08-18 MED ORDER — PERFLUTREN LIPID MICROSPHERE
1.0000 mL | INTRAVENOUS | Status: AC | PRN
  Administered 2020-08-18: 1 mL via INTRAVENOUS

## 2020-08-22 ENCOUNTER — Telehealth: Payer: Self-pay

## 2020-08-22 DIAGNOSIS — I50810 Right heart failure, unspecified: Secondary | ICD-10-CM

## 2020-08-22 DIAGNOSIS — I272 Pulmonary hypertension, unspecified: Secondary | ICD-10-CM

## 2020-08-22 NOTE — Telephone Encounter (Signed)
-----   Message from [name], MD sent at 08/20/2020 10:02 PM EDT ----- His LVfunction is normal. He has RV dysfunction and pulmonary HTN. His pressures are only minimally increased from 5 years ago. I would like him to followup with Dr. Burt Knack.

## 2020-08-22 NOTE — Telephone Encounter (Signed)
Call returned to Pt with echo results.  Advised Pt is having some right ventricle dysfunction and pulmonary hypertension.  Per Dr. Lovena Le refer to general cards for follow up.

## 2020-09-10 ENCOUNTER — Other Ambulatory Visit: Payer: Self-pay | Admitting: Internal Medicine

## 2020-09-11 ENCOUNTER — Other Ambulatory Visit: Payer: Self-pay

## 2020-09-11 ENCOUNTER — Ambulatory Visit: Payer: Medicare Other | Admitting: Internal Medicine

## 2020-09-11 ENCOUNTER — Encounter: Payer: Self-pay | Admitting: Internal Medicine

## 2020-09-11 VITALS — BP 122/60 | HR 63 | Ht 68.0 in | Wt 177.0 lb

## 2020-09-11 DIAGNOSIS — Z952 Presence of prosthetic heart valve: Secondary | ICD-10-CM

## 2020-09-11 DIAGNOSIS — I5032 Chronic diastolic (congestive) heart failure: Secondary | ICD-10-CM | POA: Diagnosis not present

## 2020-09-11 DIAGNOSIS — I340 Nonrheumatic mitral (valve) insufficiency: Secondary | ICD-10-CM | POA: Diagnosis not present

## 2020-09-11 DIAGNOSIS — I10 Essential (primary) hypertension: Secondary | ICD-10-CM

## 2020-09-11 DIAGNOSIS — I361 Nonrheumatic tricuspid (valve) insufficiency: Secondary | ICD-10-CM | POA: Diagnosis not present

## 2020-09-11 DIAGNOSIS — I739 Peripheral vascular disease, unspecified: Secondary | ICD-10-CM

## 2020-09-11 DIAGNOSIS — E119 Type 2 diabetes mellitus without complications: Secondary | ICD-10-CM

## 2020-09-11 NOTE — Progress Notes (Signed)
Cardiology Office Note:    Date:  09/11/2020   ID:  CHALLEN SPAINHOUR, DOB 1933/08/16, MRN [id number]  PCP:  [name], MD  [facility] Cardiologist:  No primary care provider on file.  [facility] Electrophysiologist:  None   Referring MD: [name], MD   CC:  Feels winded Consulted for the evaluation of mitral regurgitation at the behest of [name], MD  History of Present Illness:    [name] is a 86 y.o. male with a hx of Severe aortic stenosis s/p transapical TAVR [name] 28, Dr. Burt Knack, 2014), HFpEF,  Persistent atrial fibrillation seen by Dr. Lovena Le, PAD seen by Dr. [name], prostate cancer s/p radiation, and DM with HTN.  Coronary Artery Calcification.    Patient presents for after new diagnosis of moderate mitral regurgitation with secondary pulmonary hypertension.  Patient notes that he no shortness of breath or  DOE.  Every day he climbs steps, and goes to the grocery store.  With that that, he has had leg pain but.  Notes weight gain, but notes weight loss.  No PND, orthopnea (sleeps in a recliner because of leg pain).  No chest pain.No syncope or near syncope.  Past Medical History:  Diagnosis Date  . Anemia    LOW PLATELETS OTHER DAY  PER PT  . Anticoagulated on Coumadin 12/21/2012  . Aortic stenosis   . Arthritis    "left wrist; back sometimes" (01/21/2013)  . Atrial fibrillation ([place])    [name], DR COOPER  . Bradycardia   . Exertional shortness of breath   . Heart murmur    "I've had it for years; runs in the family on daddy's side" (01/21/2013)  . HTN (hypertension)   . Hyperlipemia 12/21/2012  . Prostate cancer [facility])    "had 40 tx of radiation in 2009" (01/21/2013)  . S/P aortic valve replacement with bioprosthetic valve 01/19/2013   Transcatheter Aortic Valve Replacement using 71mm Sapien bioprosthetic tissue valve via transapical approach  . Type II diabetes mellitus ([place])     Past Surgical History:  Procedure Laterality Date   . BALLOON DILATION N/A 08/02/2020   Procedure: BALLOON DILATION;  Surgeon: [name], MD;  Location: [facility];  Service: Gastroenterology;  Laterality: N/A;  . CARDIAC CATHETERIZATION  12/16/2012  . CARDIAC VALVE REPLACEMENT  01/19/2013   AVR  . CATARACT EXTRACTION W/ INTRAOCULAR LENS  IMPLANT, BILATERAL    . ESOPHAGOGASTRODUODENOSCOPY (EGD) WITH PROPOFOL N/A 08/02/2020   Procedure: ESOPHAGOGASTRODUODENOSCOPY (EGD) WITH PROPOFOL;  Surgeon: [name], MD;  Location: [facility];  Service: Gastroenterology;  Laterality: N/A;  . EYE SURGERY    . INTRAOPERATIVE TRANSESOPHAGEAL ECHOCARDIOGRAM N/A 01/19/2013   Procedure: INTRAOPERATIVE TRANSESOPHAGEAL ECHOCARDIOGRAM;  Surgeon: [name], MD;  Location: [facility];  Service: Open Heart Surgery;  Laterality: N/A;  . ORIF FOREARM FRACTURE Left 1954   "compound fx" (01/21/2013)  . TRANSTHORACIC ECHOCARDIOGRAM  09/04/10, 09/07/08    Current Medications: Current Meds  Medication Sig  . amLODipine (NORVASC) 5 MG tablet Take 5 mg by mouth daily.   . Ascorbic Acid (VITAMIN C) 1000 MG tablet Take 1,000 mg by mouth 2 (two) times daily.   . B Complex Vitamins (B COMPLEX 50) TABS Take 1 tablet by mouth daily.   . carvedilol (COREG) 12.5 MG tablet Take 1 tablet by mouth twice daily (Patient taking differently: Take 12.5 mg by mouth 2 (two) times daily with a meal. )  . cholecalciferol (VITAMIN D) 1000 UNITS tablet Take 1,000  Units by mouth 2 (two) times daily.  Marland Kitchen doxazosin (CARDURA) 8 MG tablet Take 8 mg by mouth daily.   . ferrous sulfate 325 (65 FE) MG tablet Take 325 mg by mouth daily.  . furosemide (LASIX) 40 MG tablet Take 1 tablet by mouth once daily (Patient taking differently: Take 40 mg by mouth daily. )  . glipiZIDE (GLUCOTROL) 5 MG tablet Take 5 mg by mouth 2 (two) times daily before a meal.   . lisinopril (ZESTRIL) 20 MG tablet Take 1 tablet by mouth once daily  . metFORMIN (GLUCOPHAGE) 1000 MG tablet Take 1,000 mg by mouth 2 (two) times  daily with a meal.   . Omega 3-6-9 Fatty Acids (OMEGA-3-6-9 PO) Take by mouth 2 (two) times daily.  . pantoprazole (PROTONIX) 40 MG tablet Take 40 mg by mouth daily.  . Potassium Gluconate 595 MG CAPS Take 99 mg by mouth 2 (two) times daily.   . rosuvastatin (CRESTOR) 5 MG tablet TAKE 1 TABLET BY MOUTH ONCE DAILY 6 IN THE EVENING (Patient taking differently: Take 5 mg by mouth daily. )  . Selenium 200 MCG CAPS Take 200 mcg by mouth every morning.   . traMADol-acetaminophen (ULTRACET) 37.5-325 MG tablet Take 1 tablet by mouth every 6 (six) hours as needed.  . vitamin A 8000 UNIT capsule Take 8,000 Units by mouth every morning.  . vitamin B-12 (CYANOCOBALAMIN) 1000 MCG tablet Take 1,000 mcg by mouth 2 (two) times daily.  Marland Kitchen warfarin (COUMADIN) 5 MG tablet Take 2.5-5 mg by mouth See admin instructions. Take 2.5 mg by mouth on Monday and Friday and take 5 mg on Tuesday-Thursday, Saturday and Sunday  . zinc gluconate 50 MG tablet Take 50 mg by mouth every morning.      Allergies:   Amoxil [amoxicillin]   Social History   Socioeconomic History  . Marital status: Married    Spouse name: Not on file  . Number of children: Not on file  . Years of education: Not on file  . Highest education level: Not on file  Occupational History  . Not on file  Tobacco Use  . Smoking status: Former Smoker    Packs/day: 2.00    Years: 22.00    Pack years: 44.00    Types: Cigarettes    Quit date: 09/11/1976    Years since quitting: 44.0  . Smokeless tobacco: Never Used  Substance and Sexual Activity  . Alcohol use: No  . Drug use: No  . Sexual activity: Never  Other Topics Concern  . Not on file  Social History Narrative  . Not on file   Social Determinants of Health   Financial Resource Strain:   . Difficulty of Paying Living Expenses: Not on file  Food Insecurity:   . Worried About Charity fundraiser in the Last Year: Not on file  . Ran Out of Food in the Last Year: Not on file   Transportation Needs:   . Lack of Transportation (Medical): Not on file  . Lack of Transportation (Non-Medical): Not on file  Physical Activity:   . Days of Exercise per Week: Not on file  . Minutes of Exercise per Session: Not on file  Stress:   . Feeling of Stress : Not on file  Social Connections:   . Frequency of Communication with Friends and Family: Not on file  . Frequency of Social Gatherings with Friends and Family: Not on file  . Attends Religious Services: Not on file  .  Active Member of Clubs or Organizations: Not on file  . Attends Archivist Meetings: Not on file  . Marital Status: Not on file    Family History: The patient's family history includes Bone cancer (age of onset: 63) in his mother; Cancer (age of onset: 50) in his brother; Diabetes in his mother; Heart disease in his father; Melanoma (age of onset: 83) in his mother; Pancreatic cancer (age of onset: 8) in his father; Stroke in his sister.  ROS:   Please see the history of present illness.    Notes neuropathic pains and arthritis. All other systems reviewed and are negative.  EKGs/Labs/Other Studies Reviewed:    The following studies were reviewed today:  EKG:  EKG is ordered today.  The ekg ordered today demonstrates atrial fibrillation rate 63  Recent Labs: No results found for requested labs within last 8760 hours.  Recent Lipid Panel    Component Value Date/Time   CHOL 81 (L) 08/17/2018 0748   TRIG 82 08/17/2018 0748   HDL 28 (L) 08/17/2018 0748   CHOLHDL 2.9 08/17/2018 0748   LDLCALC 37 08/17/2018 0748   Echo 08/18/20: Personally reviewed Moderate eccentric mitral regurgitation without prolapse Moderate tricuspid regurgitation Normal LV function Mild RV dysfunction No para-valvular leak normal TAVR gradients  Risk Assessment/Calculations:     CHA2DS2-VASc Score = 5  This indicates a 7.2% annual risk of stroke. The patient's score is based upon: CHF History: 0 HTN  History: 1 Diabetes History: 1 Stroke History: 0 Vascular Disease History: 1 Age Score: 2 Gender Score: 0      Physical Exam:    VS:  BP 122/60   Pulse 63   Ht 5\' 8"  (1.727 m)   Wt 177 lb (80.3 kg)   BMI 26.91 kg/m     Wt Readings from Last 3 Encounters:  09/11/20 177 lb (80.3 kg)  08/02/20 173 lb (78.5 kg)  07/19/20 175 lb (79.4 kg)    TIR:WERXVQM male, well developed in no acute distress HEENT: Normal NECK: No JVD; No carotid bruits LYMPHATICS: No lymphadenopathy CARDIAC: irregular irregular, holosystolic II/VI murmur, rubs, gallops RESPIRATORY:  Clear to auscultation without rales, wheezing or rhonchi  ABDOMEN: Soft, non-tender, non-distended MUSCULOSKELETAL:  +1 edema with compression stockings (zip ups); No deformity  SKIN: Warm and dry NEUROLOGIC:  Alert and oriented x 3 PSYCHIATRIC:  Normal affect   ASSESSMENT:    1. Chronic heart failure with preserved ejection fraction ([place])   2. Diabetes mellitus with coincident hypertension ([place])   3. Nonrheumatic mitral valve regurgitation   4. Nonrheumatic tricuspid valve regurgitation   5. PAD (peripheral artery disease) ([place])   6. S/P TAVR (transcatheter aortic valve replacement)    PLAN:    In order of problems listed above:  Moderate Mitral Regurgitation Moderate Tricuspid Regurgitation Severe AS s/p TAVR HFpEF Diabetes with HTN Without frequent UTI but with prostate cancer; no prior DKA, and PAD Recent esophageal dilation - NYHA class I, Stage B,  etiology likely atrial functional MR - patient notes significant response to 40 mg lasix - check BMP, BNP, Mag - Continue BB and ACEi - if elevated BNP; discussed use of Farxiga - Will need echo in 6 months - deferring TEE at this time in the setting of recent esophageal dilation  Long standing, persistent atrial fibrillation on coumadin seen by Dr. Lovena Le - BB as above, [facility] per EP and Phamacy   Discussed goals of care with patient:  He lives with his wife  and has one daugther who help him make decisions.  He does not feel short of breath and his main goal is to improve his neuropathy and arthritis.  4 month follow up unless new symptoms or abnormal test results warranting change in plan   Shared Decision Making/Informed Consent      Medication Adjustments/Labs and Tests Ordered: Current medicines are reviewed at length with the patient today.  Concerns regarding medicines are outlined above.  Orders Placed This Encounter  Procedures  . Basic metabolic panel  . Pro b natriuretic peptide  . Magnesium  . EKG 12-Lead   No orders of the defined types were placed in this encounter.   Patient Instructions  Medication Instructions:   Your physician recommends that you continue on your current medications as directed. Please refer to the Current Medication list given to you today.  *If you need a refill on your cardiac medications before your next appointment, please call your pharmacy*   Lab Work:  TODAY--BMET, PRO-BNP, AND MAGNESIUM LEVEL.  If you have labs (blood work) drawn today and your tests are completely normal, you will receive your results only by: Marland Kitchen MyChart Message (if you have MyChart) OR . A paper copy in the mail If you have any lab test that is abnormal or we need to change your treatment, we will call you to review the results.   Follow-Up:  4 MONTHS IN THE OFFICE WITH DR. Gasper Sells       Signed, [name], MD  09/11/2020 11:54 AM    [place]

## 2020-09-11 NOTE — Patient Instructions (Signed)
Medication Instructions:   Your physician recommends that you continue on your current medications as directed. Please refer to the Current Medication list given to you today.  *If you need a refill on your cardiac medications before your next appointment, please call your pharmacy*   Lab Work:  TODAY--BMET, PRO-BNP, AND MAGNESIUM LEVEL.  If you have labs (blood work) drawn today and your tests are completely normal, you will receive your results only by: Marland Kitchen MyChart Message (if you have MyChart) OR . A paper copy in the mail If you have any lab test that is abnormal or we need to change your treatment, we will call you to review the results.   Follow-Up:  4 MONTHS IN THE OFFICE WITH DR. Gasper Sells

## 2020-09-12 LAB — BASIC METABOLIC PANEL
BUN/Creatinine Ratio: 33 — ABNORMAL HIGH (ref 10–24)
BUN: 39 mg/dL — ABNORMAL HIGH (ref 8–27)
CO2: 26 mmol/L (ref 20–29)
Calcium: 9.3 mg/dL (ref 8.6–10.2)
Chloride: 102 mmol/L (ref 96–106)
Creatinine, Ser: 1.17 mg/dL (ref 0.76–1.27)
GFR calc Af Amer: 65 mL/min/{1.73_m2} (ref >59)
GFR calc non Af Amer: 57 mL/min/{1.73_m2} — ABNORMAL LOW (ref >59)
Glucose: 127 mg/dL — ABNORMAL HIGH (ref 65–99)
Potassium: 5.5 mmol/L — ABNORMAL HIGH (ref 3.5–5.2)
Sodium: 140 mmol/L (ref 134–144)

## 2020-09-12 LAB — MAGNESIUM: Magnesium: 2.2 mg/dL (ref 1.6–2.3)

## 2020-09-12 LAB — PRO B NATRIURETIC PEPTIDE: NT-Pro BNP: 3069 pg/mL — ABNORMAL HIGH (ref 0–486)

## 2020-09-13 ENCOUNTER — Telehealth: Payer: Self-pay

## 2020-09-13 DIAGNOSIS — Z79899 Other long term (current) drug therapy: Secondary | ICD-10-CM

## 2020-09-13 DIAGNOSIS — E875 Hyperkalemia: Secondary | ICD-10-CM

## 2020-09-13 MED ORDER — DAPAGLIFLOZIN PROPANEDIOL 10 MG PO TABS: 10.0000 mg | ORAL_TABLET | Freq: Every day | ORAL | 2 refills | Status: DC

## 2020-09-13 MED ORDER — FUROSEMIDE 40 MG PO TABS
40.0000 mg | ORAL_TABLET | Freq: Two times a day (BID) | ORAL | 3 refills | Status: DC
Start: 2020-09-13 — End: 2020-09-22

## 2020-09-13 NOTE — Telephone Encounter (Signed)
-----   Message from Rodman Key, RN sent at 09/13/2020  2:43 PM EST -----  ----- Message ----- From: [name], MD Sent: 09/12/2020   3:04 PM EST To: Rodman Key, RN  Results: Elevated potassium, on potassium, elevated BNP, stable kidney function Plan: Will stop potassium, Will increase lasix to 40 mg BID Start Farxiga 10 mg Will check BMP  in 7-10 days.  [name], MD

## 2020-09-13 NOTE — Telephone Encounter (Signed)
Spoke with pt and advised per Dr Gasper Sells:  1.  Stop Potassium d/t elevated Potassium level. 2.  Increase Lasix to 40mg  - 1 tablet by mouth bid d/t elevated BNP. 3.  Start Farxiga 10mg  - 1 tablet by mouth before breakfast 4.  Repeat BMET 09/22/2020.  Pt may come anytime between 8am and 430pm.  Pt verbalized understanding and agrees with current plan.

## 2020-09-14 ENCOUNTER — Other Ambulatory Visit (HOSPITAL_COMMUNITY): Payer: Self-pay | Admitting: Urology

## 2020-09-14 DIAGNOSIS — C61 Malignant neoplasm of prostate: Secondary | ICD-10-CM

## 2020-09-14 DIAGNOSIS — R9721 Rising PSA following treatment for malignant neoplasm of prostate: Secondary | ICD-10-CM

## 2020-09-22 ENCOUNTER — Telehealth: Payer: Self-pay

## 2020-09-22 ENCOUNTER — Other Ambulatory Visit: Payer: Self-pay

## 2020-09-22 ENCOUNTER — Other Ambulatory Visit: Payer: Medicare Other | Admitting: *Deleted

## 2020-09-22 ENCOUNTER — Telehealth: Payer: Self-pay | Admitting: *Deleted

## 2020-09-22 DIAGNOSIS — R799 Abnormal finding of blood chemistry, unspecified: Secondary | ICD-10-CM

## 2020-09-22 DIAGNOSIS — R7989 Other specified abnormal findings of blood chemistry: Secondary | ICD-10-CM

## 2020-09-22 LAB — BASIC METABOLIC PANEL
BUN/Creatinine Ratio: 46 — ABNORMAL HIGH (ref 10–24)
BUN: 76 mg/dL (ref 8–27)
CO2: 25 mmol/L (ref 20–29)
Calcium: 9.1 mg/dL (ref 8.6–10.2)
Chloride: 96 mmol/L (ref 96–106)
Creatinine, Ser: 1.64 mg/dL — ABNORMAL HIGH (ref 0.76–1.27)
GFR calc Af Amer: 43 mL/min/{1.73_m2} — ABNORMAL LOW (ref >59)
GFR calc non Af Amer: 38 mL/min/{1.73_m2} — ABNORMAL LOW (ref >59)
Glucose: 240 mg/dL — ABNORMAL HIGH (ref 65–99)
Potassium: 4.7 mmol/L (ref 3.5–5.2)
Sodium: 136 mmol/L (ref 134–144)

## 2020-09-22 MED ORDER — FUROSEMIDE 40 MG PO TABS: 40.0000 mg | ORAL_TABLET | Freq: Every day | ORAL | 3 refills | Status: DC

## 2020-09-22 NOTE — Telephone Encounter (Signed)
-----   Message from [name], MD sent at 09/22/2020  5:01 PM EST ----- Results: Elevated kidney function since last visit (Lasix to BID) Plan: Let's return lasix to 40 mg PO daily; recheck BMP in 7 days.  If we do not see improvement, let's bring him back earlier and see whats' going on.  [name], MD

## 2020-09-22 NOTE — Telephone Encounter (Signed)
The patient has been notified of the result and verbalized understanding.  All questions (if any) were answered. Michaelyn Barter, RN 09/22/2020 5:07 PM   Patient will come in on 10/02/20 for lab work since next week we are closed at the end of the week. Patient will start taking Lasix 40 mg daily.

## 2020-09-22 NOTE — Telephone Encounter (Signed)
-----   Message from [name], MD sent at 09/21/2020  6:13 PM EST ----- Can we remind him of his labs? ----- Message ----- From: SYSTEM Sent: 09/18/2020  12:08 AM EST To: [name], MD

## 2020-09-22 NOTE — Telephone Encounter (Signed)
Left message to remind patient about labs at he [address] office.

## 2020-09-29 ENCOUNTER — Other Ambulatory Visit: Payer: Self-pay

## 2020-09-29 ENCOUNTER — Ambulatory Visit (HOSPITAL_COMMUNITY)
Admission: RE | Admit: 2020-09-29 | Discharge: 2020-09-29 | Disposition: A | Discharge status: Home / Self Care | Payer: Medicare Other | Source: Ambulatory Visit | Attending: Urology | Admitting: Urology

## 2020-09-29 DIAGNOSIS — C61 Malignant neoplasm of prostate: Secondary | ICD-10-CM

## 2020-09-29 DIAGNOSIS — R9721 Rising PSA following treatment for malignant neoplasm of prostate: Secondary | ICD-10-CM | POA: Insufficient documentation

## 2020-09-29 MED ORDER — PIFLIFOLASTAT F 18 (PYLARIFY) INJECTION
9.0000 | Freq: Once | INTRAVENOUS | Status: AC
  Administered 2020-09-29: 9.49 via INTRAVENOUS

## 2020-10-02 ENCOUNTER — Other Ambulatory Visit: Payer: Medicare Other | Admitting: *Deleted

## 2020-10-02 ENCOUNTER — Telehealth: Payer: Self-pay | Admitting: *Deleted

## 2020-10-02 ENCOUNTER — Other Ambulatory Visit: Payer: Self-pay

## 2020-10-02 DIAGNOSIS — R7989 Other specified abnormal findings of blood chemistry: Secondary | ICD-10-CM

## 2020-10-02 DIAGNOSIS — R799 Abnormal finding of blood chemistry, unspecified: Secondary | ICD-10-CM

## 2020-10-02 DIAGNOSIS — E875 Hyperkalemia: Secondary | ICD-10-CM

## 2020-10-02 LAB — BASIC METABOLIC PANEL
BUN/Creatinine Ratio: 34 — ABNORMAL HIGH (ref 10–24)
BUN: 47 mg/dL — ABNORMAL HIGH (ref 8–27)
CO2: 25 mmol/L (ref 20–29)
Calcium: 9.2 mg/dL (ref 8.6–10.2)
Chloride: 98 mmol/L (ref 96–106)
Creatinine, Ser: 1.38 mg/dL — ABNORMAL HIGH (ref 0.76–1.27)
GFR calc Af Amer: 54 mL/min/{1.73_m2} — ABNORMAL LOW (ref >59)
GFR calc non Af Amer: 46 mL/min/{1.73_m2} — ABNORMAL LOW (ref >59)
Glucose: 229 mg/dL — ABNORMAL HIGH (ref 65–99)
Potassium: 5.4 mmol/L — ABNORMAL HIGH (ref 3.5–5.2)
Sodium: 136 mmol/L (ref 134–144)

## 2020-10-02 NOTE — Telephone Encounter (Signed)
Patient notified. He stopped potassium but then resumed about a week or so ago.  He did this because he felt potassium worked with his pancreas to help with insulin levels.  Patient instructed to stop potassium now.  He states he is going to eat 2 bananas a day to help with blood sugar.  He is concerned about his elevated blood sugar levels and thinks [place] may need to be increased.  He doesn't want his blood sugar to be above 140 in the AM and 150 in the PM.  Has been running in the 140's recently in the AM. Will forward to Dr Gasper Sells to see if follow up is office visit or repeat lab work and when it should be scheduled.

## 2020-10-02 NOTE — Telephone Encounter (Signed)
-----   Message from [name], MD sent at 10/02/2020  5:11 PM EST ----- Results: Elevated K, improved creatinine Plan: Let's make sure he is truly not taking potassium any more.  It's still on his MAR and I wanted to make sure we don't miss anything.  I'd like to bring him back because I want to make sure his kidney function, volume status, and fluid is in ourder  [name], MD

## 2020-10-04 NOTE — Telephone Encounter (Signed)
I spoke with patient.  He has stopped potassium.  Patient will come in for BMP on 10/13/20 and appointment with Dr Gasper Sells on 10/31/20 at 8:20

## 2020-10-04 NOTE — Addendum Note (Signed)
Addended by: Thompson Grayer on: 10/04/2020 08:46 AM   Modules accepted: Orders

## 2020-10-13 ENCOUNTER — Other Ambulatory Visit: Payer: Self-pay

## 2020-10-13 ENCOUNTER — Other Ambulatory Visit: Payer: Medicare Other | Admitting: *Deleted

## 2020-10-13 DIAGNOSIS — R7989 Other specified abnormal findings of blood chemistry: Secondary | ICD-10-CM

## 2020-10-13 DIAGNOSIS — E875 Hyperkalemia: Secondary | ICD-10-CM

## 2020-10-13 LAB — BASIC METABOLIC PANEL
BUN/Creatinine Ratio: 38 — ABNORMAL HIGH (ref 10–24)
BUN: 54 mg/dL — ABNORMAL HIGH (ref 8–27)
CO2: 22 mmol/L (ref 20–29)
Calcium: 9.3 mg/dL (ref 8.6–10.2)
Chloride: 99 mmol/L (ref 96–106)
Creatinine, Ser: 1.41 mg/dL — ABNORMAL HIGH (ref 0.76–1.27)
GFR calc Af Amer: 52 mL/min/{1.73_m2} — ABNORMAL LOW (ref >59)
GFR calc non Af Amer: 45 mL/min/{1.73_m2} — ABNORMAL LOW (ref >59)
Glucose: 201 mg/dL — ABNORMAL HIGH (ref 65–99)
Potassium: 5.2 mmol/L (ref 3.5–5.2)
Sodium: 137 mmol/L (ref 134–144)

## 2020-10-16 ENCOUNTER — Other Ambulatory Visit: Payer: Self-pay | Admitting: Internal Medicine

## 2020-10-16 ENCOUNTER — Telehealth: Payer: Self-pay

## 2020-10-16 DIAGNOSIS — Z79899 Other long term (current) drug therapy: Secondary | ICD-10-CM

## 2020-10-16 NOTE — Telephone Encounter (Signed)
Patient returning call.

## 2020-10-16 NOTE — Telephone Encounter (Signed)
Left a message to call the office back regarding results.

## 2020-10-16 NOTE — Telephone Encounter (Signed)
-----   Message from [name], MD sent at 10/15/2020  1:22 PM EST ----- Results: Still elevated creatinine; Stop Lasix  Plan: Will get BMP prior to visit; will get Cardiopulmonary Exercise Test after 10/31/20 visit  [name], MD

## 2020-10-16 NOTE — Telephone Encounter (Signed)
The patient has been notified of the result and verbalized understanding.  All questions (if any) were answered.  Patient lab appointment made for 12/23 for BMP prior to visit.   Wilma Flavin, RN 10/16/2020 3:57 PM

## 2020-10-23 ENCOUNTER — Telehealth: Payer: Self-pay | Admitting: Internal Medicine

## 2020-10-23 ENCOUNTER — Ambulatory Visit: Payer: Medicare Other | Admitting: Podiatry

## 2020-10-23 NOTE — Telephone Encounter (Signed)
Advised patient of recommendations from Dr. Gasper Sells. Patient verbalized understanding.

## 2020-10-23 NOTE — Telephone Encounter (Signed)
Spoke with the patient who reports that he is having swelling in his legs and some weight gain since stopping lasix one week ago. He reports some SOB with exertion as well. He has not increased salt in his diet. Advised patient to elevate his legs to help with the swelling. He is coming into the office tomorrow for BNP.

## 2020-10-23 NOTE — Telephone Encounter (Signed)
Pt c/o swelling: STAT is pt has developed SOB within 24 hours  1) How much weight have you gained and in what time span? Since being taken off the Lasix has gained 9 lbs (less than two weeks)  2) If swelling, where is the swelling located? Legs (states they are weeping)  3) Are you currently taking a fluid pill? No   4) Are you currently SOB? No, but occurs upon exertion   5) Do you have a log of your daily weights (if so, list)? No   6) Have you gained 3 pounds in a day or 5 pounds in a week? Yes 8-9 lbs in two weeks    7) Have you traveled recently? No   [name] is calling stating he has gaines 8-9lbs since stopping his Lasix. He states  His legs are weeping and it is hard for him to walk due to this. He also reports having symptoms of SOB upon exertion. Please advise.

## 2020-10-23 NOTE — Telephone Encounter (Signed)
Let's have him start the lasix after tomorrows lab work.  We have been having a tough time finding the balance between treating his kidney function and his volume status.  After tomorrow we can give lasix 20 mg PO daily and I will see in on 10/31/20

## 2020-10-24 ENCOUNTER — Other Ambulatory Visit: Payer: Self-pay

## 2020-10-24 ENCOUNTER — Other Ambulatory Visit: Payer: Medicare Other

## 2020-10-24 DIAGNOSIS — Z79899 Other long term (current) drug therapy: Secondary | ICD-10-CM

## 2020-10-24 LAB — BASIC METABOLIC PANEL
BUN/Creatinine Ratio: 32 — ABNORMAL HIGH (ref 10–24)
BUN: 36 mg/dL — ABNORMAL HIGH (ref 8–27)
CO2: 20 mmol/L (ref 20–29)
Calcium: 8.7 mg/dL (ref 8.6–10.2)
Chloride: 102 mmol/L (ref 96–106)
Creatinine, Ser: 1.14 mg/dL (ref 0.76–1.27)
GFR calc Af Amer: 67 mL/min/{1.73_m2} (ref >59)
GFR calc non Af Amer: 58 mL/min/{1.73_m2} — ABNORMAL LOW (ref >59)
Glucose: 170 mg/dL — ABNORMAL HIGH (ref 65–99)
Potassium: 5.3 mmol/L — ABNORMAL HIGH (ref 3.5–5.2)
Sodium: 136 mmol/L (ref 134–144)

## 2020-10-25 ENCOUNTER — Other Ambulatory Visit: Payer: Medicare Other

## 2020-10-25 MED ORDER — FUROSEMIDE 40 MG PO TABS: 20.0000 mg | ORAL_TABLET | Freq: Every day | ORAL | 3 refills | Status: DC

## 2020-10-25 NOTE — Addendum Note (Signed)
Addended by: Harland German A on: 10/25/2020 10:43 AM   Modules accepted: Orders

## 2020-10-25 NOTE — Telephone Encounter (Signed)
Reviewed results with patient who verbalized understanding.   Called the patient to discuss mildly elevated K and to make sure he is not eating foods high in potassium. Discussed foods to avoid. While on the phone, the patient reported he really needs to restart his lasix has he has swelling in his legs and is weeping.  Reviewed Dr. Oralia Rud 12/20 phone note. With improved kidney function on blood work, instructed the patient to restart Lasix 20 mg daily. Confirmed appointment with Dr. Dennison Bulla on 10/31/20. The patient was grateful for call and agrees with plan.

## 2020-10-26 ENCOUNTER — Other Ambulatory Visit: Payer: Medicare Other

## 2020-10-31 ENCOUNTER — Encounter: Payer: Self-pay | Admitting: Internal Medicine

## 2020-10-31 ENCOUNTER — Other Ambulatory Visit: Payer: Self-pay

## 2020-10-31 ENCOUNTER — Ambulatory Visit: Payer: Medicare Other | Admitting: Internal Medicine

## 2020-10-31 VITALS — BP 148/56 | HR 70 | Ht 68.0 in | Wt 182.6 lb

## 2020-10-31 DIAGNOSIS — I361 Nonrheumatic tricuspid (valve) insufficiency: Secondary | ICD-10-CM

## 2020-10-31 DIAGNOSIS — I5032 Chronic diastolic (congestive) heart failure: Secondary | ICD-10-CM | POA: Diagnosis not present

## 2020-10-31 DIAGNOSIS — I10 Essential (primary) hypertension: Secondary | ICD-10-CM

## 2020-10-31 DIAGNOSIS — I340 Nonrheumatic mitral (valve) insufficiency: Secondary | ICD-10-CM

## 2020-10-31 DIAGNOSIS — Z952 Presence of prosthetic heart valve: Secondary | ICD-10-CM | POA: Diagnosis not present

## 2020-10-31 DIAGNOSIS — I739 Peripheral vascular disease, unspecified: Secondary | ICD-10-CM

## 2020-10-31 DIAGNOSIS — I4811 Longstanding persistent atrial fibrillation: Secondary | ICD-10-CM

## 2020-10-31 DIAGNOSIS — E119 Type 2 diabetes mellitus without complications: Secondary | ICD-10-CM | POA: Diagnosis not present

## 2020-10-31 MED ORDER — FUROSEMIDE 40 MG PO TABS: 40.0000 mg | ORAL_TABLET | Freq: Every day | ORAL | 3 refills | Status: DC

## 2020-10-31 NOTE — Progress Notes (Signed)
Cardiology Office Note:    Date:  10/31/2020   ID:  [name], DOB [birth year], MRN [id number]  PCP:  [name], MD  [facility] Cardiologist:  No primary care provider on file.  [facility] Electrophysiologist:  None   CC:  Follow up Diuresis Leg swelling  History of Present Illness:    [name] is a 86 y.o. male with a hx of Coronary Artery Calcification, severe aortic stenosis s/p transapical TAVR (Edwards 26, Dr. Burt Knack, 2014), HFpEF, Moderate Mitral Regurgitation, Moderate Tricuspid Regurgitation, DM with HTN, persistent atrial fibrillation seen by Dr. Lovena Le, PAD seen by Dr. [name] who presented for evaluation 09/11/20.  Found to have elevated BNP and was started on Farxiga and lasix 40 mg PO Daily- developed AKI.  Has had lasix does reduced and farixga held with improvement of kidney function but with leg swelling.  Patient notes that he is doing worse despite improvement in kidney function.  Since /last visit notes that he felt better on the lasix and SGLT2i, but with stopping the medication his legs have swollen and has more shortness of breath going to the store.  Relevant interval testing or therapy include AKI with lasix and SGLT2i.  There are no interval hospital/ED visit.    No chest pain or pressure .  Notes DOE and worsening leg swelling.     Past Medical History:  Diagnosis Date  . Anemia    LOW PLATELETS OTHER DAY  PER PT  . Anticoagulated on Coumadin 12/21/2012  . Aortic stenosis   . Arthritis    "left wrist; back sometimes" (01/21/2013)  . Atrial fibrillation ([place])    [name], DR COOPER  . Bradycardia   . Exertional shortness of breath   . Heart murmur    "I've had it for years; runs in the family on daddy's side" (01/21/2013)  . HTN (hypertension)   . Hyperlipemia 12/21/2012  . Prostate cancer [facility])    "had 40 tx of radiation in 2009" (01/21/2013)  . S/P aortic valve replacement with bioprosthetic valve 01/19/2013   Transcatheter  Aortic Valve Replacement using 28mm Sapien bioprosthetic tissue valve via transapical approach  . Type II diabetes mellitus ([place])     Past Surgical History:  Procedure Laterality Date  . BALLOON DILATION N/A 08/02/2020   Procedure: BALLOON DILATION;  Surgeon: [name], MD;  Location: [facility];  Service: Gastroenterology;  Laterality: N/A;  . CARDIAC CATHETERIZATION  12/16/2012  . CARDIAC VALVE REPLACEMENT  01/19/2013   AVR  . CATARACT EXTRACTION W/ INTRAOCULAR LENS  IMPLANT, BILATERAL    . ESOPHAGOGASTRODUODENOSCOPY (EGD) WITH PROPOFOL N/A 08/02/2020   Procedure: ESOPHAGOGASTRODUODENOSCOPY (EGD) WITH PROPOFOL;  Surgeon: [name], MD;  Location: [facility];  Service: Gastroenterology;  Laterality: N/A;  . EYE SURGERY    . INTRAOPERATIVE TRANSESOPHAGEAL ECHOCARDIOGRAM N/A 01/19/2013   Procedure: INTRAOPERATIVE TRANSESOPHAGEAL ECHOCARDIOGRAM;  Surgeon: [name], MD;  Location: [place];  Service: Open Heart Surgery;  Laterality: N/A;  . ORIF FOREARM FRACTURE Left 1954   "compound fx" (01/21/2013)  . TRANSTHORACIC ECHOCARDIOGRAM  09/04/10, 09/07/08    Current Medications: Current Meds  Medication Sig  . ACCU-CHEK AVIVA PLUS test strip USE TO CHECK BLOOD SUGARS 1 TO 2 TIMES A DAY  . ACCU-CHEK SOFTCLIX LANCETS lancets daily. use as directed  . amLODipine (NORVASC) 5 MG tablet Take 5 mg by mouth daily.   . Ascorbic Acid (VITAMIN C) 1000 MG tablet Take 1,000 mg by mouth 2 (two) times daily.  Marland Kitchen  B Complex Vitamins (B COMPLEX 50) TABS Take 1 tablet by mouth daily.   . carvedilol (COREG) 12.5 MG tablet Take 1 tablet by mouth twice daily  . cholecalciferol (VITAMIN D) 1000 UNITS tablet Take 1,000 Units by mouth 2 (two) times daily.  . dapagliflozin propanediol (FARXIGA) 10 MG TABS tablet Take 1 tablet (10 mg total) by mouth daily before breakfast.  . doxazosin (CARDURA) 8 MG tablet Take 8 mg by mouth daily.  . ferrous sulfate 325 (65 FE) MG tablet Take 325 mg by mouth daily.  .  furosemide (LASIX) 40 MG tablet Take 1 tablet (40 mg total) by mouth daily.  Marland Kitchen glipiZIDE (GLUCOTROL) 5 MG tablet Take 5 mg by mouth 2 (two) times daily before a meal.  . ketoconazole (NIZORAL) 2 % cream APPLY TO BACK SIDE TWICE DAILY AS NEEDED  . lisinopril (ZESTRIL) 20 MG tablet Take 1 tablet by mouth once daily  . metFORMIN (GLUCOPHAGE) 1000 MG tablet Take 1,000 mg by mouth 2 (two) times daily with a meal.  . Omega 3-6-9 Fatty Acids (OMEGA-3-6-9 PO) Take by mouth 2 (two) times daily.  . pantoprazole (PROTONIX) 40 MG tablet Take 40 mg by mouth daily.  . rosuvastatin (CRESTOR) 5 MG tablet TAKE 1 TABLET BY MOUTH ONCE DAILY 6 IN THE EVENING (Patient taking differently: Take 5 mg by mouth daily.)  . Selenium 200 MCG CAPS Take 200 mcg by mouth every morning.  . traMADol-acetaminophen (ULTRACET) 37.5-325 MG tablet Take 1 tablet by mouth every 6 (six) hours as needed.  . vitamin A 8000 UNIT capsule Take 8,000 Units by mouth every morning.  . vitamin B-12 (CYANOCOBALAMIN) 1000 MCG tablet Take 1,000 mcg by mouth 2 (two) times daily.  Marland Kitchen warfarin (COUMADIN) 5 MG tablet Take 2.5-5 mg by mouth See admin instructions. Take 2.5 mg by mouth on Monday and Friday and take 5 mg on Tuesday-Thursday, Saturday and Sunday  . zinc gluconate 50 MG tablet Take 50 mg by mouth every morning.  . [DISCONTINUED] furosemide (LASIX) 40 MG tablet Take 0.5 tablets (20 mg total) by mouth daily.     Allergies:   Amoxil [amoxicillin]   Social History   Socioeconomic History  . Marital status: Married    Spouse name: Not on file  . Number of children: Not on file  . Years of education: Not on file  . Highest education level: Not on file  Occupational History  . Not on file  Tobacco Use  . Smoking status: Former Smoker    Packs/day: 2.00    Years: 22.00    Pack years: 44.00    Types: Cigarettes    Quit date: 09/11/1976    Years since quitting: 44.1  . Smokeless tobacco: Never Used  Substance and Sexual Activity  .  Alcohol use: No  . Drug use: No  . Sexual activity: Never  Other Topics Concern  . Not on file  Social History Narrative  . Not on file   Social Determinants of Health   Financial Resource Strain: Not on file  Food Insecurity: Not on file  Transportation Needs: Not on file  Physical Activity: Not on file  Stress: Not on file  Social Connections: Not on file    Family History: The patient's family history includes Bone cancer (age of onset: 62) in his mother; Cancer (age of onset: 34) in his brother; Diabetes in his mother; Heart disease in his father; Melanoma (age of onset: 61) in his mother; Pancreatic cancer (age of onset:  37) in his father; Stroke in his sister.  ROS:   Please see the history of present illness.    All other systems reviewed and are negative.  EKGs/Labs/Other Studies Reviewed:    The following studies were reviewed today:  EKG:   09/11/20 Atrial fibrillation rate 63  Transthoracic Echocardiogram: Date:08/18/20: Results: Moderate eccentric mitral regurgitation without prolapse and Moderate tricuspid regurgitation, mild RV dysfunction, TAVR gradients OK 1. Left ventricular ejection fraction, by estimation, is 55 to 60%. The  left ventricle has normal function. The left ventricle has no regional  wall motion abnormalities. There is moderate left ventricular hypertrophy.  Left ventricular diastolic  parameters are indeterminate.  2. Right ventricular systolic function is mildly reduced. The right  ventricular size is normal. There is severely elevated pulmonary artery  systolic pressure. The estimated right ventricular systolic pressure is  0000000 mmHg.  3. Left atrial size was moderately dilated.  4. Right atrial size was moderately dilated.  5. The mitral valve is abnormal, there is restriction of the posterior  leaflet. Moderate mitral valve regurgitation with PISA ERO 0.25 cm^2. No  evidence of mitral stenosis. Moderate mitral annular  calcification.  6. Tricuspid valve regurgitation is moderate.  7. S/p TAVR with 26 mm Edwards Sapien bioprosthesis. Mean gradient 8  mmHg, no evidence for significant stenosis. No significant peri-valvular  leakage noted.  8. The inferior vena cava is dilated in size with >50% respiratory  variability, suggesting right atrial pressure of 8 mmHg.  9. The patient was in atrial fibrillation.   Recent Labs: 09/11/2020: Magnesium 2.2; NT-Pro BNP 3,069 10/24/2020: BUN 36; Creatinine, Ser 1.14; Potassium 5.3; Sodium 136  Recent Lipid Panel    Component Value Date/Time   CHOL 81 (L) 08/17/2018 0748   TRIG 82 08/17/2018 0748   HDL 28 (L) 08/17/2018 0748   CHOLHDL 2.9 08/17/2018 0748   LDLCALC 37 08/17/2018 0748   Echo 08/18/20: Personally reviewed  Risk Assessment/Calculations:     CHA2DS2-VASc Score = 5  This indicates a 7.2% annual risk of stroke. The patient's score is based upon: CHF History: No HTN History: Yes Diabetes History: Yes Stroke History: No Vascular Disease History: Yes Age Score: 2 Gender Score: 0      Physical Exam:    VS:  BP (!) 148/56   Pulse 70   Ht 5\' 8"  (1.727 m)   Wt 182 lb 9.6 oz (82.8 kg)   SpO2 98%   BMI 27.76 kg/m     Wt Readings from Last 3 Encounters:  10/31/20 182 lb 9.6 oz (82.8 kg)  09/11/20 177 lb (80.3 kg)  08/02/20 173 lb (78.5 kg)    [id number] male, well developed in no acute distress HEENT: Bilateral Frank's Sign NECK: No JVD LYMPHATICS: No lymphadenopathy CARDIAC: irregular irregular, holosystolic II/VI murmur, with no rubs, gallops RESPIRATORY:  Clear to auscultation without rales, wheezing or rhonchi  ABDOMEN: Soft, non-tender, non-distended MUSCULOSKELETAL:  +2 edema with small ulcer on right leg (well healed)  SKIN: Warm no purulence from leg NEUROLOGIC:  Alert and oriented x 3 PSYCHIATRIC:  Normal affect   ASSESSMENT:    1. Chronic heart failure with preserved ejection fraction ([place])   2. Diabetes mellitus  with coincident hypertension ([place])   3. Longstanding persistent atrial fibrillation ([place])   4. S/P TAVR (transcatheter aortic valve replacement)   5. PAD (peripheral artery disease) ([place])   6. Nonrheumatic mitral valve regurgitation   7. Nonrheumatic tricuspid valve regurgitation    PLAN:  Moderate Mitral Regurgitation Moderate Tricuspid Regurgitation Severe AS s/p TAVR HFpEF Diabetes with HTN Without frequent UTI but with prostate cancer; no prior DKA, and PAD - NYHA class I, Stage B,  etiology likely atrial functional MR - Diuretic regimen: Lasix increase to 40 mg Po daily - Strict I/Os, daily weights, and fluid restriction of < 2 L  - will address K based on f/u labs - BMP/BNP/Mg in 7-10 days - Coreg at 12.5 mg PO BID - on lisinopril 20 mg Po daily - SGLT2i returned (10 mg PO daily) - Will need echo in 4 months - continue compression stockings  Long standing, persistent atrial fibrillation on coumadin seen by Dr. Lovena Le - BB as above, AC per EP and Phamacy   Peripheral Arterial Disease seen by Dr. [name] - asymptomatic - on warfarin because of above - Continue statin considering increase at next visit - Continue HTN regimen - No Tobacco Use   One month follow up unless new symptoms or abnormal test results warranting change in plan  Would be reasonable for  APP Follow up   Shared Decision Making/Informed Consent    Patient amenable to plan  Medication Adjustments/Labs and Tests Ordered: Current medicines are reviewed at length with the patient today.  Concerns regarding medicines are outlined above.  Orders Placed This Encounter  Procedures  . Basic metabolic panel  . Pro b natriuretic peptide (BNP)  . Magnesium   Meds ordered this encounter  Medications  . furosemide (LASIX) 40 MG tablet    Sig: Take 1 tablet (40 mg total) by mouth daily.    Dispense:  90 tablet    Refill:  3    Patient Instructions  Medication Instructions:  Your physician has  recommended you make the following change in your medication:  1.  INCREASE the  Lasix 40 mg taking 1 whole tablet daily 2.  RESTART  Farxiga 10 mg taking 1 daily   *If you need a refill on your cardiac medications before your next appointment, please call your pharmacy*   Lab Work: 7-10 days:  BMET, PRO BNP, & MAGNESIUM  If you have labs (blood work) drawn today and your tests are completely normal, you will receive your results only by: Marland Kitchen MyChart Message (if you have MyChart) OR . A paper copy in the mail If you have any lab test that is abnormal or we need to change your treatment, we will call you to review the results.   Testing/Procedures: None ordered   Follow-Up: At [facility], you and your health needs are our priority.  As part of our continuing mission to provide you with exceptional heart care, we have created designated Provider Care Teams.  These Care Teams include your primary Cardiologist (physician) and Advanced Practice Providers (APPs -  Physician Assistants and Nurse Practitioners) who all work together to provide you with the care you need, when you need it.  We recommend signing up for the patient portal called "MyChart".  Sign up information is provided on this After Visit Summary.  MyChart is used to connect with patients for Virtual Visits (Telemedicine).  Patients are able to view lab/test results, encounter notes, upcoming appointments, etc.  Non-urgent messages can be sent to your provider as well.   To learn more about what you can do with MyChart, go to [email or web address].    Your next appointment:   1 month(s)  The format for your next appointment:   In Person  Provider:   Rudean Haskell,  MD   Other Instructions      Signed, Christell Constant, MD  10/31/2020 8:32 AM    [facility]

## 2020-10-31 NOTE — Patient Instructions (Addendum)
Medication Instructions:  Your physician has recommended you make the following change in your medication:  1.  INCREASE the  Lasix 40 mg taking 1 whole tablet daily 2.  RESTART  Farxiga 10 mg taking 1 daily   *If you need a refill on your cardiac medications before your next appointment, please call your pharmacy*   Lab Work: 7-10 days:  BMET, PRO BNP, & MAGNESIUM  If you have labs (blood work) drawn today and your tests are completely normal, you will receive your results only by: Marland Kitchen MyChart Message (if you have MyChart) OR . A paper copy in the mail If you have any lab test that is abnormal or we need to change your treatment, we will call you to review the results.   Testing/Procedures: None ordered   Follow-Up: At [facility], you and your health needs are our priority.  As part of our continuing mission to provide you with exceptional heart care, we have created designated Provider Care Teams.  These Care Teams include your primary Cardiologist (physician) and Advanced Practice Providers (APPs -  Physician Assistants and Nurse Practitioners) who all work together to provide you with the care you need, when you need it.  We recommend signing up for the patient portal called "MyChart".  Sign up information is provided on this After Visit Summary.  MyChart is used to connect with patients for Virtual Visits (Telemedicine).  Patients are able to view lab/test results, encounter notes, upcoming appointments, etc.  Non-urgent messages can be sent to your provider as well.   To learn more about what you can do with MyChart, go to [email or web address].    Your next appointment:   1 month(s)  The format for your next appointment:   In Person  Provider:   Riley Lam, MD   Other Instructions

## 2020-11-07 ENCOUNTER — Other Ambulatory Visit: Payer: Medicare Other | Admitting: *Deleted

## 2020-11-07 ENCOUNTER — Other Ambulatory Visit: Payer: Self-pay

## 2020-11-07 DIAGNOSIS — I10 Essential (primary) hypertension: Secondary | ICD-10-CM | POA: Diagnosis not present

## 2020-11-07 DIAGNOSIS — E119 Type 2 diabetes mellitus without complications: Secondary | ICD-10-CM | POA: Diagnosis not present

## 2020-11-07 DIAGNOSIS — I5032 Chronic diastolic (congestive) heart failure: Secondary | ICD-10-CM

## 2020-11-07 LAB — BASIC METABOLIC PANEL
BUN/Creatinine Ratio: 30 — ABNORMAL HIGH (ref 10–24)
BUN: 42 mg/dL — ABNORMAL HIGH (ref 8–27)
CO2: 22 mmol/L (ref 20–29)
Calcium: 8.7 mg/dL (ref 8.6–10.2)
Chloride: 96 mmol/L (ref 96–106)
Creatinine, Ser: 1.39 mg/dL — ABNORMAL HIGH (ref 0.76–1.27)
GFR calc Af Amer: 53 mL/min/{1.73_m2} — ABNORMAL LOW (ref >59)
GFR calc non Af Amer: 46 mL/min/{1.73_m2} — ABNORMAL LOW (ref >59)
Glucose: 208 mg/dL — ABNORMAL HIGH (ref 65–99)
Potassium: 4.5 mmol/L (ref 3.5–5.2)
Sodium: 135 mmol/L (ref 134–144)

## 2020-11-07 LAB — PRO B NATRIURETIC PEPTIDE: NT-Pro BNP: 2791 pg/mL — ABNORMAL HIGH (ref 0–486)

## 2020-11-07 LAB — MAGNESIUM: Magnesium: 2 mg/dL (ref 1.6–2.3)

## 2020-11-08 ENCOUNTER — Telehealth: Payer: Self-pay

## 2020-11-08 DIAGNOSIS — Z79899 Other long term (current) drug therapy: Secondary | ICD-10-CM

## 2020-11-08 NOTE — Telephone Encounter (Signed)
Left a message with the patients spouse for the patient to give [place] a call back whenever he returns home.

## 2020-11-08 NOTE — Telephone Encounter (Signed)
Patient returning call.

## 2020-11-08 NOTE — Telephone Encounter (Signed)
-----   Message from Mahesh A Chandrasekhar, MD sent at 11/07/2020  5:03 PM EST ----- Results: As we discussed creatinine increased with resumption of fluid pill, but improvement in the fluid status. Plan: Will repeat BNP, BMP, and Mg  in 10-14 days; continue medications  Mahesh A Chandrasekhar, MD  

## 2020-11-08 NOTE — Telephone Encounter (Signed)
Patient aware of his results, lab appt made for 11/22/20. Orders placed.

## 2020-11-09 ENCOUNTER — Telehealth: Payer: Self-pay

## 2020-11-09 DIAGNOSIS — I5032 Chronic diastolic (congestive) heart failure: Secondary | ICD-10-CM

## 2020-11-09 DIAGNOSIS — R7989 Other specified abnormal findings of blood chemistry: Secondary | ICD-10-CM

## 2020-11-09 DIAGNOSIS — I739 Peripheral vascular disease, unspecified: Secondary | ICD-10-CM

## 2020-11-09 DIAGNOSIS — Z79899 Other long term (current) drug therapy: Secondary | ICD-10-CM

## 2020-11-09 DIAGNOSIS — E875 Hyperkalemia: Secondary | ICD-10-CM

## 2020-11-09 NOTE — Telephone Encounter (Signed)
LVM for return call to discuss lab results.

## 2020-11-09 NOTE — Telephone Encounter (Signed)
-----   Message from Christell Constant, MD sent at 11/07/2020  5:03 PM EST ----- Results: As we discussed creatinine increased with resumption of fluid pill, but improvement in the fluid status. Plan: Will repeat BNP, BMP, and Mg  in 10-14 days; continue medications  Christell Constant, MD

## 2020-11-13 NOTE — Telephone Encounter (Signed)
Pt advised his lab results and will have his repeat labs drawn the same day as his carotid on 11/20/20.  Orders placed for the [facility] office to draw.

## 2020-11-13 NOTE — Telephone Encounter (Signed)
Patient is returning 11/13/20 call to discuss lab results and plan of care.

## 2020-11-20 ENCOUNTER — Ambulatory Visit (HOSPITAL_COMMUNITY): Admission: RE | Admit: 2020-11-20 | Payer: Medicare Other | Source: Ambulatory Visit

## 2020-11-20 NOTE — Telephone Encounter (Signed)
Carotid doppler has been rescheduled to 11/23/19 due to weather.  I spoke with patient.  He is aware of this appointment and is aware to notify staff he needs lab work done also

## 2020-11-22 ENCOUNTER — Ambulatory Visit (HOSPITAL_COMMUNITY)
Admission: RE | Admit: 2020-11-22 | Discharge: 2020-11-22 | Disposition: A | Discharge status: Home / Self Care | Payer: Medicare Other | Source: Ambulatory Visit | Attending: Internal Medicine | Admitting: Internal Medicine

## 2020-11-22 ENCOUNTER — Other Ambulatory Visit: Payer: Medicare Other

## 2020-11-22 ENCOUNTER — Other Ambulatory Visit: Payer: Self-pay

## 2020-11-22 ENCOUNTER — Other Ambulatory Visit (HOSPITAL_COMMUNITY): Payer: Self-pay | Admitting: Cardiovascular Disease

## 2020-11-22 DIAGNOSIS — R0989 Other specified symptoms and signs involving the circulatory and respiratory systems: Secondary | ICD-10-CM | POA: Diagnosis not present

## 2020-11-22 DIAGNOSIS — R7989 Other specified abnormal findings of blood chemistry: Secondary | ICD-10-CM | POA: Diagnosis not present

## 2020-11-22 DIAGNOSIS — I6523 Occlusion and stenosis of bilateral carotid arteries: Secondary | ICD-10-CM

## 2020-11-22 DIAGNOSIS — Z79899 Other long term (current) drug therapy: Secondary | ICD-10-CM | POA: Diagnosis not present

## 2020-11-22 DIAGNOSIS — E875 Hyperkalemia: Secondary | ICD-10-CM | POA: Diagnosis not present

## 2020-11-22 DIAGNOSIS — I739 Peripheral vascular disease, unspecified: Secondary | ICD-10-CM | POA: Diagnosis not present

## 2020-11-22 DIAGNOSIS — I5032 Chronic diastolic (congestive) heart failure: Secondary | ICD-10-CM | POA: Diagnosis not present

## 2020-11-23 ENCOUNTER — Telehealth: Payer: Self-pay | Admitting: *Deleted

## 2020-11-23 LAB — BASIC METABOLIC PANEL
BUN/Creatinine Ratio: 35 — ABNORMAL HIGH (ref 10–24)
BUN: 41 mg/dL — ABNORMAL HIGH (ref 8–27)
CO2: 23 mmol/L (ref 20–29)
Calcium: 9.3 mg/dL (ref 8.6–10.2)
Chloride: 102 mmol/L (ref 96–106)
Creatinine, Ser: 1.18 mg/dL (ref 0.76–1.27)
GFR calc Af Amer: 65 mL/min/{1.73_m2} (ref >59)
GFR calc non Af Amer: 56 mL/min/{1.73_m2} — ABNORMAL LOW (ref >59)
Glucose: 141 mg/dL — ABNORMAL HIGH (ref 65–99)
Potassium: 4.6 mmol/L (ref 3.5–5.2)
Sodium: 140 mmol/L (ref 134–144)

## 2020-11-23 LAB — MAGNESIUM: Magnesium: 2.1 mg/dL (ref 1.6–2.3)

## 2020-11-23 LAB — PRO B NATRIURETIC PEPTIDE: NT-Pro BNP: 3129 pg/mL — ABNORMAL HIGH (ref 0–486)

## 2020-11-23 NOTE — Telephone Encounter (Signed)
Patient calling back - states someone may be calling me about my labs.  RN   Gave information patient states he would take Lasix. Due kidney  Rn informed patient will send to nurse who called to discuss

## 2020-11-23 NOTE — Telephone Encounter (Signed)
[name], Aaron A, MD  P Cv Div Ch St Triage Results:  Improvement in BG, creatinine, and K with stable Magnesium  Plan:  If LE edema is improving, continue current plan.  If no improvement in symptoms will need to increase lasix to 40 mg BID; and check BMP, Mg in 7-10 days   [name], MD   Patient aware of results written above. Patient stated his LE edema has improved, so he will continue with current plan.

## 2020-11-24 ENCOUNTER — Encounter: Payer: Self-pay | Admitting: Podiatry

## 2020-11-24 ENCOUNTER — Other Ambulatory Visit: Payer: Self-pay

## 2020-11-24 ENCOUNTER — Ambulatory Visit: Payer: Medicare Other | Admitting: Podiatry

## 2020-11-24 DIAGNOSIS — E1142 Type 2 diabetes mellitus with diabetic polyneuropathy: Secondary | ICD-10-CM

## 2020-11-24 DIAGNOSIS — B351 Tinea unguium: Secondary | ICD-10-CM

## 2020-11-24 DIAGNOSIS — M79674 Pain in right toe(s): Secondary | ICD-10-CM

## 2020-11-24 DIAGNOSIS — M79675 Pain in left toe(s): Secondary | ICD-10-CM

## 2020-11-24 DIAGNOSIS — L600 Ingrowing nail: Secondary | ICD-10-CM

## 2020-11-24 NOTE — Progress Notes (Signed)
Subjective:  Patient ID: [name], male    DOB: [birth year],  MRN: [id number]  87 y.o. male presents with preventative diabetic foot care and painful thick toenails that are difficult to trim. Pain interferes with ambulation. Aggravating factors include wearing enclosed shoe gear. Pain is relieved with periodic professional debridement.   He states his blood glucose was 145 mg/dl this morning.  [name] feels his right great toe is a little sore. He missed an appointment due to having a schedule conflict.  Review of Systems: Negative except as noted in the HPI.  Past Medical History:  Diagnosis Date   Anemia    LOW PLATELETS OTHER DAY  PER PT   Anticoagulated on Coumadin 12/21/2012   Aortic stenosis    Arthritis    "left wrist; back sometimes" (01/21/2013)   Atrial fibrillation ([name])    [name], [name]   Bradycardia    Exertional shortness of breath    Heart murmur    "I've had it for years; runs in the family on daddy's side" (01/21/2013)   HTN (hypertension)    Hyperlipemia 12/21/2012   Prostate cancer ([place])    "had 40 tx of radiation in 2009" (01/21/2013)   S/P aortic valve replacement with bioprosthetic valve 01/19/2013   Transcatheter Aortic Valve Replacement using 69mm Sapien bioprosthetic tissue valve via transapical approach   Type II diabetes mellitus ([place])    Past Surgical History:  Procedure Laterality Date   BALLOON DILATION N/A 08/02/2020   Procedure: BALLOON DILATION;  Surgeon: [name], MD;  Location: [facility];  Service: Gastroenterology;  Laterality: N/A;   CARDIAC CATHETERIZATION  12/16/2012   CARDIAC VALVE REPLACEMENT  01/19/2013   AVR   CATARACT EXTRACTION W/ INTRAOCULAR LENS  IMPLANT, BILATERAL     ESOPHAGOGASTRODUODENOSCOPY (EGD) WITH PROPOFOL N/A 08/02/2020   Procedure: ESOPHAGOGASTRODUODENOSCOPY (EGD) WITH PROPOFOL;  Surgeon: [name], MD;  Location: [facility];  Service: Gastroenterology;  Laterality: N/A;   EYE  SURGERY     INTRAOPERATIVE TRANSESOPHAGEAL ECHOCARDIOGRAM N/A 01/19/2013   Procedure: INTRAOPERATIVE TRANSESOPHAGEAL ECHOCARDIOGRAM;  Surgeon: [name], MD;  Location: [place];  Service: Open Heart Surgery;  Laterality: N/A;   ORIF FOREARM FRACTURE Left 1954   "compound fx" (01/21/2013)   TRANSTHORACIC ECHOCARDIOGRAM  09/04/10, 09/07/08   Patient Active Problem List   Diagnosis Date Noted   Nonrheumatic mitral valve regurgitation 09/11/2020   Nonrheumatic tricuspid valve regurgitation 09/11/2020   PAD (peripheral artery disease) ([place]) 08/22/2017   Absolute anemia 04/02/2016   HLD (hyperlipidemia) 04/02/2016   Diabetes mellitus with coincident hypertension ([place]) 04/02/2016   Long term current use of anticoagulant 04/02/2016   Macrocytosis 04/02/2016   Arthritis, degenerative 04/02/2016   History of colon polyps 04/02/2016   Carcinoma of prostate ([place]) 04/02/2016   Abdominal discomfort, epigastric 12/20/2015   H/O adenomatous polyp of colon 12/20/2015   Left upper quadrant pain 03/16/2015   Chronic heart failure with preserved ejection fraction ([place]) 08/30/2014   S/P TAVR (transcatheter aortic valve replacement) 02/01/2013   S/P aortic valve replacement with bioprosthetic valve 01/19/2013   Diabetes mellitus ([place]) 12/21/2012   Hyperlipemia 12/21/2012   H/O prostate cancer 12/21/2012   Anticoagulated on Coumadin 12/21/2012   HYPERTENSION, CONTROLLED 09/06/2009   AORTIC STENOSIS 09/06/2009   ATRIAL FIBRILLATION, CHRONIC 09/06/2009    Current Outpatient Medications:    ACCU-CHEK AVIVA PLUS test strip, USE TO CHECK BLOOD SUGARS 1 TO 2 TIMES A DAY, Disp: , Rfl: 3   ACCU-CHEK SOFTCLIX LANCETS lancets, daily.  use as directed, Disp: , Rfl: 3   amLODipine (NORVASC) 5 MG tablet, Take 5 mg by mouth daily. , Disp: , Rfl:    Ascorbic Acid (VITAMIN C) 1000 MG tablet, Take 1,000 mg by mouth 2 (two) times daily., Disp: , Rfl:    B Complex Vitamins (B COMPLEX  50) TABS, Take 1 tablet by mouth daily. , Disp: , Rfl:    carvedilol (COREG) 12.5 MG tablet, Take 1 tablet by mouth twice daily, Disp: 180 tablet, Rfl: 2   cholecalciferol (VITAMIN D) 1000 UNITS tablet, Take 1,000 Units by mouth 2 (two) times daily., Disp: , Rfl:    dapagliflozin propanediol (FARXIGA) 10 MG TABS tablet, Take 1 tablet (10 mg total) by mouth daily before breakfast., Disp: 30 tablet, Rfl: 2   doxazosin (CARDURA) 8 MG tablet, Take 8 mg by mouth daily., Disp: , Rfl:    ferrous sulfate 325 (65 FE) MG tablet, Take 325 mg by mouth daily., Disp: , Rfl:    furosemide (LASIX) 40 MG tablet, Take 1 tablet (40 mg total) by mouth daily., Disp: 90 tablet, Rfl: 3   glipiZIDE (GLUCOTROL) 5 MG tablet, Take 5 mg by mouth 2 (two) times daily before a meal., Disp: , Rfl:    ketoconazole (NIZORAL) 2 % cream, APPLY TO BACK SIDE TWICE DAILY AS NEEDED, Disp: , Rfl: 3   lisinopril (ZESTRIL) 20 MG tablet, Take 1 tablet by mouth once daily, Disp: 90 tablet, Rfl: 3   metFORMIN (GLUCOPHAGE) 1000 MG tablet, Take 1,000 mg by mouth 2 (two) times daily with a meal., Disp: , Rfl:    Omega 3-6-9 Fatty Acids (OMEGA-3-6-9 PO), Take by mouth 2 (two) times daily., Disp: , Rfl:    pantoprazole (PROTONIX) 40 MG tablet, Take 40 mg by mouth daily., Disp: , Rfl:    rosuvastatin (CRESTOR) 5 MG tablet, TAKE 1 TABLET BY MOUTH ONCE DAILY 6 IN THE EVENING (Patient taking differently: Take 5 mg by mouth daily.), Disp: 90 tablet, Rfl: 3   Selenium 200 MCG CAPS, Take 200 mcg by mouth every morning., Disp: , Rfl:    traMADol-acetaminophen (ULTRACET) 37.5-325 MG tablet, Take 1 tablet by mouth every 6 (six) hours as needed., Disp: , Rfl:    vitamin A 8000 UNIT capsule, Take 8,000 Units by mouth every morning., Disp: , Rfl:    vitamin B-12 (CYANOCOBALAMIN) 1000 MCG tablet, Take 1,000 mcg by mouth 2 (two) times daily., Disp: , Rfl:    warfarin (COUMADIN) 5 MG tablet, Take 2.5-5 mg by mouth See admin instructions. Take 2.5  mg by mouth on Monday and Friday and take 5 mg on Tuesday-Thursday, Saturday and Sunday, Disp: , Rfl:    zinc gluconate 50 MG tablet, Take 50 mg by mouth every morning., Disp: , Rfl:  No current facility-administered medications for this visit.  Facility-Administered Medications Ordered in Other Visits:    norepinephrine (LEVOPHED) 8 mg in dextrose 5 % 250 mL infusion, 2-50 mcg/min, Intravenous, Titrated, [name], MD Allergies  Allergen Reactions   Amoxil [Amoxicillin]     Burning of mouth, tongue and lips.   Social History   Tobacco Use  Smoking Status Former Smoker   Packs/day: 2.00   Years: 22.00   Pack years: 44.00   Types: Cigarettes   Quit date: 09/11/1976   Years since quitting: 44.2  Smokeless Tobacco Never Used   Objective:  There were no vitals filed for this visit. Constitutional Patient is a pleasant 87 y.o. Caucasian male in NAD.Marland Kitchen AAO  x 3.  Vascular Neurovascular status unchanged b/l lower extremities. Palpable DP pulse(s) b/l lower extremities Palpable PT pulse(s) b/l lower extremities Pedal hair absent. Lower extremity skin temperature gradient within normal limits. No pain with calf compression b/l. No edema noted b/l lower extremities. No cyanosis or clubbing noted.  Neurologic Normal speech. Oriented to person, place, and time. Protective sensation intact 5/5 intact bilaterally with 10g monofilament b/l. Vibratory sensation intact b/l.  Dermatologic Pedal skin with normal turgor, texture and tone bilaterally. No open wounds bilaterally. No interdigital macerations bilaterally. Toenails 1-5 b/l elongated, discolored, dystrophic, thickened, crumbly with subungual debris and tenderness to dorsal palpation. Incurvated nailplate medial and lateral border(s) L hallux.  Nail border hypertrophy mild. There is tenderness to palpation. Sign(s) of infection: no clinical signs of infection noted on examination today.  Old subungual hematoma noted of the left 2nd  digit. Appears to be subacute with dark heme under the toenail. Nailplate remains adhered. No erythema, no edema, no active drainage, no flocculence, no fluid expressed when pressure applied to digit.  Orthopedic: Normal muscle strength 5/5 to all lower extremity muscle groups bilaterally. No pain crepitus or joint limitation noted with ROM b/l. No gross bony deformities bilaterally. Utilizes cane for ambulation assistance.    Assessment:   1. Pain due to onychomycosis of toenails of both feet   2. Ingrown toenail without infection   3. Diabetic peripheral neuropathy associated with type 2 diabetes mellitus ([place])    Plan:  Patient was evaluated and treated and all questions answered.  Onychomycosis with pain -Nails palliatively debridement as below. -Educated on self-care  Procedure: Nail Debridement Rationale: Pain Type of Debridement: manual, sharp debridement. Instrumentation: Nail nipper, rotary burr. Number of Nails: 10  -Examined patient. -Continue diabetic foot care principles. -Toenails 1-5 b/l were debrided in length and girth with sterile nail nippers and dremel without iatrogenic bleeding.  -Offending nail border debrided and curretaged L hallux and R hallux utilizing sterile nail nipper and currette. Border(s) cleansed with alcohol and triple antibiotic ointment applied applied.  No further treatment required by patient. -Patient to report any pedal injuries to medical professional immediately. -Patient/POA to call should there be question/concern in the interim.  Return in about 3 months (around 02/22/2021).  Marzetta Board, DPM

## 2020-11-27 DIAGNOSIS — Z7901 Long term (current) use of anticoagulants: Secondary | ICD-10-CM | POA: Diagnosis not present

## 2020-12-01 ENCOUNTER — Encounter (INDEPENDENT_AMBULATORY_CARE_PROVIDER_SITE_OTHER): Payer: Self-pay

## 2020-12-01 ENCOUNTER — Encounter: Payer: Self-pay | Admitting: Internal Medicine

## 2020-12-01 ENCOUNTER — Ambulatory Visit: Payer: Medicare Other | Admitting: Internal Medicine

## 2020-12-01 ENCOUNTER — Other Ambulatory Visit: Payer: Self-pay

## 2020-12-01 VITALS — BP 140/68 | HR 90 | Ht 68.5 in | Wt 173.0 lb

## 2020-12-01 DIAGNOSIS — E119 Type 2 diabetes mellitus without complications: Secondary | ICD-10-CM | POA: Diagnosis not present

## 2020-12-01 DIAGNOSIS — I340 Nonrheumatic mitral (valve) insufficiency: Secondary | ICD-10-CM

## 2020-12-01 DIAGNOSIS — I10 Essential (primary) hypertension: Secondary | ICD-10-CM | POA: Diagnosis not present

## 2020-12-01 DIAGNOSIS — I5032 Chronic diastolic (congestive) heart failure: Secondary | ICD-10-CM | POA: Diagnosis not present

## 2020-12-01 DIAGNOSIS — I361 Nonrheumatic tricuspid (valve) insufficiency: Secondary | ICD-10-CM

## 2020-12-01 DIAGNOSIS — Z953 Presence of xenogenic heart valve: Secondary | ICD-10-CM | POA: Diagnosis not present

## 2020-12-01 DIAGNOSIS — I739 Peripheral vascular disease, unspecified: Secondary | ICD-10-CM | POA: Diagnosis not present

## 2020-12-01 MED ORDER — CLINDAMYCIN HCL 300 MG PO CAPS: 600.0000 mg | ORAL_CAPSULE | ORAL | 0 refills | Status: DC | PRN

## 2020-12-01 NOTE — Patient Instructions (Signed)
Medication Instructions:  Your physician recommends that you continue on your current medications as directed. Please refer to the Current Medication list given to you today.  Dr. Gasper Sells has prescribed you to take antibiotics 1 hour prior to dental appointments. CLINDAMYCIN 600mg  by mouth 1 hour prior to dental procedure.  *If you need a refill on your cardiac medications before your next appointment, please call your pharmacy*      Follow-Up:  3 months in the office with Dr. Gasper Sells   Needs appointment with Dr. Lovena Le as soon as possible

## 2020-12-01 NOTE — Progress Notes (Signed)
Cardiology Office Note:    Date:  12/01/2020   ID:  [name], DOB 1933/01/22, MRN [id number]  PCP:  [name], MD  [facility] Cardiologist:  Rudean Haskell MD [place] Electrophysiologist:  None   CC:  Follow up Diuresis Leg swelling  History of Present Illness:    [name] is a 87 y.o. male with a hx of Coronary Artery Calcification, severe aortic stenosis s/p transapical TAVR [name] 28, Dr. Burt Knack, 2014), HFpEF, Moderate Mitral Regurgitation, Moderate Tricuspid Regurgitation, DM with HTN, persistent atrial fibrillation seen by Dr. Lovena Le, PAD seen by Dr. [name] who presented for evaluation 09/11/20.  Found to have elevated BNP and was started on Farxiga and lasix 40 mg PO Daily- developed AKI.  Has had lasix does reduced and farixga held with improvement of kidney function but with leg swelling.  12/20 returned his diuretics.  Saw Dr. [name] and had normal ABIs.  Patient notes that he is having neuropathy.  Sinc[facility]ast visit notes improvement in your  changes.  Relevant interval testing or therapy include 11/22/20 BMP with improvement in leg swelling.  There are no interval hospital/ED visit.  Notes that he eats a lot of food out of a box and is unable to prepare food like he and his wife used to.  No chest pain or pressure .  No SOB/DOE and no PND/Orthopnea.  No weight gain  And improvement in leg swelling.  No palpitations or syncope .   Past Medical History:  Diagnosis Date  . Anemia    LOW PLATELETS OTHER DAY  PER PT  . Anticoagulated on Coumadin 12/21/2012  . Aortic stenosis   . Arthritis    "left wrist; back sometimes" (01/21/2013)  . Atrial fibrillation ([place])    [name], DR COOPER  . Bradycardia   . Exertional shortness of breath   . Heart murmur    "I've had it for years; runs in the family on daddy's side" (01/21/2013)  . HTN (hypertension)   . Hyperlipemia 12/21/2012  . Prostate cancer [facility])    "had 40 tx of radiation in 2009"  (01/21/2013)  . S/P aortic valve replacement with bioprosthetic valve 01/19/2013   Transcatheter Aortic Valve Replacement using 67mm Sapien bioprosthetic tissue valve via transapical approach  . Type II diabetes mellitus ([place])     Past Surgical History:  Procedure Laterality Date  . BALLOON DILATION N/A 08/02/2020   Procedure: BALLOON DILATION;  Surgeon: [name], MD;  Location: [facility];  Service: Gastroenterology;  Laterality: N/A;  . CARDIAC CATHETERIZATION  12/16/2012  . CARDIAC VALVE REPLACEMENT  01/19/2013   AVR  . CATARACT EXTRACTION W/ INTRAOCULAR LENS  IMPLANT, BILATERAL    . ESOPHAGOGASTRODUODENOSCOPY (EGD) WITH PROPOFOL N/A 08/02/2020   Procedure: ESOPHAGOGASTRODUODENOSCOPY (EGD) WITH PROPOFOL;  Surgeon: [name], MD;  [facility];  Service: Gastroenterology;  Laterality: N/A;  . EYE SURGERY    . INTRAOPERATIVE TRANSESOPHAGEAL ECHOCARDIOGRAM N/A 01/19/2013   Procedure: INTRAOPERATIVE TRANSESOPHAGEAL ECHOCARDIOGRAM;  Surgeon: [name], MD;  Location: [place];  Service: Open Heart Surgery;  Laterality: N/A;  . ORIF FOREARM FRACTURE Left 1954   "compound fx" (01/21/2013)  . TRANSTHORACIC ECHOCARDIOGRAM  09/04/10, 09/07/08    Current Medications: Current Meds  Medication Sig  . ACCU-CHEK AVIVA PLUS test strip USE TO CHECK BLOOD SUGARS 1 TO 2 TIMES A DAY  . ACCU-CHEK SOFTCLIX LANCETS lancets daily. use as directed  . amLODipine (NORVASC) 5 MG tablet Take 5 mg by mouth daily.   Marland Kitchen  Ascorbic Acid (VITAMIN C) 1000 MG tablet Take 1,000 mg by mouth 2 (two) times daily.  . B Complex Vitamins (B COMPLEX 50) TABS Take 1 tablet by mouth daily.   . carvedilol (COREG) 12.5 MG tablet Take 1 tablet by mouth twice daily  . cholecalciferol (VITAMIN D) 1000 UNITS tablet Take 1,000 Units by mouth 2 (two) times daily.  . clindamycin (CLEOCIN) 300 MG capsule Take 2 capsules (600 mg total) by mouth as needed. Take 1 hour prior to dental work  . dapagliflozin propanediol (FARXIGA)  10 MG TABS tablet Take 1 tablet (10 mg total) by mouth daily before breakfast.  . doxazosin (CARDURA) 8 MG tablet Take 8 mg by mouth daily.  . ferrous sulfate 325 (65 FE) MG tablet Take 325 mg by mouth daily.  . furosemide (LASIX) 40 MG tablet Take 1 tablet (40 mg total) by mouth daily.  Marland Kitchen glipiZIDE (GLUCOTROL) 5 MG tablet Take 5 mg by mouth 2 (two) times daily before a meal.  . ketoconazole (NIZORAL) 2 % cream APPLY TO BACK SIDE TWICE DAILY AS NEEDED  . lisinopril (ZESTRIL) 20 MG tablet Take 1 tablet by mouth once daily  . metFORMIN (GLUCOPHAGE) 1000 MG tablet Take 1,000 mg by mouth 2 (two) times daily with a meal.  . Omega 3-6-9 Fatty Acids (OMEGA-3-6-9 PO) Take by mouth 2 (two) times daily.  . pantoprazole (PROTONIX) 40 MG tablet Take 40 mg by mouth daily.  . rosuvastatin (CRESTOR) 5 MG tablet TAKE 1 TABLET BY MOUTH ONCE DAILY 6 IN THE EVENING  . Selenium 200 MCG CAPS Take 200 mcg by mouth every morning.  . traMADol-acetaminophen (ULTRACET) 37.5-325 MG tablet Take 1 tablet by mouth every 6 (six) hours as needed.  . vitamin A 8000 UNIT capsule Take 8,000 Units by mouth every morning.  . vitamin B-12 (CYANOCOBALAMIN) 1000 MCG tablet Take 1,000 mcg by mouth 2 (two) times daily.  Marland Kitchen warfarin (COUMADIN) 5 MG tablet Take 2.5-5 mg by mouth See admin instructions. Take 2.5 mg by mouth on Monday and Friday and take 5 mg on Tuesday-Thursday, Saturday and Sunday  . zinc gluconate 50 MG tablet Take 50 mg by mouth every morning.     Allergies:   Amoxil [amoxicillin]   Social History   Socioeconomic History  . Marital status: Married    Spouse name: Not on file  . Number of children: Not on file  . Years of education: Not on file  . Highest education level: Not on file  Occupational History  . Not on file  Tobacco Use  . Smoking status: Former Smoker    Packs/day: 2.00    Years: 22.00    Pack years: 44.00    Types: Cigarettes    Quit date: 09/11/1976    Years since quitting: 44.2  .  Smokeless tobacco: Never Used  Substance and Sexual Activity  . Alcohol use: No  . Drug use: No  . Sexual activity: Never  Other Topics Concern  . Not on file  Social History Narrative  . Not on file   Social Determinants of Health   Financial Resource Strain: Not on file  Food Insecurity: Not on file  Transportation Needs: Not on file  Physical Activity: Not on file  Stress: Not on file  Social Connections: Not on file    Family History: The patient's family history includes Bone cancer (age of onset: 71) in his mother; Cancer (age of onset: 45) in his brother; Diabetes in his mother; Heart disease  in his father; Melanoma (age of onset: 48) in his mother; Pancreatic cancer (age of onset: 66) in his father; Stroke in his sister.  ROS:   Please see the history of present illness.    All other systems reviewed and are negative.  EKGs/Labs/Other Studies Reviewed:    The following studies were reviewed today:  EKG:   09/11/20 Atrial fibrillation Rate 63  Transthoracic Echocardiogram: Date:08/18/20: Results: Moderate eccentric mitral regurgitation without prolapse and Moderate tricuspid regurgitation, mild RV dysfunction, TAVR gradients OK 1. Left ventricular ejection fraction, by estimation, is 55 to 60%. The  left ventricle has normal function. The left ventricle has no regional  wall motion abnormalities. There is moderate left ventricular hypertrophy.  Left ventricular diastolic  parameters are indeterminate.  2. Right ventricular systolic function is mildly reduced. The right  ventricular size is normal. There is severely elevated pulmonary artery  systolic pressure. The estimated right ventricular systolic pressure is  10.6 mmHg.  3. Left atrial size was moderately dilated.  4. Right atrial size was moderately dilated.  5. The mitral valve is abnormal, there is restriction of the posterior  leaflet. Moderate mitral valve regurgitation with PISA ERO 0.25 cm^2. No   evidence of mitral stenosis. Moderate mitral annular calcification.  6. Tricuspid valve regurgitation is moderate.  7. S/p TAVR with 26 mm Edwards Sapien bioprosthesis. Mean gradient 8  mmHg, no evidence for significant stenosis. No significant peri-valvular  leakage noted.  8. The inferior vena cava is dilated in size with >50% respiratory  variability, suggesting right atrial pressure of 8 mmHg.  9. The patient was in atrial fibrillation.   Recent Labs: 11/22/2020: BUN 41; Creatinine, Ser 1.18; Magnesium 2.1; NT-Pro BNP 3,129; Potassium 4.6; Sodium 140  Recent Lipid Panel    Component Value Date/Time   CHOL 81 (L) 08/17/2018 0748   TRIG 82 08/17/2018 0748   HDL 28 (L) 08/17/2018 0748   CHOLHDL 2.9 08/17/2018 0748   LDLCALC 37 08/17/2018 0748    Risk Assessment/Calculations:     CHA2DS2-VASc Score = 5  This indicates a 7.2% annual risk of stroke. The patient's score is based upon: CHF History: No HTN History: Yes Diabetes History: Yes Stroke History: No Vascular Disease History: Yes Age Score: 2 Gender Score: 0      Physical Exam:    VS:  BP 140/68   Pulse 90   Ht 5' 8.5" (1.74 m)   Wt 173 lb (78.5 kg)   SpO2 97%   BMI 25.92 kg/m     Wt Readings from Last 3 Encounters:  12/01/20 173 lb (78.5 kg)  10/31/20 182 lb 9.6 oz (82.8 kg)  09/11/20 177 lb (80.3 kg)    YIR:SWNIOEV male, well developed in no acute distress HEENT: Bilateral Frank's Sign NECK: No JVD LYMPHATICS: No lymphadenopathy CARDIAC: irregular irregular, holosystolic II/VI murmur, with no rubs, gallops RESPIRATORY:  Clear to auscultation without rales, wheezing or rhonchi  ABDOMEN: Soft, non-tender, non-distended MUSCULOSKELETAL:  +1 edema bilateral improved from prior SKIN: Warm and dry NEUROLOGIC:  Alert and oriented x 3 PSYCHIATRIC:  Normal affect   ASSESSMENT:    1. Nonrheumatic tricuspid valve regurgitation   2. Nonrheumatic mitral valve regurgitation   3. PAD (peripheral artery  disease) ([place])   4. Chronic heart failure with preserved ejection fraction ([facility])   5. S/P aortic valve replacement with bioprosthetic valve   6. Diabetes mellitus with coincident hypertension ([facility])    PLAN:     Moderate Mitral Regurgitation Moderate Tricuspid  Regurgitation Severe AS s/p TAVR HFpEF Diabetes with HTN - NYHA class I, Stage B,  etiology likely atrial functional MR - Diuretic regimen: Lasix40 mg Po daily - Strict I/Os, daily weights, and fluid restriction of < 2 L  - BMP/BNP/Mg in 7-10 days - Coreg at 12.5 mg PO BID - on lisinopril 20 mg Po daily - SGLT2i at 10 mg PO daily - Will need echo in 3 months - Will give Clindamycin 600 mg PO PRN dental procedure (1 hr prior) - continue compression stockings  Long standing, persistent atrial fibrillation on coumadin seen by Dr. Lovena Le - BB as above, AC per EP and Phamacy  - will have one time follow up visit with Dr. Lovena Le per patients request  Peripheral Arterial Disease seen by Dr. [name] - asymptomatic - on warfarin because of above - Continue statin considering increase at next visit - Continue HTN regimen - No Tobacco Use    follow up unless new symptoms or abnormal test results warranting change in plan  Would be reasonable for  Virtual Follow up Would be reasonable for  APP Follow up   Medication Adjustments/Labs and Tests Ordered: Current medicines are reviewed at length with the patient today.  Concerns regarding medicines are outlined above.  No orders of the defined types were placed in this encounter.  Meds ordered this encounter  Medications  . clindamycin (CLEOCIN) 300 MG capsule    Sig: Take 2 capsules (600 mg total) by mouth as needed. Take 1 hour prior to dental work    Dispense:  30 capsule    Refill:  0    Patient Instructions  Medication Instructions:  Your physician recommends that you continue on your current medications as directed. Please refer to the Current Medication list given to  you today.  Dr. Gasper Sells has prescribed you to take antibiotics 1 hour prior to dental appointments. CLINDAMYCIN 600mg  by mouth 1 hour prior to dental procedure.  *If you need a refill on your cardiac medications before your next appointment, please call your pharmacy*      Follow-Up:  3 months in the office with Dr. Gasper Sells   Needs appointment with Dr. Lovena Le as soon as possible     Signed, [name], MD  12/01/2020 8:44 AM    [place]

## 2020-12-04 DIAGNOSIS — Z7901 Long term (current) use of anticoagulants: Secondary | ICD-10-CM | POA: Diagnosis not present

## 2020-12-07 ENCOUNTER — Other Ambulatory Visit: Payer: Self-pay

## 2020-12-07 ENCOUNTER — Ambulatory Visit: Payer: Medicare Other | Admitting: Internal Medicine

## 2020-12-07 ENCOUNTER — Encounter: Payer: Self-pay | Admitting: Internal Medicine

## 2020-12-07 VITALS — BP 150/60 | HR 91 | Ht 68.0 in | Wt 172.2 lb

## 2020-12-07 DIAGNOSIS — I4811 Longstanding persistent atrial fibrillation: Secondary | ICD-10-CM | POA: Diagnosis not present

## 2020-12-07 DIAGNOSIS — I10 Essential (primary) hypertension: Secondary | ICD-10-CM | POA: Diagnosis not present

## 2020-12-07 NOTE — Patient Instructions (Addendum)
Medication Instructions:  Your physician recommends that you continue on your current medications as directed. Please refer to the Current Medication list given to you today.  Labwork: None ordered.  Testing/Procedures: None ordered.  Follow-Up: Your physician wants you to follow-up in: as needed with Dr. Taylor.      Any Other Special Instructions Will Be Listed Below (If Applicable).  If you need a refill on your cardiac medications before your next appointment, please call your pharmacy.   

## 2020-12-07 NOTE — Progress Notes (Signed)
HPI [name] returns today for ongoing followup of aortic stenosis s/p TAVR, persistent atrial fib, and chronic diastolic heart failure. He has undergone treatment for prostate CA and has difficulty with nocturia.In the interim, he has been seen by Dr. Gasper Sells. No chest pain or sob. Minimal right leg edema. He has some pulmonary HTN. He denies peripheral edema and no syncope.  Allergies  Allergen Reactions  . Amoxil [Amoxicillin]     Burning of mouth, tongue and lips.     Current Outpatient Medications  Medication Sig Dispense Refill  . ACCU-CHEK AVIVA PLUS test strip USE TO CHECK BLOOD SUGARS 1 TO 2 TIMES A DAY  3  . ACCU-CHEK SOFTCLIX LANCETS lancets daily. use as directed  3  . amLODipine (NORVASC) 5 MG tablet Take 5 mg by mouth daily.     . Ascorbic Acid (VITAMIN C) 1000 MG tablet Take 1,000 mg by mouth 2 (two) times daily.    . B Complex Vitamins (B COMPLEX 50) TABS Take 1 tablet by mouth daily.     . carvedilol (COREG) 12.5 MG tablet Take 1 tablet by mouth twice daily 180 tablet 2  . cholecalciferol (VITAMIN D) 1000 UNITS tablet Take 1,000 Units by mouth 2 (two) times daily.    . clindamycin (CLEOCIN) 300 MG capsule Take 2 capsules (600 mg total) by mouth as needed. Take 1 hour prior to dental work 30 capsule 0  . dapagliflozin propanediol (FARXIGA) 10 MG TABS tablet Take 1 tablet (10 mg total) by mouth daily before breakfast. 30 tablet 2  . doxazosin (CARDURA) 8 MG tablet Take 8 mg by mouth daily.    . ferrous sulfate 325 (65 FE) MG tablet Take 325 mg by mouth daily.    . furosemide (LASIX) 40 MG tablet Take 1 tablet (40 mg total) by mouth daily. 90 tablet 3  . glipiZIDE (GLUCOTROL) 5 MG tablet Take 5 mg by mouth 2 (two) times daily before a meal.    . lisinopril (ZESTRIL) 20 MG tablet Take 1 tablet by mouth once daily 90 tablet 3  . metFORMIN (GLUCOPHAGE) 1000 MG tablet Take 1,000 mg by mouth 2 (two) times daily with a meal.    . pantoprazole (PROTONIX) 40 MG  tablet Take 40 mg by mouth daily.    . rosuvastatin (CRESTOR) 5 MG tablet TAKE 1 TABLET BY MOUTH ONCE DAILY 6 IN THE EVENING 90 tablet 3  . Selenium 200 MCG CAPS Take 200 mcg by mouth every morning.    . vitamin A 8000 UNIT capsule Take 8,000 Units by mouth every morning.    . vitamin B-12 (CYANOCOBALAMIN) 1000 MCG tablet Take 1,000 mcg by mouth 2 (two) times daily.    Marland Kitchen warfarin (COUMADIN) 5 MG tablet Take 2.5-5 mg by mouth See admin instructions. Take 2.5 mg by mouth on Monday and Friday and take 5 mg on Tuesday-Thursday, Saturday and Sunday    . zinc gluconate 50 MG tablet Take 50 mg by mouth every morning.     No current facility-administered medications for this visit.   Facility-Administered Medications Ordered in Other Visits  Medication Dose Route Frequency Provider Last Rate Last Admin  . norepinephrine (LEVOPHED) 8 mg in dextrose 5 % 250 mL infusion  2-50 mcg/min Intravenous Titrated [name], MD         Past Medical History:  Diagnosis Date  . Anemia    LOW PLATELETS OTHER DAY  PER PT  . Anticoagulated on Coumadin 12/21/2012  .  Aortic stenosis   . Arthritis    "left wrist; back sometimes" (01/21/2013)  . Atrial fibrillation ([place])    GREG Adesuwa Osgood, DR COOPER  . Bradycardia   . Exertional shortness of breath   . Heart murmur    "I've had it for years; runs in the family on daddy's side" (01/21/2013)  . HTN (hypertension)   . Hyperlipemia 12/21/2012  . Prostate cancer [facility])    "had 40 tx of radiation in 2009" (01/21/2013)  . S/P aortic valve replacement with bioprosthetic valve 01/19/2013   Transcatheter Aortic Valve Replacement using 26mm Sapien bioprosthetic tissue valve via transapical approach  . Type II diabetes mellitus (HCC)     ROS:   All systems reviewed and negative except as noted in the HPI.   Past Surgical History:  Procedure Laterality Date  . BALLOON DILATION N/A 08/02/2020   Procedure: BALLOON DILATION;  Surgeon: [name], MD;  Location: [facility];  Service: Gastroenterology;  Laterality: N/A;  . CARDIAC CATHETERIZATION  12/16/2012  . CARDIAC VALVE REPLACEMENT  01/19/2013   AVR  . CATARACT EXTRACTION W/ INTRAOCULAR LENS  IMPLANT, BILATERAL    . ESOPHAGOGASTRODUODENOSCOPY (EGD) WITH PROPOFOL N/A 08/02/2020   Procedure: ESOPHAGOGASTRODUODENOSCOPY (EGD) WITH PROPOFOL;  Surgeon: [name], MD;  Location: [facility] ENDOSCOPY;  Service: Gastroenterology;  Laterality: N/A;  . EYE SURGERY    . INTRAOPERATIVE TRANSESOPHAGEAL ECHOCARDIOGRAM N/A 01/19/2013   Procedure: INTRAOPERATIVE TRANSESOPHAGEAL ECHOCARDIOGRAM;  Surgeon: [name], MD;  Location: [place];  Service: Open Heart Surgery;  Laterality: N/A;  . ORIF FOREARM FRACTURE Left 1954   "compound fx" (01/21/2013)  . TRANSTHORACIC ECHOCARDIOGRAM  09/04/10, 09/07/08     Family History  Problem Relation Age of Onset  . Diabetes Mother   . Melanoma Mother 64  . Bone cancer Mother 67  . Pancreatic cancer Father 31  . Heart disease Father   . Stroke Sister   . Cancer Brother 20       ? brain cancer     Social History   Socioeconomic History  . Marital status: Married    Spouse name: Not on file  . Number of children: Not on file  . Years of education: Not on file  . Highest education level: Not on file  Occupational History  . Not on file  Tobacco Use  . Smoking status: Former Smoker    Packs/day: 2.00    Years: 22.00    Pack years: 44.00    Types: Cigarettes    Quit date: 09/11/1976    Years since quitting: 44.2  . Smokeless tobacco: Never Used  Substance and Sexual Activity  . Alcohol use: No  . Drug use: No  . Sexual activity: Never  Other Topics Concern  . Not on file  Social History Narrative  . Not on file   Social Determinants of Health   Financial Resource Strain: Not on file  Food Insecurity: Not on file  Transportation Needs: Not on file  Physical Activity: Not on file  Stress: Not on file  Social Connections: Not on file  Intimate Partner  Violence: Not on file     BP (!) 150/60   Pulse 91   Ht 5\' 8"  (1.727 m)   Wt 172 lb 3.2 oz (78.1 kg)   SpO2 95%   BMI 26.18 kg/m   Physical Exam:  Well appearing elderly man, NAD HEENT: Unremarkable Neck:  7 cm JVD, no thyromegally Lymphatics:  No adenopathy Back:  No CVA tenderness Lungs:  Clear except basilar rales HEART:  Regular rate rhythm, no murmurs, no rubs, no clicks Abd:  soft, positive bowel sounds, no organomegally, no rebound, no guarding Ext:  2 plus pulses, trace edema, no cyanosis, no clubbing Skin:  No rashes no nodules Neuro:  CN II through XII intact, motor grossly intact   Assess/Plan: 1. Persistent atrial fib - his rate is well controlled. He is asymptomatic. 2. coags - he will continue warfarin; no bleeding 3. HTN - his SBP is up a bit. He is encouraged to maintain a low sodium diet. 4. Diastolic heart failure - his symptoms are well controlled class. He is encouraged to maintain a low sodium diet.  Royston Sinner Shawnya Mayor,MD

## 2020-12-08 ENCOUNTER — Other Ambulatory Visit: Payer: Self-pay | Admitting: Internal Medicine

## 2020-12-08 MED ORDER — DAPAGLIFLOZIN PROPANEDIOL 10 MG PO TABS: 10.0000 mg | ORAL_TABLET | Freq: Every day | ORAL | 3 refills | Status: DC

## 2020-12-11 DIAGNOSIS — Z7901 Long term (current) use of anticoagulants: Secondary | ICD-10-CM | POA: Diagnosis not present

## 2020-12-20 DIAGNOSIS — Z192 Hormone resistant malignancy status: Secondary | ICD-10-CM | POA: Diagnosis not present

## 2020-12-20 DIAGNOSIS — C775 Secondary and unspecified malignant neoplasm of intrapelvic lymph nodes: Secondary | ICD-10-CM | POA: Diagnosis not present

## 2020-12-20 DIAGNOSIS — R3912 Poor urinary stream: Secondary | ICD-10-CM | POA: Diagnosis not present

## 2021-01-03 DIAGNOSIS — I350 Nonrheumatic aortic (valve) stenosis: Secondary | ICD-10-CM | POA: Diagnosis not present

## 2021-01-03 DIAGNOSIS — E1140 Type 2 diabetes mellitus with diabetic neuropathy, unspecified: Secondary | ICD-10-CM | POA: Diagnosis not present

## 2021-01-03 DIAGNOSIS — E785 Hyperlipidemia, unspecified: Secondary | ICD-10-CM | POA: Diagnosis not present

## 2021-01-03 DIAGNOSIS — I10 Essential (primary) hypertension: Secondary | ICD-10-CM | POA: Diagnosis not present

## 2021-01-03 DIAGNOSIS — I4891 Unspecified atrial fibrillation: Secondary | ICD-10-CM | POA: Diagnosis not present

## 2021-01-03 DIAGNOSIS — M1990 Unspecified osteoarthritis, unspecified site: Secondary | ICD-10-CM | POA: Diagnosis not present

## 2021-01-03 DIAGNOSIS — D649 Anemia, unspecified: Secondary | ICD-10-CM | POA: Diagnosis not present

## 2021-01-03 DIAGNOSIS — Z7901 Long term (current) use of anticoagulants: Secondary | ICD-10-CM | POA: Diagnosis not present

## 2021-01-09 ENCOUNTER — Ambulatory Visit: Payer: Medicare Other | Admitting: Internal Medicine

## 2021-01-12 DIAGNOSIS — Z7901 Long term (current) use of anticoagulants: Secondary | ICD-10-CM | POA: Diagnosis not present

## 2021-01-19 DIAGNOSIS — Z7901 Long term (current) use of anticoagulants: Secondary | ICD-10-CM | POA: Diagnosis not present

## 2021-01-26 DIAGNOSIS — Z7901 Long term (current) use of anticoagulants: Secondary | ICD-10-CM | POA: Diagnosis not present

## 2021-01-31 DIAGNOSIS — Z192 Hormone resistant malignancy status: Secondary | ICD-10-CM | POA: Diagnosis not present

## 2021-02-01 IMAGING — PT NM PET TUM IMG SKULL BASE T - THIGH
1 series · 1 of 1 positions shown · non-contrast
Comparison: None.

CLINICAL DATA: Initial treatment strategy for prostate cancer.

EXAM:
NUCLEAR MEDICINE PET SKULL BASE TO THIGH
TECHNIQUE: 9.49 mCi F-18 PYLARIFY was injected intravenously. Full-ring PET
imaging was performed from the skull base to thigh after the
radiotracer. CT data was obtained and used for attenuation
correction and anatomic localization.

[Series 1110: results mm oncology reading · 1.0mm · 0.45mm/px · 1 of 1 slices shown]
[im 1/1]
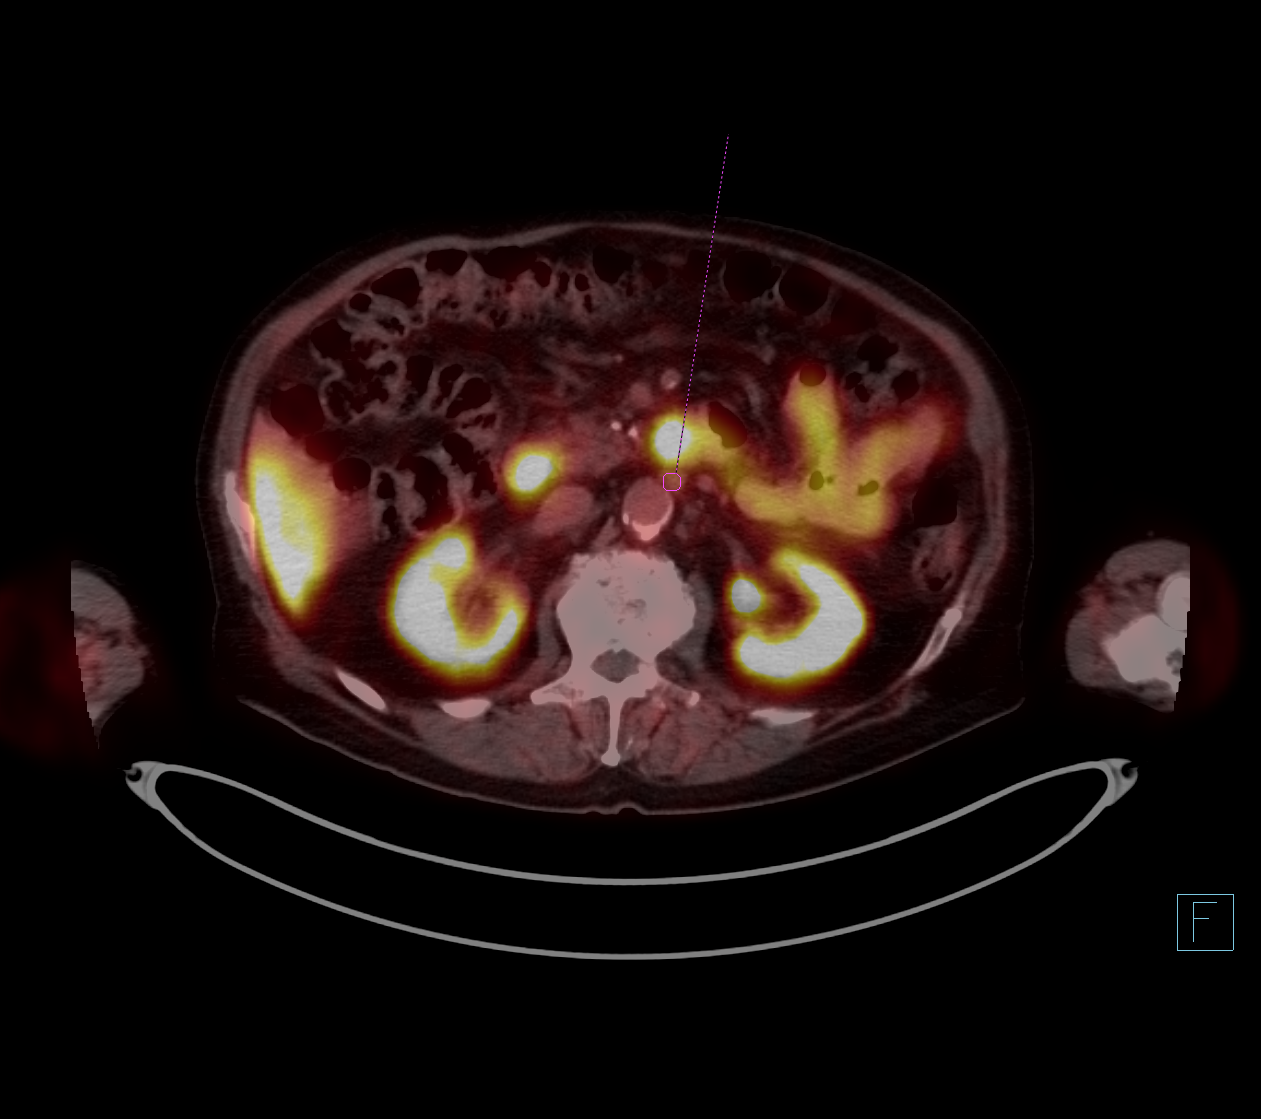

[1 of 1 positions shown; findings below may reference images not displayed]

FINDINGS: NECK

No radiotracer activity in neck lymph nodes.

Incidental CT finding: None

CHEST

No radiotracer accumulation within mediastinal or hilar lymph nodes.
No suspicious pulmonary nodules on the CT scan.

Incidental CT finding: Cardiac enlargement. Aortic atherosclerosis
and multi vessel coronary artery calcifications.

ABDOMEN/PELVIS

Prostate: Within the right-side of the prostate gland just anterior
to the right posterior fiducial marker is a medium size focus of
intense FDG uptake which has an SUV max of 11.2.

Lymph nodes: No abnormal radiotracer accumulation within pelvic or
abdominal nodes.

Liver: No evidence of liver metastasis

Incidental CT finding: Aortic atherosclerosis. Three fiducial
markers identified within the prostate gland.

SKELETON

No focal  activity to suggest skeletal metastasis.
IMPRESSION: 1. There is a medium size focus of increased uptake within the right
side of the prostate gland just anterior to the right posterior
fiducial marker concerning for residual/recurrent prostate cancer.
2. No signs of tracer avid nodal metastasis or distant metastatic
disease.
3. Aortic Atherosclerosis ([id number]). Coronary artery
calcifications.

## 2021-02-09 DIAGNOSIS — Z7901 Long term (current) use of anticoagulants: Secondary | ICD-10-CM | POA: Diagnosis not present

## 2021-03-06 ENCOUNTER — Ambulatory Visit: Payer: Medicare Other | Admitting: Internal Medicine

## 2021-03-09 ENCOUNTER — Encounter: Payer: Self-pay | Admitting: Podiatry

## 2021-03-09 ENCOUNTER — Ambulatory Visit: Payer: Medicare Other | Admitting: Podiatry

## 2021-03-09 ENCOUNTER — Other Ambulatory Visit: Payer: Self-pay

## 2021-03-09 DIAGNOSIS — C775 Secondary and unspecified malignant neoplasm of intrapelvic lymph nodes: Secondary | ICD-10-CM | POA: Insufficient documentation

## 2021-03-09 DIAGNOSIS — D6869 Other thrombophilia: Secondary | ICD-10-CM | POA: Insufficient documentation

## 2021-03-09 DIAGNOSIS — M79675 Pain in left toe(s): Secondary | ICD-10-CM | POA: Diagnosis not present

## 2021-03-09 DIAGNOSIS — C61 Malignant neoplasm of prostate: Secondary | ICD-10-CM | POA: Insufficient documentation

## 2021-03-09 DIAGNOSIS — B351 Tinea unguium: Secondary | ICD-10-CM

## 2021-03-09 DIAGNOSIS — D62 Acute posthemorrhagic anemia: Secondary | ICD-10-CM | POA: Insufficient documentation

## 2021-03-09 DIAGNOSIS — E119 Type 2 diabetes mellitus without complications: Secondary | ICD-10-CM | POA: Diagnosis not present

## 2021-03-09 DIAGNOSIS — M791 Myalgia, unspecified site: Secondary | ICD-10-CM | POA: Insufficient documentation

## 2021-03-09 DIAGNOSIS — K222 Esophageal obstruction: Secondary | ICD-10-CM | POA: Insufficient documentation

## 2021-03-09 DIAGNOSIS — E1142 Type 2 diabetes mellitus with diabetic polyneuropathy: Secondary | ICD-10-CM | POA: Diagnosis not present

## 2021-03-09 DIAGNOSIS — M79674 Pain in right toe(s): Secondary | ICD-10-CM

## 2021-03-09 DIAGNOSIS — E11319 Type 2 diabetes mellitus with unspecified diabetic retinopathy without macular edema: Secondary | ICD-10-CM | POA: Insufficient documentation

## 2021-03-09 DIAGNOSIS — E1140 Type 2 diabetes mellitus with diabetic neuropathy, unspecified: Secondary | ICD-10-CM | POA: Insufficient documentation

## 2021-03-09 DIAGNOSIS — Z9181 History of falling: Secondary | ICD-10-CM | POA: Diagnosis not present

## 2021-03-13 DIAGNOSIS — Z7901 Long term (current) use of anticoagulants: Secondary | ICD-10-CM | POA: Diagnosis not present

## 2021-03-14 DIAGNOSIS — Z192 Hormone resistant malignancy status: Secondary | ICD-10-CM | POA: Diagnosis not present

## 2021-03-17 NOTE — Progress Notes (Signed)
ANNUAL DIABETIC FOOT EXAM  Subjective: [name] presents today for for annual diabetic foot examination, at risk foot care with history of diabetic neuropathy and painful thick toenails that are difficult to trim. Pain interferes with ambulation. Aggravating factors include wearing enclosed shoe gear. Pain is relieved with periodic professional debridement..  Patient relates 30 year h/o diabetes.  Patient denies any h/o foot wounds.  Patient does have symptoms of foot numbness.  Patient does have symptoms of foot tingling.  Patient does have symptoms of burning in feet.  Patient does symptoms of pins/needles in feet.  Patient's blood sugar was 131 mg/dl this morning.   He states he has really suffered with his neuropathy symptoms for the past several months.  He also states he has had 4-5 falls in the past twelve months. He has not been evaluated by Neurology.  [name], MD is patient's PCP. Last visit was 02/09/2021.  Past Medical History:  Diagnosis Date  . Anemia    LOW PLATELETS OTHER DAY  PER PT  . Anticoagulated on Coumadin 12/21/2012  . Aortic stenosis   . Arthritis    "left wrist; back sometimes" (01/21/2013)  . Atrial fibrillation ([place])    [name], DR COOPER  . Bradycardia   . Exertional shortness of breath   . Heart murmur    "I've had it for years; runs in the family on daddy's side" (01/21/2013)  . HTN (hypertension)   . Hyperlipemia 12/21/2012  . Prostate cancer [facility])    "had 40 tx of radiation in 2009" (01/21/2013)  . S/P aortic valve replacement with bioprosthetic valve 01/19/2013   Transcatheter Aortic Valve Replacement using 58mm Sapien bioprosthetic tissue valve via transapical approach  . Type II diabetes mellitus [facility])    Patient Active Problem List   Diagnosis Date Noted  . Diabetic neuropathy ([place]) 03/09/2021  . Diabetic retinopathy ([place]) 03/09/2021  . Esophageal stricture 03/09/2021  . Myalgia 03/09/2021  . Other thrombophilia  ([place]) 03/09/2021  . Prostate cancer ([place]) 03/09/2021  . 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 Nonrheumatic mitral valve regurgitation 09/11/2020  . Nonrheumatic tricuspid valve regurgitation 09/11/2020  . PAD (peripheral artery disease) ([place]) 08/22/2017  . Absolute anemia 04/02/2016  . HLD (hyperlipidemia) 04/02/2016  . Diabetes mellitus with coincident hypertension ([place]) 04/02/2016  . Long term current use of anticoagulant 04/02/2016  . Macrocytosis 04/02/2016  . Arthritis, degenerative 04/02/2016  . History of colon polyps 04/02/2016  . Carcinoma of prostate ([place]) 04/02/2016  . Abdominal discomfort, epigastric 12/20/2015  . H/O adenomatous polyp of colon 12/20/2015  . Left upper quadrant pain 03/16/2015  . Chronic heart failure with preserved ejection fraction ([place]) 08/30/2014  . 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 S/P aortic valve replacement with bioprosthetic valve 01/19/2013  . Diabetes mellitus ([place]) 12/21/2012  . Hyperlipemia 12/21/2012  . H/O prostate cancer 12/21/2012  . Anticoagulated on Coumadin 12/21/2012  . HYPERTENSION, CONTROLLED 09/06/2009  . AORTIC STENOSIS 09/06/2009  . ATRIAL FIBRILLATION, CHRONIC 09/06/2009   Past Surgical History:  Procedure Laterality Date  . BALLOON DILATION N/A 08/02/2020   Procedure: BALLOON DILATION;  Surgeon: [name], MD;  Location: [facility];  Service: Gastroenterology;  Laterality: N/A;  . CARDIAC CATHETERIZATION  12/16/2012  . CARDIAC VALVE REPLACEMENT  01/19/2013   AVR  . CATARACT EXTRACTION W/ INTRAOCULAR LENS  IMPLANT, BILATERAL    . ESOPHAGOGASTRODUODENOSCOPY (EGD) WITH PROPOFOL N/A 08/02/2020   Procedure: ESOPHAGOGASTRODUODENOSCOPY (EGD) WITH PROPOFOL;  Surgeon: [name], MD;  Location[facility];  Service: Gastroenterology;  Laterality: N/A;  . EYE SURGERY    . INTRAOPERATIVE TRANSESOPHAGEAL ECHOCARDIOGRAM N/A 01/19/2013   Procedure:  INTRAOPERATIVE TRANSESOPHAGEAL ECHOCARDIOGRAM;  Surgeon: [name], MD;  Location: [place];  Service: Open Heart Surgery;  Laterality: N/A;  . ORIF FOREARM FRACTURE Left 1954   "compound fx" (01/21/2013)  . TRANSTHORACIC ECHOCARDIOGRAM  09/04/10, 09/07/08   Current Outpatient Medications on File Prior to Visit  Medication Sig Dispense Refill  . ACCU-CHEK AVIVA PLUS test strip USE TO CHECK BLOOD SUGARS 1 TO 2 TIMES A DAY  3  . ACCU-CHEK SOFTCLIX LANCETS lancets daily. use as directed  3  . amLODipine (NORVASC) 5 MG tablet Take 5 mg by mouth daily.     . Ascorbic Acid (VITAMIN C) 1000 MG tablet Take 1,000 mg by mouth 2 (two) times daily.    . B Complex Vitamins (B COMPLEX 50) TABS Take 1 tablet by mouth daily.     . carvedilol (COREG) 12.5 MG tablet Take 1 tablet by mouth twice daily 180 tablet 2  . cholecalciferol (VITAMIN D) 1000 UNITS tablet Take 1,000 Units by mouth 2 (two) times daily.    . clindamycin (CLEOCIN) 300 MG capsule Take 2 capsules (600 mg total) by mouth as needed. Take 1 hour prior to dental work 30 capsule 0  . dapagliflozin propanediol (FARXIGA) 10 MG TABS tablet Take 1 tablet (10 mg total) by mouth daily before breakfast. 90 tablet 3  . doxazosin (CARDURA) 8 MG tablet Take 8 mg by mouth daily.    . ferrous sulfate 325 (65 FE) MG tablet Take 325 mg by mouth daily.    . furosemide (LASIX) 40 MG tablet Take 1 tablet (40 mg total) by mouth daily. 90 tablet 3  . glipiZIDE (GLUCOTROL) 5 MG tablet Take 5 mg by mouth 2 (two) times daily before a meal.    . lisinopril (ZESTRIL) 20 MG tablet Take 1 tablet by mouth once daily 90 tablet 3  . metFORMIN (GLUCOPHAGE) 1000 MG tablet Take 1 tablet by mouth 2 (two) times daily with a meal.    . pantoprazole (PROTONIX) 40 MG tablet Take 1 tablet by mouth daily.    . rosuvastatin (CRESTOR) 5 MG tablet TAKE 1 TABLET BY MOUTH ONCE DAILY 6 IN THE EVENING 90 tablet 3  . Selenium 200 MCG CAPS Take 200 mcg by mouth every morning.    . vitamin A  8000 UNIT capsule Take 8,000 Units by mouth every morning.    . vitamin B-12 (CYANOCOBALAMIN) 1000 MCG tablet Take 1,000 mcg by mouth 2 (two) times daily.    Marland Kitchen warfarin (COUMADIN) 2.5 MG tablet Take 2.5 mg by mouth as directed.    . zinc gluconate 50 MG tablet Take 50 mg by mouth every morning.     Current Facility-Administered Medications on File Prior to Visit  Medication Dose Route Frequency Provider Last Rate Last Admin  . norepinephrine (LEVOPHED) 8 mg in dextrose 5 % 250 mL infusion  2-50 mcg/min Intravenous Titrated [name], MD        Allergies  Allergen Reactions  . Amoxil [Amoxicillin]     Burning of mouth, tongue and lips.   Social History   Occupational History  . Not on file  Tobacco Use  . Smoking status: Former Smoker    Packs/day: 2.00    Years: 22.00    Pack years: 44.00    Types: Cigarettes  Quit date: 09/11/1976    Years since quitting: 44.5  . Smokeless tobacco: Never Used  Substance and Sexual Activity  . Alcohol use: No  . Drug use: No  . Sexual activity: Never   Family History  Problem Relation Age of Onset  . Diabetes Mother   . Melanoma Mother 83  . Bone cancer Mother 92  . Pancreatic cancer Father 73  . Heart disease Father   . Stroke Sister   . Cancer Brother 20       ? brain cancer   Immunization History  Administered Date(s) Administered  . Influenza-Unspecified 07/05/2012     Review of Systems: Negative except as noted in the HPI.  Objective: There were no vitals filed for this visit.  TAAVI HOOSE is a pleasant 87 y.o. male in NAD. AAO X 3.  Vascular Examination: Capillary refill time to digits immediate b/l. Palpable pedal pulses b/l LE. Pedal hair absent. Lower extremity skin temperature gradient within normal limits. No pain with calf compression b/l. Trace edema noted b/l lower extremities.  Dermatological Examination: Pedal skin with normal turgor, texture and tone bilaterally. No open wounds bilaterally. No  interdigital macerations bilaterally. Toenails 1-5 b/l elongated, discolored, dystrophic, thickened, crumbly with subungual debris and tenderness to dorsal palpation.  Musculoskeletal Examination: Normal muscle strength 5/5 to all lower extremity muscle groups bilaterally. No pain crepitus or joint limitation noted with ROM b/l. No gross bony deformities bilaterally. Utilizes cane for ambulation assistance.  Footwear Assessment: Does the patient wear appropriate shoes? Yes. Does the patient need inserts/orthotics? No.  Neurological Examination: Pt has subjective symptoms of neuropathy. Protective sensation diminished with 10g monofilament b/l. Vibratory sensation intact b/l.  Assessment: 1. Pain due to onychomycosis of toenails of both feet   2. Personal history of fall   3. Diabetic peripheral neuropathy associated with type 2 diabetes mellitus ([place])   4. Encounter for diabetic foot exam ([place])     ADA Risk Categorization: High Risk  Patient has one or more of the following: Loss of protective sensation Absent pedal pulses Severe Foot deformity History of foot ulcer  Plan: -Examined patient. -No new findings. No new orders. -Diabetic foot examination performed on today's visit. -Patient to continue soft, supportive shoe gear daily. -Toenails 1-5 b/l were debrided in length and girth with sterile nail nippers and dremel without iatrogenic bleeding.  -Patient to report any pedal injuries to medical professional immediately. -We discussed Mr. Kollmann worsening neuropathy symptoms and history of falls. Ordered Neurology Consult written on prescription pad for him to give to his PCP. -Patient/POA to call should there be question/concern in the interim.  Return in about 3 months (around 06/09/2021).  Marzetta Board, DPM

## 2021-03-19 ENCOUNTER — Encounter: Payer: Self-pay | Admitting: Neurology

## 2021-03-23 ENCOUNTER — Other Ambulatory Visit: Payer: Self-pay

## 2021-03-23 ENCOUNTER — Encounter (INDEPENDENT_AMBULATORY_CARE_PROVIDER_SITE_OTHER): Payer: Medicare Other | Admitting: Ophthalmology

## 2021-03-23 DIAGNOSIS — H43813 Vitreous degeneration, bilateral: Secondary | ICD-10-CM

## 2021-03-23 DIAGNOSIS — H353121 Nonexudative age-related macular degeneration, left eye, early dry stage: Secondary | ICD-10-CM

## 2021-03-23 DIAGNOSIS — H33302 Unspecified retinal break, left eye: Secondary | ICD-10-CM

## 2021-03-23 DIAGNOSIS — I10 Essential (primary) hypertension: Secondary | ICD-10-CM

## 2021-03-23 DIAGNOSIS — H35033 Hypertensive retinopathy, bilateral: Secondary | ICD-10-CM

## 2021-03-23 DIAGNOSIS — H35371 Puckering of macula, right eye: Secondary | ICD-10-CM

## 2021-04-03 NOTE — Progress Notes (Signed)
Cardiology Office Note:    Date:  04/04/2021   ID:  [name], DOB 10-12-1933, MRN [id number]  PCP:  [name], MD  [facility] Cardiologist:  Rudean Haskell MD [place] Electrophysiologist:  None   CC:  Follow up Diuresis Leg swelling  History of Present Illness:    [name] is a 87 y.o. male with a hx of Coronary Artery Calcification, severe aortic stenosis s/p transapical TAVR [name] 28, Dr. Burt Knack, 2014), HFpEF, Moderate Mitral Regurgitation, Moderate Tricuspid Regurgitation, DM with HTN, persistent atrial fibrillation seen by Dr. Lovena Le, PAD seen by Dr. [name] who presented for evaluation 09/11/20.  Found to have elevated BNP and was started on Farxiga and lasix 40 mg PO Daily- developed AKI.  Has had lasix does reduced and farixga held with improvement of kidney function but with leg swelling.  12/20 returned his diuretics.  Saw Dr. [name] and had normal ABIs.In interim of this visit, patient saw EP- Taylor with no changes made.  Patient notes that he is doing OK.  Since last visit notes that he is able to walk to the story without breathing issues, except going up the stairs WITH groceries.  There are no interval hospital/ED visit.    No chest pain or pressure.  No SOB at rest and no PND/Orthopnea.  No weight gain, but swelling has improved; no compression stockings today.  No palpitations or syncope.  Has leg pain that is stable.   Past Medical History:  Diagnosis Date  . Anemia    LOW PLATELETS OTHER DAY  PER PT  . Anticoagulated on Coumadin 12/21/2012  . Aortic stenosis   . Arthritis    "left wrist; back sometimes" (01/21/2013)  . Atrial fibrillation ([place])    [name], DR COOPER  . Bradycardia   . Exertional shortness of breath   . Heart murmur    "I've had it for years; runs in the family on daddy's side" (01/21/2013)  . HTN (hypertension)   . Hyperlipemia 12/21/2012  . Prostate cancer [facility])    "had 40 tx of radiation in 2009"  (01/21/2013)  . S/P aortic valve replacement with bioprosthetic valve 01/19/2013   Transcatheter Aortic Valve Replacement using 23mm Sapien bioprosthetic tissue valve via transapical approach  . Type II diabetes mellitus ([place])     Past Surgical History:  Procedure Laterality Date  . BALLOON DILATION N/A 08/02/2020   Procedure: BALLOON DILATION;  Surgeon: [name], MD;  Location: [facility];  Service: Gastroenterology;  Laterality: N/A;  . CARDIAC CATHETERIZATION  12/16/2012  . CARDIAC VALVE REPLACEMENT  01/19/2013   AVR  . CATARACT EXTRACTION W/ INTRAOCULAR LENS  IMPLANT, BILATERAL    . ESOPHAGOGASTRODUODENOSCOPY (EGD) WITH PROPOFOL N/A 08/02/2020   Procedure: ESOPHAGOGASTRODUODENOSCOPY (EGD) WITH PROPOFOL;  Surgeon: [name], MD;  Location: [facility];  Service: Gastroenterology;  Laterality: N/A;  . EYE SURGERY    . INTRAOPERATIVE TRANSESOPHAGEAL ECHOCARDIOGRAM N/A 01/19/2013   Procedure: INTRAOPERATIVE TRANSESOPHAGEAL ECHOCARDIOGRAM;  Surgeon: [name], MD;  Location: [place];  Service: Open Heart Surgery;  Laterality: N/A;  . ORIF FOREARM FRACTURE Left 1954   "compound fx" (01/21/2013)  . TRANSTHORACIC ECHOCARDIOGRAM  09/04/10, 09/07/08    Current Medications: Current Meds  Medication Sig  . ACCU-CHEK AVIVA PLUS test strip USE TO CHECK BLOOD SUGARS 1 TO 2 TIMES A DAY  . ACCU-CHEK SOFTCLIX LANCETS lancets daily. use as directed  . amLODipine (NORVASC) 5 MG tablet Take 5 mg by mouth daily.   . Ascorbic  Acid (VITAMIN C) 1000 MG tablet Take 1,000 mg by mouth 2 (two) times daily.  . B Complex Vitamins (B COMPLEX 50) TABS Take 1 tablet by mouth daily.   . carvedilol (COREG) 12.5 MG tablet Take 1 tablet by mouth twice daily  . cholecalciferol (VITAMIN D) 1000 UNITS tablet Take 1,000 Units by mouth 2 (two) times daily.  . clindamycin (CLEOCIN) 300 MG capsule Take 2 capsules (600 mg total) by mouth as needed. Take 1 hour prior to dental work  . dapagliflozin propanediol (FARXIGA)  10 MG TABS tablet Take 1 tablet (10 mg total) by mouth daily before breakfast.  . doxazosin (CARDURA) 8 MG tablet Take 8 mg by mouth daily.  . ferrous sulfate 325 (65 FE) MG tablet Take 325 mg by mouth daily.  Marland Kitchen glipiZIDE (GLUCOTROL) 5 MG tablet Take 5 mg by mouth 2 (two) times daily before a meal.  . lisinopril (ZESTRIL) 20 MG tablet Take 1 tablet by mouth once daily  . metFORMIN (GLUCOPHAGE) 1000 MG tablet Take 1 tablet by mouth 2 (two) times daily with a meal.  . pantoprazole (PROTONIX) 40 MG tablet Take 1 tablet by mouth daily.  . rosuvastatin (CRESTOR) 5 MG tablet TAKE 1 TABLET BY MOUTH ONCE DAILY 6 IN THE EVENING  . Selenium 200 MCG CAPS Take 200 mcg by mouth every morning.  . vitamin A 8000 UNIT capsule Take 8,000 Units by mouth every morning.  . vitamin B-12 (CYANOCOBALAMIN) 1000 MCG tablet Take 1,000 mcg by mouth 2 (two) times daily.  Marland Kitchen warfarin (COUMADIN) 2.5 MG tablet Take 2.5 mg by mouth as directed.  . zinc gluconate 50 MG tablet Take 50 mg by mouth every morning.     Allergies:   Amoxil [amoxicillin]   Social History   Socioeconomic History  . Marital status: Married    Spouse name: Not on file  . Number of children: Not on file  . Years of education: Not on file  . Highest education level: Not on file  Occupational History  . Not on file  Tobacco Use  . Smoking status: Former Smoker    Packs/day: 2.00    Years: 22.00    Pack years: 44.00    Types: Cigarettes    Quit date: 09/11/1976    Years since quitting: 44.5  . Smokeless tobacco: Never Used  Substance and Sexual Activity  . Alcohol use: No  . Drug use: No  . Sexual activity: Never  Other Topics Concern  . Not on file  Social History Narrative  . Not on file   Social Determinants of Health   Financial Resource Strain: Not on file  Food Insecurity: Not on file  Transportation Needs: Not on file  Physical Activity: Not on file  Stress: Not on file  Social Connections: Not on file    Family  History: The patient's family history includes Bone cancer (age of onset: 74) in his mother; Cancer (age of onset: 4) in his brother; Diabetes in his mother; Heart disease in his father; Melanoma (age of onset: 17) in his mother; Pancreatic cancer (age of onset: 74) in his father; Stroke in his sister.  ROS:   Please see the history of present illness.    All other systems reviewed and are negative.  EKGs/Labs/Other Studies Reviewed:    The following studies were reviewed today:  EKG:   09/11/20 Atrial fibrillation Rate 63  Transthoracic Echocardiogram: Date:08/18/20: Results: Moderate eccentric mitral regurgitation without prolapse and Moderate tricuspid regurgitation, mild RV  dysfunction, TAVR gradients OK 1. Left ventricular ejection fraction, by estimation, is 55 to 60%. The  left ventricle has normal function. The left ventricle has no regional  wall motion abnormalities. There is moderate left ventricular hypertrophy.  Left ventricular diastolic  parameters are indeterminate.  2. Right ventricular systolic function is mildly reduced. The right  ventricular size is normal. There is severely elevated pulmonary artery  systolic pressure. The estimated right ventricular systolic pressure is  37.4 mmHg.  3. Left atrial size was moderately dilated.  4. Right atrial size was moderately dilated.  5. The mitral valve is abnormal, there is restriction of the posterior  leaflet. Moderate mitral valve regurgitation with PISA ERO 0.25 cm^2. No  evidence of mitral stenosis. Moderate mitral annular calcification.  6. Tricuspid valve regurgitation is moderate.  7. S/p TAVR with 26 mm Edwards Sapien bioprosthesis. Mean gradient 8  mmHg, no evidence for significant stenosis. No significant peri-valvular  leakage noted.  8. The inferior vena cava is dilated in size with >50% respiratory  variability, suggesting right atrial pressure of 8 mmHg.  9. The patient was in atrial  fibrillation.   Recent Labs: 11/22/2020: BUN 41; Creatinine, Ser 1.18; Magnesium 2.1; NT-Pro BNP 3,129; Potassium 4.6; Sodium 140  Recent Lipid Panel    Component Value Date/Time   CHOL 81 (L) 08/17/2018 0748   TRIG 82 08/17/2018 0748   HDL 28 (L) 08/17/2018 0748   CHOLHDL 2.9 08/17/2018 0748   LDLCALC 37 08/17/2018 0748    Risk Assessment/Calculations:     CHA2DS2-VASc Score = 5  This indicates a 7.2% annual risk of stroke. The patient's score is based upon: CHF History: No HTN History: Yes Diabetes History: Yes Stroke History: No Vascular Disease History: Yes Age Score: 2 Gender Score: 0      Physical Exam:    VS:  BP 140/60   Pulse 72   Ht 5\' 8"  (1.727 m)   Wt 75.8 kg   SpO2 95%   BMI 25.39 kg/m     Wt Readings from Last 3 Encounters:  04/04/21 75.8 kg  12/07/20 78.1 kg  12/01/20 78.5 kg    MOL:MBEMLJQ male, well developed in no acute distress HEENT: Bilateral Frank's Sign NECK: No JVD LYMPHATICS: No lymphadenopathy CARDIAC: irregular irregular, holosystolic II/VI murmur, with no rubs, gallops RESPIRATORY:  Clear to auscultation without rales, wheezing or rhonchi  ABDOMEN: Soft, non-tender, non-distended MUSCULOSKELETAL:  +1 edema bilateral (+2 at the shin) stable from prior SKIN: Warm and dry NEUROLOGIC:  Alert and oriented x 3 PSYCHIATRIC:  Normal affect   ASSESSMENT:    1. Nonrheumatic tricuspid valve regurgitation   2. Nonrheumatic mitral valve regurgitation   3. Stage 3a chronic kidney disease ([place])   4. HYPERTENSION, CONTROLLED   5. Longstanding persistent atrial fibrillation ([place])   6. Chronic heart failure with preserved ejection fraction (H[facility])   7. Diabetes mellitus with coincident hypertension ([place])   8. PAD (peripheral artery disease) ([place])   9. S/P aortic valve replacement with bioprosthetic valve   10. Hyperlipidemia, unspecified hyperlipidemia type   11. Anticoagulated on Coumadin    PLAN:    Severe AS s/p TAVR HFpEF Moderate  Mitral Regurgitation Moderate Tricuspid Regurgitation HTN with DM Long standing, persistent atrial fibrillation on coumadin seen by Dr. Lovena Le Peripheral Arterial Disease seen by Dr. [name] HLD with LDL < 70 - CHADSVASC 4 - NYHA class I, Stage B,  etiology likely atrial functional MR - Diuretic regimen: Lasix 40 mg PO daily -  we have discussed this is a balance between his kidney function and his volume status; we is in agreement with regimen  - 2 L Fluid restriction - BMP and Mg today - Coreg at 12.5 mg PO BID  - Lisinopril 20 mg Po daily - SGLT2i at 10 mg PO daily  - will do echo in 6 months prior to visit (idealy same day) - Has Clindamycin 600 mg PO PRN dental procedure (1 hr prior) - Continue compression stockings - AC per EP and Phamacy   Six months follow up unless new symptoms or abnormal test results warranting change in plan  Would be reasonable for  APP Follow up   Medication Adjustments/Labs and Tests Ordered: Current medicines are reviewed at length with the patient today.  Concerns regarding medicines are outlined above.  Orders Placed This Encounter  Procedures  . Basic metabolic panel  . Magnesium  . ECHOCARDIOGRAM COMPLETE   No orders of the defined types were placed in this encounter.   Patient Instructions  Medication Instructions:  Your physician recommends that you continue on your current medications as directed. Please refer to the Current Medication list given to you today.  *If you need a refill on your cardiac medications before your next appointment, please call your pharmacy*   Lab Work: TODAY: BMP, MG If you have labs (blood work) drawn today and your tests are completely normal, you will receive your results only by: Marland Kitchen MyChart Message (if you have MyChart) OR . A paper copy in the mail If you have any lab test that is abnormal or we need to change your treatment, we will call you to review the results.   Testing/Procedures: Your  physician has requested that you have an echocardiogram in 6 months. Echocardiography is a painless test that uses sound waves to create images of your heart. It provides your doctor with information about the size and shape of your heart and how well your heart's chambers and valves are working. This procedure takes approximately one hour. There are no restrictions for this procedure.     Follow-Up: At [facility], you and your health needs are our priority.  As part of our continuing mission to provide you with exceptional heart care, we have created designated Provider Care Teams.  These Care Teams include your primary Cardiologist (physician) and Advanced Practice Providers (APPs -  Physician Assistants and Nurse Practitioners) who all work together to provide you with the care you need, when you need it.  We recommend signing up for the patient portal called "MyChart".  Sign up information is provided on this After Visit Summary.  MyChart is used to connect with patients for Virtual Visits (Telemedicine).  Patients are able to view lab/test results, encounter notes, upcoming appointments, etc.  Non-urgent messages can be sent to your provider as well.   To learn more about what you can do with MyChart, go to [email or web address].    Your next appointment:   6 month(s)  The format for your next appointment:   In Person  Provider:   You may see Rudean Haskell, MD or one of the following Advanced Practice Providers on your designated Care Team:    Melina Copa, PA-C  Ermalinda Barrios, PA-C          Signed, [name], MD  04/04/2021 9:05 AM    [place]

## 2021-04-04 ENCOUNTER — Ambulatory Visit: Payer: Medicare Other | Admitting: Internal Medicine

## 2021-04-04 ENCOUNTER — Other Ambulatory Visit: Payer: Self-pay

## 2021-04-04 ENCOUNTER — Encounter: Payer: Self-pay | Admitting: Internal Medicine

## 2021-04-04 VITALS — BP 140/60 | HR 72 | Ht 68.0 in | Wt 167.0 lb

## 2021-04-04 DIAGNOSIS — Z7901 Long term (current) use of anticoagulants: Secondary | ICD-10-CM

## 2021-04-04 DIAGNOSIS — I4811 Longstanding persistent atrial fibrillation: Secondary | ICD-10-CM

## 2021-04-04 DIAGNOSIS — E785 Hyperlipidemia, unspecified: Secondary | ICD-10-CM

## 2021-04-04 DIAGNOSIS — I10 Essential (primary) hypertension: Secondary | ICD-10-CM

## 2021-04-04 DIAGNOSIS — N1831 Chronic kidney disease, stage 3a: Secondary | ICD-10-CM

## 2021-04-04 DIAGNOSIS — I340 Nonrheumatic mitral (valve) insufficiency: Secondary | ICD-10-CM

## 2021-04-04 DIAGNOSIS — I739 Peripheral vascular disease, unspecified: Secondary | ICD-10-CM

## 2021-04-04 DIAGNOSIS — I5032 Chronic diastolic (congestive) heart failure: Secondary | ICD-10-CM | POA: Diagnosis not present

## 2021-04-04 DIAGNOSIS — I361 Nonrheumatic tricuspid (valve) insufficiency: Secondary | ICD-10-CM

## 2021-04-04 DIAGNOSIS — E119 Type 2 diabetes mellitus without complications: Secondary | ICD-10-CM

## 2021-04-04 DIAGNOSIS — Z953 Presence of xenogenic heart valve: Secondary | ICD-10-CM | POA: Diagnosis not present

## 2021-04-04 LAB — MAGNESIUM: Magnesium: 2.3 mg/dL (ref 1.6–2.3)

## 2021-04-04 LAB — BASIC METABOLIC PANEL
BUN/Creatinine Ratio: 33 — ABNORMAL HIGH (ref 10–24)
BUN: 46 mg/dL — ABNORMAL HIGH (ref 8–27)
CO2: 25 mmol/L (ref 20–29)
Calcium: 9.7 mg/dL (ref 8.6–10.2)
Chloride: 101 mmol/L (ref 96–106)
Creatinine, Ser: 1.38 mg/dL — ABNORMAL HIGH (ref 0.76–1.27)
Glucose: 195 mg/dL — ABNORMAL HIGH (ref 65–99)
Potassium: 5.2 mmol/L (ref 3.5–5.2)
Sodium: 143 mmol/L (ref 134–144)
eGFR: 50 mL/min/{1.73_m2} — ABNORMAL LOW (ref >59)

## 2021-04-04 NOTE — Patient Instructions (Signed)
Medication Instructions:  Your physician recommends that you continue on your current medications as directed. Please refer to the Current Medication list given to you today.  *If you need a refill on your cardiac medications before your next appointment, please call your pharmacy*   Lab Work: TODAY: BMP, MG If you have labs (blood work) drawn today and your tests are completely normal, you will receive your results only by: Marland Kitchen MyChart Message (if you have MyChart) OR . A paper copy in the mail If you have any lab test that is abnormal or we need to change your treatment, we will call you to review the results.   Testing/Procedures: Your physician has requested that you have an echocardiogram in 6 months. Echocardiography is a painless test that uses sound waves to create images of your heart. It provides your doctor with information about the size and shape of your heart and how well your heart's chambers and valves are working. This procedure takes approximately one hour. There are no restrictions for this procedure.     Follow-Up: At [facility], you and your health needs are our priority.  As part of our continuing mission to provide you with exceptional heart care, we have created designated Provider Care Teams.  These Care Teams include your primary Cardiologist (physician) and Advanced Practice Providers (APPs -  Physician Assistants and Nurse Practitioners) who all work together to provide you with the care you need, when you need it.  We recommend signing up for the patient portal called "MyChart".  Sign up information is provided on this After Visit Summary.  MyChart is used to connect with patients for Virtual Visits (Telemedicine).  Patients are able to view lab/test results, encounter notes, upcoming appointments, etc.  Non-urgent messages can be sent to your provider as well.   To learn more about what you can do with MyChart, go to [email or web address].    Your next  appointment:   6 month(s)  The format for your next appointment:   In Person  Provider:   You may see Rudean Haskell, MD or one of the following Advanced Practice Providers on your designated Care Team:    Melina Copa, PA-C  Ermalinda Barrios, PA-C

## 2021-04-11 DIAGNOSIS — Z7901 Long term (current) use of anticoagulants: Secondary | ICD-10-CM | POA: Diagnosis not present

## 2021-05-14 DIAGNOSIS — Z7901 Long term (current) use of anticoagulants: Secondary | ICD-10-CM | POA: Diagnosis not present

## 2021-05-21 ENCOUNTER — Other Ambulatory Visit: Payer: Self-pay

## 2021-05-21 ENCOUNTER — Ambulatory Visit (INDEPENDENT_AMBULATORY_CARE_PROVIDER_SITE_OTHER): Payer: Medicare Other | Admitting: Neurology

## 2021-05-21 ENCOUNTER — Encounter: Payer: Self-pay | Admitting: Neurology

## 2021-05-21 VITALS — BP 161/64 | HR 83 | Ht 68.0 in | Wt 168.0 lb

## 2021-05-21 DIAGNOSIS — E1142 Type 2 diabetes mellitus with diabetic polyneuropathy: Secondary | ICD-10-CM

## 2021-05-21 DIAGNOSIS — M48062 Spinal stenosis, lumbar region with neurogenic claudication: Secondary | ICD-10-CM | POA: Diagnosis not present

## 2021-05-21 MED ORDER — GABAPENTIN 100 MG PO CAPS: 100.0000 mg | ORAL_CAPSULE | Freq: Every day | ORAL | 3 refills | Status: DC

## 2021-05-21 NOTE — Patient Instructions (Addendum)
Start gabapentin 100mg  at bedtime  Encouraged to use a walker  Return to clinic in 4 months

## 2021-05-21 NOTE — Progress Notes (Signed)
[facility] Clinic Note - Initial Visit   Date: 05/21/21  [name] MRN: [id number] DOB: [birth year]   Dear Dr. Orland Mustard:  Thank you for your kind referral of [name] for consultation of neuropathy. Although his history is well known to you, please allow [place] to reiterate it for the purpose of our medical record. The patient was accompanied to the clinic by self.    History of Present Illness: [name] is a 87 y.o. right-handed male with atrial fibrillation (on anticoagulation), aortic stenosis s/p TAVR, HF, PAD, diabetes mellitus, hypertension, hyperlipidemia, GERD, history of prostate cancer, and OA presenting for evaluation of neuropathy and imbalance.   For several years, he has numbness/tingling of the feet and legs, up to the knees.  Symptoms are constant and worse with prolonged standing and walking. He has difficulty walking to his mailbox which is 100 feet away. He has Walks with a cane for the past two years.  He has fallen four times this year.  Fortunately, no severe injuries.  He is on anticoulaguation therapy. He has known lumbar canal stenosis at L2-3 and L4-5 and seen Dr. Louanne Skye for lumbar canal stenosis. He was offered surgery and declined.  He is the primary caregiver to his wife who is predominately wheelchair-bound.   He previously worked as a Engineer, structural, drove an IT sales professional, Social research officer, government, and retired from [employer], broadcasting/film/video.    Out-side paper records, electronic medical record, and images have been reviewed where available and summarized as:  MRI lumbar spine wo contrast 05/22/2019: 12 mm marrow space focus in the superior L1 vertebral body. In a patient with a history of prostate cancer, this could represent a metastatic deposit. However, this is an isolated finding which would be somewhat unusual.   L1-2: 3 mm retrolisthesis. Mild bulging of the disc. Mild stenosis of the lateral recesses and  foramina but without definite neural compression.   L2-3: Multifactorial spinal stenosis that could cause neural compression on either or both sides. 3 mm retrolisthesis. Bulging of the disc. Facet and ligamentous hypertrophy. Discogenic edema on the right that could be associated with back pain.   L3-4: Bilateral lateral recess and foraminal narrowing that could cause neural compression on either side. Foraminal narrowing more marked on the right. Endplate osteophytes and bulging of the disc in combination with facet hypertrophy.   L4-5: Multifactorial spinal stenosis that could cause neural compression on either or both sides. Bilateral facet arthropathy with 3 mm of anterolisthesis. Mild bulging of the disc.   L5-S1: Endplate osteophytes and bulging of the disc. Facet osteoarthritis. Mild bilateral foraminal narrowing but without definite neural compression.  Lab Results  Component Value Date   HGBA1C 7.6 (H) 01/15/2013    Past Medical History:  Diagnosis Date   Anemia    LOW PLATELETS OTHER DAY  PER PT   Anticoagulated on Coumadin 12/21/2012   Aortic stenosis    Arthritis    "left wrist; back sometimes" (01/21/2013)   Atrial fibrillation ([place])    [name], DR COOPER   Bradycardia    Exertional shortness of breath    Heart murmur    "I've had it for years; runs in the family on daddy's side" (01/21/2013)   HTN (hypertension)    Hyperlipemia 12/21/2012   Prostate cancer ([place])    "had 40 tx of radiation in 2009" (01/21/2013)   S/P aortic valve replacement with bioprosthetic valve 01/19/2013   Transcatheter Aortic Valve Replacement using 84mm Sapien  bioprosthetic tissue valve via transapical approach   Type II diabetes mellitus ([place])     Past Surgical History:  Procedure Laterality Date   BALLOON DILATION N/A 08/02/2020   Procedure: BALLOON DILATION;  Surgeon: [name], MD;  Location: [facility];  Service: Gastroenterology;  Laterality: N/A;   CARDIAC  CATHETERIZATION  12/16/2012   CARDIAC VALVE REPLACEMENT  01/19/2013   AVR   CATARACT EXTRACTION W/ INTRAOCULAR LENS  IMPLANT, BILATERAL     ESOPHAGOGASTRODUODENOSCOPY (EGD) WITH PROPOFOL N/A 08/02/2020   Procedure: ESOPHAGOGASTRODUODENOSCOPY (EGD) WITH PROPOFOL;  Surgeon: [name], MD;  Location: [facility];  Service: Gastroenterology;  Laterality: N/A;   EYE SURGERY     INTRAOPERATIVE TRANSESOPHAGEAL ECHOCARDIOGRAM N/A 01/19/2013   Procedure: INTRAOPERATIVE TRANSESOPHAGEAL ECHOCARDIOGRAM;  Surgeon: [name], MD;  Location: [facility];  Service: Open Heart Surgery;  Laterality: N/A;   ORIF FOREARM FRACTURE Left 1954   "compound fx" (01/21/2013)   TRANSTHORACIC ECHOCARDIOGRAM  09/04/10, 09/07/08     Medications:  Outpatient Encounter Medications as of 05/21/2021  Medication Sig   ACCU-CHEK AVIVA PLUS test strip USE TO CHECK BLOOD SUGARS 1 TO 2 TIMES A DAY   ACCU-CHEK SOFTCLIX LANCETS lancets daily. use as directed   amLODipine (NORVASC) 5 MG tablet Take 5 mg by mouth daily.    Ascorbic Acid (VITAMIN C) 1000 MG tablet Take 1,000 mg by mouth 2 (two) times daily.   B Complex Vitamins (B COMPLEX 50) TABS Take 1 tablet by mouth daily.    carvedilol (COREG) 12.5 MG tablet Take 1 tablet by mouth twice daily   cholecalciferol (VITAMIN D) 1000 UNITS tablet Take 1,000 Units by mouth 2 (two) times daily.   dapagliflozin propanediol (FARXIGA) 10 MG TABS tablet Take 1 tablet (10 mg total) by mouth daily before breakfast.   doxazosin (CARDURA) 8 MG tablet Take 8 mg by mouth daily.   ferrous sulfate 325 (65 FE) MG tablet Take 325 mg by mouth daily.   furosemide (LASIX) 40 MG tablet Take 1 tablet (40 mg total) by mouth daily.   glipiZIDE (GLUCOTROL) 5 MG tablet Take 5 mg by mouth 2 (two) times daily before a meal.   lisinopril (ZESTRIL) 20 MG tablet Take 1 tablet by mouth once daily   metFORMIN (GLUCOPHAGE) 1000 MG tablet Take 1 tablet by mouth 2 (two) times daily with a meal.   pantoprazole  (PROTONIX) 40 MG tablet Take 1 tablet by mouth daily.   rosuvastatin (CRESTOR) 5 MG tablet TAKE 1 TABLET BY MOUTH ONCE DAILY 6 IN THE EVENING   Selenium 200 MCG CAPS Take 200 mcg by mouth every morning.   vitamin A 8000 UNIT capsule Take 8,000 Units by mouth every morning.   vitamin B-12 (CYANOCOBALAMIN) 1000 MCG tablet Take 1,000 mcg by mouth 2 (two) times daily.   warfarin (COUMADIN) 2.5 MG tablet Take 2.5 mg by mouth as directed.   zinc gluconate 50 MG tablet Take 50 mg by mouth every morning.   clindamycin (CLEOCIN) 300 MG capsule Take 2 capsules (600 mg total) by mouth as needed. Take 1 hour prior to dental work   Facility-Administered Encounter Medications as of 05/21/2021  Medication   norepinephrine (LEVOPHED) 8 mg in dextrose 5 % 250 mL infusion    Allergies:  Allergies  Allergen Reactions   Amoxicillin Other (See Comments)    Burning of mouth, tongue and lips.    Family History: Family History  Problem Relation Age of Onset   Diabetes Mother    Melanoma Mother  60   Bone cancer Mother 12   Pancreatic cancer Father 53   Heart disease Father    Stroke Sister    Cancer Brother 67       ? brain cancer    Social History: Social History   Tobacco Use   Smoking status: Former    Packs/day: 2.00    Years: 22.00    Pack years: 44.00    Types: Cigarettes    Quit date: 09/11/1976    Years since quitting: 44.7   Smokeless tobacco: Never  Substance Use Topics   Alcohol use: No   Drug use: No   Social History   Social History Narrative   Not on file    Vital Signs:  BP (!) 161/64   Pulse 83   Ht 5\' 8"  (1.727 m)   Wt 168 lb (76.2 kg)   SpO2 94%   BMI 25.54 kg/m    Neurological Exam: MENTAL STATUS including orientation to time, place, person, recent and remote memory, attention span and concentration, language, and fund of knowledge is normal.  Speech is not dysarthric.  CRANIAL NERVES: II:  No visual field defects.     III-IV-VI: Pupils equal round and  reactive to light.  Normal conjugate, extra-ocular eye movements in all directions of gaze.  No nystagmus.  No ptosis.   V:  Normal facial sensation.    VII:  Normal facial symmetry and movements.   VIII:  Normal hearing and vestibular function.   IX-X:  Normal palatal movement.   XI:  Normal shoulder shrug and head rotation.   XII:  Normal tongue strength and range of motion, no deviation or fasciculation.  MOTOR:  Generalized atrophy of the lower legs, fasciculations or abnormal movements.  No pronator drift.  Left shoulder ROM limited by left shoulder pain Upper Extremity:  Right  Left  Deltoid  5/5   5/5   Biceps  5/5   5/5   Triceps  5/5   5/5   Infraspinatus 5/5  5/5  Medial pectoralis 5/5  5/5  Wrist extensors  5/5   5/5   Wrist flexors  5/5   5/5   Finger extensors  5/5   5/5   Finger flexors  5/5   5/5   Dorsal interossei  5/5   5/5   Abductor pollicis  5/5   5/5   Tone (Ashworth scale)  0  0   Lower Extremity:  Right  Left  Hip flexors  4/5   5/5   Hip extensors  5/5   5/5   Adductor 5/5  5/5  Abductor 5/5  5/5  Knee flexors  5/5   5/5   Knee extensors  5/5   5/5   Dorsiflexors  4/5   4/5   Plantarflexors  4/5   4/5   Toe extensors  4/5   4/5   Toe flexors  4/5   4/5   Tone (Ashworth scale)  0  0   MSRs:  Right        Left                  brachioradialis 2+  2+  biceps 2+  2+  triceps 2+  2+  patellar 2+  2+  ankle jerk 0  0  Hoffman no  no  plantar response down  down   SENSORY:  Vibration, temperature, and pin prick reduced from mid-calf down into the feet. Sensation intact in the arms.  COORDINATION/GAIT: Normal finger-to- nose-finger.  Intact rapid alternating movements bilaterally. Gait appears, stooped, slow, right leg is externally rotated, assisted with a cane, somewhat unsteady.   IMPRESSION: Peripheral neuropathy, contributed by diabetes, manifesting with distal paresthesias, weakness, and ataxia  - PT for balance training declined  - Urged  patient to use a walker to minimize risk of falls  - Fall prevention discussed at length, especially since he is on anticoagulation therapy  - Start gabapentin 100mg  at bedtime x 1 week, then increase to 100mg  BID.  Dose renally.  - Consider Cymbalta going forward  2.  Multifactorial lumbosacral canal stenosis with neurogenic claudication, causing gait difficulty - Previously seen Dr. Louanne Skye and declined surgery  Return to clinic in 4 months.    Thank you for allowing me to participate in patient's care.  If I can answer any additional questions, I would be pleased to do so.    Sincerely,    Lakevia Perris K. Posey Pronto, DO

## 2021-05-22 DIAGNOSIS — Z7901 Long term (current) use of anticoagulants: Secondary | ICD-10-CM | POA: Diagnosis not present

## 2021-06-08 DIAGNOSIS — Z7901 Long term (current) use of anticoagulants: Secondary | ICD-10-CM | POA: Diagnosis not present

## 2021-06-13 DIAGNOSIS — Z192 Hormone resistant malignancy status: Secondary | ICD-10-CM | POA: Diagnosis not present

## 2021-06-15 ENCOUNTER — Other Ambulatory Visit: Payer: Self-pay

## 2021-06-15 ENCOUNTER — Ambulatory Visit: Payer: Medicare Other | Admitting: Podiatry

## 2021-06-15 ENCOUNTER — Encounter: Payer: Self-pay | Admitting: Podiatry

## 2021-06-15 DIAGNOSIS — M79674 Pain in right toe(s): Secondary | ICD-10-CM

## 2021-06-15 DIAGNOSIS — M79675 Pain in left toe(s): Secondary | ICD-10-CM

## 2021-06-15 DIAGNOSIS — E1142 Type 2 diabetes mellitus with diabetic polyneuropathy: Secondary | ICD-10-CM

## 2021-06-15 DIAGNOSIS — G629 Polyneuropathy, unspecified: Secondary | ICD-10-CM | POA: Insufficient documentation

## 2021-06-15 DIAGNOSIS — B351 Tinea unguium: Secondary | ICD-10-CM | POA: Diagnosis not present

## 2021-06-15 DIAGNOSIS — R296 Repeated falls: Secondary | ICD-10-CM | POA: Insufficient documentation

## 2021-06-18 NOTE — Progress Notes (Signed)
Subjective: [name] is a pleasant 87 y.o. male patient seen today painful thick toenails that are difficult to trim. Pain interferes with ambulation. Aggravating factors include wearing enclosed shoe gear. Pain is relieved with periodic professional debridement.  He has h/o diabetic neuropathy. He voices no new pedal problems on today's visit. States his great toes have been feeling fine lately and he has not had ingrown toenail pain.  PCP is [name], MD. Last visit was: 02/09/2021.  Allergies  Allergen Reactions   Amoxicillin Other (See Comments)    Burning of mouth, tongue and lips.    Objective: Physical Exam  General: [name] is a pleasant 87 y.o. Caucasian male, in NAD. AAO x 3.   Vascular:  Capillary refill time to digits immediate b/l. Palpable pedal pulses b/l LE. Pedal hair absent. Lower extremity skin temperature gradient within normal limits. No pain with calf compression b/l. Trace edema noted b/l lower extremities.  Dermatological:  Pedal skin with normal turgor, texture and tone b/l lower extremities. No open wounds b/l lower extremities. No interdigital macerations b/l lower extremities. Toenails 1-5 b/l elongated, discolored, dystrophic, thickened, crumbly with subungual debris and tenderness to dorsal palpation.  Musculoskeletal:  Normal muscle strength 5/5 to all lower extremity muscle groups bilaterally. No pain crepitus or joint limitation noted with ROM b/l lower extremities. No gross bony deformities b/l lower extremities. Utilizes cane for ambulation assistance.  Neurological:  Pt has subjective symptoms of neuropathy. Protective sensation diminished with 10g monofilament b/l.  Assessment and Plan:  1. Pain due to onychomycosis of toenails of both feet   2. Diabetic peripheral neuropathy associated with type 2 diabetes mellitus ([place])      -Examined patient. -No new findings. No new orders. -Continue diabetic foot care principles:  inspect feet daily, monitor glucose as recommended by PCP and/or Endocrinologist, and follow prescribed diet per PCP, Endocrinologist and/or dietician. -Patient to continue soft, supportive shoe gear daily. -Toenails 1-5 b/l were debrided in length and girth with sterile nail nippers and dremel without iatrogenic bleeding.  -Patient to report any pedal injuries to medical professional immediately. -Patient/POA to call should there be question/concern in the interim.  Return in about 3 months (around 09/15/2021).  Marzetta Board, DPM

## 2021-06-20 DIAGNOSIS — C775 Secondary and unspecified malignant neoplasm of intrapelvic lymph nodes: Secondary | ICD-10-CM | POA: Diagnosis not present

## 2021-06-21 DIAGNOSIS — I4891 Unspecified atrial fibrillation: Secondary | ICD-10-CM | POA: Diagnosis not present

## 2021-06-21 DIAGNOSIS — I700 Atherosclerosis of aorta: Secondary | ICD-10-CM | POA: Diagnosis not present

## 2021-06-21 DIAGNOSIS — E1140 Type 2 diabetes mellitus with diabetic neuropathy, unspecified: Secondary | ICD-10-CM | POA: Diagnosis not present

## 2021-06-21 DIAGNOSIS — Z7984 Long term (current) use of oral hypoglycemic drugs: Secondary | ICD-10-CM | POA: Diagnosis not present

## 2021-06-21 DIAGNOSIS — I10 Essential (primary) hypertension: Secondary | ICD-10-CM | POA: Diagnosis not present

## 2021-06-21 DIAGNOSIS — D649 Anemia, unspecified: Secondary | ICD-10-CM | POA: Diagnosis not present

## 2021-06-21 DIAGNOSIS — Z0000 Encounter for general adult medical examination without abnormal findings: Secondary | ICD-10-CM | POA: Diagnosis not present

## 2021-06-21 DIAGNOSIS — I5032 Chronic diastolic (congestive) heart failure: Secondary | ICD-10-CM | POA: Diagnosis not present

## 2021-06-21 DIAGNOSIS — D6869 Other thrombophilia: Secondary | ICD-10-CM | POA: Diagnosis not present

## 2021-06-21 DIAGNOSIS — E785 Hyperlipidemia, unspecified: Secondary | ICD-10-CM | POA: Diagnosis not present

## 2021-06-29 DIAGNOSIS — Z7901 Long term (current) use of anticoagulants: Secondary | ICD-10-CM | POA: Diagnosis not present

## 2021-07-02 DIAGNOSIS — R799 Abnormal finding of blood chemistry, unspecified: Secondary | ICD-10-CM | POA: Diagnosis not present

## 2021-07-06 DIAGNOSIS — Z7901 Long term (current) use of anticoagulants: Secondary | ICD-10-CM | POA: Diagnosis not present

## 2021-07-12 ENCOUNTER — Other Ambulatory Visit: Payer: Self-pay | Admitting: Internal Medicine

## 2021-07-12 DIAGNOSIS — Z23 Encounter for immunization: Secondary | ICD-10-CM | POA: Diagnosis not present

## 2021-07-13 DIAGNOSIS — Z7901 Long term (current) use of anticoagulants: Secondary | ICD-10-CM | POA: Diagnosis not present

## 2021-07-20 DIAGNOSIS — Z7901 Long term (current) use of anticoagulants: Secondary | ICD-10-CM | POA: Diagnosis not present

## 2021-07-24 ENCOUNTER — Other Ambulatory Visit: Payer: Self-pay | Admitting: Internal Medicine

## 2021-07-26 DIAGNOSIS — Z7901 Long term (current) use of anticoagulants: Secondary | ICD-10-CM | POA: Diagnosis not present

## 2021-07-27 ENCOUNTER — Ambulatory Visit: Payer: Medicare Other | Admitting: Internal Medicine

## 2021-07-27 ENCOUNTER — Other Ambulatory Visit: Payer: Self-pay

## 2021-07-27 VITALS — BP 142/64 | HR 89 | Ht 68.5 in | Wt 166.4 lb

## 2021-07-27 DIAGNOSIS — I10 Essential (primary) hypertension: Secondary | ICD-10-CM

## 2021-07-27 DIAGNOSIS — I4811 Longstanding persistent atrial fibrillation: Secondary | ICD-10-CM

## 2021-07-27 MED ORDER — VERAPAMIL HCL ER 180 MG PO TBCR: 180.0000 mg | EXTENDED_RELEASE_TABLET | Freq: Every day | ORAL | 3 refills | Status: DC

## 2021-07-27 NOTE — Progress Notes (Signed)
HPI Mr. Ernster returns today for ongoing followup of aortic stenosis s/p TAVR, persistent atrial fib, and chronic diastolic heart failure. He has undergone treatment for prostate CA and has difficulty with nocturia.In the interim, he has been seen by Dr. Gasper Sells. No chest pain or sob. His peripheral edema. He has some pulmonary HTN. He denies syncope. In addition he has had some diarrhea. Allergies  Allergen Reactions   Amoxicillin Other (See Comments)    Burning of mouth, tongue and lips.     Current Outpatient Medications  Medication Sig Dispense Refill   ACCU-CHEK AVIVA PLUS test strip USE TO CHECK BLOOD SUGARS 1 TO 2 TIMES A DAY  3   ACCU-CHEK SOFTCLIX LANCETS lancets daily. use as directed  3   Ascorbic Acid (VITAMIN C) 1000 MG tablet Take 1,000 mg by mouth 2 (two) times daily.     B Complex Vitamins (B COMPLEX 50) TABS Take 1 tablet by mouth daily.      carvedilol (COREG) 12.5 MG tablet Take 1 tablet by mouth twice daily 180 tablet 1   cholecalciferol (VITAMIN D) 1000 UNITS tablet Take 1,000 Units by mouth 2 (two) times daily.     cholecalciferol (VITAMIN D3) 25 MCG (1000 UNIT) tablet      clindamycin (CLEOCIN) 300 MG capsule Take 2 capsules (600 mg total) by mouth as needed. Take 1 hour prior to dental work 30 capsule 0   Cyanocobalamin (VITAMIN B-12 CR) 1000 MCG TBCR      dapagliflozin propanediol (FARXIGA) 10 MG TABS tablet Take 1 tablet (10 mg total) by mouth daily before breakfast. 90 tablet 3   doxazosin (CARDURA) 8 MG tablet Take 8 mg by mouth daily.     ferrous sulfate 325 (65 FE) MG tablet Take 325 mg by mouth daily.     gabapentin (NEURONTIN) 100 MG capsule Take 1 capsule (100 mg total) by mouth at bedtime. 30 capsule 3   glipiZIDE (GLUCOTROL) 5 MG tablet Take 5 mg by mouth 2 (two) times daily before a meal.     lisinopril (ZESTRIL) 20 MG tablet Take 1 tablet by mouth once daily 90 tablet 3   metFORMIN (GLUCOPHAGE) 1000 MG tablet Take 1 tablet by mouth 2  (two) times daily with a meal.     pantoprazole (PROTONIX) 40 MG tablet Take 1 tablet by mouth daily.     rosuvastatin (CRESTOR) 5 MG tablet TAKE 1 TABLET BY MOUTH ONCE DAILY AT  6PM 90 tablet 3   Selenium 200 MCG CAPS Take 200 mcg by mouth every morning.     verapamil (CALAN-SR) 180 MG CR tablet Take 1 tablet (180 mg total) by mouth at bedtime. 90 tablet 3   vitamin A 8000 UNIT capsule Take 8,000 Units by mouth every morning.     vitamin B-12 (CYANOCOBALAMIN) 1000 MCG tablet Take 1,000 mcg by mouth 2 (two) times daily.     vitamin C (ASCORBIC ACID) 500 MG tablet      warfarin (COUMADIN) 2.5 MG tablet Take 2.5 mg by mouth as directed.     warfarin (COUMADIN) 5 MG tablet      XTANDI 40 MG capsule Take 160 mg by mouth daily.     zinc gluconate 50 MG tablet Take 50 mg by mouth every morning.     furosemide (LASIX) 40 MG tablet Take 1 tablet (40 mg total) by mouth daily. 90 tablet 3   No current facility-administered medications for this visit.   Facility-Administered Medications Ordered  in Other Visits  Medication Dose Route Frequency Provider Last Rate Last Admin   norepinephrine (LEVOPHED) 8 mg in dextrose 5 % 250 mL infusion  2-50 mcg/min Intravenous Titrated [name], MD         Past Medical History:  Diagnosis Date   Anemia    LOW PLATELETS OTHER DAY  PER PT   Anticoagulated on Coumadin 12/21/2012   Aortic stenosis    Arthritis    "left wrist; back sometimes" (01/21/2013)   Atrial fibrillation ([place])    GREG Osman [place], DR COOPER   Bradycardia    Exertional shortness of breath    Heart murmur    "I've had it for years; runs in the family on daddy's side" (01/21/2013)   HTN (hypertension)    Hyperlipemia 12/21/2012   Prostate cancer ([place])    "had 40 tx of radiation in 2009" (01/21/2013)   S/P aortic valve replacement with bioprosthetic valve 01/19/2013   Transcatheter Aortic Valve Replacement using 2mm Sapien bioprosthetic tissue valve via transapical approach   Type II  diabetes mellitus ([place])     ROS:   All systems reviewed and negative except as noted in the HPI.   Past Surgical History:  Procedure Laterality Date   BALLOON DILATION N/A 08/02/2020   Procedure: BALLOON DILATION;  Surgeon: [name], MD;  Location: [facility];  Service: Gastroenterology;  Laterality: N/A;   CARDIAC CATHETERIZATION  12/16/2012   CARDIAC VALVE REPLACEMENT  01/19/2013   AVR   CATARACT EXTRACTION W/ INTRAOCULAR LENS  IMPLANT, BILATERAL     ESOPHAGOGASTRODUODENOSCOPY (EGD) WITH PROPOFOL N/A 08/02/2020   Procedure: ESOPHAGOGASTRODUODENOSCOPY (EGD) WITH PROPOFOL;  Surgeon: [name], MD;  Location: [facility];  Service: Gastroenterology;  Laterality: N/A;   EYE SURGERY     INTRAOPERATIVE TRANSESOPHAGEAL ECHOCARDIOGRAM N/A 01/19/2013   Procedure: INTRAOPERATIVE TRANSESOPHAGEAL ECHOCARDIOGRAM;  Surgeon: [name], MD;  Location: [facility];  Service: Open Heart Surgery;  Laterality: N/A;   ORIF FOREARM FRACTURE Left 1954   "compound fx" (01/21/2013)   TRANSTHORACIC ECHOCARDIOGRAM  09/04/10, 09/07/08     Family History  Problem Relation Age of Onset   Diabetes Mother    Melanoma Mother 55   Bone cancer Mother 19   Pancreatic cancer Father 104   Heart disease Father    Stroke Sister    Cancer Brother 13       ? brain cancer     Social History   Socioeconomic History   Marital status: Married    Spouse name: Not on file   Number of children: Not on file   Years of education: Not on file   Highest education level: Not on file  Occupational History   Not on file  Tobacco Use   Smoking status: Former    Packs/day: 2.00    Years: 22.00    Pack years: 44.00    Types: Cigarettes    Quit date: 09/11/1976    Years since quitting: 44.9   Smokeless tobacco: Never  Substance and Sexual Activity   Alcohol use: No   Drug use: No   Sexual activity: Never  Other Topics Concern   Not on file  Social History Narrative   Not on file   Social Determinants of  Health   Financial Resource Strain: Not on file  Food Insecurity: Not on file  Transportation Needs: Not on file  Physical Activity: Not on file  Stress: Not on file  Social Connections: Not on file  Intimate Partner Violence: Not  on file     BP (!) 142/64   Pulse 89   Ht 5' 8.5" (1.74 m)   Wt 166 lb 6.4 oz (75.5 kg)   SpO2 97%   BMI 24.93 kg/m   Physical Exam:  Well appearing NAD HEENT: Unremarkable Neck:  6 cm JVD, no thyromegally Lymphatics:  No adenopathy Back:  No CVA tenderness Lungs:  Clear with no wheezes HEART:  Regular rate rhythm, no murmurs, no rubs, no clicks Abd:  soft, positive bowel sounds, no organomegally, no rebound, no guarding Ext:  2 plus pulses, no edema, no cyanosis, no clubbing Skin:  No rashes no nodules Neuro:  CN II through XII intact, motor grossly intact  EKG - atrial fib with a controlled VR  Assess/Plan:  1. Persistent atrial fib - his rate is well controlled. He is asymptomatic. 2. coags - he will continue warfarin; no bleeding 3. HTN - his SBP is up a bit. He is encouraged to maintain a low sodium diet. 4. Diastolic heart failure - his symptoms are well controlled class. He is encouraged to maintain a low sodium diet. His peripheral edema is worse and I asked to stop amlodipine and start verapamil.   Carleene Overlie Wendelyn Kiesling,MD

## 2021-07-27 NOTE — Patient Instructions (Addendum)
Medication Instructions:  Your physician has recommended you make the following change in your medication:    STOP Amlodipine  2.    START taking verapamil SR 180 mg capsule-  Take one tablet by mouth daily  Labwork: None ordered.  Testing/Procedures: None ordered.  Follow-Up: Your physician wants you to follow-up in: one year with Cristopher Peru, MD or one of the following Advanced Practice Providers on your designated Care Team:   Tommye Standard, [place] Legrand Como "Jonni Sanger" Chalmers Cater, [place]   Any Other Special Instructions Will Be Listed Below (If Applicable).  If you need a refill on your cardiac medications before your next appointment, please call your pharmacy.    Verapamil Sustained-Release Capsules What is this medication? VERAPAMIL (ver AP a mil) treats high blood pressure and prevents chest pain (angina). It works by relaxing the blood vessels, which helps decrease the amount of work your heart has to do. It belongs to a group of medications called calcium channel blockers. This medicine may be used for other purposes; ask your health care provider or pharmacist if you have questions. COMMON BRAND NAME(S): Verelan, Verelan PM What should I tell my care team before I take this medication? They need to know if you have any of these conditions: Duchenne muscular dystrophy Heart disease Irregular heartbeat or rhythm Liver disease Low blood pressure An unusual or allergic reaction to verapamil, other medications, foods, dyes or preservatives Pregnant or trying to get pregnant Breast-feeding How should I use this medication? Take this medication by mouth with water. Take it as directed on the prescription label at the same time every day. Do not cut, crush or chew this medication. Swallow the capsules whole. You may open the capsule and put the contents in 1 teaspoon of applesauce. Swallow the medication and applesauce right away. Do not chew the medication or applesauce. Keep taking it  unless your care team tells you to stop. Do not take this medication with grapefruit juice. Talk to your care team about the use of this medication in children. Special care may be needed. Overdosage: If you think you have taken too much of this medicine contact a poison control center or emergency room at once. NOTE: This medicine is only for you. Do not share this medicine with others. What if I miss a dose? If you miss a dose, take it as soon as you can. If it is almost time for your next dose, take only that dose. Do not take double or extra doses. What may interact with this medication? Do not take this medication with any of the following: Cisapride Disopyramide Dofetilide Grapefruit juice Hawthorn Pimozide Red yeast rice This medication may also interact with the following: Barbiturates such as phenobarbital Cimetidine Cyclosporine Lithium Local anesthetics or general anesthetics Medications for heart rhythm problems like amiodarone, digoxin, flecainide, procainamide, quinidine Medications for high blood pressure or heart problems Medications for seizures like carbamazepine and phenytoin Rifampin, rifabutin or rifapentine Theophylline or aminophylline This list may not describe all possible interactions. Give your health care provider a list of all the medicines, herbs, non-prescription drugs, or dietary supplements you use. Also tell them if you smoke, drink alcohol, or use illegal drugs. Some items may interact with your medicine. What should I watch for while using this medication? Visit your care team for regular checks on your progress. Check your blood pressure as directed. Ask your care team what your blood pressure should be. Also, find out when you should contact them. Do not treat  yourself for coughs, colds, or pain while you are using this medication without asking your care team for advice. Some medications may increase your blood pressure. You may get drowsy or  dizzy. Do not drive, use machinery, or do anything that needs mental alertness until you know how this medication affects you. Do not stand up or sit up quickly, especially if you are an older patient. This reduces the risk of dizzy or fainting spells. What side effects may I notice from receiving this medication? Side effects that you should report to your care team as soon as possible: Allergic reactions-skin rash, itching, hives, swelling of the face, lips, tongue, or throat Heart failure-shortness of breath, swelling of the ankles, feet, or hands, sudden weight gain, unusual weakness or fatigue Slow heartbeat-dizziness, feeling faint or lightheaded, trouble breathing, unusual weakness or fatigue Liver injury-right upper belly pain, loss of appetite, nausea, light-colored stool, dark yellow or brown urine, yellowing skin or eyes, unusual weakness or fatigue Low blood pressure-dizziness, feeling faint or lightheaded, blurry vision Side effects that usually do not require medical attention (report to your care team if they continue or are bothersome): Constipation Dizziness Headache Nausea This list may not describe all possible side effects. Call your doctor for medical advice about side effects. You may report side effects to FDA at 1-800-FDA-1088. Where should I keep my medication? Keep out of the reach of children and pets. Store at room temperature between 20 and 25 degrees C (68 and 77 degrees F). Protect from moisture. Keep the container tightly closed. Avoid exposure to extreme heat. Throw away any unused medication after the expiration date. NOTE: This sheet is a summary. It may not cover all possible information. If you have questions about this medicine, talk to your doctor, pharmacist, or health care provider.  2022 [employer]vier/[employer] (2020-10-31 09:40:28)

## 2021-08-02 DIAGNOSIS — Z7901 Long term (current) use of anticoagulants: Secondary | ICD-10-CM | POA: Diagnosis not present

## 2021-08-09 ENCOUNTER — Other Ambulatory Visit: Payer: Self-pay | Admitting: Internal Medicine

## 2021-08-09 DIAGNOSIS — Z7901 Long term (current) use of anticoagulants: Secondary | ICD-10-CM | POA: Diagnosis not present

## 2021-08-16 DIAGNOSIS — Z7901 Long term (current) use of anticoagulants: Secondary | ICD-10-CM | POA: Diagnosis not present

## 2021-08-17 ENCOUNTER — Other Ambulatory Visit (HOSPITAL_BASED_OUTPATIENT_CLINIC_OR_DEPARTMENT_OTHER): Payer: Self-pay | Admitting: Cardiovascular Disease

## 2021-08-17 DIAGNOSIS — I6521 Occlusion and stenosis of right carotid artery: Secondary | ICD-10-CM

## 2021-08-23 DIAGNOSIS — Z7901 Long term (current) use of anticoagulants: Secondary | ICD-10-CM | POA: Diagnosis not present

## 2021-08-28 DIAGNOSIS — E11319 Type 2 diabetes mellitus with unspecified diabetic retinopathy without macular edema: Secondary | ICD-10-CM | POA: Diagnosis not present

## 2021-08-28 DIAGNOSIS — E1140 Type 2 diabetes mellitus with diabetic neuropathy, unspecified: Secondary | ICD-10-CM | POA: Diagnosis not present

## 2021-08-28 DIAGNOSIS — I10 Essential (primary) hypertension: Secondary | ICD-10-CM | POA: Diagnosis not present

## 2021-08-28 DIAGNOSIS — M1990 Unspecified osteoarthritis, unspecified site: Secondary | ICD-10-CM | POA: Diagnosis not present

## 2021-08-28 DIAGNOSIS — I5032 Chronic diastolic (congestive) heart failure: Secondary | ICD-10-CM | POA: Diagnosis not present

## 2021-08-28 DIAGNOSIS — K219 Gastro-esophageal reflux disease without esophagitis: Secondary | ICD-10-CM | POA: Diagnosis not present

## 2021-08-28 DIAGNOSIS — I4891 Unspecified atrial fibrillation: Secondary | ICD-10-CM | POA: Diagnosis not present

## 2021-08-28 DIAGNOSIS — D649 Anemia, unspecified: Secondary | ICD-10-CM | POA: Diagnosis not present

## 2021-08-28 DIAGNOSIS — E785 Hyperlipidemia, unspecified: Secondary | ICD-10-CM | POA: Diagnosis not present

## 2021-09-05 DIAGNOSIS — Z7901 Long term (current) use of anticoagulants: Secondary | ICD-10-CM | POA: Diagnosis not present

## 2021-09-11 DIAGNOSIS — Z7901 Long term (current) use of anticoagulants: Secondary | ICD-10-CM | POA: Diagnosis not present

## 2021-09-18 DIAGNOSIS — Z7901 Long term (current) use of anticoagulants: Secondary | ICD-10-CM | POA: Diagnosis not present

## 2021-09-21 ENCOUNTER — Ambulatory Visit: Payer: Medicare Other | Admitting: Neurology

## 2021-09-21 ENCOUNTER — Encounter: Payer: Self-pay | Admitting: Neurology

## 2021-09-21 ENCOUNTER — Other Ambulatory Visit: Payer: Self-pay

## 2021-09-21 ENCOUNTER — Other Ambulatory Visit: Payer: Self-pay | Admitting: Internal Medicine

## 2021-09-21 ENCOUNTER — Ambulatory Visit: Payer: Medicare Other | Admitting: Podiatry

## 2021-09-21 VITALS — BP 153/72 | HR 99 | Ht 68.5 in | Wt 161.0 lb

## 2021-09-21 DIAGNOSIS — M48062 Spinal stenosis, lumbar region with neurogenic claudication: Secondary | ICD-10-CM

## 2021-09-21 DIAGNOSIS — E1142 Type 2 diabetes mellitus with diabetic polyneuropathy: Secondary | ICD-10-CM | POA: Diagnosis not present

## 2021-09-21 MED ORDER — GABAPENTIN 100 MG PO CAPS: 100.0000 mg | ORAL_CAPSULE | Freq: Every day | ORAL | 3 refills | Status: DC

## 2021-09-21 NOTE — Patient Instructions (Addendum)
Please follow-up with Dr. Louanne Skye for your hip instability  Continue to use walker  Continue gabapentin 100mg  daily  Return to clinic in 1 year

## 2021-09-21 NOTE — Progress Notes (Signed)
Follow-up Visit   Date: 09/21/21   [name] MRN: [id number] DOB: Feb 05, 1933   Interim History: [name] is a 87 y.o. right-handed Caucasian male with atrial fibrillation on anticoagulation, aortic stenosis s/p TAVR, HF, PAD, diabetes mellitus, hypertension, hyperlipidemia, GERD, history of prostate cancer, and OA returning to the clinic for follow-up of neuropathy.  The patient was accompanied to the clinic by self.  History of present illness: For several years, he has numbness/tingling of the feet and legs, up to the knees.  Symptoms are constant and worse with prolonged standing and walking. He has difficulty walking to his mailbox which is 100 feet away. He has Walks with a cane for the past two years.  He has fallen four times this year.  Fortunately, no severe injuries.  He is on anticoulaguation therapy. He has known lumbar canal stenosis at L2-3 and L4-5 and seen Dr. Louanne Skye for lumbar canal stenosis. He was offered surgery and declined.  He is the primary caregiver to his wife who is predominately wheelchair-bound.   He previously worked as a Engineer, structural, drove an IT sales professional, Social research officer, government, and retired from [employer], [employer].    UPDATE 09/21/2021:   He has a fall a few weeks ago and bruised his right arm. Since this time, he has been compliant with using a walker.  He attributes his fall to imbalance and leg weakness. He did not go to the ER. He reported marked benefit in pain since starting gabapentin 100mg  at bedtime and is requesting refills.  His wife is hospitalized following a fall and has fractured her feet, so will need rehab stay. He is trying to coordinate her care and prefer to hold on doing any therapy until later. No problems with driving.  Medications:  Current Outpatient Medications on File Prior to Visit  Medication Sig Dispense Refill   ACCU-CHEK AVIVA PLUS test strip USE TO CHECK BLOOD SUGARS 1 TO 2 TIMES A DAY  3   ACCU-CHEK  SOFTCLIX LANCETS lancets daily. use as directed  3   Ascorbic Acid (VITAMIN C) 1000 MG tablet Take 1,000 mg by mouth 2 (two) times daily.     B Complex Vitamins (B COMPLEX 50) TABS Take 1 tablet by mouth daily.      carvedilol (COREG) 12.5 MG tablet Take 1 tablet by mouth twice daily 180 tablet 1   cholecalciferol (VITAMIN D) 1000 UNITS tablet Take 1,000 Units by mouth 2 (two) times daily.     cholecalciferol (VITAMIN D3) 25 MCG (1000 UNIT) tablet      clindamycin (CLEOCIN) 300 MG capsule Take 2 capsules (600 mg total) by mouth as needed. Take 1 hour prior to dental work 30 capsule 0   Cyanocobalamin (VITAMIN B-12 CR) 1000 MCG TBCR      dapagliflozin propanediol (FARXIGA) 10 MG TABS tablet Take 1 tablet (10 mg total) by mouth daily before breakfast. 90 tablet 3   doxazosin (CARDURA) 8 MG tablet Take 8 mg by mouth daily.     ferrous sulfate 325 (65 FE) MG tablet Take 325 mg by mouth daily.     furosemide (LASIX) 40 MG tablet Take 1 tablet (40 mg total) by mouth daily. 90 tablet 3   gabapentin (NEURONTIN) 100 MG capsule Take 1 capsule (100 mg total) by mouth at bedtime. 30 capsule 3   glipiZIDE (GLUCOTROL) 5 MG tablet Take 5 mg by mouth 2 (two) times daily before a meal.     lisinopril (ZESTRIL) 20 MG  tablet Take 1 tablet by mouth once daily 90 tablet 3   metFORMIN (GLUCOPHAGE) 1000 MG tablet Take 1 tablet by mouth 2 (two) times daily with a meal.     pantoprazole (PROTONIX) 40 MG tablet Take 1 tablet by mouth daily.     rosuvastatin (CRESTOR) 5 MG tablet TAKE 1 TABLET BY MOUTH ONCE DAILY AT  6PM 90 tablet 3   Selenium 200 MCG CAPS Take 200 mcg by mouth every morning.     verapamil (CALAN-SR) 180 MG CR tablet Take 1 tablet (180 mg total) by mouth at bedtime. 90 tablet 3   vitamin A 8000 UNIT capsule Take 8,000 Units by mouth every morning.     vitamin B-12 (CYANOCOBALAMIN) 1000 MCG tablet Take 1,000 mcg by mouth 2 (two) times daily.     vitamin C (ASCORBIC ACID) 500 MG tablet      warfarin  (COUMADIN) 2.5 MG tablet Take 2.5 mg by mouth as directed.     warfarin (COUMADIN) 5 MG tablet      XTANDI 40 MG capsule Take 160 mg by mouth daily.     zinc gluconate 50 MG tablet Take 50 mg by mouth every morning.     Current Facility-Administered Medications on File Prior to Visit  Medication Dose Route Frequency Provider Last Rate Last Admin   norepinephrine (LEVOPHED) 8 mg in dextrose 5 % 250 mL infusion  2-50 mcg/min Intravenous Titrated [name], MD        Allergies:  Allergies  Allergen Reactions   Amoxicillin Other (See Comments)    Burning of mouth, tongue and lips.    Vital Signs:  BP (!) 153/72   Pulse 99   Ht 5' 8.5" (1.74 m)   Wt 161 lb (73 kg)   SpO2 98%   BMI 24.12 kg/m     Neurological Exam: MENTAL STATUS including orientation to time, place, person, recent and remote memory, attention span and concentration, language, and fund of knowledge is normal.  Speech is not dysarthric.  CRANIAL NERVES:  No visual field defects.  Pupils equal round and reactive to light.  Normal conjugate, extra-ocular eye movements in all directions of gaze.  No ptosis   MOTOR:  RUE 5/5,LUE limited by shoulder pain, distally 5/5.   RLE 4/5 at hip flexors, 5-/5 distally.  LLE 5/5 proximally and 5-/5 distally.  No pronator drift.  Tone is normal.    MSRs:  Reflexes are 2+/4 throughout, except absent at the ankles.  SENSORY:  Intact to vibration at MCP and knees, absent below the ankles.Marland Kitchen  COORDINATION/GAIT:  Trendelenburg gait, unsteady, assisted with walker.   Data: MRI lumbar spine wo contrast 05/22/2019: 12 mm marrow space focus in the superior L1 vertebral body. In a patient with a history of prostate cancer, this could represent a metastatic deposit. However, this is an isolated finding which would be somewhat unusual.   L1-2: 3 mm retrolisthesis. Mild bulging of the disc. Mild stenosis of the lateral recesses and foramina but without definite neural compression.    L2-3: Multifactorial spinal stenosis that could cause neural compression on either or both sides. 3 mm retrolisthesis. Bulging of the disc. Facet and ligamentous hypertrophy. Discogenic edema on the right that could be associated with back pain.   L3-4: Bilateral lateral recess and foraminal narrowing that could cause neural compression on either side. Foraminal narrowing more marked on the right. Endplate osteophytes and bulging of the disc in combination with facet hypertrophy.   L4-5: Multifactorial  spinal stenosis that could cause neural compression on either or both sides. Bilateral facet arthropathy with 3 mm of anterolisthesis. Mild bulging of the disc.   L5-S1: Endplate osteophytes and bulging of the disc. Facet osteoarthritis. Mild bilateral foraminal narrowing but without definite neural compression.  IMPRESSION/PLAN: Peripheral neuropathy, contributed by diabetes, manifesting with distal paresthesias, weakness, and ataxia  - Painful paresthesias improved on gabapentin 100mg  at bedtime  - Patient educated on daily foot inspection, fall prevention, and safety precautions around the home.  2.  Multifactorial lumbosacral canal stenosis at L2-3 and L4-5 with neurogenic claudication and trendelenburg gait - Previously seen Dr. Louanne Skye and declined surgery   - PT declined  - Continue to use walker  - Fall precautions discussed at length as he is on anticoagulation therapy  Return to clinic in 1 year or sooner as needed.   Total time spent reviewing records, interview, history/exam, documentation, and coordination of care on day of encounter:  25 min    Thank you for allowing me to participate in patient's care.  If I can answer any additional questions, I would be pleased to do so.    Sincerely,    Kaylib Furness K. Posey Pronto, DO

## 2021-09-24 ENCOUNTER — Ambulatory Visit: Payer: Medicare Other | Admitting: Podiatry

## 2021-09-24 ENCOUNTER — Encounter: Payer: Self-pay | Admitting: Podiatry

## 2021-09-24 ENCOUNTER — Other Ambulatory Visit: Payer: Self-pay

## 2021-09-24 VITALS — BP 110/58 | HR 75 | Temp 98.0°F

## 2021-09-24 DIAGNOSIS — S81801A Unspecified open wound, right lower leg, initial encounter: Secondary | ICD-10-CM | POA: Diagnosis not present

## 2021-09-24 DIAGNOSIS — E1142 Type 2 diabetes mellitus with diabetic polyneuropathy: Secondary | ICD-10-CM | POA: Diagnosis not present

## 2021-09-24 DIAGNOSIS — W19XXXA Unspecified fall, initial encounter: Secondary | ICD-10-CM

## 2021-09-24 DIAGNOSIS — B351 Tinea unguium: Secondary | ICD-10-CM | POA: Diagnosis not present

## 2021-09-24 DIAGNOSIS — I10 Essential (primary) hypertension: Secondary | ICD-10-CM | POA: Insufficient documentation

## 2021-09-24 DIAGNOSIS — M79674 Pain in right toe(s): Secondary | ICD-10-CM

## 2021-09-24 DIAGNOSIS — M79675 Pain in left toe(s): Secondary | ICD-10-CM

## 2021-09-24 DIAGNOSIS — S81802A Unspecified open wound, left lower leg, initial encounter: Secondary | ICD-10-CM

## 2021-09-24 DIAGNOSIS — I700 Atherosclerosis of aorta: Secondary | ICD-10-CM | POA: Insufficient documentation

## 2021-09-24 NOTE — Patient Instructions (Signed)
WE ARE ADVISING YOU TO GO TO HOSPITAL FOR ASSESSMENT OF HEAD INJURY.  I HAVE SENT REFERRAL TO WOUND CARE FOR YOUR LEG AND ELBOW WOUNDS.

## 2021-09-25 DIAGNOSIS — Z7901 Long term (current) use of anticoagulants: Secondary | ICD-10-CM | POA: Diagnosis not present

## 2021-09-25 NOTE — Progress Notes (Signed)
  Subjective:  Patient ID: [name], male    DOB: 09-30-1933,  MRN: [id number]  [name] presents to clinic today for at risk foot care with history of diabetic neuropathy and painful elongated mycotic toenails 1-5 bilaterally which are tender when wearing enclosed shoe gear. Pain is relieved with periodic professional debridement.  Patient did not check blood glucose today.  Patient states he had a fall in his kitchen on yesterday. States he was soaking his feet. He got up to walk with his feet wet and fell. He states he did hit his head. He called his daughter who came to his home and helped him up. Patient states he never lost consciousness. He is not dizzy. He does not have a headache. He did injure his eye, both legs and right elbow.            His wife is in the hospital and will be transferred to [facility] on tomorrow.  PCP is [name], MD , and last visit was 09/18/2021.  Allergies  Allergen Reactions   Amoxicillin Other (See Comments)    Burning of mouth, tongue and lips. Other reaction(s): itchy, burning throat    Review of Systems: Negative except as noted in the HPI. Objective:   Constitutional [name] is a pleasant 87 y.o. Caucasian male, in NAD. AAO x 3.  Vitals:   09/24/21 0914  BP: (!) 110/58  Pulse: 75  Temp: 98 F (36.7 C)  SpO2: 97%     Vascular Capillary refill time to digits immediate b/l. Palpable DP pulse(s) b/l LE. Pedal hair absent. No pain with calf compression RLE. No ischemia or gangrene noted b/l LE. No cyanosis or clubbing noted b/l LE. No cyanosis or clubbing noted.  Neurologic Normal speech. Oriented to person, place, and time. Pt has subjective symptoms of neuropathy. Protective sensation diminished with 10g monofilament b/l.  Dermatologic Pedal skin thin, shiny and atrophic b/l LE. Skin tear noted anterior aspect of boh lower legs. No active drainage. No odor . No interdigital macerations  noted b/l LE. Toenails 1-5 b/l elongated, discolored, dystrophic, thickened, crumbly with subungual debris and tenderness to dorsal palpation.   Orthopedic: Muscle strength 5/5 to all lower extremity muscle groups bilaterally. No gross bony deformities bilaterally. Utilizes walker for ambulation assistance.   Radiographs: None Assessment:   1. Pain due to onychomycosis of toenails of both feet   2. Fall in home, initial encounter   3. Open wound of left lower leg, initial encounter   4. Open wound of right lower leg, initial encounter   5. Diabetic peripheral neuropathy associated with type 2 diabetes mellitus ([place])    Plan:  Patient was evaluated and treated and all questions answered. Consent given for treatment as described below: -Examined patient. -Advised patient to go to ED for assessment. Patient refuses. Referral sent to Wound Care for wounds b/l legs and right elbow.. -Patient to continue soft, supportive shoe gear daily. -Leg wounds wrapped today b/l. -Mycotic toenails 1-5 bilaterally were debrided in length and girth with sterile nail nippers and dremel without incident. -Patient/POA to call should there be question/concern in the interim.  Return in about 3 months (around 12/25/2021).  Marzetta Board, DPM

## 2021-10-01 ENCOUNTER — Inpatient Hospital Stay (HOSPITAL_COMMUNITY)
Admission: EM | Admit: 2021-10-01 | Discharge: 2021-10-08 | DRG: 380 | Disposition: A | Discharge status: Skilled Nursing Facility | Payer: Medicare Other | Attending: Internal Medicine | Admitting: Internal Medicine

## 2021-10-01 ENCOUNTER — Other Ambulatory Visit: Payer: Self-pay

## 2021-10-01 ENCOUNTER — Emergency Department (HOSPITAL_COMMUNITY): Payer: Medicare Other

## 2021-10-01 ENCOUNTER — Encounter (HOSPITAL_COMMUNITY): Payer: Self-pay

## 2021-10-01 DIAGNOSIS — T45515A Adverse effect of anticoagulants, initial encounter: Secondary | ICD-10-CM | POA: Diagnosis present

## 2021-10-01 DIAGNOSIS — D6832 Hemorrhagic disorder due to extrinsic circulating anticoagulants: Secondary | ICD-10-CM | POA: Diagnosis not present

## 2021-10-01 DIAGNOSIS — M6259 Muscle wasting and atrophy, not elsewhere classified, multiple sites: Secondary | ICD-10-CM | POA: Diagnosis not present

## 2021-10-01 DIAGNOSIS — I110 Hypertensive heart disease with heart failure: Secondary | ICD-10-CM | POA: Diagnosis present

## 2021-10-01 DIAGNOSIS — B9681 Helicobacter pylori [H. pylori] as the cause of diseases classified elsewhere: Secondary | ICD-10-CM | POA: Diagnosis present

## 2021-10-01 DIAGNOSIS — L89153 Pressure ulcer of sacral region, stage 3: Secondary | ICD-10-CM | POA: Diagnosis not present

## 2021-10-01 DIAGNOSIS — K922 Gastrointestinal hemorrhage, unspecified: Secondary | ICD-10-CM

## 2021-10-01 DIAGNOSIS — W19XXXA Unspecified fall, initial encounter: Secondary | ICD-10-CM

## 2021-10-01 DIAGNOSIS — M19032 Primary osteoarthritis, left wrist: Secondary | ICD-10-CM | POA: Diagnosis present

## 2021-10-01 DIAGNOSIS — M6281 Muscle weakness (generalized): Secondary | ICD-10-CM | POA: Diagnosis not present

## 2021-10-01 DIAGNOSIS — K921 Melena: Secondary | ICD-10-CM | POA: Diagnosis not present

## 2021-10-01 DIAGNOSIS — E119 Type 2 diabetes mellitus without complications: Secondary | ICD-10-CM | POA: Diagnosis not present

## 2021-10-01 DIAGNOSIS — Z923 Personal history of irradiation: Secondary | ICD-10-CM | POA: Diagnosis not present

## 2021-10-01 DIAGNOSIS — Z79899 Other long term (current) drug therapy: Secondary | ICD-10-CM | POA: Diagnosis not present

## 2021-10-01 DIAGNOSIS — D649 Anemia, unspecified: Secondary | ICD-10-CM | POA: Diagnosis not present

## 2021-10-01 DIAGNOSIS — Z880 Allergy status to penicillin: Secondary | ICD-10-CM | POA: Diagnosis not present

## 2021-10-01 DIAGNOSIS — Z833 Family history of diabetes mellitus: Secondary | ICD-10-CM | POA: Diagnosis not present

## 2021-10-01 DIAGNOSIS — R58 Hemorrhage, not elsewhere classified: Secondary | ICD-10-CM | POA: Diagnosis not present

## 2021-10-01 DIAGNOSIS — L8990 Pressure ulcer of unspecified site, unspecified stage: Secondary | ICD-10-CM | POA: Diagnosis present

## 2021-10-01 DIAGNOSIS — R2681 Unsteadiness on feet: Secondary | ICD-10-CM | POA: Diagnosis not present

## 2021-10-01 DIAGNOSIS — R140 Abdominal distension (gaseous): Secondary | ICD-10-CM | POA: Diagnosis not present

## 2021-10-01 DIAGNOSIS — E785 Hyperlipidemia, unspecified: Secondary | ICD-10-CM | POA: Diagnosis present

## 2021-10-01 DIAGNOSIS — Z20822 Contact with and (suspected) exposure to covid-19: Secondary | ICD-10-CM | POA: Diagnosis not present

## 2021-10-01 DIAGNOSIS — D689 Coagulation defect, unspecified: Secondary | ICD-10-CM

## 2021-10-01 DIAGNOSIS — Z7901 Long term (current) use of anticoagulants: Secondary | ICD-10-CM | POA: Diagnosis not present

## 2021-10-01 DIAGNOSIS — I4891 Unspecified atrial fibrillation: Secondary | ICD-10-CM | POA: Diagnosis not present

## 2021-10-01 DIAGNOSIS — E46 Unspecified protein-calorie malnutrition: Secondary | ICD-10-CM | POA: Diagnosis not present

## 2021-10-01 DIAGNOSIS — K5900 Constipation, unspecified: Secondary | ICD-10-CM | POA: Diagnosis present

## 2021-10-01 DIAGNOSIS — S81802A Unspecified open wound, left lower leg, initial encounter: Secondary | ICD-10-CM | POA: Diagnosis not present

## 2021-10-01 DIAGNOSIS — I482 Chronic atrial fibrillation, unspecified: Secondary | ICD-10-CM | POA: Diagnosis present

## 2021-10-01 DIAGNOSIS — K259 Gastric ulcer, unspecified as acute or chronic, without hemorrhage or perforation: Secondary | ICD-10-CM | POA: Diagnosis not present

## 2021-10-01 DIAGNOSIS — Z8546 Personal history of malignant neoplasm of prostate: Secondary | ICD-10-CM

## 2021-10-01 DIAGNOSIS — E1140 Type 2 diabetes mellitus with diabetic neuropathy, unspecified: Secondary | ICD-10-CM | POA: Diagnosis not present

## 2021-10-01 DIAGNOSIS — Z87891 Personal history of nicotine dependence: Secondary | ICD-10-CM

## 2021-10-01 DIAGNOSIS — Z953 Presence of xenogenic heart valve: Secondary | ICD-10-CM

## 2021-10-01 DIAGNOSIS — E1151 Type 2 diabetes mellitus with diabetic peripheral angiopathy without gangrene: Secondary | ICD-10-CM | POA: Diagnosis not present

## 2021-10-01 DIAGNOSIS — E11319 Type 2 diabetes mellitus with unspecified diabetic retinopathy without macular edema: Secondary | ICD-10-CM | POA: Diagnosis not present

## 2021-10-01 DIAGNOSIS — R296 Repeated falls: Secondary | ICD-10-CM | POA: Diagnosis not present

## 2021-10-01 DIAGNOSIS — I959 Hypotension, unspecified: Secondary | ICD-10-CM | POA: Diagnosis present

## 2021-10-01 DIAGNOSIS — D62 Acute posthemorrhagic anemia: Secondary | ICD-10-CM | POA: Diagnosis not present

## 2021-10-01 DIAGNOSIS — Z7984 Long term (current) use of oral hypoglycemic drugs: Secondary | ICD-10-CM | POA: Diagnosis not present

## 2021-10-01 DIAGNOSIS — I10 Essential (primary) hypertension: Secondary | ICD-10-CM | POA: Diagnosis present

## 2021-10-01 DIAGNOSIS — S81801A Unspecified open wound, right lower leg, initial encounter: Secondary | ICD-10-CM | POA: Diagnosis not present

## 2021-10-01 DIAGNOSIS — Z8249 Family history of ischemic heart disease and other diseases of the circulatory system: Secondary | ICD-10-CM

## 2021-10-01 DIAGNOSIS — I6782 Cerebral ischemia: Secondary | ICD-10-CM | POA: Diagnosis not present

## 2021-10-01 DIAGNOSIS — Z743 Need for continuous supervision: Secondary | ICD-10-CM | POA: Diagnosis not present

## 2021-10-01 DIAGNOSIS — K449 Diaphragmatic hernia without obstruction or gangrene: Secondary | ICD-10-CM | POA: Diagnosis present

## 2021-10-01 DIAGNOSIS — K2211 Ulcer of esophagus with bleeding: Secondary | ICD-10-CM | POA: Diagnosis not present

## 2021-10-01 DIAGNOSIS — R1312 Dysphagia, oropharyngeal phase: Secondary | ICD-10-CM | POA: Diagnosis not present

## 2021-10-01 DIAGNOSIS — K2970 Gastritis, unspecified, without bleeding: Secondary | ICD-10-CM | POA: Diagnosis not present

## 2021-10-01 DIAGNOSIS — I5032 Chronic diastolic (congestive) heart failure: Secondary | ICD-10-CM | POA: Diagnosis not present

## 2021-10-01 DIAGNOSIS — D500 Iron deficiency anemia secondary to blood loss (chronic): Secondary | ICD-10-CM | POA: Diagnosis not present

## 2021-10-01 DIAGNOSIS — K2951 Unspecified chronic gastritis with bleeding: Secondary | ICD-10-CM | POA: Diagnosis not present

## 2021-10-01 DIAGNOSIS — Z741 Need for assistance with personal care: Secondary | ICD-10-CM | POA: Diagnosis not present

## 2021-10-01 DIAGNOSIS — R42 Dizziness and giddiness: Secondary | ICD-10-CM | POA: Diagnosis not present

## 2021-10-01 DIAGNOSIS — A048 Other specified bacterial intestinal infections: Secondary | ICD-10-CM | POA: Diagnosis not present

## 2021-10-01 DIAGNOSIS — D7589 Other specified diseases of blood and blood-forming organs: Secondary | ICD-10-CM | POA: Diagnosis not present

## 2021-10-01 DIAGNOSIS — R5381 Other malaise: Secondary | ICD-10-CM | POA: Diagnosis present

## 2021-10-01 DIAGNOSIS — R531 Weakness: Secondary | ICD-10-CM | POA: Diagnosis not present

## 2021-10-01 DIAGNOSIS — K254 Chronic or unspecified gastric ulcer with hemorrhage: Secondary | ICD-10-CM | POA: Diagnosis not present

## 2021-10-01 DIAGNOSIS — E1169 Type 2 diabetes mellitus with other specified complication: Secondary | ICD-10-CM

## 2021-10-01 DIAGNOSIS — I517 Cardiomegaly: Secondary | ICD-10-CM | POA: Diagnosis not present

## 2021-10-01 DIAGNOSIS — K222 Esophageal obstruction: Secondary | ICD-10-CM | POA: Diagnosis present

## 2021-10-01 DIAGNOSIS — C61 Malignant neoplasm of prostate: Secondary | ICD-10-CM | POA: Diagnosis present

## 2021-10-01 LAB — URINALYSIS, ROUTINE W REFLEX MICROSCOPIC
Bilirubin Urine: NEGATIVE
Glucose, UA: 150 mg/dL — AB
Hgb urine dipstick: NEGATIVE
Ketones, ur: NEGATIVE mg/dL
Leukocytes,Ua: NEGATIVE
Nitrite: NEGATIVE
Protein, ur: NEGATIVE mg/dL
Specific Gravity, Urine: 1.014 (ref 1.005–1.030)
pH: 5 (ref 5.0–8.0)

## 2021-10-01 LAB — PROTIME-INR
INR: 7.2 (ref 0.8–1.2)
Prothrombin Time: 61.4 seconds — ABNORMAL HIGH (ref 11.4–15.2)

## 2021-10-01 LAB — CBC
HCT: 19.9 % — ABNORMAL LOW (ref 39.0–52.0)
Hemoglobin: 6.4 g/dL — CL (ref 13.0–17.0)
MCH: 32.7 pg (ref 26.0–34.0)
MCHC: 32.2 g/dL (ref 30.0–36.0)
MCV: 101.5 fL — ABNORMAL HIGH (ref 80.0–100.0)
Platelets: 217 10*3/uL (ref 150–400)
RBC: 1.96 MIL/uL — ABNORMAL LOW (ref 4.22–5.81)
RDW: 13.4 % (ref 11.5–15.5)
WBC: 6.6 10*3/uL (ref 4.0–10.5)
nRBC: 0 % (ref 0.0–0.2)

## 2021-10-01 LAB — BASIC METABOLIC PANEL
Anion gap: 9 (ref 5–15)
BUN: 105 mg/dL — ABNORMAL HIGH (ref 8–23)
CO2: 21 mmol/L — ABNORMAL LOW (ref 22–32)
Calcium: 8.9 mg/dL (ref 8.9–10.3)
Chloride: 105 mmol/L (ref 98–111)
Creatinine, Ser: 1.26 mg/dL — ABNORMAL HIGH (ref 0.61–1.24)
GFR, Estimated: 56 mL/min — ABNORMAL LOW (ref >60)
Glucose, Bld: 108 mg/dL — ABNORMAL HIGH (ref 70–99)
Potassium: 4.6 mmol/L (ref 3.5–5.1)
Sodium: 135 mmol/L (ref 135–145)

## 2021-10-01 LAB — PREPARE RBC (CROSSMATCH)

## 2021-10-01 LAB — POC OCCULT BLOOD, ED: Fecal Occult Bld: POSITIVE — AB

## 2021-10-01 LAB — RESP PANEL BY RT-PCR (FLU A&B, COVID) ARPGX2
Influenza A by PCR: NEGATIVE
Influenza B by PCR: NEGATIVE
SARS Coronavirus 2 by RT PCR: NEGATIVE

## 2021-10-01 LAB — CBG MONITORING, ED: Glucose-Capillary: 94 mg/dL (ref 70–99)

## 2021-10-01 MED ORDER — SODIUM CHLORIDE 0.9 % IV SOLN: 10.0000 mL/h | Freq: Once | INTRAVENOUS | Status: DC

## 2021-10-01 MED ORDER — SODIUM CHLORIDE 0.9 % IV SOLN: INTRAVENOUS | Status: DC

## 2021-10-01 MED ORDER — SODIUM CHLORIDE 0.9 % IV BOLUS
500.0000 mL | Freq: Once | INTRAVENOUS | Status: AC
  Administered 2021-10-01: 22:00:00 500 mL via INTRAVENOUS

## 2021-10-01 MED ORDER — VITAMIN K1 10 MG/ML IJ SOLN
10.0000 mg | Freq: Once | INTRAVENOUS | Status: AC
  Administered 2021-10-02: 10 mg via INTRAVENOUS
  Filled 2021-10-01: qty 1

## 2021-10-01 MED ORDER — SODIUM CHLORIDE 0.9 % IV BOLUS
500.0000 mL | Freq: Once | INTRAVENOUS | Status: AC
  Administered 2021-10-01: 500 mL via INTRAVENOUS

## 2021-10-01 MED ORDER — SODIUM CHLORIDE 0.9 % IV BOLUS
250.0000 mL | Freq: Once | INTRAVENOUS | Status: AC
  Administered 2021-10-01: 20:00:00 250 mL via INTRAVENOUS

## 2021-10-01 NOTE — ED Provider Notes (Addendum)
[place] DEPT Provider Note   CSN: [id number] Arrival date & time: 10/01/21  1724     History Chief Complaint  Patient presents with   Abnormal Lab   Weakness    [name] is a 87 y.o. male.  Patient brought in by family member.  Patient went to his primary care doctor today for regular appointment and was told to come in due to low hemoglobin.  Patient denies any obvious bleeding no bleeding from the nose or gums.  Denies any blood in his bowel movements no vomiting.  No abdominal pain no chest pain no shortness of breath.  Patient is on Coumadin.  History of aortic stenosis hypertension hyperlipidemia status post aortic valve replacement with a bioprosthetic valve history of atrial fibrillation history of type 2 diabetes.      Past Medical History:  Diagnosis Date   Anemia    LOW PLATELETS OTHER DAY  PER PT   Anticoagulated on Coumadin 12/21/2012   Aortic stenosis    Arthritis    "left wrist; back sometimes" (01/21/2013)   Atrial fibrillation ([place])    [name], DR COOPER   Bradycardia    Exertional shortness of breath    Heart murmur    "I've had it for years; runs in the family on daddy's side" (01/21/2013)   HTN (hypertension)    Hyperlipemia 12/21/2012   Prostate cancer ([place])    "had 40 tx of radiation in 2009" (01/21/2013)   S/P aortic valve replacement with bioprosthetic valve 01/19/2013   Transcatheter Aortic Valve Replacement using 45mm Sapien bioprosthetic tissue valve via transapical approach   Type II diabetes mellitus ([place])     Patient Active Problem List   Diagnosis Date Noted   Hardening of the aorta (main artery of the heart) ([place]) 09/24/2021   Hypertension 09/24/2021   Neuropathy 06/15/2021   Recurrent falls 06/15/2021   Diabetic neuropathy ([place]) 03/09/2021   Diabetic retinopathy ([place]) 03/09/2021   Esophageal stricture 03/09/2021   Other thrombophilia ([place]) 03/09/2021   Prostate cancer ([place]) 03/09/2021    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   Nonrheumatic mitral valve regurgitation 09/11/2020   Nonrheumatic tricuspid valve regurgitation 09/11/2020   PAD (peripheral artery disease) ([place]) 08/22/2017   Absolute anemia 04/02/2016   Diabetes mellitus with coincident hypertension ([place]) 04/02/2016   Long term current use of anticoagulant 04/02/2016   Macrocytosis 04/02/2016   Arthritis, degenerative 04/02/2016   History of colon polyps 04/02/2016   Carcinoma of prostate ([place]) 04/02/2016   Abdominal discomfort, epigastric 12/20/2015   H/O adenomatous polyp of colon 12/20/2015   Left upper quadrant pain 03/16/2015   Chronic heart failure with preserved ejection fraction ([place]) 08/30/2014   S/P aortic valve replacement with bioprosthetic valve 01/19/2013   Hyperlipemia 12/21/2012   H/O prostate cancer 12/21/2012   Anticoagulated on Coumadin 12/21/2012   HYPERTENSION, CONTROLLED 09/06/2009   ATRIAL FIBRILLATION, CHRONIC 09/06/2009    Past Surgical History:  Procedure Laterality Date   BALLOON DILATION N/A 08/02/2020   Procedure: BALLOON DILATION;  Surgeon: [name], MD;  Location: [facility];  Service: Gastroenterology;  Laterality: N/A;   CARDIAC CATHETERIZATION  12/16/2012   CARDIAC VALVE REPLACEMENT  01/19/2013   AVR   CATARACT EXTRACTION W/ INTRAOCULAR LENS  IMPLANT, BILATERAL     ESOPHAGOGASTRODUODENOSCOPY (EGD) WITH PROPOFOL N/A 08/02/2020   Procedure: ESOPHAGOGASTRODUODENOSCOPY (EGD) WITH PROPOFOL;  Surgeon: [name], MD;  Location: [facility];  Service: Gastroenterology;  Laterality: N/A;  EYE SURGERY     INTRAOPERATIVE TRANSESOPHAGEAL ECHOCARDIOGRAM N/A 01/19/2013   Procedure: INTRAOPERATIVE TRANSESOPHAGEAL ECHOCARDIOGRAM;  Surgeon: [name], MD;  Location: [facility];  Service: Open Heart Surgery;  Laterality: N/A;   ORIF FOREARM FRACTURE Left 1952   "compound fx" (01/21/2013)   TRANSTHORACIC ECHOCARDIOGRAM  09/04/10,  09/07/08       Family History  Problem Relation Age of Onset   Diabetes Mother    Melanoma Mother 18   Bone cancer Mother 79   Pancreatic cancer Father 3   Heart disease Father    Stroke Sister    Cancer Brother 53       ? brain cancer    Social History   Tobacco Use   Smoking status: Former    Packs/day: 2.00    Years: 22.00    Pack years: 44.00    Types: Cigarettes    Quit date: 09/11/1976    Years since quitting: 45.0   Smokeless tobacco: Never  Vaping Use   Vaping Use: Never used  Substance Use Topics   Alcohol use: No   Drug use: No    Home Medications Prior to Admission medications   Medication Sig Start Date End Date Taking? Authorizing Provider  Acetaminophen (TYLENOL ARTHRITIS PAIN PO) Take 650 mg by mouth daily with breakfast.   Yes [provider]  B Complex Vitamins (B COMPLEX 50) TABS Take 1 tablet by mouth daily with breakfast.   Yes [provider]  carvedilol (COREG) 12.5 MG tablet Take 1 tablet by mouth twice daily Patient taking differently: Take 12.5 mg by mouth 2 (two) times daily with breakfast and lunch. 07/12/21  Yes [name], MD  cholecalciferol (VITAMIN D3) 25 MCG (1000 UNIT) tablet Take 1,000 Units by mouth 2 (two) times daily.   Yes [provider]  dapagliflozin propanediol (FARXIGA) 10 MG TABS tablet Take 1 tablet (10 mg total) by mouth daily before breakfast. 12/08/20  Yes Chandrasekhar, Mahesh A, MD  doxazosin (CARDURA) 8 MG tablet Take 8 mg by mouth every evening.   Yes [provider]  enzalutamide (XTANDI) 40 MG capsule Take 160 mg by mouth daily at 2 PM.   Yes [provider]  ferrous sulfate 325 (65 FE) MG tablet Take 325 mg by mouth See admin instructions. Take one tablet (325 mg) by mouth twice daily - with lunch and supper   Yes [provider]  furosemide (LASIX) 40 MG tablet Take 1 tablet (40 mg total) by mouth daily. Patient taking differently: Take 40 mg by mouth daily  with lunch. 10/31/20 11/02/21 Yes Chandrasekhar, Mahesh A, MD  gabapentin (NEURONTIN) 100 MG capsule Take 1 capsule (100 mg total) by mouth at bedtime. 09/21/21  Yes Patel, Donika K, DO  glipiZIDE (GLUCOTROL) 5 MG tablet Take 5 mg by mouth 2 (two) times daily with breakfast and lunch.   Yes [provider]  lisinopril (ZESTRIL) 20 MG tablet Take 1 tablet by mouth once daily Patient taking differently: Take 20 mg by mouth daily with breakfast. 08/10/21  Yes [name], MD  metFORMIN (GLUCOPHAGE) 1000 MG tablet Take 1 tablet by mouth 2 (two) times daily with breakfast and lunch.   Yes [provider]  pantoprazole (PROTONIX) 40 MG tablet Take 40 mg by mouth daily.   Yes [provider]  rosuvastatin (CRESTOR) 5 MG tablet TAKE 1 TABLET BY MOUTH ONCE DAILY AT  6PM Patient taking differently: Take 5 mg by mouth every evening. 07/24/21  Yes Chandrasekhar, Mahesh A, MD  Selenium 200 MCG CAPS Take 200 mcg by mouth daily with breakfast.   Yes [provider]  Vitamin A 2400 MCG (8000 UT) TABS Take 2,400 mcg by mouth daily with breakfast.   Yes [provider]  vitamin B-12 (CYANOCOBALAMIN) 1000 MCG tablet Take 1,000 mcg by mouth 2 (two) times daily.   Yes [provider]  vitamin C (ASCORBIC ACID) 500 MG tablet Take 500 mg by mouth 2 (two) times daily.   Yes [provider]  warfarin (COUMADIN) 5 MG tablet Take 5 mg by mouth See admin instructions. Take one tablet (5 mg) by mouth on Sunday, Tuesday, Wednesday, Friday, Saturday at 7pm (take 7.5 mg tablet on Monday and Thursday)   Yes [provider]  warfarin (COUMADIN) 7.5 MG tablet Take 7.5 mg by mouth See admin instructions. Take one tablet (7.5 mg) by mouth on Monday and Thursday at 7pm (take 5 mg on all other days of the week)   Yes [provider]  zinc gluconate 50 MG tablet Take 50 mg by mouth daily with breakfast.   Yes [provider]  ACCU-CHEK AVIVA PLUS  test strip USE TO CHECK BLOOD SUGARS 1 TO 2 TIMES A DAY 05/23/18   [provider]  ACCU-CHEK SOFTCLIX LANCETS lancets daily. use as directed 04/07/18   [provider]  clindamycin (CLEOCIN) 300 MG capsule Take 2 capsules (600 mg total) by mouth as needed. Take 1 hour prior to dental work Patient not taking: Reported on 10/01/2021 12/01/20   [name], MD  verapamil (CALAN-SR) 180 MG CR tablet Take 1 tablet (180 mg total) by mouth at bedtime. Patient not taking: Reported on 10/01/2021 07/27/21   [name], MD    Allergies    Amoxicillin  Review of Systems   Review of Systems  Constitutional:  Negative for chills and fever.  HENT:  Negative for ear pain and sore throat.   Eyes:  Negative for pain and visual disturbance.  Respiratory:  Negative for cough and shortness of breath.   Cardiovascular:  Negative for chest pain and palpitations.  Gastrointestinal:  Negative for abdominal pain and vomiting.  Genitourinary:  Negative for dysuria and hematuria.  Musculoskeletal:  Negative for arthralgias and back pain.  Skin:  Negative for color change and rash.  Neurological:  Negative for seizures and syncope.  Hematological:  Does not bruise/bleed easily.  All other systems reviewed and are negative.  Physical Exam Updated Vital Signs BP (!) 99/49   Pulse 80   Temp 98.4 F (36.9 C) (Oral)   Resp 16   Ht 1.74 m (5' 8.5")   Wt 73 kg   SpO2 99%   BMI 24.12 kg/m   Physical Exam Vitals and nursing note reviewed.  Constitutional:      General: He is not in acute distress.    Appearance: Normal appearance. He is well-developed.  HENT:     Head: Normocephalic and atraumatic.     Mouth/Throat:     Mouth: Mucous membranes are dry.     Pharynx: Oropharynx is clear.  Eyes:     Extraocular Movements: Extraocular movements intact.     Conjunctiva/sclera: Conjunctivae normal.     Pupils: Pupils are equal, round, and reactive to light.  Cardiovascular:      Rate and Rhythm: Normal rate and regular rhythm.     Heart sounds: No murmur heard. Pulmonary:     Effort: Pulmonary effort is normal.  No respiratory distress.     Breath sounds: Normal breath sounds.  Abdominal:     General: There is no distension.     Palpations: Abdomen is soft.     Tenderness: There is no abdominal tenderness.  Musculoskeletal:        General: No swelling.     Cervical back: Normal range of motion and neck supple.  Skin:    General: Skin is warm and dry.     Capillary Refill: Capillary refill takes less than 2 seconds.     Coloration: Skin is pale.  Neurological:     General: No focal deficit present.     Mental Status: He is alert and oriented to person, place, and time.  Psychiatric:        Mood and Affect: Mood normal.    ED Results / Procedures / Treatments   Labs (all labs ordered are listed, but only abnormal results are displayed) Labs Reviewed  BASIC METABOLIC PANEL - Abnormal; Notable for the following components:      Result Value   CO2 21 (*)    Glucose, Bld 108 (*)    Creatinine, Ser 1.26 (*)    GFR, Estimated 56 (*)    All other components within normal limits  CBC - Abnormal; Notable for the following components:   RBC 1.96 (*)    Hemoglobin 6.4 (*)    HCT 19.9 (*)    MCV 101.5 (*)    All other components within normal limits  URINALYSIS, ROUTINE W REFLEX MICROSCOPIC - Abnormal; Notable for the following components:   Glucose, UA 150 (*)    All other components within normal limits  PROTIME-INR - Abnormal; Notable for the following components:   Prothrombin Time 61.4 (*)    INR 7.2 (*)    All other components within normal limits  RESP PANEL BY RT-PCR (FLU A&B, COVID) ARPGX2  CBG MONITORING, ED  POC OCCULT BLOOD, ED  TYPE AND SCREEN  PREPARE RBC (CROSSMATCH)    EKG EKG Interpretation  Date/Time:  Monday October 01 2021 18:32:25 EST Ventricular Rate:  90 PR Interval:    QRS Duration: 117 QT Interval:  385 QTC  Calculation: 472 R Axis:   -53 Text Interpretation: Atrial fibrillation Left anterior fascicular block Low voltage, precordial leads Consider anterior infarct Abnormal T, consider ischemia, lateral leads Confirmed by Fredia Sorrow [phone]) on 10/01/2021 9:22:23 PM  Radiology CT Head Wo Contrast  Result Date: 10/01/2021 CLINICAL DATA:  Initial evaluation for acute headache, fall. EXAM: CT HEAD WITHOUT CONTRAST TECHNIQUE: Contiguous axial images were obtained from the base of the skull through the vertex without intravenous contrast. COMPARISON:  None available. FINDINGS: Brain: Diffuse prominence of the CSF containing spaces compatible generalized age-related cerebral atrophy. Mild chronic microvascular ischemic changes noted involving the supraorbital cerebral white matter. No acute intracranial hemorrhage. No acute large vessel territory infarct. No mass lesion. 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 Associated mild right-to-left shift of up to 4 mm. No hydrocephalus or trapping. Vascular: No hyperdense vessel. Scattered vascular calcifications noted within the carotid siphons. Skull: Scalp soft tissues and calvarium demonstrate no acute finding. Scattered vascular calcifications noted about the scalp. Sinuses/Orbits: Globes and orbital soft tissues demonstrate no acute finding. Paranasal sinuses are largely clear. No significant mastoid effusion. Other: None. IMPRESSION: 1. No acute intracranial abnormality. 2. 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 Associated mild right-to-left shift of up to 4 mm. No hydrocephalus or trapping. 3. Age-related cerebral atrophy with mild chronic small vessel ischemic disease. Electronically Signed   By: Jeannine Boga M.D.   On: 10/01/2021 21:17    Procedures Procedures   Medications Ordered in  ED Medications  0.9 %  sodium chloride infusion (has no administration in time range)  0.9 %  sodium chloride infusion (has no administration in time range)  sodium chloride 0.9 % bolus 500 mL (has no administration in time range)  sodium chloride 0.9 % bolus 250 mL (250 mLs Intravenous New Bag/Given 10/01/21 2020)    ED Course  I have reviewed the triage vital signs and the nursing notes.  Pertinent labs & imaging results that were available during my care of the patient were reviewed by me and considered in my medical decision making (see chart for details).    MDM Rules/Calculators/A&P                         CRITICAL CARE Performed by: Fredia Sorrow Total critical care time: 60 minutes Critical care time was exclusive of separately billable procedures and treating other patients. Critical care was necessary to treat or prevent imminent or life-threatening deterioration. Critical care was time spent personally by me on the following activities: development of treatment plan with patient and/or surrogate as well as nursing, discussions with consultants, evaluation of patient's response to treatment, examination of patient, obtaining history from patient or surrogate, ordering and performing treatments and interventions, ordering and review of laboratory studies, ordering and review of radiographic studies, pulse oximetry and re-evaluation of patient's condition.  Patient sent in by primary care provider.  Patient's INR in the 7 range.  Heme positive from below.  Patient on Coumadin for-year-old fibrillation as well as a history of aortic stenosis status post aortic valve replacement with bioprosthetic valve.  Sort of black in color so GI bleed with melena.  Patient blood pressures did drop after 500 cc bolus.  Will get a second 500 cc bolus which will take him up to a liter and he got 250 prior to that so that will take him to a liter and 250.  Blood was ordered for transfusion of 2 units  but not ready yet.  Patient's current blood pressure is 100.  COVID testing negative.  Hemoglobin was 6.4.  Platelets are normal at 217.  As mentioned INR was 7.2.  Urinalysis negative.  Chest x-ray without any acute process.  Abdomen soft and nontender.  Most likely is having the melena due to the coagulopathy.  Will discuss with [facility] seen by Dr. [name] in the past.  He is on-call tonight.  And will discuss reversal with vitamin K or whether we need to go with prothrombin complex concentrate.  Patient will need hospitalist admission.  Perhaps the 1 ago with critical care admission because of the hypotension he experienced.  Gust with admitting hospitalist.  Were going to going give the IV vitamin K.  And not at this time give 4 factor prothrombin complex concentrate.  Dr. [name] from gastroenterology called.  Nothing to be done tonight with that INR.  She will see him in consultation tomorrow.  Final Clinical Impression(s) / ED Diagnoses Final diagnoses:  Anemia, unspecified type  Coagulopathy [facility])    Rx / DC Orders ED Discharge Orders     None        Fredia Sorrow, MD 10/01/21 2242  Fredia Sorrow, MD 10/01/21 3241    Fredia Sorrow, MD 10/01/21 2300

## 2021-10-01 NOTE — ED Provider Notes (Signed)
Emergency Medicine Provider Triage Evaluation Note  [name] , a 87 y.o. male  was evaluated in triage.  Pt presents from PCP office for anemia.  Patient also reports having an unwitnessed fall last week where he had head injury and currently has bruise on right forehead.  Reports he is also supratherapeutic with INR of 8.  He reports last week it was somewhere in the 3 range and they decrease his warfarin dose.  Patient has history of A. fib, and congestive heart failure.  Reports compliance with his Lasix.   Review of Systems  Positive: Fall, head injury Negative: Chest pain, shortness of breath  Physical Exam  BP (!) 98/39 (BP Location: Right Arm)   Pulse (!) 53   Temp 98.4 F (36.9 C) (Oral)   Resp 18   Ht 5' 8.5" (1.74 m)   Wt 73 kg   SpO2 100%   BMI 24.12 kg/m  Gen:   Awake, no distress   Resp:  Normal effort  MSK:   Moves extremities without difficulty  Other:  Cervical spine without tenderness to palpation  Medical Decision Making  Medically screening exam initiated at 6:53 PM.  Appropriate orders placed.  [name] was informed that the remainder of the evaluation will be completed by another provider, this initial triage assessment does not replace that evaluation, and the importance of remaining in the ED until their evaluation is complete.  Unsure of what his hemoglobin level was at clinic.  Will order CBC and evaluate INR.  Will order CT head given fall last week.  Micro bolus of 250 mL ordered given hypotension.  We will hold off on large volume resuscitation given history of CHF and some pitting edema present.   [name], PA-C 10/01/21 1856    [name], MD 10/02/21 0001

## 2021-10-01 NOTE — ED Triage Notes (Addendum)
Patient went to his PCP today for a regular appointment. This afternoon he was called and told to come to the ED due to a low Hgb. Patient denies any obvious bleeding. Patient also c/o weakness.

## 2021-10-01 NOTE — ED Notes (Signed)
Consult to Gastroenterology@22 :50pm.

## 2021-10-02 ENCOUNTER — Observation Stay (HOSPITAL_COMMUNITY): Payer: Medicare Other

## 2021-10-02 ENCOUNTER — Encounter (HOSPITAL_COMMUNITY): Payer: Self-pay | Admitting: Internal Medicine

## 2021-10-02 DIAGNOSIS — I5032 Chronic diastolic (congestive) heart failure: Secondary | ICD-10-CM | POA: Diagnosis not present

## 2021-10-02 DIAGNOSIS — I10 Essential (primary) hypertension: Secondary | ICD-10-CM

## 2021-10-02 DIAGNOSIS — Z8546 Personal history of malignant neoplasm of prostate: Secondary | ICD-10-CM | POA: Diagnosis not present

## 2021-10-02 DIAGNOSIS — L89153 Pressure ulcer of sacral region, stage 3: Secondary | ICD-10-CM | POA: Diagnosis not present

## 2021-10-02 DIAGNOSIS — R42 Dizziness and giddiness: Secondary | ICD-10-CM | POA: Diagnosis present

## 2021-10-02 DIAGNOSIS — Z20822 Contact with and (suspected) exposure to covid-19: Secondary | ICD-10-CM | POA: Diagnosis not present

## 2021-10-02 DIAGNOSIS — Z953 Presence of xenogenic heart valve: Secondary | ICD-10-CM

## 2021-10-02 DIAGNOSIS — Z87891 Personal history of nicotine dependence: Secondary | ICD-10-CM | POA: Diagnosis not present

## 2021-10-02 DIAGNOSIS — K922 Gastrointestinal hemorrhage, unspecified: Secondary | ICD-10-CM

## 2021-10-02 DIAGNOSIS — Z79899 Other long term (current) drug therapy: Secondary | ICD-10-CM | POA: Diagnosis not present

## 2021-10-02 DIAGNOSIS — D649 Anemia, unspecified: Secondary | ICD-10-CM

## 2021-10-02 DIAGNOSIS — E11319 Type 2 diabetes mellitus with unspecified diabetic retinopathy without macular edema: Secondary | ICD-10-CM | POA: Diagnosis not present

## 2021-10-02 DIAGNOSIS — E1151 Type 2 diabetes mellitus with diabetic peripheral angiopathy without gangrene: Secondary | ICD-10-CM | POA: Diagnosis not present

## 2021-10-02 DIAGNOSIS — E785 Hyperlipidemia, unspecified: Secondary | ICD-10-CM | POA: Diagnosis present

## 2021-10-02 DIAGNOSIS — E1140 Type 2 diabetes mellitus with diabetic neuropathy, unspecified: Secondary | ICD-10-CM | POA: Diagnosis not present

## 2021-10-02 DIAGNOSIS — Z923 Personal history of irradiation: Secondary | ICD-10-CM | POA: Diagnosis not present

## 2021-10-02 DIAGNOSIS — Z7984 Long term (current) use of oral hypoglycemic drugs: Secondary | ICD-10-CM | POA: Diagnosis not present

## 2021-10-02 DIAGNOSIS — I110 Hypertensive heart disease with heart failure: Secondary | ICD-10-CM | POA: Diagnosis not present

## 2021-10-02 DIAGNOSIS — K2211 Ulcer of esophagus with bleeding: Secondary | ICD-10-CM | POA: Diagnosis not present

## 2021-10-02 DIAGNOSIS — R140 Abdominal distension (gaseous): Secondary | ICD-10-CM | POA: Diagnosis present

## 2021-10-02 DIAGNOSIS — E119 Type 2 diabetes mellitus without complications: Secondary | ICD-10-CM

## 2021-10-02 DIAGNOSIS — D689 Coagulation defect, unspecified: Secondary | ICD-10-CM | POA: Diagnosis present

## 2021-10-02 DIAGNOSIS — I482 Chronic atrial fibrillation, unspecified: Secondary | ICD-10-CM | POA: Diagnosis not present

## 2021-10-02 DIAGNOSIS — R531 Weakness: Secondary | ICD-10-CM | POA: Diagnosis not present

## 2021-10-02 DIAGNOSIS — D6832 Hemorrhagic disorder due to extrinsic circulating anticoagulants: Secondary | ICD-10-CM | POA: Diagnosis not present

## 2021-10-02 DIAGNOSIS — Z880 Allergy status to penicillin: Secondary | ICD-10-CM | POA: Diagnosis not present

## 2021-10-02 DIAGNOSIS — I4811 Longstanding persistent atrial fibrillation: Secondary | ICD-10-CM

## 2021-10-02 DIAGNOSIS — Z8249 Family history of ischemic heart disease and other diseases of the circulatory system: Secondary | ICD-10-CM | POA: Diagnosis not present

## 2021-10-02 DIAGNOSIS — D62 Acute posthemorrhagic anemia: Secondary | ICD-10-CM | POA: Diagnosis not present

## 2021-10-02 DIAGNOSIS — I959 Hypotension, unspecified: Secondary | ICD-10-CM | POA: Diagnosis present

## 2021-10-02 DIAGNOSIS — Z833 Family history of diabetes mellitus: Secondary | ICD-10-CM | POA: Diagnosis not present

## 2021-10-02 LAB — CBC WITH DIFFERENTIAL/PLATELET
Abs Immature Granulocytes: 0.04 10*3/uL (ref 0.00–0.07)
Basophils Absolute: 0 10*3/uL (ref 0.0–0.1)
Basophils Relative: 0 %
Eosinophils Absolute: 0 10*3/uL (ref 0.0–0.5)
Eosinophils Relative: 1 %
HCT: 23.2 % — ABNORMAL LOW (ref 39.0–52.0)
Hemoglobin: 7.6 g/dL — ABNORMAL LOW (ref 13.0–17.0)
Immature Granulocytes: 1 %
Lymphocytes Relative: 12 %
Lymphs Abs: 0.9 10*3/uL (ref 0.7–4.0)
MCH: 31.9 pg (ref 26.0–34.0)
MCHC: 32.8 g/dL (ref 30.0–36.0)
MCV: 97.5 fL (ref 80.0–100.0)
Monocytes Absolute: 0.7 10*3/uL (ref 0.1–1.0)
Monocytes Relative: 10 %
Neutro Abs: 5.6 10*3/uL (ref 1.7–7.7)
Neutrophils Relative %: 76 %
Platelets: 214 10*3/uL (ref 150–400)
RBC: 2.38 MIL/uL — ABNORMAL LOW (ref 4.22–5.81)
RDW: 15.8 % — ABNORMAL HIGH (ref 11.5–15.5)
WBC: 7.3 10*3/uL (ref 4.0–10.5)
nRBC: 0.3 % — ABNORMAL HIGH (ref 0.0–0.2)

## 2021-10-02 LAB — COMPREHENSIVE METABOLIC PANEL
ALT: 17 U/L (ref 0–44)
AST: 23 U/L (ref 15–41)
Albumin: 2.9 g/dL — ABNORMAL LOW (ref 3.5–5.0)
Alkaline Phosphatase: 44 U/L (ref 38–126)
Anion gap: 8 (ref 5–15)
BUN: 100 mg/dL — ABNORMAL HIGH (ref 8–23)
CO2: 21 mmol/L — ABNORMAL LOW (ref 22–32)
Calcium: 8.6 mg/dL — ABNORMAL LOW (ref 8.9–10.3)
Chloride: 108 mmol/L (ref 98–111)
Creatinine, Ser: 1.11 mg/dL (ref 0.61–1.24)
GFR, Estimated: >60 mL/min (ref >60)
Glucose, Bld: 118 mg/dL — ABNORMAL HIGH (ref 70–99)
Potassium: 4.7 mmol/L (ref 3.5–5.1)
Sodium: 137 mmol/L (ref 135–145)
Total Bilirubin: 1.2 mg/dL (ref 0.3–1.2)
Total Protein: 5.5 g/dL — ABNORMAL LOW (ref 6.5–8.1)

## 2021-10-02 LAB — PROTIME-INR
INR: 2.4 — ABNORMAL HIGH (ref 0.8–1.2)
Prothrombin Time: 25.7 seconds — ABNORMAL HIGH (ref 11.4–15.2)

## 2021-10-02 LAB — CBG MONITORING, ED
Glucose-Capillary: 107 mg/dL — ABNORMAL HIGH (ref 70–99)
Glucose-Capillary: 112 mg/dL — ABNORMAL HIGH (ref 70–99)
Glucose-Capillary: 88 mg/dL (ref 70–99)

## 2021-10-02 LAB — GLUCOSE, CAPILLARY: Glucose-Capillary: 95 mg/dL (ref 70–99)

## 2021-10-02 MED ORDER — PANTOPRAZOLE SODIUM 40 MG IV SOLR
INTRAVENOUS | Status: AC
  Filled 2021-10-02: qty 40

## 2021-10-02 MED ORDER — SODIUM CHLORIDE 0.9 % IV SOLN: INTRAVENOUS | Status: DC

## 2021-10-02 MED ORDER — METOPROLOL TARTRATE 5 MG/5ML IV SOLN
5.0000 mg | Freq: Four times a day (QID) | INTRAVENOUS | Status: DC | PRN
  Administered 2021-10-03: 5 mg via INTRAVENOUS
  Filled 2021-10-02: qty 5

## 2021-10-02 MED ORDER — LABETALOL HCL 5 MG/ML IV SOLN: 10.0000 mg | INTRAVENOUS | Status: DC | PRN

## 2021-10-02 MED ORDER — INSULIN ASPART 100 UNIT/ML IJ SOLN
0.0000 [IU] | INTRAMUSCULAR | Status: DC
Start: 2021-10-02 — End: 2021-10-03
  Administered 2021-10-03: 2 [IU] via SUBCUTANEOUS
  Filled 2021-10-02: qty 0.09

## 2021-10-02 MED ORDER — PANTOPRAZOLE SODIUM 40 MG IV SOLR
40.0000 mg | Freq: Two times a day (BID) | INTRAVENOUS | Status: DC
  Administered 2021-10-02 – 2021-10-06 (×7): 40 mg via INTRAVENOUS
  Filled 2021-10-02 (×7): qty 40

## 2021-10-02 MED ORDER — ACETAMINOPHEN 325 MG PO TABS
ORAL_TABLET | ORAL | Status: AC
  Filled 2021-10-02: qty 2

## 2021-10-02 MED ORDER — ACETAMINOPHEN 325 MG PO TABS
650.0000 mg | ORAL_TABLET | Freq: Four times a day (QID) | ORAL | Status: DC | PRN
  Administered 2021-10-02 (×2): 650 mg via ORAL

## 2021-10-02 MED ORDER — POLYETHYLENE GLYCOL 3350 17 G PO PACK: 17.0000 g | PACK | Freq: Two times a day (BID) | ORAL | Status: DC | PRN

## 2021-10-02 MED ORDER — ACETAMINOPHEN 650 MG RE SUPP: 650.0000 mg | Freq: Four times a day (QID) | RECTAL | Status: DC | PRN

## 2021-10-02 NOTE — Consult Note (Signed)
Referring Provider: ED Primary Care Physician:  [name], MD Primary Gastroenterologist:  [facility]  Reason for Consultation:  Anemia  HPI: [name] is a 87 y.o. male with history of atrial fibrillation, status post TAVR, diastolic CHF, hypertension, diabetes mellitus, peripheral vascular disease, prostate cancer presents for anemia.  Seen by Dr. Orland Mustard, PCP with [facility], for regular blood work. History of anemia ongoing for 1 year. Takes iron supplementation. CBC showed hgb 6.7 and patient was sent to ED. Iron was 89, no ferritin collected. Back in March 2022, Hgb was 10.1. FOBT positive in ED.  On coumadin for afib. States his last dose was Sunday night (11/27). States yesterday he woke up and felt very tired. Reports dark stools since iron supplementation that started one year ago. Will sometimes have normal colored stools. Struggles with constipation since taking iron. Does not take medication for it as he is concerned medication will result in diarrhea and he often visits his wife in a nursing home and doesn't want to have to worry about that. Last BM was about 4 days ago. Will sometimes have lower abdominal pain associated with constipation that gets better after a BM. Denies hematochezia. Denies family history of colon cancer.  Last colonoscopy in 2013 with Dr. 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  EGD 07/2020 done for dysphagia with Dr. [name]: mucoscal nodule found in esophagus, tortuous esophagus, dilation at GE junction, normal stomach and duodenum. No specimens collected.  Past Medical History:  Diagnosis Date   Anemia    LOW PLATELETS OTHER DAY  PER PT   Anticoagulated on Coumadin 12/21/2012   Aortic stenosis    Arthritis    "left wrist; back sometimes" (01/21/2013)   Atrial fibrillation ([place])    [name], DR COOPER   Bradycardia    Exertional shortness of breath    Heart murmur    "I've had it for years;  runs in the family on daddy's side" (01/21/2013)   HTN (hypertension)    Hyperlipemia 12/21/2012   Prostate cancer ([place])    "had 40 tx of radiation in 2009" (01/21/2013)   S/P aortic valve replacement with bioprosthetic valve 01/19/2013   Transcatheter Aortic Valve Replacement using 68mm Sapien bioprosthetic tissue valve via transapical approach   Type II diabetes mellitus ([place])     Past Surgical History:  Procedure Laterality Date   BALLOON DILATION N/A 08/02/2020   Procedure: BALLOON DILATION;  Surgeon: [name], MD;  Location: [facility];  Service: Gastroenterology;  Laterality: N/A;   CARDIAC CATHETERIZATION  12/16/2012   CARDIAC VALVE REPLACEMENT  01/19/2013   AVR   CATARACT EXTRACTION W/ INTRAOCULAR LENS  IMPLANT, BILATERAL     ESOPHAGOGASTRODUODENOSCOPY (EGD) WITH PROPOFOL N/A 08/02/2020   Procedure: ESOPHAGOGASTRODUODENOSCOPY (EGD) WITH PROPOFOL;  Surgeon: [name], MD;  Location: [facility];  Service: Gastroenterology;  Laterality: N/A;   EYE SURGERY     INTRAOPERATIVE TRANSESOPHAGEAL ECHOCARDIOGRAM N/A 01/19/2013   Procedure: INTRAOPERATIVE TRANSESOPHAGEAL ECHOCARDIOGRAM;  Surgeon: [name], MD;  Location: [facility];  Service: Open Heart Surgery;  Laterality: N/A;   ORIF FOREARM FRACTURE Left 1954   "compound fx" (01/21/2013)   TRANSTHORACIC ECHOCARDIOGRAM  09/04/10, 09/07/08    Prior to Admission medications   Medication Sig Start Date End Date Taking? Authorizing Provider  Acetaminophen (TYLENOL ARTHRITIS PAIN PO) Take 650 mg by mouth daily with breakfast.   Yes [provider]  B Complex Vitamins (B COMPLEX 50) TABS Take 1 tablet  by mouth daily with breakfast.   Yes [provider]  carvedilol (COREG) 12.5 MG tablet Take 1 tablet by mouth twice daily Patient taking differently: Take 12.5 mg by mouth 2 (two) times daily with breakfast and lunch. 07/12/21  Yes [name], MD  cholecalciferol (VITAMIN D3) 25 MCG (1000 UNIT) tablet Take 1,000 Units  by mouth 2 (two) times daily.   Yes [provider]  dapagliflozin propanediol (FARXIGA) 10 MG TABS tablet Take 1 tablet (10 mg total) by mouth daily before breakfast. 12/08/20  Yes Chandrasekhar, Mahesh A, MD  doxazosin (CARDURA) 8 MG tablet Take 8 mg by mouth every evening.   Yes [provider]  enzalutamide (XTANDI) 40 MG capsule Take 160 mg by mouth daily at 2 PM.   Yes [provider]  ferrous sulfate 325 (65 FE) MG tablet Take 325 mg by mouth See admin instructions. Take one tablet (325 mg) by mouth twice daily - with lunch and supper   Yes [provider]  furosemide (LASIX) 40 MG tablet Take 1 tablet (40 mg total) by mouth daily. Patient taking differently: Take 40 mg by mouth daily with lunch. 10/31/20 11/02/21 Yes Chandrasekhar, Mahesh A, MD  gabapentin (NEURONTIN) 100 MG capsule Take 1 capsule (100 mg total) by mouth at bedtime. 09/21/21  Yes Patel, Donika K, DO  glipiZIDE (GLUCOTROL) 5 MG tablet Take 5 mg by mouth 2 (two) times daily with breakfast and lunch.   Yes [provider]  lisinopril (ZESTRIL) 20 MG tablet Take 1 tablet by mouth once daily Patient taking differently: Take 20 mg by mouth daily with breakfast. 08/10/21  Yes [name], MD  metFORMIN (GLUCOPHAGE) 1000 MG tablet Take 1 tablet by mouth 2 (two) times daily with breakfast and lunch.   Yes [provider]  pantoprazole (PROTONIX) 40 MG tablet Take 40 mg by mouth daily.   Yes [provider]  rosuvastatin (CRESTOR) 5 MG tablet TAKE 1 TABLET BY MOUTH ONCE DAILY AT  6PM Patient taking differently: Take 5 mg by mouth every evening. 07/24/21  Yes Chandrasekhar, Mahesh A, MD  Selenium 200 MCG CAPS Take 200 mcg by mouth daily with breakfast.   Yes [provider]  Vitamin A 2400 MCG (8000 UT) TABS Take 2,400 mcg by mouth daily with breakfast.   Yes [provider]  vitamin B-12 (CYANOCOBALAMIN) 1000 MCG tablet Take 1,000 mcg by mouth 2 (two)  times daily.   Yes [provider]  vitamin C (ASCORBIC ACID) 500 MG tablet Take 500 mg by mouth 2 (two) times daily.   Yes [provider]  warfarin (COUMADIN) 5 MG tablet Take 5 mg by mouth See admin instructions. Take one tablet (5 mg) by mouth on Sunday, Tuesday, Wednesday, Friday, Saturday at 7pm (take 7.5 mg tablet on Monday and Thursday)   Yes [provider]  warfarin (COUMADIN) 7.5 MG tablet Take 7.5 mg by mouth See admin instructions. Take one tablet (7.5 mg) by mouth on Monday and Thursday at 7pm (take 5 mg on all other days of the week)   Yes [provider]  zinc gluconate 50 MG tablet Take 50 mg by mouth daily with breakfast.   Yes [provider]  ACCU-CHEK AVIVA PLUS test strip USE TO CHECK BLOOD SUGARS 1 TO 2 TIMES A DAY 05/23/18   [provider]  ACCU-CHEK SOFTCLIX LANCETS lancets daily. use as directed 04/07/18   [provider]  clindamycin (CLEOCIN) 300 MG capsule Take  2 capsules (600 mg total) by mouth as needed. Take 1 hour prior to dental work Patient not taking: Reported on 10/01/2021 12/01/20   [name], MD  verapamil (CALAN-SR) 180 MG CR tablet Take 1 tablet (180 mg total) by mouth at bedtime. Patient not taking: Reported on 10/01/2021 07/27/21   [name], MD    Scheduled Meds:  insulin aspart  0-9 Units Subcutaneous Q4H   pantoprazole (PROTONIX) IV  40 mg Intravenous Q12H   Continuous Infusions:  sodium chloride Stopped (10/01/21 2134)   sodium chloride 75 mL/hr at 10/02/21 0618   PRN Meds:.acetaminophen **OR** acetaminophen, labetalol  Allergies as of 10/01/2021 - Review Complete 10/01/2021  Allergen Reaction Noted   Amoxicillin Other (See Comments) 05/25/2020    Family History  Problem Relation Age of Onset   Diabetes Mother    Melanoma Mother 47   Bone cancer Mother 77   Pancreatic cancer Father 73   Heart disease Father    Stroke Sister    Cancer Brother 32       ?  brain cancer    Social History   Socioeconomic History   Marital status: Married    Spouse name: Not on file   Number of children: Not on file   Years of education: Not on file   Highest education level: Not on file  Occupational History   Not on file  Tobacco Use   Smoking status: Former    Packs/day: 2.00    Years: 22.00    Pack years: 44.00    Types: Cigarettes    Quit date: 09/11/1976    Years since quitting: 45.0   Smokeless tobacco: Never  Vaping Use   Vaping Use: Never used  Substance and Sexual Activity   Alcohol use: No   Drug use: No   Sexual activity: Never  Other Topics Concern   Not on file  Social History Narrative   Not on file   Social Determinants of Health   Financial Resource Strain: Not on file  Food Insecurity: Not on file  Transportation Needs: Not on file  Physical Activity: Not on file  Stress: Not on file  Social Connections: Not on file  Intimate Partner Violence: Not on file    Review of Systems: Review of Systems  Constitutional:  Negative for chills and fever.  HENT:  Negative for ear pain and hearing loss.   Eyes:  Negative for pain and redness.  Respiratory:  Negative for cough and hemoptysis.   Cardiovascular:  Negative for chest pain and palpitations.  Gastrointestinal:  Positive for abdominal pain (intermittent, lower abdomen), constipation and melena (since iron supplementation). Negative for blood in stool, diarrhea, heartburn, nausea and vomiting.  Genitourinary:  Negative for dysuria and urgency.  Musculoskeletal:  Negative for neck pain.  Skin:  Negative for itching and rash.  Neurological:  Negative for dizziness and headaches.  Psychiatric/Behavioral:  Negative for substance abuse.     Physical Exam:Physical Exam Constitutional:      General: He is not in acute distress.    Appearance: Normal appearance. He is not ill-appearing.     Comments: Daughter at bedside  HENT:     Head: Normocephalic and atraumatic.      Nose: Nose normal. No congestion.     Mouth/Throat:     Mouth: Mucous membranes are moist.     Pharynx: Oropharynx is clear.  Eyes:     General: No scleral icterus.    Extraocular Movements: Extraocular movements  intact.     Comments: Conjunctival pallor  Cardiovascular:     Rate and Rhythm: Normal rate. Rhythm irregular.  Pulmonary:     Effort: Pulmonary effort is normal. No respiratory distress.  Abdominal:     General: Abdomen is flat. There is no distension.     Palpations: Abdomen is soft. There is no mass.     Tenderness: There is no abdominal tenderness. There is no guarding or rebound.     Hernia: No hernia is present.     Comments: Sluggish bowel sounds  Musculoskeletal:        General: No swelling. Normal range of motion.     Cervical back: Normal range of motion. No rigidity.     Comments: bandages on shins  Neurological:     General: No focal deficit present.     Mental Status: He is alert and oriented to person, place, and time.  Psychiatric:        Mood and Affect: Mood normal.        Behavior: Behavior normal.        Thought Content: Thought content normal.        Judgment: Judgment normal.    Vital signs: Vitals:   10/02/21 0731 10/02/21 0737  BP: 99/63 99/63  Pulse: 100 100  Resp: 18 18  Temp: 98 F (36.7 C) 98 F (36.7 C)  SpO2: 98% 98%        GI:  Lab Results: Recent Labs    10/01/21 2019 10/02/21 0613  WBC 6.6 7.3  HGB 6.4* 7.6*  HCT 19.9* 23.2*  PLT 217 214   BMET Recent Labs    10/01/21 2019 10/02/21 0613  NA 135 137  K 4.6 4.7  CL 105 108  CO2 21* 21*  GLUCOSE 108* 118*  BUN 105* 100*  CREATININE 1.26* 1.11  CALCIUM 8.9 8.6*   LFT Recent Labs    10/02/21 0613  PROT 5.5*  ALBUMIN 2.9*  AST 23  ALT 17  ALKPHOS 44  BILITOT 1.2   PT/INR Recent Labs    10/01/21 2019 10/02/21 0613  LABPROT 61.4* 25.7*  INR 7.2* 2.4*     Studies/Results: DG Pelvis 1-2 Views  Result Date: 10/02/2021 CLINICAL DATA:  Weakness  fiducial markers EXAM: PELVIS - 1-2 VIEW COMPARISON:  None. FINDINGS: There is no evidence of pelvic fracture or diastasis. No pelvic bone lesions are seen. Noted in the prostate gland. IMPRESSION: Negative. Electronically Signed   By: Misty Stanley M.D.   On: 10/02/2021 07:26   CT Head Wo Contrast  Result Date: 10/01/2021 CLINICAL DATA:  Initial evaluation for acute headache, fall. EXAM: CT HEAD WITHOUT CONTRAST TECHNIQUE: Contiguous axial images were obtained from the base of the skull through the vertex without intravenous contrast. COMPARISON:  None available. FINDINGS: Brain: Diffuse prominence of the CSF containing spaces compatible generalized age-related cerebral atrophy. Mild chronic microvascular ischemic changes noted involving the supraorbital cerebral white matter. No acute intracranial hemorrhage. No acute large vessel territory infarct. No mass lesion. 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 Associated mild right-to-left shift of up to 4 mm. No hydrocephalus or trapping. Vascular: No hyperdense vessel. Scattered vascular calcifications noted within the carotid siphons. Skull: Scalp soft tissues and calvarium demonstrate no acute finding. Scattered vascular calcifications noted about the scalp. Sinuses/Orbits: Globes and orbital soft tissues demonstrate no acute finding. Paranasal sinuses are largely clear. No significant mastoid effusion. Other: None. IMPRESSION:  1. No acute intracranial abnormality. 2. 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 Associated mild right-to-left shift of up to 4 mm. No hydrocephalus or trapping. 3. Age-related cerebral atrophy with mild chronic small vessel ischemic disease. Electronically Signed   By: Jeannine Boga M.D.   On: 10/01/2021 21:17   DG Chest Port 1 View  Result Date: 10/01/2021 CLINICAL DATA:   Anemia. EXAM: PORTABLE CHEST 1 VIEW COMPARISON:  Chest x-ray 02/26/2013. FINDINGS: Cardiomediastinal silhouette is within normal limits for projection. The heart is enlarged, unchanged. Patient is status post TAVR. The lungs and costophrenic angles are clear. There is no pneumothorax. Degenerative changes affect both shoulders. IMPRESSION: 1. No acute cardiopulmonary process. 2. Cardiomegaly. Electronically Signed   By: Ronney Asters M.D.   On: 10/01/2021 21:48    Impression: Anemia, positive FOBT - 7.6 (s/p 2 unit pRBCs), MCV 97.5 (hgb 10.1 01/2021) - Iron 89, no ferritin obtained - BUN 100 (46 6/22), Cr 1.11 - INR 2.4 - Last EGD 07/2020:done for dysphagia with Dr. [name]: mucoscal nodule found in esophagus, tortuous esophagus, dilation at GE junction, normal stomach and duodenum. No specimens collected.  Constipation  Afib - last dose of coumadin was 11/27  Essential hypertension   Plan: Continue Protonix 40mg  BID  Continue supportive care as needed.  Can possibly consider EGD tomorrow. I thoroughly discussed the procedures to include nature, alternatives, benefits, and risks including but not limited to bleeding, perforation, infection, anesthesia/cardiac and pulmonary complications. Patient provides understanding and gave verbal consent to proceed.  Continue daily CBC and transfuse as needed to maintain HGB > 7   Can use miralax prn for constipation.   [facility] will follow.      LOS: 0 days   Loriana Samad Radford Pax  PA-C 10/02/2021, 7:53 AM  Contact #  [phone]

## 2021-10-02 NOTE — H&P (Signed)
History and Physical    [name] MEQ:[id number] DOB: [birth year] DOA: 10/01/2021  PCP: [name], MD  Patient coming from: Home.  Chief Complaint: Low hemoglobin.  HPI: [name] is a 87 y.o. male with history of atrial fibrillation, status post TAVR, diastolic CHF, hypertension, diabetes mellitus, peripheral vascular disease, prostate cancer routine follow-up with his primary care physician and had blood work done which showed significant fall in his hemoglobin.  Patient was advised to come to the ER.  Patient did have a fall at home about a week ago did sustain a small bruise on his shoulder also skin tears both lower extremities around the shin.  Patient denies noticing any blood in the stools or melena.  Denies any abdominal pain.  Has been feeling weak.  ED Course: In the ER patient was stool for occult blood positive hemoglobin was around 6.4 which is a drop from 11.9 in May of this year.  Patient's wound was melanotic as per the ER physician.  Patient was initially hypotensive improved with fluid bolus and 2 units of PRBC transfusion has been ordered and patient's INR is around 7 so vitamin K has been ordered.  On-call gastroenterologist Dr. [name] who has had done endoscopy for the patient in September 2021 for esophageal dilatation.  Will be seeing patient in consult but requested to first correct the INR.  COVID test is negative.  Review of Systems: As per HPI, rest all negative.   Past Medical History:  Diagnosis Date   Anemia    LOW PLATELETS OTHER DAY  PER PT   Anticoagulated on Coumadin 12/21/2012   Aortic stenosis    Arthritis    "left wrist; back sometimes" (01/21/2013)   Atrial fibrillation ([place])    [name], DR COOPER   Bradycardia    Exertional shortness of breath    Heart murmur    "I've had it for years; runs in the family on daddy's side" (01/21/2013)   HTN (hypertension)    Hyperlipemia 12/21/2012   Prostate cancer ([place])    "had 40 tx of  radiation in 2009" (01/21/2013)   S/P aortic valve replacement with bioprosthetic valve 01/19/2013   Transcatheter Aortic Valve Replacement using 79mm Sapien bioprosthetic tissue valve via transapical approach   Type II diabetes mellitus ([place])     Past Surgical History:  Procedure Laterality Date   BALLOON DILATION N/A 08/02/2020   Procedure: BALLOON DILATION;  Surgeon: [name], MD;  Location: [facility];  Service: Gastroenterology;  Laterality: N/A;   CARDIAC CATHETERIZATION  12/16/2012   CARDIAC VALVE REPLACEMENT  01/19/2013   AVR   CATARACT EXTRACTION W/ INTRAOCULAR LENS  IMPLANT, BILATERAL     ESOPHAGOGASTRODUODENOSCOPY (EGD) WITH PROPOFOL N/A 08/02/2020   Procedure: ESOPHAGOGASTRODUODENOSCOPY (EGD) WITH PROPOFOL;  Surgeon: [name], MD;  Location: [facility];  Service: Gastroenterology;  Laterality: N/A;   EYE SURGERY     INTRAOPERATIVE TRANSESOPHAGEAL ECHOCARDIOGRAM N/A 01/19/2013   Procedure: INTRAOPERATIVE TRANSESOPHAGEAL ECHOCARDIOGRAM;  Surgeon: [name], MD;  Location: [facility];  Service: Open Heart Surgery;  Laterality: N/A;   ORIF FOREARM FRACTURE Left 1952   "compound fx" (01/21/2013)   TRANSTHORACIC ECHOCARDIOGRAM  09/04/10, 09/07/08     reports that he quit smoking about 45 years ago. His smoking use included cigarettes. He has a 44.00 pack-year smoking history. He has never used smokeless tobacco. He reports that he does not drink alcohol and does not use drugs.  Allergies  Allergen Reactions   Amoxicillin Other (See  Comments)    Burning of mouth, tongue and lips.  itchy, burning throat    Family History  Problem Relation Age of Onset   Diabetes Mother    Melanoma Mother 47   Bone cancer Mother 57   Pancreatic cancer Father 46   Heart disease Father    Stroke Sister    Cancer Brother 84       ? brain cancer    Prior to Admission medications   Medication Sig Start Date End Date Taking? Authorizing Provider  Acetaminophen (TYLENOL ARTHRITIS PAIN  PO) Take 650 mg by mouth daily with breakfast.   Yes [provider]  B Complex Vitamins (B COMPLEX 50) TABS Take 1 tablet by mouth daily with breakfast.   Yes [provider]  carvedilol (COREG) 12.5 MG tablet Take 1 tablet by mouth twice daily Patient taking differently: Take 12.5 mg by mouth 2 (two) times daily with breakfast and lunch. 07/12/21  Yes [name], MD  cholecalciferol (VITAMIN D3) 25 MCG (1000 UNIT) tablet Take 1,000 Units by mouth 2 (two) times daily.   Yes [provider]  dapagliflozin propanediol (FARXIGA) 10 MG TABS tablet Take 1 tablet (10 mg total) by mouth daily before breakfast. 12/08/20  Yes Chandrasekhar, Mahesh A, MD  doxazosin (CARDURA) 8 MG tablet Take 8 mg by mouth every evening.   Yes [provider]  enzalutamide (XTANDI) 40 MG capsule Take 160 mg by mouth daily at 2 PM.   Yes [provider]  ferrous sulfate 325 (65 FE) MG tablet Take 325 mg by mouth See admin instructions. Take one tablet (325 mg) by mouth twice daily - with lunch and supper   Yes [provider]  furosemide (LASIX) 40 MG tablet Take 1 tablet (40 mg total) by mouth daily. Patient taking differently: Take 40 mg by mouth daily with lunch. 10/31/20 11/02/21 Yes Chandrasekhar, Mahesh A, MD  gabapentin (NEURONTIN) 100 MG capsule Take 1 capsule (100 mg total) by mouth at bedtime. 09/21/21  Yes Patel, Donika K, DO  glipiZIDE (GLUCOTROL) 5 MG tablet Take 5 mg by mouth 2 (two) times daily with breakfast and lunch.   Yes [provider]  lisinopril (ZESTRIL) 20 MG tablet Take 1 tablet by mouth once daily Patient taking differently: Take 20 mg by mouth daily with breakfast. 08/10/21  Yes [name], MD  metFORMIN (GLUCOPHAGE) 1000 MG tablet Take 1 tablet by mouth 2 (two) times daily with breakfast and lunch.   Yes [provider]  pantoprazole (PROTONIX) 40 MG tablet Take 40 mg by mouth daily.   Yes [provider]   rosuvastatin (CRESTOR) 5 MG tablet TAKE 1 TABLET BY MOUTH ONCE DAILY AT  6PM Patient taking differently: Take 5 mg by mouth every evening. 07/24/21  Yes Chandrasekhar, Mahesh A, MD  Selenium 200 MCG CAPS Take 200 mcg by mouth daily with breakfast.   Yes [provider]  Vitamin A 2400 MCG (8000 UT) TABS Take 2,400 mcg by mouth daily with breakfast.   Yes [provider]  vitamin B-12 (CYANOCOBALAMIN) 1000 MCG tablet Take 1,000 mcg by mouth 2 (two) times daily.   Yes [provider]  vitamin C (ASCORBIC ACID) 500 MG tablet Take 500 mg by mouth 2 (two) times daily.   Yes [provider]  warfarin (COUMADIN) 5 MG tablet Take 5 mg by mouth See admin instructions. Take one tablet (5 mg) by mouth on Sunday, Tuesday, Wednesday, Friday, Saturday at 7pm (take  7.5 mg tablet on Monday and Thursday)   Yes [provider]  warfarin (COUMADIN) 7.5 MG tablet Take 7.5 mg by mouth See admin instructions. Take one tablet (7.5 mg) by mouth on Monday and Thursday at 7pm (take 5 mg on all other days of the week)   Yes [provider]  zinc gluconate 50 MG tablet Take 50 mg by mouth daily with breakfast.   Yes [provider]  ACCU-CHEK AVIVA PLUS test strip USE TO CHECK BLOOD SUGARS 1 TO 2 TIMES A DAY 05/23/18   [provider]  ACCU-CHEK SOFTCLIX LANCETS lancets daily. use as directed 04/07/18   [provider]  clindamycin (CLEOCIN) 300 MG capsule Take 2 capsules (600 mg total) by mouth as needed. Take 1 hour prior to dental work Patient not taking: Reported on 10/01/2021 12/01/20   [name], MD  verapamil (CALAN-SR) 180 MG CR tablet Take 1 tablet (180 mg total) by mouth at bedtime. Patient not taking: Reported on 10/01/2021 07/27/21   [name], MD    Physical Exam: Constitutional: Moderately built and nourished. Vitals:   10/02/21 0330 10/02/21 0400 10/02/21 0430 10/02/21 0450  BP: 124/62 (!) 124/51 (!) 130/54 (!)  116/59  Pulse: 96 98 98 98  Resp: 18 16 16 19   Temp:   98 F (36.7 C) 98.1 F (36.7 C)  TempSrc:   Oral Oral  SpO2: 96% 96% 94%   Weight:      Height:       Eyes: Anicteric no pallor. ENMT: No discharge from the ears eyes nose and mouth. Neck: No mass felt.  No neck rigidity. Respiratory: No rhonchi or crepitations. Cardiovascular: S1-S2 heard. Abdomen: Soft nontender bowel sound present. Musculoskeletal: No edema. Skin: No rash.  There is dressing on the both shin from recent skin tear.  Ecchymotic area to forehead. Neurologic: Alert awake oriented to time place and person.  Moves all extremities. Psychiatric: Appears normal.  Normal affect.   Labs on Admission: I have personally reviewed following labs and imaging studies  CBC: Recent Labs  Lab 10/01/21 2019  WBC 6.6  HGB 6.4*  HCT 19.9*  MCV 101.5*  PLT 284   Basic Metabolic Panel: Recent Labs  Lab 10/01/21 2019  NA 135  K 4.6  CL 105  CO2 21*  GLUCOSE 108*  BUN 105*  CREATININE 1.26*  CALCIUM 8.9   GFR: Estimated Creatinine Clearance: 41.4 mL/min (A) (by C-G formula based on SCr of 1.26 mg/dL (H)). Liver Function Tests: No results for input(s): AST, ALT, ALKPHOS, BILITOT, PROT, ALBUMIN in the last 168 hours. No results for input(s): LIPASE, AMYLASE in the last 168 hours. No results for input(s): AMMONIA in the last 168 hours. Coagulation Profile: Recent Labs  Lab 10/01/21 2019  INR 7.2*   Cardiac Enzymes: No results for input(s): CKTOTAL, CKMB, CKMBINDEX, TROPONINI in the last 168 hours. BNP (last 3 results) Recent Labs    11/07/20 0752 11/22/20 1410  PROBNP 2,791* 3,129*   HbA1C: No results for input(s): HGBA1C in the last 72 hours. CBG: Recent Labs  Lab 10/01/21 1912  GLUCAP 94   Lipid Profile: No results for input(s): CHOL, HDL, LDLCALC, TRIG, CHOLHDL, LDLDIRECT in the last 72 hours. Thyroid Function Tests: No results for input(s): TSH, T4TOTAL, FREET4, T3FREE, THYROIDAB in the  last 72 hours. Anemia Panel: No results for input(s): VITAMINB12, FOLATE, FERRITIN, TIBC, IRON, RETICCTPCT in the last 72 hours. Urine analysis:    Component Value Date/Time  COLORURINE YELLOW 10/01/2021 [place] 10/01/2021 1837   LABSPEC 1.014 10/01/2021 1837   LABSPEC 1.010 02/05/2006 1006   PHURINE 5.0 10/01/2021 1837   GLUCOSEU 150 (A) 10/01/2021 1837   HGBUR NEGATIVE 10/01/2021 [place] 10/01/2021 1837   BILIRUBINUR Negative 02/05/2006 [place] 10/01/2021 1837   PROTEINUR NEGATIVE 10/01/2021 1837   UROBILINOGEN 2.0 (H) 01/15/2013 1411   NITRITE NEGATIVE 10/01/2021 1837   LEUKOCYTESUR NEGATIVE 10/01/2021 1837   LEUKOCYTESUR Negative 02/05/2006 1006   Sepsis Labs: @LABRCNTIP (procalcitonin:4,lacticidven:4) ) Recent Results (from the past 240 hour(s))  Resp Panel by RT-PCR (Flu A&B, Covid) Nasopharyngeal Swab     Status: None   Collection Time: 10/01/21  9:21 PM   Specimen: Nasopharyngeal Swab; Nasopharyngeal(NP) swabs in vial transport medium  Result Value Ref Range Status   SARS Coronavirus 2 by RT PCR NEGATIVE NEGATIVE Final    Comment: (NOTE) SARS-CoV-2 target nucleic acids are NOT DETECTED.  The SARS-CoV-2 RNA is generally detectable in upper respiratory specimens during the acute phase of infection. The lowest concentration of SARS-CoV-2 viral copies this assay can detect is 138 copies/mL. A negative result does not preclude SARS-Cov-2 infection and should not be used as the sole basis for treatment or other patient management decisions. A negative result may occur with  improper specimen collection/handling, submission of specimen other than nasopharyngeal swab, presence of viral mutation(s) within the areas targeted by this assay, and inadequate number of viral copies(<138 copies/mL). A negative result must be combined with clinical observations, patient history, and epidemiological information. The expected  result is Negative.  Fact Sheet for Patients:  EntrepreneurPulse.com.au  Fact Sheet for Healthcare Providers:  IncredibleEmployment.be  This test is no t yet approved or cleared by the [place] FDA and  has been authorized for detection and/or diagnosis of SARS-CoV-2 by FDA under an Emergency Use Authorization (EUA). This EUA will remain  in effect (meaning this test can be used) for the duration of the COVID-19 declaration under Section 564(b)(1) of the Act, 21 U.S.C.section 360bbb-3(b)(1), unless the authorization is terminated  or revoked sooner.       Influenza A by PCR NEGATIVE NEGATIVE Final   Influenza B by PCR NEGATIVE NEGATIVE Final    Comment: (NOTE) The Xpert Xpress SARS-CoV-2/FLU/RSV plus assay is intended as an aid in the diagnosis of influenza from Nasopharyngeal swab specimens and should not be used as a sole basis for treatment. Nasal washings and aspirates are unacceptable for Xpert Xpress SARS-CoV-2/FLU/RSV testing.  Fact Sheet for Patients: EntrepreneurPulse.com.au  Fact Sheet for Healthcare Providers: IncredibleEmployment.be  This test is not yet approved or cleared by the [place] FDA and has been authorized for detection and/or diagnosis of SARS-CoV-2 by FDA under an Emergency Use Authorization (EUA). This EUA will remain in effect (meaning this test can be used) for the duration of the COVID-19 declaration under Section 564(b)(1) of the Act, 21 U.S.C. section 360bbb-3(b)(1), unless the authorization is terminated or revoked.  Performed at [facility], [place] [address], [place], [place] [address]      Radiological Exams on Admission: CT Head Wo Contrast  Result Date: 10/01/2021 CLINICAL DATA:  Initial evaluation for acute headache, fall. EXAM: CT HEAD WITHOUT CONTRAST TECHNIQUE: Contiguous axial images were obtained from the base of the skull  through the vertex without intravenous contrast. COMPARISON:  None available. FINDINGS: Brain: Diffuse prominence of the CSF containing spaces compatible generalized age-related cerebral atrophy. Mild chronic microvascular ischemic  changes noted involving the supraorbital cerebral white matter. No acute intracranial hemorrhage. No acute large vessel territory infarct. No mass lesion. 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 Associated mild right-to-left shift of up to 4 mm. No hydrocephalus or trapping. Vascular: No hyperdense vessel. Scattered vascular calcifications noted within the carotid siphons. Skull: Scalp soft tissues and calvarium demonstrate no acute finding. Scattered vascular calcifications noted about the scalp. Sinuses/Orbits: Globes and orbital soft tissues demonstrate no acute finding. Paranasal sinuses are largely clear. No significant mastoid effusion. Other: None. IMPRESSION: 1. No acute intracranial abnormality. 2. 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 Associated mild right-to-left shift of up to 4 mm. No hydrocephalus or trapping. 3. Age-related cerebral atrophy with mild chronic small vessel ischemic disease. Electronically Signed   By: Jeannine Boga M.D.   On: 10/01/2021 21:17   DG Chest Port 1 View  Result Date: 10/01/2021 CLINICAL DATA:  Anemia. EXAM: PORTABLE CHEST 1 VIEW COMPARISON:  Chest x-ray 02/26/2013. FINDINGS: Cardiomediastinal silhouette is within normal limits for projection. The heart is enlarged, unchanged. Patient is status post TAVR. The lungs and costophrenic angles are clear. There is no pneumothorax. Degenerative changes affect both shoulders. IMPRESSION: 1. No acute cardiopulmonary process. 2. Cardiomegaly. Electronically Signed   By: Ronney Asters M.D.   On: 10/01/2021 21:48    EKG: Independently reviewed.   Atrial fibrillation rate controlled.  Assessment/Plan Principal Problem:   Acute GI bleeding Active Problems:   ATRIAL FIBRILLATION, CHRONIC   S/P aortic valve replacement with bioprosthetic valve   Chronic heart failure with preserved ejection fraction (HCC)   Diabetes mellitus with coincident hypertension (HCC)   Carcinoma of prostate ([place])   Esophageal stricture   Coagulopathy (HCC)    Acute GI bleeding in the setting of coagulopathy with INR of 7 -patient initial presentation was hypotensive improved with fluids 2 units of PRBC transfusion has been ordered.  Patient did receive vitamin K to reverse coagulopathy.  Dr. [name] gastroenterologist will be seeing patient in consult.  Patient did have EGD in September 2021 for esophageal dilatation.  We will keep patient on Protonix.  Repeat CBC with hydration. Acute blood loss anemia follow CBC after transfusion.  Will need anemia panel. Coagulopathy secondary to Coumadin vitamin K has been given to reverse. A. fib presently rate controlled.  Holding patient's carvedilol for now due to hypotensive at presentation.  Coumadin has been reversed due to acute GI bleed.  Patient agreeable. History of hypertension was hypotensive at presentation.  As needed IV hydralazine at this time for systolic more than 458. Diabetes mellitus type 2 we will keep patient sliding scale coverage. History of prostate cancer. Status post TAVR. Diastolic CHF presently holding Lasix due to hypotension at presentation. Recent fall with ecchymotic area on the forehead and skin tears to both lower extremities.  CT head is unremarkable repeat x-ray of the pelvis.   DVT prophylaxis: SCDs.  Avoiding anticoagulation in the setting of GI bleed. Code Status: Full code. Family Communication: We will need to discuss with family. Disposition Plan: Home. Consults called: Gastroenterology. Admission status: Observation.   Rise Patience MD [facility] Pager  [phone].  If 7PM-7AM, please contact night-coverage [email or web address] Password [facility]  10/02/2021, 4:54 AM

## 2021-10-02 NOTE — Consult Note (Signed)
[facility] Nurse Consult Note: Reason for Consult: pressure injury and bilateral LE ulcers Patient with unwitnessed fall at home 09/23/21; images reviewed from podiatry appointment at which time elbow and LE wounds were documented well  Wound type: RLE: skin tear/avulsion LLE; skin tear/avulsion  Right elbow; skin tear/avulsion  Per bedside nurses patient has MASD (moisture associated skin damage) and a pressure injury on the sacrum Pressure Injury POA: Yes Measurement: see nursing flow sheet  Will attempt to assess pressure injury in the morning. Not able to see patient today.  Ordered topical care for the skin tears after review of images.   [place], [place], [place]

## 2021-10-02 NOTE — Progress Notes (Addendum)
[facility] PROGRESS NOTE    Progress Note  [name]  ZOX:[id number] DOB: 1933-04-18 DOA: 10/01/2021 PCP: [name], MD     Brief Narrative:   [name] is an 87 y.o. male past medical history of atrial fibrillation on Coumadin status post TAVR's, chronic diastolic heart failure, essential hypertension diabetes mellitus type 2 with peripheral vascular disease and prostate cancer went to his primary care doctor labs were drawn and showed a significant drop in his hemoglobin to 6.4 (back in May 2022 was 12) patient denies any melanotic stools.  Was also hypotensive in the ER was given 2 units of packed red blood cells INR was around 7 was given vitamin K gastroenterology is Dr. Deno Etienne has been consulted.    Assessment/Plan:   Acute GI bleeding: INR was 7 in the ED, with a hemoglobin of 6 was given 2 units of packed red blood cells as she was also hypotensive. She was also given vitamin K.  His INR this morning is 2.4. Started on Protonix IV.  CBC posttransfusion was 7.6. Anemia panel is pending, FOBT is positive. Check a CBC tomorrow morning.  Coagulopathy: Due to Coumadin INR has been reversed with vitamin K this morning's INR is 2.4 (down from 7)   ATRIAL FIBRILLATION, CHRONIC Rate controlled, Coumadin has been held due to acute GI bleed.  Patient understands risk and benefits.  Essential hypertension: Start on IV hydralazine for systolic blood pressure greater than 160. If his blood pressure continues to trend up will need to restart his Coreg.  Diabetes mellitus type 2: Patient is currently n.p.o., blood glucose relatively well controlled, continue sliding scale insulin.  History of prostate cancer: Noted.  Status post TAVR's: Noted.  Chronic diastolic heart failure: Holding Lasix and Coreg due to hypotension his blood pressure is stabilized. Will continue to hold him for the next 24 hours as he is currently n.p.o. and blood pressure is  borderline.  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 Associated mild right-to-left shift of up to 4 mm. No hydrocephalus or trapping.    DVT prophylaxis: none Family Communication:none Status is: inpatient  The patient will require care spanning > 2 midnights and should be moved to inpatient because: Acute Gi bleed admitted for EGD.      Code Status:     Code Status Orders  (From admission, onward)           Start     Ordered   10/02/21 0453  Full code  Continuous        10/02/21 0453           Code Status History     Date Active Date Inactive Code Status Order ID Comments User Context   01/21/2013 0840 01/22/2013 1435 Full Code [id number]  [name], MD Inpatient   01/19/2013 1120 01/21/2013 0840 Full Code [id number]  [name], MD Inpatient      Advance Directive Documentation    Flowsheet Row Most Recent Value  Type of Advance Directive Healthcare Power of Stark, Living will  Pre-existing out of facility DNR order (yellow form or pink MOST form) --  "MOST" Form in Place? --         IV Access:   Peripheral IV   Procedures and diagnostic studies:   DG Pelvis 1-2 Views  Result Date: 10/02/2021 CLINICAL DATA:  Weakness fiducial markers EXAM: PELVIS - 1-2 VIEW COMPARISON:  None. FINDINGS: There is no evidence of pelvic fracture or diastasis. No pelvic bone lesions are seen. Noted in the prostate gland. IMPRESSION: Negative. Electronically Signed   By: Misty Stanley M.D.   On: 10/02/2021 07:26   CT Head Wo Contrast  Result Date: 10/01/2021 CLINICAL DATA:  Initial evaluation for acute headache, fall. EXAM: CT HEAD WITHOUT CONTRAST TECHNIQUE: Contiguous axial images were obtained from the base of the skull through the vertex without intravenous contrast. COMPARISON:  None available. FINDINGS:  Brain: Diffuse prominence of the CSF containing spaces compatible generalized age-related cerebral atrophy. Mild chronic microvascular ischemic changes noted involving the supraorbital cerebral white matter. No acute intracranial hemorrhage. No acute large vessel territory infarct. No mass lesion. 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 Associated mild right-to-left shift of up to 4 mm. No hydrocephalus or trapping. Vascular: No hyperdense vessel. Scattered vascular calcifications noted within the carotid siphons. Skull: Scalp soft tissues and calvarium demonstrate no acute finding. Scattered vascular calcifications noted about the scalp. Sinuses/Orbits: Globes and orbital soft tissues demonstrate no acute finding. Paranasal sinuses are largely clear. No significant mastoid effusion. Other: None. IMPRESSION: 1. No acute intracranial abnormality. 2. 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 Associated mild right-to-left shift of up to 4 mm. No hydrocephalus or trapping. 3. Age-related cerebral atrophy with mild chronic small vessel ischemic disease. Electronically Signed   By: Jeannine Boga M.D.   On: 10/01/2021 21:17   DG Chest Port 1 View  Result Date: 10/01/2021 CLINICAL DATA:  Anemia. EXAM: PORTABLE CHEST 1 VIEW COMPARISON:  Chest x-ray 02/26/2013. FINDINGS: Cardiomediastinal silhouette is within normal limits for projection. The heart is enlarged, unchanged. Patient is status post TAVR. The lungs and costophrenic angles are clear. There is no pneumothorax. Degenerative changes affect both shoulders. IMPRESSION: 1. No acute cardiopulmonary process. 2. Cardiomegaly. Electronically Signed   By: Ronney Asters M.D.   On: 10/01/2021 21:48     Medical Consultants:   None.   Subjective:    [name] cannot tell he had melanotic stools he is  currently asymptomatic.  Objective:    Vitals:   10/02/21 0530 10/02/21 0600 10/02/21 0731 10/02/21 0737  BP: 129/61 99/63 99/63  99/63  Pulse: 97 (!) 117 100 100  Resp: 18 (!) 21 18 18   Temp:   98 F (36.7 C) 98 F (36.7 C)  TempSrc:   Oral Oral  SpO2: 96% 96% 98% 98%  Weight:      Height:       SpO2: 98 %   Intake/Output Summary (Last 24 hours) at 10/02/2021 0805 Last data filed at 10/02/2021 0731 Gross per 24 hour  Intake 1618.48 ml  Output --  Net 1618.48 ml   Filed Weights   10/01/21 1829  Weight: 73 kg    Exam: General exam: In no acute distress. Respiratory system: Good air movement and clear to auscultation. Cardiovascular system: S1 & S2 heard, RRR. No JVD. Gastrointestinal system: Abdomen is nondistended, soft and nontender.  Extremities: No pedal edema. Skin: Bilateral shin ulcers with no erythema or warmth to touch appear to be healing. Psychiatry: Judgement and insight appear normal. Mood & affect appropriate.    Data Reviewed:    Labs: Basic Metabolic Panel: Recent Labs  Lab 10/01/21 2019 10/02/21 0613  NA 135 137  K 4.6  4.7  CL 105 108  CO2 21* 21*  GLUCOSE 108* 118*  BUN 105* 100*  CREATININE 1.26* 1.11  CALCIUM 8.9 8.6*   GFR Estimated Creatinine Clearance: 47 mL/min (by C-G formula based on SCr of 1.11 mg/dL). Liver Function Tests: Recent Labs  Lab 10/02/21 0613  AST 23  ALT 17  ALKPHOS 44  BILITOT 1.2  PROT 5.5*  ALBUMIN 2.9*   No results for input(s): LIPASE, AMYLASE in the last 168 hours. No results for input(s): AMMONIA in the last 168 hours. Coagulation profile Recent Labs  Lab 10/01/21 2019 10/02/21 0613  INR 7.2* 2.4*   COVID-19 Labs  No results for input(s): DDIMER, FERRITIN, LDH, CRP in the last 72 hours.  Lab Results  Component Value Date   SARSCOV2NAA NEGATIVE 10/01/2021   [place] NEGATIVE 07/29/2020    CBC: Recent Labs  Lab 10/01/21 2019 10/02/21 0613  WBC 6.6 7.3  NEUTROABS  --  5.6   HGB 6.4* 7.6*  HCT 19.9* 23.2*  MCV 101.5* 97.5  PLT 217 214   Cardiac Enzymes: No results for input(s): CKTOTAL, CKMB, CKMBINDEX, TROPONINI in the last 168 hours. BNP (last 3 results) Recent Labs    11/07/20 0752 11/22/20 1410  PROBNP 2,791* 3,129*   CBG: Recent Labs  Lab 10/01/21 1912 10/02/21 0552 10/02/21 0745  GLUCAP 94 107* 112*   D-Dimer: No results for input(s): DDIMER in the last 72 hours. Hgb A1c: No results for input(s): HGBA1C in the last 72 hours. Lipid Profile: No results for input(s): CHOL, HDL, LDLCALC, TRIG, CHOLHDL, LDLDIRECT in the last 72 hours. Thyroid function studies: No results for input(s): TSH, T4TOTAL, T3FREE, THYROIDAB in the last 72 hours.  Invalid input(s): FREET3 Anemia work up: No results for input(s): VITAMINB12, FOLATE, FERRITIN, TIBC, IRON, RETICCTPCT in the last 72 hours. Sepsis Labs: Recent Labs  Lab 10/01/21 2019 10/02/21 0613  WBC 6.6 7.3   Microbiology Recent Results (from the past 240 hour(s))  Resp Panel by RT-PCR (Flu A&B, Covid) Nasopharyngeal Swab     Status: None   Collection Time: 10/01/21  9:21 PM   Specimen: Nasopharyngeal Swab; Nasopharyngeal(NP) swabs in vial transport medium  Result Value Ref Range Status   SARS Coronavirus 2 by RT PCR NEGATIVE NEGATIVE Final    Comment: (NOTE) SARS-CoV-2 target nucleic acids are NOT DETECTED.  The SARS-CoV-2 RNA is generally detectable in upper respiratory specimens during the acute phase of infection. The lowest concentration of SARS-CoV-2 viral copies this assay can detect is 138 copies/mL. A negative result does not preclude SARS-Cov-2 infection and should not be used as the sole basis for treatment or other patient management decisions. A negative result may occur with  improper specimen collection/handling, submission of specimen other than nasopharyngeal swab, presence of viral mutation(s) within the areas targeted by this assay, and inadequate number of  viral copies(<138 copies/mL). A negative result must be combined with clinical observations, patient history, and epidemiological information. The expected result is Negative.  Fact Sheet for Patients:  EntrepreneurPulse.com.au  Fact Sheet for Healthcare Providers:  IncredibleEmployment.be  This test is no t yet approved or cleared by the [place] FDA and  has been authorized for detection and/or diagnosis of SARS-CoV-2 by FDA under an Emergency Use Authorization (EUA). This EUA will remain  in effect (meaning this test can be used) for the duration of the COVID-19 declaration under Section 564(b)(1) of the Act, 21 U.S.C.section 360bbb-3(b)(1), unless the authorization is terminated  or revoked sooner.  Influenza A by PCR NEGATIVE NEGATIVE Final   Influenza B by PCR NEGATIVE NEGATIVE Final    Comment: (NOTE) The Xpert Xpress SARS-CoV-2/FLU/RSV plus assay is intended as an aid in the diagnosis of influenza from Nasopharyngeal swab specimens and should not be used as a sole basis for treatment. Nasal washings and aspirates are unacceptable for Xpert Xpress SARS-CoV-2/FLU/RSV testing.  Fact Sheet for Patients: EntrepreneurPulse.com.au  Fact Sheet for Healthcare Providers: IncredibleEmployment.be  This test is not yet approved or cleared by the [place] FDA and has been authorized for detection and/or diagnosis of SARS-CoV-2 by FDA under an Emergency Use Authorization (EUA). This EUA will remain in effect (meaning this test can be used) for the duration of the COVID-19 declaration under Section 564(b)(1) of the Act, 21 U.S.C. section 360bbb-3(b)(1), unless the authorization is terminated or revoked.  Performed at [facility], [place] [address], [place], [place] [address]      Medications:    insulin aspart  0-9 Units Subcutaneous Q4H   pantoprazole (PROTONIX) IV  40  mg Intravenous Q12H   Continuous Infusions:  sodium chloride Stopped (10/01/21 2134)   sodium chloride 75 mL/hr at 10/02/21 0618      LOS: 0 days   Charlynne Cousins  [facility]  10/02/2021, 8:05 AM

## 2021-10-03 ENCOUNTER — Encounter (HOSPITAL_COMMUNITY): Payer: Self-pay | Admitting: Internal Medicine

## 2021-10-03 ENCOUNTER — Inpatient Hospital Stay (HOSPITAL_COMMUNITY): Payer: Medicare Other | Admitting: Anesthesiology

## 2021-10-03 ENCOUNTER — Encounter (HOSPITAL_COMMUNITY)
Admission: EM | Disposition: A | Discharge status: Skilled Nursing Facility | Payer: Self-pay | Source: Home / Self Care | Attending: Internal Medicine

## 2021-10-03 DIAGNOSIS — R42 Dizziness and giddiness: Secondary | ICD-10-CM

## 2021-10-03 DIAGNOSIS — L8990 Pressure ulcer of unspecified site, unspecified stage: Secondary | ICD-10-CM | POA: Diagnosis present

## 2021-10-03 HISTORY — PX: BIOPSY: SHX5522

## 2021-10-03 HISTORY — PX: ESOPHAGOGASTRODUODENOSCOPY: SHX5428

## 2021-10-03 LAB — BPAM RBC
Blood Product Expiration Date: 202212192359
Blood Product Expiration Date: 202212192359
ISSUE DATE / TIME: 202211290027
ISSUE DATE / TIME: 202211290416
Unit Type and Rh: 9500
Unit Type and Rh: 9500

## 2021-10-03 LAB — CBC
HCT: 26 % — ABNORMAL LOW (ref 39.0–52.0)
HCT: 25.4 % — ABNORMAL LOW (ref 39.0–52.0)
Hemoglobin: 8.7 g/dL — ABNORMAL LOW (ref 13.0–17.0)
Hemoglobin: 8.5 g/dL — ABNORMAL LOW (ref 13.0–17.0)
MCH: 32.3 pg (ref 26.0–34.0)
MCH: 32.7 pg (ref 26.0–34.0)
MCHC: 33.5 g/dL (ref 30.0–36.0)
MCHC: 33.5 g/dL (ref 30.0–36.0)
MCV: 96.7 fL (ref 80.0–100.0)
MCV: 97.7 fL (ref 80.0–100.0)
Platelets: 221 10*3/uL (ref 150–400)
Platelets: 222 10*3/uL (ref 150–400)
RBC: 2.69 MIL/uL — ABNORMAL LOW (ref 4.22–5.81)
RBC: 2.6 MIL/uL — ABNORMAL LOW (ref 4.22–5.81)
RDW: 16 % — ABNORMAL HIGH (ref 11.5–15.5)
RDW: 16 % — ABNORMAL HIGH (ref 11.5–15.5)
WBC: 11.3 10*3/uL — ABNORMAL HIGH (ref 4.0–10.5)
WBC: 14.5 10*3/uL — ABNORMAL HIGH (ref 4.0–10.5)
nRBC: 0.4 % — ABNORMAL HIGH (ref 0.0–0.2)
nRBC: 0.6 % — ABNORMAL HIGH (ref 0.0–0.2)

## 2021-10-03 LAB — PROTIME-INR
INR: 1.2 (ref 0.8–1.2)
Prothrombin Time: 14.9 seconds (ref 11.4–15.2)

## 2021-10-03 LAB — TYPE AND SCREEN
ABO/RH(D): O NEG
Antibody Screen: NEGATIVE
Unit division: 0
Unit division: 0

## 2021-10-03 LAB — BASIC METABOLIC PANEL
Anion gap: 11 (ref 5–15)
BUN: 64 mg/dL — ABNORMAL HIGH (ref 8–23)
CO2: 18 mmol/L — ABNORMAL LOW (ref 22–32)
Calcium: 8.8 mg/dL — ABNORMAL LOW (ref 8.9–10.3)
Chloride: 109 mmol/L (ref 98–111)
Creatinine, Ser: 1.08 mg/dL (ref 0.61–1.24)
GFR, Estimated: >60 mL/min (ref >60)
Glucose, Bld: 174 mg/dL — ABNORMAL HIGH (ref 70–99)
Potassium: 4.7 mmol/L (ref 3.5–5.1)
Sodium: 138 mmol/L (ref 135–145)

## 2021-10-03 LAB — GLUCOSE, CAPILLARY
Glucose-Capillary: 127 mg/dL — ABNORMAL HIGH (ref 70–99)
Glucose-Capillary: 154 mg/dL — ABNORMAL HIGH (ref 70–99)
Glucose-Capillary: 156 mg/dL — ABNORMAL HIGH (ref 70–99)
Glucose-Capillary: 160 mg/dL — ABNORMAL HIGH (ref 70–99)
Glucose-Capillary: 171 mg/dL — ABNORMAL HIGH (ref 70–99)
Glucose-Capillary: 180 mg/dL — ABNORMAL HIGH (ref 70–99)

## 2021-10-03 SURGERY — EGD (ESOPHAGOGASTRODUODENOSCOPY)
Anesthesia: Monitor Anesthesia Care

## 2021-10-03 MED ORDER — LIP MEDEX EX OINT
TOPICAL_OINTMENT | CUTANEOUS | Status: AC
  Filled 2021-10-03: qty 7

## 2021-10-03 MED ORDER — ONDANSETRON HCL 4 MG/2ML IJ SOLN
INTRAMUSCULAR | Status: DC | PRN
  Administered 2021-10-03: 4 mg via INTRAVENOUS

## 2021-10-03 MED ORDER — CARVEDILOL 3.125 MG PO TABS
3.1250 mg | ORAL_TABLET | Freq: Two times a day (BID) | ORAL | Status: DC
  Administered 2021-10-04 – 2021-10-06 (×6): 3.125 mg via ORAL
  Filled 2021-10-03 (×6): qty 1

## 2021-10-03 MED ORDER — CARVEDILOL 12.5 MG PO TABS
12.5000 mg | ORAL_TABLET | Freq: Two times a day (BID) | ORAL | Status: DC
  Administered 2021-10-03: 12.5 mg via ORAL
  Filled 2021-10-03: qty 1

## 2021-10-03 MED ORDER — PROPOFOL 500 MG/50ML IV EMUL
INTRAVENOUS | Status: DC | PRN
Start: 2021-10-03 — End: 2021-10-03
  Administered 2021-10-03: 100 ug/kg/min via INTRAVENOUS

## 2021-10-03 MED ORDER — BOOST / RESOURCE BREEZE PO LIQD CUSTOM
1.0000 | Freq: Three times a day (TID) | ORAL | Status: DC
  Administered 2021-10-03 – 2021-10-06 (×4): 1 via ORAL

## 2021-10-03 MED ORDER — LACTATED RINGERS IV SOLN: INTRAVENOUS | Status: DC | PRN

## 2021-10-03 MED ORDER — INSULIN ASPART 100 UNIT/ML IJ SOLN
0.0000 [IU] | Freq: Three times a day (TID) | INTRAMUSCULAR | Status: DC
  Administered 2021-10-06 – 2021-10-07 (×2): 7 [IU] via SUBCUTANEOUS
  Administered 2021-10-07 – 2021-10-08 (×3): 3 [IU] via SUBCUTANEOUS

## 2021-10-03 MED ORDER — PROPOFOL 500 MG/50ML IV EMUL
INTRAVENOUS | Status: AC
  Filled 2021-10-03: qty 50

## 2021-10-03 MED ORDER — PROPOFOL 10 MG/ML IV BOLUS
INTRAVENOUS | Status: DC | PRN
  Administered 2021-10-03 (×2): 15 mg via INTRAVENOUS

## 2021-10-03 MED ORDER — SODIUM CHLORIDE 0.9 % IV SOLN: INTRAVENOUS | Status: DC

## 2021-10-03 MED ORDER — JUVEN PO PACK
1.0000 | PACK | Freq: Two times a day (BID) | ORAL | Status: DC
  Administered 2021-10-04 – 2021-10-06 (×4): 1 via ORAL
  Filled 2021-10-03 (×8): qty 1

## 2021-10-03 MED ORDER — PHENYLEPHRINE 40 MCG/ML (10ML) SYRINGE FOR IV PUSH (FOR BLOOD PRESSURE SUPPORT)
PREFILLED_SYRINGE | INTRAVENOUS | Status: DC | PRN
  Administered 2021-10-03 (×2): 120 ug via INTRAVENOUS

## 2021-10-03 MED ORDER — ADULT MULTIVITAMIN W/MINERALS CH
1.0000 | ORAL_TABLET | Freq: Every day | ORAL | Status: DC
  Administered 2021-10-04 – 2021-10-06 (×3): 1 via ORAL
  Filled 2021-10-03 (×3): qty 1

## 2021-10-03 MED ORDER — INSULIN ASPART 100 UNIT/ML IJ SOLN
0.0000 [IU] | Freq: Every day | INTRAMUSCULAR | Status: DC
Start: 2021-10-03 — End: 2021-10-08

## 2021-10-03 NOTE — Assessment & Plan Note (Addendum)
Prior history.  Outpatient follow-up. Xtandi on hold due to anemia. Recheck CBC in 1 week and resume.

## 2021-10-03 NOTE — Progress Notes (Signed)
[name] 12:29 PM  Subjective: Patient doing well without signs of bleeding and did have some bowel movements yesterday with his MiraLAX and has no new complaints  Objective: Vital signs stable afebrile no acute distress exam please see preassessment evaluation labs stable  Assessment: Subacute GI blood loss patient on Coumadin  Plan: Okay to proceed with endoscopy and possible enteroscopy with anesthesia assistance  [facility]  office [phone] After 5PM or if no answer call [phone]

## 2021-10-03 NOTE — Assessment & Plan Note (Addendum)
Presents with anemia.  Hemoglobin 6.2 on admission. EGD 11/30 shows nonbleeding esophageal ulcer. Patient is not on any aspirin prior to admission. GI Would recommend to hold off on anticoagulation on discharge Continue PPI.  Change to carb modified diet. Return to GI clinic in 1 month  GI clinic will call for pathology results.

## 2021-10-03 NOTE — Progress Notes (Signed)
PT Cancellation Note  Patient Details Name: [name] MRN: [id number] DOB: [birth year]   Cancelled Treatment:    Reason Eval/Treat Not Completed: Other (comment) (attempted x 2 this afternoon, pt being cleaned up by nursing. Will follow.)  Philomena Doheny PT 10/03/2021  Acute Rehabilitation Services Pager [phone] Office [phone]

## 2021-10-03 NOTE — Progress Notes (Signed)
[facility] Progress Note  Patient: [name]    GQQ:[id number]  DOA: 10/01/2021    Date of Service: the patient was seen and examined on 10/03/2021  Brief hospital course: No notes on file  Assessment and Plan: * Acute GI bleeding Presents with anemia.  Hemoglobin 6.2 on admission. EGD 11/30 shows nonbleeding esophageal ulcer biopsy.  Otherwise normal. GI recommends soft diet today, need to reassess blood thinner as well as aspirin. Continue PPI. Return to GI clinic in 1 month GI clinic will call for pathology results.  Acute blood loss anemia, symptomatic secondary to GI bleed SP 2 PRBC transfusion. Patient had supratherapeutic INR which was reversed. Monitor CBC as the patient actually had some melena in the evening again. INR remains subtherapeutic. Transfuse for hemoglobin less than 7.   Dizziness With RVR. No focal deficit at the time of my evaluation. Dizziness resolved with RVR resolution. PT OT consulted. Blood pressure relatively soft.  Monitor.  Coagulopathy (HCC) INR was supratherapeutic with bleeding. Currently INR is subtherapeutic after receiving vitamin K.  Chronic heart failure with preserved ejection fraction (HCC) No evidence of volume overload. Blood pressure relatively soft.  Holding Lasix. Monitor.  S/P aortic valve replacement with bioprosthetic valve Monitor.  Chronic atrial fibrillation with RVR (HCC) On Coumadin with anticoagulation. Reversed with vitamin K. INR remains subtherapeutic. Rate controlled with Coreg.  Diabetic retinopathy [facility]) Outpatient follow-up.  Diabetic neuropathy (HCC) On gabapentin.  Currently holding.  Diabetes mellitus with coincident hypertension (HCC) Hemoglobin A1c controlled. Currently on sliding scale insulin.  Monitor.  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  Continue foam dressing.  Carcinoma of prostate [facility]) Prior history.  Outpatient follow-up.    Body mass index is  24.11 kg/m.  Nutrition Problem: Increased nutrient needs Etiology: wound healing Pressure Injury 10/02/21 Coccyx Medial Stage 3 -  Full thickness tissue loss. Subcutaneous fat may be visible but bone, tendon or muscle are NOT exposed. (Active)  10/02/21 1425  Location: Coccyx  Location Orientation: Medial  Staging: Stage 3 -  Full thickness tissue loss. Subcutaneous fat may be visible but bone, tendon or muscle are NOT exposed.  Wound Description (Comments):   Present on Admission: Yes     Subjective: Reported dizziness earlier in the morning when the heart rate was faster.  Currently dizziness resolved.  No other focal deficit.  No chest pain no abdominal pain. Later in the evening had a large BM with black color stool.  Objective: Remains with RVR with blood pressure remaining on the softer side.  Exam: General: Appear in mild distress, no Rash; Oral Mucosa Clear, moist. no Abnormal Neck Mass Or lumps, Conjunctiva normal  Cardiovascular: S1 and S2 Present, no Murmur, Respiratory: increased respiratory effort, Bilateral Air entry present and bilateral  Crackles, no wheezes Abdomen: Bowel Sound present, Soft and no tenderness Extremities: trace Pedal edema Neurology: alert and oriented to time, place, and person affect appropriate. no new focal deficit Gait not checked due to patient safety concerns    Data Reviewed: Stable hemoglobin and electrolytes.  Disposition:  Status is: Inpatient  Remains inpatient appropriate because: Need to close monitoring due to ongoing concern for GI bleed.   Family Communication: Family at bedside.  Daughter.  DVT Prophylaxis: SCDs Start: 10/02/21 0452   Time spent: 35 minutes.   Author: Berle Mull  10/03/2021 7:02 PM  To reach On-call, see care teams to locate the attending and reach out via [email or web address]. Between 7PM-7AM, please contact night-coverage If you still  have difficulty reaching the attending provider, please page the  [facility] (Director on Call) for [facility] on amion for assistance.

## 2021-10-03 NOTE — Progress Notes (Signed)
   10/03/21 0836  Vitals  BP 115/67  MAP (mmHg) 81  BP Method Automatic  Pulse Rate 99  Pulse Rate Source Monitor  MEWS COLOR  MEWS Score Color Green  Oxygen Therapy  SpO2 96 %  Pain Assessment  Pain Scale 0-10  Pain Score 0  MEWS Score  MEWS Temp 0  MEWS Systolic 0  MEWS Pulse 0  MEWS RR 1  MEWS LOC 0  MEWS Score 1  Provider Notification  Provider Name/Title Patel  Date Provider Notified 10/03/21  Time Provider Notified [phone]  Notification Type Page  Notification Reason Change in status  Provider response See new orders  Date of Provider Response 10/03/21  Time of Provider Response [phone]     Patient noted to be in a yellow mews due to HR. MD and charge nurse notified. PRN metroprolol administered. Will follow pressures and HR. See new orders

## 2021-10-03 NOTE — Assessment & Plan Note (Signed)
INR was supratherapeutic with bleeding. Currently INR is subtherapeutic after receiving vitamin K.

## 2021-10-03 NOTE — Assessment & Plan Note (Addendum)
Hemoglobin A1c controlled. Was on sliding scale. Will resume home regimen.

## 2021-10-03 NOTE — Consult Note (Signed)
WOC follow up this am, Stage 3 Pressure Injury now documented on nursing flow sheet. Measurements: 0.4cm x 0.2cm x 0cm; clean, pink, no drainage. Silicone foam implemented appropriately. No other topical care needed.  Will order chair pressure redistribution pad for when up in the chair and to be sent home with patient for use at home.   Discussed POC with bedside nurse.  Re consult if needed, will not follow at this time. Thanks  Tahnee Cifuentes [employer], RN,CWOCN, CNS, [place] [phone])

## 2021-10-03 NOTE — Progress Notes (Signed)
Patients HR noted to be 125 BP 140/80 PRN IV metoprolol was administered. Coreg 12.5 mg was ordered by MD. Notified MD PRN IV metoprolol was given and was advised to give coreg as well. Coreg administered. Will follow pressures.

## 2021-10-03 NOTE — Anesthesia Preprocedure Evaluation (Addendum)
Anesthesia Evaluation  Patient identified by MRN, date of birth, ID band Patient awake    Reviewed: Allergy & Precautions, NPO status , Patient's Chart, lab work & pertinent test results  History of Anesthesia Complications Negative for: history of anesthetic complications  Airway Mallampati: I  TM Distance: >3 FB Neck ROM: Full    Dental  (+) Edentulous Upper, Edentulous Lower, Dental Advisory Given   Pulmonary former smoker,    Pulmonary exam normal        Cardiovascular hypertension, + Peripheral Vascular Disease  + dysrhythmias Atrial Fibrillation  Rhythm:Irregular Rate:Tachycardia  S/P Tavr Echo 1. Left ventricular ejection fraction, by estimation, is 55 to 60%. The  left ventricle has normal function. The left ventricle has no regional  wall motion abnormalities. There is moderate left ventricular hypertrophy.  Left ventricular diastolic  parameters are indeterminate.  2. Right ventricular systolic function is mildly reduced. The right  ventricular size is normal. There is severely elevated pulmonary artery  systolic pressure. The estimated right ventricular systolic pressure is  29.9 mmHg.  3. Left atrial size was moderately dilated.  4. Right atrial size was moderately dilated.  5. The mitral valve is abnormal, there is restriction of the posterior  leaflet. Moderate mitral valve regurgitation with PISA ERO 0.25 cm^2. No  evidence of mitral stenosis. Moderate mitral annular calcification.  6. Tricuspid valve regurgitation is moderate.  7. S/p TAVR with 26 mm Edwards Sapien bioprosthesis. Mean gradient 8  mmHg, no evidence for significant stenosis. No significant peri-valvular  leakage noted.  8. The inferior vena cava is dilated in size with >50% respiratory  variability, suggesting right atrial pressure of 8 mmHg.  9. The patient was in atrial fibrillation.   Comparison(s): 03/16/15 EF 55-60%. AV 40mHg  mean PG. PA pressure 441mg.    Neuro/Psych negative neurological ROS  negative psych ROS   GI/Hepatic Neg liver ROS,   Endo/Other  diabetes  Renal/GU   negative genitourinary   Musculoskeletal   Abdominal   Peds  Hematology  (+) anemia ,   Anesthesia Other Findings   Reproductive/Obstetrics                            Anesthesia Physical Anesthesia Plan  ASA: 3  Anesthesia Plan: MAC   Post-op Pain Management:    Induction:   PONV Risk Score and Plan: 1 and Ondansetron and Propofol infusion  Airway Management Planned: Natural Airway, Simple Face Mask and Nasal Cannula  Additional Equipment:   Intra-op Plan:   Post-operative Plan:   Informed Consent: I have reviewed the patients History and Physical, chart, labs and discussed the procedure including the risks, benefits and alternatives for the proposed anesthesia with the patient or authorized representative who has indicated his/her understanding and acceptance.     Dental advisory given  Plan Discussed with: Anesthesiologist and CRNA  Anesthesia Plan Comments:        Anesthesia Quick Evaluation

## 2021-10-03 NOTE — Assessment & Plan Note (Addendum)
No evidence of volume overload. Resume home lasix.

## 2021-10-03 NOTE — Assessment & Plan Note (Addendum)
Patient was on Coumadin and Coreg. Supratherapeutic INR reversed with vitamin K. Rate controlled with Coreg.  Switch to Toprol-XL for ease of administration. Not a good candidate for resumption of anticoagulation.  Daughter currently agrees.

## 2021-10-03 NOTE — Transfer of Care (Signed)
Immediate Anesthesia Transfer of Care Note  Patient: [name]  Procedure(s) Performed: ESOPHAGOGASTRODUODENOSCOPY (EGD) BIOPSY  Patient Location: Endoscopy Unit  Anesthesia Type:MAC  Level of Consciousness: awake, drowsy and patient cooperative  Airway & Oxygen Therapy: Patient Spontanous Breathing and Patient connected to face mask  Post-op Assessment: Report given to RN and Post -op Vital signs reviewed and stable  Post vital signs: Reviewed and stable  Last Vitals:  Vitals Value Taken Time  BP 91/49 10/03/21 1257  Temp    Pulse 105 10/03/21 1255  Resp 21 10/03/21 1259  SpO2 91 % 10/03/21 1255  Vitals shown include unvalidated device data.  Last Pain:  Vitals:   10/03/21 1255  TempSrc: Axillary  PainSc: 0-No pain      Patients Stated Pain Goal: 2 (28/20/60 1561)  Complications: No notable events documented.

## 2021-10-03 NOTE — Assessment & Plan Note (Addendum)
Associated with RVR. No focal deficit at the time of my evaluation. Dizziness resolved with RVR resolution. PT OT consulted.

## 2021-10-03 NOTE — Assessment & Plan Note (Signed)
-   Outpatient follow-up °

## 2021-10-03 NOTE — Assessment & Plan Note (Signed)
Stage III medial coccyx.  Continue foam dressing.

## 2021-10-03 NOTE — Assessment & Plan Note (Addendum)
On gabapentin.  Resume

## 2021-10-03 NOTE — Progress Notes (Signed)
Patient noted to have large melenic stool. MD paged. Charged nurse notified. Vitals obtained and in chart.

## 2021-10-03 NOTE — Progress Notes (Signed)
Attempted to call Dondra Spry (daughter and emergency contact) 3 times to update on patient status and the ending of procedure.Calls went straight to voicemail, unable to update daughter. Will try again shortly.

## 2021-10-03 NOTE — Progress Notes (Signed)
Initial Nutrition Assessment  INTERVENTION:   Once diet advanced: -Boost Breeze po TID, each supplement provides 250 kcal and 9 grams of protein -Juven BID, each serving provides 95kcal and 2.5g of protein (amino acids glutamine and arginine) -Multivitamin with minerals daily  NUTRITION DIAGNOSIS:   Increased nutrient needs related to wound healing as evidenced by estimated needs.  GOAL:   Patient will meet greater than or equal to 90% of their needs  MONITOR:   Diet advancement, PO intake, Labs, Weight trends, I & O's, Skin  REASON FOR ASSESSMENT:    (wounds)   ASSESSMENT:   87 y.o. male with history of atrial fibrillation, status post TAVR, diastolic CHF, hypertension, diabetes mellitus, peripheral vascular disease, prostate cancer routine follow-up with his primary care physician and had blood work done which showed significant fall in his hemoglobin.  Patient was being taken to endoscopy at time of visit. Pt NPO today for EGD and enteroscope.  Per chart review, pt has been having constipation and GI bleeding.  Will order supplements to aid in healing of stage 3 pressure injury.  Per weight records, pt has lost 21 lbs since 10/31/20 (11% wt loss x 11 months, insignificant for time frame).    Medications reviewed.  Labs reviewed:  CBGs: 95-171  NUTRITION - FOCUSED PHYSICAL EXAM:  Unable to complete  Diet Order:   Diet Order             Diet NPO time specified Except for: Ice Chips, Sips with Meds  Diet effective now                   EDUCATION NEEDS:   No education needs have been identified at this time  Skin:  Skin Assessment: Skin Integrity Issues: Skin Integrity Issues:: Stage III, Other (Comment) Stage III: medial coccyx Other: venous stasis ulcer on L & R pretibial  Last BM:  11/29 -type 6  Height:   Ht Readings from Last 1 Encounters:  10/03/21 5' 8.5" (1.74 m)    Weight:   Wt Readings from Last 1 Encounters:  10/03/21 73 kg     BMI:  Body mass index is 24.11 kg/m.  Estimated Nutritional Needs:   Kcal:  1900-2100  Protein:  90-105g  Fluid:  2L/day  Clayton Bibles, MS, RD, LDN Inpatient Clinical Dietitian Contact information available via Amion

## 2021-10-03 NOTE — Assessment & Plan Note (Addendum)
Outpatient echocardiogram planned by cardiology. Follow up as recommended by Cardiology.

## 2021-10-03 NOTE — Anesthesia Postprocedure Evaluation (Signed)
Anesthesia Post Note  Patient: [name]  Procedure(s) Performed: ESOPHAGOGASTRODUODENOSCOPY (EGD) BIOPSY     Patient location during evaluation: PACU Anesthesia Type: MAC Level of consciousness: awake and alert Pain management: pain level controlled Vital Signs Assessment: post-procedure vital signs reviewed and stable Respiratory status: spontaneous breathing and respiratory function stable Cardiovascular status: stable Postop Assessment: no apparent nausea or vomiting Anesthetic complications: no   No notable events documented.  Last Vitals:  Vitals:   10/03/21 1343 10/03/21 1504  BP: 115/61 110/64  Pulse: 94 84  Resp: 20 18  Temp: 37.2 C 37.2 C  SpO2: 94% 97%    Last Pain:  Vitals:   10/03/21 1343  TempSrc: Oral  PainSc:                  Shaketta Rill DANIEL

## 2021-10-03 NOTE — Assessment & Plan Note (Addendum)
Hemoglobin now stable.SP 2 PRBC transfusion.

## 2021-10-03 NOTE — Op Note (Signed)
[facility] Patient Name: [name] Procedure Date: 10/03/2021 MRN: [id number] Attending MD: [name] , MD Date of Birth: 06-03-1933 CSN: [id number] Age: 87 Admit Type: Inpatient Procedure:                Upper GI endoscopy Indications:              Melena symptomatic anemia Providers:                [name], MD, Burtis Junes, RN, Tyna Jaksch                            Technician Referring MD:              Medicines:                Propofol total dose 341 mg IV Complications:            No immediate complications. Estimated Blood Loss:     Estimated blood loss: none. Procedure:                Pre-Anesthesia Assessment:                           - Prior to the procedure, a History and Physical                            was performed, and patient medications and                            allergies were reviewed. The patient's tolerance of                            previous anesthesia was also reviewed. The risks                            and benefits of the procedure and the sedation                            options and risks were discussed with the patient.                            All questions were answered, and informed consent                            was obtained. Prior Anticoagulants: The patient has                            taken Coumadin (warfarin), last dose was 3 days                            prior to procedure. ASA Grade Assessment: III - A                            patient with severe systemic disease. After  reviewing the risks and benefits, the patient was                            deemed in satisfactory condition to undergo the                            procedure.                           After obtaining informed consent, the endoscope was                            passed under direct vision. Throughout the                            procedure, the patient's blood pressure, pulse, and                             oxygen saturations were monitored continuously. The                            [id number]-HQ190L ([id number]) Olympus colonoscope was                            introduced through the mouth and advanced to the                            third part of duodenum. The [id number]-[id number] ([id number])                            Olympus endoscope was introduced through the and                            advanced to the. The upper GI endoscopy was                            accomplished without difficulty. The patient                            tolerated the procedure well. Scope In: Scope Out: Findings:      A Tiny hiatal hernia was present.      Few non-bleeding superficial gastric ulcers with no stigmata of bleeding       were found in the distal gastric body and in the proximal gastric       antrum. 2 biopsies were taken of each ulcerated area with a cold forceps       for histology without signs of significant bleeding.      The ampulla, duodenal bulb, first portion of the duodenum, second       portion of the duodenum, major papilla, area of the papilla and third       portion of the duodenum were normal.      The exam was otherwise without abnormality. Impression:               - Tiny hiatal hernia.                           -  Non-bleeding gastric ulcers with no stigmata of                            bleeding. Biopsied.                           - Normal ampulla, duodenal bulb, first portion of                            the duodenum, second portion of the duodenum, major                            papilla, area of the papilla and third portion of                            the duodenum.                           - The examination was otherwise normal. Moderate Sedation:      Not Applicable - Patient had care per Anesthesia. Recommendation:           - Soft diet today.                           - Continue present medications. Will need to                            reassess with blood thinner  needs as well as                            determine whether he has been on an aspirin at home                            or not but I would not want him on any aspirin and                            continue pump inhibitors                           - Await pathology results.                           - Return to GI clinic in 1 month. And we will                            decide if repeat endoscopy to document healing is                            warranted as well as consideration of any further                            work-up and plans                           -  Telephone GI clinic for pathology results in 1                            week.                           - Telephone GI clinic if symptomatic PRN. Procedure Code(s):        --- Professional ---                           [id number], Esophagogastroduodenoscopy, flexible,                            transoral; with biopsy, single or multiple Diagnosis Code(s):        --- Professional ---                           K44.9, Diaphragmatic hernia without obstruction or                            gangrene                           K25.9, Gastric ulcer, unspecified as acute or                            chronic, without hemorrhage or perforation                           K92.1, Melena (includes Hematochezia) CPT copyright 2019 American Medical Association. All rights reserved. The codes documented in this report are preliminary and upon coder review may  be revised to meet current compliance requirements. [name], MD 10/03/2021 1:21:37 PM This report has been signed electronically. Number of Addenda: 0

## 2021-10-04 ENCOUNTER — Encounter (HOSPITAL_COMMUNITY): Payer: Self-pay

## 2021-10-04 ENCOUNTER — Ambulatory Visit (HOSPITAL_COMMUNITY): Payer: Medicare Other

## 2021-10-04 ENCOUNTER — Encounter (HOSPITAL_COMMUNITY): Payer: Self-pay | Admitting: Internal Medicine

## 2021-10-04 LAB — CBC
HCT: 25.8 % — ABNORMAL LOW (ref 39.0–52.0)
HCT: 26.6 % — ABNORMAL LOW (ref 39.0–52.0)
Hemoglobin: 8.4 g/dL — ABNORMAL LOW (ref 13.0–17.0)
Hemoglobin: 8.2 g/dL — ABNORMAL LOW (ref 13.0–17.0)
MCH: 32.3 pg (ref 26.0–34.0)
MCH: 31.7 pg (ref 26.0–34.0)
MCHC: 32.6 g/dL (ref 30.0–36.0)
MCHC: 30.8 g/dL (ref 30.0–36.0)
MCV: 99.2 fL (ref 80.0–100.0)
MCV: 102.7 fL — ABNORMAL HIGH (ref 80.0–100.0)
Platelets: 231 10*3/uL (ref 150–400)
Platelets: 249 10*3/uL (ref 150–400)
RBC: 2.6 MIL/uL — ABNORMAL LOW (ref 4.22–5.81)
RBC: 2.59 MIL/uL — ABNORMAL LOW (ref 4.22–5.81)
RDW: 16.1 % — ABNORMAL HIGH (ref 11.5–15.5)
RDW: 16.3 % — ABNORMAL HIGH (ref 11.5–15.5)
WBC: 11.7 10*3/uL — ABNORMAL HIGH (ref 4.0–10.5)
WBC: 11.9 10*3/uL — ABNORMAL HIGH (ref 4.0–10.5)
nRBC: 0.9 % — ABNORMAL HIGH (ref 0.0–0.2)
nRBC: 1.3 % — ABNORMAL HIGH (ref 0.0–0.2)

## 2021-10-04 LAB — BASIC METABOLIC PANEL
Anion gap: 6 (ref 5–15)
BUN: 55 mg/dL — ABNORMAL HIGH (ref 8–23)
CO2: 19 mmol/L — ABNORMAL LOW (ref 22–32)
Calcium: 8.2 mg/dL — ABNORMAL LOW (ref 8.9–10.3)
Chloride: 111 mmol/L (ref 98–111)
Creatinine, Ser: 0.98 mg/dL (ref 0.61–1.24)
GFR, Estimated: >60 mL/min (ref >60)
Glucose, Bld: 220 mg/dL — ABNORMAL HIGH (ref 70–99)
Potassium: 4.2 mmol/L (ref 3.5–5.1)
Sodium: 136 mmol/L (ref 135–145)

## 2021-10-04 LAB — IRON AND TIBC
Iron: 25 ug/dL — ABNORMAL LOW (ref 45–182)
Saturation Ratios: 8 % — ABNORMAL LOW (ref 17.9–39.5)
TIBC: 325 ug/dL (ref 250–450)
UIBC: 300 ug/dL

## 2021-10-04 LAB — GLUCOSE, CAPILLARY
Glucose-Capillary: 188 mg/dL — ABNORMAL HIGH (ref 70–99)
Glucose-Capillary: 213 mg/dL — ABNORMAL HIGH (ref 70–99)
Glucose-Capillary: 255 mg/dL — ABNORMAL HIGH (ref 70–99)
Glucose-Capillary: 248 mg/dL — ABNORMAL HIGH (ref 70–99)

## 2021-10-04 LAB — SURGICAL PATHOLOGY

## 2021-10-04 LAB — FOLATE: Folate: 32.7 ng/mL (ref >5.9)

## 2021-10-04 LAB — PROTIME-INR
INR: 1.3 — ABNORMAL HIGH (ref 0.8–1.2)
Prothrombin Time: 15.8 seconds — ABNORMAL HIGH (ref 11.4–15.2)

## 2021-10-04 LAB — VITAMIN B12: Vitamin B-12: 1295 pg/mL — ABNORMAL HIGH (ref 180–914)

## 2021-10-04 NOTE — Hospital Course (Addendum)
11/29 presents to the hospital with GI bleed with supratherapeutic INR reversed with vitamin K.  Received 2 PRBC transfusion. 11/30 underwent EGD nonbleeding esophageal ulcer.  Continues to have melena. 12/1 hemoglobin remaining stable.   12/3 medically stable.  Now awaiting placement on Monday.

## 2021-10-04 NOTE — Evaluation (Signed)
Physical Therapy Evaluation Patient Details Name: [name] MRN: [id number] DOB: [birth year] Today's Date: 10/04/2021  History of Present Illness  87 y.o. male seen for routine follow-up with his primary care physician and had blood work done which showed significant fall in his hemoglobin, advised to come to ER. Dx of GIB. Pt with history of atrial fibrillation, status post TAVR, diastolic CHF, hypertension, diabetes mellitus, peripheral vascular disease, prostate cancer  Clinical Impression  The patient  reports that he is home alone, wife in rehab facility . Daughter available  intermittently/works.  Patient did  ambulate in room  with RW and should progress  , unsure if he will  return to independent level while in hospital. Continue PT for mobility to return to independence. May benefit from short rehab stay if  patient will be home alone. BP post ambulation 137/72      Recommendations for follow up therapy are one component of a multi-disciplinary discharge planning process, led by the attending physician.  Recommendations may be updated based on patient status, additional functional criteria and insurance authorization.  Follow Up Recommendations Skilled nursing-short term rehab (<3 hours/day)    Assistance Recommended at Discharge Intermittent Supervision/Assistance  Functional Status Assessment Patient has had a recent decline in their functional status and demonstrates the ability to make significant improvements in function in a reasonable and predictable amount of time.  Equipment Recommendations  Rolling walker (2 wheels)    Recommendations for Other Services       Precautions / Restrictions Precautions Precautions: Fall      Mobility  Bed Mobility Overal bed mobility: Needs Assistance Bed Mobility: Supine to Sit     Supine to sit: Min guard;HOB elevated     General bed mobility comments: no asssistance    Transfers Overall transfer level: Needs  assistance Equipment used: Rolling walker (2 wheels) Transfers: Sit to/from Stand Sit to Stand: Min assist           General transfer comment: use of Rw,    Ambulation/Gait Ambulation/Gait assistance: Min Web designer (Feet): 25 Feet Assistive device: Rolling walker (2 wheels) Gait Pattern/deviations: Step-to pattern;Step-through pattern Gait velocity: decr     General Gait Details: steady assistance to turn arouund in room  Stairs            Wheelchair Mobility    Modified Rankin (Stroke Patients Only)       Balance Overall balance assessment: Mild deficits observed, not formally tested                                           Pertinent Vitals/Pain Pain Assessment: No/denies pain    Home Living Family/patient expects to be discharged to:: Private residence Living Arrangements: Alone Available Help at Discharge: Available PRN/intermittently Type of Home: House Home Access: Level entry       Home Layout: One level Home Equipment: None Additional Comments: wife at [employer]    Prior Function Prior Level of Function : Independent/Modified Independent                     Hand Dominance        Extremity/Trunk Assessment   Upper Extremity Assessment Upper Extremity Assessment: Overall WFL for tasks assessed    Lower Extremity Assessment Lower Extremity Assessment: Generalized weakness    Cervical / Trunk Assessment Cervical / Trunk Assessment: Kyphotic  Communication   Communication: No difficulties  Cognition Arousal/Alertness: Awake/alert Behavior During Therapy: WFL for tasks assessed/performed Overall Cognitive Status: Within Functional Limits for tasks assessed                                          General Comments      Exercises     Assessment/Plan    PT Assessment Patient needs continued PT services  PT Problem List Decreased strength;Decreased mobility;Decreased safety  awareness;Decreased activity tolerance;Decreased knowledge of use of DME;Decreased knowledge of precautions       PT Treatment Interventions DME instruction;Therapeutic activities;Gait training;Therapeutic exercise;Patient/family education;Functional mobility training    PT Goals (Current goals can be found in the Care Plan section)  Acute Rehab PT Goals Patient Stated Goal: to go home PT Goal Formulation: With patient Time For Goal Achievement: 10/18/21 Potential to Achieve Goals: Good    Frequency Min 2X/week   Barriers to discharge Decreased caregiver support home alone, dtr works    Co-evaluation               AM-PAC PT "6 Clicks" Mobility  Outcome Measure Help needed turning from your back to your side while in a flat bed without using bedrails?: A Little Help needed moving from lying on your back to sitting on the side of a flat bed without using bedrails?: A Little Help needed moving to and from a bed to a chair (including a wheelchair)?: A Little Help needed standing up from a chair using your arms (e.g., wheelchair or bedside chair)?: A Little Help needed to walk in hospital room?: A Little Help needed climbing 3-5 steps with a railing? : A Lot 6 Click Score: 17    End of Session Equipment Utilized During Treatment: Gait belt Activity Tolerance: Patient tolerated treatment well Patient left: in chair;with call bell/phone within reach;with chair alarm set Nurse Communication: Mobility status PT Visit Diagnosis: Unsteadiness on feet (R26.81);Difficulty in walking, not elsewhere classified (R26.2)    Time: 1001-1017 PT Time Calculation (min) (ACUTE ONLY): 16 min   Charges:   PT Evaluation $PT Eval Low Complexity: [place] [facility] Acute Rehabilitation Services Pager [phone] Office [phone]   Claretha Cooper 10/04/2021, 2:43 PM

## 2021-10-04 NOTE — Progress Notes (Signed)
[facility] Progress Note  Patient: [name]    VEL:[id number]  DOA: 10/01/2021    Date of Service: the patient was seen and examined on 10/04/2021  Brief hospital course: 11/29 presents to the hospital with GI bleed with supratherapeutic INR reversed with vitamin K.  Received 2 PRBC transfusion. 11/30 underwent EGD nonbleeding esophageal ulcer.  Continues to have melena. 12/1 hemoglobin remaining stable.  Assessment and Plan: * Acute GI bleeding Presents with anemia.  Hemoglobin 6.2 on admission. EGD 11/30 shows nonbleeding esophageal ulcer.  Otherwise normal. Due to patient continues to be having melanotic stool, patient continues remain on clear liquid diet for now. Would monitor overnight as recommended by GI. If no bleeding overnight and hemoglobin remaining stable will advance diet tomorrow and hopefully can be home tomorrow. Patient is not on any aspirin prior to admission. Would recommend to hold off on anticoagulation on discharge GI recommends soft diet today, need to reassess blood thinner as well as aspirin. Continue PPI. Return to GI clinic in 1 month  GI clinic will call for pathology results.  Acute blood loss anemia, symptomatic secondary to GI bleed SP 2 PRBC transfusion. Patient had supratherapeutic INR which was reversed. Monitor CBC as the patient actually had some melena after the EGD. INR remains subtherapeutic. Transfuse for hemoglobin less than 7.   Dizziness With RVR. No focal deficit at the time of my evaluation. Dizziness resolved with RVR resolution. PT OT consulted. Blood pressure relatively soft.  Monitor.  Coagulopathy (HCC) INR was supratherapeutic with bleeding. Currently INR is subtherapeutic after receiving vitamin K.  Chronic heart failure with preserved ejection fraction (HCC) No evidence of volume overload. Blood pressure relatively soft.  Holding Lasix. Monitor.  S/P aortic valve replacement with bioprosthetic  valve Monitor.  Chronic atrial fibrillation with RVR (HCC) On Coumadin with anticoagulation. Reversed with vitamin K. INR remains subtherapeutic. Rate controlled with Coreg.  Diabetic retinopathy [facility]) Outpatient follow-up.  Diabetic neuropathy (HCC) On gabapentin.  Currently holding.  Diabetes mellitus with coincident hypertension (HCC) Hemoglobin A1c controlled. Currently on sliding scale insulin.  Monitor.  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  Continue foam dressing.  Carcinoma of prostate [facility]) Prior history.  Outpatient follow-up.    Body mass index is 24.11 kg/m.  Nutrition Problem: Increased nutrient needs Etiology: wound healing Pressure Injury 10/02/21 Coccyx Medial Stage 3 -  Full thickness tissue loss. Subcutaneous fat may be visible but bone, tendon or muscle are NOT exposed. (Active)  10/02/21 1425  Location: Coccyx  Location Orientation: Medial  Staging: Stage 3 -  Full thickness tissue loss. Subcutaneous fat may be visible but bone, tendon or muscle are NOT exposed.  Wound Description (Comments):   Present on Admission: Yes     Subjective: No nausea no vomiting.  No fever no chills.  No chest pain abdominal pain.  Reports fatigue and tiredness.  Objective: Vitals remained stable.  Exam: General: Appear in mild distress, no Rash; Oral Mucosa Clear, moist. no Abnormal Neck Mass Or lumps, Conjunctiva normal  Cardiovascular: S1 and S2 Present, no Murmur, Respiratory: good respiratory effort, Bilateral Air entry present and CTA, no Crackles, no wheezes Abdomen: Bowel Sound present, Soft and no tenderness Extremities: no Pedal edema Neurology: alert and oriented to time, place, and person affect appropriate. no new focal deficit Gait not checked due to patient safety concerns    Data Reviewed: My review of labs, imaging, notes and other tests shows no new significant findings.   Disposition:  Status is: Inpatient  Remains inpatient  appropriate because: Overnight observation to ensure that the hemoglobin remained stable while the patient is having ongoing melena.   Family Communication: None at bedside.  DVT Prophylaxis: SCDs Start: 10/02/21 0452   Time spent: 35 minutes.   Author: Berle Mull  10/04/2021 7:43 PM  To reach On-call, see care teams to locate the attending and reach out via [email or web address]. Between 7PM-7AM, please contact night-coverage If you still have difficulty reaching the attending provider, please page the [facility] (Director on Call) for [facility] on amion for assistance.

## 2021-10-04 NOTE — Progress Notes (Signed)
Patient continues to refuse insulin. MD updated on CBG 213. Patient requesting home DM medication. MD notified and made aware that patient would like to take his home medication. Agricultural consultant notified.

## 2021-10-04 NOTE — Progress Notes (Signed)
[facility] Gastroenterology Progress Note  [name] 87 y.o. Nov 05, 1932  CC:  Anemia   Subjective: Patient is tolerating diet well. Per RN report, he has had 4 dark tarry stools since yesterday. Denies abdominal pain, nausea, vomiting.   ROS : Review of Systems  Cardiovascular:  Negative for chest pain and palpitations.  Gastrointestinal:  Positive for melena. Negative for abdominal pain, blood in stool, constipation, diarrhea, heartburn, nausea and vomiting.     Objective: Vital signs in last 24 hours: Vitals:   10/03/21 2059 10/04/21 0334  BP: (!) 122/49 131/72  Pulse: 85 100  Resp: 20 18  Temp: 97.7 F (36.5 C) 97.7 F (36.5 C)  SpO2: 97% 92%    Physical Exam:  General:  Alert, cooperative, no distress, appears stated age  Head:  Normocephalic, without obvious abnormality, atraumatic  Eyes:  Anicteric sclera, EOM's intact, conjunctival pallor  Lungs:   Clear to auscultation bilaterally, respirations unlabored  Heart:  Irregularly irregular rhythm, regular rate.  Abdomen:   Soft, non-tender, bowel sounds active all four quadrants,  no masses,     Lab Results: Recent Labs    10/03/21 1244 10/04/21 0453  NA 138 136  K 4.7 4.2  CL 109 111  CO2 18* 19*  GLUCOSE 174* 220*  BUN 64* 55*  CREATININE 1.08 0.98  CALCIUM 8.8* 8.2*   Recent Labs    10/02/21 0613  AST 23  ALT 17  ALKPHOS 44  BILITOT 1.2  PROT 5.5*  ALBUMIN 2.9*   Recent Labs    10/02/21 0613 10/03/21 0514 10/03/21 1958 10/04/21 0453  WBC 7.3   < > 14.5* 11.7*  NEUTROABS 5.6  --   --   --   HGB 7.6*   < > 8.5* 8.4*  HCT 23.2*   < > 25.4* 25.8*  MCV 97.5   < > 97.7 99.2  PLT 214   < > 222 231   < > = values in this interval not displayed.   Recent Labs    10/03/21 1244 10/04/21 0453  LABPROT 14.9 15.8*  INR 1.2 1.3*      Assessment Anemia, positive FOBT - Hgb 8.4 (8.5 yesterday) - Iron 89, no ferritin obtained - BUN 55 (trending down from 64 yesterday), Cr 0.98 - INR  1.3 - EGD 11/30: tiny hiatal hernia, non-bleeding gastric ulcers with no stigmata of bleeding, biopsied. Normal duodenum.  Constipation   Afib - last dose of coumadin was 11/27   Essential hypertension   Plan: Patient is still passing dark tarry stools, EGD showed non-bleeding gastric ulcers. Hgb remains low, but stable. Would like to observe him for another night, possibly residual bleeding. However, if continuing to bleed can consider capsule endoscopy. Continue protonix 40mg  IV BID Continue daily CBC and transfuse as needed to maintain HGB > 7  [facility] will follow.  Gwenivere Hiraldo Radford Pax PA-C 10/04/2021, 8:18 AM  Contact #  [phone]

## 2021-10-05 ENCOUNTER — Encounter (HOSPITAL_COMMUNITY): Payer: Self-pay | Admitting: Gastroenterology

## 2021-10-05 DIAGNOSIS — R5381 Other malaise: Secondary | ICD-10-CM | POA: Diagnosis present

## 2021-10-05 LAB — CBC
HCT: 28.2 % — ABNORMAL LOW (ref 39.0–52.0)
Hemoglobin: 8.8 g/dL — ABNORMAL LOW (ref 13.0–17.0)
MCH: 32 pg (ref 26.0–34.0)
MCHC: 31.2 g/dL (ref 30.0–36.0)
MCV: 102.5 fL — ABNORMAL HIGH (ref 80.0–100.0)
Platelets: 196 10*3/uL (ref 150–400)
RBC: 2.75 MIL/uL — ABNORMAL LOW (ref 4.22–5.81)
RDW: 16.9 % — ABNORMAL HIGH (ref 11.5–15.5)
WBC: 9.6 10*3/uL (ref 4.0–10.5)
nRBC: 1.4 % — ABNORMAL HIGH (ref 0.0–0.2)

## 2021-10-05 LAB — COMPREHENSIVE METABOLIC PANEL
ALT: 19 U/L (ref 0–44)
AST: 25 U/L (ref 15–41)
Albumin: 2.6 g/dL — ABNORMAL LOW (ref 3.5–5.0)
Alkaline Phosphatase: 51 U/L (ref 38–126)
Anion gap: 8 (ref 5–15)
BUN: 46 mg/dL — ABNORMAL HIGH (ref 8–23)
CO2: 16 mmol/L — ABNORMAL LOW (ref 22–32)
Calcium: 8.3 mg/dL — ABNORMAL LOW (ref 8.9–10.3)
Chloride: 110 mmol/L (ref 98–111)
Creatinine, Ser: 0.98 mg/dL (ref 0.61–1.24)
GFR, Estimated: >60 mL/min (ref >60)
Glucose, Bld: 201 mg/dL — ABNORMAL HIGH (ref 70–99)
Potassium: 4.6 mmol/L (ref 3.5–5.1)
Sodium: 134 mmol/L — ABNORMAL LOW (ref 135–145)
Total Bilirubin: 1 mg/dL (ref 0.3–1.2)
Total Protein: 5 g/dL — ABNORMAL LOW (ref 6.5–8.1)

## 2021-10-05 LAB — GLUCOSE, CAPILLARY
Glucose-Capillary: 193 mg/dL — ABNORMAL HIGH (ref 70–99)
Glucose-Capillary: 271 mg/dL — ABNORMAL HIGH (ref 70–99)
Glucose-Capillary: 251 mg/dL — ABNORMAL HIGH (ref 70–99)
Glucose-Capillary: 268 mg/dL — ABNORMAL HIGH (ref 70–99)

## 2021-10-05 MED ORDER — SODIUM CHLORIDE 0.9 % IV SOLN
250.0000 mg | Freq: Once | INTRAVENOUS | Status: AC
  Administered 2021-10-05: 250 mg via INTRAVENOUS
  Filled 2021-10-05: qty 20

## 2021-10-05 NOTE — Progress Notes (Signed)
[facility] Progress Note  Patient: [name]    HYW:[id number]  DOA: 10/01/2021    Date of Service: the patient was seen and examined on 10/05/2021  Brief hospital course: 11/29 presents to the hospital with GI bleed with supratherapeutic INR reversed with vitamin K.  Received 2 PRBC transfusion. 11/30 underwent EGD nonbleeding esophageal ulcer.  Continues to have melena. 12/1 hemoglobin remaining stable.  Assessment and Plan: * Acute GI bleeding Presents with anemia.  Hemoglobin 6.2 on admission. EGD 11/30 shows nonbleeding esophageal ulcer.  Otherwise normal. Due to patient continues to be having melanotic stool, patient continues remain on clear liquid diet for now. Patient is not on any aspirin prior to admission. Would recommend to hold off on anticoagulation on discharge Continue PPI.  Soft diet for now. Return to GI clinic in 1 month  GI clinic will call for pathology results.  Acute blood loss anemia, symptomatic secondary to GI bleed SP 2 PRBC transfusion. Patient had supratherapeutic INR which was reversed. Monitor CBC as the patient actually had some melena after the EGD. INR remains subtherapeutic. Transfuse for hemoglobin less than 7.   Dizziness Associated with RVR. No focal deficit at the time of my evaluation. Dizziness resolved with RVR resolution. PT OT consulted. Blood pressure relatively soft.  Monitor.  Coagulopathy (HCC) INR was supratherapeutic with bleeding. Currently INR is subtherapeutic after receiving vitamin K.  Chronic heart failure with preserved ejection fraction (HCC) No evidence of volume overload. Blood pressure relatively soft.  Holding Lasix. Monitor.  S/P aortic valve replacement with bioprosthetic valve Monitor.  Outpatient echocardiogram planned by cardiology.  Chronic atrial fibrillation with RVR (HCC) On Coumadin with anticoagulation. Reversed with vitamin K. INR remains subtherapeutic. Rate controlled with  Coreg.  Diabetic retinopathy [facility]) Outpatient follow-up.  Diabetic neuropathy (HCC) On gabapentin.  Currently holding.  Will likely resume tomorrow.  Diabetes mellitus with coincident hypertension (HCC) Hemoglobin A1c controlled. Currently on sliding scale insulin.  Monitor.  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  Continue foam dressing.  Carcinoma of prostate [facility]) Prior history.  Outpatient follow-up.  Physical deconditioning PT OT evaluation recommends SNF placement.  Social worker working. Patient will be able to go on Monday 12/5. Will order COVID test on Sunday 12/4.    Body mass index is 24.11 kg/m.  Nutrition Problem: Increased nutrient needs Etiology: wound healing Pressure Injury 10/02/21 Coccyx Medial Stage 3 -  Full thickness tissue loss. Subcutaneous fat may be visible but bone, tendon or muscle are NOT exposed. (Active)  10/02/21 1425  Location: Coccyx  Location Orientation: Medial  Staging: Stage 3 -  Full thickness tissue loss. Subcutaneous fat may be visible but bone, tendon or muscle are NOT exposed.  Wound Description (Comments):   Present on Admission: Yes     Subjective: No nausea no vomiting.  No dizziness or lightheadedness.  No abdominal pain.  Objective: Vital signs were reviewed and unremarkable.  Exam: General: Appear in mild distress, no Rash; Oral Mucosa Clear, moist. no Abnormal Neck Mass Or lumps, Conjunctiva normal  Cardiovascular: S1 and S2 Present, no Murmur, Respiratory: good respiratory effort, Bilateral Air entry present and CTA, no Crackles, no wheezes Abdomen: Bowel Sound present, Soft and no tenderness Extremities: no Pedal edema Neurology: alert and oriented to time, place, and person affect appropriate. no new focal deficit Gait not checked due to patient safety concerns     Data Reviewed: My review of labs, imaging, notes and other tests shows no new significant findings.  Disposition:  Status is:  Inpatient  Remains inpatient appropriate because: Unsafe discharge.  Plan to go to SNF on Monday.   Family Communication: Daughter at bedside.  DVT Prophylaxis: SCDs Start: 10/02/21 0452   Time spent: 35 minutes.   Author: Berle Mull  10/05/2021 5:34 PM  To reach On-call, see care teams to locate the attending and reach out via [email or web address]. Between 7PM-7AM, please contact night-coverage If you still have difficulty reaching the attending provider, please page the [facility] (Director on Call) for [facility] on amion for assistance.

## 2021-10-05 NOTE — Assessment & Plan Note (Addendum)
PT OT evaluation recommends SNF placement.  Social worker working. Patient will be able to go on Monday 12/5.

## 2021-10-05 NOTE — Progress Notes (Signed)
Inpatient Diabetes Program Recommendations  AACE/ADA: New Consensus Statement on Inpatient Glycemic Control (2015)  Target Ranges:  Prepandial:   less than 140 mg/dL      Peak postprandial:   less than 180 mg/dL (1-2 hours)      Critically ill patients:  140 - 180 mg/dL   Lab Results  Component Value Date   GLUCAP 271 (H) 10/05/2021   HGBA1C 7.6 (H) 01/15/2013    Review of Glycemic Control  Latest Reference Range & Units 10/04/21 07:48 10/04/21 11:26 10/04/21 16:47 10/04/21 21:20 10/05/21 07:11 10/05/21 11:17  Glucose-Capillary 70 - 99 mg/dL 188 (H) 213 (H) 255 (H) 248 (H) 193 (H) 271 (H)   Diabetes history: DM 2 Outpatient Diabetes medications: Farxiga 10 mg Daily, Glipizide 5 mg bid for breakfast and lunch, Metformin 1000 mg bid Current orders for Inpatient glycemic control:  Novolog 0-9 units tid + hs (pt refusing)  Inpatient Diabetes Program Recommendations:    -  d/c insulin due to pt refusal -  consider Glipizide 5 mg Daily  Thanks,  Tama Headings RN, MSN, BC-ADM Inpatient Diabetes Coordinator Team Pager [phone] (8a-5p)

## 2021-10-05 NOTE — TOC Initial Note (Addendum)
Transition of Care [facility]) - Initial/Assessment Note    Patient Details  Name: [name] MRN: [id number] Date of Birth: Apr 05, 1933  Transition of Care [facility]) CM/SW Contact:    Trish Mage, LCSW Phone Number: 10/05/2021, 11:39 AM  Clinical Narrative:   Patient seen in follow up to PT recommendation of SNF.  Mr Naill currently lives at home alone here in [place], states his wife is in rehab at [place] as she broke some bones in her foot.  His daughter has been coming to check on him daily, but she is unable to stay with him around the clock, and he acknowledges that he is currently weak and in need of more help/support. Bed search process explained and initiated. TOC will continue to follow during the course of hospitalization.  Addendum: Received insurance auth  Reference [id number]   12/2-12/6.  [facility] does not have a bed until Monday.  COVID test on Sunday  Daughter informed                 Expected Discharge Plan: Skilled Nursing Facility Barriers to Discharge: SNF Pending bed offer   Patient Goals and CMS Choice     Choice offered to / list presented to : Patient  Expected Discharge Plan and Services Expected Discharge Plan: [place]   Discharge Planning Services: CM Consult Post Acute Care Choice: [place] Living arrangements for the past 2 months: Single Family Home                                      Prior Living Arrangements/Services Living arrangements for the past 2 months: Single Family Home Lives with:: Self Patient language and need for interpreter reviewed:: Yes        Need for Family Participation in Patient Care: Yes (Comment) Care giver support system in place?: Yes (comment)   Criminal Activity/Legal Involvement Pertinent to Current Situation/Hospitalization: No - Comment as needed  Activities of Daily Living Home Assistive Devices/Equipment: Dentures (specify type), Eyeglasses, Cane (specify quad  or straight), CBG Meter (upper/lower dentures, single point cane) ADL Screening (condition at time of admission) Patient's cognitive ability adequate to safely complete daily activities?: Yes Is the patient deaf or have difficulty hearing?: No Does the patient have difficulty seeing, even when wearing glasses/contacts?: No Does the patient have difficulty concentrating, remembering, or making decisions?: No Patient able to express need for assistance with ADLs?: Yes Does the patient have difficulty dressing or bathing?: No Independently performs ADLs?: Yes (appropriate for developmental age) (uses a cane at times) Does the patient have difficulty walking or climbing stairs?: Yes (secondary to weakness) Weakness of Legs: Both Weakness of Arms/Hands: None  Permission Sought/Granted                  Emotional Assessment Appearance:: Appears stated age Attitude/Demeanor/Rapport: Engaged Affect (typically observed): Appropriate Orientation: : Oriented to Self, Oriented to Place, Oriented to  Time, Oriented to Situation Alcohol / Substance Use: Not Applicable Psych Involvement: No (comment)  Admission diagnosis:  Coagulopathy ([place]) [D68.9] Acute GI bleeding [K92.2] Fall [W19.XXXA] Anemia, unspecified type [D64.9] Patient Active Problem List   Diagnosis Date Noted   Pressure injury of skin 10/03/2021   Dizziness 10/03/2021   Coagulopathy ([place]) 10/02/2021   Acute GI bleeding 10/01/2021   Hardening of the aorta (main artery of the heart) ([place]) 09/24/2021   Hypertension 09/24/2021   Neuropathy 06/15/2021  Recurrent falls 06/15/2021   Diabetic neuropathy ([place]) 03/09/2021   Diabetic retinopathy ([place]) 03/09/2021   Acute blood loss anemia, symptomatic secondary to GI bleed 03/09/2021   Other thrombophilia ([place]) 03/09/2021   Prostate cancer ([place]) 03/09/2021   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   Nonrheumatic mitral valve  regurgitation 09/11/2020   Nonrheumatic tricuspid valve regurgitation 09/11/2020   PAD (peripheral artery disease) ([place]) 08/22/2017   Absolute anemia 04/02/2016   Diabetes mellitus with coincident hypertension ([place]) 04/02/2016   Long term current use of anticoagulant 04/02/2016   Macrocytosis 04/02/2016   Arthritis, degenerative 04/02/2016   History of colon polyps 04/02/2016   Carcinoma of prostate ([place]) 04/02/2016   Abdominal discomfort, epigastric 12/20/2015   H/O adenomatous polyp of colon 12/20/2015   Left upper quadrant pain 03/16/2015   Chronic heart failure with preserved ejection fraction ([place]) 08/30/2014   S/P aortic valve replacement with bioprosthetic valve 01/19/2013   Diabetes mellitus ([place]) 12/21/2012   Hyperlipemia 12/21/2012   H/O prostate cancer 12/21/2012   Anticoagulated on Coumadin 12/21/2012   HYPERTENSION, CONTROLLED 09/06/2009   Chronic atrial fibrillation with RVR ([place]) 09/06/2009   PCP:  [name], MD Pharmacy:   [place], [place] - [address] [place] [place] [address] Phone: [phone] Fax: [phone]     Social Determinants of Health (SDOH) Interventions    Readmission Risk Interventions No flowsheet data found.

## 2021-10-05 NOTE — Progress Notes (Signed)
   10/05/21 1100  Oxygen Therapy  SpO2 95 %  O2 Device Room Air  Patient Activity (if Appropriate) Ambulating  Mobility  Activity Ambulated in hall  Level of Assistance Minimal assist, patient does 75% or more  Assistive Device Front wheel walker  Distance Ambulated (ft) 30 ft  Mobility Ambulated with assistance in hallway  Mobility Response Tolerated fair  Mobility performed by Mobility specialist  $Mobility charge 1 Mobility   Pt agreeable to mobilize this morning. NT and RN present in room upon arrival. Got pt to [facility] prior to session. Pt required min A to stand from EOB, and stated his left shoulder was bothering him. Once completed, pt ambulated in the hall about 81ft with RW, tolerated fair. Pt stated he "wore himself out". Called for NT to bring pt's recliner to roll pt back to his room. Pt stated that his shoulder was again bothering him, and pain was at 7/10. Left pt in chair with call bell at side, alarm on, and NT present. NT stated that pt was feeling SOB prior to beginning session.   [place] Specialist Acute Rehab Services Office: [phone]

## 2021-10-05 NOTE — Consult Note (Signed)
[facility] Inpatient Consult   10/05/2021  MICHAELANGELO MITTELMAN [birth year] [id number]  [place] Management [facility] CM)   Patient chart reviewed for potential [facility] CM chronic disease and care coordination services with noted high risk score for unplanned readmission. Patient has one inpatient admission within past 6 months.   Plan: Will continue to follow for progression and disposition plans.  Of note, [facility] Care Management services does not replace or interfere with any services that are arranged by inpatient case management or social work.   Netta Cedars, MSN, RN [facility] [employer] [phone]  Toll free office [phone]

## 2021-10-05 NOTE — NC FL2 (Signed)
[place] LEVEL OF CARE SCREENING TOOL     IDENTIFICATION  Patient Name: [name] Birthdate: [birth year] Sex: male Admission Date (Current Location): 10/01/2021  [facility] and [place] Number:  Herbalist and Address:  [facility],  [address] [place], [place]      Provider Number: [id number]  Attending Physician Name and Address:  Lavina Hamman, MD  Relative Name and Phone Number:  Patsi Sears (Daughter)   [phone]    Current Level of Care: Hospital Recommended Level of Care: [place] Prior Approval Number:    Date Approved/Denied:   PASRR Number: [id number]  Discharge Plan: SNF    Current Diagnoses: Patient Active Problem List   Diagnosis Date Noted   Pressure injury of skin 10/03/2021   Dizziness 10/03/2021   Coagulopathy ([place]) 10/02/2021   Acute GI bleeding 10/01/2021   Hardening of the aorta (main artery of the heart) ([place]) 09/24/2021   Hypertension 09/24/2021   Neuropathy 06/15/2021   Recurrent falls 06/15/2021   Diabetic neuropathy ([place]) 03/09/2021   Diabetic retinopathy ([place]) 03/09/2021   Acute blood loss anemia, symptomatic secondary to GI bleed 03/09/2021   Other thrombophilia ([place]) 03/09/2021   Prostate cancer ([place]) 03/09/2021   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   Nonrheumatic mitral valve regurgitation 09/11/2020   Nonrheumatic tricuspid valve regurgitation 09/11/2020   PAD (peripheral artery disease) ([place]) 08/22/2017   Absolute anemia 04/02/2016   Diabetes mellitus with coincident hypertension ([place]) 04/02/2016   Long term current use of anticoagulant 04/02/2016   Macrocytosis 04/02/2016   Arthritis, degenerative 04/02/2016   History of colon polyps 04/02/2016   Carcinoma of prostate ([place]) 04/02/2016   Abdominal discomfort, epigastric 12/20/2015   H/O adenomatous polyp of colon 12/20/2015   Left upper quadrant pain  03/16/2015   Chronic heart failure with preserved ejection fraction ([place]) 08/30/2014   S/P aortic valve replacement with bioprosthetic valve 01/19/2013   Diabetes mellitus ([place]) 12/21/2012   Hyperlipemia 12/21/2012   H/O prostate cancer 12/21/2012   Anticoagulated on Coumadin 12/21/2012   HYPERTENSION, CONTROLLED 09/06/2009   Chronic atrial fibrillation with RVR ([place]) 09/06/2009    Orientation RESPIRATION BLADDER Height & Weight     Self, Time, Situation, Place  Normal External catheter Weight: 73 kg Height:  5' 8.5" (174 cm)  BEHAVIORAL SYMPTOMS/MOOD NEUROLOGICAL BOWEL NUTRITION STATUS      Continent Diet (see d/c summary)  AMBULATORY STATUS COMMUNICATION OF NEEDS Skin   Extensive Assist Verbally Other (Comment) (Pressure injury, coccyx; Wound, Venous Stasis Ulcer, Pre-tibial bi-lateral)                       Personal Care Assistance Level of Assistance  Bathing, Feeding, Dressing Bathing Assistance: Maximum assistance Feeding assistance: Independent Dressing Assistance: Limited assistance     Functional Limitations Info  Sight, Hearing, Speech Sight Info: Adequate Hearing Info: Adequate Speech Info: Adequate    SPECIAL CARE FACTORS FREQUENCY  PT (By licensed PT), OT (By licensed OT)     PT Frequency: 5X/W OT Frequency: 5X/W            Contractures Contractures Info: Not present    Additional Factors Info  Code Status, Allergies Code Status Info: full Allergies Info: Amoxicillin           Current Medications (10/05/2021):  This is the current hospital active medication list Current Facility-Administered Medications  Medication Dose Route Frequency Provider Last Rate Last  Admin   acetaminophen (TYLENOL) tablet 650 mg  650 mg Oral Q6H PRN [name], MD   650 mg at 10/02/21 2122   Or   acetaminophen (TYLENOL) suppository 650 mg  650 mg Rectal Q6H PRN [name], MD       carvedilol (COREG) tablet 3.125 mg  3.125 mg Oral BID Lavina Hamman, MD    3.125 mg at 10/05/21 1033   feeding supplement (BOOST / RESOURCE BREEZE) liquid 1 Container  1 Container Oral TID BM Lavina Hamman, MD   1 Container at 10/04/21 0901   ferric gluconate (FERRLECIT) 250 mg in sodium chloride 0.9 % 250 mL IVPB  250 mg Intravenous Once Lavina Hamman, MD       insulin aspart (novoLOG) injection 0-5 Units  0-5 Units Subcutaneous QHS Lavina Hamman, MD       insulin aspart (novoLOG) injection 0-9 Units  0-9 Units Subcutaneous TID WC Lavina Hamman, MD       metoprolol tartrate (LOPRESSOR) injection 5 mg  5 mg Intravenous Q6H PRN [name], MD   5 mg at 10/03/21 0759   multivitamin with minerals tablet 1 tablet  1 tablet Oral Daily Lavina Hamman, MD   1 tablet at 10/05/21 1033   nutrition supplement (JUVEN) (JUVEN) powder packet 1 packet  1 packet Oral BID BM Lavina Hamman, MD   1 packet at 10/04/21 1427   pantoprazole (PROTONIX) injection 40 mg  40 mg Intravenous Q12H [name], MD   40 mg at 10/05/21 1034   polyethylene glycol (MIRALAX / GLYCOLAX) packet 17 g  17 g Oral Q12H PRN [name], MD         Discharge Medications: Please see discharge summary for a list of discharge medications.  Relevant Imaging Results:  Relevant Lab Results:   Additional Information [place], [place]

## 2021-10-05 NOTE — Care Management Important Message (Signed)
Important Message  Patient Details IM Letter given to the Patient. Name: JAXTYN LINVILLE MRN: [id number] Date of Birth: [birth year]   Medicare Important Message Given:  Yes     Kerin Salen 10/05/2021, 11:20 AM

## 2021-10-05 NOTE — Progress Notes (Addendum)
[facility] Gastroenterology Progress Note  [name] 87 y.o. 12-14-1932  CC:  Upper  GI bleed   Subjective: Patient states he is hungry and ready to try a soft diet. Denies abdominal pain, nausea, vomiting. Per RN, he has had stools that are intermittent melena with regular color which is improvement compared to all melenic stools yesterday.   ROS : Review of Systems  Gastrointestinal:  Positive for melena. Negative for abdominal pain, blood in stool, constipation, diarrhea, heartburn, nausea and vomiting.  Genitourinary:  Negative for dysuria and urgency.     Objective: Vital signs in last 24 hours: Vitals:   10/04/21 2045 10/05/21 0551  BP: (!) 111/51 (!) 132/54  Pulse: 78 73  Resp: 18 18  Temp: 97.6 F (36.4 C) (!) 97.5 F (36.4 C)  SpO2: 96% 95%    Physical Exam:  General:  Alert, cooperative, no distress, appears stated age  Head:  Normocephalic, without obvious abnormality, atraumatic  Eyes:  Anicteric sclera, EOM's intact, conjunctival pallor  Lungs:   Clear to auscultation bilaterally, respirations unlabored  Heart:  Regular rate and rhythm, S1, S2 normal  Abdomen:   Soft, non-tender, bowel sounds active all four quadrants,  no masses,     Lab Results: Recent Labs    10/03/21 1244 10/04/21 0453  NA 138 136  K 4.7 4.2  CL 109 111  CO2 18* 19*  GLUCOSE 174* 220*  BUN 64* 55*  CREATININE 1.08 0.98  CALCIUM 8.8* 8.2*   No results for input(s): AST, ALT, ALKPHOS, BILITOT, PROT, ALBUMIN in the last 72 hours. Recent Labs    10/04/21 0453 10/04/21 1247  WBC 11.7* 11.9*  HGB 8.4* 8.2*  HCT 25.8* 26.6*  MCV 99.2 102.7*  PLT 231 249   Recent Labs    10/03/21 1244 10/04/21 0453  LABPROT 14.9 15.8*  INR 1.2 1.3*      Assessment Upper GI bleed, anemia - Hgb 8.4 (8.5 yesterday). Updated hgb pending. - Iron 25 - BUN  (trending down, Cr 0.98 - INR 1.3 - EGD 11/30: tiny hiatal hernia, non-bleeding gastric ulcers with no stigmata of bleeding,  biopsied. Normal duodenum. Biopsies positive for H. Pylori.   Constipation   Afib - last dose of coumadin was 11/27   Essential hypertension  Plan: Continue soft diet as tolerated. Hgb and BUN stable. Biopsies were positive for H. Pylori. Recommend outpatient treatment. [facility] will sign off. Please contact us if we can be of any further assistance during this hospital stay.   Garnette Scheuermann PA-C 10/05/2021, 8:41 AM  Contact #  [phone]

## 2021-10-06 LAB — GLUCOSE, CAPILLARY
Glucose-Capillary: 212 mg/dL — ABNORMAL HIGH (ref 70–99)
Glucose-Capillary: 327 mg/dL — ABNORMAL HIGH (ref 70–99)
Glucose-Capillary: 221 mg/dL — ABNORMAL HIGH (ref 70–99)
Glucose-Capillary: 319 mg/dL — ABNORMAL HIGH (ref 70–99)

## 2021-10-06 LAB — CBC
HCT: 25.4 % — ABNORMAL LOW (ref 39.0–52.0)
Hemoglobin: 7.9 g/dL — ABNORMAL LOW (ref 13.0–17.0)
MCH: 31.7 pg (ref 26.0–34.0)
MCHC: 31.1 g/dL (ref 30.0–36.0)
MCV: 102 fL — ABNORMAL HIGH (ref 80.0–100.0)
Platelets: 220 10*3/uL (ref 150–400)
RBC: 2.49 MIL/uL — ABNORMAL LOW (ref 4.22–5.81)
RDW: 17.3 % — ABNORMAL HIGH (ref 11.5–15.5)
WBC: 7.5 10*3/uL (ref 4.0–10.5)
nRBC: 1.6 % — ABNORMAL HIGH (ref 0.0–0.2)

## 2021-10-06 LAB — BASIC METABOLIC PANEL
Anion gap: 7 (ref 5–15)
BUN: 38 mg/dL — ABNORMAL HIGH (ref 8–23)
CO2: 19 mmol/L — ABNORMAL LOW (ref 22–32)
Calcium: 8 mg/dL — ABNORMAL LOW (ref 8.9–10.3)
Chloride: 106 mmol/L (ref 98–111)
Creatinine, Ser: 1.07 mg/dL (ref 0.61–1.24)
GFR, Estimated: >60 mL/min (ref >60)
Glucose, Bld: 324 mg/dL — ABNORMAL HIGH (ref 70–99)
Potassium: 3.4 mmol/L — ABNORMAL LOW (ref 3.5–5.1)
Sodium: 132 mmol/L — ABNORMAL LOW (ref 135–145)

## 2021-10-06 LAB — MAGNESIUM: Magnesium: 2.2 mg/dL (ref 1.7–2.4)

## 2021-10-06 MED ORDER — GABAPENTIN 100 MG PO CAPS
100.0000 mg | ORAL_CAPSULE | Freq: Every day | ORAL | Status: DC
  Filled 2021-10-06: qty 1

## 2021-10-06 MED ORDER — SACCHAROMYCES BOULARDII 250 MG PO CAPS
250.0000 mg | ORAL_CAPSULE | Freq: Two times a day (BID) | ORAL | Status: DC
  Administered 2021-10-06: 250 mg via ORAL
  Filled 2021-10-06 (×2): qty 1

## 2021-10-06 MED ORDER — SODIUM CHLORIDE 0.9 % IV SOLN
250.0000 mg | Freq: Once | INTRAVENOUS | Status: AC
  Administered 2021-10-06: 250 mg via INTRAVENOUS
  Filled 2021-10-06: qty 20

## 2021-10-06 MED ORDER — ROSUVASTATIN CALCIUM 5 MG PO TABS
5.0000 mg | ORAL_TABLET | Freq: Every evening | ORAL | Status: DC
  Administered 2021-10-06: 5 mg via ORAL
  Filled 2021-10-06: qty 1

## 2021-10-06 MED ORDER — POTASSIUM CHLORIDE CRYS ER 20 MEQ PO TBCR
40.0000 meq | EXTENDED_RELEASE_TABLET | Freq: Once | ORAL | Status: AC
  Administered 2021-10-06: 40 meq via ORAL
  Filled 2021-10-06: qty 2

## 2021-10-06 MED ORDER — DAPAGLIFLOZIN PROPANEDIOL 10 MG PO TABS
10.0000 mg | ORAL_TABLET | Freq: Every day | ORAL | Status: DC
  Administered 2021-10-08: 10 mg via ORAL
  Filled 2021-10-06 (×2): qty 1

## 2021-10-06 MED ORDER — ENZALUTAMIDE 40 MG PO CAPS: 160.0000 mg | ORAL_CAPSULE | Freq: Every day | ORAL | Status: DC

## 2021-10-06 MED ORDER — FERROUS SULFATE 325 (65 FE) MG PO TABS
325.0000 mg | ORAL_TABLET | Freq: Two times a day (BID) | ORAL | Status: DC
  Administered 2021-10-06 – 2021-10-08 (×3): 325 mg via ORAL
  Filled 2021-10-06 (×3): qty 1

## 2021-10-06 MED ORDER — PANTOPRAZOLE SODIUM 40 MG PO TBEC
40.0000 mg | DELAYED_RELEASE_TABLET | Freq: Two times a day (BID) | ORAL | Status: DC
  Administered 2021-10-06 – 2021-10-08 (×4): 40 mg via ORAL
  Filled 2021-10-06 (×4): qty 1

## 2021-10-06 NOTE — Progress Notes (Signed)
Mobility Specialist - Progress Note    10/06/21 1619  Mobility  Activity Ambulated in room;Transferred to/from BSC  Level of Assistance Contact guard assist, steadying assist  Assistive Device Front wheel walker  Distance Ambulated (ft) 20 ft  Mobility Ambulated with assistance in room  Mobility Response Tolerated well  Mobility performed by Mobility specialist  $Mobility charge 1 Mobility   Upon entry pt was agreeable to mobilize and required hand held assist to sit EOB. Pt experienced SOB once up and practice pursed breathing. Pt requested to use BSC once up. With some encouragement, pt ambulated ~20 ft in room using RW prior to sitting in recliner. Pt left in recliner with chair alarm on and call bell at side. RN and NT informed of session. Vitals recorded below.   Pre-mobility: 106 HR During mobility: 115 HR, 95% SpO2 Post-mobility: 106 HR, 96% SPO2  Ralene Muskrat Mobility Specialist Acute Rehabilitation Services Phone: [phone] 10/06/21, 4:22 PM

## 2021-10-06 NOTE — Progress Notes (Signed)
PHARMACIST - PHYSICIAN ORDER COMMUNICATION  CONCERNING: P&T Medication Policy on Oral Chemo Medications  DESCRIPTION:  This patient's order for Enzalutamide Gillermina Phy) has been noted.  Enzalutamide Gillermina Phy) Hold Criteria: Seizures Hgb < 8 WBC < 2000 ANC < 1000 Pltc < 50K (Screen for drug interactions)  Today, 10/06/2021: Hgb 7.9 ACTION TAKEN: Enzalutamide order was d/c Please reassess and resume when appropriate.    Gretta Arab PharmD, BCPS Clinical Pharmacist [facility] main pharmacy [phone] 10/06/2021 4:27 PM

## 2021-10-06 NOTE — Progress Notes (Signed)
[facility] Progress Note  Patient: [name]    IZT:[id number]  DOA: 10/01/2021    Date of Service: the patient was seen and examined on 10/06/2021  Brief hospital course: 11/29 presents to the hospital with GI bleed with supratherapeutic INR reversed with vitamin K.  Received 2 PRBC transfusion. 11/30 underwent EGD nonbleeding esophageal ulcer.  Continues to have melena. 12/1 hemoglobin remaining stable.   12/3 medically stable.  Now awaiting placement on Monday.  Assessment and Plan: * Acute GI bleeding Presents with anemia.  Hemoglobin 6.2 on admission. EGD 11/30 shows nonbleeding esophageal ulcer. Patient is not on any aspirin prior to admission. GI Would recommend to hold off on anticoagulation on discharge Continue PPI.  Soft diet for now. Return to GI clinic in 1 month  GI clinic will call for pathology results.  Acute blood loss anemia, symptomatic secondary to GI bleed Hemoglobin now stable. SP 2 PRBC transfusion. Transfuse for hemoglobin less than 7.   Dizziness Associated with RVR. No focal deficit at the time of my evaluation. Dizziness resolved with RVR resolution. PT OT consulted. Blood pressure relatively soft.  Monitor.  Coagulopathy (HCC) INR was supratherapeutic with bleeding. Currently INR is subtherapeutic after receiving vitamin K.  Chronic heart failure with preserved ejection fraction (HCC) No evidence of volume overload. Blood pressure relatively soft.  Holding Lasix. Monitor.  S/P aortic valve replacement with bioprosthetic valve Monitor.  Outpatient echocardiogram planned by cardiology.  Chronic atrial fibrillation with RVR (HCC) Patient was on Coumadin and Coreg. Supratherapeutic INR reversed with vitamin K. Rate controlled with Coreg. Not a good candidate for resumption of anticoagulation.  Diabetic retinopathy [facility]) Outpatient follow-up.  Diabetic neuropathy (HCC) On gabapentin.  Resume  Diabetes mellitus with  coincident hypertension ([place]) Hemoglobin A1c controlled. Currently on sliding scale insulin.  Monitor.  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  Continue foam dressing.  Carcinoma of prostate [facility]) Prior history.  Outpatient follow-up. Xtandi on hold due to anemia.  Physical deconditioning PT OT evaluation recommends SNF placement.  Social worker working. Patient will be able to go on Monday 12/5. Will order COVID test on Sunday 12/4.    Body mass index is 24.11 kg/m.  Nutrition Problem: Increased nutrient needs Etiology: wound healing Pressure Injury 10/02/21 Coccyx Medial Stage 3 -  Full thickness tissue loss. Subcutaneous fat may be visible but bone, tendon or muscle are NOT exposed. (Active)  10/02/21 1425  Location: Coccyx  Location Orientation: Medial  Staging: Stage 3 -  Full thickness tissue loss. Subcutaneous fat may be visible but bone, tendon or muscle are NOT exposed.  Wound Description (Comments):   Present on Admission: Yes     Subjective: Reports that he had 3 episodes of loose bowel movement this morning.  Discussed with nurse and the patient did not have any bowel movement so far today.  No nausea no vomiting no abdominal pain.   Objective: Vital signs were reviewed and unremarkable.  Exam: General: Appear in mild distress, no Rash; Oral Mucosa Clear, moist. no Abnormal Neck Mass Or lumps, Conjunctiva normal  Cardiovascular: S1 and S2 Present, no Murmur, Respiratory: good respiratory effort, Bilateral Air entry present and CTA, no Crackles, no wheezes Abdomen: Bowel Sound present, Soft and no tenderness Extremities: no Pedal edema Neurology: alert and oriented to time, place, and person affect appropriate. no new focal deficit Gait not checked due to patient safety concerns     Data Reviewed: My review of labs, imaging, notes and other tests  shows no new significant findings.   Disposition:  Status is: Inpatient  Remains inpatient  appropriate because: Unsafe discharge.  Reports of diarrhea although unverified.  Hemoglobin trending down.   Family Communication: None at bedside.  DVT Prophylaxis: SCDs Start: 10/02/21 0452   Time spent: 35 minutes.   Author: Berle Mull  10/06/2021 4:56 PM  To reach On-call, see care teams to locate the attending and reach out via [email or web address]. Between 7PM-7AM, please contact night-coverage If you still have difficulty reaching the attending provider, please page the [facility] (Director on Call) for [facility] on amion for assistance.

## 2021-10-07 DIAGNOSIS — R140 Abdominal distension (gaseous): Secondary | ICD-10-CM | POA: Diagnosis present

## 2021-10-07 LAB — GLUCOSE, CAPILLARY
Glucose-Capillary: 269 mg/dL — ABNORMAL HIGH (ref 70–99)
Glucose-Capillary: 318 mg/dL — ABNORMAL HIGH (ref 70–99)
Glucose-Capillary: 224 mg/dL — ABNORMAL HIGH (ref 70–99)
Glucose-Capillary: 209 mg/dL — ABNORMAL HIGH (ref 70–99)

## 2021-10-07 MED ORDER — SIMETHICONE 80 MG PO CHEW
80.0000 mg | CHEWABLE_TABLET | Freq: Four times a day (QID) | ORAL | Status: DC
  Administered 2021-10-07 – 2021-10-08 (×5): 80 mg via ORAL
  Filled 2021-10-07 (×5): qty 1

## 2021-10-07 MED ORDER — METOPROLOL SUCCINATE ER 50 MG PO TB24
50.0000 mg | ORAL_TABLET | Freq: Every day | ORAL | Status: DC
  Administered 2021-10-07 – 2021-10-08 (×2): 50 mg via ORAL
  Filled 2021-10-07 (×2): qty 1

## 2021-10-07 NOTE — Progress Notes (Deleted)
Cardiology Office Note:    Date:  10/07/2021   ID:  [name], DOB [birth year], MRN [id number]  PCP:  [name], MD  [facility] Cardiologist:  Rudean Haskell MD [place] Electrophysiologist:  Dr. Lovena Le  CC:  Follow up MR/TR  History of Present Illness:    [name] is a 87 y.o. male with a hx of Coronary Artery Calcification, severe aortic stenosis s/p transapical TAVR [name] 28, Dr. Burt Knack, 2014), HFpEF, Moderate Mitral Regurgitation, Moderate Tricuspid Regurgitation, DM with HTN, persistent atrial fibrillation seen by Dr. Lovena Le, PAD seen by Dr. [name] who presented for evaluation 09/11/20.  Found to have elevated BNP and was started on Farxiga and lasix 40 mg PO Daily- developed AKI.  Has had lasix does reduced and farixga held with improvement of kidney function but with leg swelling.  12/20 returned his diuretics.  Saw Dr. [name] and had normal ABIs. No changes made at last visit (has echo coming up).  Seen By Dr. Lovena Le and norvac-> Verapamil.  Seen 10/08/21.  Patient notes that he is doing ***.   Since day prior/last visit notes *** . Had recent fall, low Hgb, had EGD he is still in [facility].  No chest pain or pressure ***.  No SOB/DOE*** and no PND/Orthopnea***.  No weight gain or leg swelling***.  No palpitations or syncope ***.  Ambulatory blood pressure ***.    Past Medical History:  Diagnosis Date   Anemia    LOW PLATELETS OTHER DAY  PER PT   Anticoagulated on Coumadin 12/21/2012   Aortic stenosis    Arthritis    "left wrist; back sometimes" (01/21/2013)   Atrial fibrillation ([place])    [name], DR COOPER   Bradycardia    Exertional shortness of breath    Heart murmur    "I've had it for years; runs in the family on daddy's side" (01/21/2013)   HTN (hypertension)    Hyperlipemia 12/21/2012   Prostate cancer ([place])    "had 40 tx of radiation in 2009" (01/21/2013)   S/P aortic valve replacement with bioprosthetic valve 01/19/2013    Transcatheter Aortic Valve Replacement using 50mm Sapien bioprosthetic tissue valve via transapical approach   Type II diabetes mellitus ([place])     Past Surgical History:  Procedure Laterality Date   BALLOON DILATION N/A 08/02/2020   Procedure: BALLOON DILATION;  Surgeon: [name], MD;  Location: [facility];  Service: Gastroenterology;  Laterality: N/A;   BIOPSY  10/03/2021   Procedure: BIOPSY;  Surgeon: [name], MD;  Location: [facility];  Service: Endoscopy;;   CARDIAC CATHETERIZATION  12/16/2012   CARDIAC VALVE REPLACEMENT  01/19/2013   AVR   CATARACT EXTRACTION W/ INTRAOCULAR LENS  IMPLANT, BILATERAL     ESOPHAGOGASTRODUODENOSCOPY N/A 10/03/2021   Procedure: ESOPHAGOGASTRODUODENOSCOPY (EGD);  Surgeon: [name], MD;  Location: [facility];  Service: Endoscopy;  Laterality: N/A;   ESOPHAGOGASTRODUODENOSCOPY (EGD) WITH PROPOFOL N/A 08/02/2020   Procedure: ESOPHAGOGASTRODUODENOSCOPY (EGD) WITH PROPOFOL;  Surgeon: [name], MD;  Location[facility];  Service: Gastroenterology;  Laterality: N/A;   EYE SURGERY     INTRAOPERATIVE TRANSESOPHAGEAL ECHOCARDIOGRAM N/A 01/19/2013   Procedure: INTRAOPERATIVE TRANSESOPHAGEAL ECHOCARDIOGRAM;  Surgeon: [name], MD;  Location: [facility];  Service: Open Heart Surgery;  Laterality: N/A;   ORIF FOREARM FRACTURE Left 1954   "compound fx" (01/21/2013)   TRANSTHORACIC ECHOCARDIOGRAM  09/04/10, 09/07/08    Current Medications: No outpatient medications have been marked as taking for the 10/08/21 encounter (Appointment) with Gasper Sells,  Violeta Lecount A, MD.     Allergies:   Amoxicillin   Social History   Socioeconomic History   Marital status: Married    Spouse name: Not on file   Number of children: Not on file   Years of education: Not on file   Highest education level: Not on file  Occupational History   Not on file  Tobacco Use   Smoking status: Former    Packs/day: 2.00    Years: 22.00    Pack years: 44.00    Types:  Cigarettes    Quit date: 09/11/1976    Years since quitting: 45.1   Smokeless tobacco: Never  Vaping Use   Vaping Use: Never used  Substance and Sexual Activity   Alcohol use: No   Drug use: No   Sexual activity: Never  Other Topics Concern   Not on file  Social History Narrative   Not on file   Social Determinants of Health   Financial Resource Strain: Not on file  Food Insecurity: Not on file  Transportation Needs: Not on file  Physical Activity: Not on file  Stress: Not on file  Social Connections: Not on file    Family History: The patient's family history includes Bone cancer (age of onset: 49) in his mother; Cancer (age of onset: 85) in his brother; Diabetes in his mother; Heart disease in his father; Melanoma (age of onset: 71) in his mother; Pancreatic cancer (age of onset: 50) in his father; Stroke in his sister.  ROS:   Please see the history of present illness.    All other systems reviewed and are negative.  EKGs/Labs/Other Studies Reviewed:    The following studies were reviewed today:  EKG:   09/11/20 Atrial fibrillation Rate 63  Transthoracic Echocardiogram: Date:08/18/20: Results: Moderate eccentric mitral regurgitation without prolapse and Moderate tricuspid regurgitation, mild RV dysfunction, TAVR gradients OK 1. Left ventricular ejection fraction, by estimation, is 55 to 60%. The  left ventricle has normal function. The left ventricle has no regional  wall motion abnormalities. There is moderate left ventricular hypertrophy.  Left ventricular diastolic  parameters are indeterminate.   2. Right ventricular systolic function is mildly reduced. The right  ventricular size is normal. There is severely elevated pulmonary artery  systolic pressure. The estimated right ventricular systolic pressure is  36.6 mmHg.   3. Left atrial size was moderately dilated.   4. Right atrial size was moderately dilated.   5. The mitral valve is abnormal, there is  restriction of the posterior  leaflet. Moderate mitral valve regurgitation with PISA ERO 0.25 cm^2. No  evidence of mitral stenosis. Moderate mitral annular calcification.   6. Tricuspid valve regurgitation is moderate.   7. S/p TAVR with 26 mm Edwards Sapien bioprosthesis. Mean gradient 8  mmHg, no evidence for significant stenosis. No significant peri-valvular  leakage noted.   8. The inferior vena cava is dilated in size with >50% respiratory  variability, suggesting right atrial pressure of 8 mmHg.   9. The patient was in atrial fibrillation.   Recent Labs: 11/22/2020: NT-Pro BNP 3,129 10/05/2021: ALT 19 10/06/2021: BUN 38; Creatinine, Ser 1.07; Hemoglobin 7.9; Magnesium 2.2; Platelets 220; Potassium 3.4; Sodium 132  Recent Lipid Panel    Component Value Date/Time   CHOL 81 (L) 08/17/2018 0748   TRIG 82 08/17/2018 0748   HDL 28 (L) 08/17/2018 0748   CHOLHDL 2.9 08/17/2018 0748   LDLCALC 37 08/17/2018 0748    Risk Assessment/Calculations:  CHA2DS2-VASc Score =    This indicates a  % annual risk of stroke. The patient's score is based upon:     {This patient has a significant risk of stroke if diagnosed with atrial fibrillation.  Please consider VKA or DOAC agent for anticoagulation if the bleeding risk is acceptable.   You can also use the SmartPhrase .[place] for documentation.   :[id number]}   Physical Exam:    VS:  There were no vitals taken for this visit.    Wt Readings from Last 3 Encounters:  10/03/21 160 lb 15 oz (73 kg)  09/21/21 161 lb (73 kg)  07/27/21 166 lb 6.4 oz (75.5 kg)    Gen: *** distress, *** obese/well nourished/malnourished   Neck: No JVD, *** carotid bruit Ears: *** Frank Sign Cardiac: No Rubs or Gallops, *** Murmur, ***cardia, *** radial pulses Respiratory: Clear to auscultation bilaterally, *** effort, ***  respiratory rate GI: Soft, nontender, non-distended *** MS: No *** edema; *** moves all extremities Integument: Skin feels  *** Neuro:  At time of evaluation, alert and oriented to person/place/time/situation *** Psych: Normal affect, patient feels ***   ASSESSMENT:    No diagnosis found.  PLAN:    Severe AS s/p TAVR HFpEF Moderate Mitral Regurgitation Moderate Tricuspid Regurgitation HTN with DM Long standing, persistent atrial fibrillation on coumadin seen by Dr. Lovena Le (recent switch to verapamil) Peripheral Arterial Disease seen by Dr. [name] HLD with LDL < 70 - CHADSVASC 4 - NYHA class I, Stage B,  etiology likely atrial functional MR - Diuretic regimen: Lasix 40 mg PO daily *** - Coreg at 12.5 mg PO BID  - Lisinopril 20 mg Po daily - SGLT2i at 10 mg PO daily   *** Echo - Has Clindamycin 600 mg PO PRN dental procedure (1 hr prior) - Continue compression stockings - AC per EP and Phamacy   Will plan for *** follow up unless new symptoms or abnormal test results warranting change in plan  Would be reasonable for *** APP Follow up    Medication Adjustments/Labs and Tests Ordered: Current medicines are reviewed at length with the patient today.  Concerns regarding medicines are outlined above.  No orders of the defined types were placed in this encounter.  No orders of the defined types were placed in this encounter.   There are no Patient Instructions on file for this visit.   Signed, [name], MD  10/07/2021 1:39 PM    [facility]

## 2021-10-07 NOTE — Assessment & Plan Note (Signed)
Refusing to take any medication as he thinks that the medication is contributing to his bloating. Will provide simethicone.

## 2021-10-07 NOTE — Progress Notes (Signed)
[facility] Progress Note  Patient: [name]    WVP:[id number]  DOA: 10/01/2021    Date of Service: the patient was seen and examined on 10/07/2021  Brief hospital course: 11/29 presents to the hospital with GI bleed with supratherapeutic INR reversed with vitamin K.  Received 2 PRBC transfusion. 11/30 underwent EGD nonbleeding esophageal ulcer.  Continues to have melena. 12/1 hemoglobin remaining stable.   12/3 medically stable.  Now awaiting placement on Monday.  Assessment and Plan: * Acute GI bleeding Presents with anemia.  Hemoglobin 6.2 on admission. EGD 11/30 shows nonbleeding esophageal ulcer. Patient is not on any aspirin prior to admission. GI Would recommend to hold off on anticoagulation on discharge Continue PPI.  Change to carb modified diet. Return to GI clinic in 1 month  GI clinic will call for pathology results.  Acute blood loss anemia, symptomatic secondary to GI bleed Hemoglobin now stable. SP 2 PRBC transfusion. Transfuse for hemoglobin less than 7.   Abdominal bloating Refusing to take any medication as he thinks that the medication is contributing to his bloating. Will provide simethicone.  Dizziness Associated with RVR. No focal deficit at the time of my evaluation. Dizziness resolved with RVR resolution. PT OT consulted. Blood pressure relatively soft.  Monitor.  Coagulopathy (HCC) INR was supratherapeutic with bleeding. Currently INR is subtherapeutic after receiving vitamin K.  Chronic heart failure with preserved ejection fraction (HCC) No evidence of volume overload. Blood pressure relatively soft.  Holding Lasix. Monitor.  S/P aortic valve replacement with bioprosthetic valve Monitor.  Outpatient echocardiogram planned by cardiology.  Chronic atrial fibrillation with RVR (HCC) Patient was on Coumadin and Coreg. Supratherapeutic INR reversed with vitamin K. Rate controlled with Coreg.  Switch to Toprol-XL for ease of  administration. Not a good candidate for resumption of anticoagulation.  Daughter currently agrees.  Diabetic retinopathy [facility]) Outpatient follow-up.  Diabetic neuropathy (HCC) On gabapentin.  Resume  Diabetes mellitus with coincident hypertension ([place]) Hemoglobin A1c controlled. Currently on sliding scale insulin.  Monitor.  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  Continue foam dressing.  Carcinoma of prostate [facility]) Prior history.  Outpatient follow-up. Xtandi on hold due to anemia.  Physical deconditioning PT OT evaluation recommends SNF placement.  Social worker working. Patient will be able to go on Monday 12/5. Will order COVID test today.    Body mass index is 24.11 kg/m.  Nutrition Problem: Increased nutrient needs Etiology: wound healing Pressure Injury 10/02/21 Coccyx Medial Stage 3 -  Full thickness tissue loss. Subcutaneous fat may be visible but bone, tendon or muscle are NOT exposed. (Active)  10/02/21 1425  Location: Coccyx  Location Orientation: Medial  Staging: Stage 3 -  Full thickness tissue loss. Subcutaneous fat may be visible but bone, tendon or muscle are NOT exposed.  Wound Description (Comments):   Present on Admission: Yes    Subjective: Significantly anxious.  Refusing to take oral medication since yesterday.  Feels that the medications are not agreeing with him.  Tells me that he is bloated.  No other acute complaint.  After some explanation currently agreeable to take medication.  Objective: Tachycardic.  Exam: General: Appear in mild distress, no Rash; Oral Mucosa Clear, moist. no Abnormal Neck Mass Or lumps, Conjunctiva normal  Cardiovascular: S1 and S2 Present, no Murmur, Respiratory: good respiratory effort, Bilateral Air entry present and CTA, no Crackles, no wheezes Abdomen: Bowel Sound present, Soft and no tenderness Extremities: no Pedal edema Neurology: alert and oriented to time, place,  and person affect anxious. no  new focal deficit Gait not checked due to patient safety concerns     Data Reviewed: There are no new results to review at this time.  Disposition:  Status is: Inpatient  Remains inpatient appropriate because: Unsafe discharge.  Will discharge to SNF tomorrow.  Family Communication: None at bedside  DVT Prophylaxis: SCDs Start: 10/02/21 0452   Time spent: 35 minutes.   Author: Berle Mull  10/07/2021 4:13 PM  To reach On-call, see care teams to locate the attending and reach out via [email or web address]. Between 7PM-7AM, please contact night-coverage If you still have difficulty reaching the attending provider, please page the [facility] (Director on Call) for [facility] on amion for assistance.

## 2021-10-08 ENCOUNTER — Ambulatory Visit: Payer: Medicare Other | Admitting: Internal Medicine

## 2021-10-08 DIAGNOSIS — R296 Repeated falls: Secondary | ICD-10-CM | POA: Diagnosis not present

## 2021-10-08 DIAGNOSIS — R58 Hemorrhage, not elsewhere classified: Secondary | ICD-10-CM | POA: Diagnosis not present

## 2021-10-08 DIAGNOSIS — I482 Chronic atrial fibrillation, unspecified: Secondary | ICD-10-CM | POA: Diagnosis not present

## 2021-10-08 DIAGNOSIS — R2681 Unsteadiness on feet: Secondary | ICD-10-CM | POA: Diagnosis not present

## 2021-10-08 DIAGNOSIS — L89303 Pressure ulcer of unspecified buttock, stage 3: Secondary | ICD-10-CM | POA: Diagnosis not present

## 2021-10-08 DIAGNOSIS — Z743 Need for continuous supervision: Secondary | ICD-10-CM | POA: Diagnosis not present

## 2021-10-08 DIAGNOSIS — R600 Localized edema: Secondary | ICD-10-CM | POA: Diagnosis not present

## 2021-10-08 DIAGNOSIS — M6281 Muscle weakness (generalized): Secondary | ICD-10-CM | POA: Diagnosis not present

## 2021-10-08 DIAGNOSIS — E119 Type 2 diabetes mellitus without complications: Secondary | ICD-10-CM | POA: Diagnosis not present

## 2021-10-08 DIAGNOSIS — E1149 Type 2 diabetes mellitus with other diabetic neurological complication: Secondary | ICD-10-CM | POA: Diagnosis not present

## 2021-10-08 DIAGNOSIS — M6259 Muscle wasting and atrophy, not elsewhere classified, multiple sites: Secondary | ICD-10-CM | POA: Diagnosis not present

## 2021-10-08 DIAGNOSIS — E440 Moderate protein-calorie malnutrition: Secondary | ICD-10-CM | POA: Diagnosis not present

## 2021-10-08 DIAGNOSIS — I10 Essential (primary) hypertension: Secondary | ICD-10-CM | POA: Diagnosis not present

## 2021-10-08 DIAGNOSIS — K922 Gastrointestinal hemorrhage, unspecified: Secondary | ICD-10-CM | POA: Diagnosis not present

## 2021-10-08 DIAGNOSIS — Z741 Need for assistance with personal care: Secondary | ICD-10-CM | POA: Diagnosis not present

## 2021-10-08 DIAGNOSIS — D689 Coagulation defect, unspecified: Secondary | ICD-10-CM | POA: Diagnosis not present

## 2021-10-08 DIAGNOSIS — I5032 Chronic diastolic (congestive) heart failure: Secondary | ICD-10-CM | POA: Diagnosis not present

## 2021-10-08 DIAGNOSIS — D649 Anemia, unspecified: Secondary | ICD-10-CM | POA: Diagnosis not present

## 2021-10-08 DIAGNOSIS — D62 Acute posthemorrhagic anemia: Secondary | ICD-10-CM | POA: Diagnosis not present

## 2021-10-08 DIAGNOSIS — D7589 Other specified diseases of blood and blood-forming organs: Secondary | ICD-10-CM | POA: Diagnosis not present

## 2021-10-08 DIAGNOSIS — R1312 Dysphagia, oropharyngeal phase: Secondary | ICD-10-CM | POA: Diagnosis not present

## 2021-10-08 DIAGNOSIS — R5381 Other malaise: Secondary | ICD-10-CM | POA: Diagnosis not present

## 2021-10-08 LAB — SARS CORONAVIRUS 2 (TAT 6-24 HRS): SARS Coronavirus 2: NEGATIVE

## 2021-10-08 LAB — GLUCOSE, CAPILLARY
Glucose-Capillary: 216 mg/dL — ABNORMAL HIGH (ref 70–99)
Glucose-Capillary: 205 mg/dL — ABNORMAL HIGH (ref 70–99)

## 2021-10-08 LAB — RESP PANEL BY RT-PCR (FLU A&B, COVID) ARPGX2
Influenza A by PCR: NEGATIVE
Influenza B by PCR: NEGATIVE
SARS Coronavirus 2 by RT PCR: NEGATIVE

## 2021-10-08 MED ORDER — DOXAZOSIN MESYLATE 2 MG PO TABS: 8.0000 mg | ORAL_TABLET | Freq: Every evening | ORAL | 0 refills | Status: DC

## 2021-10-08 MED ORDER — METOPROLOL SUCCINATE ER 50 MG PO TB24: 50.0000 mg | ORAL_TABLET | Freq: Every day | ORAL | 0 refills | Status: DC

## 2021-10-08 MED ORDER — SIMETHICONE 80 MG PO CHEW: 80.0000 mg | CHEWABLE_TABLET | Freq: Four times a day (QID) | ORAL | 0 refills | Status: DC | PRN

## 2021-10-08 NOTE — Assessment & Plan Note (Addendum)
On coreg, lasix, verapamil, cardura and lasix.  Blood pressure is normal only on toprol xl for now.  For now we will hold, verapamil and reduce the dose of cardura.

## 2021-10-08 NOTE — TOC Transition Note (Signed)
Transition of Care [facility]) - CM/SW Discharge Note   Patient Details  Name: [name] MRN: [id number] Date of Birth: Oct 18, 1933  [facility] CM/SW Contact:  Trish Mage, LCSW Phone Number: 10/08/2021, 12:12 PM   Clinical Narrative:   Patient who is stable for d/c will transfer to [facility] today.  Family alerted.  PTAR arranged.  Nursing, please call report to [phone]. TOC sign off.    Final next level of care: Skilled Nursing Facility Barriers to Discharge: Barriers Resolved   Patient Goals and CMS Choice     Choice offered to / list presented to : Patient  Discharge Placement                       Discharge Plan and Services   Discharge Planning Services: CM Consult Post Acute Care Choice: [place]                               Social Determinants of Health (SDOH) Interventions     Readmission Risk Interventions No flowsheet data found.

## 2021-10-08 NOTE — Discharge Summary (Signed)
Physician Discharge Summary   Patient name: [name]  Admit date:     10/01/2021  Discharge date: 10/08/21    Discharge Physician: Berle Mull   PCP: [name], MD   Recommendations at discharge: recheck CBC, Hb>8 resume xtandi.   Discharge Diagnoses Principal Problem:   Acute GI bleeding Active Problems:   Acute blood loss anemia, symptomatic secondary to GI bleed   Chronic atrial fibrillation with RVR (HCC)   S/P aortic valve replacement with bioprosthetic valve   Chronic heart failure with preserved ejection fraction (HCC)   Essential hypertension   Coagulopathy (HCC)   Dizziness   Abdominal bloating   Diabetes mellitus with coincident hypertension ([place])   Diabetic neuropathy (HCC)   Diabetic retinopathy ([place])   Pressure injury of skin   Carcinoma of prostate [facility])   Physical deconditioning   Hospital Course   11/29 presents to the hospital with GI bleed with supratherapeutic INR reversed with vitamin K.  Received 2 PRBC transfusion. 11/30 underwent EGD nonbleeding esophageal ulcer.  Continues to have melena. 12/1 hemoglobin remaining stable.   12/3 medically stable.  Now awaiting placement on Monday.   * Acute GI bleeding Presents with anemia.  Hemoglobin 6.2 on admission. EGD 11/30 shows nonbleeding esophageal ulcer. Patient is not on any aspirin prior to admission. GI Would recommend to hold off on anticoagulation on discharge Continue PPI.  Change to carb modified diet. Return to GI clinic in 1 month  GI clinic will call for pathology results.  Acute blood loss anemia, symptomatic secondary to GI bleed Hemoglobin now stable.SP 2 PRBC transfusion.   Abdominal bloating Refusing to take any medication as he thinks that the medication is contributing to his bloating. Will provide simethicone.  Dizziness Associated with RVR. No focal deficit at the time of my evaluation. Dizziness resolved with RVR resolution. PT OT  consulted.  Coagulopathy (HCC) INR was supratherapeutic with bleeding. Currently INR is subtherapeutic after receiving vitamin K.  Essential hypertension On coreg, lasix, verapamil, cardura and lasix.  Blood pressure is normal only on toprol xl for now.  For now we will hold, verapamil and reduce the dose of cardura.   Chronic heart failure with preserved ejection fraction (HCC) No evidence of volume overload. Resume home lasix.   S/P aortic valve replacement with bioprosthetic valve Outpatient echocardiogram planned by cardiology. Follow up as recommended by Cardiology.   Chronic atrial fibrillation with RVR (HCC) Patient was on Coumadin and Coreg. Supratherapeutic INR reversed with vitamin K. Rate controlled with Coreg.  Switch to Toprol-XL for ease of administration. Not a good candidate for resumption of anticoagulation.  Daughter currently agrees.  Diabetic retinopathy [facility]) Outpatient follow-up.  Diabetic neuropathy (HCC) On gabapentin.  Resume  Diabetes mellitus with coincident hypertension ([place]) Hemoglobin A1c controlled. Was on sliding scale. Will resume home regimen.   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  Continue foam dressing.  Carcinoma of prostate [facility]) Prior history.  Outpatient follow-up. Xtandi on hold due to anemia. Recheck CBC in 1 week and resume.   Physical deconditioning PT OT evaluation recommends SNF placement.  Social worker working. Patient will be able to go on Monday 12/5.   Pressure Injury 10/02/21 Coccyx Medial Stage 3 -  Full thickness tissue loss. Subcutaneous fat may be visible but bone, tendon or muscle are NOT exposed. (Active)  10/02/21 1425  Location: Coccyx  Location Orientation: Medial  Staging: Stage 3 -  Full thickness tissue loss. Subcutaneous fat may be visible but  bone, tendon or muscle are NOT exposed.  Wound Description (Comments):   Present on Admission: Yes    Procedures performed: EGD   Condition at  discharge: good  Exam General: Appear in mild distress; no visible Abnormal Neck Mass Or lumps, Conjunctiva normal Cardiovascular: S1 and S2 Present, no Murmur, Respiratory: good respiratory effort, Bilateral Air entry present and CTA, no Crackles, no wheezes Abdomen: Bowel Sound present Extremities: trace Pedal edema Neurology: alert and oriented to time, place, and person  Disposition: Skilled nursing facility  Discharge time: greater than 30 minutes.  Contact information for follow-up providers     [name], MD. Schedule an appointment as soon as possible for a visit in 1 week(s).   Specialty: Family Medicine Why: CBC in 1 week, resume xtandi Contact information: [address] [place] [address] [phone]         Gastroenterology, [facility] [place]. Schedule an appointment as soon as possible for a visit in 1 month(s).   Contact information: [place] [address] [phone]              Contact information for after-discharge care     Destination     [facility] Preferred SNF .   Service: Skilled Nursing Contact information: [address] [place] [address] [phone]                     Allergies as of 10/08/2021       Reactions   Amoxicillin Other (See Comments)   Burning of mouth, tongue and lips.  itchy, burning throat        Medication List     STOP taking these medications    carvedilol 12.5 MG tablet Commonly known as: COREG   clindamycin 300 MG capsule Commonly known as: CLEOCIN   verapamil 180 MG CR tablet Commonly known as: CALAN-SR   warfarin 5 MG tablet Commonly known as: COUMADIN   warfarin 7.5 MG tablet Commonly known as: COUMADIN       TAKE these medications    Accu-Chek Aviva Plus test strip Generic drug: glucose blood USE TO CHECK BLOOD SUGARS 1 TO 2 TIMES A DAY   Accu-Chek Softclix Lancets lancets daily. use  as directed   B Complex 50 Tabs Take 1 tablet by mouth daily with breakfast.   cholecalciferol 25 MCG (1000 UNIT) tablet Commonly known as: VITAMIN D3 Take 1,000 Units by mouth 2 (two) times daily.   dapagliflozin propanediol 10 MG Tabs tablet Commonly known as: Farxiga Take 1 tablet (10 mg total) by mouth daily before breakfast.   doxazosin 2 MG tablet Commonly known as: CARDURA Take 4 tablets (8 mg total) by mouth every evening. What changed: medication strength   ferrous sulfate 325 (65 FE) MG tablet Take 325 mg by mouth See admin instructions. Take one tablet (325 mg) by mouth twice daily - with lunch and supper   furosemide 40 MG tablet Commonly known as: LASIX Take 1 tablet (40 mg total) by mouth daily. What changed: when to take this   gabapentin 100 MG capsule Commonly known as: Neurontin Take 1 capsule (100 mg total) by mouth at bedtime.   glipiZIDE 5 MG tablet Commonly known as: GLUCOTROL Take 5 mg by mouth 2 (two) times daily with breakfast and lunch.   lisinopril 20 MG tablet Commonly known as: ZESTRIL Take 1 tablet by mouth once daily What changed: when to take this  metFORMIN 1000 MG tablet Commonly known as: GLUCOPHAGE Take 1 tablet by mouth 2 (two) times daily with breakfast and lunch.   metoprolol succinate 50 MG 24 hr tablet Commonly known as: TOPROL-XL Take 1 tablet (50 mg total) by mouth daily. Take with or immediately following a meal.   pantoprazole 40 MG tablet Commonly known as: PROTONIX Take 40 mg by mouth daily.   rosuvastatin 5 MG tablet Commonly known as: CRESTOR TAKE 1 TABLET BY MOUTH ONCE DAILY AT  6PM What changed: See the new instructions.   Selenium 200 MCG Caps Take 200 mcg by mouth daily with breakfast.   simethicone 80 MG chewable tablet Commonly known as: MYLICON Chew 1 tablet (80 mg total) by mouth 4 (four) times daily as needed for flatulence.   TYLENOL ARTHRITIS PAIN PO Take 650 mg by mouth daily with  breakfast.   Vitamin A 2400 MCG (8000 UT) Tabs Take 2,400 mcg by mouth daily with breakfast.   vitamin B-12 1000 MCG tablet Commonly known as: CYANOCOBALAMIN Take 1,000 mcg by mouth 2 (two) times daily.   vitamin C 500 MG tablet Commonly known as: ASCORBIC ACID Take 500 mg by mouth 2 (two) times daily.   Xtandi 40 MG capsule Generic drug: enzalutamide Take 160 mg by mouth daily at 2 PM.   zinc gluconate 50 MG tablet Take 50 mg by mouth daily with breakfast.               Discharge Care Instructions  (From admission, onward)           Start     Ordered   10/08/21 0000  Discharge wound care:       Comments: Single layer of xeroform to the elbow and bilateral LE wounds, top with foam. Change every other day, ok to peel back the foam dressings and change xeroform every other day. Leaving foam in place for 3 days.   10/08/21 1100            Filed Weights   10/01/21 1829 10/03/21 1145  Weight: 73 kg 73 kg    DG Pelvis 1-2 Views  Result Date: 10/02/2021 CLINICAL DATA:  Weakness fiducial markers EXAM: PELVIS - 1-2 VIEW COMPARISON:  None. FINDINGS: There is no evidence of pelvic fracture or diastasis. No pelvic bone lesions are seen. Noted in the prostate gland. IMPRESSION: Negative. Electronically Signed   By: Misty Stanley M.D.   On: 10/02/2021 07:26   CT Head Wo Contrast  Result Date: 10/01/2021 CLINICAL DATA:  Initial evaluation for acute headache, fall. EXAM: CT HEAD WITHOUT CONTRAST TECHNIQUE: Contiguous axial images were obtained from the base of the skull through the vertex without intravenous contrast. COMPARISON:  None available. FINDINGS: Brain: Diffuse prominence of the CSF containing spaces compatible generalized age-related cerebral atrophy. Mild chronic microvascular ischemic changes noted involving the supraorbital cerebral white matter. No acute intracranial hemorrhage. No acute large vessel territory infarct. No mass lesion. Probable chronic subdural  hygroma overlies the right cerebral convexity, measuring up to 1 cm in maximal thickness. Associated mild right-to-left shift of up to 4 mm. No hydrocephalus or trapping. Vascular: No hyperdense vessel. Scattered vascular calcifications noted within the carotid siphons. Skull: Scalp soft tissues and calvarium demonstrate no acute finding. Scattered vascular calcifications noted about the scalp. Sinuses/Orbits: Globes and orbital soft tissues demonstrate no acute finding. Paranasal sinuses are largely clear. No significant mastoid effusion. Other: None. IMPRESSION: 1. No acute intracranial abnormality. 2. 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 Associated mild right-to-left shift of up to 4 mm. No hydrocephalus or trapping. 3. Age-related cerebral atrophy with mild chronic small vessel ischemic disease. Electronically Signed   By: Jeannine Boga M.D.   On: 10/01/2021 21:17   DG Chest Port 1 View  Result Date: 10/01/2021 CLINICAL DATA:  Anemia. EXAM: PORTABLE CHEST 1 VIEW COMPARISON:  Chest x-ray 02/26/2013. FINDINGS: Cardiomediastinal silhouette is within normal limits for projection. The heart is enlarged, unchanged. Patient is status post TAVR. The lungs and costophrenic angles are clear. There is no pneumothorax. Degenerative changes affect both shoulders. IMPRESSION: 1. No acute cardiopulmonary process. 2. Cardiomegaly. Electronically Signed   By: Ronney Asters M.D.   On: 10/01/2021 21:48   Results for orders placed or performed during the hospital encounter of 10/01/21  Resp Panel by RT-PCR (Flu A&B, Covid) Nasopharyngeal Swab     Status: None   Collection Time: 10/01/21  9:21 PM   Specimen: Nasopharyngeal Swab; Nasopharyngeal(NP) swabs in vial transport medium  Result Value Ref Range Status   SARS Coronavirus 2 by RT PCR NEGATIVE NEGATIVE Final    Comment: (NOTE) SARS-CoV-2 target nucleic acids are NOT  DETECTED.  The SARS-CoV-2 RNA is generally detectable in upper respiratory specimens during the acute phase of infection. The lowest concentration of SARS-CoV-2 viral copies this assay can detect is 138 copies/mL. A negative result does not preclude SARS-Cov-2 infection and should not be used as the sole basis for treatment or other patient management decisions. A negative result may occur with  improper specimen collection/handling, submission of specimen other than nasopharyngeal swab, presence of viral mutation(s) within the areas targeted by this assay, and inadequate number of viral copies(<138 copies/mL). A negative result must be combined with clinical observations, patient history, and epidemiological information. The expected result is Negative.  Fact Sheet for Patients:  EntrepreneurPulse.com.au  Fact Sheet for Healthcare Providers:  IncredibleEmployment.be  This test is no t yet approved or cleared by the [place] FDA and  has been authorized for detection and/or diagnosis of SARS-CoV-2 by FDA under an Emergency Use Authorization (EUA). This EUA will remain  in effect (meaning this test can be used) for the duration of the COVID-19 declaration under Section 564(b)(1) of the Act, 21 U.S.C.section 360bbb-3(b)(1), unless the authorization is terminated  or revoked sooner.       Influenza A by PCR NEGATIVE NEGATIVE Final   Influenza B by PCR NEGATIVE NEGATIVE Final    Comment: (NOTE) The Xpert Xpress SARS-CoV-2/FLU/RSV plus assay is intended as an aid in the diagnosis of influenza from Nasopharyngeal swab specimens and should not be used as a sole basis for treatment. Nasal washings and aspirates are unacceptable for Xpert Xpress SARS-CoV-2/FLU/RSV testing.  Fact Sheet for Patients: EntrepreneurPulse.com.au  Fact Sheet for Healthcare Providers: IncredibleEmployment.be  This test is not yet  approved or cleared by the [place] FDA and has been authorized for detection and/or diagnosis of SARS-CoV-2 by FDA under an Emergency Use Authorization (EUA). This EUA will remain in effect (meaning this test can be used) for the duration of the COVID-19 declaration under Section 564(b)(1) of the Act, 21 U.S.C. section 360bbb-3(b)(1), unless the authorization is terminated or revoked.  Performed at [facility], [address] [place], [place] [address]    Recent Labs  Lab 10/02/21 [phone] 10/03/21 0514 10/03/21 1958 10/04/21 0453 10/04/21 1247 10/05/21 1155 10/06/21 1041  WBC 7.3   < > 14.5* 11.7*  11.9* 9.6 7.5  NEUTROABS 5.6  --   --   --   --   --   --   HGB 7.6*   < > 8.5* 8.4* 8.2* 8.8* 7.9*  HCT 23.2*   < > 25.4* 25.8* 26.6* 28.2* 25.4*  MCV 97.5   < > 97.7 99.2 102.7* 102.5* 102.0*  PLT 214   < > 222 231 249 196 220   < > = values in this interval not displayed.   Recent Labs  Lab 10/02/21 0613 10/03/21 1244 10/04/21 0453 10/05/21 0917 10/06/21 1041  NA 137 138 136 134* 132*  K 4.7 4.7 4.2 4.6 3.4*  CL 108 109 111 110 106  CO2 21* 18* 19* 16* 19*  GLUCOSE 118* 174* 220* 201* 324*  BUN 100* 64* 55* 46* 38*  CREATININE 1.11 1.08 0.98 0.98 1.07  CALCIUM 8.6* 8.8* 8.2* 8.3* 8.0*  MG  --   --   --   --  2.2   Recent Labs  Lab 10/02/21 0613 10/05/21 0917  AST 23 25  ALT 17 19  ALKPHOS 44 51  BILITOT 1.2 1.0  PROT 5.5* 5.0*  ALBUMIN 2.9* 2.6*   Recent Labs  Lab 10/07/21 0743 10/07/21 1106 10/07/21 1537 10/07/21 2056 10/08/21 0752  GLUCAP 269* 318* 224* 209* 216*    Author: Berle Mull [facility] 10/08/2021, 11:07 AM

## 2021-10-08 NOTE — Progress Notes (Signed)
Report called and given to Probation officer at [facility]. Seth Bake aware AVS will be with patient.

## 2021-10-09 ENCOUNTER — Encounter: Payer: Self-pay | Admitting: Adult Health

## 2021-10-09 ENCOUNTER — Non-Acute Institutional Stay (SKILLED_NURSING_FACILITY): Payer: Medicare Other | Admitting: Adult Health

## 2021-10-09 DIAGNOSIS — D62 Acute posthemorrhagic anemia: Secondary | ICD-10-CM

## 2021-10-09 DIAGNOSIS — I482 Chronic atrial fibrillation, unspecified: Secondary | ICD-10-CM | POA: Diagnosis not present

## 2021-10-09 DIAGNOSIS — R5381 Other malaise: Secondary | ICD-10-CM | POA: Diagnosis not present

## 2021-10-09 DIAGNOSIS — I10 Essential (primary) hypertension: Secondary | ICD-10-CM

## 2021-10-09 DIAGNOSIS — I5032 Chronic diastolic (congestive) heart failure: Secondary | ICD-10-CM

## 2021-10-09 DIAGNOSIS — K922 Gastrointestinal hemorrhage, unspecified: Secondary | ICD-10-CM | POA: Diagnosis not present

## 2021-10-09 DIAGNOSIS — D689 Coagulation defect, unspecified: Secondary | ICD-10-CM | POA: Diagnosis not present

## 2021-10-09 DIAGNOSIS — E1149 Type 2 diabetes mellitus with other diabetic neurological complication: Secondary | ICD-10-CM

## 2021-10-09 NOTE — Progress Notes (Signed)
Location:  [facility] Room Number: 585 A Place of Service:  SNF (31) Provider:  Durenda Age, DNP, FNP-BC  Patient Care Team: [name], MD as PCP - General (Family Medicine) Sherren Mocha, MD as Attending Physician (Cardiology) [name], MD (Inactive) as Attending Physician (Cardiothoracic Surgery) [name], MD (Inactive) as Attending Physician (Cardiothoracic Surgery)  Extended Emergency Contact Information Primary Emergency Contact: [place]          [place], [place] [address] [name] of [place] Phone: [phone] Mobile Phone: [phone] Relation: Daughter Secondary Emergency Contact: Tennis Must Address: [address]          [place], [place] [address] [name] of [place] Phone: [phone] Relation: Spouse  Code Status:  FULL CODE  Goals of care: Advanced Directive information Advanced Directives 10/09/2021  Does Patient Have a Medical Advance Directive? No  Type of Advance Directive -  Copy of [place] in Chart? -  Would patient like information on creating a medical advance directive? No - Patient declined  Pre-existing out of facility DNR order (yellow form or pink MOST form) -     Chief Complaint  Patient presents with   Hospitalization Follow-up    Hospitalization Follow-up    HPI:  Pt is a 87 y.o. male who was admitted to [facility] on 10/08/21 post hospital admission 10/01/2021 to 10/08/2021.  He has a medical history of atrial fibrillation, S/P TAVR, diastolic CHF, hypertension, diabetes mellitus, peripheral disease and prostate cancer.  He had a routine follow-up with his primary care physician and had blood work done which showed significant fall in his hemoglobin. He was advised to go to ED. He had a fall at home a week before hospitalization sustaining a small bruise on his shoulder with skin tears to both lower  extremities/shins. He denies noticing bloody stools nor abdominal pain.  In the ED, stool was positive for occult blood with hemoglobin 6.4, which is a drop from 11.9 in May 2022.  He was initially hypotensive and improved with fluid bolus and transfusion of 2 units PRBC.  INR was 7 so vitamin K was given.  GI was consulted and EGD was performed on 11/30 which showed nonbleeding esophageal ulcer.  GI recommended to hold off on anticoagulation upon discharge.   Past Medical History:  Diagnosis Date   Anemia    LOW PLATELETS OTHER DAY  PER PT   Anticoagulated on Coumadin 12/21/2012   Aortic stenosis    Arthritis    "left wrist; back sometimes" (01/21/2013)   Atrial fibrillation ([place])    [name], DR COOPER   Bradycardia    Exertional shortness of breath    Heart murmur    "I've had it for years; runs in the family on daddy's side" (01/21/2013)   HTN (hypertension)    Hyperlipemia 12/21/2012   Prostate cancer ([place])    "had 40 tx of radiation in 2009" (01/21/2013)   S/P aortic valve replacement with bioprosthetic valve 01/19/2013   Transcatheter Aortic Valve Replacement using 33mm Sapien bioprosthetic tissue valve via transapical approach   Type II diabetes mellitus ([facility])    Past Surgical History:  Procedure Laterality Date   BALLOON DILATION N/A 08/02/2020   Procedure: BALLOON DILATION;  Surgeon: [name], MD;  Location: [facility];  Service: Gastroenterology;  Laterality: N/A;   BIOPSY  10/03/2021   Procedure: BIOPSY;  Surgeon: [name], MD;  Location: [facility];  Service: Endoscopy;;  CARDIAC CATHETERIZATION  12/16/2012   CARDIAC VALVE REPLACEMENT  01/19/2013   AVR   CATARACT EXTRACTION W/ INTRAOCULAR LENS  IMPLANT, BILATERAL     ESOPHAGOGASTRODUODENOSCOPY N/A 10/03/2021   Procedure: ESOPHAGOGASTRODUODENOSCOPY (EGD);  Surgeon: [name], MD;  Location: [facility];  Service: Endoscopy;  Laterality: N/A;   ESOPHAGOGASTRODUODENOSCOPY (EGD) WITH PROPOFOL N/A 08/02/2020    Procedure: ESOPHAGOGASTRODUODENOSCOPY (EGD) WITH PROPOFOL;  Surgeon: [name], MD;  Location: [facility];  Service: Gastroenterology;  Laterality: N/A;   EYE SURGERY     INTRAOPERATIVE TRANSESOPHAGEAL ECHOCARDIOGRAM N/A 01/19/2013   Procedure: INTRAOPERATIVE TRANSESOPHAGEAL ECHOCARDIOGRAM;  Surgeon: [name], MD;  Location: [facility];  Service: Open Heart Surgery;  Laterality: N/A;   ORIF FOREARM FRACTURE Left 1952   "compound fx" (01/21/2013)   TRANSTHORACIC ECHOCARDIOGRAM  09/04/10, 09/07/08    Allergies  Allergen Reactions   Amoxicillin Other (See Comments)    Burning of mouth, tongue and lips.  itchy, burning throat    Outpatient Encounter Medications as of 10/09/2021  Medication Sig   ACCU-CHEK AVIVA PLUS test strip USE TO CHECK BLOOD SUGARS 1 TO 2 TIMES A DAY   ACCU-CHEK SOFTCLIX LANCETS lancets daily. use as directed   Acetaminophen (TYLENOL ARTHRITIS PAIN PO) Take 650 mg by mouth daily with breakfast.   B Complex Vitamins (B COMPLEX 50) TABS Take 1 tablet by mouth daily with breakfast.   bisacodyl (DULCOLAX) 10 MG suppository Place rectally as needed for moderate constipation. If not relieved by MOM, give 10 mg Bisacodyl suppositiory in 24 hours as needed   cholecalciferol (VITAMIN D3) 25 MCG (1000 UNIT) tablet Take 1,000 Units by mouth 2 (two) times daily.   dapagliflozin propanediol (FARXIGA) 10 MG TABS tablet Take 1 tablet (10 mg total) by mouth daily before breakfast.   doxazosin (CARDURA) 2 MG tablet Take 4 tablets (8 mg total) by mouth every evening.   enzalutamide (XTANDI) 40 MG capsule Take 160 mg by mouth daily at 2 PM.   ferrous sulfate 325 (65 FE) MG tablet Take 325 mg by mouth See admin instructions. Take one tablet (325 mg) by mouth twice daily - with lunch and supper   furosemide (LASIX) 40 MG tablet Take 1 tablet (40 mg total) by mouth daily. (Patient taking differently: Take 40 mg by mouth daily with lunch.)   gabapentin (NEURONTIN) 100 MG capsule Take 1  capsule (100 mg total) by mouth at bedtime.   glipiZIDE (GLUCOTROL) 5 MG tablet Take 5 mg by mouth daily.   lisinopril (ZESTRIL) 20 MG tablet Take 1 tablet by mouth once daily (Patient taking differently: Take 20 mg by mouth daily with breakfast.)   magnesium hydroxide (MILK OF MAGNESIA) 400 MG/5ML suspension Take by mouth daily as needed for mild constipation. If no BM in 3 days, give 30 cc Milk of Magnesium in 24 hours as needed   metFORMIN (GLUCOPHAGE) 1000 MG tablet Take 1 tablet by mouth 2 (two) times daily with breakfast and lunch.   metoprolol succinate (TOPROL-XL) 50 MG 24 hr tablet Take 1 tablet (50 mg total) by mouth daily. Take with or immediately following a meal.   pantoprazole (PROTONIX) 40 MG tablet Take 40 mg by mouth daily.   rosuvastatin (CRESTOR) 5 MG tablet TAKE 1 TABLET BY MOUTH ONCE DAILY AT  6PM (Patient taking differently: Take 5 mg by mouth every evening.)   Selenium 200 MCG CAPS Take 200 mcg by mouth daily with breakfast.   simethicone (MYLICON) 80 MG chewable tablet Chew 1 tablet (80 mg  total) by mouth 4 (four) times daily as needed for flatulence.   Sodium Phosphates (RA SALINE ENEMA RE) If not relieved by Biscodyl suppository, give disposable Saline Enema rectally x1 dose 24 hrs as needed   Vitamin A 2400 MCG (8000 UT) TABS Take 2,400 mcg by mouth daily with breakfast.   vitamin B-12 (CYANOCOBALAMIN) 1000 MCG tablet Take 1,000 mcg by mouth 2 (two) times daily.   vitamin C (ASCORBIC ACID) 500 MG tablet Take 500 mg by mouth 2 (two) times daily.   zinc gluconate 50 MG tablet Take 50 mg by mouth daily with breakfast.   No facility-administered encounter medications on file as of 10/09/2021.    Review of Systems  GENERAL: No change in appetite, no fatigue, no weight changes, no fever or chills  MOUTH and THROAT: Denies oral discomfort, gingival pain or bleeding RESPIRATORY: no cough, SOB, DOE, wheezing, hemoptysis CARDIAC: No chest pain or palpitations GI: No abdominal  pain, diarrhea, constipation, heart burn, nausea or vomiting GU: Denies dysuria, frequency, hematuria, incontinence, or discharge NEUROLOGICAL: Denies dizziness, syncope, numbness, or headache PSYCHIATRIC: Denies feelings of depression or anxiety. No report of hallucinations, insomnia, paranoia, or agitation   Immunization History  Administered Date(s) Administered   Influenza,inj,Quad PF,6+ Mos 07/15/2016, 08/01/2017, 07/24/2018, 08/27/2019, 10/05/2020, 07/12/2021   Influenza,inj,quad, With Preservative 07/21/2014, 08/14/2015   Influenza-Unspecified 07/05/2012   Janssen (J&J) SARS-COV-2 Vaccination 02/13/2020, 09/19/2020   Pneumococcal Conjugate PCV 7 08/04/2017   Pneumococcal Conjugate-13 04/28/2014   Pneumococcal Polysaccharide-23 09/03/2010   Tdap 02/05/2016   Pertinent  Health Maintenance Due  Topic Date Due   OPHTHALMOLOGY EXAM  Never done   HEMOGLOBIN A1C  07/18/2013   FOOT EXAM  03/09/2022   INFLUENZA VACCINE  Completed   Fall Risk 10/05/2021 10/05/2021 10/06/2021 10/07/2021 10/08/2021  Falls in the past year? - - - - -  Was there an injury with Fall? - - - - -  Fall Risk Category Calculator - - - - -  Fall Risk Category - - - - -  Patient Fall Risk Level High fall risk High fall risk High fall risk High fall risk High fall risk     Vitals:   10/09/21 1332  Weight: 165 lb 12.8 oz (75.2 kg)   Body mass index is 24.84 kg/m.  Physical Exam  GENERAL APPEARANCE: Well nourished. In no acute distress. Normal body habitus SKIN:  bruise on top of right eyebrow MOUTH and THROAT: Lips are without lesions. Oral mucosa is moist and without lesions.  RESPIRATORY: Breathing is even & unlabored, BS CTAB CARDIAC: RRR, no murmur,no extra heart sounds, BLE fett trace edema and LUE 1+edema GI: Abdomen soft, normal BS, no masses, no tenderness NEUROLOGICAL: There is no tremor. Speech is clear. Alert and oriented X 3. PSYCHIATRIC:  Affect and behavior are appropriate  Labs  reviewed: Recent Labs    11/22/20 1410 04/04/21 0918 10/01/21 2019 10/04/21 0453 10/05/21 0917 10/06/21 1041  NA 140 143   < > 136 134* 132*  K 4.6 5.2   < > 4.2 4.6 3.4*  CL 102 101   < > 111 110 106  CO2 23 25   < > 19* 16* 19*  GLUCOSE 141* 195*   < > 220* 201* 324*  BUN 41* 46*   < > 55* 46* 38*  CREATININE 1.18 1.38*   < > 0.98 0.98 1.07  CALCIUM 9.3 9.7   < > 8.2* 8.3* 8.0*  MG 2.1 2.3  --   --   --  2.2   < > = values in this interval not displayed.   Recent Labs    10/02/21 0613 10/05/21 0917  AST 23 25  ALT 17 19  ALKPHOS 44 51  BILITOT 1.2 1.0  PROT 5.5* 5.0*  ALBUMIN 2.9* 2.6*   Recent Labs    10/02/21 0613 10/03/21 0514 10/04/21 1247 10/05/21 1155 10/06/21 1041  WBC 7.3   < > 11.9* 9.6 7.5  NEUTROABS 5.6  --   --   --   --   HGB 7.6*   < > 8.2* 8.8* 7.9*  HCT 23.2*   < > 26.6* 28.2* 25.4*  MCV 97.5   < > 102.7* 102.5* 102.0*  PLT 214   < > 249 196 220   < > = values in this interval not displayed.   No results found for: TSH Lab Results  Component Value Date   HGBA1C 7.6 (H) 01/15/2013   Lab Results  Component Value Date   CHOL 81 (L) 08/17/2018   HDL 28 (L) 08/17/2018   LDLCALC 37 08/17/2018   TRIG 82 08/17/2018   CHOLHDL 2.9 08/17/2018    Significant Diagnostic Results in last 30 days:  DG Pelvis 1-2 Views  Result Date: 10/02/2021 CLINICAL DATA:  Weakness fiducial markers EXAM: PELVIS - 1-2 VIEW COMPARISON:  None. FINDINGS: There is no evidence of pelvic fracture or diastasis. No pelvic bone lesions are seen. Noted in the prostate gland. IMPRESSION: Negative. Electronically Signed   By: Misty Stanley M.D.   On: 10/02/2021 07:26   CT Head Wo Contrast  Result Date: 10/01/2021 CLINICAL DATA:  Initial evaluation for acute headache, fall. EXAM: CT HEAD WITHOUT CONTRAST TECHNIQUE: Contiguous axial images were obtained from the base of the skull through the vertex without intravenous contrast. COMPARISON:  None available. FINDINGS: Brain:  Diffuse prominence of the CSF containing spaces compatible generalized age-related cerebral atrophy. Mild chronic microvascular ischemic changes noted involving the supraorbital cerebral white matter. No acute intracranial hemorrhage. No acute large vessel territory infarct. No mass lesion. 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 Associated mild right-to-left shift of up to 4 mm. No hydrocephalus or trapping. Vascular: No hyperdense vessel. Scattered vascular calcifications noted within the carotid siphons. Skull: Scalp soft tissues and calvarium demonstrate no acute finding. Scattered vascular calcifications noted about the scalp. Sinuses/Orbits: Globes and orbital soft tissues demonstrate no acute finding. Paranasal sinuses are largely clear. No significant mastoid effusion. Other: None. IMPRESSION: 1. No acute intracranial abnormality. 2. 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 Associated mild right-to-left shift of up to 4 mm. No hydrocephalus or trapping. 3. Age-related cerebral atrophy with mild chronic small vessel ischemic disease. Electronically Signed   By: Jeannine Boga M.D.   On: 10/01/2021 21:17   DG Chest Port 1 View  Result Date: 10/01/2021 CLINICAL DATA:  Anemia. EXAM: PORTABLE CHEST 1 VIEW COMPARISON:  Chest x-ray 02/26/2013. FINDINGS: Cardiomediastinal silhouette is within normal limits for projection. The heart is enlarged, unchanged. Patient is status post TAVR. The lungs and costophrenic angles are clear. There is no pneumothorax. Degenerative changes affect both shoulders. IMPRESSION: 1. No acute cardiopulmonary process. 2. Cardiomegaly. Electronically Signed   By: Ronney Asters M.D.   On: 10/01/2021 21:48    Assessment/Plan  1. Acute blood loss anemia Lab Results  Component Value Date   HGB 7.9 (L) 10/06/2021   -  S/P  transfusion of 2 units PRBC -   Continue ferrous sulfate 325 mg 1 tab twice a day and vitamin C 500 mg twice a day  2. Acute GI bleeding -   GI was consulted and performed EGD on 11/30 which showed nonbleeding esophageal ulcer -    GI recommended to hold off on anticoagulation, follow-up with GI in 1 month -   Continue Protonix 40 mg daily  3. Type 2 diabetes mellitus with neurological complications [facility]) Lab Results  Component Value Date   HGBA1C 7.6 (H) 01/15/2013   -   Will discontinue glipizide -   Continue metformin 1000 mg twice a day and Farxiga 10 mg 1 tab daily -   Continue Neurontin 100 mg 1 capsule at bedtime  4. Essential hypertension -   BPs stable, continue metoprolol succinate ER 50 mg daily, Cardura 8 mg 1 tab in the evening and lisinopril 20 mg daily  5. Chronic atrial fibrillation with RVR ([facility]) -   Rate controlled, continue metoprolol succinate ER 50 mg daily for rate control -   GI recommended to hold off on anticoagulation  6. Coagulopathy ([place]) -   INR was 7, was given vitamin K -    GI recommended to hold off on anticoagulation  7. Physical deconditioning -    For PT and OT, for therapeutic strengthening exercises  8.  Chronic heart failure with preserved ejection fraction -   no SOB<stable -   Continue Lasix 40 mg daily    Family/ staff Communication:   Discussed plan of care with resident and charge nurse.  Labs/tests ordered: For CBC and BMP on 10/16/2021  Goals of care:   Short-term care   Durenda Age, DNP, MSN, FNP-BC [facility] [phone] (Monday-Friday 8:00 a.m. - 5:00 p.m.) [phone] (after hours)

## 2021-10-10 ENCOUNTER — Encounter: Payer: Self-pay | Admitting: Internal Medicine

## 2021-10-10 ENCOUNTER — Non-Acute Institutional Stay (SKILLED_NURSING_FACILITY): Payer: Medicare Other | Admitting: Internal Medicine

## 2021-10-10 DIAGNOSIS — I482 Chronic atrial fibrillation, unspecified: Secondary | ICD-10-CM | POA: Diagnosis not present

## 2021-10-10 DIAGNOSIS — L89303 Pressure ulcer of unspecified buttock, stage 3: Secondary | ICD-10-CM | POA: Diagnosis not present

## 2021-10-10 DIAGNOSIS — E440 Moderate protein-calorie malnutrition: Secondary | ICD-10-CM | POA: Diagnosis not present

## 2021-10-10 DIAGNOSIS — K922 Gastrointestinal hemorrhage, unspecified: Secondary | ICD-10-CM

## 2021-10-10 DIAGNOSIS — D689 Coagulation defect, unspecified: Secondary | ICD-10-CM | POA: Diagnosis not present

## 2021-10-10 DIAGNOSIS — D7589 Other specified diseases of blood and blood-forming organs: Secondary | ICD-10-CM | POA: Diagnosis not present

## 2021-10-10 DIAGNOSIS — R296 Repeated falls: Secondary | ICD-10-CM

## 2021-10-10 NOTE — Patient Instructions (Signed)
See assessment and plan under each diagnosis in the problem list and acutely for this visit 

## 2021-10-10 NOTE — Progress Notes (Signed)
NURSING HOME LOCATION:  [facility] ROOM NUMBER:  [id number]  CODE STATUS:  Full Code  PCP:  Elon Alas MD  This is a comprehensive admission note to this SNFperformed on this date less than 30 days from date of admission. Included are preadmission medical/surgical history; reconciled medication list; family history; social history and comprehensive review of systems.  Corrections and additions to the records were documented. Comprehensive physical exam was also performed. Additionally a clinical summary was entered for each active diagnosis pertinent to this admission in the Problem List to enhance continuity of care.  HPI: He was hospitalized 11/28 - 10/08/2021 with acute GI bleed. He had gone to see his PCP for a "wellness check" and had mentioned that he was weak & had fallen. Stat blood work was done and severe anemia was documented.  Additional history is obtained that he been having melena for months which he attributed to iron supplement. INR was supratherapeutic at 7.2; H/H was 6.4/19.9 with macrocytic indices of 101.5.  He also exhibited CKD stage IIIa with a creatinine of 1.26 and GFR 56.  He received vitamin K to reverse the supratherapeutic PT/INR and received 2 units packed red blood cells.  After transfusions H/H 8.7/26.  On 11/30 EGD was performed revealing nonbleeding esophageal ulcer.  Hemoglobin remained stable post transfusions. On 12/3 prior to discharge H/H was 7.9/25.4 with an MCV of 102.  B12 was supranormal at 1295.  Folate level 32.7.  Iron panel revealed an iron level 25 and 8% saturation.  Blood pressure control was achieved with change from Coreg to Toprol-XL for ease of administration.  Verapamil was held and Cardura dose was decreased.   He did have some abdominal bloating while hospitalized but refused to take any medication recommended as he thought the medication might contribute to the issue.  He also experienced some dizziness in the context  of rapid ventricular rate.  Dizziness resolved with RVR resolution.  Protein caloric malnutrition was documented with an albumin of 2.9 and total protein of 5.5.   GI recommended holding any anticoagulation at discharge.Xtandi prescribed for history of prostate cancer was also held due to the anemia until recheck could be performed 1 week post discharge. Glucoses while hospitalized ranged from a low of 88 up to 319, the latter was an outlier.  He was receiving sliding scale insulin while hospitalized but home regimen was resumed at discharge. Foam dressing was continued for stage III medial coccygeal pressure injury. He was discharged to the SNF for PT/OT.  Past medical and surgical history: Includes chronic A. fib, history of aortic stenosis, dyslipidemia, history of prostate cancer, and diabetes with CKD. Surgeries or procedures include cardiac valve replacement, ORIF forearm fracture, and EGDs.  Social history: Nondrinker; 44-pack-year history of smoking.  He is retired Immunologist, Engineer, structural, and Event organiser.  He states that the last was his most favorite job.  Family history: Family history non contributory due to advanced age.  There is a strong family history of cancer.   Review of systems: He confirms the severe anemia was an incidental finding despite his severe weakness and months of melena. He states he had actually fallen prior to that office visit for a wellness check.  He denied any definite cardiac or neurologic prodrome  pre fall,stating that he feels he tripped when his cane slipped. He does describe intermittent shortness of breath.  He also describes dysphagia.  Constitutional: No fever, significant weight change  Eyes: No redness, discharge, pain, vision change ENT/mouth: No nasal congestion, purulent discharge, earache, change in hearing, sore throat  Cardiovascular: No chest pain, palpitations, paroxysmal nocturnal dyspnea, claudication, edema   Respiratory: No cough, sputum production, hemoptysis, significant snoring, apnea  Gastrointestinal: No heartburn,  abdominal pain, nausea /vomiting, rectal bleeding,change in bowels Genitourinary: No dysuria, hematuria, pyuria, incontinence, nocturia Musculoskeletal: No joint stiffness, joint swelling Dermatologic: No rash, pruritus, change in appearance of skin Neurologic: No dizziness, headache, syncope, seizures, numbness, tingling Psychiatric: No significant anxiety, depression, insomnia, anorexia Endocrine: No change in hair/skin/nails, excessive thirst, excessive hunger, excessive urination  Hematologic/lymphatic: No significant bruising, lymphadenopathy, abnormal bleeding Allergy/immunology: No itchy/watery eyes, significant sneezing, urticaria, angioedema  Physical exam:  Pertinent or positive findings: He appears his stated age.  There is resolving ecchymosis and a linear hematoma over the right forehead.  Pattern alopecia is present.  Eyebrows are thin.  He has complete dentures.  There is accentuation of the thoracic curvature superiorly.  He has scattered low-grade rhonchi.  Rhythm is irregular but rate is controlled.  Pedal pulses are decreased.  The legs are wrapped.  Stasis hyperpigmentation changes are visible above the wrapping.  There is a patterned hyperpigmentation over the left forearm.  The left forearm is larger than the right forearm.  He has ecchymoses of both hands, greater on the right than the left.  General appearance: Adequately nourished; no acute distress, increased work of breathing is present.   Lymphatic: No lymphadenopathy about the head, neck, axilla. Eyes: No conjunctival inflammation or lid edema is present. There is no scleral icterus. Ears:  External ear exam shows no significant lesions or deformities.   Nose:  External nasal examination shows no deformity or inflammation. Nasal mucosa are pink and moist without lesions, exudates Oral exam: There is no  oropharyngeal erythema or exudate. Neck:  No thyromegaly, masses, tenderness noted.    Heart:  No gallop, murmur, click, rub.  Lungs:  without wheezes, rales, rubs. Abdomen: Bowel sounds are normal.  Abdomen is soft and nontender with no organomegaly, hernias, masses. GU: Deferred  Extremities:  No cyanosis, clubbing, edema. Neurologic exam:  Balance, [place], finger to nose testing could not be completed due to clinical state Skin: Warm & dry w/o tenting.  See clinical summary under each active problem in the Problem List with associated updated therapeutic plan

## 2021-10-11 DIAGNOSIS — E46 Unspecified protein-calorie malnutrition: Secondary | ICD-10-CM | POA: Insufficient documentation

## 2021-10-11 NOTE — Assessment & Plan Note (Signed)
Wound care nurse at SNF will monitor and treat.

## 2021-10-11 NOTE — Assessment & Plan Note (Signed)
Cardiology and GI can reassess the indication for warfarin anticoagulation once H/H stable.

## 2021-10-11 NOTE — Assessment & Plan Note (Signed)
No bleeding dyscrasias reported at [facility].  Continue to monitor.

## 2021-10-11 NOTE — Assessment & Plan Note (Signed)
Adequate rate control at present.  Restarting anticoagulation can be revisited with Cardiology and GI once H/H stable.

## 2021-10-11 NOTE — Assessment & Plan Note (Deleted)
On warfarin for chronic A. fib. Supratherapeutic INR at 7.2 reversed with vitamin K.  Severe anemia 6.4/19.9 in the context of chronic melena.  S/p 2 units PRBC.  Warfarin held at discharge.

## 2021-10-11 NOTE — Assessment & Plan Note (Signed)
Nutrition consult at SNF.  PT/OT as tolerated.

## 2021-10-11 NOTE — Assessment & Plan Note (Signed)
Current albumin 2.9 and total protein 5.5.  Nutritionist will consult at SNF.

## 2021-10-11 NOTE — Assessment & Plan Note (Signed)
No indication for B12 or folate supplementation.

## 2021-10-17 ENCOUNTER — Non-Acute Institutional Stay (SKILLED_NURSING_FACILITY): Payer: Medicare Other | Admitting: Adult Health

## 2021-10-17 ENCOUNTER — Encounter: Payer: Self-pay | Admitting: Adult Health

## 2021-10-17 DIAGNOSIS — I5032 Chronic diastolic (congestive) heart failure: Secondary | ICD-10-CM | POA: Diagnosis not present

## 2021-10-17 DIAGNOSIS — I482 Chronic atrial fibrillation, unspecified: Secondary | ICD-10-CM | POA: Diagnosis not present

## 2021-10-17 DIAGNOSIS — K922 Gastrointestinal hemorrhage, unspecified: Secondary | ICD-10-CM | POA: Diagnosis not present

## 2021-10-17 DIAGNOSIS — R600 Localized edema: Secondary | ICD-10-CM

## 2021-10-17 NOTE — Progress Notes (Signed)
Location:  [facility] Room Number: 650 A Place of Service:  SNF (31) Provider:  Durenda Age, DNP, FNP-BC  Patient Care Team: [name], MD as PCP - General (Family Medicine) Sherren Mocha, MD as Attending Physician (Cardiology) [name], MD (Inactive) as Attending Physician (Cardiothoracic Surgery) [name], MD (Inactive) as Attending Physician (Cardiothoracic Surgery)  Extended Emergency Contact Information Primary Emergency Contact: Everest          [place], [place] [address] [name] of [place] Phone: [phone] Mobile Phone: [phone] Relation: Daughter Secondary Emergency Contact: Tennis Must Address: [address]          [place], [place] [address] [name] of [place] Phone: [phone] Relation: Spouse  Code Status:  FULL CODE  Goals of care: Advanced Directive information Advanced Directives 10/23/2021  Does Patient Have a Medical Advance Directive? No  Type of Advance Directive -  Copy of [place] in Chart? -  Would patient like information on creating a medical advance directive? No - Patient declined  Pre-existing out of facility DNR order (yellow form or pink MOST form) -     Chief Complaint  Patient presents with   Acute Visit    Left hand edema    HPI:  Pt is a 87 y.o. male seen today for an acute visit. He is a short-term care resident of [facility]. He has a PMH of atrial fibrillation, S/P TAVR, diastolic CHF, hypertension, diabetes mellitus, peripheral vascular disease and prostate cancer. Noted to have Left forearm and hand 2+edema. He stated that he used to have a peripheral IV on his left forearm. Forearm is slightly erythematous. Left radial pulse 2+. He denies pain, chills nor fever. He was hospitalized 10/01/21 to 10/08/21 for acute GI bleed. He has atrial fibrillation and was taking an anticoagulant. He is not on  anticoagulant upon discharge from the hospital per GI recommendation.   Past Medical History:  Diagnosis Date   Anemia    LOW PLATELETS OTHER DAY  PER PT   Anticoagulated on Coumadin 12/21/2012   Aortic stenosis    Arthritis    "left wrist; back sometimes" (01/21/2013)   Atrial fibrillation ([place])    [name], DR COOPER   Bradycardia    Exertional shortness of breath    Heart murmur    "I've had it for years; runs in the family on daddy's side" (01/21/2013)   HTN (hypertension)    Hyperlipemia 12/21/2012   Prostate cancer ([place])    "had 40 tx of radiation in 2009" (01/21/2013)   S/P aortic valve replacement with bioprosthetic valve 01/19/2013   Transcatheter Aortic Valve Replacement using 74mm Sapien bioprosthetic tissue valve via transapical approach   Type II diabetes mellitus ([place])    Past Surgical History:  Procedure Laterality Date   BALLOON DILATION N/A 08/02/2020   Procedure: BALLOON DILATION;  Surgeon: [name], MD;  Location: [facility];  Service: Gastroenterology;  Laterality: N/A;   BIOPSY  10/03/2021   Procedure: BIOPSY;  Surgeon: [name], MD;  Location: [facility];  Service: Endoscopy;;   CARDIAC CATHETERIZATION  12/16/2012   CARDIAC VALVE REPLACEMENT  01/19/2013   AVR   CATARACT EXTRACTION W/ INTRAOCULAR LENS  IMPLANT, BILATERAL     ESOPHAGOGASTRODUODENOSCOPY N/A 10/03/2021   Procedure: ESOPHAGOGASTRODUODENOSCOPY (EGD);  Surgeon: [name], MD;  Location: [facility];  Service: Endoscopy;  Laterality: N/A;   ESOPHAGOGASTRODUODENOSCOPY (EGD) WITH PROPOFOL N/A 08/02/2020   Procedure: ESOPHAGOGASTRODUODENOSCOPY (EGD) WITH  PROPOFOL;  Surgeon: [name], MD;  Location: [facility];  Service: Gastroenterology;  Laterality: N/A;   EYE SURGERY     INTRAOPERATIVE TRANSESOPHAGEAL ECHOCARDIOGRAM N/A 01/19/2013   Procedure: INTRAOPERATIVE TRANSESOPHAGEAL ECHOCARDIOGRAM;  Surgeon: [name], MD;  Location: [facility];  Service: Open Heart Surgery;  Laterality: N/A;    ORIF FOREARM FRACTURE Left 1952   "compound fx" (01/21/2013)   TRANSTHORACIC ECHOCARDIOGRAM  09/04/10, 09/07/08    Allergies  Allergen Reactions   Amoxicillin Other (See Comments)    Burning of mouth, tongue and lips.  itchy, burning throat    Outpatient Encounter Medications as of 10/17/2021  Medication Sig   ACCU-CHEK AVIVA PLUS test strip USE TO CHECK BLOOD SUGARS 1 TO 2 TIMES A DAY (Patient not taking: Reported on 10/23/2021)   ACCU-CHEK SOFTCLIX LANCETS lancets daily. use as directed (Patient not taking: Reported on 10/23/2021)   Acetaminophen (TYLENOL ARTHRITIS PAIN PO) Take 650 mg by mouth daily with breakfast.   B Complex Vitamins (B COMPLEX 50) TABS Take 1 tablet by mouth daily with breakfast.   bisacodyl (DULCOLAX) 10 MG suppository Place rectally as needed for moderate constipation. If not relieved by MOM, give 10 mg Bisacodyl suppositiory in 24 hours as needed   cholecalciferol (VITAMIN D3) 25 MCG (1000 UNIT) tablet Take 1,000 Units by mouth 2 (two) times daily.   dapagliflozin propanediol (FARXIGA) 10 MG TABS tablet Take 1 tablet (10 mg total) by mouth daily before breakfast.   enzalutamide (XTANDI) 40 MG capsule Take 160 mg by mouth daily at 2 PM.   ferrous sulfate 325 (65 FE) MG tablet Take 325 mg by mouth See admin instructions. Take one tablet (325 mg) by mouth twice daily - with lunch and supper   furosemide (LASIX) 40 MG tablet Take 1 tablet (40 mg total) by mouth daily.   gabapentin (NEURONTIN) 100 MG capsule Take 1 capsule (100 mg total) by mouth at bedtime.   lisinopril (ZESTRIL) 20 MG tablet Take 1 tablet by mouth once daily   magnesium hydroxide (MILK OF MAGNESIA) 400 MG/5ML suspension Take by mouth daily as needed for mild constipation. If no BM in 3 days, give 30 cc Milk of Magnesium in 24 hours as needed   metFORMIN (GLUCOPHAGE) 1000 MG tablet Take 1 tablet by mouth 2 (two) times daily with breakfast and lunch.   metoprolol succinate (TOPROL-XL) 50 MG 24 hr  tablet Take 1 tablet (50 mg total) by mouth daily. Take with or immediately following a meal.   pantoprazole (PROTONIX) 40 MG tablet Take 40 mg by mouth daily.   rosuvastatin (CRESTOR) 5 MG tablet TAKE 1 TABLET BY MOUTH ONCE DAILY AT  6PM   Selenium 200 MCG CAPS Take 200 mcg by mouth daily with breakfast.   simethicone (MYLICON) 80 MG chewable tablet Chew 1 tablet (80 mg total) by mouth 4 (four) times daily as needed for flatulence.   Sodium Phosphates (RA SALINE ENEMA RE) If not relieved by Biscodyl suppository, give disposable Saline Enema rectally x1 dose 24 hrs as needed   Vitamin A 2400 MCG (8000 UT) TABS Take 2,400 mcg by mouth daily with breakfast.   vitamin B-12 (CYANOCOBALAMIN) 1000 MCG tablet Take 1,000 mcg by mouth daily.   vitamin C (ASCORBIC ACID) 500 MG tablet Take 500 mg by mouth 2 (two) times daily.   zinc gluconate 50 MG tablet Take 50 mg by mouth daily with breakfast.   [DISCONTINUED] doxazosin (CARDURA) 2 MG tablet Take 4 tablets (8 mg total) by  mouth every evening.   [DISCONTINUED] glipiZIDE (GLUCOTROL) 5 MG tablet Take 5 mg by mouth daily.   No facility-administered encounter medications on file as of 10/17/2021.    Review of Systems  GENERAL: No change in appetite, no fatigue, no weight changes, no fever or chills  MOUTH and THROAT: Denies oral discomfort, gingival pain or bleeding RESPIRATORY: no cough, SOB, DOE, wheezing, hemoptysis CARDIAC: No chest pain, edema or palpitations GI: No abdominal pain, diarrhea, constipation, heart burn, nausea or vomiting GU: Denies dysuria, frequency, hematuria or discharge NEUROLOGICAL: Denies dizziness, syncope, numbness, or headache PSYCHIATRIC: Denies feelings of depression or anxiety. No report of hallucinations, insomnia, paranoia, or agitation   Immunization History  Administered Date(s) Administered   Influenza,inj,Quad PF,6+ Mos 07/15/2016, 08/01/2017, 07/24/2018, 08/27/2019, 10/05/2020, 07/12/2021   Influenza,inj,quad,  With Preservative 07/21/2014, 08/14/2015   Influenza-Unspecified 07/05/2012   Janssen (J&J) SARS-COV-2 Vaccination 02/13/2020, 09/19/2020   Pneumococcal Conjugate PCV 7 08/04/2017   Pneumococcal Conjugate-13 04/28/2014   Pneumococcal Polysaccharide-23 09/03/2010   Tdap 02/05/2016   Pertinent  Health Maintenance Due  Topic Date Due   OPHTHALMOLOGY EXAM  Never done   HEMOGLOBIN A1C  07/18/2013   FOOT EXAM  03/09/2022   INFLUENZA VACCINE  Completed   Fall Risk 10/05/2021 10/05/2021 10/06/2021 10/07/2021 10/08/2021  Falls in the past year? - - - - -  Was there an injury with Fall? - - - - -  Fall Risk Category Calculator - - - - -  Fall Risk Category - - - - -  Patient Fall Risk Level High fall risk High fall risk High fall risk High fall risk High fall risk     Vitals:   10/17/21 1115  BP: 125/65  Pulse: 70  Resp: 20  Temp: (!) 97.5 F (36.4 C)  Weight: 168 lb 6.4 oz (76.4 kg)  Height: 5\' 8"  (1.727 m)   Body mass index is 25.61 kg/m.  Physical Exam  GENERAL APPEARANCE: Well nourished. In no acute distress. Normal body habitus SKIN:  Left forearm with slight erythema, not warm to touch, 2+edema MOUTH and THROAT: Lips are without lesions. Oral mucosa is moist and without lesions.  RESPIRATORY: Breathing is even & unlabored, BS CTAB CARDIAC: Irregular heart rhythm, no murmur,no extra heart sounds, LUE 2+ and BLE 2+ edema GI: Abdomen soft, normal BS, no masses, no tenderness, NEUROLOGICAL: There is no tremor. Speech is clear. Alert and oriented X 3. PSYCHIATRIC:  Affect and behavior are appropriate  Labs reviewed: Recent Labs    11/22/20 1410 04/04/21 0918 10/01/21 2019 10/04/21 0453 10/05/21 0917 10/06/21 1041 10/19/21 0000  NA 140 143   < > 136 134* 132* 139  K 4.6 5.2   < > 4.2 4.6 3.4* 4.7  CL 102 101   < > 111 110 106 104  CO2 23 25   < > 19* 16* 19* 21  GLUCOSE 141* 195*   < > 220* 201* 324*  --   BUN 41* 46*   < > 55* 46* 38* 34*  CREATININE 1.18 1.38*   < >  0.98 0.98 1.07 0.9  CALCIUM 9.3 9.7   < > 8.2* 8.3* 8.0* 8.8  MG 2.1 2.3  --   --   --  2.2  --    < > = values in this interval not displayed.   Recent Labs    10/02/21 0613 10/05/21 0917  AST 23 25  ALT 17 19  ALKPHOS 44 51  BILITOT 1.2 1.0  PROT  5.5* 5.0*  ALBUMIN 2.9* 2.6*   Recent Labs    10/02/21 0613 10/03/21 0514 10/04/21 1247 10/05/21 1155 10/06/21 1041 10/19/21 0000  WBC 7.3   < > 11.9* 9.6 7.5 5.4  NEUTROABS 5.6  --   --   --   --   --   HGB 7.6*   < > 8.2* 8.8* 7.9* 8.7*  HCT 23.2*   < > 26.6* 28.2* 25.4* 27*  MCV 97.5   < > 102.7* 102.5* 102.0*  --   PLT 214   < > 249 196 220 181   < > = values in this interval not displayed.   No results found for: TSH Lab Results  Component Value Date   HGBA1C 7.6 (H) 01/15/2013   Lab Results  Component Value Date   CHOL 81 (L) 08/17/2018   HDL 28 (L) 08/17/2018   LDLCALC 37 08/17/2018   TRIG 82 08/17/2018   CHOLHDL 2.9 08/17/2018    Significant Diagnostic Results in last 30 days:  DG Pelvis 1-2 Views  Result Date: 10/02/2021 CLINICAL DATA:  Weakness fiducial markers EXAM: PELVIS - 1-2 VIEW COMPARISON:  None. FINDINGS: There is no evidence of pelvic fracture or diastasis. No pelvic bone lesions are seen. Noted in the prostate gland. IMPRESSION: Negative. Electronically Signed   By: Misty Stanley M.D.   On: 10/02/2021 07:26   CT Head Wo Contrast  Result Date: 10/01/2021 CLINICAL DATA:  Initial evaluation for acute headache, fall. EXAM: CT HEAD WITHOUT CONTRAST TECHNIQUE: Contiguous axial images were obtained from the base of the skull through the vertex without intravenous contrast. COMPARISON:  None available. FINDINGS: Brain: Diffuse prominence of the CSF containing spaces compatible generalized age-related cerebral atrophy. Mild chronic microvascular ischemic changes noted involving the supraorbital cerebral white matter. No acute intracranial hemorrhage. No acute large vessel territory infarct. No mass lesion.  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 Associated mild right-to-left shift of up to 4 mm. No hydrocephalus or trapping. Vascular: No hyperdense vessel. Scattered vascular calcifications noted within the carotid siphons. Skull: Scalp soft tissues and calvarium demonstrate no acute finding. Scattered vascular calcifications noted about the scalp. Sinuses/Orbits: Globes and orbital soft tissues demonstrate no acute finding. Paranasal sinuses are largely clear. No significant mastoid effusion. Other: None. IMPRESSION: 1. No acute intracranial abnormality. 2. 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 Associated mild right-to-left shift of up to 4 mm. No hydrocephalus or trapping. 3. Age-related cerebral atrophy with mild chronic small vessel ischemic disease. Electronically Signed   By: Jeannine Boga M.D.   On: 10/01/2021 21:17   DG Chest Port 1 View  Result Date: 10/01/2021 CLINICAL DATA:  Anemia. EXAM: PORTABLE CHEST 1 VIEW COMPARISON:  Chest x-ray 02/26/2013. FINDINGS: Cardiomediastinal silhouette is within normal limits for projection. The heart is enlarged, unchanged. Patient is status post TAVR. The lungs and costophrenic angles are clear. There is no pneumothorax. Degenerative changes affect both shoulders. IMPRESSION: 1. No acute cardiopulmonary process. 2. Cardiomegaly. Electronically Signed   By: Ronney Asters M.D.   On: 10/01/2021 21:48    Assessment/Plan  1. Edema of left upper extremity -  had peripheral IV on the Left forearm -   anticoagulant on hold due to GI bleed -  Continue furosemide 40 mg 1 tab daily -   will order for venous ultrasound of LUE to rule out DVT -  CBC with differentials to rule out infection  2. Acute GI bleeding -    Continue to hold anticoagulant -   no reported bloody stool -   Continue pantoprazole 40 mg  1 tab daily  3. Chronic atrial fibrillation with RVR (HCC) -   Rate controlled, continue metoprolol succinate ER 50 mg daily  4. Chronic heart failure with preserved ejection fraction (HCC) -    Continue furosemide 40 mg 1 tab daily  -    Check BMP with GFR    Family/ staff Communication:   Discussed plan of care with resident and charge nurse.  Labs/tests ordered:   CBC with differentials, BMP with GFR and venous ultrasound of LUE  Goals of care:   Short-term care   Durenda Age, DNP, MSN, FNP-BC [facility] [phone] (Monday-Friday 8:00 a.m. - 5:00 p.m.) [phone] (after hours)

## 2021-10-19 DIAGNOSIS — Z79899 Other long term (current) drug therapy: Secondary | ICD-10-CM | POA: Diagnosis not present

## 2021-10-19 LAB — COMPREHENSIVE METABOLIC PANEL: Calcium: 8.8 (ref 8.7–10.7)

## 2021-10-19 LAB — BASIC METABOLIC PANEL
BUN: 34 — AB (ref 4–21)
CO2: 21 (ref 13–22)
Chloride: 104 (ref 99–108)
Creatinine: 0.9 (ref 0.6–1.3)
Glucose: 168
Potassium: 4.7 (ref 3.4–5.3)
Sodium: 139 (ref 137–147)

## 2021-10-19 LAB — CBC AND DIFFERENTIAL
HCT: 27 — AB (ref 41–53)
Hemoglobin: 8.7 — AB (ref 13.5–17.5)
Platelets: 181 (ref 150–399)
WBC: 5.4

## 2021-10-19 LAB — CBC: RBC: 2.71 — AB (ref 3.87–5.11)

## 2021-10-21 DIAGNOSIS — R2681 Unsteadiness on feet: Secondary | ICD-10-CM | POA: Diagnosis not present

## 2021-10-21 DIAGNOSIS — M6281 Muscle weakness (generalized): Secondary | ICD-10-CM | POA: Diagnosis not present

## 2021-10-21 DIAGNOSIS — R1312 Dysphagia, oropharyngeal phase: Secondary | ICD-10-CM | POA: Diagnosis not present

## 2021-10-22 DIAGNOSIS — M6281 Muscle weakness (generalized): Secondary | ICD-10-CM | POA: Diagnosis not present

## 2021-10-22 DIAGNOSIS — R1312 Dysphagia, oropharyngeal phase: Secondary | ICD-10-CM | POA: Diagnosis not present

## 2021-10-22 DIAGNOSIS — R2681 Unsteadiness on feet: Secondary | ICD-10-CM | POA: Diagnosis not present

## 2021-10-23 ENCOUNTER — Other Ambulatory Visit: Payer: Self-pay | Admitting: Adult Health

## 2021-10-23 ENCOUNTER — Encounter: Payer: Self-pay | Admitting: Adult Health

## 2021-10-23 ENCOUNTER — Non-Acute Institutional Stay (SKILLED_NURSING_FACILITY): Payer: Medicare Other | Admitting: Adult Health

## 2021-10-23 DIAGNOSIS — I10 Essential (primary) hypertension: Secondary | ICD-10-CM | POA: Diagnosis not present

## 2021-10-23 DIAGNOSIS — C61 Malignant neoplasm of prostate: Secondary | ICD-10-CM

## 2021-10-23 DIAGNOSIS — D62 Acute posthemorrhagic anemia: Secondary | ICD-10-CM | POA: Diagnosis not present

## 2021-10-23 DIAGNOSIS — L89152 Pressure ulcer of sacral region, stage 2: Secondary | ICD-10-CM

## 2021-10-23 DIAGNOSIS — K922 Gastrointestinal hemorrhage, unspecified: Secondary | ICD-10-CM | POA: Diagnosis not present

## 2021-10-23 DIAGNOSIS — E782 Mixed hyperlipidemia: Secondary | ICD-10-CM | POA: Diagnosis not present

## 2021-10-23 DIAGNOSIS — I482 Chronic atrial fibrillation, unspecified: Secondary | ICD-10-CM

## 2021-10-23 DIAGNOSIS — R5381 Other malaise: Secondary | ICD-10-CM

## 2021-10-23 DIAGNOSIS — E1149 Type 2 diabetes mellitus with other diabetic neurological complication: Secondary | ICD-10-CM | POA: Diagnosis not present

## 2021-10-23 DIAGNOSIS — I5032 Chronic diastolic (congestive) heart failure: Secondary | ICD-10-CM

## 2021-10-23 MED ORDER — LISINOPRIL 20 MG PO TABS: 20.0000 mg | ORAL_TABLET | Freq: Every day | ORAL | 0 refills | Status: DC

## 2021-10-23 MED ORDER — DAPAGLIFLOZIN PROPANEDIOL 10 MG PO TABS: 10.0000 mg | ORAL_TABLET | Freq: Every day | ORAL | 0 refills | Status: DC

## 2021-10-23 MED ORDER — METOPROLOL SUCCINATE ER 50 MG PO TB24
50.0000 mg | ORAL_TABLET | Freq: Every day | ORAL | 0 refills | Status: DC
Start: 2021-10-23 — End: 2021-12-18

## 2021-10-23 MED ORDER — GABAPENTIN 100 MG PO CAPS: 100.0000 mg | ORAL_CAPSULE | Freq: Every day | ORAL | 0 refills | Status: DC

## 2021-10-23 MED ORDER — METFORMIN HCL 1000 MG PO TABS: 1000.0000 mg | ORAL_TABLET | Freq: Two times a day (BID) | ORAL | 0 refills | Status: DC

## 2021-10-23 MED ORDER — DOXAZOSIN MESYLATE 8 MG PO TABS: 8.0000 mg | ORAL_TABLET | Freq: Every day | ORAL | 0 refills | Status: DC

## 2021-10-23 MED ORDER — PANTOPRAZOLE SODIUM 40 MG PO TBEC
40.0000 mg | DELAYED_RELEASE_TABLET | Freq: Every day | ORAL | 0 refills | Status: DC
Start: 2021-10-23 — End: 2021-12-18

## 2021-10-23 MED ORDER — XTANDI 40 MG PO CAPS
160.0000 mg | ORAL_CAPSULE | Freq: Every day | ORAL | 0 refills | Status: DC
Start: 2021-10-23 — End: 2022-01-01

## 2021-10-23 MED ORDER — FUROSEMIDE 40 MG PO TABS: 40.0000 mg | ORAL_TABLET | Freq: Every day | ORAL | 0 refills | Status: DC

## 2021-10-23 MED ORDER — ROSUVASTATIN CALCIUM 5 MG PO TABS: ORAL_TABLET | ORAL | 0 refills | Status: DC

## 2021-10-23 NOTE — Progress Notes (Signed)
Location:  [facility] Room Number: 025-K Place of Service:  SNF (31) Provider:  Durenda Age, DNP, FNP-BC  Patient Care Team: [name], MD as PCP - General (Family Medicine) Sherren Mocha, MD as Attending Physician (Cardiology) [name], MD (Inactive) as Attending Physician (Cardiothoracic Surgery) [name], MD (Inactive) as Attending Physician (Cardiothoracic Surgery)  Extended Emergency Contact Information Primary Emergency Contact: [place]          [place], [place] [address] [name] of [place] Phone: [phone] Mobile Phone: [phone] Relation: Daughter Secondary Emergency Contact: Tennis Must Address: [address]          [place],  [address] [name] of [place] Phone: [phone] Relation: Spouse  Code Status:  Full Code   Goals of care: Advanced Directive information Advanced Directives 10/23/2021  Does Patient Have a Medical Advance Directive? No  Type of Advance Directive -  Copy of [place] in Chart? -  Would patient like information on creating a medical advance directive? No - Patient declined  Pre-existing out of facility DNR order (yellow form or pink MOST form) -     Chief Complaint  Patient presents with   Discharge Note    Discharge from [facility]     HPI:  Pt is a 87 y.o. male who is for discharge home on 10/24/21 with Home health PT, OT and Nurse.  He was admitted to [facility] on 10/08/2021 post hospital mission 10/01/2021 to 10/08/2021.  He has a medical history significant for atrial fibrillation, S/P TAVR, diastolic CHF, hypertension, diabetes mellitus, peripheral vascular disease and prostate cancer.  He had a routine follow-up with his primary care physician and had blood work done which showed significant fall in his hemoglobin.  He was advised to go to ED. he had a fall at home a week  before hospitalization sustaining a small bruise on his shoulder and skin tears to both lower extremities/shins.  He denies noticing bloody stools nor abdominal pain.  In the ED, his stool was positive for occult blood with hemoglobin 6.4, which is a drop from 11.9 in May 2022.  He was initially hypotensive and improved with fluid bolus and transfusion of 2 units PRBC.  INR was 7 so vitamin K was given.  GI was consulted and EGD was performed on 11/30 which showed nonbleeding esophageal ulcer.  GI recommended to hold off on anticoagulation upon discharge.  Patient was admitted to this facility for short-term rehabilitation after the patient's recent hospitalization.  Patient has completed SNF rehabilitation and therapy has cleared the patient for discharge.    Past Medical History:  Diagnosis Date   Anemia    LOW PLATELETS OTHER DAY  PER PT   Anticoagulated on Coumadin 12/21/2012   Aortic stenosis    Arthritis    "left wrist; back sometimes" (01/21/2013)   Atrial fibrillation ([place])    [name], DR COOPER   Bradycardia    Exertional shortness of breath    Heart murmur    "I've had it for years; runs in the family on daddy's side" (01/21/2013)   HTN (hypertension)    Hyperlipemia 12/21/2012   Prostate cancer ([place])    "had 40 tx of radiation in 2009" (01/21/2013)   S/P aortic valve replacement with bioprosthetic valve 01/19/2013   Transcatheter Aortic Valve Replacement using 46mm Sapien bioprosthetic tissue valve via transapical approach   Type II diabetes mellitus ([place])    Past  Surgical History:  Procedure Laterality Date   BALLOON DILATION N/A 08/02/2020   Procedure: BALLOON DILATION;  Surgeon: [name], MD;  Location: [facility];  Service: Gastroenterology;  Laterality: N/A;   BIOPSY  10/03/2021   Procedure: BIOPSY;  Surgeon: [name], MD;  Location: [facility];  Service: Endoscopy;;   CARDIAC CATHETERIZATION  12/16/2012   CARDIAC VALVE REPLACEMENT  01/19/2013   AVR    CATARACT EXTRACTION W/ INTRAOCULAR LENS  IMPLANT, BILATERAL     ESOPHAGOGASTRODUODENOSCOPY N/A 10/03/2021   Procedure: ESOPHAGOGASTRODUODENOSCOPY (EGD);  Surgeon: [name], MD;  Location: [facility];  Service: Endoscopy;  Laterality: N/A;   ESOPHAGOGASTRODUODENOSCOPY (EGD) WITH PROPOFOL N/A 08/02/2020   Procedure: ESOPHAGOGASTRODUODENOSCOPY (EGD) WITH PROPOFOL;  Surgeon: [name], MD;  Location: [facility];  Service: Gastroenterology;  Laterality: N/A;   EYE SURGERY     INTRAOPERATIVE TRANSESOPHAGEAL ECHOCARDIOGRAM N/A 01/19/2013   Procedure: INTRAOPERATIVE TRANSESOPHAGEAL ECHOCARDIOGRAM;  Surgeon: [name], MD;  Location: [facility];  Service: Open Heart Surgery;  Laterality: N/A;   ORIF FOREARM FRACTURE Left 1954   "compound fx" (01/21/2013)   TRANSTHORACIC ECHOCARDIOGRAM  09/04/10, 09/07/08    Allergies  Allergen Reactions   Amoxicillin Other (See Comments)    Burning of mouth, tongue and lips.  itchy, burning throat    Outpatient Encounter Medications as of 10/23/2021  Medication Sig   Acetaminophen (TYLENOL ARTHRITIS PAIN PO) Take 650 mg by mouth daily with breakfast.   Amino Acids-Protein Hydrolys (FEEDING SUPPLEMENT, PRO-STAT SUGAR FREE 64,) LIQD Take 30 mLs by mouth 2 (two) times daily.   B Complex Vitamins (B COMPLEX 50) TABS Take 1 tablet by mouth daily with breakfast.   bisacodyl (DULCOLAX) 10 MG suppository Place rectally as needed for moderate constipation. If not relieved by MOM, give 10 mg Bisacodyl suppositiory in 24 hours as needed   cholecalciferol (VITAMIN D3) 25 MCG (1000 UNIT) tablet Take 1,000 Units by mouth 2 (two) times daily.   dapagliflozin propanediol (FARXIGA) 10 MG TABS tablet Take 1 tablet (10 mg total) by mouth daily before breakfast.   doxazosin (CARDURA) 8 MG tablet Take 8 mg by mouth daily.   enzalutamide (XTANDI) 40 MG capsule Take 160 mg by mouth daily at 2 PM.   ferrous sulfate 325 (65 FE) MG tablet Take 325 mg by mouth See admin  instructions. Take one tablet (325 mg) by mouth twice daily - with lunch and supper   furosemide (LASIX) 40 MG tablet Take 1 tablet (40 mg total) by mouth daily.   gabapentin (NEURONTIN) 100 MG capsule Take 1 capsule (100 mg total) by mouth at bedtime.   lisinopril (ZESTRIL) 20 MG tablet Take 1 tablet by mouth once daily   magnesium hydroxide (MILK OF MAGNESIA) 400 MG/5ML suspension Take by mouth daily as needed for mild constipation. If no BM in 3 days, give 30 cc Milk of Magnesium in 24 hours as needed   metFORMIN (GLUCOPHAGE) 1000 MG tablet Take 1 tablet by mouth 2 (two) times daily with breakfast and lunch.   metoprolol succinate (TOPROL-XL) 50 MG 24 hr tablet Take 1 tablet (50 mg total) by mouth daily. Take with or immediately following a meal.   pantoprazole (PROTONIX) 40 MG tablet Take 40 mg by mouth daily.   rosuvastatin (CRESTOR) 5 MG tablet TAKE 1 TABLET BY MOUTH ONCE DAILY AT  6PM   Selenium 200 MCG CAPS Take 200 mcg by mouth daily with breakfast.   simethicone (MYLICON) 80 MG chewable tablet Chew 1 tablet (80 mg total)  by mouth 4 (four) times daily as needed for flatulence.   Sodium Phosphates (RA SALINE ENEMA RE) If not relieved by Biscodyl suppository, give disposable Saline Enema rectally x1 dose 24 hrs as needed   Vitamin A 2400 MCG (8000 UT) TABS Take 2,400 mcg by mouth daily with breakfast.   vitamin B-12 (CYANOCOBALAMIN) 1000 MCG tablet Take 1,000 mcg by mouth daily.   vitamin C (ASCORBIC ACID) 500 MG tablet Take 500 mg by mouth 2 (two) times daily.   zinc gluconate 50 MG tablet Take 50 mg by mouth daily with breakfast.   ACCU-CHEK AVIVA PLUS test strip USE TO CHECK BLOOD SUGARS 1 TO 2 TIMES A DAY (Patient not taking: Reported on 10/23/2021)   ACCU-CHEK SOFTCLIX LANCETS lancets daily. use as directed (Patient not taking: Reported on 10/23/2021)   [DISCONTINUED] doxazosin (CARDURA) 2 MG tablet Take 4 tablets (8 mg total) by mouth every evening.   No facility-administered  encounter medications on file as of 10/23/2021.    Review of Systems  GENERAL: No change in appetite, no fatigue, no weight changes, no fever orchills  MOUTH and THROAT: Denies oral discomfort, gingival pain or bleeding, RESPIRATORY: no cough, SOB, DOE, wheezing, hemoptysis CARDIAC: No chest pain or palpitations GI: No abdominal pain, diarrhea, constipation, heart burn, nausea or vomiting GU: Denies dysuria, frequency, hematuria or discharge NEUROLOGICAL: Denies dizziness, syncope, numbness, or headache PSYCHIATRIC: Denies feelings of depression or anxiety. No report of hallucinations, insomnia, paranoia, or agitation    Immunization History  Administered Date(s) Administered   Influenza,inj,Quad PF,6+ Mos 07/15/2016, 08/01/2017, 07/24/2018, 08/27/2019, 10/05/2020, 07/12/2021   Influenza,inj,quad, With Preservative 07/21/2014, 08/14/2015   Influenza-Unspecified 07/05/2012   Janssen (J&J) SARS-COV-2 Vaccination 02/13/2020, 09/19/2020   Pneumococcal Conjugate PCV 7 08/04/2017   Pneumococcal Conjugate-13 04/28/2014   Pneumococcal Polysaccharide-23 09/03/2010   Tdap 02/05/2016   Pertinent  Health Maintenance Due  Topic Date Due   OPHTHALMOLOGY EXAM  Never done   HEMOGLOBIN A1C  07/18/2013   FOOT EXAM  03/09/2022   INFLUENZA VACCINE  Completed   Fall Risk 10/05/2021 10/05/2021 10/06/2021 10/07/2021 10/08/2021  Falls in the past year? - - - - -  Was there an injury with Fall? - - - - -  Fall Risk Category Calculator - - - - -  Fall Risk Category - - - - -  Patient Fall Risk Level High fall risk High fall risk High fall risk High fall risk High fall risk     Vitals:   10/23/21 1038  BP: 123/66  Pulse: 87  Resp: 18  Temp: (!) 97.3 F (36.3 C)  SpO2: 97%  Weight: 168 lb 6.4 oz (76.4 kg)  Height: 5\' 8"  (1.727 m)   Body mass index is 25.61 kg/m.  Physical Exam  GENERAL APPEARANCE: Well nourished. In no acute distress. Normal body habitus SKIN:  RLE/shin venous wound with  pink wound and no drainage -  LLE venous ulcer to times 2 x 0.2 cm with pink wound base and scant serous drainage -   Sacral pressure stage II measuring 0.4 x 0.3 cm with pink wound base MOUTH and THROAT: Lips are without lesions. Oral mucosa is moist and without lesions.  RESPIRATORY: Breathing is even & unlabored, BS CTAB CARDIAC: RRR, no murmur,no extra heart sounds, Left hand 1+edema, RLE and LLE 1+edema  GI: Abdomen soft, normal BS, no masses, no tenderness NEUROLOGICAL: There is no tremor. Speech is clear. Alert and oriented X 3.  PSYCHIATRIC: Affect and behavior are appropriate  Labs reviewed: Recent Labs    11/22/20 1410 04/04/21 0918 10/01/21 2019 10/04/21 0453 10/05/21 0917 10/06/21 1041 10/19/21 0000  NA 140 143   < > 136 134* 132* 139  K 4.6 5.2   < > 4.2 4.6 3.4* 4.7  CL 102 101   < > 111 110 106 104  CO2 23 25   < > 19* 16* 19* 21  GLUCOSE 141* 195*   < > 220* 201* 324*  --   BUN 41* 46*   < > 55* 46* 38* 34*  CREATININE 1.18 1.38*   < > 0.98 0.98 1.07 0.9  CALCIUM 9.3 9.7   < > 8.2* 8.3* 8.0* 8.8  MG 2.1 2.3  --   --   --  2.2  --    < > = values in this interval not displayed.   Recent Labs    10/02/21 0613 10/05/21 0917  AST 23 25  ALT 17 19  ALKPHOS 44 51  BILITOT 1.2 1.0  PROT 5.5* 5.0*  ALBUMIN 2.9* 2.6*   Recent Labs    10/02/21 0613 10/03/21 0514 10/04/21 1247 10/05/21 1155 10/06/21 1041 10/19/21 0000  WBC 7.3   < > 11.9* 9.6 7.5 5.4  NEUTROABS 5.6  --   --   --   --   --   HGB 7.6*   < > 8.2* 8.8* 7.9* 8.7*  HCT 23.2*   < > 26.6* 28.2* 25.4* 27*  MCV 97.5   < > 102.7* 102.5* 102.0*  --   PLT 214   < > 249 196 220 181   < > = values in this interval not displayed.   No results found for: TSH Lab Results  Component Value Date   HGBA1C 7.6 (H) 01/15/2013   Lab Results  Component Value Date   CHOL 81 (L) 08/17/2018   HDL 28 (L) 08/17/2018   LDLCALC 37 08/17/2018   TRIG 82 08/17/2018   CHOLHDL 2.9 08/17/2018    Significant  Diagnostic Results in last 30 days:  DG Pelvis 1-2 Views  Result Date: 10/02/2021 CLINICAL DATA:  Weakness fiducial markers EXAM: PELVIS - 1-2 VIEW COMPARISON:  None. FINDINGS: There is no evidence of pelvic fracture or diastasis. No pelvic bone lesions are seen. Noted in the prostate gland. IMPRESSION: Negative. Electronically Signed   By: Misty Stanley M.D.   On: 10/02/2021 07:26   CT Head Wo Contrast  Result Date: 10/01/2021 CLINICAL DATA:  Initial evaluation for acute headache, fall. EXAM: CT HEAD WITHOUT CONTRAST TECHNIQUE: Contiguous axial images were obtained from the base of the skull through the vertex without intravenous contrast. COMPARISON:  None available. FINDINGS: Brain: Diffuse prominence of the CSF containing spaces compatible generalized age-related cerebral atrophy. Mild chronic microvascular ischemic changes noted involving the supraorbital cerebral white matter. No acute intracranial hemorrhage. No acute large vessel territory infarct. No mass lesion. 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 Associated mild right-to-left shift of up to 4 mm. No hydrocephalus or trapping. Vascular: No hyperdense vessel. Scattered vascular calcifications noted within the carotid siphons. Skull: Scalp soft tissues and calvarium demonstrate no acute finding. Scattered vascular calcifications noted about the scalp. Sinuses/Orbits: Globes and orbital soft tissues demonstrate no acute finding. Paranasal sinuses are largely clear. No significant mastoid effusion. Other: None. IMPRESSION: 1. No acute intracranial abnormality. 2. 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 Associated mild right-to-left shift of up to 4 mm. No hydrocephalus or trapping. 3. Age-related cerebral atrophy with mild chronic small vessel ischemic disease. Electronically Signed   By: Jeannine Boga M.D.   On: 10/01/2021 21:17   DG Chest Port 1 View  Result Date: 10/01/2021 CLINICAL DATA:  Anemia. EXAM: PORTABLE CHEST 1 VIEW COMPARISON:  Chest x-ray 02/26/2013. FINDINGS: Cardiomediastinal silhouette is within normal limits for projection. The heart is enlarged, unchanged. Patient is status post TAVR. The lungs and costophrenic angles are clear. There is no pneumothorax. Degenerative changes affect both shoulders. IMPRESSION: 1. No acute cardiopulmonary process. 2. Cardiomegaly. Electronically Signed   By: Ronney Asters M.D.   On: 10/01/2021 21:48    Assessment/Plan  1. Acute GI bleeding -   GI was consulted and performed EGD on 11/30 which showed nonbleeding esophageal ulcer -    GI recommended to hold off on anticoagulation, follow-up with GI  - pantoprazole (PROTONIX) 40 MG tablet; Take 1 tablet (40 mg total) by mouth daily.  Dispense: 30 tablet; Refill: 0  2. Acute blood loss anemia Lab Results  Component Value Date   WBC 5.4 10/19/2021   HGB 8.7 (A) 10/19/2021   HCT 27 (A) 10/19/2021   MCV 102.0 (H) 10/06/2021   PLT 181 10/19/2021   -   Continue ferrous sulfate 325 mg 1 tab PO at lunch and supper  3. Chronic heart failure with preserved ejection fraction (HCC) - furosemide (LASIX) 40 MG tablet; Take 1 tablet (40 mg total) by mouth daily.  Dispense: 30 tablet; Refill: 0  4. Chronic atrial fibrillation with RVR (HCC) -    GI recommended to hold off on anticoagulant  - metoprolol succinate (TOPROL-XL) 50 MG 24 hr tablet; Take 1 tablet (50 mg total) by mouth daily. Take with or immediately following a meal.  Dispense: 30 tablet; Refill: 0  5. Pressure injury of sacral region, stage 2 ([place]) -   Will be followed up by home health nurse for wound care -    Cleanse sacrum with NS, apply foam dressing, and change every other day -    Has bilateral lower leg venous ulcers with treatments below:        Cleanse right lower anterior leg venous wound with NS, apply foam  dressing and change every other day         Cleanse left lower anterior leg venous ulcer with NS, apply Xeroform, wrap with Kerlix and change every other day  6. Prostate cancer ([place]) - doxazosin (CARDURA) 8 MG tablet; Take 1 tablet (8 mg total) by mouth daily.  Dispense: 30 tablet; Refill: 0 - enzalutamide (XTANDI) 40 MG capsule; Take 4 capsules (160 mg total) by mouth daily at 2 PM.  Dispense: 120 capsule; Refill: 0  7. Essential hypertension - lisinopril (ZESTRIL) 20 MG tablet; Take 1 tablet (20 mg total) by mouth daily.  Dispense: 30 tablet; Refill: 0  8. Type 2 diabetes mellitus with neurological complications (HCC) - dapagliflozin propanediol (FARXIGA) 10 MG TABS tablet; Take 1 tablet (10 mg total) by mouth daily before breakfast.  Dispense: 30 tablet; Refill: 0 - gabapentin (NEURONTIN) 100 MG capsule; Take 1 capsule (100 mg total) by mouth at bedtime.  Dispense: 30 capsule; Refill: 0 - metFORMIN (GLUCOPHAGE) 1000 MG tablet; Take 1 tablet (1,000 mg total) by mouth 2 (two) times daily with breakfast and lunch.  Dispense: 60 tablet; Refill: 0  9. Mixed hyperlipidemia - rosuvastatin (CRESTOR) 5 MG tablet; TAKE 1 TABLET  BY MOUTH ONCE DAILY AT  6PM  Dispense: 30 tablet; Refill: 0  10. Physical deconditioning -    For home health PT and OT, for therapeutic strengthening exercises      I have filled out patient's discharge paperwork and e-prescribed medications.  Patient will have home health PT, OT and Nursing.  DME provided:  None  Total discharge time: Greater than 30 minutes  Greater than 50% was spent in counseling and coordination of care.   Discharge time involved coordination of the discharge process with social worker, nursing staff and therapy department. Medical justification for home health services verified.       Durenda Age, DNP, MSN, FNP-BC [facility] [phone] (Monday-Friday 8:00 a.m. - 5:00 p.m.) [phone] (after  hours)

## 2021-10-26 DIAGNOSIS — E1149 Type 2 diabetes mellitus with other diabetic neurological complication: Secondary | ICD-10-CM | POA: Diagnosis not present

## 2021-10-26 DIAGNOSIS — K2210 Ulcer of esophagus without bleeding: Secondary | ICD-10-CM | POA: Diagnosis not present

## 2021-10-26 DIAGNOSIS — M160 Bilateral primary osteoarthritis of hip: Secondary | ICD-10-CM | POA: Diagnosis not present

## 2021-10-26 DIAGNOSIS — D62 Acute posthemorrhagic anemia: Secondary | ICD-10-CM | POA: Diagnosis not present

## 2021-10-26 DIAGNOSIS — Z953 Presence of xenogenic heart valve: Secondary | ICD-10-CM | POA: Diagnosis not present

## 2021-10-26 DIAGNOSIS — Z7984 Long term (current) use of oral hypoglycemic drugs: Secondary | ICD-10-CM | POA: Diagnosis not present

## 2021-10-26 DIAGNOSIS — I482 Chronic atrial fibrillation, unspecified: Secondary | ICD-10-CM | POA: Diagnosis not present

## 2021-10-26 DIAGNOSIS — Z9181 History of falling: Secondary | ICD-10-CM | POA: Diagnosis not present

## 2021-10-26 DIAGNOSIS — D689 Coagulation defect, unspecified: Secondary | ICD-10-CM | POA: Diagnosis not present

## 2021-10-26 DIAGNOSIS — E782 Mixed hyperlipidemia: Secondary | ICD-10-CM | POA: Diagnosis not present

## 2021-10-26 DIAGNOSIS — M19032 Primary osteoarthritis, left wrist: Secondary | ICD-10-CM | POA: Diagnosis not present

## 2021-10-26 DIAGNOSIS — E1151 Type 2 diabetes mellitus with diabetic peripheral angiopathy without gangrene: Secondary | ICD-10-CM | POA: Diagnosis not present

## 2021-10-26 DIAGNOSIS — M1710 Unilateral primary osteoarthritis, unspecified knee: Secondary | ICD-10-CM | POA: Diagnosis not present

## 2021-10-26 DIAGNOSIS — I872 Venous insufficiency (chronic) (peripheral): Secondary | ICD-10-CM | POA: Diagnosis not present

## 2021-10-26 DIAGNOSIS — M19012 Primary osteoarthritis, left shoulder: Secondary | ICD-10-CM | POA: Diagnosis not present

## 2021-10-26 DIAGNOSIS — I5032 Chronic diastolic (congestive) heart failure: Secondary | ICD-10-CM | POA: Diagnosis not present

## 2021-10-26 DIAGNOSIS — I110 Hypertensive heart disease with heart failure: Secondary | ICD-10-CM | POA: Diagnosis not present

## 2021-10-26 DIAGNOSIS — M19011 Primary osteoarthritis, right shoulder: Secondary | ICD-10-CM | POA: Diagnosis not present

## 2021-10-30 DIAGNOSIS — I110 Hypertensive heart disease with heart failure: Secondary | ICD-10-CM | POA: Diagnosis not present

## 2021-10-30 DIAGNOSIS — I482 Chronic atrial fibrillation, unspecified: Secondary | ICD-10-CM | POA: Diagnosis not present

## 2021-10-30 DIAGNOSIS — M160 Bilateral primary osteoarthritis of hip: Secondary | ICD-10-CM | POA: Diagnosis not present

## 2021-10-30 DIAGNOSIS — K2210 Ulcer of esophagus without bleeding: Secondary | ICD-10-CM | POA: Diagnosis not present

## 2021-10-30 DIAGNOSIS — Z953 Presence of xenogenic heart valve: Secondary | ICD-10-CM | POA: Diagnosis not present

## 2021-10-30 DIAGNOSIS — M1710 Unilateral primary osteoarthritis, unspecified knee: Secondary | ICD-10-CM | POA: Diagnosis not present

## 2021-10-30 DIAGNOSIS — Z9181 History of falling: Secondary | ICD-10-CM | POA: Diagnosis not present

## 2021-10-30 DIAGNOSIS — E1149 Type 2 diabetes mellitus with other diabetic neurological complication: Secondary | ICD-10-CM | POA: Diagnosis not present

## 2021-10-30 DIAGNOSIS — D689 Coagulation defect, unspecified: Secondary | ICD-10-CM | POA: Diagnosis not present

## 2021-10-30 DIAGNOSIS — M19011 Primary osteoarthritis, right shoulder: Secondary | ICD-10-CM | POA: Diagnosis not present

## 2021-10-30 DIAGNOSIS — Z7984 Long term (current) use of oral hypoglycemic drugs: Secondary | ICD-10-CM | POA: Diagnosis not present

## 2021-10-30 DIAGNOSIS — M19012 Primary osteoarthritis, left shoulder: Secondary | ICD-10-CM | POA: Diagnosis not present

## 2021-10-30 DIAGNOSIS — M19032 Primary osteoarthritis, left wrist: Secondary | ICD-10-CM | POA: Diagnosis not present

## 2021-10-30 DIAGNOSIS — I5032 Chronic diastolic (congestive) heart failure: Secondary | ICD-10-CM | POA: Diagnosis not present

## 2021-10-30 DIAGNOSIS — E782 Mixed hyperlipidemia: Secondary | ICD-10-CM | POA: Diagnosis not present

## 2021-10-30 DIAGNOSIS — E1151 Type 2 diabetes mellitus with diabetic peripheral angiopathy without gangrene: Secondary | ICD-10-CM | POA: Diagnosis not present

## 2021-10-30 DIAGNOSIS — I872 Venous insufficiency (chronic) (peripheral): Secondary | ICD-10-CM | POA: Diagnosis not present

## 2021-10-30 DIAGNOSIS — D62 Acute posthemorrhagic anemia: Secondary | ICD-10-CM | POA: Diagnosis not present

## 2021-10-31 DIAGNOSIS — M19012 Primary osteoarthritis, left shoulder: Secondary | ICD-10-CM | POA: Diagnosis not present

## 2021-10-31 DIAGNOSIS — D689 Coagulation defect, unspecified: Secondary | ICD-10-CM | POA: Diagnosis not present

## 2021-10-31 DIAGNOSIS — I482 Chronic atrial fibrillation, unspecified: Secondary | ICD-10-CM | POA: Diagnosis not present

## 2021-10-31 DIAGNOSIS — M19011 Primary osteoarthritis, right shoulder: Secondary | ICD-10-CM | POA: Diagnosis not present

## 2021-10-31 DIAGNOSIS — E782 Mixed hyperlipidemia: Secondary | ICD-10-CM | POA: Diagnosis not present

## 2021-10-31 DIAGNOSIS — E1149 Type 2 diabetes mellitus with other diabetic neurological complication: Secondary | ICD-10-CM | POA: Diagnosis not present

## 2021-10-31 DIAGNOSIS — Z7984 Long term (current) use of oral hypoglycemic drugs: Secondary | ICD-10-CM | POA: Diagnosis not present

## 2021-10-31 DIAGNOSIS — Z9181 History of falling: Secondary | ICD-10-CM | POA: Diagnosis not present

## 2021-10-31 DIAGNOSIS — M160 Bilateral primary osteoarthritis of hip: Secondary | ICD-10-CM | POA: Diagnosis not present

## 2021-10-31 DIAGNOSIS — Z953 Presence of xenogenic heart valve: Secondary | ICD-10-CM | POA: Diagnosis not present

## 2021-10-31 DIAGNOSIS — E1151 Type 2 diabetes mellitus with diabetic peripheral angiopathy without gangrene: Secondary | ICD-10-CM | POA: Diagnosis not present

## 2021-10-31 DIAGNOSIS — M19032 Primary osteoarthritis, left wrist: Secondary | ICD-10-CM | POA: Diagnosis not present

## 2021-10-31 DIAGNOSIS — I5032 Chronic diastolic (congestive) heart failure: Secondary | ICD-10-CM | POA: Diagnosis not present

## 2021-10-31 DIAGNOSIS — I110 Hypertensive heart disease with heart failure: Secondary | ICD-10-CM | POA: Diagnosis not present

## 2021-10-31 DIAGNOSIS — D62 Acute posthemorrhagic anemia: Secondary | ICD-10-CM | POA: Diagnosis not present

## 2021-10-31 DIAGNOSIS — K2210 Ulcer of esophagus without bleeding: Secondary | ICD-10-CM | POA: Diagnosis not present

## 2021-10-31 DIAGNOSIS — I872 Venous insufficiency (chronic) (peripheral): Secondary | ICD-10-CM | POA: Diagnosis not present

## 2021-10-31 DIAGNOSIS — M1710 Unilateral primary osteoarthritis, unspecified knee: Secondary | ICD-10-CM | POA: Diagnosis not present

## 2021-11-02 DIAGNOSIS — I482 Chronic atrial fibrillation, unspecified: Secondary | ICD-10-CM | POA: Diagnosis not present

## 2021-11-02 DIAGNOSIS — E1149 Type 2 diabetes mellitus with other diabetic neurological complication: Secondary | ICD-10-CM | POA: Diagnosis not present

## 2021-11-02 DIAGNOSIS — D689 Coagulation defect, unspecified: Secondary | ICD-10-CM | POA: Diagnosis not present

## 2021-11-02 DIAGNOSIS — E1151 Type 2 diabetes mellitus with diabetic peripheral angiopathy without gangrene: Secondary | ICD-10-CM | POA: Diagnosis not present

## 2021-11-02 DIAGNOSIS — M19032 Primary osteoarthritis, left wrist: Secondary | ICD-10-CM | POA: Diagnosis not present

## 2021-11-02 DIAGNOSIS — I5032 Chronic diastolic (congestive) heart failure: Secondary | ICD-10-CM | POA: Diagnosis not present

## 2021-11-02 DIAGNOSIS — M1710 Unilateral primary osteoarthritis, unspecified knee: Secondary | ICD-10-CM | POA: Diagnosis not present

## 2021-11-02 DIAGNOSIS — K2210 Ulcer of esophagus without bleeding: Secondary | ICD-10-CM | POA: Diagnosis not present

## 2021-11-02 DIAGNOSIS — Z7984 Long term (current) use of oral hypoglycemic drugs: Secondary | ICD-10-CM | POA: Diagnosis not present

## 2021-11-02 DIAGNOSIS — M19011 Primary osteoarthritis, right shoulder: Secondary | ICD-10-CM | POA: Diagnosis not present

## 2021-11-02 DIAGNOSIS — Z9181 History of falling: Secondary | ICD-10-CM | POA: Diagnosis not present

## 2021-11-02 DIAGNOSIS — M19012 Primary osteoarthritis, left shoulder: Secondary | ICD-10-CM | POA: Diagnosis not present

## 2021-11-02 DIAGNOSIS — I872 Venous insufficiency (chronic) (peripheral): Secondary | ICD-10-CM | POA: Diagnosis not present

## 2021-11-02 DIAGNOSIS — Z953 Presence of xenogenic heart valve: Secondary | ICD-10-CM | POA: Diagnosis not present

## 2021-11-02 DIAGNOSIS — I110 Hypertensive heart disease with heart failure: Secondary | ICD-10-CM | POA: Diagnosis not present

## 2021-11-02 DIAGNOSIS — M160 Bilateral primary osteoarthritis of hip: Secondary | ICD-10-CM | POA: Diagnosis not present

## 2021-11-02 DIAGNOSIS — E782 Mixed hyperlipidemia: Secondary | ICD-10-CM | POA: Diagnosis not present

## 2021-11-02 DIAGNOSIS — D62 Acute posthemorrhagic anemia: Secondary | ICD-10-CM | POA: Diagnosis not present

## 2021-11-05 DIAGNOSIS — M19011 Primary osteoarthritis, right shoulder: Secondary | ICD-10-CM | POA: Diagnosis not present

## 2021-11-05 DIAGNOSIS — E782 Mixed hyperlipidemia: Secondary | ICD-10-CM | POA: Diagnosis not present

## 2021-11-05 DIAGNOSIS — Z953 Presence of xenogenic heart valve: Secondary | ICD-10-CM | POA: Diagnosis not present

## 2021-11-05 DIAGNOSIS — I110 Hypertensive heart disease with heart failure: Secondary | ICD-10-CM | POA: Diagnosis not present

## 2021-11-05 DIAGNOSIS — K2210 Ulcer of esophagus without bleeding: Secondary | ICD-10-CM | POA: Diagnosis not present

## 2021-11-05 DIAGNOSIS — D62 Acute posthemorrhagic anemia: Secondary | ICD-10-CM | POA: Diagnosis not present

## 2021-11-05 DIAGNOSIS — M19032 Primary osteoarthritis, left wrist: Secondary | ICD-10-CM | POA: Diagnosis not present

## 2021-11-05 DIAGNOSIS — E1149 Type 2 diabetes mellitus with other diabetic neurological complication: Secondary | ICD-10-CM | POA: Diagnosis not present

## 2021-11-05 DIAGNOSIS — I5032 Chronic diastolic (congestive) heart failure: Secondary | ICD-10-CM | POA: Diagnosis not present

## 2021-11-05 DIAGNOSIS — D689 Coagulation defect, unspecified: Secondary | ICD-10-CM | POA: Diagnosis not present

## 2021-11-05 DIAGNOSIS — Z7984 Long term (current) use of oral hypoglycemic drugs: Secondary | ICD-10-CM | POA: Diagnosis not present

## 2021-11-05 DIAGNOSIS — I482 Chronic atrial fibrillation, unspecified: Secondary | ICD-10-CM | POA: Diagnosis not present

## 2021-11-05 DIAGNOSIS — E1151 Type 2 diabetes mellitus with diabetic peripheral angiopathy without gangrene: Secondary | ICD-10-CM | POA: Diagnosis not present

## 2021-11-05 DIAGNOSIS — M19012 Primary osteoarthritis, left shoulder: Secondary | ICD-10-CM | POA: Diagnosis not present

## 2021-11-05 DIAGNOSIS — Z9181 History of falling: Secondary | ICD-10-CM | POA: Diagnosis not present

## 2021-11-05 DIAGNOSIS — M1710 Unilateral primary osteoarthritis, unspecified knee: Secondary | ICD-10-CM | POA: Diagnosis not present

## 2021-11-05 DIAGNOSIS — I872 Venous insufficiency (chronic) (peripheral): Secondary | ICD-10-CM | POA: Diagnosis not present

## 2021-11-05 DIAGNOSIS — M160 Bilateral primary osteoarthritis of hip: Secondary | ICD-10-CM | POA: Diagnosis not present

## 2021-11-06 DIAGNOSIS — I482 Chronic atrial fibrillation, unspecified: Secondary | ICD-10-CM | POA: Diagnosis not present

## 2021-11-06 DIAGNOSIS — K2210 Ulcer of esophagus without bleeding: Secondary | ICD-10-CM | POA: Diagnosis not present

## 2021-11-06 DIAGNOSIS — R609 Edema, unspecified: Secondary | ICD-10-CM | POA: Diagnosis not present

## 2021-11-06 DIAGNOSIS — D62 Acute posthemorrhagic anemia: Secondary | ICD-10-CM | POA: Diagnosis not present

## 2021-11-06 DIAGNOSIS — M19012 Primary osteoarthritis, left shoulder: Secondary | ICD-10-CM | POA: Diagnosis not present

## 2021-11-06 DIAGNOSIS — D649 Anemia, unspecified: Secondary | ICD-10-CM | POA: Diagnosis not present

## 2021-11-06 DIAGNOSIS — E1140 Type 2 diabetes mellitus with diabetic neuropathy, unspecified: Secondary | ICD-10-CM | POA: Diagnosis not present

## 2021-11-06 DIAGNOSIS — M160 Bilateral primary osteoarthritis of hip: Secondary | ICD-10-CM | POA: Diagnosis not present

## 2021-11-06 DIAGNOSIS — I10 Essential (primary) hypertension: Secondary | ICD-10-CM | POA: Diagnosis not present

## 2021-11-06 DIAGNOSIS — E1151 Type 2 diabetes mellitus with diabetic peripheral angiopathy without gangrene: Secondary | ICD-10-CM | POA: Diagnosis not present

## 2021-11-06 DIAGNOSIS — I872 Venous insufficiency (chronic) (peripheral): Secondary | ICD-10-CM | POA: Diagnosis not present

## 2021-11-06 DIAGNOSIS — M19011 Primary osteoarthritis, right shoulder: Secondary | ICD-10-CM | POA: Diagnosis not present

## 2021-11-06 DIAGNOSIS — I5032 Chronic diastolic (congestive) heart failure: Secondary | ICD-10-CM | POA: Diagnosis not present

## 2021-11-06 DIAGNOSIS — Z7984 Long term (current) use of oral hypoglycemic drugs: Secondary | ICD-10-CM | POA: Diagnosis not present

## 2021-11-06 DIAGNOSIS — E1149 Type 2 diabetes mellitus with other diabetic neurological complication: Secondary | ICD-10-CM | POA: Diagnosis not present

## 2021-11-06 DIAGNOSIS — M1710 Unilateral primary osteoarthritis, unspecified knee: Secondary | ICD-10-CM | POA: Diagnosis not present

## 2021-11-06 DIAGNOSIS — Z09 Encounter for follow-up examination after completed treatment for conditions other than malignant neoplasm: Secondary | ICD-10-CM | POA: Diagnosis not present

## 2021-11-06 DIAGNOSIS — M19032 Primary osteoarthritis, left wrist: Secondary | ICD-10-CM | POA: Diagnosis not present

## 2021-11-06 DIAGNOSIS — E782 Mixed hyperlipidemia: Secondary | ICD-10-CM | POA: Diagnosis not present

## 2021-11-06 DIAGNOSIS — I110 Hypertensive heart disease with heart failure: Secondary | ICD-10-CM | POA: Diagnosis not present

## 2021-11-06 DIAGNOSIS — D689 Coagulation defect, unspecified: Secondary | ICD-10-CM | POA: Diagnosis not present

## 2021-11-06 DIAGNOSIS — I4891 Unspecified atrial fibrillation: Secondary | ICD-10-CM | POA: Diagnosis not present

## 2021-11-06 DIAGNOSIS — Z953 Presence of xenogenic heart valve: Secondary | ICD-10-CM | POA: Diagnosis not present

## 2021-11-06 DIAGNOSIS — Z9181 History of falling: Secondary | ICD-10-CM | POA: Diagnosis not present

## 2021-11-07 ENCOUNTER — Telehealth: Payer: Self-pay | Admitting: Internal Medicine

## 2021-11-07 DIAGNOSIS — E1149 Type 2 diabetes mellitus with other diabetic neurological complication: Secondary | ICD-10-CM | POA: Diagnosis not present

## 2021-11-07 DIAGNOSIS — M19012 Primary osteoarthritis, left shoulder: Secondary | ICD-10-CM | POA: Diagnosis not present

## 2021-11-07 DIAGNOSIS — I110 Hypertensive heart disease with heart failure: Secondary | ICD-10-CM | POA: Diagnosis not present

## 2021-11-07 DIAGNOSIS — M19011 Primary osteoarthritis, right shoulder: Secondary | ICD-10-CM | POA: Diagnosis not present

## 2021-11-07 DIAGNOSIS — E782 Mixed hyperlipidemia: Secondary | ICD-10-CM | POA: Diagnosis not present

## 2021-11-07 DIAGNOSIS — I872 Venous insufficiency (chronic) (peripheral): Secondary | ICD-10-CM | POA: Diagnosis not present

## 2021-11-07 DIAGNOSIS — Z7984 Long term (current) use of oral hypoglycemic drugs: Secondary | ICD-10-CM | POA: Diagnosis not present

## 2021-11-07 DIAGNOSIS — Z953 Presence of xenogenic heart valve: Secondary | ICD-10-CM | POA: Diagnosis not present

## 2021-11-07 DIAGNOSIS — D62 Acute posthemorrhagic anemia: Secondary | ICD-10-CM | POA: Diagnosis not present

## 2021-11-07 DIAGNOSIS — I5032 Chronic diastolic (congestive) heart failure: Secondary | ICD-10-CM | POA: Diagnosis not present

## 2021-11-07 DIAGNOSIS — D689 Coagulation defect, unspecified: Secondary | ICD-10-CM | POA: Diagnosis not present

## 2021-11-07 DIAGNOSIS — M19032 Primary osteoarthritis, left wrist: Secondary | ICD-10-CM | POA: Diagnosis not present

## 2021-11-07 DIAGNOSIS — E1151 Type 2 diabetes mellitus with diabetic peripheral angiopathy without gangrene: Secondary | ICD-10-CM | POA: Diagnosis not present

## 2021-11-07 DIAGNOSIS — M1710 Unilateral primary osteoarthritis, unspecified knee: Secondary | ICD-10-CM | POA: Diagnosis not present

## 2021-11-07 DIAGNOSIS — Z9181 History of falling: Secondary | ICD-10-CM | POA: Diagnosis not present

## 2021-11-07 DIAGNOSIS — I482 Chronic atrial fibrillation, unspecified: Secondary | ICD-10-CM | POA: Diagnosis not present

## 2021-11-07 DIAGNOSIS — K2210 Ulcer of esophagus without bleeding: Secondary | ICD-10-CM | POA: Diagnosis not present

## 2021-11-07 DIAGNOSIS — M160 Bilateral primary osteoarthritis of hip: Secondary | ICD-10-CM | POA: Diagnosis not present

## 2021-11-07 NOTE — Telephone Encounter (Signed)
°  Pt c/o swelling: STAT is pt has developed SOB within 24 hours  If swelling, where is the swelling located? Feet to upper thigh 2+  How much weight have you gained and in what time span? 6-8 lbs weight gain since pt left nursing facility  Have you gained 3 pounds in a day or 5 pounds in a week?   Do you have a log of your daily weights (if so, list)?   Are you currently taking a fluid pill? Yes   Are you currently SOB? Mild SOB  Have you traveled recently? No   Beth with [facility] called, she said she visit pt's yesterday and noticed edema on pt's feet to his upper thighs, pt did confirm edema is getting worst, pt's saw pcp yesterday but did not change any meds and when Beth called pcp she was advised to call heart doctor for recommendations

## 2021-11-07 NOTE — Telephone Encounter (Signed)
Attempted to return call to [facility] nurse.  Pt overdue to be seen.    Scheduled to see Dr. Loletha Grayer tomorrow.  Need to confirm.

## 2021-11-07 NOTE — Telephone Encounter (Signed)
Call placed to Pt's daughter.  Advised of appt 11/08/2021 at 2:40 pm with Dr. Loletha Grayer to evaluate edema.  Daughter in agreement.

## 2021-11-08 ENCOUNTER — Encounter: Payer: Self-pay | Admitting: Internal Medicine

## 2021-11-08 ENCOUNTER — Other Ambulatory Visit: Payer: Self-pay

## 2021-11-08 ENCOUNTER — Ambulatory Visit: Payer: Medicare Other | Admitting: Internal Medicine

## 2021-11-08 VITALS — BP 110/58 | HR 73 | Ht 68.5 in | Wt 171.6 lb

## 2021-11-08 DIAGNOSIS — D649 Anemia, unspecified: Secondary | ICD-10-CM | POA: Diagnosis not present

## 2021-11-08 DIAGNOSIS — K259 Gastric ulcer, unspecified as acute or chronic, without hemorrhage or perforation: Secondary | ICD-10-CM | POA: Diagnosis not present

## 2021-11-08 DIAGNOSIS — I5032 Chronic diastolic (congestive) heart failure: Secondary | ICD-10-CM

## 2021-11-08 MED ORDER — FUROSEMIDE 40 MG PO TABS: 40.0000 mg | ORAL_TABLET | Freq: Two times a day (BID) | ORAL | 3 refills | Status: DC

## 2021-11-08 NOTE — Patient Instructions (Signed)
Medication Instructions:  Your physician has recommended you make the following change in your medication: INCREASE: furosemide (Lasix) to 40 mg by mouth twice daily  **Call in which medication you are taking metoprolol and or carvedilol  *If you need a refill on your cardiac medications before your next appointment, please call your pharmacy*   Lab Work: IN 2 WEEKS: BMP If you have labs (blood work) drawn today and your tests are completely normal, you will receive your results only by: [place] (if you have MyChart) OR A paper copy in the mail If you have any lab test that is abnormal or we need to change your treatment, we will call you to review the results.   Testing/Procedures: Your physician has requested that you have an echocardiogram. Echocardiography is a painless test that uses sound waves to create images of your heart. It provides your doctor with information about the size and shape of your heart and how well your hearts chambers and valves are working. This procedure takes approximately one hour. There are no restrictions for this procedure.    Follow-Up: At [facility], you and your health needs are our priority.  As part of our continuing mission to provide you with exceptional heart care, we have created designated Provider Care Teams.  These Care Teams include your primary Cardiologist (physician) and Advanced Practice Providers (APPs -  Physician Assistants and Nurse Practitioners) who all work together to provide you with the care you need, when you need it.  We recommend signing up for the patient portal called "MyChart".  Sign up information is provided on this After Visit Summary.  MyChart is used to connect with patients for Virtual Visits (Telemedicine).  Patients are able to view lab/test results, encounter notes, upcoming appointments, etc.  Non-urgent messages can be sent to your provider as well.   To learn more about what you can do with MyChart,  go to [email or web address].    Your next appointment:   3 month(s)  The format for your next appointment:   In Person  Provider:   [name], MD

## 2021-11-08 NOTE — Progress Notes (Signed)
Cardiology Office Note:    Date:  11/08/2021   ID:  [name], DOB 05/01/1933, MRN [id number]  PCP:  [name], MD  [facility] Cardiologist:  Rudean Haskell MD [place] Electrophysiologist:  None   CC:  Follow up Diuresis Leg swelling  History of Present Illness:    [name] is a 88 y.o. male with a hx of Coronary Artery Calcification, severe aortic stenosis s/p transapical TAVR [name] 28, Dr. Burt Knack, 2014), HFpEF, Moderate Mitral Regurgitation, Moderate Tricuspid Regurgitation, DM with HTN, persistent atrial fibrillation seen by Dr. Lovena Le, PAD seen by Dr. [name] who presented for evaluation 09/11/20.  Found to have elevated BNP and was started on Farxiga and lasix 40 mg PO Daily- developed AKI.  Has had lasix does reduced and farixga held with improvement of kidney function but with leg swelling.  12/20 returned his diuretics.  Saw Dr. [name] and had normal ABIs.In interim of this visit.  In Summer 2022 his legs were improved, but by seing EP in the fall, his LE was worse, norvasx was switched to verapamil.  Patient notes that he is doing worse.  Patient notes that he had a fall at home.  Then had blood work with his PCP.  Found to have blood loss anemia and s/p 2 units pRBCs.  Found to have ulcers on EGD.  Stopped Warfarin and is on no antiplatelet meds.  No chest pain or pressure .  Since his hospitalization his LE is worse, with SOB, DOE, PND and orthopnea.  Yesterday he took two doses of his lasix 40 mg BID, he notes that he has lost 5 lbs since doing this.  Is in a wheelchair today, comes with daugther (which is new)  His MAR shows coreg and metoprolol.  He is unclear which he is taking (I am worried he is taking both).   Past Medical History:  Diagnosis Date   Anemia    LOW PLATELETS OTHER DAY  PER PT   Anticoagulated on Coumadin 12/21/2012   Aortic stenosis    Arthritis    "left wrist; back sometimes" (01/21/2013)   Atrial fibrillation ([place])     [name], DR COOPER   Bradycardia    Exertional shortness of breath    Heart murmur    "I've had it for years; runs in the family on daddy's side" (01/21/2013)   HTN (hypertension)    Hyperlipemia 12/21/2012   Prostate cancer ([place])    "had 40 tx of radiation in 2009" (01/21/2013)   S/P aortic valve replacement with bioprosthetic valve 01/19/2013   Transcatheter Aortic Valve Replacement using 98mm Sapien bioprosthetic tissue valve via transapical approach   Type II diabetes mellitus ([name])     Past Surgical History:  Procedure Laterality Date   BALLOON DILATION N/A 08/02/2020   Procedure: BALLOON DILATION;  Surgeon: [name], MD;  Location: [facility];  Service: Gastroenterology;  Laterality: N/A;   BIOPSY  10/03/2021   Procedure: BIOPSY;  Surgeon: [name], MD;  Location: [facility];  Service: Endoscopy;;   CARDIAC CATHETERIZATION  12/16/2012   CARDIAC VALVE REPLACEMENT  01/19/2013   AVR   CATARACT EXTRACTION W/ INTRAOCULAR LENS  IMPLANT, BILATERAL     ESOPHAGOGASTRODUODENOSCOPY N/A 10/03/2021   Procedure: ESOPHAGOGASTRODUODENOSCOPY (EGD);  Surgeon: [name], MD;  Location: [facility];  Service: Endoscopy;  Laterality: N/A;   ESOPHAGOGASTRODUODENOSCOPY (EGD) WITH PROPOFOL N/A 08/02/2020   Procedure: ESOPHAGOGASTRODUODENOSCOPY (EGD) WITH PROPOFOL;  Surgeon: [name], MD;  Location: [facility];  Service:  Gastroenterology;  Laterality: N/A;   EYE SURGERY     INTRAOPERATIVE TRANSESOPHAGEAL ECHOCARDIOGRAM N/A 01/19/2013   Procedure: INTRAOPERATIVE TRANSESOPHAGEAL ECHOCARDIOGRAM;  Surgeon: [name], MD;  Location: [facility];  Service: Open Heart Surgery;  Laterality: N/A;   ORIF FOREARM FRACTURE Left 1954   "compound fx" (01/21/2013)   TRANSTHORACIC ECHOCARDIOGRAM  09/04/10, 09/07/08    Current Medications: Current Meds  Medication Sig   ACCU-CHEK AVIVA PLUS test strip    ACCU-CHEK SOFTCLIX LANCETS lancets daily. use as directed   Acetaminophen (TYLENOL ARTHRITIS  PAIN PO) Take 650 mg by mouth daily with breakfast.   B Complex Vitamins (B COMPLEX 50) TABS Take 1 tablet by mouth daily with breakfast.   carvedilol (COREG) 12.5 MG tablet Take 12.5 mg by mouth 2 (two) times daily.   cholecalciferol (VITAMIN D3) 25 MCG (1000 UNIT) tablet Take 1,000 Units by mouth 2 (two) times daily.   dapagliflozin propanediol (FARXIGA) 10 MG TABS tablet Take 1 tablet (10 mg total) by mouth daily before breakfast.   doxazosin (CARDURA) 8 MG tablet Take 1 tablet (8 mg total) by mouth daily.   enzalutamide (XTANDI) 40 MG capsule Take 4 capsules (160 mg total) by mouth daily at 2 PM.   ferrous sulfate 325 (65 FE) MG tablet Take 325 mg by mouth See admin instructions. Take one tablet (325 mg) by mouth twice daily - with lunch and supper   furosemide (LASIX) 40 MG tablet Take 1 tablet (40 mg total) by mouth 2 (two) times daily.   gabapentin (NEURONTIN) 100 MG capsule Take 1 capsule (100 mg total) by mouth at bedtime.   lisinopril (ZESTRIL) 20 MG tablet Take 1 tablet (20 mg total) by mouth daily.   metFORMIN (GLUCOPHAGE) 1000 MG tablet Take 1 tablet (1,000 mg total) by mouth 2 (two) times daily with breakfast and lunch.   metoprolol succinate (TOPROL-XL) 50 MG 24 hr tablet Take 1 tablet (50 mg total) by mouth daily. Take with or immediately following a meal.   pantoprazole (PROTONIX) 40 MG tablet Take 1 tablet (40 mg total) by mouth daily.   rosuvastatin (CRESTOR) 5 MG tablet TAKE 1 TABLET BY MOUTH ONCE DAILY AT  6PM   Selenium 200 MCG CAPS Take 200 mcg by mouth daily with breakfast.   verapamil (CALAN-SR) 180 MG CR tablet Take 180 mg by mouth daily.   Vitamin A 2400 MCG (8000 UT) TABS Take 2,400 mcg by mouth daily with breakfast.   vitamin B-12 (CYANOCOBALAMIN) 1000 MCG tablet Take 1,000 mcg by mouth daily.   vitamin C (ASCORBIC ACID) 500 MG tablet Take 500 mg by mouth 2 (two) times daily.   zinc gluconate 50 MG tablet Take 50 mg by mouth daily with breakfast.   [DISCONTINUED]  furosemide (LASIX) 40 MG tablet Take 1 tablet (40 mg total) by mouth daily.     Allergies:   Amoxicillin   Social History   Socioeconomic History   Marital status: Married    Spouse name: Not on file   Number of children: Not on file   Years of education: Not on file   Highest education level: Not on file  Occupational History   Not on file  Tobacco Use   Smoking status: Former    Packs/day: 2.00    Years: 22.00    Pack years: 44.00    Types: Cigarettes    Quit date: 09/11/1976    Years since quitting: 45.1   Smokeless tobacco: Never  Vaping Use   Vaping  Use: Never used  Substance and Sexual Activity   Alcohol use: No   Drug use: No   Sexual activity: Never  Other Topics Concern   Not on file  Social History Narrative   Not on file   Social Determinants of Health   Financial Resource Strain: Not on file  Food Insecurity: Not on file  Transportation Needs: Not on file  Physical Activity: Not on file  Stress: Not on file  Social Connections: Not on file    Family History: The patient's family history includes Bone cancer (age of onset: 19) in his mother; Cancer (age of onset: 21) in his brother; Diabetes in his mother; Heart disease in his father; Melanoma (age of onset: 23) in his mother; Pancreatic cancer (age of onset: 67) in his father; Stroke in his sister.  ROS:   Please see the history of present illness.    All other systems reviewed and are negative.  EKGs/Labs/Other Studies Reviewed:    The following studies were reviewed today:  EKG:   09/11/20 Atrial fibrillation Rate 63  Transthoracic Echocardiogram: Date:08/18/20: Results: Moderate eccentric mitral regurgitation without prolapse and Moderate tricuspid regurgitation, mild RV dysfunction, TAVR gradients OK 1. Left ventricular ejection fraction, by estimation, is 55 to 60%. The  left ventricle has normal function. The left ventricle has no regional  wall motion abnormalities. There is moderate  left ventricular hypertrophy.  Left ventricular diastolic  parameters are indeterminate.   2. Right ventricular systolic function is mildly reduced. The right  ventricular size is normal. There is severely elevated pulmonary artery  systolic pressure. The estimated right ventricular systolic pressure is  46.9 mmHg.   3. Left atrial size was moderately dilated.   4. Right atrial size was moderately dilated.   5. The mitral valve is abnormal, there is restriction of the posterior  leaflet. Moderate mitral valve regurgitation with PISA ERO 0.25 cm^2. No  evidence of mitral stenosis. Moderate mitral annular calcification.   6. Tricuspid valve regurgitation is moderate.   7. S/p TAVR with 26 mm Edwards Sapien bioprosthesis. Mean gradient 8  mmHg, no evidence for significant stenosis. No significant peri-valvular  leakage noted.   8. The inferior vena cava is dilated in size with >50% respiratory  variability, suggesting right atrial pressure of 8 mmHg.   9. The patient was in atrial fibrillation.   Recent Labs: 11/22/2020: NT-Pro BNP 3,129 10/05/2021: ALT 19 10/06/2021: Magnesium 2.2 10/19/2021: BUN 34; Creatinine 0.9; Hemoglobin 8.7; Platelets 181; Potassium 4.7; Sodium 139  Recent Lipid Panel    Component Value Date/Time   CHOL 81 (L) 08/17/2018 0748   TRIG 82 08/17/2018 0748   HDL 28 (L) 08/17/2018 0748   CHOLHDL 2.9 08/17/2018 0748   LDLCALC 37 08/17/2018 0748    Physical Exam:    VS:  BP (!) 110/58    Pulse 73    Ht 5' 8.5" (1.74 m)    Wt 77.8 kg    SpO2 95%    BMI 25.71 kg/m     Wt Readings from Last 3 Encounters:  11/08/21 77.8 kg  10/23/21 76.4 kg  10/17/21 76.4 kg    Gen: No distress, Elderly Male   Neck: No JVD, no carotid bruit Ears: Bilateral Pilar Plate Sign Cardiac: No Rubs or Gallops holosystolic murmur, IRIR, +2 radial pulses Respiratory: Clear to auscultation bilaterally, normal effort, normal  respiratory rate GI: Soft, nontender, non-distended  MS: +2 bilateral  pitting edema;  moves all extremities but decreased LE strength from  prior Integument: Skin feels warm Neuro:  At time of evaluation, alert and oriented to person/place/time/situation  Psych: Normal affect, patient feels OK- worried about his kidneys   ASSESSMENT:    1. Chronic heart failure with preserved ejection fraction (HCC)     PLAN:    Severe AS s/p TAVR HFpEF Moderate Mitral Regurgitation Moderate Tricuspid Regurgitation HTN with DM Long standing, persistent atrial fibrillation seen by Dr. Lovena Le Peripheral Arterial Disease seen by Dr. [name] HLD with LDL < 55 - CHADSVASC 4 - NYHA class III, Stage C, hypervolemic  etiology likely atrial functional MR  - Diuretic regimen: Lasix 40 mg BID and labs in two weeks; when euvolemic will trial back to 40 mg PO daily, I expect his creatinine to increase - 2 L Fluid restriction  - Coreg at 12.5 mg PO BID ; patient to call in to confirm he is not also taking metoprolol succinate; if taking both will switch to just succinate 50 mg PO daily  - Lisinopril 20 mg Po daily - on verapamil 180 (norvasc was stopped prior) - SGLT2i at 10 mg PO daily  - Repeat Echo  - Has Clindamycin 600 mg PO PRN dental procedure (1 hr prior) - restart compression stockings  - will reach out to Drs. Magod and Lovena Le about ability to restart AC vs ASA  Time Spent Directly with Patient:   I have spent a total of 40 minutes with the patient reviewing notes, imaging, EKGs, labs and examining the patient as well as establishing an assessment and plan that was discussed personally with the patient.  > 50% of time was spent in direct patient care and family.   Medication Adjustments/Labs and Tests Ordered: Current medicines are reviewed at length with the patient today.  Concerns regarding medicines are outlined above.  Orders Placed This Encounter  Procedures   Basic metabolic panel   ECHOCARDIOGRAM COMPLETE   Meds ordered this encounter  Medications    furosemide (LASIX) 40 MG tablet    Sig: Take 1 tablet (40 mg total) by mouth 2 (two) times daily.    Dispense:  90 tablet    Refill:  3    Patient Instructions  Medication Instructions:  Your physician has recommended you make the following change in your medication: INCREASE: furosemide (Lasix) to 40 mg by mouth twice daily  **Call in which medication you are taking metoprolol and or carvedilol  *If you need a refill on your cardiac medications before your next appointment, please call your pharmacy*   Lab Work: IN 2 WEEKS: BMP If you have labs (blood work) drawn today and your tests are completely normal, you will receive your results only by: [place] (if you have MyChart) OR A paper copy in the mail If you have any lab test that is abnormal or we need to change your treatment, we will call you to review the results.   Testing/Procedures: Your physician has requested that you have an echocardiogram. Echocardiography is a painless test that uses sound waves to create images of your heart. It provides your doctor with information about the size and shape of your heart and how well your hearts chambers and valves are working. This procedure takes approximately one hour. There are no restrictions for this procedure.    Follow-Up: At [facility], you and your health needs are our priority.  As part of our continuing mission to provide you with exceptional heart care, we have created designated Provider Care Teams.  These Care  Teams include your primary Cardiologist (physician) and Advanced Practice Providers (APPs -  Physician Assistants and Nurse Practitioners) who all work together to provide you with the care you need, when you need it.  We recommend signing up for the patient portal called "MyChart".  Sign up information is provided on this After Visit Summary.  MyChart is used to connect with patients for Virtual Visits (Telemedicine).  Patients are able to view lab/test  results, encounter notes, upcoming appointments, etc.  Non-urgent messages can be sent to your provider as well.   To learn more about what you can do with MyChart, go to [email or web address].    Your next appointment:   3 month(s)  The format for your next appointment:   In Person  Provider:   [name], MD       Signed, [name], MD  11/08/2021 3:53 PM    [place]

## 2021-11-09 DIAGNOSIS — I482 Chronic atrial fibrillation, unspecified: Secondary | ICD-10-CM | POA: Diagnosis not present

## 2021-11-09 DIAGNOSIS — M160 Bilateral primary osteoarthritis of hip: Secondary | ICD-10-CM | POA: Diagnosis not present

## 2021-11-09 DIAGNOSIS — E782 Mixed hyperlipidemia: Secondary | ICD-10-CM | POA: Diagnosis not present

## 2021-11-09 DIAGNOSIS — M19012 Primary osteoarthritis, left shoulder: Secondary | ICD-10-CM | POA: Diagnosis not present

## 2021-11-09 DIAGNOSIS — Z9181 History of falling: Secondary | ICD-10-CM | POA: Diagnosis not present

## 2021-11-09 DIAGNOSIS — I5032 Chronic diastolic (congestive) heart failure: Secondary | ICD-10-CM | POA: Diagnosis not present

## 2021-11-09 DIAGNOSIS — E1149 Type 2 diabetes mellitus with other diabetic neurological complication: Secondary | ICD-10-CM | POA: Diagnosis not present

## 2021-11-09 DIAGNOSIS — K2210 Ulcer of esophagus without bleeding: Secondary | ICD-10-CM | POA: Diagnosis not present

## 2021-11-09 DIAGNOSIS — E1151 Type 2 diabetes mellitus with diabetic peripheral angiopathy without gangrene: Secondary | ICD-10-CM | POA: Diagnosis not present

## 2021-11-09 DIAGNOSIS — M19011 Primary osteoarthritis, right shoulder: Secondary | ICD-10-CM | POA: Diagnosis not present

## 2021-11-09 DIAGNOSIS — I110 Hypertensive heart disease with heart failure: Secondary | ICD-10-CM | POA: Diagnosis not present

## 2021-11-09 DIAGNOSIS — Z953 Presence of xenogenic heart valve: Secondary | ICD-10-CM | POA: Diagnosis not present

## 2021-11-09 DIAGNOSIS — I872 Venous insufficiency (chronic) (peripheral): Secondary | ICD-10-CM | POA: Diagnosis not present

## 2021-11-09 DIAGNOSIS — D62 Acute posthemorrhagic anemia: Secondary | ICD-10-CM | POA: Diagnosis not present

## 2021-11-09 DIAGNOSIS — D689 Coagulation defect, unspecified: Secondary | ICD-10-CM | POA: Diagnosis not present

## 2021-11-09 DIAGNOSIS — M1710 Unilateral primary osteoarthritis, unspecified knee: Secondary | ICD-10-CM | POA: Diagnosis not present

## 2021-11-09 DIAGNOSIS — Z7984 Long term (current) use of oral hypoglycemic drugs: Secondary | ICD-10-CM | POA: Diagnosis not present

## 2021-11-09 DIAGNOSIS — M19032 Primary osteoarthritis, left wrist: Secondary | ICD-10-CM | POA: Diagnosis not present

## 2021-11-12 DIAGNOSIS — Z9181 History of falling: Secondary | ICD-10-CM | POA: Diagnosis not present

## 2021-11-12 DIAGNOSIS — M160 Bilateral primary osteoarthritis of hip: Secondary | ICD-10-CM | POA: Diagnosis not present

## 2021-11-12 DIAGNOSIS — M19032 Primary osteoarthritis, left wrist: Secondary | ICD-10-CM | POA: Diagnosis not present

## 2021-11-12 DIAGNOSIS — Z953 Presence of xenogenic heart valve: Secondary | ICD-10-CM | POA: Diagnosis not present

## 2021-11-12 DIAGNOSIS — I110 Hypertensive heart disease with heart failure: Secondary | ICD-10-CM | POA: Diagnosis not present

## 2021-11-12 DIAGNOSIS — I5032 Chronic diastolic (congestive) heart failure: Secondary | ICD-10-CM | POA: Diagnosis not present

## 2021-11-12 DIAGNOSIS — D689 Coagulation defect, unspecified: Secondary | ICD-10-CM | POA: Diagnosis not present

## 2021-11-12 DIAGNOSIS — I482 Chronic atrial fibrillation, unspecified: Secondary | ICD-10-CM | POA: Diagnosis not present

## 2021-11-12 DIAGNOSIS — E1151 Type 2 diabetes mellitus with diabetic peripheral angiopathy without gangrene: Secondary | ICD-10-CM | POA: Diagnosis not present

## 2021-11-12 DIAGNOSIS — M19012 Primary osteoarthritis, left shoulder: Secondary | ICD-10-CM | POA: Diagnosis not present

## 2021-11-12 DIAGNOSIS — K2210 Ulcer of esophagus without bleeding: Secondary | ICD-10-CM | POA: Diagnosis not present

## 2021-11-12 DIAGNOSIS — E1149 Type 2 diabetes mellitus with other diabetic neurological complication: Secondary | ICD-10-CM | POA: Diagnosis not present

## 2021-11-12 DIAGNOSIS — M1710 Unilateral primary osteoarthritis, unspecified knee: Secondary | ICD-10-CM | POA: Diagnosis not present

## 2021-11-12 DIAGNOSIS — I872 Venous insufficiency (chronic) (peripheral): Secondary | ICD-10-CM | POA: Diagnosis not present

## 2021-11-12 DIAGNOSIS — M19011 Primary osteoarthritis, right shoulder: Secondary | ICD-10-CM | POA: Diagnosis not present

## 2021-11-12 DIAGNOSIS — Z7984 Long term (current) use of oral hypoglycemic drugs: Secondary | ICD-10-CM | POA: Diagnosis not present

## 2021-11-12 DIAGNOSIS — D62 Acute posthemorrhagic anemia: Secondary | ICD-10-CM | POA: Diagnosis not present

## 2021-11-12 DIAGNOSIS — E782 Mixed hyperlipidemia: Secondary | ICD-10-CM | POA: Diagnosis not present

## 2021-11-13 ENCOUNTER — Telehealth: Payer: Self-pay | Admitting: Internal Medicine

## 2021-11-13 DIAGNOSIS — D689 Coagulation defect, unspecified: Secondary | ICD-10-CM | POA: Diagnosis not present

## 2021-11-13 DIAGNOSIS — K2210 Ulcer of esophagus without bleeding: Secondary | ICD-10-CM | POA: Diagnosis not present

## 2021-11-13 DIAGNOSIS — Z953 Presence of xenogenic heart valve: Secondary | ICD-10-CM | POA: Diagnosis not present

## 2021-11-13 DIAGNOSIS — M19032 Primary osteoarthritis, left wrist: Secondary | ICD-10-CM | POA: Diagnosis not present

## 2021-11-13 DIAGNOSIS — E1151 Type 2 diabetes mellitus with diabetic peripheral angiopathy without gangrene: Secondary | ICD-10-CM | POA: Diagnosis not present

## 2021-11-13 DIAGNOSIS — M1710 Unilateral primary osteoarthritis, unspecified knee: Secondary | ICD-10-CM | POA: Diagnosis not present

## 2021-11-13 DIAGNOSIS — E1149 Type 2 diabetes mellitus with other diabetic neurological complication: Secondary | ICD-10-CM | POA: Diagnosis not present

## 2021-11-13 DIAGNOSIS — I5032 Chronic diastolic (congestive) heart failure: Secondary | ICD-10-CM | POA: Diagnosis not present

## 2021-11-13 DIAGNOSIS — M19011 Primary osteoarthritis, right shoulder: Secondary | ICD-10-CM | POA: Diagnosis not present

## 2021-11-13 DIAGNOSIS — D62 Acute posthemorrhagic anemia: Secondary | ICD-10-CM | POA: Diagnosis not present

## 2021-11-13 DIAGNOSIS — Z9181 History of falling: Secondary | ICD-10-CM | POA: Diagnosis not present

## 2021-11-13 DIAGNOSIS — M160 Bilateral primary osteoarthritis of hip: Secondary | ICD-10-CM | POA: Diagnosis not present

## 2021-11-13 DIAGNOSIS — I872 Venous insufficiency (chronic) (peripheral): Secondary | ICD-10-CM | POA: Diagnosis not present

## 2021-11-13 DIAGNOSIS — M19012 Primary osteoarthritis, left shoulder: Secondary | ICD-10-CM | POA: Diagnosis not present

## 2021-11-13 DIAGNOSIS — I482 Chronic atrial fibrillation, unspecified: Secondary | ICD-10-CM | POA: Diagnosis not present

## 2021-11-13 DIAGNOSIS — E782 Mixed hyperlipidemia: Secondary | ICD-10-CM | POA: Diagnosis not present

## 2021-11-13 DIAGNOSIS — I110 Hypertensive heart disease with heart failure: Secondary | ICD-10-CM | POA: Diagnosis not present

## 2021-11-13 DIAGNOSIS — Z7984 Long term (current) use of oral hypoglycemic drugs: Secondary | ICD-10-CM | POA: Diagnosis not present

## 2021-11-13 NOTE — Telephone Encounter (Signed)
Beth, from [facility] called stating patient is continuing to have swelling, 2+ edema in his LE from the mid-thigh down.  It's has gotten better than last week. She needs to know exactly what he should be taken as far as his fluid pills.  He told her that he has doubled-up on his fluid pills since last week.

## 2021-11-14 DIAGNOSIS — M19011 Primary osteoarthritis, right shoulder: Secondary | ICD-10-CM | POA: Diagnosis not present

## 2021-11-14 DIAGNOSIS — I5032 Chronic diastolic (congestive) heart failure: Secondary | ICD-10-CM | POA: Diagnosis not present

## 2021-11-14 DIAGNOSIS — I482 Chronic atrial fibrillation, unspecified: Secondary | ICD-10-CM | POA: Diagnosis not present

## 2021-11-14 DIAGNOSIS — M19032 Primary osteoarthritis, left wrist: Secondary | ICD-10-CM | POA: Diagnosis not present

## 2021-11-14 DIAGNOSIS — E1151 Type 2 diabetes mellitus with diabetic peripheral angiopathy without gangrene: Secondary | ICD-10-CM | POA: Diagnosis not present

## 2021-11-14 DIAGNOSIS — D62 Acute posthemorrhagic anemia: Secondary | ICD-10-CM | POA: Diagnosis not present

## 2021-11-14 DIAGNOSIS — M19012 Primary osteoarthritis, left shoulder: Secondary | ICD-10-CM | POA: Diagnosis not present

## 2021-11-14 DIAGNOSIS — D689 Coagulation defect, unspecified: Secondary | ICD-10-CM | POA: Diagnosis not present

## 2021-11-14 DIAGNOSIS — E782 Mixed hyperlipidemia: Secondary | ICD-10-CM | POA: Diagnosis not present

## 2021-11-14 DIAGNOSIS — I110 Hypertensive heart disease with heart failure: Secondary | ICD-10-CM | POA: Diagnosis not present

## 2021-11-14 DIAGNOSIS — Z7984 Long term (current) use of oral hypoglycemic drugs: Secondary | ICD-10-CM | POA: Diagnosis not present

## 2021-11-14 DIAGNOSIS — I872 Venous insufficiency (chronic) (peripheral): Secondary | ICD-10-CM | POA: Diagnosis not present

## 2021-11-14 DIAGNOSIS — M160 Bilateral primary osteoarthritis of hip: Secondary | ICD-10-CM | POA: Diagnosis not present

## 2021-11-14 DIAGNOSIS — Z953 Presence of xenogenic heart valve: Secondary | ICD-10-CM | POA: Diagnosis not present

## 2021-11-14 DIAGNOSIS — K2210 Ulcer of esophagus without bleeding: Secondary | ICD-10-CM | POA: Diagnosis not present

## 2021-11-14 DIAGNOSIS — Z9181 History of falling: Secondary | ICD-10-CM | POA: Diagnosis not present

## 2021-11-14 DIAGNOSIS — E1149 Type 2 diabetes mellitus with other diabetic neurological complication: Secondary | ICD-10-CM | POA: Diagnosis not present

## 2021-11-14 DIAGNOSIS — M1710 Unilateral primary osteoarthritis, unspecified knee: Secondary | ICD-10-CM | POA: Diagnosis not present

## 2021-11-14 NOTE — Telephone Encounter (Signed)
Caren Macadam, RN [facility] informed her that pt is to take furosemide 40 mg PO BID until 11/22/21. Pt will come in for f/u lab work on 11/21/21 based on results MD will determine further dosing.  Beth reports pt was unable to tell her what he is ordered to take. Eustaquio Maize will call and update pt and daughter.  All questions answered.

## 2021-11-14 NOTE — Telephone Encounter (Signed)
Left a message for Eustaquio Maize, RN [facility] to call back.

## 2021-11-14 NOTE — Telephone Encounter (Signed)
[name] with [facility] is returning call. She states her voicemail is secure and a voice message is fine is she is unavailable when her call is returned.

## 2021-11-15 DIAGNOSIS — M160 Bilateral primary osteoarthritis of hip: Secondary | ICD-10-CM | POA: Diagnosis not present

## 2021-11-15 DIAGNOSIS — M1710 Unilateral primary osteoarthritis, unspecified knee: Secondary | ICD-10-CM | POA: Diagnosis not present

## 2021-11-15 DIAGNOSIS — E1151 Type 2 diabetes mellitus with diabetic peripheral angiopathy without gangrene: Secondary | ICD-10-CM | POA: Diagnosis not present

## 2021-11-15 DIAGNOSIS — M19032 Primary osteoarthritis, left wrist: Secondary | ICD-10-CM | POA: Diagnosis not present

## 2021-11-15 DIAGNOSIS — E782 Mixed hyperlipidemia: Secondary | ICD-10-CM | POA: Diagnosis not present

## 2021-11-15 DIAGNOSIS — K2210 Ulcer of esophagus without bleeding: Secondary | ICD-10-CM | POA: Diagnosis not present

## 2021-11-15 DIAGNOSIS — Z953 Presence of xenogenic heart valve: Secondary | ICD-10-CM | POA: Diagnosis not present

## 2021-11-15 DIAGNOSIS — I872 Venous insufficiency (chronic) (peripheral): Secondary | ICD-10-CM | POA: Diagnosis not present

## 2021-11-15 DIAGNOSIS — I482 Chronic atrial fibrillation, unspecified: Secondary | ICD-10-CM | POA: Diagnosis not present

## 2021-11-15 DIAGNOSIS — E1149 Type 2 diabetes mellitus with other diabetic neurological complication: Secondary | ICD-10-CM | POA: Diagnosis not present

## 2021-11-15 DIAGNOSIS — D62 Acute posthemorrhagic anemia: Secondary | ICD-10-CM | POA: Diagnosis not present

## 2021-11-15 DIAGNOSIS — Z7984 Long term (current) use of oral hypoglycemic drugs: Secondary | ICD-10-CM | POA: Diagnosis not present

## 2021-11-15 DIAGNOSIS — I5032 Chronic diastolic (congestive) heart failure: Secondary | ICD-10-CM | POA: Diagnosis not present

## 2021-11-15 DIAGNOSIS — I110 Hypertensive heart disease with heart failure: Secondary | ICD-10-CM | POA: Diagnosis not present

## 2021-11-15 DIAGNOSIS — M19011 Primary osteoarthritis, right shoulder: Secondary | ICD-10-CM | POA: Diagnosis not present

## 2021-11-15 DIAGNOSIS — M19012 Primary osteoarthritis, left shoulder: Secondary | ICD-10-CM | POA: Diagnosis not present

## 2021-11-15 DIAGNOSIS — D689 Coagulation defect, unspecified: Secondary | ICD-10-CM | POA: Diagnosis not present

## 2021-11-15 DIAGNOSIS — Z9181 History of falling: Secondary | ICD-10-CM | POA: Diagnosis not present

## 2021-11-19 ENCOUNTER — Telehealth: Payer: Self-pay

## 2021-11-19 DIAGNOSIS — I5032 Chronic diastolic (congestive) heart failure: Secondary | ICD-10-CM | POA: Diagnosis not present

## 2021-11-19 DIAGNOSIS — Z9181 History of falling: Secondary | ICD-10-CM | POA: Diagnosis not present

## 2021-11-19 DIAGNOSIS — M1710 Unilateral primary osteoarthritis, unspecified knee: Secondary | ICD-10-CM | POA: Diagnosis not present

## 2021-11-19 DIAGNOSIS — M19012 Primary osteoarthritis, left shoulder: Secondary | ICD-10-CM | POA: Diagnosis not present

## 2021-11-19 DIAGNOSIS — I4811 Longstanding persistent atrial fibrillation: Secondary | ICD-10-CM

## 2021-11-19 DIAGNOSIS — E1149 Type 2 diabetes mellitus with other diabetic neurological complication: Secondary | ICD-10-CM | POA: Diagnosis not present

## 2021-11-19 DIAGNOSIS — Z953 Presence of xenogenic heart valve: Secondary | ICD-10-CM | POA: Diagnosis not present

## 2021-11-19 DIAGNOSIS — M19032 Primary osteoarthritis, left wrist: Secondary | ICD-10-CM | POA: Diagnosis not present

## 2021-11-19 DIAGNOSIS — Z79899 Other long term (current) drug therapy: Secondary | ICD-10-CM

## 2021-11-19 DIAGNOSIS — M160 Bilateral primary osteoarthritis of hip: Secondary | ICD-10-CM | POA: Diagnosis not present

## 2021-11-19 DIAGNOSIS — N1831 Chronic kidney disease, stage 3a: Secondary | ICD-10-CM

## 2021-11-19 DIAGNOSIS — M19011 Primary osteoarthritis, right shoulder: Secondary | ICD-10-CM | POA: Diagnosis not present

## 2021-11-19 DIAGNOSIS — I872 Venous insufficiency (chronic) (peripheral): Secondary | ICD-10-CM | POA: Diagnosis not present

## 2021-11-19 DIAGNOSIS — K2210 Ulcer of esophagus without bleeding: Secondary | ICD-10-CM | POA: Diagnosis not present

## 2021-11-19 DIAGNOSIS — E1151 Type 2 diabetes mellitus with diabetic peripheral angiopathy without gangrene: Secondary | ICD-10-CM | POA: Diagnosis not present

## 2021-11-19 DIAGNOSIS — I482 Chronic atrial fibrillation, unspecified: Secondary | ICD-10-CM | POA: Diagnosis not present

## 2021-11-19 DIAGNOSIS — D689 Coagulation defect, unspecified: Secondary | ICD-10-CM | POA: Diagnosis not present

## 2021-11-19 DIAGNOSIS — Z7984 Long term (current) use of oral hypoglycemic drugs: Secondary | ICD-10-CM | POA: Diagnosis not present

## 2021-11-19 DIAGNOSIS — E782 Mixed hyperlipidemia: Secondary | ICD-10-CM | POA: Diagnosis not present

## 2021-11-19 DIAGNOSIS — I110 Hypertensive heart disease with heart failure: Secondary | ICD-10-CM | POA: Diagnosis not present

## 2021-11-19 DIAGNOSIS — D62 Acute posthemorrhagic anemia: Secondary | ICD-10-CM | POA: Diagnosis not present

## 2021-11-19 MED ORDER — ASPIRIN EC 81 MG PO TBEC: 81.0000 mg | DELAYED_RELEASE_TABLET | Freq: Every day | ORAL | 3 refills | Status: DC

## 2021-11-19 NOTE — Telephone Encounter (Signed)
Called pt reviewed MD recommendation to start Asprin 81 mg PO QD.  Informed pt that f/u lab work will be needed in 2 weeks (12/05/21) if Hgb drops medication will be stopped.  Pt is agreeable to plan.  Orders placed and lab appointment scheduled.

## 2021-11-19 NOTE — Telephone Encounter (Signed)
-----   Message from [name], MD sent at 11/13/2021  8:24 AM EST ----- Hi [name], Discussed his AC and AF with EP and GI.  We will do a trial of aspirin 81 mo PO daily with no plans for full AC.  Per GI, patient will continue PPI.  CBC in two weeks post start and if further drop in Hgb will stop ASA.  Thanks, MAC     ----- Message ----- From: [name], MD Sent: 11/12/2021  12:38 PM EST To: [name], MD, [name], MD  Use least possible not both and continue pump ----- Message ----- From: [name], MD Sent: 11/08/2021   3:58 PM EST To: [name], MD, [name], MD  Hi all, Seeing [name] after his hospitalization.  Given his elevated CHADSVASC and stroke risk, normally would return him to warfarin or trial a DOAC.  He thought he was doing well in follow up but wanted to get both of your perspectives of if he could tolerate full AC or at least anti-platelet therapy based on his post EGD care.  Thanks, MAC

## 2021-11-21 ENCOUNTER — Ambulatory Visit (HOSPITAL_BASED_OUTPATIENT_CLINIC_OR_DEPARTMENT_OTHER): Payer: Medicare Other

## 2021-11-21 ENCOUNTER — Telehealth: Payer: Self-pay | Admitting: Internal Medicine

## 2021-11-21 ENCOUNTER — Other Ambulatory Visit: Payer: Self-pay

## 2021-11-21 ENCOUNTER — Other Ambulatory Visit: Payer: Medicare Other | Admitting: *Deleted

## 2021-11-21 DIAGNOSIS — M1710 Unilateral primary osteoarthritis, unspecified knee: Secondary | ICD-10-CM | POA: Diagnosis not present

## 2021-11-21 DIAGNOSIS — I6521 Occlusion and stenosis of right carotid artery: Secondary | ICD-10-CM | POA: Diagnosis not present

## 2021-11-21 DIAGNOSIS — M19032 Primary osteoarthritis, left wrist: Secondary | ICD-10-CM | POA: Diagnosis not present

## 2021-11-21 DIAGNOSIS — M160 Bilateral primary osteoarthritis of hip: Secondary | ICD-10-CM | POA: Diagnosis not present

## 2021-11-21 DIAGNOSIS — M19012 Primary osteoarthritis, left shoulder: Secondary | ICD-10-CM | POA: Diagnosis not present

## 2021-11-21 DIAGNOSIS — Z9181 History of falling: Secondary | ICD-10-CM | POA: Diagnosis not present

## 2021-11-21 DIAGNOSIS — E1149 Type 2 diabetes mellitus with other diabetic neurological complication: Secondary | ICD-10-CM | POA: Diagnosis not present

## 2021-11-21 DIAGNOSIS — E1151 Type 2 diabetes mellitus with diabetic peripheral angiopathy without gangrene: Secondary | ICD-10-CM | POA: Diagnosis not present

## 2021-11-21 DIAGNOSIS — Z7984 Long term (current) use of oral hypoglycemic drugs: Secondary | ICD-10-CM | POA: Diagnosis not present

## 2021-11-21 DIAGNOSIS — I5032 Chronic diastolic (congestive) heart failure: Secondary | ICD-10-CM

## 2021-11-21 DIAGNOSIS — I482 Chronic atrial fibrillation, unspecified: Secondary | ICD-10-CM | POA: Diagnosis not present

## 2021-11-21 DIAGNOSIS — E782 Mixed hyperlipidemia: Secondary | ICD-10-CM | POA: Diagnosis not present

## 2021-11-21 DIAGNOSIS — D689 Coagulation defect, unspecified: Secondary | ICD-10-CM | POA: Diagnosis not present

## 2021-11-21 DIAGNOSIS — Z953 Presence of xenogenic heart valve: Secondary | ICD-10-CM | POA: Diagnosis not present

## 2021-11-21 DIAGNOSIS — I872 Venous insufficiency (chronic) (peripheral): Secondary | ICD-10-CM | POA: Diagnosis not present

## 2021-11-21 DIAGNOSIS — I110 Hypertensive heart disease with heart failure: Secondary | ICD-10-CM | POA: Diagnosis not present

## 2021-11-21 DIAGNOSIS — D62 Acute posthemorrhagic anemia: Secondary | ICD-10-CM | POA: Diagnosis not present

## 2021-11-21 DIAGNOSIS — M19011 Primary osteoarthritis, right shoulder: Secondary | ICD-10-CM | POA: Diagnosis not present

## 2021-11-21 DIAGNOSIS — K2210 Ulcer of esophagus without bleeding: Secondary | ICD-10-CM | POA: Diagnosis not present

## 2021-11-21 LAB — BASIC METABOLIC PANEL
BUN/Creatinine Ratio: 31 — ABNORMAL HIGH (ref 10–24)
BUN: 38 mg/dL — ABNORMAL HIGH (ref 8–27)
CO2: 27 mmol/L (ref 20–29)
Calcium: 9.1 mg/dL (ref 8.6–10.2)
Chloride: 99 mmol/L (ref 96–106)
Creatinine, Ser: 1.24 mg/dL (ref 0.76–1.27)
Glucose: 115 mg/dL — ABNORMAL HIGH (ref 70–99)
Potassium: 4.7 mmol/L (ref 3.5–5.2)
Sodium: 140 mmol/L (ref 134–144)
eGFR: 57 mL/min/{1.73_m2} — ABNORMAL LOW (ref >59)

## 2021-11-21 LAB — ECHOCARDIOGRAM COMPLETE
AR max vel: 1.32 cm2
AV Area VTI: 1.33 cm2
AV Area mean vel: 1.34 cm2
AV Mean grad: 8 mmHg
AV Peak grad: 17.2 mmHg
Ao pk vel: 2.07 m/s
Area-P 1/2: 3.66 cm2
MV M vel: 4.76 m/s
MV Peak grad: 90.8 mmHg
MV VTI: 1.43 cm2
P 1/2 time: 289 msec
Radius: 1 cm
S' Lateral: 2.8 cm

## 2021-11-21 NOTE — Telephone Encounter (Signed)
Beth from [place] is calling reporting the patient's edema has gotten better overall since doubling up on Lasix, but he is now having weeping. She is wanting to confirm he should continue increased dosage through 11/22/21 as previously advised due to the pt coming in for an Echo and blood work today. Please advise.

## 2021-11-22 ENCOUNTER — Ambulatory Visit (HOSPITAL_COMMUNITY)
Admission: RE | Admit: 2021-11-22 | Discharge: 2021-11-22 | Disposition: A | Discharge status: Home / Self Care | Payer: Medicare Other | Source: Ambulatory Visit | Attending: Internal Medicine | Admitting: Internal Medicine

## 2021-11-22 DIAGNOSIS — I6521 Occlusion and stenosis of right carotid artery: Secondary | ICD-10-CM | POA: Diagnosis not present

## 2021-11-22 DIAGNOSIS — I5032 Chronic diastolic (congestive) heart failure: Secondary | ICD-10-CM | POA: Insufficient documentation

## 2021-11-23 DIAGNOSIS — E782 Mixed hyperlipidemia: Secondary | ICD-10-CM | POA: Diagnosis not present

## 2021-11-23 DIAGNOSIS — D62 Acute posthemorrhagic anemia: Secondary | ICD-10-CM | POA: Diagnosis not present

## 2021-11-23 DIAGNOSIS — I110 Hypertensive heart disease with heart failure: Secondary | ICD-10-CM | POA: Diagnosis not present

## 2021-11-23 DIAGNOSIS — I872 Venous insufficiency (chronic) (peripheral): Secondary | ICD-10-CM | POA: Diagnosis not present

## 2021-11-23 DIAGNOSIS — D689 Coagulation defect, unspecified: Secondary | ICD-10-CM | POA: Diagnosis not present

## 2021-11-23 DIAGNOSIS — K2210 Ulcer of esophagus without bleeding: Secondary | ICD-10-CM | POA: Diagnosis not present

## 2021-11-23 DIAGNOSIS — I482 Chronic atrial fibrillation, unspecified: Secondary | ICD-10-CM | POA: Diagnosis not present

## 2021-11-23 DIAGNOSIS — M19032 Primary osteoarthritis, left wrist: Secondary | ICD-10-CM | POA: Diagnosis not present

## 2021-11-23 DIAGNOSIS — Z953 Presence of xenogenic heart valve: Secondary | ICD-10-CM | POA: Diagnosis not present

## 2021-11-23 DIAGNOSIS — M1710 Unilateral primary osteoarthritis, unspecified knee: Secondary | ICD-10-CM | POA: Diagnosis not present

## 2021-11-23 DIAGNOSIS — I5032 Chronic diastolic (congestive) heart failure: Secondary | ICD-10-CM | POA: Diagnosis not present

## 2021-11-23 DIAGNOSIS — Z9181 History of falling: Secondary | ICD-10-CM | POA: Diagnosis not present

## 2021-11-23 DIAGNOSIS — Z7984 Long term (current) use of oral hypoglycemic drugs: Secondary | ICD-10-CM | POA: Diagnosis not present

## 2021-11-23 DIAGNOSIS — M19012 Primary osteoarthritis, left shoulder: Secondary | ICD-10-CM | POA: Diagnosis not present

## 2021-11-23 DIAGNOSIS — E1149 Type 2 diabetes mellitus with other diabetic neurological complication: Secondary | ICD-10-CM | POA: Diagnosis not present

## 2021-11-23 DIAGNOSIS — M160 Bilateral primary osteoarthritis of hip: Secondary | ICD-10-CM | POA: Diagnosis not present

## 2021-11-23 DIAGNOSIS — E1151 Type 2 diabetes mellitus with diabetic peripheral angiopathy without gangrene: Secondary | ICD-10-CM | POA: Diagnosis not present

## 2021-11-23 DIAGNOSIS — M19011 Primary osteoarthritis, right shoulder: Secondary | ICD-10-CM | POA: Diagnosis not present

## 2021-11-23 MED ORDER — TORSEMIDE 20 MG PO TABS: 40.0000 mg | ORAL_TABLET | Freq: Two times a day (BID) | ORAL | 11 refills | Status: DC

## 2021-11-23 NOTE — Telephone Encounter (Signed)
Follow Up:    [name] is calling to follow up from her call on Wednesday.

## 2021-11-23 NOTE — Telephone Encounter (Signed)
Spoke with Eustaquio Maize, RN [place].  Advised her of MD recommendations.  RN to call pt Daughter Venida Jarvis to inform her of changes. Pt to stop furosemide 40 mg PO BID and start torsemide 40 mg PO BID.   Pt has a lab appointment scheduled for 12/05/21 will add BMP and BNP to lab work.

## 2021-11-23 NOTE — Telephone Encounter (Signed)
Called pt unable to leave a message phone continued to ring with no voice mail. Will try back at a later time.

## 2021-11-26 ENCOUNTER — Telehealth: Payer: Self-pay | Admitting: Internal Medicine

## 2021-11-26 DIAGNOSIS — M19032 Primary osteoarthritis, left wrist: Secondary | ICD-10-CM | POA: Diagnosis not present

## 2021-11-26 DIAGNOSIS — Z9181 History of falling: Secondary | ICD-10-CM | POA: Diagnosis not present

## 2021-11-26 DIAGNOSIS — Z7984 Long term (current) use of oral hypoglycemic drugs: Secondary | ICD-10-CM | POA: Diagnosis not present

## 2021-11-26 DIAGNOSIS — D689 Coagulation defect, unspecified: Secondary | ICD-10-CM | POA: Diagnosis not present

## 2021-11-26 DIAGNOSIS — M19011 Primary osteoarthritis, right shoulder: Secondary | ICD-10-CM | POA: Diagnosis not present

## 2021-11-26 DIAGNOSIS — E1149 Type 2 diabetes mellitus with other diabetic neurological complication: Secondary | ICD-10-CM | POA: Diagnosis not present

## 2021-11-26 DIAGNOSIS — E1151 Type 2 diabetes mellitus with diabetic peripheral angiopathy without gangrene: Secondary | ICD-10-CM | POA: Diagnosis not present

## 2021-11-26 DIAGNOSIS — M19012 Primary osteoarthritis, left shoulder: Secondary | ICD-10-CM | POA: Diagnosis not present

## 2021-11-26 DIAGNOSIS — I872 Venous insufficiency (chronic) (peripheral): Secondary | ICD-10-CM | POA: Diagnosis not present

## 2021-11-26 DIAGNOSIS — I110 Hypertensive heart disease with heart failure: Secondary | ICD-10-CM | POA: Diagnosis not present

## 2021-11-26 DIAGNOSIS — K2210 Ulcer of esophagus without bleeding: Secondary | ICD-10-CM | POA: Diagnosis not present

## 2021-11-26 DIAGNOSIS — E782 Mixed hyperlipidemia: Secondary | ICD-10-CM | POA: Diagnosis not present

## 2021-11-26 DIAGNOSIS — M1710 Unilateral primary osteoarthritis, unspecified knee: Secondary | ICD-10-CM | POA: Diagnosis not present

## 2021-11-26 DIAGNOSIS — I5032 Chronic diastolic (congestive) heart failure: Secondary | ICD-10-CM | POA: Diagnosis not present

## 2021-11-26 DIAGNOSIS — I6521 Occlusion and stenosis of right carotid artery: Secondary | ICD-10-CM

## 2021-11-26 DIAGNOSIS — Z953 Presence of xenogenic heart valve: Secondary | ICD-10-CM | POA: Diagnosis not present

## 2021-11-26 DIAGNOSIS — D62 Acute posthemorrhagic anemia: Secondary | ICD-10-CM | POA: Diagnosis not present

## 2021-11-26 DIAGNOSIS — M160 Bilateral primary osteoarthritis of hip: Secondary | ICD-10-CM | POA: Diagnosis not present

## 2021-11-26 DIAGNOSIS — I482 Chronic atrial fibrillation, unspecified: Secondary | ICD-10-CM | POA: Diagnosis not present

## 2021-11-26 NOTE — Telephone Encounter (Signed)
Aaron Macadam, RN [facility] informed pt that MD ok pt wearing an [employer]. Beth informed that LLE swelling > RLE and concerned about possible cellulitis.  PCP prescribed doxycycline. Rn informed me that pt daughter has been notified of medication change from 11/23/21.  No further questions or concerns.

## 2021-11-26 NOTE — Telephone Encounter (Signed)
Beth from [facility] wanted to know if the patient could use an Haematologist on the patient's leg. He has open wounds that are weeping. She just wanted to double check. Please advise

## 2021-11-27 NOTE — Telephone Encounter (Signed)
Aaron Lean, MD  Precious Gilding, RN We have made some medical changes (increasing his diuretic).  I'd like to see him in February, given his severe mitral regurgitation, after we get the fluid out of him (given his legs were weeping), we should evaluate his mitral valve.  Depending on the severity of his valve leaking, we may need to discuss interventions to fix his valve  I spoke with patient's daughter and reviewed echo results with her and gave her above information.  I confirmed she is aware of diuretic changes and information about Unna boot.  Daughter aware of lab work scheduled for 2/1.  Appointment made for patient to see Dr Gasper Sells on Dec 13, 2021 at 11:40. Carotid doppler results reviewed with patient's daughter.

## 2021-11-27 NOTE — Telephone Encounter (Signed)
Pt's daughter is returning [name]'s call.

## 2021-11-27 NOTE — Addendum Note (Signed)
Addended by: Thompson Grayer on: 11/27/2021 08:47 AM   Modules accepted: Orders

## 2021-11-28 DIAGNOSIS — M1710 Unilateral primary osteoarthritis, unspecified knee: Secondary | ICD-10-CM | POA: Diagnosis not present

## 2021-11-28 DIAGNOSIS — D689 Coagulation defect, unspecified: Secondary | ICD-10-CM | POA: Diagnosis not present

## 2021-11-28 DIAGNOSIS — M19012 Primary osteoarthritis, left shoulder: Secondary | ICD-10-CM | POA: Diagnosis not present

## 2021-11-28 DIAGNOSIS — D62 Acute posthemorrhagic anemia: Secondary | ICD-10-CM | POA: Diagnosis not present

## 2021-11-28 DIAGNOSIS — Z953 Presence of xenogenic heart valve: Secondary | ICD-10-CM | POA: Diagnosis not present

## 2021-11-28 DIAGNOSIS — I482 Chronic atrial fibrillation, unspecified: Secondary | ICD-10-CM | POA: Diagnosis not present

## 2021-11-28 DIAGNOSIS — I110 Hypertensive heart disease with heart failure: Secondary | ICD-10-CM | POA: Diagnosis not present

## 2021-11-28 DIAGNOSIS — M19032 Primary osteoarthritis, left wrist: Secondary | ICD-10-CM | POA: Diagnosis not present

## 2021-11-28 DIAGNOSIS — E1149 Type 2 diabetes mellitus with other diabetic neurological complication: Secondary | ICD-10-CM | POA: Diagnosis not present

## 2021-11-28 DIAGNOSIS — E1151 Type 2 diabetes mellitus with diabetic peripheral angiopathy without gangrene: Secondary | ICD-10-CM | POA: Diagnosis not present

## 2021-11-28 DIAGNOSIS — M19011 Primary osteoarthritis, right shoulder: Secondary | ICD-10-CM | POA: Diagnosis not present

## 2021-11-28 DIAGNOSIS — K2210 Ulcer of esophagus without bleeding: Secondary | ICD-10-CM | POA: Diagnosis not present

## 2021-11-28 DIAGNOSIS — I5032 Chronic diastolic (congestive) heart failure: Secondary | ICD-10-CM | POA: Diagnosis not present

## 2021-11-28 DIAGNOSIS — E782 Mixed hyperlipidemia: Secondary | ICD-10-CM | POA: Diagnosis not present

## 2021-11-28 DIAGNOSIS — Z7984 Long term (current) use of oral hypoglycemic drugs: Secondary | ICD-10-CM | POA: Diagnosis not present

## 2021-11-28 DIAGNOSIS — Z9181 History of falling: Secondary | ICD-10-CM | POA: Diagnosis not present

## 2021-11-28 DIAGNOSIS — M160 Bilateral primary osteoarthritis of hip: Secondary | ICD-10-CM | POA: Diagnosis not present

## 2021-11-28 DIAGNOSIS — I872 Venous insufficiency (chronic) (peripheral): Secondary | ICD-10-CM | POA: Diagnosis not present

## 2021-12-03 DIAGNOSIS — D62 Acute posthemorrhagic anemia: Secondary | ICD-10-CM | POA: Diagnosis not present

## 2021-12-03 DIAGNOSIS — Z7984 Long term (current) use of oral hypoglycemic drugs: Secondary | ICD-10-CM | POA: Diagnosis not present

## 2021-12-03 DIAGNOSIS — I5032 Chronic diastolic (congestive) heart failure: Secondary | ICD-10-CM | POA: Diagnosis not present

## 2021-12-03 DIAGNOSIS — I482 Chronic atrial fibrillation, unspecified: Secondary | ICD-10-CM | POA: Diagnosis not present

## 2021-12-03 DIAGNOSIS — E1151 Type 2 diabetes mellitus with diabetic peripheral angiopathy without gangrene: Secondary | ICD-10-CM | POA: Diagnosis not present

## 2021-12-03 DIAGNOSIS — E1149 Type 2 diabetes mellitus with other diabetic neurological complication: Secondary | ICD-10-CM | POA: Diagnosis not present

## 2021-12-03 DIAGNOSIS — I872 Venous insufficiency (chronic) (peripheral): Secondary | ICD-10-CM | POA: Diagnosis not present

## 2021-12-03 DIAGNOSIS — I110 Hypertensive heart disease with heart failure: Secondary | ICD-10-CM | POA: Diagnosis not present

## 2021-12-03 DIAGNOSIS — M160 Bilateral primary osteoarthritis of hip: Secondary | ICD-10-CM | POA: Diagnosis not present

## 2021-12-03 DIAGNOSIS — E782 Mixed hyperlipidemia: Secondary | ICD-10-CM | POA: Diagnosis not present

## 2021-12-03 DIAGNOSIS — D689 Coagulation defect, unspecified: Secondary | ICD-10-CM | POA: Diagnosis not present

## 2021-12-03 DIAGNOSIS — Z9181 History of falling: Secondary | ICD-10-CM | POA: Diagnosis not present

## 2021-12-03 DIAGNOSIS — M19011 Primary osteoarthritis, right shoulder: Secondary | ICD-10-CM | POA: Diagnosis not present

## 2021-12-03 DIAGNOSIS — M1710 Unilateral primary osteoarthritis, unspecified knee: Secondary | ICD-10-CM | POA: Diagnosis not present

## 2021-12-03 DIAGNOSIS — K2210 Ulcer of esophagus without bleeding: Secondary | ICD-10-CM | POA: Diagnosis not present

## 2021-12-03 DIAGNOSIS — Z953 Presence of xenogenic heart valve: Secondary | ICD-10-CM | POA: Diagnosis not present

## 2021-12-03 DIAGNOSIS — M19032 Primary osteoarthritis, left wrist: Secondary | ICD-10-CM | POA: Diagnosis not present

## 2021-12-03 DIAGNOSIS — M19012 Primary osteoarthritis, left shoulder: Secondary | ICD-10-CM | POA: Diagnosis not present

## 2021-12-05 ENCOUNTER — Other Ambulatory Visit: Payer: Medicare Other | Admitting: *Deleted

## 2021-12-05 ENCOUNTER — Other Ambulatory Visit: Payer: Medicare Other

## 2021-12-05 ENCOUNTER — Other Ambulatory Visit: Payer: Self-pay

## 2021-12-05 DIAGNOSIS — R609 Edema, unspecified: Secondary | ICD-10-CM | POA: Diagnosis not present

## 2021-12-05 DIAGNOSIS — Z7984 Long term (current) use of oral hypoglycemic drugs: Secondary | ICD-10-CM | POA: Diagnosis not present

## 2021-12-05 DIAGNOSIS — I5032 Chronic diastolic (congestive) heart failure: Secondary | ICD-10-CM

## 2021-12-05 DIAGNOSIS — E1140 Type 2 diabetes mellitus with diabetic neuropathy, unspecified: Secondary | ICD-10-CM | POA: Diagnosis not present

## 2021-12-05 DIAGNOSIS — S81802A Unspecified open wound, left lower leg, initial encounter: Secondary | ICD-10-CM | POA: Diagnosis not present

## 2021-12-05 DIAGNOSIS — I4811 Longstanding persistent atrial fibrillation: Secondary | ICD-10-CM | POA: Diagnosis not present

## 2021-12-05 DIAGNOSIS — N1831 Chronic kidney disease, stage 3a: Secondary | ICD-10-CM | POA: Diagnosis not present

## 2021-12-05 DIAGNOSIS — D649 Anemia, unspecified: Secondary | ICD-10-CM | POA: Diagnosis not present

## 2021-12-05 DIAGNOSIS — Z79899 Other long term (current) drug therapy: Secondary | ICD-10-CM | POA: Diagnosis not present

## 2021-12-05 DIAGNOSIS — I4891 Unspecified atrial fibrillation: Secondary | ICD-10-CM | POA: Diagnosis not present

## 2021-12-06 DIAGNOSIS — Z7984 Long term (current) use of oral hypoglycemic drugs: Secondary | ICD-10-CM | POA: Diagnosis not present

## 2021-12-06 DIAGNOSIS — M19011 Primary osteoarthritis, right shoulder: Secondary | ICD-10-CM | POA: Diagnosis not present

## 2021-12-06 DIAGNOSIS — E1151 Type 2 diabetes mellitus with diabetic peripheral angiopathy without gangrene: Secondary | ICD-10-CM | POA: Diagnosis not present

## 2021-12-06 DIAGNOSIS — D689 Coagulation defect, unspecified: Secondary | ICD-10-CM | POA: Diagnosis not present

## 2021-12-06 DIAGNOSIS — K2210 Ulcer of esophagus without bleeding: Secondary | ICD-10-CM | POA: Diagnosis not present

## 2021-12-06 DIAGNOSIS — Z9181 History of falling: Secondary | ICD-10-CM | POA: Diagnosis not present

## 2021-12-06 DIAGNOSIS — M160 Bilateral primary osteoarthritis of hip: Secondary | ICD-10-CM | POA: Diagnosis not present

## 2021-12-06 DIAGNOSIS — I872 Venous insufficiency (chronic) (peripheral): Secondary | ICD-10-CM | POA: Diagnosis not present

## 2021-12-06 DIAGNOSIS — I482 Chronic atrial fibrillation, unspecified: Secondary | ICD-10-CM | POA: Diagnosis not present

## 2021-12-06 DIAGNOSIS — M19032 Primary osteoarthritis, left wrist: Secondary | ICD-10-CM | POA: Diagnosis not present

## 2021-12-06 DIAGNOSIS — M19012 Primary osteoarthritis, left shoulder: Secondary | ICD-10-CM | POA: Diagnosis not present

## 2021-12-06 DIAGNOSIS — E782 Mixed hyperlipidemia: Secondary | ICD-10-CM | POA: Diagnosis not present

## 2021-12-06 DIAGNOSIS — Z953 Presence of xenogenic heart valve: Secondary | ICD-10-CM | POA: Diagnosis not present

## 2021-12-06 DIAGNOSIS — D62 Acute posthemorrhagic anemia: Secondary | ICD-10-CM | POA: Diagnosis not present

## 2021-12-06 DIAGNOSIS — M1710 Unilateral primary osteoarthritis, unspecified knee: Secondary | ICD-10-CM | POA: Diagnosis not present

## 2021-12-06 DIAGNOSIS — E1149 Type 2 diabetes mellitus with other diabetic neurological complication: Secondary | ICD-10-CM | POA: Diagnosis not present

## 2021-12-06 DIAGNOSIS — I110 Hypertensive heart disease with heart failure: Secondary | ICD-10-CM | POA: Diagnosis not present

## 2021-12-06 DIAGNOSIS — I5032 Chronic diastolic (congestive) heart failure: Secondary | ICD-10-CM | POA: Diagnosis not present

## 2021-12-06 LAB — BASIC METABOLIC PANEL
BUN/Creatinine Ratio: 44 — ABNORMAL HIGH (ref 10–24)
BUN: 66 mg/dL — ABNORMAL HIGH (ref 8–27)
CO2: 28 mmol/L (ref 20–29)
Calcium: 9.5 mg/dL (ref 8.6–10.2)
Chloride: 96 mmol/L (ref 96–106)
Creatinine, Ser: 1.5 mg/dL — ABNORMAL HIGH (ref 0.76–1.27)
Glucose: 78 mg/dL (ref 70–99)
Potassium: 4.5 mmol/L (ref 3.5–5.2)
Sodium: 140 mmol/L (ref 134–144)
eGFR: 45 mL/min/{1.73_m2} — ABNORMAL LOW (ref >59)

## 2021-12-06 LAB — CBC
Hematocrit: 23.4 % — ABNORMAL LOW (ref 37.5–51.0)
Hemoglobin: 7.7 g/dL — ABNORMAL LOW (ref 13.0–17.7)
MCH: 30.7 pg (ref 26.6–33.0)
MCHC: 32.9 g/dL (ref 31.5–35.7)
MCV: 93 fL (ref 79–97)
Platelets: 232 10*3/uL (ref 150–450)
RBC: 2.51 x10E6/uL — CL (ref 4.14–5.80)
RDW: 14.4 % (ref 11.6–15.4)
WBC: 5.4 10*3/uL (ref 3.4–10.8)

## 2021-12-06 LAB — PRO B NATRIURETIC PEPTIDE: NT-Pro BNP: 6343 pg/mL — ABNORMAL HIGH (ref 0–486)

## 2021-12-10 ENCOUNTER — Telehealth: Payer: Self-pay | Admitting: Internal Medicine

## 2021-12-10 DIAGNOSIS — I482 Chronic atrial fibrillation, unspecified: Secondary | ICD-10-CM | POA: Diagnosis not present

## 2021-12-10 DIAGNOSIS — E1151 Type 2 diabetes mellitus with diabetic peripheral angiopathy without gangrene: Secondary | ICD-10-CM | POA: Diagnosis not present

## 2021-12-10 DIAGNOSIS — I110 Hypertensive heart disease with heart failure: Secondary | ICD-10-CM | POA: Diagnosis not present

## 2021-12-10 DIAGNOSIS — M19032 Primary osteoarthritis, left wrist: Secondary | ICD-10-CM | POA: Diagnosis not present

## 2021-12-10 DIAGNOSIS — M19011 Primary osteoarthritis, right shoulder: Secondary | ICD-10-CM | POA: Diagnosis not present

## 2021-12-10 DIAGNOSIS — D62 Acute posthemorrhagic anemia: Secondary | ICD-10-CM | POA: Diagnosis not present

## 2021-12-10 DIAGNOSIS — Z7984 Long term (current) use of oral hypoglycemic drugs: Secondary | ICD-10-CM | POA: Diagnosis not present

## 2021-12-10 DIAGNOSIS — D689 Coagulation defect, unspecified: Secondary | ICD-10-CM | POA: Diagnosis not present

## 2021-12-10 DIAGNOSIS — Z953 Presence of xenogenic heart valve: Secondary | ICD-10-CM | POA: Diagnosis not present

## 2021-12-10 DIAGNOSIS — M19012 Primary osteoarthritis, left shoulder: Secondary | ICD-10-CM | POA: Diagnosis not present

## 2021-12-10 DIAGNOSIS — Z9181 History of falling: Secondary | ICD-10-CM | POA: Diagnosis not present

## 2021-12-10 DIAGNOSIS — E782 Mixed hyperlipidemia: Secondary | ICD-10-CM | POA: Diagnosis not present

## 2021-12-10 DIAGNOSIS — E1149 Type 2 diabetes mellitus with other diabetic neurological complication: Secondary | ICD-10-CM | POA: Diagnosis not present

## 2021-12-10 DIAGNOSIS — M160 Bilateral primary osteoarthritis of hip: Secondary | ICD-10-CM | POA: Diagnosis not present

## 2021-12-10 DIAGNOSIS — K2210 Ulcer of esophagus without bleeding: Secondary | ICD-10-CM | POA: Diagnosis not present

## 2021-12-10 DIAGNOSIS — M1710 Unilateral primary osteoarthritis, unspecified knee: Secondary | ICD-10-CM | POA: Diagnosis not present

## 2021-12-10 DIAGNOSIS — I872 Venous insufficiency (chronic) (peripheral): Secondary | ICD-10-CM | POA: Diagnosis not present

## 2021-12-10 DIAGNOSIS — I5032 Chronic diastolic (congestive) heart failure: Secondary | ICD-10-CM | POA: Diagnosis not present

## 2021-12-10 NOTE — Telephone Encounter (Signed)
Spoke with Beth who states that the patient reported some dizziness and lightheadedness when walking over the past several days. She states that the patient was also tachycardic today with irregular heart rate. Patient does have atrial fibrillation. Will plan to discuss with Dr. Gasper Sells at appointment this week.

## 2021-12-10 NOTE — Telephone Encounter (Signed)
Beth from [facility] is calling stating she went to see pt today. Reports patient advised her the last several days he has been having dizzy/lightheadedness when walking. HR was 92-106 and irregular today. HR has been irregular, but it is a little higher than it has been. Not sure if dizziness is coming from that. HR has normally been ranging 70's-80's. Requesting this be discussed at 02/09 appt.

## 2021-12-12 NOTE — Progress Notes (Deleted)
Cardiology Office Note:    Date:  12/12/2021   ID:  [name], DOB 1933-10-06, MRN [id number]  PCP:  [name], MD  [facility] Cardiologist:  Rudean Haskell MD [place] Electrophysiologist:  None   CC:  Follow up Diuresis Leg swelling  History of Present Illness:    [name] is a 88 y.o. male with a hx of Coronary Artery Calcification, severe aortic stenosis s/p transapical TAVR [name] 28, Dr. Burt Knack, 2014), HFpEF, Moderate Mitral Regurgitation, Moderate Tricuspid Regurgitation, DM with HTN, persistent atrial fibrillation seen by Dr. Lovena Le, PAD seen by Dr. [name] who presented for evaluation 09/11/20.  Found to have elevated BNP and was started on Farxiga and lasix 40 mg PO Daily- developed AKI.  Has had lasix does reduced and farixga held with improvement of kidney function but with leg swelling.  12/20 returned his diuretics.  Saw Dr. [name] and had normal ABIs.In interim of this visit.  In Summer 2022 his legs were improved, but by seing EP in the fall, his LE was worse, norvasx was switched to verapamil.  We have had to significant increase his diuretic over the end of 2022 into 2023.  Patient notes that he is doing ***.   Since day prior/last visit notes *** . There are no*** interval hospital/ED visit.    No chest pain or pressure ***.  No SOB/DOE*** and no PND/Orthopnea***.  No weight gain or leg swelling***.  No palpitations or syncope ***.  Ambulatory blood pressure ***.    Past Medical History:  Diagnosis Date   Anemia    LOW PLATELETS OTHER DAY  PER PT   Anticoagulated on Coumadin 12/21/2012   Aortic stenosis    Arthritis    "left wrist; back sometimes" (01/21/2013)   Atrial fibrillation ([place])    [name], DR COOPER   Bradycardia    Exertional shortness of breath    Heart murmur    "I've had it for years; runs in the family on daddy's side" (01/21/2013)   HTN (hypertension)    Hyperlipemia 12/21/2012   Prostate cancer ([place])     "had 40 tx of radiation in 2009" (01/21/2013)   S/P aortic valve replacement with bioprosthetic valve 01/19/2013   Transcatheter Aortic Valve Replacement using 33mm Sapien bioprosthetic tissue valve via transapical approach   Type II diabetes mellitus ([place])     Past Surgical History:  Procedure Laterality Date   BALLOON DILATION N/A 08/02/2020   Procedure: BALLOON DILATION;  Surgeon: [name], MD;  Location: [facility];  Service: Gastroenterology;  Laterality: N/A;   BIOPSY  10/03/2021   Procedure: BIOPSY;  Surgeon: [name], MD;  Location: [facility];  Service: Endoscopy;;   CARDIAC CATHETERIZATION  12/16/2012   CARDIAC VALVE REPLACEMENT  01/19/2013   AVR   CATARACT EXTRACTION W/ INTRAOCULAR LENS  IMPLANT, BILATERAL     ESOPHAGOGASTRODUODENOSCOPY N/A 10/03/2021   Procedure: ESOPHAGOGASTRODUODENOSCOPY (EGD);  Surgeon: [name], MD;  Location: [facility];  Service: Endoscopy;  Laterality: N/A;   ESOPHAGOGASTRODUODENOSCOPY (EGD) WITH PROPOFOL N/A 08/02/2020   Procedure: ESOPHAGOGASTRODUODENOSCOPY (EGD) WITH PROPOFOL;  Surgeon: [name], MD;  Location: [facility];  Service: Gastroenterology;  Laterality: N/A;   EYE SURGERY     INTRAOPERATIVE TRANSESOPHAGEAL ECHOCARDIOGRAM N/A 01/19/2013   Procedure: INTRAOPERATIVE TRANSESOPHAGEAL ECHOCARDIOGRAM;  Surgeon: [name], MD;  Location: [facility];  Service: Open Heart Surgery;  Laterality: N/A;   ORIF FOREARM FRACTURE Left 1954   "compound fx" (01/21/2013)   TRANSTHORACIC ECHOCARDIOGRAM  09/04/10,  09/07/08    Current Medications: No outpatient medications have been marked as taking for the 12/13/21 encounter (Appointment) with [name], MD.     Allergies:   Amoxicillin   Social History   Socioeconomic History   Marital status: Married    Spouse name: Not on file   Number of children: Not on file   Years of education: Not on file   Highest education level: Not on file  Occupational History   Not on file   Tobacco Use   Smoking status: Former    Packs/day: 2.00    Years: 22.00    Pack years: 44.00    Types: Cigarettes    Quit date: 09/11/1976    Years since quitting: 45.2   Smokeless tobacco: Never  Vaping Use   Vaping Use: Never used  Substance and Sexual Activity   Alcohol use: No   Drug use: No   Sexual activity: Never  Other Topics Concern   Not on file  Social History Narrative   Not on file   Social Determinants of Health   Financial Resource Strain: Not on file  Food Insecurity: Not on file  Transportation Needs: Not on file  Physical Activity: Not on file  Stress: Not on file  Social Connections: Not on file    Family History: The patient's family history includes Bone cancer (age of onset: 61) in his mother; Cancer (age of onset: 41) in his brother; Diabetes in his mother; Heart disease in his father; Melanoma (age of onset: 57) in his mother; Pancreatic cancer (age of onset: 41) in his father; Stroke in his sister.  ROS:   Please see the history of present illness.    All other systems reviewed and are negative.  EKGs/Labs/Other Studies Reviewed:    The following studies were reviewed today:  EKG:   09/11/20 Atrial fibrillation Rate 63  Transthoracic Echocardiogram: Date:08/18/20: Results:   1. Left ventricular ejection fraction, by estimation, is 60 to 65%. The  left ventricle has normal function. The left ventricle has no regional  wall motion abnormalities. Left ventricular diastolic parameters are  indeterminate.   2. Mildly D-shaped septum suggestive of RV pressure/volume overload.  Right ventricular systolic function is mildly reduced. The right  ventricular size is mildly enlarged. There is severely elevated pulmonary  artery systolic pressure. The estimated right   ventricular systolic pressure is 03.4 mmHg.   3. Left atrial size was severely dilated.   4. Right atrial size was severely dilated.   5. The mitral valve is degenerative. Probably  severe mitral valve  regurgitation, PISA ERO 0.41 cm^2. No evidence of mitral stenosis.   6. The tricuspid valve is abnormal. Tricuspid valve regurgitation is  severe.   7. Bioprosthetic aortic valve s/p TAVR with 26 mm Edwards Sapien THV.  Trivial perivalvular leakage. Mean gradient 9 mmHg, DI 0.38.   8. Aortic dilatation noted. There is mild dilatation of the ascending  aorta, measuring 36 mm.   9. The inferior vena cava is dilated in size with <50% respiratory  variability, suggesting right atrial pressure of 15 mmHg.   Recent Labs: 10/05/2021: ALT 19 10/06/2021: Magnesium 2.2 12/05/2021: BUN 66; Creatinine, Ser 1.50; Hemoglobin 7.7; NT-Pro BNP 6,343; Platelets 232; Potassium 4.5; Sodium 140  Recent Lipid Panel    Component Value Date/Time   CHOL 81 (L) 08/17/2018 0748   TRIG 82 08/17/2018 0748   HDL 28 (L) 08/17/2018 0748   CHOLHDL 2.9 08/17/2018 0748   LDLCALC 37  08/17/2018 0748    Physical Exam:    VS:  There were no vitals taken for this visit.    Wt Readings from Last 3 Encounters:  11/08/21 77.8 kg  10/23/21 76.4 kg  10/17/21 76.4 kg    Gen: No distress, Elderly Male   Neck: No JVD, no carotid bruit Ears: Bilateral Pilar Plate Sign Cardiac: No Rubs or Gallops holosystolic murmur, IRIR, +2 radial pulses Respiratory: Clear to auscultation bilaterally, normal effort, normal  respiratory rate GI: Soft, nontender, non-distended  MS: +2 bilateral pitting edema;  moves all extremities but decreased LE strength from prior Integument: Skin feels warm Neuro:  At time of evaluation, alert and oriented to person/place/time/situation  Psych: Normal affect, patient feels OK- worried about his kidneys   ASSESSMENT:    No diagnosis found.   PLAN:    Severe AS s/p TAVR HFpEF Severe Functional Mitral Regurgitation Moderate Tricuspid Regurgitation HTN with DM Long standing, persistent atrial fibrillation seen by Dr. Lovena Le Peripheral Arterial Disease seen by Dr. [name] HLD with  LDL < 55 - CHADSVASC 4 - NYHA class III, Stage C, hypervolemic  etiology likely atrial functional MR - now of ASA and AC in the setting of anemia (see GT notes) - Diuretic regimen: *** - 2 L Fluid restriction  - BB  - Lisinopril 20 mg Po daily - on verapamil 180 (norvasc was stopped prior) - SGLT2i at 10 mg PO daily  - Has Clindamycin 600 mg PO PRN dental procedure (1 hr prior) - restart compression stockings, has Unna Boot  *** Structural TEE *** RHC   Medication Adjustments/Labs and Tests Ordered: Current medicines are reviewed at length with the patient today.  Concerns regarding medicines are outlined above.  No orders of the defined types were placed in this encounter.  No orders of the defined types were placed in this encounter.   There are no Patient Instructions on file for this visit.   Signed, [name], MD  12/12/2021 7:21 PM    [place]

## 2021-12-13 ENCOUNTER — Other Ambulatory Visit: Payer: Self-pay

## 2021-12-13 ENCOUNTER — Emergency Department (HOSPITAL_COMMUNITY): Payer: Medicare Other

## 2021-12-13 ENCOUNTER — Encounter (HOSPITAL_COMMUNITY): Payer: Self-pay | Admitting: *Deleted

## 2021-12-13 ENCOUNTER — Inpatient Hospital Stay (HOSPITAL_COMMUNITY)
Admission: EM | Admit: 2021-12-13 | Discharge: 2021-12-18 | DRG: 378 | Disposition: A | Discharge status: Skilled Nursing Facility | Payer: Medicare Other | Attending: Internal Medicine | Admitting: Internal Medicine

## 2021-12-13 ENCOUNTER — Ambulatory Visit: Payer: Medicare Other | Admitting: Internal Medicine

## 2021-12-13 DIAGNOSIS — Z8249 Family history of ischemic heart disease and other diseases of the circulatory system: Secondary | ICD-10-CM

## 2021-12-13 DIAGNOSIS — Z833 Family history of diabetes mellitus: Secondary | ICD-10-CM

## 2021-12-13 DIAGNOSIS — N1832 Chronic kidney disease, stage 3b: Secondary | ICD-10-CM | POA: Diagnosis present

## 2021-12-13 DIAGNOSIS — I130 Hypertensive heart and chronic kidney disease with heart failure and stage 1 through stage 4 chronic kidney disease, or unspecified chronic kidney disease: Secondary | ICD-10-CM | POA: Diagnosis present

## 2021-12-13 DIAGNOSIS — I10 Essential (primary) hypertension: Secondary | ICD-10-CM | POA: Diagnosis not present

## 2021-12-13 DIAGNOSIS — D62 Acute posthemorrhagic anemia: Secondary | ICD-10-CM | POA: Diagnosis present

## 2021-12-13 DIAGNOSIS — R531 Weakness: Secondary | ICD-10-CM | POA: Diagnosis not present

## 2021-12-13 DIAGNOSIS — D689 Coagulation defect, unspecified: Secondary | ICD-10-CM | POA: Diagnosis not present

## 2021-12-13 DIAGNOSIS — B9681 Helicobacter pylori [H. pylori] as the cause of diseases classified elsewhere: Secondary | ICD-10-CM | POA: Diagnosis not present

## 2021-12-13 DIAGNOSIS — I5032 Chronic diastolic (congestive) heart failure: Secondary | ICD-10-CM | POA: Diagnosis not present

## 2021-12-13 DIAGNOSIS — Z9842 Cataract extraction status, left eye: Secondary | ICD-10-CM

## 2021-12-13 DIAGNOSIS — S0990XA Unspecified injury of head, initial encounter: Secondary | ICD-10-CM | POA: Diagnosis not present

## 2021-12-13 DIAGNOSIS — M2578 Osteophyte, vertebrae: Secondary | ICD-10-CM | POA: Diagnosis not present

## 2021-12-13 DIAGNOSIS — K254 Chronic or unspecified gastric ulcer with hemorrhage: Secondary | ICD-10-CM | POA: Diagnosis not present

## 2021-12-13 DIAGNOSIS — W19XXXA Unspecified fall, initial encounter: Secondary | ICD-10-CM | POA: Diagnosis present

## 2021-12-13 DIAGNOSIS — Z741 Need for assistance with personal care: Secondary | ICD-10-CM | POA: Diagnosis not present

## 2021-12-13 DIAGNOSIS — D649 Anemia, unspecified: Secondary | ICD-10-CM | POA: Diagnosis present

## 2021-12-13 DIAGNOSIS — Z7901 Long term (current) use of anticoagulants: Secondary | ICD-10-CM

## 2021-12-13 DIAGNOSIS — K922 Gastrointestinal hemorrhage, unspecified: Secondary | ICD-10-CM

## 2021-12-13 DIAGNOSIS — R296 Repeated falls: Secondary | ICD-10-CM | POA: Diagnosis not present

## 2021-12-13 DIAGNOSIS — Z808 Family history of malignant neoplasm of other organs or systems: Secondary | ICD-10-CM

## 2021-12-13 DIAGNOSIS — E1151 Type 2 diabetes mellitus with diabetic peripheral angiopathy without gangrene: Secondary | ICD-10-CM | POA: Diagnosis present

## 2021-12-13 DIAGNOSIS — R011 Cardiac murmur, unspecified: Secondary | ICD-10-CM | POA: Diagnosis not present

## 2021-12-13 DIAGNOSIS — I2510 Atherosclerotic heart disease of native coronary artery without angina pectoris: Secondary | ICD-10-CM | POA: Diagnosis not present

## 2021-12-13 DIAGNOSIS — R0602 Shortness of breath: Secondary | ICD-10-CM | POA: Diagnosis not present

## 2021-12-13 DIAGNOSIS — N400 Enlarged prostate without lower urinary tract symptoms: Secondary | ICD-10-CM | POA: Diagnosis present

## 2021-12-13 DIAGNOSIS — E1165 Type 2 diabetes mellitus with hyperglycemia: Secondary | ICD-10-CM | POA: Diagnosis present

## 2021-12-13 DIAGNOSIS — Z79899 Other long term (current) drug therapy: Secondary | ICD-10-CM

## 2021-12-13 DIAGNOSIS — Z953 Presence of xenogenic heart valve: Secondary | ICD-10-CM

## 2021-12-13 DIAGNOSIS — E785 Hyperlipidemia, unspecified: Secondary | ICD-10-CM | POA: Diagnosis present

## 2021-12-13 DIAGNOSIS — K2901 Acute gastritis with bleeding: Principal | ICD-10-CM | POA: Diagnosis present

## 2021-12-13 DIAGNOSIS — I482 Chronic atrial fibrillation, unspecified: Secondary | ICD-10-CM | POA: Diagnosis not present

## 2021-12-13 DIAGNOSIS — Z880 Allergy status to penicillin: Secondary | ICD-10-CM | POA: Diagnosis not present

## 2021-12-13 DIAGNOSIS — D7589 Other specified diseases of blood and blood-forming organs: Secondary | ICD-10-CM | POA: Diagnosis not present

## 2021-12-13 DIAGNOSIS — A048 Other specified bacterial intestinal infections: Secondary | ICD-10-CM | POA: Diagnosis not present

## 2021-12-13 DIAGNOSIS — Z800 Family history of malignant neoplasm of digestive organs: Secondary | ICD-10-CM

## 2021-12-13 DIAGNOSIS — I083 Combined rheumatic disorders of mitral, aortic and tricuspid valves: Secondary | ICD-10-CM | POA: Diagnosis present

## 2021-12-13 DIAGNOSIS — K2900 Acute gastritis without bleeding: Secondary | ICD-10-CM | POA: Diagnosis not present

## 2021-12-13 DIAGNOSIS — N179 Acute kidney failure, unspecified: Secondary | ICD-10-CM | POA: Diagnosis not present

## 2021-12-13 DIAGNOSIS — Z8711 Personal history of peptic ulcer disease: Secondary | ICD-10-CM | POA: Diagnosis not present

## 2021-12-13 DIAGNOSIS — R6889 Other general symptoms and signs: Secondary | ICD-10-CM | POA: Diagnosis not present

## 2021-12-13 DIAGNOSIS — Z8546 Personal history of malignant neoplasm of prostate: Secondary | ICD-10-CM

## 2021-12-13 DIAGNOSIS — M6259 Muscle wasting and atrophy, not elsewhere classified, multiple sites: Secondary | ICD-10-CM | POA: Diagnosis not present

## 2021-12-13 DIAGNOSIS — Z923 Personal history of irradiation: Secondary | ICD-10-CM

## 2021-12-13 DIAGNOSIS — Y929 Unspecified place or not applicable: Secondary | ICD-10-CM | POA: Diagnosis not present

## 2021-12-13 DIAGNOSIS — Z823 Family history of stroke: Secondary | ICD-10-CM

## 2021-12-13 DIAGNOSIS — E1122 Type 2 diabetes mellitus with diabetic chronic kidney disease: Secondary | ICD-10-CM | POA: Diagnosis not present

## 2021-12-13 DIAGNOSIS — I517 Cardiomegaly: Secondary | ICD-10-CM | POA: Diagnosis not present

## 2021-12-13 DIAGNOSIS — D509 Iron deficiency anemia, unspecified: Secondary | ICD-10-CM | POA: Diagnosis not present

## 2021-12-13 DIAGNOSIS — E119 Type 2 diabetes mellitus without complications: Secondary | ICD-10-CM | POA: Diagnosis not present

## 2021-12-13 DIAGNOSIS — I4821 Permanent atrial fibrillation: Secondary | ICD-10-CM | POA: Diagnosis not present

## 2021-12-13 DIAGNOSIS — Z8673 Personal history of transient ischemic attack (TIA), and cerebral infarction without residual deficits: Secondary | ICD-10-CM

## 2021-12-13 DIAGNOSIS — K2951 Unspecified chronic gastritis with bleeding: Secondary | ICD-10-CM | POA: Diagnosis not present

## 2021-12-13 DIAGNOSIS — Z961 Presence of intraocular lens: Secondary | ICD-10-CM | POA: Diagnosis present

## 2021-12-13 DIAGNOSIS — Z20822 Contact with and (suspected) exposure to covid-19: Secondary | ICD-10-CM | POA: Diagnosis present

## 2021-12-13 DIAGNOSIS — Z7982 Long term (current) use of aspirin: Secondary | ICD-10-CM

## 2021-12-13 DIAGNOSIS — Z7984 Long term (current) use of oral hypoglycemic drugs: Secondary | ICD-10-CM

## 2021-12-13 DIAGNOSIS — K921 Melena: Secondary | ICD-10-CM | POA: Diagnosis not present

## 2021-12-13 DIAGNOSIS — Z87891 Personal history of nicotine dependence: Secondary | ICD-10-CM

## 2021-12-13 DIAGNOSIS — K2950 Unspecified chronic gastritis without bleeding: Secondary | ICD-10-CM | POA: Diagnosis not present

## 2021-12-13 DIAGNOSIS — R9431 Abnormal electrocardiogram [ECG] [EKG]: Secondary | ICD-10-CM | POA: Diagnosis not present

## 2021-12-13 DIAGNOSIS — Z9841 Cataract extraction status, right eye: Secondary | ICD-10-CM

## 2021-12-13 DIAGNOSIS — Z7401 Bed confinement status: Secondary | ICD-10-CM | POA: Diagnosis not present

## 2021-12-13 DIAGNOSIS — M6281 Muscle weakness (generalized): Secondary | ICD-10-CM | POA: Diagnosis not present

## 2021-12-13 DIAGNOSIS — R2681 Unsteadiness on feet: Secondary | ICD-10-CM | POA: Diagnosis not present

## 2021-12-13 LAB — TROPONIN I (HIGH SENSITIVITY)
Troponin I (High Sensitivity): 83 ng/L — ABNORMAL HIGH (ref <18)
Troponin I (High Sensitivity): 76 ng/L — ABNORMAL HIGH (ref <18)

## 2021-12-13 LAB — COMPREHENSIVE METABOLIC PANEL
ALT: 17 U/L (ref 0–44)
AST: 30 U/L (ref 15–41)
Albumin: 3.1 g/dL — ABNORMAL LOW (ref 3.5–5.0)
Alkaline Phosphatase: 58 U/L (ref 38–126)
Anion gap: 14 (ref 5–15)
BUN: 82 mg/dL — ABNORMAL HIGH (ref 8–23)
CO2: 24 mmol/L (ref 22–32)
Calcium: 9 mg/dL (ref 8.9–10.3)
Chloride: 101 mmol/L (ref 98–111)
Creatinine, Ser: 1.98 mg/dL — ABNORMAL HIGH (ref 0.61–1.24)
GFR, Estimated: 32 mL/min — ABNORMAL LOW (ref >60)
Glucose, Bld: 129 mg/dL — ABNORMAL HIGH (ref 70–99)
Potassium: 4.8 mmol/L (ref 3.5–5.1)
Sodium: 139 mmol/L (ref 135–145)
Total Bilirubin: 0.7 mg/dL (ref 0.3–1.2)
Total Protein: 5.7 g/dL — ABNORMAL LOW (ref 6.5–8.1)

## 2021-12-13 LAB — CBC WITH DIFFERENTIAL/PLATELET
Abs Immature Granulocytes: 0.02 10*3/uL (ref 0.00–0.07)
Basophils Absolute: 0 10*3/uL (ref 0.0–0.1)
Basophils Relative: 1 %
Eosinophils Absolute: 0.1 10*3/uL (ref 0.0–0.5)
Eosinophils Relative: 1 %
HCT: 20.9 % — ABNORMAL LOW (ref 39.0–52.0)
Hemoglobin: 6.5 g/dL — CL (ref 13.0–17.0)
Immature Granulocytes: 0 %
Lymphocytes Relative: 15 %
Lymphs Abs: 0.7 10*3/uL (ref 0.7–4.0)
MCH: 30.8 pg (ref 26.0–34.0)
MCHC: 31.1 g/dL (ref 30.0–36.0)
MCV: 99.1 fL (ref 80.0–100.0)
Monocytes Absolute: 0.7 10*3/uL (ref 0.1–1.0)
Monocytes Relative: 16 %
Neutro Abs: 3.2 10*3/uL (ref 1.7–7.7)
Neutrophils Relative %: 67 %
Platelets: 208 10*3/uL (ref 150–400)
RBC: 2.11 MIL/uL — ABNORMAL LOW (ref 4.22–5.81)
RDW: 16.6 % — ABNORMAL HIGH (ref 11.5–15.5)
WBC: 4.8 10*3/uL (ref 4.0–10.5)
nRBC: 0 % (ref 0.0–0.2)

## 2021-12-13 LAB — RESP PANEL BY RT-PCR (FLU A&B, COVID) ARPGX2
Influenza A by PCR: NEGATIVE
Influenza B by PCR: NEGATIVE
SARS Coronavirus 2 by RT PCR: NEGATIVE

## 2021-12-13 LAB — URINALYSIS, ROUTINE W REFLEX MICROSCOPIC
Bilirubin Urine: NEGATIVE
Glucose, UA: >=500 mg/dL — AB
Hgb urine dipstick: NEGATIVE
Ketones, ur: NEGATIVE mg/dL
Leukocytes,Ua: NEGATIVE
Nitrite: NEGATIVE
Protein, ur: NEGATIVE mg/dL
Specific Gravity, Urine: 1.011 (ref 1.005–1.030)
pH: 5 (ref 5.0–8.0)

## 2021-12-13 LAB — CBC
HCT: 22.7 % — ABNORMAL LOW (ref 39.0–52.0)
Hemoglobin: 7.5 g/dL — ABNORMAL LOW (ref 13.0–17.0)
MCH: 32.2 pg (ref 26.0–34.0)
MCHC: 33 g/dL (ref 30.0–36.0)
MCV: 97.4 fL (ref 80.0–100.0)
Platelets: 197 10*3/uL (ref 150–400)
RBC: 2.33 MIL/uL — ABNORMAL LOW (ref 4.22–5.81)
RDW: 16.6 % — ABNORMAL HIGH (ref 11.5–15.5)
WBC: 4.6 10*3/uL (ref 4.0–10.5)
nRBC: 0.4 % — ABNORMAL HIGH (ref 0.0–0.2)

## 2021-12-13 LAB — PREPARE RBC (CROSSMATCH)

## 2021-12-13 LAB — BRAIN NATRIURETIC PEPTIDE: B Natriuretic Peptide: 768.6 pg/mL — ABNORMAL HIGH (ref 0.0–100.0)

## 2021-12-13 LAB — CBG MONITORING, ED: Glucose-Capillary: 112 mg/dL — ABNORMAL HIGH (ref 70–99)

## 2021-12-13 LAB — PROTIME-INR
INR: 1.2 (ref 0.8–1.2)
Prothrombin Time: 15.2 seconds (ref 11.4–15.2)

## 2021-12-13 LAB — POC OCCULT BLOOD, ED: Fecal Occult Bld: POSITIVE — AB

## 2021-12-13 LAB — TSH: TSH: 1.653 u[IU]/mL (ref 0.350–4.500)

## 2021-12-13 MED ORDER — MELATONIN 3 MG PO TABS: 3.0000 mg | ORAL_TABLET | Freq: Every evening | ORAL | Status: DC | PRN

## 2021-12-13 MED ORDER — ONDANSETRON HCL 4 MG/2ML IJ SOLN: 4.0000 mg | Freq: Four times a day (QID) | INTRAMUSCULAR | Status: DC | PRN

## 2021-12-13 MED ORDER — B COMPLEX 50 PO TABS: 1.0000 | ORAL_TABLET | Freq: Every day | ORAL | Status: DC

## 2021-12-13 MED ORDER — SODIUM CHLORIDE 0.9 % IV SOLN
10.0000 mL/h | Freq: Once | INTRAVENOUS | Status: AC
  Administered 2021-12-13: 10 mL/h via INTRAVENOUS

## 2021-12-13 MED ORDER — SODIUM CHLORIDE 0.9 % IV BOLUS
500.0000 mL | Freq: Once | INTRAVENOUS | Status: AC
  Administered 2021-12-13: 500 mL via INTRAVENOUS

## 2021-12-13 MED ORDER — ACETAMINOPHEN 325 MG PO TABS: 650.0000 mg | ORAL_TABLET | Freq: Four times a day (QID) | ORAL | Status: DC | PRN

## 2021-12-13 MED ORDER — PANTOPRAZOLE SODIUM 40 MG IV SOLR
40.0000 mg | Freq: Two times a day (BID) | INTRAVENOUS | Status: DC
  Administered 2021-12-13 – 2021-12-17 (×8): 40 mg via INTRAVENOUS
  Filled 2021-12-13 (×8): qty 10

## 2021-12-13 MED ORDER — SODIUM CHLORIDE 0.9% IV SOLUTION: Freq: Once | INTRAVENOUS | Status: AC

## 2021-12-13 MED ORDER — GABAPENTIN 100 MG PO CAPS
100.0000 mg | ORAL_CAPSULE | Freq: Every day | ORAL | Status: DC
  Administered 2021-12-14 – 2021-12-17 (×5): 100 mg via ORAL
  Filled 2021-12-13 (×5): qty 1

## 2021-12-13 MED ORDER — VITAMIN B-12 1000 MCG PO TABS
1000.0000 ug | ORAL_TABLET | Freq: Every day | ORAL | Status: DC
  Administered 2021-12-15 – 2021-12-18 (×4): 1000 ug via ORAL
  Filled 2021-12-13 (×4): qty 1

## 2021-12-13 MED ORDER — VITAMIN D 25 MCG (1000 UNIT) PO TABS
1000.0000 [IU] | ORAL_TABLET | Freq: Every day | ORAL | Status: DC
  Administered 2021-12-15 – 2021-12-18 (×4): 1000 [IU] via ORAL
  Filled 2021-12-13 (×4): qty 1

## 2021-12-13 NOTE — H&P (Addendum)
History and Physical  LEXIE MORINI DZH:[id number] DOB: September 18, 1933 DOA: 12/13/2021  Referring physician: Dr. Lavone Orn, [place]  PCP: [name], MD  Outpatient Specialists: Cardiology Patient coming from: Home. Chief Complaint: Generalized weakness, fall last night.  HPI: [name] is a 88 y.o. male with medical history significant for coronary artery calcification, severe aortic stenosis status post TAVR, HFpEF, moderate mitral regurgitation, moderate tricuspid regurgitation, type 2 diabetes, essential hypertension, persistent atrial fibrillation not on oral anticoagulation, peripheral artery disease, CKD 3B, prior GI bleed with gastric ulcers who presented to [facility] ED with complaints of generalized weakness and a fall last night.  Patient denies any chest pain, dyspnea, or palpitations prior to his fall.  Denies tripping on any objects.  There has been no changes in his appetite.  He denies use of NSAIDs.  Endorses associated dark stools for the past 1 week.  He recalls hitting his head during the fall.  He was brought into the ED today from home for further evaluation and management of his symptomatology.  Work-up in the ED revealed symptomatic anemia with hemoglobin of 6.5K and positive FOBT.  1 unit of PRBCs was ordered to be transfused by EDP.  GI was also consulted by EDP.  TRH, hospitalist service, was asked to admit.  At the time of this evaluation, patient denies any abdominal pain except when palpated, tenderness is localized in the right upper quadrant.  Last bowel movement was on 12/12/2021, semiformed and dark.  Patient was started on IV Protonix 40 mg twice daily in the ED.  UA and COVID-19 screening tests are pending at the time of this dictation.   ED Course: Tmax 97.7.  BP 100/60, pulse 91, respiratory rate 18, O2 saturation of 100% on room air.  Lab studies remarkable for creatinine 1.98 from baseline of 1.5(GFR 44).  Hemoglobin 6.5/hematocrit 20.9.  Review of Systems: Review of  systems as noted in the HPI. All other systems reviewed and are negative.   Past Medical History:  Diagnosis Date   Anemia    LOW PLATELETS OTHER DAY  PER PT   Anticoagulated on Coumadin 12/21/2012   Aortic stenosis    Arthritis    "left wrist; back sometimes" (01/21/2013)   Atrial fibrillation ([place])    [name], DR COOPER   Bradycardia    Exertional shortness of breath    Heart murmur    "I've had it for years; runs in the family on daddy's side" (01/21/2013)   HTN (hypertension)    Hyperlipemia 12/21/2012   Prostate cancer ([place])    "had 40 tx of radiation in 2009" (01/21/2013)   S/P aortic valve replacement with bioprosthetic valve 01/19/2013   Transcatheter Aortic Valve Replacement using 45mm Sapien bioprosthetic tissue valve via transapical approach   Type II diabetes mellitus ([place])    Past Surgical History:  Procedure Laterality Date   BALLOON DILATION N/A 08/02/2020   Procedure: BALLOON DILATION;  Surgeon: [name], MD;  Location: [facility];  Service: Gastroenterology;  Laterality: N/A;   BIOPSY  10/03/2021   Procedure: BIOPSY;  Surgeon: [name], MD;  Location: [facility];  Service: Endoscopy;;   CARDIAC CATHETERIZATION  12/16/2012   CARDIAC VALVE REPLACEMENT  01/19/2013   AVR   CATARACT EXTRACTION W/ INTRAOCULAR LENS  IMPLANT, BILATERAL     ESOPHAGOGASTRODUODENOSCOPY N/A 10/03/2021   Procedure: ESOPHAGOGASTRODUODENOSCOPY (EGD);  Surgeon: [name], MD;  Location: [facility];  Service: Endoscopy;  Laterality: N/A;   ESOPHAGOGASTRODUODENOSCOPY (EGD) WITH PROPOFOL N/A 08/02/2020  Procedure: ESOPHAGOGASTRODUODENOSCOPY (EGD) WITH PROPOFOL;  Surgeon: [name], MD;  Location: [facility];  Service: Gastroenterology;  Laterality: N/A;   EYE SURGERY     INTRAOPERATIVE TRANSESOPHAGEAL ECHOCARDIOGRAM N/A 01/19/2013   Procedure: INTRAOPERATIVE TRANSESOPHAGEAL ECHOCARDIOGRAM;  Surgeon: [name], MD;  Location: [place];  Service: Open Heart Surgery;  Laterality:  N/A;   ORIF FOREARM FRACTURE Left 1954   "compound fx" (01/21/2013)   TRANSTHORACIC ECHOCARDIOGRAM  09/04/10, 09/07/08    Social History:  reports that he quit smoking about 45 years ago. His smoking use included cigarettes. He has a 44.00 pack-year smoking history. He has never used smokeless tobacco. He reports that he does not drink alcohol and does not use drugs.   Allergies  Allergen Reactions   Amoxicillin Other (See Comments)    Burning of mouth, tongue and lips.  itchy, burning throat    Family History  Problem Relation Age of Onset   Diabetes Mother    Melanoma Mother 72   Bone cancer Mother 45   Pancreatic cancer Father 19   Heart disease Father    Stroke Sister    Cancer Brother 27       ? brain cancer      Prior to Admission medications   Medication Sig Start Date End Date Taking? Authorizing Provider  ACCU-CHEK AVIVA PLUS test strip  05/23/18   [provider]  ACCU-CHEK SOFTCLIX LANCETS lancets daily. use as directed 04/07/18   [provider]  Acetaminophen (TYLENOL ARTHRITIS PAIN PO) Take 650 mg by mouth daily with breakfast.    [provider]  Amino Acids-Protein Hydrolys (FEEDING SUPPLEMENT, PRO-STAT SUGAR FREE 64,) LIQD Take 30 mLs by mouth 2 (two) times daily. Patient not taking: Reported on 11/08/2021    [provider]  aspirin EC 81 MG tablet Take 1 tablet (81 mg total) by mouth daily. Swallow whole. 11/19/21   [name], MD  B Complex Vitamins (B COMPLEX 50) TABS Take 1 tablet by mouth daily with breakfast.    [provider]  bisacodyl (DULCOLAX) 10 MG suppository Place rectally as needed for moderate constipation. If not relieved by MOM, give 10 mg Bisacodyl suppositiory in 24 hours as needed Patient not taking: Reported on 11/08/2021    [provider]  carvedilol (COREG) 12.5 MG tablet Take 12.5 mg by mouth 2 (two) times daily. 11/08/21   [provider]  cholecalciferol (VITAMIN  D3) 25 MCG (1000 UNIT) tablet Take 1,000 Units by mouth 2 (two) times daily.    [provider]  dapagliflozin propanediol (FARXIGA) 10 MG TABS tablet Take 1 tablet (10 mg total) by mouth daily before breakfast. 10/23/21   Medina-Vargas, Monina C, NP  doxazosin (CARDURA) 8 MG tablet Take 1 tablet (8 mg total) by mouth daily. 10/23/21   Medina-Vargas, Monina C, NP  enzalutamide (XTANDI) 40 MG capsule Take 4 capsules (160 mg total) by mouth daily at 2 PM. 10/23/21   Medina-Vargas, Monina C, NP  ferrous sulfate 325 (65 FE) MG tablet Take 325 mg by mouth See admin instructions. Take one tablet (325 mg) by mouth twice daily - with lunch and supper    [provider]  gabapentin (NEURONTIN) 100 MG capsule Take 1 capsule (100 mg total) by mouth at bedtime. 10/23/21   Medina-Vargas, Monina C, NP  lisinopril (ZESTRIL) 20 MG tablet Take 1 tablet (20 mg total) by mouth daily. 10/23/21   Medina-Vargas, Monina C, NP  magnesium hydroxide (MILK OF MAGNESIA) 400 MG/5ML  suspension Take by mouth daily as needed for mild constipation. If no BM in 3 days, give 30 cc Milk of Magnesium in 24 hours as needed Patient not taking: Reported on 11/08/2021    [provider]  metFORMIN (GLUCOPHAGE) 1000 MG tablet Take 1 tablet (1,000 mg total) by mouth 2 (two) times daily with breakfast and lunch. 10/23/21   Medina-Vargas, Monina C, NP  metoprolol succinate (TOPROL-XL) 50 MG 24 hr tablet Take 1 tablet (50 mg total) by mouth daily. Take with or immediately following a meal. 10/23/21   Medina-Vargas, Monina C, NP  pantoprazole (PROTONIX) 40 MG tablet Take 1 tablet (40 mg total) by mouth daily. 10/23/21   Medina-Vargas, Monina C, NP  rosuvastatin (CRESTOR) 5 MG tablet TAKE 1 TABLET BY MOUTH ONCE DAILY AT  6PM 10/23/21   Medina-Vargas, Monina C, NP  Selenium 200 MCG CAPS Take 200 mcg by mouth daily with breakfast.    [provider]  simethicone (MYLICON) 80 MG chewable tablet Chew 1 tablet (80 mg  total) by mouth 4 (four) times daily as needed for flatulence. Patient not taking: Reported on 11/08/2021 10/08/21   Lavina Hamman, MD  Sodium Phosphates (RA SALINE ENEMA RE) If not relieved by Biscodyl suppository, give disposable Saline Enema rectally x1 dose 24 hrs as needed Patient not taking: Reported on 11/08/2021    [provider]  torsemide (DEMADEX) 20 MG tablet Take 2 tablets (40 mg total) by mouth 2 (two) times daily. 11/23/21 02/21/22  [name], MD  verapamil (CALAN-SR) 180 MG CR tablet Take 180 mg by mouth daily. 11/08/21   [provider]  Vitamin A 2400 MCG (8000 UT) TABS Take 2,400 mcg by mouth daily with breakfast.    [provider]  vitamin B-12 (CYANOCOBALAMIN) 1000 MCG tablet Take 1,000 mcg by mouth daily.    [provider]  vitamin C (ASCORBIC ACID) 500 MG tablet Take 500 mg by mouth 2 (two) times daily.    [provider]  zinc gluconate 50 MG tablet Take 50 mg by mouth daily with breakfast.    [provider]    Physical Exam: BP (!) 104/55    Pulse 97    Temp 97.7 F (36.5 C) (Oral)    Resp 20    SpO2 100%   General: 88 y.o. year-old male well developed well nourished in no acute distress.  Alert and oriented x3. Cardiovascular: Regular rate and rhythm with no rubs or gallops.  No thyromegaly or JVD noted.  No lower extremity edema. 2/4 pulses in all 4 extremities. Respiratory: Clear to auscultation with no wheezes or rales. Good inspiratory effort. Abdomen: Soft nontender nondistended with normal bowel sounds x4 quadrants. Muskuloskeletal: No cyanosis, clubbing or edema noted bilaterally Neuro: CN II-XII intact, strength, sensation, reflexes Skin: No ulcerative lesions noted or rashes Psychiatry: Judgement and insight appear normal. Mood is appropriate for condition and setting          Labs on Admission:  Basic Metabolic Panel: Recent Labs  Lab 12/13/21 0726  NA 139  K 4.8  CL 101  CO2 24   GLUCOSE 129*  BUN 82*  CREATININE 1.98*  CALCIUM 9.0   Liver Function Tests: Recent Labs  Lab 12/13/21 0726  AST 30  ALT 17  ALKPHOS 58  BILITOT 0.7  PROT 5.7*  ALBUMIN 3.1*   No results for input(s): LIPASE, AMYLASE in the last 168 hours. No results for input(s): AMMONIA in the last 168 hours.  CBC: Recent Labs  Lab 12/13/21 0726  WBC 4.8  NEUTROABS 3.2  HGB 6.5*  HCT 20.9*  MCV 99.1  PLT 208   Cardiac Enzymes: No results for input(s): CKTOTAL, CKMB, CKMBINDEX, TROPONINI in the last 168 hours.  BNP (last 3 results) No results for input(s): BNP in the last 8760 hours.  ProBNP (last 3 results) Recent Labs    12/05/21 0915  PROBNP 6,343*    CBG: Recent Labs  Lab 12/13/21 0705  GLUCAP 112*    Radiological Exams on Admission: CT HEAD WO CONTRAST (5MM)  Result Date: 12/13/2021 CLINICAL DATA:  Head trauma, moderate-severe EXAM: CT HEAD WITHOUT CONTRAST TECHNIQUE: Contiguous axial images were obtained from the base of the skull through the vertex without intravenous contrast. RADIATION DOSE REDUCTION: This exam was performed according to the departmental dose-optimization program which includes automated exposure control, adjustment of the mA and/or kV according to patient size and/or use of iterative reconstruction technique. COMPARISON:  10/01/2021 FINDINGS: Brain: There is no acute intracranial hemorrhage, significant mass effect, or edema. Gray-white differentiation is preserved. There is no extra-axial fluid collection. Prominence of the ventricles and sulci reflects similar parenchymal volume loss. Patchy low-density in the supratentorial white matter is nonspecific but may reflect stable chronic microvascular ischemic changes. Similar prominence of the extra-axial space along the right cerebral convexity that could reflect a thin subdural hygroma. Vascular: There is atherosclerotic calcification at the skull base. Skull: Calvarium is unremarkable. Sinuses/Orbits:  No acute finding. Other: None. IMPRESSION: No evidence of acute intracranial injury. 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 Electronically Signed   By: Macy Mis M.D.   On: 12/13/2021 08:26   CT Cervical Spine Wo Contrast  Result Date: 12/13/2021 CLINICAL DATA:  Neck trauma (Age >= 65y) EXAM: CT CERVICAL SPINE WITHOUT CONTRAST TECHNIQUE: Multidetector CT imaging of the cervical spine was performed without intravenous contrast. Multiplanar CT image reconstructions were also generated. RADIATION DOSE REDUCTION: This exam was performed according to the departmental dose-optimization program which includes automated exposure control, adjustment of the mA and/or kV according to patient size and/or use of iterative reconstruction technique. COMPARISON:  None. FINDINGS: Alignment: Mild degenerative listhesis. Skull base and vertebrae: Degenerative endplate irregularity. No acute fracture. Soft tissues and spinal canal: No prevertebral fluid or swelling. No visible canal hematoma. Disc levels: Multilevel degenerative changes are present including disc space narrowing, endplate osteophytes, and facet and uncovertebral hypertrophy. Upper chest: No apical lung mass. Other: Atherosclerosis including significant calcification at the common carotid bifurcations. IMPRESSION: No acute cervical spine fracture. Electronically Signed   By: Macy Mis M.D.   On: 12/13/2021 08:33   DG Chest Portable 1 View  Result Date: 12/13/2021 CLINICAL DATA:  Shortness of breath in an 88 year old male. EXAM: PORTABLE CHEST 1 VIEW COMPARISON:  October 01, 2021. FINDINGS: EKG leads project over the chest. Trachea is midline. Cardiomediastinal contours mild cardiac enlargement unchanged compared to recent imaging. Mild interstitial prominence is noted without frank signs of pulmonary edema. No lobar consolidation or sign of pleural fluid. No pneumothorax. On limited assessment there is no acute skeletal process.  Marked LEFT glenohumeral degenerative changes are noted. IMPRESSION: Stable mild cardiac enlargement. Mild interstitial prominence slightly asymmetric could reflect changes of early CHF or atypical infection. Electronically Signed   By: Zetta Bills M.D.   On: 12/13/2021 11:50    EKG: I independently viewed the EKG done and my findings are as followed: Atrial fibrillation rate of 87 with PVCs.  Assessment/Plan Present on Admission: **None**  Principal Problem:   Weakness  Generalized weakness, multifactorial secondary to symptomatic anemia, AKI, acute illness. Treat underlying conditions. Presented with hemoglobin of 6.5 and positive FOBT, 1 unit PRBC ordered to be transfused in the ED. Repeat CBC posttransfusion Continue IV Protonix 40 mg twice daily started in the ED. Hold off any anticoagulation or antiplatelets for now. GI consulted by EDP. PT OT to assess from 12/14/2021 Obtain orthostatic vital signs on 12/14/2021 Fall precautions.  Acute blood loss anemia with suspected upper GI bleed with history of gastric ulcers seen on EGD done on 10/03/2021 by Dr. Watt Climes. Maintain hemoglobin above 8 due to underlying cardiac history. Obtain 2 large-bore IV 20 Gauge Transfuse PRBCs as indicated.  AKI on CKD 3B Baseline creatinine appears to be 1.5 with GFR of 44. Presented with creatinine of 1.98 with GFR of 32 Avoid nephrotoxic agents, and hypotension. Monitor urine output with strict I's and O's Repeat renal panel in the morning.  Iron deficiency anemia Last iron studies done on 10/04/2021 showed severe iron deficiency Hold off oral iron replacement for now due to possible EGD. Resume oral iron supplement when okay with GI.  Permanent atrial fibrillation, not on oral anticoagulation, currently rate controlled. Hold off oral antihypertensives to avoid hypotension in the setting of suspected GI bleed. Continue to closely monitor on telemetry.  Chronic diastolic CHF Last 2D echo  done on 11/11/2021 revealed LVEF 60 to 65% with no regional wall motion abnormalities.  Left atrial size and right atrial size was severely dilated.  There was severely elevated pulmonary artery systolic pressure. Start strict I's and O's and daily weight. Hold off home beta-blocker and ACE inhibitor due to soft BPs to avoid hypotension in the setting of GI bleed.  BPH Hold off home Cardura for now to avoid hypotension.  Hyperlipidemia Hold off Crestor for now.  Type 2 diabetes with hyperglycemia Obtain hemoglobin A1c N.p.o. until seen by GI. Start insulin sliding scale, avoid hypoglycemia.  Recurrent fall Per ED staff, family reports frequent falls at home PT OT assessment on 12/14/2021. Orthostatic vital signs on 12/14/2021. Fall precautions.   Critical care time: 65 minutes.   DVT prophylaxis: SCDs  Code Status: Full code  Family Communication: None at bedside  Disposition Plan: Likely will discharge to home with home health services once GI signs off.  Consults called: GI consulted by EDP.  Admission status: Inpatient status.   Status is: Inpatient  Patient requires at least 2 midnights for further evaluation and treatment of present condition.     Kayleen Memos MD [facility] Pager [phone]  If 7PM-7AM, please contact night-coverage [email or web address] Password [facility]  12/13/2021, 12:17 PM

## 2021-12-13 NOTE — ED Notes (Signed)
On initial assessment pt c/o SOB, resp labored with some accessory muscle use.  Monitor alarming ST elevation with PVCs.  BP 84/59, HR 104. ED MD shown new EKG and advised this RN to page hospitalist. Dr. Myna Hidalgo paged.

## 2021-12-13 NOTE — ED Provider Notes (Signed)
[facility] EMERGENCY DEPARTMENT Provider Note   CSN: [id number] Arrival date & time: 12/13/21  3154     History  Chief Complaint  Patient presents with   Fall   Weakness    [name] is a 88 y.o. male.   Fall  Weakness Patient is an 88 year old gentleman with past medical history significant for bleeding ulcers/PUD, history of chronic A-fib no longer on anticoagulation due to frequent falls and history of intracranial hemorrhage during a fall in the past  Patient is present emergency room today for weakness fatigue and increased falls.  Seem like he had an episode last night where he fell he states that he felt very weak.  He did strike his head against the ground.  He has an abrasion here he states he does not recall whether he passed out or not.  He denies any nausea vomiting diarrhea states that he occasionally has loose stool.  He states that his stool has been dark black for the past 1 week.  Also states he is having trouble urinating.     Home Medications Prior to Admission medications   Medication Sig Start Date End Date Taking? Authorizing Provider  ACCU-CHEK AVIVA PLUS test strip  05/23/18   [provider]  ACCU-CHEK SOFTCLIX LANCETS lancets daily. use as directed 04/07/18   [provider]  Acetaminophen (TYLENOL ARTHRITIS PAIN PO) Take 650 mg by mouth daily with breakfast.    [provider]  Amino Acids-Protein Hydrolys (FEEDING SUPPLEMENT, PRO-STAT SUGAR FREE 64,) LIQD Take 30 mLs by mouth 2 (two) times daily. Patient not taking: Reported on 11/08/2021    [provider]  aspirin EC 81 MG tablet Take 1 tablet (81 mg total) by mouth daily. Swallow whole. 11/19/21   [name], MD  B Complex Vitamins (B COMPLEX 50) TABS Take 1 tablet by mouth daily with breakfast.    [provider]  bisacodyl (DULCOLAX) 10 MG suppository Place rectally as needed for moderate constipation. If not relieved  by MOM, give 10 mg Bisacodyl suppositiory in 24 hours as needed Patient not taking: Reported on 11/08/2021    [provider]  carvedilol (COREG) 12.5 MG tablet Take 12.5 mg by mouth 2 (two) times daily. 11/08/21   [provider]  cholecalciferol (VITAMIN D3) 25 MCG (1000 UNIT) tablet Take 1,000 Units by mouth 2 (two) times daily.    [provider]  dapagliflozin propanediol (FARXIGA) 10 MG TABS tablet Take 1 tablet (10 mg total) by mouth daily before breakfast. 10/23/21   Medina-Vargas, Monina C, NP  doxazosin (CARDURA) 8 MG tablet Take 1 tablet (8 mg total) by mouth daily. 10/23/21   Medina-Vargas, Monina C, NP  enzalutamide (XTANDI) 40 MG capsule Take 4 capsules (160 mg total) by mouth daily at 2 PM. 10/23/21   Medina-Vargas, Monina C, NP  ferrous sulfate 325 (65 FE) MG tablet Take 325 mg by mouth See admin instructions. Take one tablet (325 mg) by mouth twice daily - with lunch and supper    [provider]  gabapentin (NEURONTIN) 100 MG capsule Take 1 capsule (100 mg total) by mouth at bedtime. 10/23/21   Medina-Vargas, Monina C, NP  lisinopril (ZESTRIL) 20 MG tablet Take 1 tablet (20 mg total) by mouth daily. 10/23/21   Medina-Vargas, Monina C, NP  magnesium hydroxide (MILK OF MAGNESIA) 400 MG/5ML suspension Take by mouth daily as needed for mild constipation. If no BM in 3 days, give 30 cc Milk  of Magnesium in 24 hours as needed Patient not taking: Reported on 11/08/2021    [provider]  metFORMIN (GLUCOPHAGE) 1000 MG tablet Take 1 tablet (1,000 mg total) by mouth 2 (two) times daily with breakfast and lunch. 10/23/21   Medina-Vargas, Monina C, NP  metoprolol succinate (TOPROL-XL) 50 MG 24 hr tablet Take 1 tablet (50 mg total) by mouth daily. Take with or immediately following a meal. 10/23/21   Medina-Vargas, Monina C, NP  pantoprazole (PROTONIX) 40 MG tablet Take 1 tablet (40 mg total) by mouth daily. 10/23/21   Medina-Vargas, Monina C, NP   rosuvastatin (CRESTOR) 5 MG tablet TAKE 1 TABLET BY MOUTH ONCE DAILY AT  6PM 10/23/21   Medina-Vargas, Monina C, NP  Selenium 200 MCG CAPS Take 200 mcg by mouth daily with breakfast.    [provider]  simethicone (MYLICON) 80 MG chewable tablet Chew 1 tablet (80 mg total) by mouth 4 (four) times daily as needed for flatulence. Patient not taking: Reported on 11/08/2021 10/08/21   Lavina Hamman, MD  Sodium Phosphates (RA SALINE ENEMA RE) If not relieved by Biscodyl suppository, give disposable Saline Enema rectally x1 dose 24 hrs as needed Patient not taking: Reported on 11/08/2021    [provider]  torsemide (DEMADEX) 20 MG tablet Take 2 tablets (40 mg total) by mouth 2 (two) times daily. 11/23/21 02/21/22  [name], MD  verapamil (CALAN-SR) 180 MG CR tablet Take 180 mg by mouth daily. 11/08/21   [provider]  Vitamin A 2400 MCG (8000 UT) TABS Take 2,400 mcg by mouth daily with breakfast.    [provider]  vitamin B-12 (CYANOCOBALAMIN) 1000 MCG tablet Take 1,000 mcg by mouth daily.    [provider]  vitamin C (ASCORBIC ACID) 500 MG tablet Take 500 mg by mouth 2 (two) times daily.    [provider]  zinc gluconate 50 MG tablet Take 50 mg by mouth daily with breakfast.    [provider]      Allergies    Amoxicillin    Review of Systems   Review of Systems  Neurological:  Positive for weakness.   Physical Exam Updated Vital Signs BP (!) 93/57    Pulse (!) 58    Temp 97.7 F (36.5 C) (Oral)    Resp 13    SpO2 98%  Physical Exam Vitals and nursing note reviewed.  Constitutional:      General: He is not in acute distress. HENT:     Head: Normocephalic.     Comments: Abrasion and small contusion to left frontal forehead    Nose: Nose normal.     Mouth/Throat:     Mouth: Mucous membranes are dry.  Eyes:     General: No scleral icterus. Neck:     Comments: Mild midline C-spine  tenderness Cardiovascular:     Rate and Rhythm: Normal rate and regular rhythm.     Pulses: Normal pulses.     Heart sounds: Normal heart sounds.  Pulmonary:     Effort: Pulmonary effort is normal. No respiratory distress.     Breath sounds: No wheezing.  Abdominal:     Palpations: Abdomen is soft.     Tenderness: There is no abdominal tenderness. There is no guarding or rebound.  Genitourinary:    Comments: Normal testes. Patient with wet diaper Musculoskeletal:     Cervical back: Normal range of motion.     Right lower leg: No edema.  Left lower leg: No edema.     Comments: No upper or lower extremity tenderness palpation.  No T or L-spine tenderness.  Skin:    General: Skin is warm and dry.     Capillary Refill: Capillary refill takes less than 2 seconds.  Neurological:     Mental Status: He is alert. Mental status is at baseline.     Comments: Alert and oriented x3, follows commands  Sensation intact in all 4 extremities mild symmetric  Psychiatric:        Mood and Affect: Mood normal.        Behavior: Behavior normal.    ED Results / Procedures / Treatments   Labs (all labs ordered are listed, but only abnormal results are displayed) Labs Reviewed  CBC WITH DIFFERENTIAL/PLATELET - Abnormal; Notable for the following components:      Result Value   RBC 2.11 (*)    Hemoglobin 6.5 (*)    HCT 20.9 (*)    RDW 16.6 (*)    All other components within normal limits  COMPREHENSIVE METABOLIC PANEL - Abnormal; Notable for the following components:   Glucose, Bld 129 (*)    BUN 82 (*)    Creatinine, Ser 1.98 (*)    Total Protein 5.7 (*)    Albumin 3.1 (*)    GFR, Estimated 32 (*)    All other components within normal limits  CBG MONITORING, ED - Abnormal; Notable for the following components:   Glucose-Capillary 112 (*)    All other components within normal limits  POC OCCULT BLOOD, ED - Abnormal; Notable for the following components:   Fecal Occult Bld POSITIVE (*)     All other components within normal limits  RESP PANEL BY RT-PCR (FLU A&B, COVID) ARPGX2  TSH  URINALYSIS, ROUTINE W REFLEX MICROSCOPIC  PROTIME-INR  BRAIN NATRIURETIC PEPTIDE  TYPE AND SCREEN  PREPARE RBC (CROSSMATCH)    EKG EKG Interpretation  Date/Time:  Thursday December 13 2021 07:35:39 EST Ventricular Rate:  87 PR Interval:    QRS Duration: 110 QT Interval:  380 QTC Calculation: 457 R Axis:   -54 Text Interpretation: Atrial fibrillation with premature ventricular or aberrantly conducted complexes Left anterior fascicular block Cannot rule out Anterior infarct , age undetermined ST & T wave abnormality, consider lateral ischemia Abnormal ECG When compared with ECG of 01-Oct-2021 18:32, PREVIOUS ECG IS PRESENT since last tracing no significant change Confirmed by Malvin Johns [phone]) on 12/13/2021 11:20:53 AM  Radiology CT HEAD WO CONTRAST (5MM)  Result Date: 12/13/2021 CLINICAL DATA:  Head trauma, moderate-severe EXAM: CT HEAD WITHOUT CONTRAST TECHNIQUE: Contiguous axial images were obtained from the base of the skull through the vertex without intravenous contrast. RADIATION DOSE REDUCTION: This exam was performed according to the departmental dose-optimization program which includes automated exposure control, adjustment of the mA and/or kV according to patient size and/or use of iterative reconstruction technique. COMPARISON:  10/01/2021 FINDINGS: Brain: There is no acute intracranial hemorrhage, significant mass effect, or edema. Gray-white differentiation is preserved. There is no extra-axial fluid collection. Prominence of the ventricles and sulci reflects similar parenchymal volume loss. Patchy low-density in the supratentorial white matter is nonspecific but may reflect stable chronic microvascular ischemic changes. Similar prominence of the extra-axial space along the right cerebral convexity that could reflect a thin subdural hygroma. Vascular: There is atherosclerotic  calcification at the skull base. Skull: Calvarium is unremarkable. Sinuses/Orbits: No acute finding. Other: None. IMPRESSION: No evidence of acute intracranial injury. 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 Electronically Signed   By: Macy Mis M.D.   On: 12/13/2021 08:26   CT Cervical Spine Wo Contrast  Result Date: 12/13/2021 CLINICAL DATA:  Neck trauma (Age >= 65y) EXAM: CT CERVICAL SPINE WITHOUT CONTRAST TECHNIQUE: Multidetector CT imaging of the cervical spine was performed without intravenous contrast. Multiplanar CT image reconstructions were also generated. RADIATION DOSE REDUCTION: This exam was performed according to the departmental dose-optimization program which includes automated exposure control, adjustment of the mA and/or kV according to patient size and/or use of iterative reconstruction technique. COMPARISON:  None. FINDINGS: Alignment: Mild degenerative listhesis. Skull base and vertebrae: Degenerative endplate irregularity. No acute fracture. Soft tissues and spinal canal: No prevertebral fluid or swelling. No visible canal hematoma. Disc levels: Multilevel degenerative changes are present including disc space narrowing, endplate osteophytes, and facet and uncovertebral hypertrophy. Upper chest: No apical lung mass. Other: Atherosclerosis including significant calcification at the common carotid bifurcations. IMPRESSION: No acute cervical spine fracture. Electronically Signed   By: Macy Mis M.D.   On: 12/13/2021 08:33    Procedures .Critical Care Performed by: Tedd Sias, PA Authorized by: Tedd Sias, PA   Critical care provider statement:    Critical care time (minutes):  35   Critical care time was exclusive of:  Separately billable procedures and treating other patients and teaching time   Critical care was necessary to treat or prevent imminent or life-threatening deterioration of the following conditions: Symptomatic anemia requiring  blood transfusion.   Critical care was time spent personally by me on the following activities:  Development of treatment plan with patient or surrogate, review of old charts, re-evaluation of patient's condition, pulse oximetry, ordering and review of radiographic studies, ordering and review of laboratory studies, ordering and performing treatments and interventions, obtaining history from patient or surrogate, examination of patient and evaluation of patient's response to treatment   Care discussed with: admitting provider      Medications Ordered in ED Medications  0.9 %  sodium chloride infusion (has no administration in time range)  sodium chloride 0.9 % bolus 500 mL (has no administration in time range)    ED Course/ Medical Decision Making/ A&P Clinical Course as of 12/13/21 1411  Thu Dec 13, 2021  1103 Anemia + AKI Admit [WF]  1113 CT head reviewed IMPRESSION: No evidence of acute intracranial injury. 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   [WF]  1113 C spine wo fracture [WF]  1114 Hemoglobin(!!): 6.5 [WF]  1146 Discussed with Dr. Michail Sermon who is aware of patient and will see in consultation. [WF]    Clinical Course User Index [WF] Tedd Sias, PA                           Medical Decision Making Amount and/or Complexity of Data Reviewed Labs: ordered. Decision-making details documented in ED Course. Radiology: ordered.  Risk Prescription drug management. Decision regarding hospitalization.   This patient presents to the ED for concern of fall, weakness, this involves a number of treatment options, and is a complaint that carries with it a high risk of complications and morbidity.  The differential diagnosis includes considered atypical ACS, anemia, hypoglycemia, stroke, dehydration   Co morbidities: Discussed in HPI   Brief History:  Patient is an 88 year old gentleman with past medical history significant for bleeding ulcers and  severe anemia requiring transfusion in the past most recently was hospitalized 11/28 for  similar presentation today however he fell and struck his head discussed with his daughter who is at bedside seem to follow his x-ray last night.  Seems he has been more weak and falling more often.  Cannot currently on anticoagulation.  Will obtain CT head  EMR reviewed including pt PMHx, past surgical history and past visits to ER.   See HPI for more details   Lab Tests:  I ordered and independently interpreted labs.  The pertinent results include:    Labs notable for anemia significant with hemoglobin of 6.5 is likely explains patient's severe fatigue and weakness.  CMP notable for mild AKI with creatinine elevation over the past 3 weeks from 1.5 to almost 2.  BUN elevated likely due to upper GI bleed will start on IV PPI twice daily.  Mild hyperglycemia.  Melena on exam and occult positive.  Urinalysis without evidence of infection.  Patient does describe some urinary retention however urinated while I was examining him.   Imaging Studies:  NAD. I personally reviewed all imaging studies and no acute abnormality found. I agree with radiology interpretation. CT head without cranial hemorrhage or acute abnormality.  CT C-spine unremarkable. I reviewed chest x-ray.  Some haziness indicative of perhaps some mild pulmonary edema.  Patient may benefit from diuresis during transfusion however will leave this to oncoming team.  IMPRESSION:  Stable mild cardiac enlargement. Mild interstitial prominence  slightly asymmetric could reflect changes of early CHF or atypical  infection.       Cardiac Monitoring:  The patient was maintained on a cardiac monitor.  I personally viewed and interpreted the cardiac monitored which showed an underlying rhythm of: A-fib this is chronic EKG non-ischemic   Medicines ordered:  I ordered medication including PRBC, gentle hydration for weakness, borderline  hypotension Reevaluation of the patient after these medicines showed that the patient improved I have reviewed the patients home medicines and have made adjustments as needed   Critical Interventions:  PRBC transfusion Admission   Consults:  I requested consultation with gastroenterology, Dr. Michail Sermon,  and discussed lab and imaging findings as well as pertinent plan - they recommend: Admission.  Discussed with hospitalist who will admit.    Reevaluation:  After the interventions noted above I re-evaluated patient and found that they have :stayed the same   Social Determinants of Health:  The patient's social determinants of health were a factor in the care of this patient    Problem List / ED Course:  GI bleed, anemia, fatigue, fall   Dispostion:  After consideration of the diagnostic results and the patients response to treatment, I feel that the patent would benefit from admission, blood transfusion    Final Clinical Impression(s) / ED Diagnoses Final diagnoses:  Weakness  Fall, initial encounter  Anemia, unspecified type  Gastrointestinal hemorrhage, unspecified gastrointestinal hemorrhage type    Rx / DC Orders ED Discharge Orders     None         Tedd Sias, [place] 12/13/21 1453    Malvin Johns, MD 12/13/21 1456

## 2021-12-13 NOTE — ED Notes (Signed)
Transfusion initiated. Dr Nevada Crane at bedside rounding on pt. Pt noted to be in a fib with occasional short runs of v tach on monitor, RN updated Dr Nevada Crane on same. Pt alert and oriented, resting in stretcher in no acute distress. Blood infuses without complication. Primofit in place. Pt call bell in reach.

## 2021-12-13 NOTE — ED Notes (Signed)
Pt tolerated blood transfusion without complication

## 2021-12-13 NOTE — Consult Note (Signed)
Referring Provider: [facility] Primary Care Physician:  [name], MD Primary Gastroenterologist:  [facility] (Dr. Watt Climes)  Reason for Consultation:  GI bleed, anemia  HPI: SHAQUIL ALDANA is a 88 y.o. male with medical history significant for coronary artery calcification, severe aortic stenosis status post TAVR, HFpEF, moderate mitral regurgitation, moderate tricuspid regurgitation, type 2 diabetes, essential hypertension, persistent atrial fibrillation not on oral anticoagulation, peripheral artery disease, CKD 3B, prior GI bleed with gastric ulcers who presents for evaluation of upper GI bleed.  Patient originally presented to ED with weakness and a fall, though reports melena x 1 week. Hgb 6.5 on arrival with positive FOBT.  Patient had recent hospital admission 11/28-12/05 for Acute GI bleed with anemia. Received 2 PRBC transfusions and underwent EGD 11/30 which showed non bleeding gastric ulcers.  Patient sleeping in bed, easily aroused. He states he has been feeling dizzy for the last week and has also noticed dark tarry stools for the last week, last bowel movement 2 days ago.  Patient fell today, states it was an accident as he was reaching over to grab something.  Does not remember if he passed out.  Patient reports he has been taking his PPI as instructed since his upper endoscopy in November. Denies NSAIDs, tobacco, alcohol.  He reports central abdominal pain that occurs after eating and resolves in 1 to 2 hours for the last week or 2.  Last colonoscopy in 2013 with Dr. 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   Past Medical History:  Diagnosis Date   Anemia    LOW PLATELETS OTHER DAY  PER PT   Anticoagulated on Coumadin 12/21/2012   Aortic stenosis    Arthritis    "left wrist; back sometimes" (01/21/2013)   Atrial fibrillation ([place])    [name], DR COOPER   Bradycardia    Exertional shortness of breath    Heart murmur     "I've had it for years; runs in the family on daddy's side" (01/21/2013)   HTN (hypertension)    Hyperlipemia 12/21/2012   Prostate cancer ([place])    "had 40 tx of radiation in 2009" (01/21/2013)   S/P aortic valve replacement with bioprosthetic valve 01/19/2013   Transcatheter Aortic Valve Replacement using 12mm Sapien bioprosthetic tissue valve via transapical approach   Type II diabetes mellitus ([place])     Past Surgical History:  Procedure Laterality Date   BALLOON DILATION N/A 08/02/2020   Procedure: BALLOON DILATION;  Surgeon: [name], MD;  Location: [facility];  Service: Gastroenterology;  Laterality: N/A;   BIOPSY  10/03/2021   Procedure: BIOPSY;  Surgeon: [name], MD;  Location: [facility];  Service: Endoscopy;;   CARDIAC CATHETERIZATION  12/16/2012   CARDIAC VALVE REPLACEMENT  01/19/2013   AVR   CATARACT EXTRACTION W/ INTRAOCULAR LENS  IMPLANT, BILATERAL     ESOPHAGOGASTRODUODENOSCOPY N/A 10/03/2021   Procedure: ESOPHAGOGASTRODUODENOSCOPY (EGD);  Surgeon: [name], MD;  Location: [facility];  Service: Endoscopy;  Laterality: N/A;   ESOPHAGOGASTRODUODENOSCOPY (EGD) WITH PROPOFOL N/A 08/02/2020   Procedure: ESOPHAGOGASTRODUODENOSCOPY (EGD) WITH PROPOFOL;  Surgeon: [name], MD;  Location: [facility];  Service: Gastroenterology;  Laterality: N/A;   EYE SURGERY     INTRAOPERATIVE TRANSESOPHAGEAL ECHOCARDIOGRAM N/A 01/19/2013   Procedure: INTRAOPERATIVE TRANSESOPHAGEAL ECHOCARDIOGRAM;  Surgeon: [name], MD;  Location: [place];  Service: Open Heart Surgery;  Laterality: N/A;   ORIF FOREARM FRACTURE Left 1952   "compound fx" (01/21/2013)   TRANSTHORACIC ECHOCARDIOGRAM  09/04/10, 09/07/08    Prior to Admission medications   Medication Sig Start Date End Date Taking? Authorizing Provider  ACCU-CHEK AVIVA PLUS test strip  05/23/18   [provider]  ACCU-CHEK SOFTCLIX LANCETS lancets daily. use as directed 04/07/18   [provider]  Acetaminophen  (TYLENOL ARTHRITIS PAIN PO) Take 650 mg by mouth daily with breakfast.    [provider]  Amino Acids-Protein Hydrolys (FEEDING SUPPLEMENT, PRO-STAT SUGAR FREE 64,) LIQD Take 30 mLs by mouth 2 (two) times daily. Patient not taking: Reported on 11/08/2021    [provider]  aspirin EC 81 MG tablet Take 1 tablet (81 mg total) by mouth daily. Swallow whole. 11/19/21   [name], MD  B Complex Vitamins (B COMPLEX 50) TABS Take 1 tablet by mouth daily with breakfast.    [provider]  bisacodyl (DULCOLAX) 10 MG suppository Place rectally as needed for moderate constipation. If not relieved by MOM, give 10 mg Bisacodyl suppositiory in 24 hours as needed Patient not taking: Reported on 11/08/2021    [provider]  carvedilol (COREG) 12.5 MG tablet Take 12.5 mg by mouth 2 (two) times daily. 11/08/21   [provider]  cholecalciferol (VITAMIN D3) 25 MCG (1000 UNIT) tablet Take 1,000 Units by mouth 2 (two) times daily.    [provider]  dapagliflozin propanediol (FARXIGA) 10 MG TABS tablet Take 1 tablet (10 mg total) by mouth daily before breakfast. 10/23/21   Medina-Vargas, Monina C, NP  doxazosin (CARDURA) 8 MG tablet Take 1 tablet (8 mg total) by mouth daily. 10/23/21   Medina-Vargas, Monina C, NP  enzalutamide (XTANDI) 40 MG capsule Take 4 capsules (160 mg total) by mouth daily at 2 PM. 10/23/21   Medina-Vargas, Monina C, NP  ferrous sulfate 325 (65 FE) MG tablet Take 325 mg by mouth See admin instructions. Take one tablet (325 mg) by mouth twice daily - with lunch and supper    [provider]  gabapentin (NEURONTIN) 100 MG capsule Take 1 capsule (100 mg total) by mouth at bedtime. 10/23/21   Medina-Vargas, Monina C, NP  lisinopril (ZESTRIL) 20 MG tablet Take 1 tablet (20 mg total) by mouth daily. 10/23/21   Medina-Vargas, Monina C, NP  magnesium hydroxide (MILK OF MAGNESIA) 400 MG/5ML suspension Take by mouth daily as needed  for mild constipation. If no BM in 3 days, give 30 cc Milk of Magnesium in 24 hours as needed Patient not taking: Reported on 11/08/2021    [provider]  metFORMIN (GLUCOPHAGE) 1000 MG tablet Take 1 tablet (1,000 mg total) by mouth 2 (two) times daily with breakfast and lunch. 10/23/21   Medina-Vargas, Monina C, NP  metoprolol succinate (TOPROL-XL) 50 MG 24 hr tablet Take 1 tablet (50 mg total) by mouth daily. Take with or immediately following a meal. 10/23/21   Medina-Vargas, Monina C, NP  pantoprazole (PROTONIX) 40 MG tablet Take 1 tablet (40 mg total) by mouth daily. 10/23/21   Medina-Vargas, Monina C, NP  rosuvastatin (CRESTOR) 5 MG tablet TAKE 1 TABLET BY MOUTH ONCE DAILY AT  6PM 10/23/21   Medina-Vargas, Monina C, NP  Selenium 200 MCG CAPS Take 200 mcg by mouth daily with breakfast.    [provider]  simethicone (MYLICON) 80 MG chewable tablet Chew 1 tablet (80 mg total) by mouth 4 (four) times daily as needed for flatulence. Patient not taking: Reported on 11/08/2021 10/08/21   Lavina Hamman, MD  Sodium Phosphates (RA SALINE  ENEMA RE) If not relieved by Biscodyl suppository, give disposable Saline Enema rectally x1 dose 24 hrs as needed Patient not taking: Reported on 11/08/2021    [provider]  torsemide (DEMADEX) 20 MG tablet Take 2 tablets (40 mg total) by mouth 2 (two) times daily. 11/23/21 02/21/22  [name], MD  verapamil (CALAN-SR) 180 MG CR tablet Take 180 mg by mouth daily. 11/08/21   [provider]  Vitamin A 2400 MCG (8000 UT) TABS Take 2,400 mcg by mouth daily with breakfast.    [provider]  vitamin B-12 (CYANOCOBALAMIN) 1000 MCG tablet Take 1,000 mcg by mouth daily.    [provider]  vitamin C (ASCORBIC ACID) 500 MG tablet Take 500 mg by mouth 2 (two) times daily.    [provider]  zinc gluconate 50 MG tablet Take 50 mg by mouth daily with breakfast.    [provider]    Scheduled  Meds:  [START ON 12/14/2021] cholecalciferol  1,000 Units Oral Daily   gabapentin  100 mg Oral QHS   pantoprazole (PROTONIX) IV  40 mg Intravenous Q12H   [START ON 12/14/2021] vitamin B-12  1,000 mcg Oral Daily   Continuous Infusions:  sodium chloride     PRN Meds:.  Allergies as of 12/13/2021 - Review Complete 12/13/2021  Allergen Reaction Noted   Amoxicillin Other (See Comments) 05/25/2020    Family History  Problem Relation Age of Onset   Diabetes Mother    Melanoma Mother 44   Bone cancer Mother 86   Pancreatic cancer Father 30   Heart disease Father    Stroke Sister    Cancer Brother 88       ? brain cancer    Social History   Socioeconomic History   Marital status: Married    Spouse name: Not on file   Number of children: Not on file   Years of education: Not on file   Highest education level: Not on file  Occupational History   Not on file  Tobacco Use   Smoking status: Former    Packs/day: 2.00    Years: 22.00    Pack years: 44.00    Types: Cigarettes    Quit date: 09/11/1976    Years since quitting: 45.2   Smokeless tobacco: Never  Vaping Use   Vaping Use: Never used  Substance and Sexual Activity   Alcohol use: No   Drug use: No   Sexual activity: Never  Other Topics Concern   Not on file  Social History Narrative   Not on file   Social Determinants of Health   Financial Resource Strain: Not on file  Food Insecurity: Not on file  Transportation Needs: Not on file  Physical Activity: Not on file  Stress: Not on file  Social Connections: Not on file  Intimate Partner Violence: Not on file    Review of Systems: Review of Systems  Constitutional:  Negative for chills and fever.  HENT:  Negative for ear pain and sore throat.   Eyes:  Negative for discharge and redness.  Respiratory:  Negative for cough and hemoptysis.   Cardiovascular:  Negative for chest pain and leg swelling.  Gastrointestinal:  Positive for abdominal pain (after eating,  resolves in 1-2 hours), heartburn and melena. Negative for blood in stool, constipation, diarrhea, nausea and vomiting.  Genitourinary:  Negative for dysuria and hematuria.  Musculoskeletal:  Positive for falls. Negative for joint pain.  Skin:  Negative for itching  and rash.  Neurological:  Positive for dizziness. Loss of consciousness: unsure whether LOC. Endo/Heme/Allergies:  Negative for environmental allergies and polydipsia.  Psychiatric/Behavioral:  Negative for substance abuse. The patient is not nervous/anxious.     Physical Exam: Physical Exam Constitutional:      General: He is not in acute distress. HENT:     Head:     Comments: Abrasion and small contusion to left frontal forehead    Right Ear: External ear normal.     Left Ear: External ear normal.     Nose: Nose normal.     Mouth/Throat:     Mouth: Mucous membranes are moist.     Pharynx: Oropharynx is clear.  Eyes:     Extraocular Movements: Extraocular movements intact.     Comments: Conjunctival pallor  Cardiovascular:     Rate and Rhythm: Normal rate and regular rhythm.     Pulses: Normal pulses.     Heart sounds: Normal heart sounds.  Pulmonary:     Effort: Pulmonary effort is normal.     Breath sounds: Normal breath sounds.  Abdominal:     General: Bowel sounds are normal. There is no distension.     Palpations: Abdomen is soft.     Tenderness: There is no abdominal tenderness.  Musculoskeletal:     Cervical back: Neck supple. No rigidity.     Right lower leg: No edema.     Left lower leg: No edema.     Comments: Ecchymosis on bilateral upper extremities.  Skin:    General: Skin is warm and dry.  Neurological:     General: No focal deficit present.     Mental Status: He is alert and oriented to person, place, and time.  Psychiatric:        Mood and Affect: Mood normal.        Behavior: Behavior normal.      Vital signs: Vitals:   12/13/21 1230 12/13/21 1245  BP: (!) 97/57 (!) 103/55  Pulse: 82  90  Resp: 13 16  Temp:    SpO2: 99% 98%        GI:  Lab Results: Recent Labs    12/13/21 0726  WBC 4.8  HGB 6.5*  HCT 20.9*  PLT 208    BMET Recent Labs    12/13/21 0726  NA 139  K 4.8  CL 101  CO2 24  GLUCOSE 129*  BUN 82*  CREATININE 1.98*  CALCIUM 9.0    LFT Recent Labs    12/13/21 0726  PROT 5.7*  ALBUMIN 3.1*  AST 30  ALT 17  ALKPHOS 58  BILITOT 0.7    PT/INR No results for input(s): LABPROT, INR in the last 72 hours.   Studies/Results: CT HEAD WO CONTRAST (5MM)  Result Date: 12/13/2021 CLINICAL DATA:  Head trauma, moderate-severe EXAM: CT HEAD WITHOUT CONTRAST TECHNIQUE: Contiguous axial images were obtained from the base of the skull through the vertex without intravenous contrast. RADIATION DOSE REDUCTION: This exam was performed according to the departmental dose-optimization program which includes automated exposure control, adjustment of the mA and/or kV according to patient size and/or use of iterative reconstruction technique. COMPARISON:  10/01/2021 FINDINGS: Brain: There is no acute intracranial hemorrhage, significant mass effect, or edema. Gray-white differentiation is preserved. There is no extra-axial fluid collection. Prominence of the ventricles and sulci reflects similar parenchymal volume loss. Patchy low-density in the supratentorial white matter is nonspecific but may reflect stable chronic microvascular ischemic changes. Similar prominence  of the extra-axial space along the right cerebral convexity that could reflect a thin subdural hygroma. Vascular: There is atherosclerotic calcification at the skull base. Skull: Calvarium is unremarkable. Sinuses/Orbits: No acute finding. Other: None. IMPRESSION: No evidence of acute intracranial injury. 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 Electronically Signed   By: Macy Mis M.D.   On: 12/13/2021 08:26   CT Cervical Spine Wo Contrast  Result Date: 12/13/2021 CLINICAL  DATA:  Neck trauma (Age >= 65y) EXAM: CT CERVICAL SPINE WITHOUT CONTRAST TECHNIQUE: Multidetector CT imaging of the cervical spine was performed without intravenous contrast. Multiplanar CT image reconstructions were also generated. RADIATION DOSE REDUCTION: This exam was performed according to the departmental dose-optimization program which includes automated exposure control, adjustment of the mA and/or kV according to patient size and/or use of iterative reconstruction technique. COMPARISON:  None. FINDINGS: Alignment: Mild degenerative listhesis. Skull base and vertebrae: Degenerative endplate irregularity. No acute fracture. Soft tissues and spinal canal: No prevertebral fluid or swelling. No visible canal hematoma. Disc levels: Multilevel degenerative changes are present including disc space narrowing, endplate osteophytes, and facet and uncovertebral hypertrophy. Upper chest: No apical lung mass. Other: Atherosclerosis including significant calcification at the common carotid bifurcations. IMPRESSION: No acute cervical spine fracture. Electronically Signed   By: Macy Mis M.D.   On: 12/13/2021 08:33   DG Chest Portable 1 View  Result Date: 12/13/2021 CLINICAL DATA:  Shortness of breath in an 88 year old male. EXAM: PORTABLE CHEST 1 VIEW COMPARISON:  October 01, 2021. FINDINGS: EKG leads project over the chest. Trachea is midline. Cardiomediastinal contours mild cardiac enlargement unchanged compared to recent imaging. Mild interstitial prominence is noted without frank signs of pulmonary edema. No lobar consolidation or sign of pleural fluid. No pneumothorax. On limited assessment there is no acute skeletal process. Marked LEFT glenohumeral degenerative changes are noted. IMPRESSION: Stable mild cardiac enlargement. Mild interstitial prominence slightly asymmetric could reflect changes of early CHF or atypical infection. Electronically Signed   By: Zetta Bills M.D.   On: 12/13/2021 11:50     Impression: GI bleed, anemia - Hgb 6.5 - BUN 82, Cr. 1.98 - No recent abdominal imaging. - Previous EGD 10/03/2021 revealed non-bleeding gastric ulcers.  HFpEF - ECHO 11/21/2021: 60-65%  Plan: Plan for EGD tomorrow. I thoroughly discussed the procedures to include nature, alternatives, benefits, and risks including but not limited to bleeding, perforation, infection, anesthesia/cardiac and pulmonary complications. Patient provides understanding and gave verbal consent to proceed. Continue IV PPI Continue clear liquid diet NPO at midnight PT-INR not yet collected. Will check prior to procedure. If above therapeutic range will postpone EGD. Continue to monitor CBC and transfuse to keep hemoglobin above 7-8. [facility] will follow.     LOS: 0 days   Angelique Holm  PA-C 12/13/2021, 1:09 PM  Contact #  [phone]

## 2021-12-13 NOTE — H&P (View-Only) (Signed)
Referring Provider: [facility] Primary Care Physician:  [name], MD Primary Gastroenterologist:  [facility] (Dr. Watt Climes)  Reason for Consultation:  GI bleed, anemia  HPI: [name] is a 88 y.o. male with medical history significant for coronary artery calcification, severe aortic stenosis status post TAVR, HFpEF, moderate mitral regurgitation, moderate tricuspid regurgitation, type 2 diabetes, essential hypertension, persistent atrial fibrillation not on oral anticoagulation, peripheral artery disease, CKD 3B, prior GI bleed with gastric ulcers who presents for evaluation of upper GI bleed.  Patient originally presented to ED with weakness and a fall, though reports melena x 1 week. Hgb 6.5 on arrival with positive FOBT.  Patient had recent hospital admission 11/28-12/05 for Acute GI bleed with anemia. Received 2 PRBC transfusions and underwent EGD 11/30 which showed non bleeding gastric ulcers.  Patient sleeping in bed, easily aroused. He states he has been feeling dizzy for the last week and has also noticed dark tarry stools for the last week, last bowel movement 2 days ago.  Patient fell today, states it was an accident as he was reaching over to grab something.  Does not remember if he passed out.  Patient reports he has been taking his PPI as instructed since his upper endoscopy in November. Denies NSAIDs, tobacco, alcohol.  He reports central abdominal pain that occurs after eating and resolves in 1 to 2 hours for the last week or 2.  Last colonoscopy in 2013 with Dr. 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   Past Medical History:  Diagnosis Date   Anemia    LOW PLATELETS OTHER DAY  PER PT   Anticoagulated on Coumadin 12/21/2012   Aortic stenosis    Arthritis    "left wrist; back sometimes" (01/21/2013)   Atrial fibrillation ([place])    [name], DR COOPER   Bradycardia    Exertional shortness of breath    Heart murmur     "I've had it for years; runs in the family on daddy's side" (01/21/2013)   HTN (hypertension)    Hyperlipemia 12/21/2012   Prostate cancer ([place])    "had 40 tx of radiation in 2009" (01/21/2013)   S/P aortic valve replacement with bioprosthetic valve 01/19/2013   Transcatheter Aortic Valve Replacement using 11mm Sapien bioprosthetic tissue valve via transapical approach   Type II diabetes mellitus ([place])     Past Surgical History:  Procedure Laterality Date   BALLOON DILATION N/A 08/02/2020   Procedure: BALLOON DILATION;  Surgeon: [name], MD;  Location: [facility];  Service: Gastroenterology;  Laterality: N/A;   BIOPSY  10/03/2021   Procedure: BIOPSY;  Surgeon: [name], MD;  Location: [facility];  Service: Endoscopy;;   CARDIAC CATHETERIZATION  12/16/2012   CARDIAC VALVE REPLACEMENT  01/19/2013   AVR   CATARACT EXTRACTION W/ INTRAOCULAR LENS  IMPLANT, BILATERAL     ESOPHAGOGASTRODUODENOSCOPY N/A 10/03/2021   Procedure: ESOPHAGOGASTRODUODENOSCOPY (EGD);  Surgeon: [name], MD;  Location: [facility];  Service: Endoscopy;  Laterality: N/A;   ESOPHAGOGASTRODUODENOSCOPY (EGD) WITH PROPOFOL N/A 08/02/2020   Procedure: ESOPHAGOGASTRODUODENOSCOPY (EGD) WITH PROPOFOL;  Surgeon: [name], MD;  Location: [facility];  Service: Gastroenterology;  Laterality: N/A;   EYE SURGERY     INTRAOPERATIVE TRANSESOPHAGEAL ECHOCARDIOGRAM N/A 01/19/2013   Procedure: INTRAOPERATIVE TRANSESOPHAGEAL ECHOCARDIOGRAM;  Surgeon: [name], MD;  Location: [facility];  Service: Open Heart Surgery;  Laterality: N/A;   ORIF FOREARM FRACTURE Left 1952   "compound fx" (01/21/2013)   TRANSTHORACIC ECHOCARDIOGRAM  09/04/10, 09/07/08    Prior to Admission medications   Medication Sig Start Date End Date Taking? Authorizing Provider  ACCU-CHEK AVIVA PLUS test strip  05/23/18   [provider]  ACCU-CHEK SOFTCLIX LANCETS lancets daily. use as directed 04/07/18   [provider]  Acetaminophen  (TYLENOL ARTHRITIS PAIN PO) Take 650 mg by mouth daily with breakfast.    [provider]  Amino Acids-Protein Hydrolys (FEEDING SUPPLEMENT, PRO-STAT SUGAR FREE 64,) LIQD Take 30 mLs by mouth 2 (two) times daily. Patient not taking: Reported on 11/08/2021    [provider]  aspirin EC 81 MG tablet Take 1 tablet (81 mg total) by mouth daily. Swallow whole. 11/19/21   [name], MD  B Complex Vitamins (B COMPLEX 50) TABS Take 1 tablet by mouth daily with breakfast.    [provider]  bisacodyl (DULCOLAX) 10 MG suppository Place rectally as needed for moderate constipation. If not relieved by MOM, give 10 mg Bisacodyl suppositiory in 24 hours as needed Patient not taking: Reported on 11/08/2021    [provider]  carvedilol (COREG) 12.5 MG tablet Take 12.5 mg by mouth 2 (two) times daily. 11/08/21   [provider]  cholecalciferol (VITAMIN D3) 25 MCG (1000 UNIT) tablet Take 1,000 Units by mouth 2 (two) times daily.    [provider]  dapagliflozin propanediol (FARXIGA) 10 MG TABS tablet Take 1 tablet (10 mg total) by mouth daily before breakfast. 10/23/21   Medina-Vargas, Monina C, NP  doxazosin (CARDURA) 8 MG tablet Take 1 tablet (8 mg total) by mouth daily. 10/23/21   Medina-Vargas, Monina C, NP  enzalutamide (XTANDI) 40 MG capsule Take 4 capsules (160 mg total) by mouth daily at 2 PM. 10/23/21   Medina-Vargas, Monina C, NP  ferrous sulfate 325 (65 FE) MG tablet Take 325 mg by mouth See admin instructions. Take one tablet (325 mg) by mouth twice daily - with lunch and supper    [provider]  gabapentin (NEURONTIN) 100 MG capsule Take 1 capsule (100 mg total) by mouth at bedtime. 10/23/21   Medina-Vargas, Monina C, NP  lisinopril (ZESTRIL) 20 MG tablet Take 1 tablet (20 mg total) by mouth daily. 10/23/21   Medina-Vargas, Monina C, NP  magnesium hydroxide (MILK OF MAGNESIA) 400 MG/5ML suspension Take by mouth daily as needed  for mild constipation. If no BM in 3 days, give 30 cc Milk of Magnesium in 24 hours as needed Patient not taking: Reported on 11/08/2021    [provider]  metFORMIN (GLUCOPHAGE) 1000 MG tablet Take 1 tablet (1,000 mg total) by mouth 2 (two) times daily with breakfast and lunch. 10/23/21   Medina-Vargas, Monina C, NP  metoprolol succinate (TOPROL-XL) 50 MG 24 hr tablet Take 1 tablet (50 mg total) by mouth daily. Take with or immediately following a meal. 10/23/21   Medina-Vargas, Monina C, NP  pantoprazole (PROTONIX) 40 MG tablet Take 1 tablet (40 mg total) by mouth daily. 10/23/21   Medina-Vargas, Monina C, NP  rosuvastatin (CRESTOR) 5 MG tablet TAKE 1 TABLET BY MOUTH ONCE DAILY AT  6PM 10/23/21   Medina-Vargas, Monina C, NP  Selenium 200 MCG CAPS Take 200 mcg by mouth daily with breakfast.    [provider]  simethicone (MYLICON) 80 MG chewable tablet Chew 1 tablet (80 mg total) by mouth 4 (four) times daily as needed for flatulence. Patient not taking: Reported on 11/08/2021 10/08/21   Lavina Hamman, MD  Sodium Phosphates (RA SALINE  ENEMA RE) If not relieved by Biscodyl suppository, give disposable Saline Enema rectally x1 dose 24 hrs as needed Patient not taking: Reported on 11/08/2021    [provider]  torsemide (DEMADEX) 20 MG tablet Take 2 tablets (40 mg total) by mouth 2 (two) times daily. 11/23/21 02/21/22  [name], MD  verapamil (CALAN-SR) 180 MG CR tablet Take 180 mg by mouth daily. 11/08/21   [provider]  Vitamin A 2400 MCG (8000 UT) TABS Take 2,400 mcg by mouth daily with breakfast.    [provider]  vitamin B-12 (CYANOCOBALAMIN) 1000 MCG tablet Take 1,000 mcg by mouth daily.    [provider]  vitamin C (ASCORBIC ACID) 500 MG tablet Take 500 mg by mouth 2 (two) times daily.    [provider]  zinc gluconate 50 MG tablet Take 50 mg by mouth daily with breakfast.    [provider]    Scheduled  Meds:  [START ON 12/14/2021] cholecalciferol  1,000 Units Oral Daily   gabapentin  100 mg Oral QHS   pantoprazole (PROTONIX) IV  40 mg Intravenous Q12H   [START ON 12/14/2021] vitamin B-12  1,000 mcg Oral Daily   Continuous Infusions:  sodium chloride     PRN Meds:.  Allergies as of 12/13/2021 - Review Complete 12/13/2021  Allergen Reaction Noted   Amoxicillin Other (See Comments) 05/25/2020    Family History  Problem Relation Age of Onset   Diabetes Mother    Melanoma Mother 57   Bone cancer Mother 63   Pancreatic cancer Father 64   Heart disease Father    Stroke Sister    Cancer Brother 46       ? brain cancer    Social History   Socioeconomic History   Marital status: Married    Spouse name: Not on file   Number of children: Not on file   Years of education: Not on file   Highest education level: Not on file  Occupational History   Not on file  Tobacco Use   Smoking status: Former    Packs/day: 2.00    Years: 22.00    Pack years: 44.00    Types: Cigarettes    Quit date: 09/11/1976    Years since quitting: 45.2   Smokeless tobacco: Never  Vaping Use   Vaping Use: Never used  Substance and Sexual Activity   Alcohol use: No   Drug use: No   Sexual activity: Never  Other Topics Concern   Not on file  Social History Narrative   Not on file   Social Determinants of Health   Financial Resource Strain: Not on file  Food Insecurity: Not on file  Transportation Needs: Not on file  Physical Activity: Not on file  Stress: Not on file  Social Connections: Not on file  Intimate Partner Violence: Not on file    Review of Systems: Review of Systems  Constitutional:  Negative for chills and fever.  HENT:  Negative for ear pain and sore throat.   Eyes:  Negative for discharge and redness.  Respiratory:  Negative for cough and hemoptysis.   Cardiovascular:  Negative for chest pain and leg swelling.  Gastrointestinal:  Positive for abdominal pain (after eating,  resolves in 1-2 hours), heartburn and melena. Negative for blood in stool, constipation, diarrhea, nausea and vomiting.  Genitourinary:  Negative for dysuria and hematuria.  Musculoskeletal:  Positive for falls. Negative for joint pain.  Skin:  Negative for itching  and rash.  Neurological:  Positive for dizziness. Loss of consciousness: unsure whether LOC. Endo/Heme/Allergies:  Negative for environmental allergies and polydipsia.  Psychiatric/Behavioral:  Negative for substance abuse. The patient is not nervous/anxious.     Physical Exam: Physical Exam Constitutional:      General: He is not in acute distress. HENT:     Head:     Comments: Abrasion and small contusion to left frontal forehead    Right Ear: External ear normal.     Left Ear: External ear normal.     Nose: Nose normal.     Mouth/Throat:     Mouth: Mucous membranes are moist.     Pharynx: Oropharynx is clear.  Eyes:     Extraocular Movements: Extraocular movements intact.     Comments: Conjunctival pallor  Cardiovascular:     Rate and Rhythm: Normal rate and regular rhythm.     Pulses: Normal pulses.     Heart sounds: Normal heart sounds.  Pulmonary:     Effort: Pulmonary effort is normal.     Breath sounds: Normal breath sounds.  Abdominal:     General: Bowel sounds are normal. There is no distension.     Palpations: Abdomen is soft.     Tenderness: There is no abdominal tenderness.  Musculoskeletal:     Cervical back: Neck supple. No rigidity.     Right lower leg: No edema.     Left lower leg: No edema.     Comments: Ecchymosis on bilateral upper extremities.  Skin:    General: Skin is warm and dry.  Neurological:     General: No focal deficit present.     Mental Status: He is alert and oriented to person, place, and time.  Psychiatric:        Mood and Affect: Mood normal.        Behavior: Behavior normal.      Vital signs: Vitals:   12/13/21 1230 12/13/21 1245  BP: (!) 97/57 (!) 103/55  Pulse: 82  90  Resp: 13 16  Temp:    SpO2: 99% 98%        GI:  Lab Results: Recent Labs    12/13/21 0726  WBC 4.8  HGB 6.5*  HCT 20.9*  PLT 208    BMET Recent Labs    12/13/21 0726  NA 139  K 4.8  CL 101  CO2 24  GLUCOSE 129*  BUN 82*  CREATININE 1.98*  CALCIUM 9.0    LFT Recent Labs    12/13/21 0726  PROT 5.7*  ALBUMIN 3.1*  AST 30  ALT 17  ALKPHOS 58  BILITOT 0.7    PT/INR No results for input(s): LABPROT, INR in the last 72 hours.   Studies/Results: CT HEAD WO CONTRAST (5MM)  Result Date: 12/13/2021 CLINICAL DATA:  Head trauma, moderate-severe EXAM: CT HEAD WITHOUT CONTRAST TECHNIQUE: Contiguous axial images were obtained from the base of the skull through the vertex without intravenous contrast. RADIATION DOSE REDUCTION: This exam was performed according to the departmental dose-optimization program which includes automated exposure control, adjustment of the mA and/or kV according to patient size and/or use of iterative reconstruction technique. COMPARISON:  10/01/2021 FINDINGS: Brain: There is no acute intracranial hemorrhage, significant mass effect, or edema. Gray-white differentiation is preserved. There is no extra-axial fluid collection. Prominence of the ventricles and sulci reflects similar parenchymal volume loss. Patchy low-density in the supratentorial white matter is nonspecific but may reflect stable chronic microvascular ischemic changes. Similar prominence  of the extra-axial space along the right cerebral convexity that could reflect a thin subdural hygroma. Vascular: There is atherosclerotic calcification at the skull base. Skull: Calvarium is unremarkable. Sinuses/Orbits: No acute finding. Other: None. IMPRESSION: No evidence of acute intracranial injury. 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 Electronically Signed   By: Macy Mis M.D.   On: 12/13/2021 08:26   CT Cervical Spine Wo Contrast  Result Date: 12/13/2021 CLINICAL  DATA:  Neck trauma (Age >= 65y) EXAM: CT CERVICAL SPINE WITHOUT CONTRAST TECHNIQUE: Multidetector CT imaging of the cervical spine was performed without intravenous contrast. Multiplanar CT image reconstructions were also generated. RADIATION DOSE REDUCTION: This exam was performed according to the departmental dose-optimization program which includes automated exposure control, adjustment of the mA and/or kV according to patient size and/or use of iterative reconstruction technique. COMPARISON:  None. FINDINGS: Alignment: Mild degenerative listhesis. Skull base and vertebrae: Degenerative endplate irregularity. No acute fracture. Soft tissues and spinal canal: No prevertebral fluid or swelling. No visible canal hematoma. Disc levels: Multilevel degenerative changes are present including disc space narrowing, endplate osteophytes, and facet and uncovertebral hypertrophy. Upper chest: No apical lung mass. Other: Atherosclerosis including significant calcification at the common carotid bifurcations. IMPRESSION: No acute cervical spine fracture. Electronically Signed   By: Macy Mis M.D.   On: 12/13/2021 08:33   DG Chest Portable 1 View  Result Date: 12/13/2021 CLINICAL DATA:  Shortness of breath in an 88 year old male. EXAM: PORTABLE CHEST 1 VIEW COMPARISON:  October 01, 2021. FINDINGS: EKG leads project over the chest. Trachea is midline. Cardiomediastinal contours mild cardiac enlargement unchanged compared to recent imaging. Mild interstitial prominence is noted without frank signs of pulmonary edema. No lobar consolidation or sign of pleural fluid. No pneumothorax. On limited assessment there is no acute skeletal process. Marked LEFT glenohumeral degenerative changes are noted. IMPRESSION: Stable mild cardiac enlargement. Mild interstitial prominence slightly asymmetric could reflect changes of early CHF or atypical infection. Electronically Signed   By: Zetta Bills M.D.   On: 12/13/2021 11:50     Impression: GI bleed, anemia - Hgb 6.5 - BUN 82, Cr. 1.98 - No recent abdominal imaging. - Previous EGD 10/03/2021 revealed non-bleeding gastric ulcers.  HFpEF - ECHO 11/21/2021: 60-65%  Plan: Plan for EGD tomorrow. I thoroughly discussed the procedures to include nature, alternatives, benefits, and risks including but not limited to bleeding, perforation, infection, anesthesia/cardiac and pulmonary complications. Patient provides understanding and gave verbal consent to proceed. Continue IV PPI Continue clear liquid diet NPO at midnight PT-INR not yet collected. Will check prior to procedure. If above therapeutic range will postpone EGD. Continue to monitor CBC and transfuse to keep hemoglobin above 7-8. [facility] will follow.     LOS: 0 days   Angelique Holm  PA-C 12/13/2021, 1:09 PM  Contact #  [phone]

## 2021-12-13 NOTE — ED Provider Triage Note (Signed)
Emergency Medicine Provider Triage Evaluation Note  [name] , a 88 y.o. male  was evaluated in triage.  Pt complains of weakness and more difficulty getting around with walker. Not on blood thinners because of IC hemorrhage during a prior fall. Last night ?syncope and fell and hit head.  No nausea vomiting or diarrhea. Some loose stool.   Also urinary hesitancy ?retention  Review of Systems  Positive: Fall, weakness Negative: Fever   Physical Exam  BP 107/68 (BP Location: Right Arm)    Pulse 95    Temp 98 F (36.7 C) (Oral)    Resp 14    SpO2 98%  Gen:   Awake, no distress   Resp:  Normal effort  MSK:   Moves extremities without difficulty  Other:    Medical Decision Making  Medically screening exam initiated at 7:17 AM.  Appropriate orders placed.  [name] was informed that the remainder of the evaluation will be completed by another provider, this initial triage assessment does not replace that evaluation, and the importance of remaining in the ED until their evaluation is complete.  CT and labs   Tedd Sias, [place] 12/13/21 0100

## 2021-12-13 NOTE — ED Notes (Signed)
[place], PA aware of critical lab, hgb 6.5

## 2021-12-13 NOTE — ED Triage Notes (Signed)
Patient and family member reports patient having frequent falls and weakness over past week. Patient fell last night and hit his head, denies blood thinners. Reports unable to urinate since yesterday and had recent diarrhea.

## 2021-12-13 NOTE — Progress Notes (Addendum)
Cross-coverage note:   Patient was seen for abnormal EKG. Monitor alarmed "ST elevation" which prompted the EKG to be obtained.   Patient does not appear to be in any distress, denies any chest pain, SOB, or nausea now, only complains that his buttock aches that he attributes to the hospital bed.   He has hx of coronary artery calcification, PAF not anticoagulated, HFpEF, severe AS s/p TAVR, and PUD, was admitted yesterday with melena and lightheadedness with fall, has completed 1 unit RBC transfusion with improvement in Hgb from 6.5 to 7.5.   Appreciate cardiology fellow reviewing EKG and case. Patient is asymptomatic, EKG findings not definitive, and pt has contraindication to cath. Plan to transfuse another unit RBC and repeat EKG.

## 2021-12-14 ENCOUNTER — Inpatient Hospital Stay (HOSPITAL_COMMUNITY): Payer: Medicare Other | Admitting: Certified Registered Nurse Anesthetist

## 2021-12-14 ENCOUNTER — Encounter (HOSPITAL_COMMUNITY)
Admission: EM | Disposition: A | Discharge status: Skilled Nursing Facility | Payer: Self-pay | Source: Home / Self Care | Attending: Internal Medicine

## 2021-12-14 ENCOUNTER — Encounter (HOSPITAL_COMMUNITY): Payer: Self-pay | Admitting: Internal Medicine

## 2021-12-14 DIAGNOSIS — K254 Chronic or unspecified gastric ulcer with hemorrhage: Secondary | ICD-10-CM

## 2021-12-14 DIAGNOSIS — K2901 Acute gastritis with bleeding: Secondary | ICD-10-CM

## 2021-12-14 DIAGNOSIS — E119 Type 2 diabetes mellitus without complications: Secondary | ICD-10-CM

## 2021-12-14 DIAGNOSIS — I10 Essential (primary) hypertension: Secondary | ICD-10-CM

## 2021-12-14 HISTORY — PX: BIOPSY: SHX5522

## 2021-12-14 HISTORY — PX: ESOPHAGOGASTRODUODENOSCOPY: SHX5428

## 2021-12-14 LAB — TYPE AND SCREEN
ABO/RH(D): O NEG
Antibody Screen: NEGATIVE
Unit division: 0
Unit division: 0

## 2021-12-14 LAB — BPAM RBC
Blood Product Expiration Date: 202302112359
Blood Product Expiration Date: 202302132359
ISSUE DATE / TIME: 202302091153
ISSUE DATE / TIME: 202302092247
Unit Type and Rh: 9500
Unit Type and Rh: 9500

## 2021-12-14 LAB — CBC
HCT: 26.6 % — ABNORMAL LOW (ref 39.0–52.0)
Hemoglobin: 8.7 g/dL — ABNORMAL LOW (ref 13.0–17.0)
MCH: 31.3 pg (ref 26.0–34.0)
MCHC: 32.7 g/dL (ref 30.0–36.0)
MCV: 95.7 fL (ref 80.0–100.0)
Platelets: 204 10*3/uL (ref 150–400)
RBC: 2.78 MIL/uL — ABNORMAL LOW (ref 4.22–5.81)
RDW: 16.8 % — ABNORMAL HIGH (ref 11.5–15.5)
WBC: 5.5 10*3/uL (ref 4.0–10.5)
nRBC: 0.5 % — ABNORMAL HIGH (ref 0.0–0.2)

## 2021-12-14 LAB — COMPREHENSIVE METABOLIC PANEL
ALT: 17 U/L (ref 0–44)
AST: 29 U/L (ref 15–41)
Albumin: 2.8 g/dL — ABNORMAL LOW (ref 3.5–5.0)
Alkaline Phosphatase: 58 U/L (ref 38–126)
Anion gap: 10 (ref 5–15)
BUN: 65 mg/dL — ABNORMAL HIGH (ref 8–23)
CO2: 25 mmol/L (ref 22–32)
Calcium: 8.8 mg/dL — ABNORMAL LOW (ref 8.9–10.3)
Chloride: 106 mmol/L (ref 98–111)
Creatinine, Ser: 1.56 mg/dL — ABNORMAL HIGH (ref 0.61–1.24)
GFR, Estimated: 43 mL/min — ABNORMAL LOW (ref >60)
Glucose, Bld: 117 mg/dL — ABNORMAL HIGH (ref 70–99)
Potassium: 4.2 mmol/L (ref 3.5–5.1)
Sodium: 141 mmol/L (ref 135–145)
Total Bilirubin: 1.4 mg/dL — ABNORMAL HIGH (ref 0.3–1.2)
Total Protein: 5.3 g/dL — ABNORMAL LOW (ref 6.5–8.1)

## 2021-12-14 LAB — MRSA NEXT GEN BY PCR, NASAL: MRSA by PCR Next Gen: NOT DETECTED

## 2021-12-14 LAB — PHOSPHORUS: Phosphorus: 5 mg/dL — ABNORMAL HIGH (ref 2.5–4.6)

## 2021-12-14 LAB — GLUCOSE, CAPILLARY
Glucose-Capillary: 110 mg/dL — ABNORMAL HIGH (ref 70–99)
Glucose-Capillary: 95 mg/dL (ref 70–99)
Glucose-Capillary: 91 mg/dL (ref 70–99)

## 2021-12-14 LAB — MAGNESIUM: Magnesium: 2.1 mg/dL (ref 1.7–2.4)

## 2021-12-14 SURGERY — EGD (ESOPHAGOGASTRODUODENOSCOPY)
Anesthesia: Monitor Anesthesia Care

## 2021-12-14 MED ORDER — LIDOCAINE 2% (20 MG/ML) 5 ML SYRINGE
INTRAMUSCULAR | Status: DC | PRN
  Administered 2021-12-14: 40 mg via INTRAVENOUS

## 2021-12-14 MED ORDER — PROPOFOL 10 MG/ML IV BOLUS
INTRAVENOUS | Status: DC | PRN
  Administered 2021-12-14: 10 mg via INTRAVENOUS

## 2021-12-14 MED ORDER — PHENYLEPHRINE 40 MCG/ML (10ML) SYRINGE FOR IV PUSH (FOR BLOOD PRESSURE SUPPORT)
PREFILLED_SYRINGE | INTRAVENOUS | Status: DC | PRN
  Administered 2021-12-14: 80 ug via INTRAVENOUS
  Administered 2021-12-14 (×2): 40 ug via INTRAVENOUS
  Administered 2021-12-14: 80 ug via INTRAVENOUS
  Administered 2021-12-14: 120 ug via INTRAVENOUS
  Administered 2021-12-14 (×2): 80 ug via INTRAVENOUS

## 2021-12-14 MED ORDER — SODIUM CHLORIDE 0.9 % IV SOLN: INTRAVENOUS | Status: DC

## 2021-12-14 MED ORDER — PROPOFOL 500 MG/50ML IV EMUL
INTRAVENOUS | Status: DC | PRN
  Administered 2021-12-14: 100 ug/kg/min via INTRAVENOUS

## 2021-12-14 MED ORDER — SODIUM CHLORIDE 0.9 % IV SOLN: INTRAVENOUS | Status: DC | PRN

## 2021-12-14 MED ORDER — INSULIN ASPART 100 UNIT/ML IJ SOLN
0.0000 [IU] | Freq: Three times a day (TID) | INTRAMUSCULAR | Status: DC
  Administered 2021-12-15: 2 [IU] via SUBCUTANEOUS
  Administered 2021-12-15 – 2021-12-16 (×2): 3 [IU] via SUBCUTANEOUS
  Administered 2021-12-16 – 2021-12-18 (×6): 2 [IU] via SUBCUTANEOUS
  Administered 2021-12-18: 5 [IU] via SUBCUTANEOUS

## 2021-12-14 MED ORDER — INSULIN ASPART 100 UNIT/ML IJ SOLN: 0.0000 [IU] | Freq: Every day | INTRAMUSCULAR | Status: DC

## 2021-12-14 NOTE — Interval H&P Note (Signed)
History and Physical Interval Note:  12/14/2021 9:54 AM  [name]  has presented today for surgery, with the diagnosis of Symptomatic anemia, melena.  The various methods of treatment have been discussed with the patient and family. After consideration of risks, benefits and other options for treatment, the patient has consented to  Procedure(s): ESOPHAGOGASTRODUODENOSCOPY (EGD) (N/A) as a surgical intervention.  The patient's history has been reviewed, patient examined, no change in status, stable for surgery.  I have reviewed the patient's chart and labs.  Questions were answered to the patient's satisfaction.     Lear Ng

## 2021-12-14 NOTE — Anesthesia Procedure Notes (Signed)
Procedure Name: MAC Date/Time: 12/14/2021 10:05 AM Performed by: Janene Harvey, CRNA Pre-anesthesia Checklist: Patient identified, Emergency Drugs available, Suction available and Patient being monitored Patient Re-evaluated:Patient Re-evaluated prior to induction Oxygen Delivery Method: Nasal cannula Induction Type: IV induction Placement Confirmation: positive ETCO2 Dental Injury: Teeth and Oropharynx as per pre-operative assessment

## 2021-12-14 NOTE — Transfer of Care (Signed)
Immediate Anesthesia Transfer of Care Note  Patient: [name]  Procedure(s) Performed: ESOPHAGOGASTRODUODENOSCOPY (EGD) BIOPSY  Patient Location: Endoscopy Unit  Anesthesia Type:MAC  Level of Consciousness: drowsy and patient cooperative  Airway & Oxygen Therapy: Patient Spontanous Breathing and Patient connected to nasal cannula oxygen  Post-op Assessment: Report given to RN and Post -op Vital signs reviewed and stable  Post vital signs: Reviewed and stable  Last Vitals:  Vitals Value Taken Time  BP 99/50 12/14/21 1034  Temp    Pulse 102 12/14/21 1035  Resp 24 12/14/21 1035  SpO2 98 % 12/14/21 1035  Vitals shown include unvalidated device data.  Last Pain:  Vitals:   12/14/21 0922  TempSrc: Temporal  PainSc: 0-No pain         Complications: No notable events documented.

## 2021-12-14 NOTE — Progress Notes (Signed)
History and Physical  [name] OHY:[id number] DOB: Mar 14, 1933 DOA: 12/13/2021  Referring physician: Dr. Lavone [name], [place]  PCP: Aaron Pepper, MD  Outpatient Specialists: Cardiology Patient coming from: Home. Chief Complaint: Generalized weakness, fall last night.  HPI: [name] is a 88 y.o. male with medical history significant for coronary artery calcification, severe aortic stenosis status post TAVR, HFpEF, moderate mitral regurgitation, moderate tricuspid regurgitation, type 2 diabetes, essential hypertension, persistent atrial fibrillation not on oral anticoagulation, peripheral artery disease, CKD 3B, prior GI bleed with gastric ulcers who presented to [facility] ED with complaints of generalized weakness and a fall last night.  Patient denies any chest pain, dyspnea, or palpitations prior to his fall.  Denies tripping on any objects.  There has been no changes in his appetite.  He denies use of NSAIDs.  Endorses associated dark stools for the past 1 week.  He recalls hitting his head during the fall.  He was brought into the ED today from home for further evaluation and management of his symptomatology.  Work-up in the ED revealed symptomatic anemia with hemoglobin of 6.5K and positive FOBT.  1 unit of PRBCs was ordered to be transfused by EDP.  GI was also consulted by EDP.  TRH, hospitalist service, was asked to admit.  At the time of this evaluation, patient denies any abdominal pain except when palpated, tenderness is localized in the right upper quadrant.  Last bowel movement was on 12/12/2021, semiformed and dark.  Patient was started on IV Protonix 40 mg twice daily in the ED.  UA and COVID-19 screening tests all negative.  12/14/21:  Seen and evaluated at bedside.  Complains of pain in his buttocks from laying on the ED stretcher.  Post EGD by Aaron. Michail Sermon, it revealed oozing gastric ulcers with pigmented material. Biopsied. Acute gastritis. Biopsied.  GI recommended to continue IV Protonix  40 mg a day.   Past Medical History:  Diagnosis Date   Anemia    LOW PLATELETS OTHER DAY  PER PT   Anticoagulated on Coumadin 12/21/2012   Aortic stenosis    Arthritis    "left wrist; back sometimes" (01/21/2013)   Atrial fibrillation ([place])    [name], [name]   Bradycardia    Exertional shortness of breath    Heart murmur    "I've had it for years; runs in the family on daddy's side" (01/21/2013)   HTN (hypertension)    Hyperlipemia 12/21/2012   Prostate cancer ([place])    "had 40 tx of radiation in 2009" (01/21/2013)   S/P aortic valve replacement with bioprosthetic valve 01/19/2013   Transcatheter Aortic Valve Replacement using 67mm Sapien bioprosthetic tissue valve via transapical approach   Type II diabetes mellitus ([place])    Past Surgical History:  Procedure Laterality Date   BALLOON DILATION N/A 08/02/2020   Procedure: BALLOON DILATION;  Surgeon: [name], MD;  Location: [facility] ENDOSCOPY;  Service: Gastroenterology;  Laterality: N/A;   BIOPSY  10/03/2021   Procedure: BIOPSY;  Surgeon: [name], MD;  Location: [facility]OSCOPY;  Service: Endoscopy;;   CARDIAC CATHETERIZATION  12/16/2012   CARDIAC VALVE REPLACEMENT  01/19/2013   AVR   CATARACT EXTRACTION W/ INTRAOCULAR LENS  IMPLANT, BILATERAL     ESOPHAGOGASTRODUODENOSCOPY N/A 10/03/2021   Procedure: ESOPHAGOGASTRODUODENOSCOPY (EGD);  Surgeon: [name], MD;  Location: [facility];  Service: Endoscopy;  Laterality: N/A;   ESOPHAGOGASTRODUODENOSCOPY (EGD) WITH PROPOFOL N/A 08/02/2020   Procedure: ESOPHAGOGASTRODUODENOSCOPY (EGD) WITH PROPOFOL;  Surgeon: [name], MD;  Location: [facility];  Service: Gastroenterology;  Laterality: N/A;   EYE SURGERY     INTRAOPERATIVE TRANSESOPHAGEAL ECHOCARDIOGRAM N/A 01/19/2013   Procedure: INTRAOPERATIVE TRANSESOPHAGEAL ECHOCARDIOGRAM;  Surgeon: [name], MD;  Location: [facility];  Service: Open Heart Surgery;  Laterality: N/A;   ORIF FOREARM FRACTURE Left 1954   "compound fx"  (01/21/2013)   TRANSTHORACIC ECHOCARDIOGRAM  09/04/10, 09/07/08    Social History:  reports that he quit smoking about 45 years ago. His smoking use included cigarettes. He has a 44.00 pack-year smoking history. He has never used smokeless tobacco. He reports that he does not drink alcohol and does not use drugs.   Allergies  Allergen Reactions   Amoxicillin Other (See Comments)    Burning of mouth, tongue and lips.  itchy, burning throat    Family History  Problem Relation Age of Onset   Diabetes Mother    Melanoma Mother 72   Bone cancer Mother 53   Pancreatic cancer Father 69   Heart disease Father    Stroke Sister    Cancer Brother 71       ? brain cancer      Prior to Admission medications   Medication Sig Start Date End Date Taking? Authorizing Provider  ACCU-CHEK AVIVA PLUS test strip  05/23/18   [provider]  ACCU-CHEK SOFTCLIX LANCETS lancets daily. use as directed 04/07/18   [provider]  Acetaminophen (TYLENOL ARTHRITIS PAIN PO) Take 650 mg by mouth daily with breakfast.    [provider]  Amino Acids-Protein Hydrolys (FEEDING SUPPLEMENT, PRO-STAT SUGAR FREE 64,) LIQD Take 30 mLs by mouth 2 (two) times daily. Patient not taking: Reported on 11/08/2021    [provider]  aspirin EC 81 MG tablet Take 1 tablet (81 mg total) by mouth daily. Swallow whole. 11/19/21   [name], MD  B Complex Vitamins (B COMPLEX 50) TABS Take 1 tablet by mouth daily with breakfast.    [provider]  bisacodyl (DULCOLAX) 10 MG suppository Place rectally as needed for moderate constipation. If not relieved by MOM, give 10 mg Bisacodyl suppositiory in 24 hours as needed Patient not taking: Reported on 11/08/2021    [provider]  carvedilol (COREG) 12.5 MG tablet Take 12.5 mg by mouth 2 (two) times daily. 11/08/21   [provider]  cholecalciferol (VITAMIN D3) 25 MCG (1000 UNIT) tablet Take 1,000 Units by mouth 2  (two) times daily.    [provider]  dapagliflozin propanediol (FARXIGA) 10 MG TABS tablet Take 1 tablet (10 mg total) by mouth daily before breakfast. 10/23/21   Medina-Vargas, Monina C, NP  doxazosin (CARDURA) 8 MG tablet Take 1 tablet (8 mg total) by mouth daily. 10/23/21   Medina-Vargas, Monina C, NP  enzalutamide (XTANDI) 40 MG capsule Take 4 capsules (160 mg total) by mouth daily at 2 PM. 10/23/21   Medina-Vargas, Monina C, NP  ferrous sulfate 325 (65 FE) MG tablet Take 325 mg by mouth See admin instructions. Take one tablet (325 mg) by mouth twice daily - with lunch and supper    [provider]  gabapentin (NEURONTIN) 100 MG capsule Take 1 capsule (100 mg total) by mouth at bedtime. 10/23/21   Medina-Vargas, Monina C, NP  lisinopril (ZESTRIL) 20 MG tablet Take 1 tablet (20 mg total) by mouth daily. 10/23/21   Medina-Vargas, Monina C, NP  magnesium hydroxide (MILK OF MAGNESIA) 400 MG/5ML suspension Take by mouth daily as needed for mild constipation. If  no BM in 3 days, give 30 cc Milk of Magnesium in 24 hours as needed Patient not taking: Reported on 11/08/2021    [provider]  metFORMIN (GLUCOPHAGE) 1000 MG tablet Take 1 tablet (1,000 mg total) by mouth 2 (two) times daily with breakfast and lunch. 10/23/21   Medina-Vargas, Monina C, NP  metoprolol succinate (TOPROL-XL) 50 MG 24 hr tablet Take 1 tablet (50 mg total) by mouth daily. Take with or immediately following a meal. 10/23/21   Medina-Vargas, Monina C, NP  pantoprazole (PROTONIX) 40 MG tablet Take 1 tablet (40 mg total) by mouth daily. 10/23/21   Medina-Vargas, Monina C, NP  rosuvastatin (CRESTOR) 5 MG tablet TAKE 1 TABLET BY MOUTH ONCE DAILY AT  6PM 10/23/21   Medina-Vargas, Monina C, NP  Selenium 200 MCG CAPS Take 200 mcg by mouth daily with breakfast.    [provider]  simethicone (MYLICON) 80 MG chewable tablet Chew 1 tablet (80 mg total) by mouth 4 (four) times daily as needed for  flatulence. Patient not taking: Reported on 11/08/2021 10/08/21   Lavina Hamman, MD  Sodium Phosphates (RA SALINE ENEMA RE) If not relieved by Biscodyl suppository, give disposable Saline Enema rectally x1 dose 24 hrs as needed Patient not taking: Reported on 11/08/2021    [provider]  torsemide (DEMADEX) 20 MG tablet Take 2 tablets (40 mg total) by mouth 2 (two) times daily. 11/23/21 02/21/22  [name], MD  verapamil (CALAN-SR) 180 MG CR tablet Take 180 mg by mouth daily. 11/08/21   [provider]  Vitamin A 2400 MCG (8000 UT) TABS Take 2,400 mcg by mouth daily with breakfast.    [provider]  vitamin B-12 (CYANOCOBALAMIN) 1000 MCG tablet Take 1,000 mcg by mouth daily.    [provider]  vitamin C (ASCORBIC ACID) 500 MG tablet Take 500 mg by mouth 2 (two) times daily.    [provider]  zinc gluconate 50 MG tablet Take 50 mg by mouth daily with breakfast.    [provider]    Physical Exam: BP 119/66    Pulse (!) 108    Temp (!) 97.2 F (36.2 C) (Temporal)    Resp 19    SpO2 97%   General: 88 y.o. year-old male frail-appearing no acute distress.  He is alert and oriented x3.   Cardiovascular: Regular rate and rhythm no rubs or gallops.   Respiratory: Clear to auscultation no wheezes or rales. Abdomen: Soft nontender normal bowel sounds present.   Muskuloskeletal: Trace lower extremity edema bilaterally. Neuro: CN II-XII intact, strength, sensation, reflexes Skin: No rashes or ulcerative lesions noted. Psychiatry: Mood is appropriate for condition and setting.         Labs on Admission:  Basic Metabolic Panel: Recent Labs  Lab 12/13/21 0726 12/14/21 0545  NA 139 141  K 4.8 4.2  CL 101 106  CO2 24 25  GLUCOSE 129* 117*  BUN 82* 65*  CREATININE 1.98* 1.56*  CALCIUM 9.0 8.8*  MG  --  2.1  PHOS  --  5.0*   Liver Function Tests: Recent Labs  Lab 12/13/21 0726 12/14/21 0545  AST 30 29  ALT 17 17   ALKPHOS 58 58  BILITOT 0.7 1.4*  PROT 5.7* 5.3*  ALBUMIN 3.1* 2.8*   No results for input(s): LIPASE, AMYLASE in the last 168 hours. No results for input(s): AMMONIA in the last 168 hours. CBC: Recent Labs  Lab 12/13/21 0726 12/13/21 2029  12/14/21 0545  WBC 4.8 4.6 5.5  NEUTROABS 3.2  --   --   HGB 6.5* 7.5* 8.7*  HCT 20.9* 22.7* 26.6*  MCV 99.1 97.4 95.7  PLT 208 197 204   Cardiac Enzymes: No results for input(s): CKTOTAL, CKMB, CKMBINDEX, TROPONINI in the last 168 hours.  BNP (last 3 results) Recent Labs    12/13/21 1455  BNP 768.6*    ProBNP (last 3 results) Recent Labs    12/05/21 0915  PROBNP 6,343*    CBG: Recent Labs  Lab 12/13/21 0705  GLUCAP 112*    Radiological Exams on Admission: CT HEAD WO CONTRAST (5MM)  Result Date: 12/13/2021 CLINICAL DATA:  Head trauma, moderate-severe EXAM: CT HEAD WITHOUT CONTRAST TECHNIQUE: Contiguous axial images were obtained from the base of the skull through the vertex without intravenous contrast. RADIATION DOSE REDUCTION: This exam was performed according to the departmental dose-optimization program which includes automated exposure control, adjustment of the mA and/or kV according to patient size and/or use of iterative reconstruction technique. COMPARISON:  10/01/2021 FINDINGS: Brain: There is no acute intracranial hemorrhage, significant mass effect, or edema. Gray-white differentiation is preserved. There is no extra-axial fluid collection. Prominence of the ventricles and sulci reflects similar parenchymal volume loss. Patchy low-density in the supratentorial white matter is nonspecific but may reflect stable chronic microvascular ischemic changes. Similar prominence of the extra-axial space along the right cerebral convexity that could reflect a thin subdural hygroma. Vascular: There is atherosclerotic calcification at the skull base. Skull: Calvarium is unremarkable. Sinuses/Orbits: No acute finding. Other: None.  IMPRESSION: No evidence of acute intracranial injury. 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 Electronically Signed   By: Macy Mis M.D.   On: 12/13/2021 08:26   CT Cervical Spine Wo Contrast  Result Date: 12/13/2021 CLINICAL DATA:  Neck trauma (Age >= 65y) EXAM: CT CERVICAL SPINE WITHOUT CONTRAST TECHNIQUE: Multidetector CT imaging of the cervical spine was performed without intravenous contrast. Multiplanar CT image reconstructions were also generated. RADIATION DOSE REDUCTION: This exam was performed according to the departmental dose-optimization program which includes automated exposure control, adjustment of the mA and/or kV according to patient size and/or use of iterative reconstruction technique. COMPARISON:  None. FINDINGS: Alignment: Mild degenerative listhesis. Skull base and vertebrae: Degenerative endplate irregularity. No acute fracture. Soft tissues and spinal canal: No prevertebral fluid or swelling. No visible canal hematoma. Disc levels: Multilevel degenerative changes are present including disc space narrowing, endplate osteophytes, and facet and uncovertebral hypertrophy. Upper chest: No apical lung mass. Other: Atherosclerosis including significant calcification at the common carotid bifurcations. IMPRESSION: No acute cervical spine fracture. Electronically Signed   By: Macy Mis M.D.   On: 12/13/2021 08:33   DG Chest Portable 1 View  Result Date: 12/13/2021 CLINICAL DATA:  Shortness of breath in an 88 year old male. EXAM: PORTABLE CHEST 1 VIEW COMPARISON:  October 01, 2021. FINDINGS: EKG leads project over the chest. Trachea is midline. Cardiomediastinal contours mild cardiac enlargement unchanged compared to recent imaging. Mild interstitial prominence is noted without frank signs of pulmonary edema. No lobar consolidation or sign of pleural fluid. No pneumothorax. On limited assessment there is no acute skeletal process. Marked LEFT glenohumeral  degenerative changes are noted. IMPRESSION: Stable mild cardiac enlargement. Mild interstitial prominence slightly asymmetric could reflect changes of early CHF or atypical infection. Electronically Signed   By: Zetta Bills M.D.   On: 12/13/2021 11:50    EKG: I independently viewed the EKG done and my findings are as followed:  Atrial fibrillation rate of 87 with PVCs.  Assessment/Plan Present on Admission: **None**  Principal Problem:   Weakness  Generalized weakness, multifactorial secondary to symptomatic anemia, AKI, acute illness. Treat underlying conditions. Presented with hemoglobin of 6.5 and positive FOBT, 1 unit PRBC ordered to be transfused in the ED. Repeat CBC posttransfusion Continue IV Protonix 40 mg twice daily started in the ED. Hold off any anticoagulation or antiplatelets for now. GI following PT OT to assess, continue fall precautions.  Acute blood loss anemia with suspected upper GI bleed with history of gastric ulcers seen on EGD done on 10/03/2021 by Aaron. Watt Climes. Maintain hemoglobin above 8 due to underlying cardiac history. Hemoglobin stable and uptrending 8.7 from 7.5, post 1 unit PRBC transfusion. Post EGD by Aaron. Michail Sermon, it revealed oozing gastric ulcers with pigmented material. Biopsied. Acute gastritis. Biopsied.  GI recommended to continue IV Protonix 40 mg a day.  Bleeding gastric ulcers/acute gastritis seen on EGD done on 12/14/2018 by Aaron. Michail Sermon, GI. Continue IV Protonix 40 mg twice daily as recommended by GI Closely monitor H&H. Continue to maintain MAP greater than 65.  Resolving AKI on CKD 3B Baseline creatinine appears to be 1.5 with GFR of 44. Presented with creatinine of 1.98 with GFR of 32 Renal function is back to baseline 1.5 with GFR 43 Continue to avoid nephrotoxic agents, and hypotension. Continue to monitor urine output with strict I's and O's Repeat renal panel in the morning.  Iron deficiency anemia Last iron studies done on  10/04/2021 showed severe iron deficiency Hold off oral iron replacement for now due to possible EGD. Resume oral iron supplement when okay with GI. May consider IV iron supplement.  Permanent atrial fibrillation, not on oral anticoagulation, currently rate controlled. Hold off oral antihypertensives to avoid hypotension in the setting of suspected GI bleed. Continue to closely monitor on telemetry.  Chronic diastolic CHF Last 2D echo done on 11/11/2021 revealed LVEF 60 to 65% with no regional wall motion abnormalities.  Left atrial size and right atrial size was severely dilated.  There was severely elevated pulmonary artery systolic pressure. Continue strict I's and O's and daily weight. Hold off home beta-blocker and ACE inhibitor due to soft BPs to avoid hypotension in the setting of GI bleed.  BPH Continue to hold off home Cardura for now to avoid hypotension.  Hyperlipidemia Continue to hold off Crestor for now.  Type 2 diabetes with hyperglycemia Obtain hemoglobin A1c N.p.o. until seen by GI. Start insulin sliding scale, avoid hypoglycemia.  Recurrent fall Per ED staff, family reports frequent falls at home PT OT assessment on 12/14/2021. Orthostatic vital signs on 12/14/2021. Fall precautions.     DVT prophylaxis: SCDs  Code Status: Full code  Family Communication: None at bedside  Disposition Plan: Likely will discharge to home with home health services once GI signs off.  Currently being monitored in the setting of recent GI bleed with oozing gastric ulcer seen on EGD.  Consults called: GI consulted by EDP.  Admission status: Inpatient status.   Status is: Inpatient  Patient requires at least 2 midnights for further evaluation and treatment of present condition.     Kayleen Memos MD [facility] Pager [phone]  If 7PM-7AM, please contact night-coverage [email or web address] Password [facility]  12/14/2021, 3:54 PM

## 2021-12-14 NOTE — Plan of Care (Signed)
  Problem: Education: Goal: Knowledge of General Education information will improve Description Including pain rating scale, medication(s)/side effects and non-pharmacologic comfort measures Outcome: Progressing   Problem: Activity: Goal: Risk for activity intolerance will decrease Outcome: Progressing   Problem: Safety: Goal: Ability to remain free from injury will improve Outcome: Progressing   

## 2021-12-14 NOTE — ED Notes (Addendum)
Patient receiving blood products at this time.

## 2021-12-14 NOTE — Progress Notes (Signed)
Pt arrived on unit and has multiple skin tears, abrasions, and ecchymoses all over. Abrasions on forehead, necks, arms bilaterally; skin tears L elbow, mid back, and R shoulder - foams placed with petroleum gauze; ecchymoses on arms bilaterally, L hip, L thigh. Scratch on posterior neck was bleeding. Bottom was pink but blanchable. Pt alert and oriented X4 and not in any pain. RN continuing to monitor.

## 2021-12-14 NOTE — Op Note (Signed)
[facility] Patient Name: [name] Procedure Date : 12/14/2021 MRN: [id number] Attending MD: Lear Ng , MD Date of Birth: October 22, 1933 CSN: [id number] Age: 88 Admit Type: Inpatient Procedure:                Upper GI endoscopy Indications:              Melena, Personal history of peptic ulcer disease Providers:                Lear Ng, MD, Dulcy Fanny, [facility] Technician, Technician Referring MD:             hospital team Medicines:                Propofol per Anesthesia, Monitored Anesthesia Care Complications:            No immediate complications. Estimated Blood Loss:     Estimated blood loss was minimal. Procedure:                Pre-Anesthesia Assessment:                           - Prior to the procedure, a History and Physical                            was performed, and patient medications and                            allergies were reviewed. The patient's tolerance of                            previous anesthesia was also reviewed. The risks                            and benefits of the procedure and the sedation                            options and risks were discussed with the patient.                            All questions were answered, and informed consent                            was obtained. Prior Anticoagulants: The patient has                            taken no previous anticoagulant or antiplatelet                            agents. ASA Grade Assessment: III - A patient with                            severe systemic disease. After reviewing the risks  and benefits, the patient was deemed in                            satisfactory condition to undergo the procedure.                           After obtaining informed consent, the endoscope was                            passed under direct vision. Throughout the                            procedure, the  patient's blood pressure, pulse, and                            oxygen saturations were monitored continuously. The                            [id number]-[id number] ([id number] Olympus endoscope was introduced                            through the mouth, and advanced to the second part                            of duodenum. The upper GI endoscopy was                            accomplished without difficulty. The patient                            tolerated the procedure well. Scope In: Scope Out: Findings:      The examined esophagus was normal.      The Z-line was regular and was found 38 cm from the incisors.      Few oozing cratered gastric ulcers with pigmented material were found in       the gastric body. The largest lesion was 12 mm in largest dimension.       Biopsies were taken with a cold forceps for histology. Estimated blood       loss was minimal.      Segmental moderate inflammation characterized by congestion (edema),       erosions and erythema was found in the gastric body. Biopsies were taken       with a cold forceps for histology. Estimated blood loss was minimal.      The examined duodenum was normal.      The cardia and gastric fundus were normal on retroflexion. Impression:               - Normal esophagus.                           - Z-line regular, 38 cm from the incisors.                           - Oozing gastric ulcers with pigmented material.  Biopsied.                           - Acute gastritis. Biopsied.                           - Normal examined duodenum. Recommendation:           - Clear liquid diet.                           - Observe patient's clinical course.                           - Use Protonix (pantoprazole) 40 mg IV BID.                           - Await pathology results. Procedure Code(s):        --- Professional ---                           [phone], Esophagogastroduodenoscopy, flexible,                            transoral;  with biopsy, single or multiple Diagnosis Code(s):        --- Professional ---                           K92.1, Melena (includes Hematochezia)                           K25.4, Chronic or unspecified gastric ulcer with                            hemorrhage                           K29.00, Acute gastritis without bleeding                           Z87.11, Personal history of peptic ulcer disease CPT copyright 2019 [employer]. All rights reserved. The codes documented in this report are preliminary and upon coder review may  be revised to meet current compliance requirements. Lear Ng, MD 12/14/2021 10:34:00 AM This report has been signed electronically. Number of Addenda: 0

## 2021-12-14 NOTE — Progress Notes (Signed)
OT Cancellation Note  Patient Details Name: MIKLO AKEN MRN: [id number] DOB: August 25, 1933   Cancelled Treatment:    Reason Eval/Treat Not Completed: Patient at procedure or test/ unavailable  Jolaine Artist, [place] Pager [phone] Office [phone]   Delight Stare 12/14/2021, 11:04 AM

## 2021-12-14 NOTE — Anesthesia Preprocedure Evaluation (Signed)
Anesthesia Evaluation  Patient identified by MRN, date of birth, ID band Patient awake    Reviewed: Allergy & Precautions, NPO status , Patient's Chart, lab work & pertinent test results  History of Anesthesia Complications Negative for: history of anesthetic complications  Airway Mallampati: III  TM Distance: >3 FB Neck ROM: Full    Dental  (+) Upper Dentures, Lower Dentures, Edentulous Upper, Edentulous Lower   Pulmonary neg shortness of breath, neg sleep apnea, neg COPD, neg recent URI, former smoker,    breath sounds clear to auscultation       Cardiovascular hypertension, + dysrhythmias  Rhythm:Irregular  1. Left ventricular ejection fraction, by estimation, is 60 to 65%. The  left ventricle has normal function. The left ventricle has no regional  wall motion abnormalities. Left ventricular diastolic parameters are  indeterminate.  2. Mildly D-shaped septum suggestive of RV pressure/volume overload.  Right ventricular systolic function is mildly reduced. The right  ventricular size is mildly enlarged. There is severely elevated pulmonary  artery systolic pressure. The estimated right  ventricular systolic pressure is 29.5 mmHg.  3. Left atrial size was severely dilated.  4. Right atrial size was severely dilated.  5. The mitral valve is degenerative. Probably severe mitral valve  regurgitation, PISA ERO 0.41 cm^2. No evidence of mitral stenosis.  6. The tricuspid valve is abnormal. Tricuspid valve regurgitation is  severe.  7. Bioprosthetic aortic valve s/p TAVR with 26 mm Edwards Sapien THV.  Trivial perivalvular leakage. Mean gradient 9 mmHg, DI 0.38.  8. Aortic dilatation noted. There is mild dilatation of the ascending  aorta, measuring 36 mm.  9. The inferior vena cava is dilated in size with <50% respiratory  variability, suggesting right atrial pressure of 15 mmHg.    Neuro/Psych negative neurological  ROS  negative psych ROS   GI/Hepatic Neg liver ROS, ? GI bleed   Endo/Other  diabetes  Renal/GU negative Renal ROSLab Results      Component                Value               Date                      CREATININE               1.56 (H)            12/14/2021           Lab Results      Component                Value               Date                      K                        4.2                 12/14/2021                Musculoskeletal  (+) Arthritis ,   Abdominal   Peds  Hematology  (+) Blood dyscrasia, anemia , Lab Results      Component                Value  Date                      WBC                      5.5                 12/14/2021                HGB                      8.7 (L)             12/14/2021                HCT                      26.6 (L)            12/14/2021                MCV                      95.7                12/14/2021                PLT                      204                 12/14/2021              Anesthesia Other Findings   Reproductive/Obstetrics                             Anesthesia Physical Anesthesia Plan  ASA: 3  Anesthesia Plan: MAC   Post-op Pain Management: Minimal or no pain anticipated   Induction:   PONV Risk Score and Plan: 1 and Propofol infusion and Treatment may vary due to age or medical condition  Airway Management Planned: Nasal Cannula  Additional Equipment: None  Intra-op Plan:   Post-operative Plan:   Informed Consent: I have reviewed the patients History and Physical, chart, labs and discussed the procedure including the risks, benefits and alternatives for the proposed anesthesia with the patient or authorized representative who has indicated his/her understanding and acceptance.     Dental advisory given  Plan Discussed with: Anesthesiologist and CRNA  Anesthesia Plan Comments:         Anesthesia Quick Evaluation

## 2021-12-14 NOTE — Progress Notes (Signed)
PT Cancellation Note  Patient Details Name: [name] MRN: [id number] DOB: 07/29/1933   Cancelled Treatment:    Reason Eval/Treat Not Completed: Patient at procedure or test/unavailable   Shareese Macha B Jaymond Waage 12/14/2021, 10:49 AM Bayard Males, PT Acute Rehabilitation Services Pager: [phone] Office: [phone]

## 2021-12-14 NOTE — ED Notes (Signed)
Patient transported to endo.  

## 2021-12-15 LAB — BASIC METABOLIC PANEL
Anion gap: 16 — ABNORMAL HIGH (ref 5–15)
BUN: 50 mg/dL — ABNORMAL HIGH (ref 8–23)
CO2: 20 mmol/L — ABNORMAL LOW (ref 22–32)
Calcium: 8.7 mg/dL — ABNORMAL LOW (ref 8.9–10.3)
Chloride: 105 mmol/L (ref 98–111)
Creatinine, Ser: 1.44 mg/dL — ABNORMAL HIGH (ref 0.61–1.24)
GFR, Estimated: 47 mL/min — ABNORMAL LOW (ref >60)
Glucose, Bld: 88 mg/dL (ref 70–99)
Potassium: 4.2 mmol/L (ref 3.5–5.1)
Sodium: 141 mmol/L (ref 135–145)

## 2021-12-15 LAB — HEPATIC FUNCTION PANEL
ALT: 20 U/L (ref 0–44)
AST: 29 U/L (ref 15–41)
Albumin: 2.8 g/dL — ABNORMAL LOW (ref 3.5–5.0)
Alkaline Phosphatase: 67 U/L (ref 38–126)
Bilirubin, Direct: 0.5 mg/dL — ABNORMAL HIGH (ref 0.0–0.2)
Indirect Bilirubin: 1.2 mg/dL — ABNORMAL HIGH (ref 0.3–0.9)
Total Bilirubin: 1.7 mg/dL — ABNORMAL HIGH (ref 0.3–1.2)
Total Protein: 5.5 g/dL — ABNORMAL LOW (ref 6.5–8.1)

## 2021-12-15 LAB — CBC
HCT: 29.5 % — ABNORMAL LOW (ref 39.0–52.0)
Hemoglobin: 9.2 g/dL — ABNORMAL LOW (ref 13.0–17.0)
MCH: 31.2 pg (ref 26.0–34.0)
MCHC: 31.2 g/dL (ref 30.0–36.0)
MCV: 100 fL (ref 80.0–100.0)
Platelets: 215 10*3/uL (ref 150–400)
RBC: 2.95 MIL/uL — ABNORMAL LOW (ref 4.22–5.81)
RDW: 17 % — ABNORMAL HIGH (ref 11.5–15.5)
WBC: 7.9 10*3/uL (ref 4.0–10.5)
nRBC: 0.4 % — ABNORMAL HIGH (ref 0.0–0.2)

## 2021-12-15 LAB — GLUCOSE, CAPILLARY
Glucose-Capillary: 87 mg/dL (ref 70–99)
Glucose-Capillary: 183 mg/dL — ABNORMAL HIGH (ref 70–99)
Glucose-Capillary: 235 mg/dL — ABNORMAL HIGH (ref 70–99)
Glucose-Capillary: 186 mg/dL — ABNORMAL HIGH (ref 70–99)

## 2021-12-15 LAB — HEMOGLOBIN A1C
Hgb A1c MFr Bld: 5.4 % (ref 4.8–5.6)
Mean Plasma Glucose: 108.28 mg/dL

## 2021-12-15 MED ORDER — SODIUM CHLORIDE 0.9 % IV SOLN
510.0000 mg | Freq: Once | INTRAVENOUS | Status: AC
  Administered 2021-12-15: 510 mg via INTRAVENOUS
  Filled 2021-12-15: qty 17

## 2021-12-15 MED ORDER — FERROUS SULFATE 325 (65 FE) MG PO TABS
325.0000 mg | ORAL_TABLET | Freq: Every day | ORAL | Status: DC
  Administered 2021-12-16 – 2021-12-18 (×3): 325 mg via ORAL
  Filled 2021-12-15 (×4): qty 1

## 2021-12-15 MED ORDER — METOPROLOL SUCCINATE ER 25 MG PO TB24
12.5000 mg | ORAL_TABLET | Freq: Every day | ORAL | Status: DC
  Administered 2021-12-15 – 2021-12-18 (×4): 12.5 mg via ORAL
  Filled 2021-12-15 (×4): qty 1

## 2021-12-15 MED ORDER — METOPROLOL TARTRATE 5 MG/5ML IV SOLN: 2.5000 mg | Freq: Four times a day (QID) | INTRAVENOUS | Status: DC | PRN

## 2021-12-15 NOTE — Social Work (Signed)
CSW attempted to address recommendation of a SNF however pt did not awaken to name call. CSW will follow back up.

## 2021-12-15 NOTE — Evaluation (Addendum)
Physical Therapy Evaluation Patient Details Name: [name] MRN: [id number] DOB: [birth year] Today's Date: 12/15/2021  History of Present Illness  Pt is a 88 y.o. M who presents 12/13/2021 with fall and generalized weakness. Work up revealed symptomatic anemia with hemoglobin of 6.5. Pt found to have gastric ulcers causing anemia and melena. Significant PMH: CAD, severe aortic stenosis s/p TAV, HFpEF, moderate mitral regurgitation, type 2 diabetes, HTN, persistent af ib, PAD, CKD 3b, prior GIB, prostate CA.  Clinical Impression  Pt admitted with above. PTA, pt lives alone, uses a cane vs walker for mobility and is independent with ADL's. Pt reports 5 falls in past month. Pt received with large episode of bowel incontinence. Encouraged pt to utilize call bell to call for help as he is at high risk of skin breakdown and already has an area of maceration along gluteal cleft (MD present to observe). NT present to assist with peri care and linen change. Pt presents with generalized weakness, impaired balance and decreased activity tolerance. Requiring min assist for bed mobility and transferring to chair. SpO2 > 90% on 1.5L O2, BP 101/57 (64), HR 104-120's. Will continue to follow acutely to progress mobility as tolerated.     Recommendations for follow up therapy are one component of a multi-disciplinary discharge planning process, led by the attending physician.  Recommendations may be updated based on patient status, additional functional criteria and insurance authorization.  Follow Up Recommendations Skilled nursing-short term rehab (<3 hours/day) (pt likely to refuse, will need continued HHPT)    Assistance Recommended at Discharge Frequent or constant Supervision/Assistance  Patient can return home with the following  A little help with walking and/or transfers;A little help with bathing/dressing/bathroom;Assistance with cooking/housework;Help with stairs or ramp for entrance    Equipment  Recommendations None recommended by PT (pt well equipped)  Recommendations for Other Services       Functional Status Assessment Patient has had a recent decline in their functional status and demonstrates the ability to make significant improvements in function in a reasonable and predictable amount of time.     Precautions / Restrictions Precautions Precautions: Fall Restrictions Weight Bearing Restrictions: No      Mobility  Bed Mobility Overal bed mobility: Needs Assistance Bed Mobility: Rolling, Supine to Sit Rolling: Min assist   Supine to sit: Min guard     General bed mobility comments: Pt requiring minA for rolling to R/L for peri care, min guard for exiting towards left side of bed    Transfers Overall transfer level: Needs assistance Equipment used: None Transfers: Sit to/from Stand, Bed to chair/wheelchair/BSC Sit to Stand: Min assist Stand pivot transfers: Min assist         General transfer comment: MinA to rise and steady and pivot towards left. Pt with increased bilateral knee flexion in stance, mild dynamic instability    Ambulation/Gait                  Stairs            Wheelchair Mobility    Modified Rankin (Stroke Patients Only)       Balance Overall balance assessment: Needs assistance Sitting-balance support: Feet supported Sitting balance-Leahy Scale: Good     Standing balance support: Single extremity supported, During functional activity Standing balance-Leahy Scale: Poor                               Pertinent Vitals/Pain Pain  Assessment Pain Assessment: Faces Faces Pain Scale: Hurts a little bit Pain Location: bottom Pain Descriptors / Indicators: Discomfort Pain Intervention(s): Monitored during session    Home Living Family/patient expects to be discharged to:: Private residence Living Arrangements: Alone Available Help at Discharge: Family;Available PRN/intermittently Type of Home:  House Home Access: Stairs to enter Entrance Stairs-Rails: None Entrance Stairs-Number of Steps: 2   Home Layout: One level Home Equipment: Conservation officer, nature (2 wheels);Cane - single point;BSC/3in1;Shower seat;Wheelchair - manual Additional Comments: wife at [employer]    Prior Function Prior Level of Function : Independent/Modified Independent             Mobility Comments: uses walker vs cane, sleeps in recliner ADLs Comments: has Therapist, sports 1x/wk, HHPT     Hand Dominance        Extremity/Trunk Assessment   Upper Extremity Assessment Upper Extremity Assessment: RUE deficits/detail;LUE deficits/detail RUE Deficits / Details: ROM WFL LUE Deficits / Details: Shoulder flexion AROM to ~90 degrees    Lower Extremity Assessment Lower Extremity Assessment: Generalized weakness    Cervical / Trunk Assessment Cervical / Trunk Assessment: Kyphotic  Communication   Communication: No difficulties  Cognition Arousal/Alertness: Awake/alert Behavior During Therapy: WFL for tasks assessed/performed Overall Cognitive Status: Impaired/Different from baseline Area of Impairment: Safety/judgement                         Safety/Judgement: Decreased awareness of deficits              General Comments      Exercises     Assessment/Plan    PT Assessment Patient needs continued PT services  PT Problem List Decreased strength;Decreased activity tolerance;Decreased balance;Decreased mobility;Decreased cognition;Decreased safety awareness       PT Treatment Interventions DME instruction;Gait training;Stair training;Functional mobility training;Therapeutic activities;Therapeutic exercise;Balance training;Patient/family education    PT Goals (Current goals can be found in the Care Plan section)  Acute Rehab PT Goals Patient Stated Goal: get stronger PT Goal Formulation: With patient Time For Goal Achievement: 12/29/21 Potential to Achieve Goals: Good    Frequency Min  3X/week     Co-evaluation               AM-PAC PT "6 Clicks" Mobility  Outcome Measure Help needed turning from your back to your side while in a flat bed without using bedrails?: A Little Help needed moving from lying on your back to sitting on the side of a flat bed without using bedrails?: A Little Help needed moving to and from a bed to a chair (including a wheelchair)?: A Little Help needed standing up from a chair using your arms (e.g., wheelchair or bedside chair)?: A Little Help needed to walk in hospital room?: A Lot Help needed climbing 3-5 steps with a railing? : Total 6 Click Score: 15    End of Session Equipment Utilized During Treatment: Gait belt;Oxygen Activity Tolerance: Patient tolerated treatment well Patient left: in chair;with call bell/phone within reach;with chair alarm set Nurse Communication: Mobility status PT Visit Diagnosis: Unsteadiness on feet (R26.81);Muscle weakness (generalized) (M62.81);History of falling (Z91.81);Difficulty in walking, not elsewhere classified (R26.2)    Time: 7793-9030 PT Time Calculation (min) (ACUTE ONLY): 35 min   Charges:   PT Evaluation $PT Eval Moderate Complexity: 1 Mod PT Treatments $Therapeutic Activity: 8-22 mins        Wyona Almas, PT, DPT Acute Rehabilitation Services Pager [phone] Office [phone]   Deno Etienne 12/15/2021, 1:28 PM

## 2021-12-15 NOTE — Progress Notes (Signed)
Subjective: Patient states he had a bowel movement last night and today morning. Bowel movement reported as dark and liquid. He denies abdominal pain. He has been tolerating clear liquids and is requesting for thicker consistency fluid/soup/cream of wheat.  Objective: Vital signs in last 24 hours: Temp:  [97.4 F (36.3 C)-98.5 F (36.9 C)] 98.5 F (36.9 C) (02/11 0750) Pulse Rate:  [86-124] 124 (02/11 0750) Resp:  [16-25] 20 (02/11 0750) BP: (96-146)/(46-110) 131/75 (02/11 0839) SpO2:  [90 %-100 %] 90 % (02/11 0750) Weight change:  Last BM Date: 12/14/21  PE: Elderly, frail, multiple bruises and abrasions GENERAL: Awake, oriented, on oxygen via nasal cannula, slightly pale ABDOMEN: Nondistended, nontender EXTREMITIES: No edema  Lab Results: Results for orders placed or performed during the hospital encounter of 12/13/21 (from the past 48 hour(s))  POC occult blood, ED     Status: Abnormal   Collection Time: 12/13/21 11:13 AM  Result Value Ref Range   Fecal Occult Bld POSITIVE (A) NEGATIVE  Prepare RBC (crossmatch)     Status: None   Collection Time: 12/13/21 11:23 AM  Result Value Ref Range   Order Confirmation      ORDER PROCESSED BY BLOOD BANK Performed at [facility], [address], [place], [place] [address]   Urinalysis, Routine w reflex microscopic     Status: Abnormal   Collection Time: 12/13/21  1:42 PM  Result Value Ref Range   Color, Urine YELLOW YELLOW   APPearance CLEAR CLEAR   Specific Gravity, Urine 1.011 1.005 - 1.030   pH 5.0 5.0 - 8.0   Glucose, UA >=500 (A) NEGATIVE mg/dL   Hgb urine dipstick NEGATIVE NEGATIVE   Bilirubin Urine NEGATIVE NEGATIVE   Ketones, ur NEGATIVE NEGATIVE mg/dL   Protein, ur NEGATIVE NEGATIVE mg/dL   Nitrite NEGATIVE NEGATIVE   Leukocytes,Ua NEGATIVE NEGATIVE   RBC / HPF 0-5 0 - 5 RBC/hpf   WBC, UA 0-5 0 - 5 WBC/hpf   Bacteria, UA RARE (A) NONE SEEN   Hyaline Casts, UA PRESENT     Comment: Performed at [facility] [address], [place], [place] [address]  Protime-INR     Status: None   Collection Time: 12/13/21  2:55 PM  Result Value Ref Range   Prothrombin Time 15.2 11.4 - 15.2 seconds   INR 1.2 0.8 - 1.2    Comment: (NOTE) INR goal varies based on device and disease states. Performed at [facility], [place] [address], [place], [place] [address]   Brain natriuretic peptide     Status: Abnormal   Collection Time: 12/13/21  2:55 PM  Result Value Ref Range   B Natriuretic Peptide 768.6 (H) 0.0 - 100.0 pg/mL    Comment: Performed at [facility] [address], [place], [place] [address]  Troponin I (High Sensitivity)     Status: Abnormal   Collection Time: 12/13/21  8:29 PM  Result Value Ref Range   Troponin I (High Sensitivity) 83 (H) <18 ng/L    Comment: (NOTE) Elevated high sensitivity troponin I (hsTnI) values and significant  changes across serial measurements may suggest ACS but many other  chronic and acute conditions are known to elevate hsTnI results.  Refer to the "Links" section for chest pain algorithms and additional  guidance. Performed at [facility], [place] [address], [place], [place] [address]   CBC     Status: Abnormal   Collection Time: 12/13/21  8:29 PM  Result Value Ref Range   WBC 4.6 4.0 - 10.5 K/uL   RBC 2.33 (L) 4.22 - 5.81 MIL/uL   Hemoglobin 7.5 (L) 13.0 - 17.0 g/dL   HCT 22.7 (L) 39.0 - 52.0 %   MCV 97.4 80.0 - 100.0 fL   MCH 32.2 26.0 - 34.0 pg   MCHC 33.0 30.0 - 36.0 g/dL   RDW 16.6 (H) 11.5 - 15.5 %   Platelets 197 150 - 400 K/uL   nRBC 0.4 (H) 0.0 - 0.2 %    Comment: Performed at [facility] [address] [place], [place] [address]  Prepare RBC (crossmatch)     Status: None   Collection Time: 12/13/21 10:00 PM  Result Value Ref Range   Order Confirmation      ORDER PROCESSED BY BLOOD BANK Performed at [facility], [place] [address] [place], [place] [address]   Troponin I (High Sensitivity)     Status:  Abnormal   Collection Time: 12/13/21 11:01 PM  Result Value Ref Range   Troponin I (High Sensitivity) 76 (H) <18 ng/L    Comment: (NOTE) Elevated high sensitivity troponin I (hsTnI) values and significant  changes across serial measurements may suggest ACS but many other  chronic and acute conditions are known to elevate hsTnI results.  Refer to the "Links" section for chest pain algorithms and additional  guidance. Performed at [facility], [place] [address] [place], [place] [address]   CBC     Status: Abnormal   Collection Time: 12/14/21  5:45 AM  Result Value Ref Range   WBC 5.5 4.0 - 10.5 K/uL   RBC 2.78 (L) 4.22 - 5.81 MIL/uL   Hemoglobin 8.7 (L) 13.0 - 17.0 g/dL   HCT 26.6 (L) 39.0 - 52.0 %   MCV 95.7 80.0 - 100.0 fL   MCH 31.3 26.0 - 34.0 pg   MCHC 32.7 30.0 - 36.0 g/dL   RDW 16.8 (H) 11.5 - 15.5 %   Platelets 204 150 - 400 K/uL   nRBC 0.5 (H) 0.0 - 0.2 %    Comment: Performed at [facility], [place] [address] [place], [place] [address]  Comprehensive metabolic panel     Status: Abnormal   Collection Time: 12/14/21  5:45 AM  Result Value Ref Range   Sodium 141 135 - 145 mmol/L   Potassium 4.2 3.5 - 5.1 mmol/L   Chloride 106 98 - 111 mmol/L   CO2 25 22 - 32 mmol/L   Glucose, Bld 117 (H) 70 - 99 mg/dL    Comment: Glucose reference range applies only to samples taken after fasting for at least 8 hours.   BUN 65 (H) 8 - 23 mg/dL   Creatinine, Ser 1.56 (H) 0.61 - 1.24 mg/dL   Calcium 8.8 (L) 8.9 - 10.3 mg/dL   Total Protein 5.3 (L) 6.5 - 8.1 g/dL   Albumin 2.8 (L) 3.5 - 5.0 g/dL   AST 29 15 - 41 U/L   ALT 17 0 - 44 U/L   Alkaline Phosphatase 58 38 - 126 U/L   Total Bilirubin 1.4 (H) 0.3 - 1.2 mg/dL   GFR, Estimated 43 (L) >60 mL/min    Comment: (NOTE) Calculated using the CKD-EPI Creatinine Equation (2021)    Anion gap 10 5 - 15    Comment: Performed at [facility], [place] [address], [place], [place] [address]  Magnesium     Status: None  Collection Time: 12/14/21  5:45 AM  Result Value Ref Range   Magnesium 2.1 1.7 - 2.4 mg/dL    Comment: Performed at [facility], [place] [address] [place], [place] [address]  Phosphorus     Status: Abnormal   Collection Time: 12/14/21  5:45 AM  Result Value Ref Range   Phosphorus 5.0 (H) 2.5 - 4.6 mg/dL    Comment: Performed at [facility] [address], [place], [place] [address]  Glucose, capillary     Status: Abnormal   Collection Time: 12/14/21 10:35 AM  Result Value Ref Range   Glucose-Capillary 110 (H) 70 - 99 mg/dL    Comment: Glucose reference range applies only to samples taken after fasting for at least 8 hours.  Glucose, capillary     Status: None   Collection Time: 12/14/21  6:42 PM  Result Value Ref Range   Glucose-Capillary 95 70 - 99 mg/dL    Comment: Glucose reference range applies only to samples taken after fasting for at least 8 hours.  MRSA Next Gen by PCR, Nasal     Status: None   Collection Time: 12/14/21  7:21 PM   Specimen: Nasal Mucosa; Nasal Swab  Result Value Ref Range   MRSA by PCR Next Gen NOT DETECTED NOT DETECTED    Comment: (NOTE) The GeneXpert MRSA Assay (FDA approved for NASAL specimens only), is one component of a comprehensive MRSA colonization surveillance program. It is not intended to diagnose MRSA infection nor to guide or monitor treatment for MRSA infections. Test performance is not FDA approved in patients less than 44 years old. Performed at [facility], [place] [address] [place], [place] [address]   Glucose, capillary     Status: None   Collection Time: 12/14/21  8:36 PM  Result Value Ref Range   Glucose-Capillary 91 70 - 99 mg/dL    Comment: Glucose reference range applies only to samples taken after fasting for at least 8 hours.  Hemoglobin A1c     Status: None   Collection Time: 12/15/21 12:44 AM  Result Value Ref Range   Hgb A1c MFr Bld 5.4 4.8 - 5.6 %    Comment: (NOTE) Pre diabetes:           5.7%-6.4%  Diabetes:              >6.4%  Glycemic control for   <7.0% adults with diabetes    Mean Plasma Glucose 108.28 mg/dL    Comment: Performed at [facility] [address] [place], [place] [address]  CBC     Status: Abnormal   Collection Time: 12/15/21  5:48 AM  Result Value Ref Range   WBC 7.9 4.0 - 10.5 K/uL   RBC 2.95 (L) 4.22 - 5.81 MIL/uL   Hemoglobin 9.2 (L) 13.0 - 17.0 g/dL   HCT 29.5 (L) 39.0 - 52.0 %   MCV 100.0 80.0 - 100.0 fL   MCH 31.2 26.0 - 34.0 pg   MCHC 31.2 30.0 - 36.0 g/dL   RDW 17.0 (H) 11.5 - 15.5 %   Platelets 215 150 - 400 K/uL   nRBC 0.4 (H) 0.0 - 0.2 %    Comment: Performed at [facility], [place] [address] [place], [place] [address]  Basic metabolic panel     Status: Abnormal   Collection Time: 12/15/21  5:48 AM  Result Value Ref Range   Sodium 141 135 - 145 mmol/L  Potassium 4.2 3.5 - 5.1 mmol/L   Chloride 105 98 - 111 mmol/L   CO2 20 (L) 22 - 32 mmol/L   Glucose, Bld 88 70 - 99 mg/dL    Comment: Glucose reference range applies only to samples taken after fasting for at least 8 hours.   BUN 50 (H) 8 - 23 mg/dL   Creatinine, Ser 1.44 (H) 0.61 - 1.24 mg/dL   Calcium 8.7 (L) 8.9 - 10.3 mg/dL   GFR, Estimated 47 (L) >60 mL/min    Comment: (NOTE) Calculated using the CKD-EPI Creatinine Equation (2021)    Anion gap 16 (H) 5 - 15    Comment: Performed at [facility] [address], [place], [place] [address]  Hepatic function panel     Status: Abnormal   Collection Time: 12/15/21  5:48 AM  Result Value Ref Range   Total Protein 5.5 (L) 6.5 - 8.1 g/dL   Albumin 2.8 (L) 3.5 - 5.0 g/dL   AST 29 15 - 41 U/L   ALT 20 0 - 44 U/L   Alkaline Phosphatase 67 38 - 126 U/L   Total Bilirubin 1.7 (H) 0.3 - 1.2 mg/dL   Bilirubin, Direct 0.5 (H) 0.0 - 0.2 mg/dL   Indirect Bilirubin 1.2 (H) 0.3 - 0.9 mg/dL    Comment: Performed at [facility] [address], [place], [place] [address]  Glucose, capillary     Status: None    Collection Time: 12/15/21  6:15 AM  Result Value Ref Range   Glucose-Capillary 87 70 - 99 mg/dL    Comment: Glucose reference range applies only to samples taken after fasting for at least 8 hours.   Comment 1 Notify RN     Studies/Results: DG Chest Portable 1 View  Result Date: 12/13/2021 CLINICAL DATA:  Shortness of breath in an 88 year old male. EXAM: PORTABLE CHEST 1 VIEW COMPARISON:  October 01, 2021. FINDINGS: EKG leads project over the chest. Trachea is midline. Cardiomediastinal contours mild cardiac enlargement unchanged compared to recent imaging. Mild interstitial prominence is noted without frank signs of pulmonary edema. No lobar consolidation or sign of pleural fluid. No pneumothorax. On limited assessment there is no acute skeletal process. Marked LEFT glenohumeral degenerative changes are noted. IMPRESSION: Stable mild cardiac enlargement. Mild interstitial prominence slightly asymmetric could reflect changes of early CHF or atypical infection. Electronically Signed   By: Zetta Bills M.D.   On: 12/13/2021 11:50    Medications: I have reviewed the patient's current medications.  Assessment: Gastric ulcers causing anemia and melena Biopsies taken, results pending Hemoglobin improved to 9.2, 1 unit PRBC given on 2/9, 1 unit PRBC see given on 2/10  Biopsy from EGD from 10/03/2021: Chronic gastritis positive for H. Pylori?  Untreated  BUN trending down Low albumin 2.8, low total protein 5.5 T. bili 1.7, indirect 1.2?  Gilbert's  Severe iron deficiency, iron saturation 8% on 10/04/2021  Plan: Advance diet to full liquids Continue pantoprazole 40 mg every 12 hours Will await for biopsy results GU likely related to untreated H. Pylori. After biopsies from EGD from yesterday are available, he will likely need treatment for H. pylori and continuation of PPI twice a day for at least another 2 months.  [name], MD 12/15/2021, 10:38 AM

## 2021-12-15 NOTE — Progress Notes (Signed)
History and Physical  [name] OTL:[id number] DOB: [birth year] DOA: 12/13/2021  Referring physician: Dr. Lavone Orn, [place]  PCP: Aaron Pepper, MD  Outpatient Specialists: Cardiology Patient coming from: Home. Chief Complaint: Generalized weakness, fall last night.  HPI: [name] is a 88 y.o. male with medical history significant for coronary artery calcification, severe aortic stenosis status post TAVR, HFpEF, moderate mitral regurgitation, moderate tricuspid regurgitation, type 2 diabetes, essential hypertension, persistent atrial fibrillation not on oral anticoagulation, peripheral artery disease, CKD 3B, prior GI bleed with gastric ulcers who presented to [facility] ED with complaints of generalized weakness and a fall last night.  Patient denies any chest pain, dyspnea, or palpitations prior to his fall.  Denies tripping on any objects.  There has been no changes in his appetite.  He denies use of NSAIDs.  Endorses associated dark stools for the past 1 week.  He recalls hitting his head during the fall.  He was brought into the ED today from home for further evaluation and management of his symptomatology.  Work-up in the ED revealed symptomatic anemia with hemoglobin of 6.5K and positive FOBT.  1 unit of PRBCs was ordered to be transfused by EDP.  GI was also consulted by EDP.  TRH, hospitalist service, was asked to admit.  At the time of this evaluation, patient denies any abdominal pain except when palpated, tenderness is localized in the right upper quadrant.  Last bowel movement was on 12/12/2021, semiformed and dark.  Patient was started on IV Protonix 40 mg twice daily in the ED.  UA and COVID-19 screening tests all negative.  12/15/21: Seen and examined at his bedside.  Endorses shortness of breath with minimal exertion.  He denies any chest pain however admits to palpitations while in A-fib and RVR.  BP stable, home Toprol-XL restarted at lower dose 12.5 mg daily.  Reported dark and liquid  stools.  Hemoglobin is uptrending.  Diet advanced to full liquids from clear liquid by GI.  Appreciate GIs assistance.   Past Medical History:  Diagnosis Date   Anemia    LOW PLATELETS OTHER DAY  PER PT   Anticoagulated on Coumadin 12/21/2012   Aortic stenosis    Arthritis    "left wrist; back sometimes" (01/21/2013)   Atrial fibrillation ([place])    [name], DR COOPER   Bradycardia    Exertional shortness of breath    Heart murmur    "I've had it for years; runs in the family on daddy's side" (01/21/2013)   HTN (hypertension)    Hyperlipemia 12/21/2012   Prostate cancer ([place])    "had 40 tx of radiation in 2009" (01/21/2013)   S/P aortic valve replacement with bioprosthetic valve 01/19/2013   Transcatheter Aortic Valve Replacement using 46mm Sapien bioprosthetic tissue valve via transapical approach   Type II diabetes mellitus ([place])    Past Surgical History:  Procedure Laterality Date   BALLOON DILATION N/A 08/02/2020   Procedure: BALLOON DILATION;  Surgeon: Aaron Juniper, MD;  Location: [facility];  Service: Gastroenterology;  Laterality: N/A;   BIOPSY  10/03/2021   Procedure: BIOPSY;  Surgeon: Aaron Essex, MD;  Location: [facility];  Service: Endoscopy;;   CARDIAC CATHETERIZATION  12/16/2012   CARDIAC VALVE REPLACEMENT  01/19/2013   AVR   CATARACT EXTRACTION W/ INTRAOCULAR LENS  IMPLANT, BILATERAL     ESOPHAGOGASTRODUODENOSCOPY N/A 10/03/2021   Procedure: ESOPHAGOGASTRODUODENOSCOPY (EGD);  Surgeon: Aaron Essex, MD;  Location: [facility];  Service: Endoscopy;  Laterality: N/A;   ESOPHAGOGASTRODUODENOSCOPY (  EGD) WITH PROPOFOL N/A 08/02/2020   Procedure: ESOPHAGOGASTRODUODENOSCOPY (EGD) WITH PROPOFOL;  Surgeon: Aaron Juniper, MD;  Location: [facility];  Service: Gastroenterology;  Laterality: N/A;   EYE SURGERY     INTRAOPERATIVE TRANSESOPHAGEAL ECHOCARDIOGRAM N/A 01/19/2013   Procedure: INTRAOPERATIVE TRANSESOPHAGEAL ECHOCARDIOGRAM;  Surgeon: Aaron Alberts, MD;  Location: [facility];   Service: Open Heart Surgery;  Laterality: N/A;   ORIF FOREARM FRACTURE Left 1952   "compound fx" (01/21/2013)   TRANSTHORACIC ECHOCARDIOGRAM  09/04/10, 09/07/08    Social History:  reports that he quit smoking about 45 years ago. His smoking use included cigarettes. He has a 44.00 pack-year smoking history. He has never used smokeless tobacco. He reports that he does not drink alcohol and does not use drugs.   Allergies  Allergen Reactions   Amoxicillin Other (See Comments)    Burning of mouth, tongue and lips.  itchy, burning throat    Family History  Problem Relation Age of Onset   Diabetes Mother    Melanoma Mother 66   Bone cancer Mother 71   Pancreatic cancer Father 50   Heart disease Father    Stroke Sister    Cancer Brother 63       ? brain cancer      Prior to Admission medications   Medication Sig Start Date End Date Taking? Authorizing Provider  ACCU-CHEK AVIVA PLUS test strip  05/23/18   [provider]  ACCU-CHEK SOFTCLIX LANCETS lancets daily. use as directed 04/07/18   [provider]  Acetaminophen (TYLENOL ARTHRITIS PAIN PO) Take 650 mg by mouth daily with breakfast.    [provider]  Amino Acids-Protein Hydrolys (FEEDING SUPPLEMENT, PRO-STAT SUGAR FREE 64,) LIQD Take 30 mLs by mouth 2 (two) times daily. Patient not taking: Reported on 11/08/2021    [provider]  aspirin EC 81 MG tablet Take 1 tablet (81 mg total) by mouth daily. Swallow whole. 11/19/21   [name], MD  B Complex Vitamins (B COMPLEX 50) TABS Take 1 tablet by mouth daily with breakfast.    [provider]  bisacodyl (DULCOLAX) 10 MG suppository Place rectally as needed for moderate constipation. If not relieved by MOM, give 10 mg Bisacodyl suppositiory in 24 hours as needed Patient not taking: Reported on 11/08/2021    [provider]  carvedilol (COREG) 12.5 MG tablet Take 12.5 mg by mouth 2 (two) times daily. 11/08/21   [provider]  cholecalciferol (VITAMIN D3) 25 MCG (1000 UNIT) tablet Take 1,000 Units by mouth 2 (two) times daily.    [provider]  dapagliflozin propanediol (FARXIGA) 10 MG TABS tablet Take 1 tablet (10 mg total) by mouth daily before breakfast. 10/23/21   Medina-Vargas, Monina C, NP  doxazosin (CARDURA) 8 MG tablet Take 1 tablet (8 mg total) by mouth daily. 10/23/21   Medina-Vargas, Monina C, NP  enzalutamide (XTANDI) 40 MG capsule Take 4 capsules (160 mg total) by mouth daily at 2 PM. 10/23/21   Medina-Vargas, Monina C, NP  ferrous sulfate 325 (65 FE) MG tablet Take 325 mg by mouth See admin instructions. Take one tablet (325 mg) by mouth twice daily - with lunch and supper    [provider]  gabapentin (NEURONTIN) 100 MG capsule Take 1 capsule (100 mg total) by mouth at bedtime. 10/23/21   Medina-Vargas, Monina C, NP  lisinopril (ZESTRIL) 20 MG tablet Take 1 tablet (20 mg total) by mouth daily. 10/23/21   Medina-Vargas, Senaida Lange, NP  magnesium hydroxide (MILK OF MAGNESIA) 400 MG/5ML suspension Take by mouth daily as needed for mild constipation. If no BM in 3 days, give 30 cc Milk of Magnesium in 24 hours as needed Patient not taking: Reported on 11/08/2021    [provider]  metFORMIN (GLUCOPHAGE) 1000 MG tablet Take 1 tablet (1,000 mg total) by mouth 2 (two) times daily with breakfast and lunch. 10/23/21   Medina-Vargas, Monina C, NP  metoprolol succinate (TOPROL-XL) 50 MG 24 hr tablet Take 1 tablet (50 mg total) by mouth daily. Take with or immediately following a meal. 10/23/21   Medina-Vargas, Monina C, NP  pantoprazole (PROTONIX) 40 MG tablet Take 1 tablet (40 mg total) by mouth daily. 10/23/21   Medina-Vargas, Monina C, NP  rosuvastatin (CRESTOR) 5 MG tablet TAKE 1 TABLET BY MOUTH ONCE DAILY AT  6PM 10/23/21   Medina-Vargas, Monina C, NP  Selenium 200 MCG CAPS Take 200 mcg by mouth daily with breakfast.    [provider]  simethicone (MYLICON) 80  MG chewable tablet Chew 1 tablet (80 mg total) by mouth 4 (four) times daily as needed for flatulence. Patient not taking: Reported on 11/08/2021 10/08/21   Lavina Hamman, MD  Sodium Phosphates (RA SALINE ENEMA RE) If not relieved by Biscodyl suppository, give disposable Saline Enema rectally x1 dose 24 hrs as needed Patient not taking: Reported on 11/08/2021    [provider]  torsemide (DEMADEX) 20 MG tablet Take 2 tablets (40 mg total) by mouth 2 (two) times daily. 11/23/21 02/21/22  [name], MD  verapamil (CALAN-SR) 180 MG CR tablet Take 180 mg by mouth daily. 11/08/21   [provider]  Vitamin A 2400 MCG (8000 UT) TABS Take 2,400 mcg by mouth daily with breakfast.    [provider]  vitamin B-12 (CYANOCOBALAMIN) 1000 MCG tablet Take 1,000 mcg by mouth daily.    [provider]  vitamin C (ASCORBIC ACID) 500 MG tablet Take 500 mg by mouth 2 (two) times daily.    [provider]  zinc gluconate 50 MG tablet Take 50 mg by mouth daily with breakfast.    [provider]    Physical Exam: BP (!) 101/57 (BP Location: Right Arm)    Pulse (!) 110    Temp 98.6 F (37 C) (Oral)    Resp (!) 21    SpO2 100%   General: 88 y.o. year-old male frail-appearing in no acute distress.  He is alert and oriented x3.   Cardiovascular: Irregular rate and rhythm no rubs or gallops.   Respiratory: Clear to auscultation no wheezes or rales. Abdomen: Soft nontender normal bowel sounds present. Muskuloskeletal: Trace lower extremity edema bilaterally.   Neuro: Moves all 4 extremities. Skin: No ulcers or lesions noted. Psychiatry: Mood is appropriate for condition and setting.         Labs on Admission:  Basic Metabolic Panel: Recent Labs  Lab 12/13/21 0726 12/14/21 0545 12/15/21 0548  NA 139 141 141  K 4.8 4.2 4.2  CL 101 106 105  CO2 24 25 20*  GLUCOSE 129* 117* 88  BUN 82* 65* 50*  CREATININE 1.98* 1.56* 1.44*  CALCIUM 9.0 8.8* 8.7*   MG  --  2.1  --   PHOS  --  5.0*  --    Liver Function Tests: Recent Labs  Lab 12/13/21 0726 12/14/21 0545 12/15/21 0548  AST 30 29 29   ALT 17 17 20   ALKPHOS 58 58 67  BILITOT  0.7 1.4* 1.7*  PROT 5.7* 5.3* 5.5*  ALBUMIN 3.1* 2.8* 2.8*   No results for input(s): LIPASE, AMYLASE in the last 168 hours. No results for input(s): AMMONIA in the last 168 hours. CBC: Recent Labs  Lab 12/13/21 0726 12/13/21 2029 12/14/21 0545 12/15/21 0548  WBC 4.8 4.6 5.5 7.9  NEUTROABS 3.2  --   --   --   HGB 6.5* 7.5* 8.7* 9.2*  HCT 20.9* 22.7* 26.6* 29.5*  MCV 99.1 97.4 95.7 100.0  PLT 208 197 204 215   Cardiac Enzymes: No results for input(s): CKTOTAL, CKMB, CKMBINDEX, TROPONINI in the last 168 hours.  BNP (last 3 results) Recent Labs    12/13/21 1455  BNP 768.6*    ProBNP (last 3 results) Recent Labs    12/05/21 0915  PROBNP 6,343*    CBG: Recent Labs  Lab 12/14/21 1035 12/14/21 1842 12/14/21 2036 12/15/21 0615 12/15/21 1112  GLUCAP 110* 95 91 87 183*    Radiological Exams on Admission: No results found.  EKG: I independently viewed the EKG done and my findings are as followed: Atrial fibrillation rate of 87 with PVCs.  Assessment/Plan Present on Admission: **None**  Principal Problem:   Weakness  Generalized weakness, multifactorial secondary to symptomatic anemia, AKI, acute illness. Treat underlying conditions. Presented with hemoglobin of 6.5 and positive FOBT, 1 unit PRBC ordered to be transfused in the ED. Repeat CBC posttransfusion Continue IV Protonix 40 mg twice daily started in the ED. Hold off any anticoagulation or antiplatelets for now. GI following PT OT to assess, continue fall precautions.  Permanent A-fib with RVR Not oral anticoagulation prior to admission Restarted home Toprol-XL at lower dose 12.5 mg daily to avoid hypotension. Continue to closely monitor on telemetry. Obtain twelve-lead EKG.  Improving acute blood loss anemia  with suspected upper GI bleed with history of gastric ulcers seen on EGD done on 10/03/2021 by Dr. Watt Climes. Maintain hemoglobin above 8 due to underlying cardiac history. Hemoglobin stable and uptrending 9.2 from 8.7 from 7.5, post 1 unit PRBC transfusion. Post EGD by Dr. Michail Sermon, it revealed oozing gastric ulcers with pigmented material. Biopsied. Acute gastritis. Biopsied.  GI recommended to continue IV Protonix 40 mg a day.  Bleeding gastric ulcers/acute gastritis seen on EGD done on 12/14/2018 by Dr. Michail Sermon, GI. Continue IV Protonix 40 mg twice daily as recommended by GI Closely monitor H&H. Continue to maintain MAP greater than 65.  Resolving AKI on CKD 3B Baseline creatinine appears to be 1.4 with GFR of 47. Presented with creatinine of 1.98 with GFR of 32 Renal function is back to baseline. Continue to avoid nephrotoxic agents, and hypotension. Continue to monitor urine output with strict I's and O's Repeat renal panel in the morning.  Severe iron deficiency anemia Last iron studies done on 10/04/2021 showed severe iron deficiency Hold off oral iron replacement for now due to possible EGD. Resume oral iron supplement when okay with GI. We will transfuse IV iron Feraheme on 12/15/2021.  Chronic diastolic CHF Last 2D echo done on 11/11/2021 revealed LVEF 60 to 65% with no regional wall motion abnormalities.  Left atrial size and right atrial size was severely dilated.  There was severely elevated pulmonary artery systolic pressure. Continue strict I's and O's and daily weight. Hold off home ACE inhibitor due to soft BPs to avoid hypotension in the setting of GI bleed. Toprol-XL was restarted on 12/15/2021 due to A-fib with RVR.  BPH Continue to hold off home Cardura for now to  avoid hypotension.  Hyperlipidemia Continue to hold off Crestor for now.  Type 2 diabetes with hyperglycemia Hemoglobin A1c 5.4 on 12/15/2021. Avoid hypoglycemia.  Recurrent fall Per ED staff, family  reports frequent falls at home PT OT assessment on 12/14/2021. Orthostatic vital signs on 12/14/2021. Continue fall precautions.   Critical care time: 65 minutes.       DVT prophylaxis: SCDs  Code Status: Full code  Family Communication: None at bedside  Disposition Plan: Likely will discharge to home with home health services once GI signs off.  Currently being monitored in the setting of recent GI bleed with oozing gastric ulcer seen on EGD.  Consults called: GI consulted by EDP.  Admission status: Inpatient status.   Status is: Inpatient  Patient requires at least 2 midnights for further evaluation and treatment of present condition.     Kayleen Memos MD [facility] Pager [phone]  If 7PM-7AM, please contact night-coverage [email or web address] Password [facility]  12/15/2021, 12:11 PM

## 2021-12-16 ENCOUNTER — Encounter (HOSPITAL_COMMUNITY): Payer: Self-pay | Admitting: Gastroenterology

## 2021-12-16 LAB — CBC
HCT: 26.9 % — ABNORMAL LOW (ref 39.0–52.0)
Hemoglobin: 8.7 g/dL — ABNORMAL LOW (ref 13.0–17.0)
MCH: 31.5 pg (ref 26.0–34.0)
MCHC: 32.3 g/dL (ref 30.0–36.0)
MCV: 97.5 fL (ref 80.0–100.0)
Platelets: 181 10*3/uL (ref 150–400)
RBC: 2.76 MIL/uL — ABNORMAL LOW (ref 4.22–5.81)
RDW: 16.9 % — ABNORMAL HIGH (ref 11.5–15.5)
WBC: 7.3 10*3/uL (ref 4.0–10.5)
nRBC: 0.3 % — ABNORMAL HIGH (ref 0.0–0.2)

## 2021-12-16 LAB — GLUCOSE, CAPILLARY
Glucose-Capillary: 153 mg/dL — ABNORMAL HIGH (ref 70–99)
Glucose-Capillary: 173 mg/dL — ABNORMAL HIGH (ref 70–99)
Glucose-Capillary: 229 mg/dL — ABNORMAL HIGH (ref 70–99)
Glucose-Capillary: 187 mg/dL — ABNORMAL HIGH (ref 70–99)

## 2021-12-16 LAB — BASIC METABOLIC PANEL
Anion gap: 8 (ref 5–15)
BUN: 44 mg/dL — ABNORMAL HIGH (ref 8–23)
CO2: 23 mmol/L (ref 22–32)
Calcium: 8.4 mg/dL — ABNORMAL LOW (ref 8.9–10.3)
Chloride: 106 mmol/L (ref 98–111)
Creatinine, Ser: 1.24 mg/dL (ref 0.61–1.24)
GFR, Estimated: 56 mL/min — ABNORMAL LOW (ref >60)
Glucose, Bld: 178 mg/dL — ABNORMAL HIGH (ref 70–99)
Potassium: 3.7 mmol/L (ref 3.5–5.1)
Sodium: 137 mmol/L (ref 135–145)

## 2021-12-16 MED ORDER — CLARITHROMYCIN 500 MG PO TABS
500.0000 mg | ORAL_TABLET | Freq: Two times a day (BID) | ORAL | Status: DC
  Administered 2021-12-16 – 2021-12-18 (×5): 500 mg via ORAL
  Filled 2021-12-16 (×6): qty 1

## 2021-12-16 MED ORDER — METRONIDAZOLE 500 MG PO TABS
250.0000 mg | ORAL_TABLET | Freq: Three times a day (TID) | ORAL | Status: DC
Start: 2021-12-16 — End: 2021-12-18
  Administered 2021-12-16 – 2021-12-18 (×6): 250 mg via ORAL
  Filled 2021-12-16 (×6): qty 1

## 2021-12-16 NOTE — Progress Notes (Signed)
Subjective: Patient reports last bowel movement was brown. Denies abdominal pain.  Objective: Vital signs in last 24 hours: Temp:  [97.5 F (36.4 C)-98.6 F (37 C)] 98.5 F (36.9 C) (02/12 1100) Pulse Rate:  [82-114] 100 (02/12 1100) Resp:  [13-19] 19 (02/12 1200) BP: (87-111)/(52-67) 108/53 (02/12 1200) SpO2:  [96 %-100 %] 97 % (02/12 1100) Weight change:  Last BM Date: 12/15/21  PE: Elderly, multiple bruises and aberrations GENERAL: Mild pallor, not in distress, oriented x3  ABDOMEN: Nondistended, soft, normoactive bowel sounds EXTREMITIES: No edema  Lab Results: Results for orders placed or performed during the hospital encounter of 12/13/21 (from the past 48 hour(s))  Glucose, capillary     Status: None   Collection Time: 12/14/21  6:42 PM  Result Value Ref Range   Glucose-Capillary 95 70 - 99 mg/dL    Comment: Glucose reference range applies only to samples taken after fasting for at least 8 hours.  MRSA Next Gen by PCR, Nasal     Status: None   Collection Time: 12/14/21  7:21 PM   Specimen: Nasal Mucosa; Nasal Swab  Result Value Ref Range   MRSA by PCR Next Gen NOT DETECTED NOT DETECTED    Comment: (NOTE) The GeneXpert MRSA Assay (FDA approved for NASAL specimens only), is one component of a comprehensive MRSA colonization surveillance program. It is not intended to diagnose MRSA infection nor to guide or monitor treatment for MRSA infections. Test performance is not FDA approved in patients less than 2 years old. Performed at [facility], [place] [address] [place], [place] [address]   Glucose, capillary     Status: None   Collection Time: 12/14/21  8:36 PM  Result Value Ref Range   Glucose-Capillary 91 70 - 99 mg/dL    Comment: Glucose reference range applies only to samples taken after fasting for at least 8 hours.  Hemoglobin A1c     Status: None   Collection Time: 12/15/21 12:44 AM  Result Value Ref Range   Hgb A1c MFr Bld 5.4 4.8 - 5.6 %     Comment: (NOTE) Pre diabetes:          5.7%-6.4%  Diabetes:              >6.4%  Glycemic control for   <7.0% adults with diabetes    Mean Plasma Glucose 108.28 mg/dL    Comment: Performed at [facility] [address], [place], [place] [address]  CBC     Status: Abnormal   Collection Time: 12/15/21  5:48 AM  Result Value Ref Range   WBC 7.9 4.0 - 10.5 K/uL   RBC 2.95 (L) 4.22 - 5.81 MIL/uL   Hemoglobin 9.2 (L) 13.0 - 17.0 g/dL   HCT 29.5 (L) 39.0 - 52.0 %   MCV 100.0 80.0 - 100.0 fL   MCH 31.2 26.0 - 34.0 pg   MCHC 31.2 30.0 - 36.0 g/dL   RDW 17.0 (H) 11.5 - 15.5 %   Platelets 215 150 - 400 K/uL   nRBC 0.4 (H) 0.0 - 0.2 %    Comment: Performed at [facility], [place] [address], [place], [place] [address]  Basic metabolic panel     Status: Abnormal   Collection Time: 12/15/21  5:48 AM  Result Value Ref Range   Sodium 141 135 - 145 mmol/L   Potassium 4.2 3.5 - 5.1 mmol/L   Chloride 105 98 - 111 mmol/L   CO2 20 (L)  22 - 32 mmol/L   Glucose, Bld 88 70 - 99 mg/dL    Comment: Glucose reference range applies only to samples taken after fasting for at least 8 hours.   BUN 50 (H) 8 - 23 mg/dL   Creatinine, Ser 1.44 (H) 0.61 - 1.24 mg/dL   Calcium 8.7 (L) 8.9 - 10.3 mg/dL   GFR, Estimated 47 (L) >60 mL/min    Comment: (NOTE) Calculated using the CKD-EPI Creatinine Equation (2021)    Anion gap 16 (H) 5 - 15    Comment: Performed at [facility] [address], [place], [place] [address]  Hepatic function panel     Status: Abnormal   Collection Time: 12/15/21  5:48 AM  Result Value Ref Range   Total Protein 5.5 (L) 6.5 - 8.1 g/dL   Albumin 2.8 (L) 3.5 - 5.0 g/dL   AST 29 15 - 41 U/L   ALT 20 0 - 44 U/L   Alkaline Phosphatase 67 38 - 126 U/L   Total Bilirubin 1.7 (H) 0.3 - 1.2 mg/dL   Bilirubin, Direct 0.5 (H) 0.0 - 0.2 mg/dL   Indirect Bilirubin 1.2 (H) 0.3 - 0.9 mg/dL    Comment: Performed at [facility] [address] [place], [place] [address]   Glucose, capillary     Status: None   Collection Time: 12/15/21  6:15 AM  Result Value Ref Range   Glucose-Capillary 87 70 - 99 mg/dL    Comment: Glucose reference range applies only to samples taken after fasting for at least 8 hours.   Comment 1 Notify RN   Glucose, capillary     Status: Abnormal   Collection Time: 12/15/21 11:12 AM  Result Value Ref Range   Glucose-Capillary 183 (H) 70 - 99 mg/dL    Comment: Glucose reference range applies only to samples taken after fasting for at least 8 hours.  Glucose, capillary     Status: Abnormal   Collection Time: 12/15/21  4:18 PM  Result Value Ref Range   Glucose-Capillary 235 (H) 70 - 99 mg/dL    Comment: Glucose reference range applies only to samples taken after fasting for at least 8 hours.  Glucose, capillary     Status: Abnormal   Collection Time: 12/15/21  9:07 PM  Result Value Ref Range   Glucose-Capillary 186 (H) 70 - 99 mg/dL    Comment: Glucose reference range applies only to samples taken after fasting for at least 8 hours.  CBC     Status: Abnormal   Collection Time: 12/16/21  1:02 AM  Result Value Ref Range   WBC 7.3 4.0 - 10.5 K/uL   RBC 2.76 (L) 4.22 - 5.81 MIL/uL   Hemoglobin 8.7 (L) 13.0 - 17.0 g/dL   HCT 26.9 (L) 39.0 - 52.0 %   MCV 97.5 80.0 - 100.0 fL   MCH 31.5 26.0 - 34.0 pg   MCHC 32.3 30.0 - 36.0 g/dL   RDW 16.9 (H) 11.5 - 15.5 %   Platelets 181 150 - 400 K/uL   nRBC 0.3 (H) 0.0 - 0.2 %    Comment: Performed at [facility] [address], [place], [place] [address]  Basic metabolic panel     Status: Abnormal   Collection Time: 12/16/21  1:02 AM  Result Value Ref Range   Sodium 137 135 - 145 mmol/L   Potassium 3.7 3.5 - 5.1 mmol/L   Chloride 106 98 - 111 mmol/L   CO2  23 22 - 32 mmol/L   Glucose, Bld 178 (H) 70 - 99 mg/dL    Comment: Glucose reference range applies only to samples taken after fasting for at least 8 hours.   BUN 44 (H) 8 - 23 mg/dL   Creatinine, Ser 1.24 0.61 - 1.24 mg/dL    Calcium 8.4 (L) 8.9 - 10.3 mg/dL   GFR, Estimated 56 (L) >60 mL/min    Comment: (NOTE) Calculated using the CKD-EPI Creatinine Equation (2021)    Anion gap 8 5 - 15    Comment: Performed at [facility] [address], [place], [place] [address]  Glucose, capillary     Status: Abnormal   Collection Time: 12/16/21  6:06 AM  Result Value Ref Range   Glucose-Capillary 153 (H) 70 - 99 mg/dL    Comment: Glucose reference range applies only to samples taken after fasting for at least 8 hours.  Glucose, capillary     Status: Abnormal   Collection Time: 12/16/21 11:26 AM  Result Value Ref Range   Glucose-Capillary 173 (H) 70 - 99 mg/dL    Comment: Glucose reference range applies only to samples taken after fasting for at least 8 hours.    Studies/Results: No results found.  Medications: I have reviewed the patient's current medications.  Assessment: Multiple gastric ulcers, melena, anemia  Hemoglobin stable at 8.7, was 9.2 BUN trending down Iron saturation 8%  H. pylori positive on 10/03/2021, no documentation of prior treatment  Plan: Recommend treating for H. Pylori, patient has allergy to amoxicillin. We will start clarithromycin 500 mg twice a day, Flagyl to 50 mg 3 times a day, with pantoprazole twice a day for total of 14 days.  Biopsies from EGD from 12/14/2021 pending. Will advance to regular diet Continue ferrous sulfate 325 mg once a day Continue pantoprazole PPI twice daily on discharge, to be continued for 2 months, thereafter day once a day indefinitely.  [name], MD 12/16/2021, 2:34 PM

## 2021-12-16 NOTE — Evaluation (Signed)
Occupational Therapy Evaluation Patient Details Name: [name] MRN: [id number] DOB: 1933-05-03 Today's Date: 12/16/2021   History of Present Illness Pt is a 88 y.o. M who presents 12/13/2021 with fall and generalized weakness. Work up revealed symptomatic anemia with hemoglobin of 6.5. Pt found to have gastric ulcers causing anemia and melena. Significant PMH: CAD, severe aortic stenosis s/p TAV, HFpEF, moderate mitral regurgitation, type 2 diabetes, HTN, persistent af ib, PAD, CKD 3b, prior GIB, prostate CA.   Clinical Impression   PTA patient reports independent with ADLs, assist for IADLs from daughter and not driving.  He reports using RW recently due to weakness, endorses frequent falls.  Admitted for above and limited by impaired balance, generalized weakness, decreased activity tolerance.  He requires mod assist for toilet transfers, up to mod assist for LB Adls and setup for UB ADLs, mobility in room using RW with min assist for safety. Vitals as below, pt denies dizziness throughout session.  Will benefit from OT acutely to optimize independence, safety and reduce fall risk, with follow up at SNF level at dc.  IF pt declines SNF, recommend 24/7 support ([place] and aide).   Supine BP:98/61(73) EOB BP: 103/58 (74) Standing BP: 119/57 (76)  Post activity BP: 112/65 (81)  HR up to 120, SpO2 >90% on 2L     Recommendations for follow up therapy are one component of a multi-disciplinary discharge planning process, led by the attending physician.  Recommendations may be updated based on patient status, additional functional criteria and insurance authorization.   Follow Up Recommendations  Skilled nursing-short term rehab (<3 hours/day)    Assistance Recommended at Discharge Frequent or constant Supervision/Assistance  Patient can return home with the following A little help with walking and/or transfers;A lot of help with bathing/dressing/bathroom;Assist for transportation;Help with  stairs or ramp for entrance;Assistance with cooking/housework    Functional Status Assessment  Patient has had a recent decline in their functional status and demonstrates the ability to make significant improvements in function in a reasonable and predictable amount of time.  Equipment Recommendations  None recommended by OT    Recommendations for Other Services       Precautions / Restrictions Precautions Precautions: Fall Restrictions Weight Bearing Restrictions: No      Mobility Bed Mobility Overal bed mobility: Needs Assistance Bed Mobility: Supine to Sit Rolling: Min assist         General bed mobility comments: trunk support to ascend with [facility] elevated    Transfers                          Balance Overall balance assessment: Needs assistance Sitting-balance support: No upper extremity supported, Feet supported Sitting balance-Leahy Scale: Good     Standing balance support: No upper extremity supported, During functional activity Standing balance-Leahy Scale: Poor Standing balance comment: relies on BUE support of RW                           ADL either performed or assessed with clinical judgement   ADL Overall ADL's : Needs assistance/impaired     Grooming: Set up;Sitting           Upper Body Dressing : Set up;Sitting   Lower Body Dressing: Minimal assistance;Sit to/from stand   Toilet Transfer: Moderate assistance;Ambulation;BSC/3in1;Rolling walker (2 wheels) Toilet Transfer Details (indicate cue type and reason): mod assist to ascend from EOB but min assist from [facility]  Toileting- Clothing Manipulation and Hygiene: Moderate assistance Toileting - Clothing Manipulation Details (indicate cue type and reason): for hygiene, pt relies on BUE support     Functional mobility during ADLs: Minimal assistance;Rolling walker (2 wheels)       Vision         Perception     Praxis      Pertinent Vitals/Pain Pain  Assessment Pain Assessment: No/denies pain     Hand Dominance Right   Extremity/Trunk Assessment Upper Extremity Assessment Upper Extremity Assessment: RUE deficits/detail;LUE deficits/detail;Generalized weakness RUE Deficits / Details: ROM limited to 90 at shoulder LUE Deficits / Details: shoulder flexion AROM to 45* at  shoulder due to chronic arthritis   Lower Extremity Assessment Lower Extremity Assessment: Generalized weakness   Cervical / Trunk Assessment Cervical / Trunk Assessment: Kyphotic   Communication Communication Communication: No difficulties   Cognition Arousal/Alertness: Awake/alert Behavior During Therapy: WFL for tasks assessed/performed Overall Cognitive Status: Impaired/Different from baseline Area of Impairment: Safety/judgement                         Safety/Judgement: Decreased awareness of deficits     General Comments: improving awareness to deficits and need for increased assist     General Comments  VSS, BP monitiored and HR up to 120. Pt on 2L [place]    Exercises     Shoulder Instructions      Home Living Family/patient expects to be discharged to:: Private residence Living Arrangements: Alone Available Help at Discharge: Family;Available PRN/intermittently Type of Home: House Home Access: Stairs to enter [employer] of Steps: 2 Entrance Stairs-Rails: None Home Layout: One level     Bathroom Shower/Tub: Sponge bathes at baseline;Walk-in shower   Bathroom Toilet: Standard     Home Equipment: Conservation officer, nature (2 wheels);Cane - single point;BSC/3in1;Shower seat;Wheelchair - manual   Additional Comments: wife at [employer]      Prior Functioning/Environment Prior Level of Function : Needs assist             Mobility Comments: uses walker vs cane, sleeps in recliner ADLs Comments: does not drive, reports daughter assists with IADLs, has HHRN and PT        OT Problem List: Decreased strength;Decreased  activity tolerance;Impaired balance (sitting and/or standing);Decreased safety awareness;Decreased knowledge of use of DME or AE;Decreased knowledge of precautions;Impaired UE functional use      OT Treatment/Interventions: Self-care/ADL training;Therapeutic exercise;DME and/or AE instruction;Therapeutic activities;Balance training;Patient/family education    OT Goals(Current goals can be found in the care plan section) Acute Rehab OT Goals Patient Stated Goal: home OT Goal Formulation: With patient Time For Goal Achievement: 12/30/21 Potential to Achieve Goals: Good  OT Frequency: Min 2X/week    Co-evaluation              AM-PAC OT "6 Clicks" Daily Activity     Outcome Measure Help from another person eating meals?: None Help from another person taking care of personal grooming?: A Little Help from another person toileting, which includes using toliet, bedpan, or urinal?: A Lot Help from another person bathing (including washing, rinsing, drying)?: A Little Help from another person to put on and taking off regular upper body clothing?: A Little Help from another person to put on and taking off regular lower body clothing?: A Little 6 Click Score: 18   End of Session Equipment Utilized During Treatment: Gait belt;Rolling walker (2 wheels);Oxygen (2L) Nurse Communication: Mobility status  Activity Tolerance: Patient tolerated treatment  well Patient left: in chair;with call bell/phone within reach;with chair alarm set  OT Visit Diagnosis: Other abnormalities of gait and mobility (R26.89);Muscle weakness (generalized) (M62.81)                Time: 8377-9396 OT Time Calculation (min): 31 min Charges:  OT General Charges $OT Visit: 1 Visit OT Evaluation $OT Eval Moderate Complexity: 1 Mod OT Treatments $Self Care/Home Management : 8-22 mins  Jolaine Artist, OT Acute Rehabilitation Services Pager [phone] Office [phone] '  Delight Stare 12/16/2021, 8:59 AM

## 2021-12-16 NOTE — Progress Notes (Signed)
History and Physical  SIRE POET EXB:[id number] DOB: 04/29/1933 DOA: 12/13/2021  Referring physician: Dr. Lavone Orn, [place]  PCP: [name], MD  Outpatient Specialists: Cardiology Patient coming from: Home. Chief Complaint: Generalized weakness, fall last night.  HPI: [name] is a 88 y.o. male with medical history significant for coronary artery calcification, severe aortic stenosis status post TAVR, HFpEF, moderate mitral regurgitation, moderate tricuspid regurgitation, type 2 diabetes, essential hypertension, persistent atrial fibrillation not on oral anticoagulation, peripheral artery disease, CKD 3B, prior GI bleed with gastric ulcers who presented to [facility] ED with complaints of generalized weakness and a fall last night.  Patient denies any chest pain, dyspnea, or palpitations prior to his fall.  Denies tripping on any objects.  There has been no changes in his appetite.  He denies use of NSAIDs.  Endorses associated dark stools for the past 1 week.  He recalls hitting his head during the fall.  He was brought into the ED today from home for further evaluation and management of his symptomatology.  Work-up in the ED revealed symptomatic anemia with hemoglobin of 6.5K and positive FOBT.  1 unit of PRBCs was ordered to be transfused by EDP.  GI was also consulted by EDP.  TRH, hospitalist service, was asked to admit.  At the time of this evaluation, patient denies any abdominal pain except when palpated, tenderness is localized in the right upper quadrant.  Last bowel movement was on 12/12/2021, semiformed and dark.  Patient was started on IV Protonix 40 mg twice daily in the ED.  UA and COVID-19 screening tests all negative.  12/15/21: Seen and examined at his bedside.  Endorses shortness of breath with minimal exertion.  He denies any chest pain however admits to palpitations while in A-fib and RVR.  BP stable, home Toprol-XL restarted at lower dose 12.5 mg daily.  Reported dark and liquid  stools.  Hemoglobin is uptrending.  Diet advanced to full liquids from clear liquid by GI.  Appreciate GIs assistance.  12/16/21: He was seen at his bedside.  He has no new complaints this morning.  No abdominal pain, no nausea.   Past Medical History:  Diagnosis Date   Anemia    LOW PLATELETS OTHER DAY  PER PT   Anticoagulated on Coumadin 12/21/2012   Aortic stenosis    Arthritis    "left wrist; back sometimes" (01/21/2013)   Atrial fibrillation ([place])    [name], DR COOPER   Bradycardia    Exertional shortness of breath    Heart murmur    "I've had it for years; runs in the family on daddy's side" (01/21/2013)   HTN (hypertension)    Hyperlipemia 12/21/2012   Prostate cancer ([place])    "had 40 tx of radiation in 2009" (01/21/2013)   S/P aortic valve replacement with bioprosthetic valve 01/19/2013   Transcatheter Aortic Valve Replacement using 31mm Sapien bioprosthetic tissue valve via transapical approach   Type II diabetes mellitus ([place])    Past Surgical History:  Procedure Laterality Date   BALLOON DILATION N/A 08/02/2020   Procedure: BALLOON DILATION;  Surgeon: [name], MD;  Location: [facility];  Service: Gastroenterology;  Laterality: N/A;   BIOPSY  10/03/2021   Procedure: BIOPSY;  Surgeon: [name], MD;  Location: [facility];  Service: Endoscopy;;   CARDIAC CATHETERIZATION  12/16/2012   CARDIAC VALVE REPLACEMENT  01/19/2013   AVR   CATARACT EXTRACTION W/ INTRAOCULAR LENS  IMPLANT, BILATERAL     ESOPHAGOGASTRODUODENOSCOPY N/A 10/03/2021  Procedure: ESOPHAGOGASTRODUODENOSCOPY (EGD);  Surgeon: [name], MD;  Location: [facility];  Service: Endoscopy;  Laterality: N/A;   ESOPHAGOGASTRODUODENOSCOPY (EGD) WITH PROPOFOL N/A 08/02/2020   Procedure: ESOPHAGOGASTRODUODENOSCOPY (EGD) WITH PROPOFOL;  Surgeon: [name], MD;  Location: [facility];  Service: Gastroenterology;  Laterality: N/A;   EYE SURGERY     INTRAOPERATIVE TRANSESOPHAGEAL ECHOCARDIOGRAM N/A 01/19/2013    Procedure: INTRAOPERATIVE TRANSESOPHAGEAL ECHOCARDIOGRAM;  Surgeon: [name], MD;  Location: [facility];  Service: Open Heart Surgery;  Laterality: N/A;   ORIF FOREARM FRACTURE Left 1954   "compound fx" (01/21/2013)   TRANSTHORACIC ECHOCARDIOGRAM  09/04/10, 09/07/08    Social History:  reports that he quit smoking about 45 years ago. His smoking use included cigarettes. He has a 44.00 pack-year smoking history. He has never used smokeless tobacco. He reports that he does not drink alcohol and does not use drugs.   Allergies  Allergen Reactions   Amoxicillin Other (See Comments)    Burning of mouth, tongue and lips.  itchy, burning throat    Family History  Problem Relation Age of Onset   Diabetes Mother    Melanoma Mother 51   Bone cancer Mother 36   Pancreatic cancer Father 59   Heart disease Father    Stroke Sister    Cancer Brother 3       ? brain cancer      Prior to Admission medications   Medication Sig Start Date End Date Taking? Authorizing Provider  ACCU-CHEK AVIVA PLUS test strip  05/23/18   [provider]  ACCU-CHEK SOFTCLIX LANCETS lancets daily. use as directed 04/07/18   [provider]  Acetaminophen (TYLENOL ARTHRITIS PAIN PO) Take 650 mg by mouth daily with breakfast.    [provider]  Amino Acids-Protein Hydrolys (FEEDING SUPPLEMENT, PRO-STAT SUGAR FREE 64,) LIQD Take 30 mLs by mouth 2 (two) times daily. Patient not taking: Reported on 11/08/2021    [provider]  aspirin EC 81 MG tablet Take 1 tablet (81 mg total) by mouth daily. Swallow whole. 11/19/21   [name], MD  B Complex Vitamins (B COMPLEX 50) TABS Take 1 tablet by mouth daily with breakfast.    [provider]  bisacodyl (DULCOLAX) 10 MG suppository Place rectally as needed for moderate constipation. If not relieved by MOM, give 10 mg Bisacodyl suppositiory in 24 hours as needed Patient not taking: Reported on 11/08/2021    [provider]  carvedilol (COREG) 12.5 MG tablet Take 12.5 mg by mouth 2 (two) times daily. 11/08/21   [provider]  cholecalciferol (VITAMIN D3) 25 MCG (1000 UNIT) tablet Take 1,000 Units by mouth 2 (two) times daily.    [provider]  dapagliflozin propanediol (FARXIGA) 10 MG TABS tablet Take 1 tablet (10 mg total) by mouth daily before breakfast. 10/23/21   Medina-Vargas, Monina C, NP  doxazosin (CARDURA) 8 MG tablet Take 1 tablet (8 mg total) by mouth daily. 10/23/21   Medina-Vargas, Monina C, NP  enzalutamide (XTANDI) 40 MG capsule Take 4 capsules (160 mg total) by mouth daily at 2 PM. 10/23/21   Medina-Vargas, Monina C, NP  ferrous sulfate 325 (65 FE) MG tablet Take 325 mg by mouth See admin instructions. Take one tablet (325 mg) by mouth twice daily - with lunch and supper    [provider]  gabapentin (NEURONTIN) 100 MG capsule Take 1 capsule (100 mg total) by mouth at bedtime. 10/23/21   Medina-Vargas, Monina C, NP  lisinopril (  ZESTRIL) 20 MG tablet Take 1 tablet (20 mg total) by mouth daily. 10/23/21   Medina-Vargas, Monina C, NP  magnesium hydroxide (MILK OF MAGNESIA) 400 MG/5ML suspension Take by mouth daily as needed for mild constipation. If no BM in 3 days, give 30 cc Milk of Magnesium in 24 hours as needed Patient not taking: Reported on 11/08/2021    [provider]  metFORMIN (GLUCOPHAGE) 1000 MG tablet Take 1 tablet (1,000 mg total) by mouth 2 (two) times daily with breakfast and lunch. 10/23/21   Medina-Vargas, Monina C, NP  metoprolol succinate (TOPROL-XL) 50 MG 24 hr tablet Take 1 tablet (50 mg total) by mouth daily. Take with or immediately following a meal. 10/23/21   Medina-Vargas, Monina C, NP  pantoprazole (PROTONIX) 40 MG tablet Take 1 tablet (40 mg total) by mouth daily. 10/23/21   Medina-Vargas, Monina C, NP  rosuvastatin (CRESTOR) 5 MG tablet TAKE 1 TABLET BY MOUTH ONCE DAILY AT  6PM 10/23/21   Medina-Vargas, Monina C, NP  Selenium  200 MCG CAPS Take 200 mcg by mouth daily with breakfast.    [provider]  simethicone (MYLICON) 80 MG chewable tablet Chew 1 tablet (80 mg total) by mouth 4 (four) times daily as needed for flatulence. Patient not taking: Reported on 11/08/2021 10/08/21   Lavina Hamman, MD  Sodium Phosphates (RA SALINE ENEMA RE) If not relieved by Biscodyl suppository, give disposable Saline Enema rectally x1 dose 24 hrs as needed Patient not taking: Reported on 11/08/2021    [provider]  torsemide (DEMADEX) 20 MG tablet Take 2 tablets (40 mg total) by mouth 2 (two) times daily. 11/23/21 02/21/22  [name], MD  verapamil (CALAN-SR) 180 MG CR tablet Take 180 mg by mouth daily. 11/08/21   [provider]  Vitamin A 2400 MCG (8000 UT) TABS Take 2,400 mcg by mouth daily with breakfast.    [provider]  vitamin B-12 (CYANOCOBALAMIN) 1000 MCG tablet Take 1,000 mcg by mouth daily.    [provider]  vitamin C (ASCORBIC ACID) 500 MG tablet Take 500 mg by mouth 2 (two) times daily.    [provider]  zinc gluconate 50 MG tablet Take 50 mg by mouth daily with breakfast.    [provider]    Physical Exam: BP (!) 108/53    Pulse 100    Temp 98.5 F (36.9 C) (Oral)    Resp 19    SpO2 97%   General: 88 y.o. year-old male frail-appearing in no acute distress.  He is alert and oriented x3.   Cardiovascular: Irregular rate and rhythm no rubs or gallops. Respiratory: Clear to auscultation no wheezes or rales.   Abdomen: Soft nontender bowel sounds present. Muskuloskeletal: Trace lower extremity edema bilaterally. Neuro: Nonfocal exam.  Moves all 4 extremities. Skin: Bruising involving upper and lower extremities bilaterally. Psychiatry: Mood is appropriate for condition and setting.         Labs on Admission:  Basic Metabolic Panel: Recent Labs  Lab 12/13/21 0726 12/14/21 0545 12/15/21 0548 12/16/21 0102  NA 139 141 141 137  K 4.8  4.2 4.2 3.7  CL 101 106 105 106  CO2 24 25 20* 23  GLUCOSE 129* 117* 88 178*  BUN 82* 65* 50* 44*  CREATININE 1.98* 1.56* 1.44* 1.24  CALCIUM 9.0 8.8* 8.7* 8.4*  MG  --  2.1  --   --   PHOS  --  5.0*  --   --  Liver Function Tests: Recent Labs  Lab 12/13/21 0726 12/14/21 0545 12/15/21 0548  AST 30 29 29   ALT 17 17 20   ALKPHOS 58 58 67  BILITOT 0.7 1.4* 1.7*  PROT 5.7* 5.3* 5.5*  ALBUMIN 3.1* 2.8* 2.8*   No results for input(s): LIPASE, AMYLASE in the last 168 hours. No results for input(s): AMMONIA in the last 168 hours. CBC: Recent Labs  Lab 12/13/21 0726 12/13/21 2029 12/14/21 0545 12/15/21 0548 12/16/21 0102  WBC 4.8 4.6 5.5 7.9 7.3  NEUTROABS 3.2  --   --   --   --   HGB 6.5* 7.5* 8.7* 9.2* 8.7*  HCT 20.9* 22.7* 26.6* 29.5* 26.9*  MCV 99.1 97.4 95.7 100.0 97.5  PLT 208 197 204 215 181   Cardiac Enzymes: No results for input(s): CKTOTAL, CKMB, CKMBINDEX, TROPONINI in the last 168 hours.  BNP (last 3 results) Recent Labs    12/13/21 1455  BNP 768.6*    ProBNP (last 3 results) Recent Labs    12/05/21 0915  PROBNP 6,343*    CBG: Recent Labs  Lab 12/15/21 1112 12/15/21 1618 12/15/21 2107 12/16/21 0606 12/16/21 1126  GLUCAP 183* 235* 186* 153* 173*    Radiological Exams on Admission: No results found.  EKG: I independently viewed the EKG done and my findings are as followed: Atrial fibrillation rate of 87 with PVCs.  Assessment/Plan Present on Admission: **None**  Principal Problem:   Weakness  Generalized weakness, multifactorial secondary to symptomatic anemia, AKI, acute illness. Treat underlying conditions. Presented with hemoglobin of 6.5 and positive FOBT, 1 unit PRBC ordered to be transfused in the ED. Repeat CBC posttransfusion Continue IV Protonix 40 mg twice daily started in the ED. Hold off any anticoagulation or antiplatelets for now. GI following PT OT assessment recommending SNF. TOC assisting with SNF  placement.  Permanent A-fib with RVR Not oral anticoagulation prior to admission Restarted home Toprol-XL at lower dose 12.5 mg daily to avoid hypotension. Continue to closely monitor on telemetry.  Acute blood loss anemia with suspected upper GI bleed with history of gastric ulcers seen on EGD done on 10/03/2021 by Dr. Watt Climes. Maintain hemoglobin above 8 due to underlying cardiac history. Hemoglobin dropped this morning 8.7 from 9.2 from 8.7 from 7.5, post 1 unit PRBC transfusion. Post EGD by Dr. Michail Sermon, it revealed oozing gastric ulcers with pigmented material. Biopsied. Acute gastritis. Biopsied.  GI recommended to continue IV Protonix 40 mg a day. Continue to closely monitor H&H  Bleeding gastric ulcers/acute gastritis seen on EGD done on 12/14/2021 by Dr. Michail Sermon, GI. Continue IV Protonix 40 mg twice daily as recommended by GI Closely monitor H&H. Continue to maintain MAP greater than 65.  Resolving AKI on CKD 3B Baseline creatinine appears to be 1.4 with GFR of 47. Presented with creatinine of 1.98 with GFR of 32 Renal function is back to baseline. Continue to avoid nephrotoxic agents, and hypotension. Continue to monitor urine output with strict I's and O's Repeat renal panel in the morning.  Severe iron deficiency anemia Last iron studies done on 10/04/2021 showed severe iron deficiency Hold off oral iron replacement for now due to possible EGD. Resume oral iron supplement when okay with GI. We will transfuse IV iron Feraheme on 12/15/2021.  Chronic diastolic CHF Last 2D echo done on 11/11/2021 revealed LVEF 60 to 65% with no regional wall motion abnormalities.  Left atrial size and right atrial size was severely dilated.  There was severely elevated pulmonary artery systolic pressure. Continue  strict I's and O's and daily weight. Hold off home ACE inhibitor due to soft BPs to avoid hypotension in the setting of GI bleed. Toprol-XL was restarted on 12/15/2021 due to A-fib with  RVR.  BPH Continue to hold off home Cardura for now to avoid hypotension.  Hyperlipidemia Continue to hold off Crestor for now.  Type 2 diabetes with hyperglycemia Hemoglobin A1c 5.4 on 12/15/2021. Avoid hypoglycemia.  Recurrent fall Per ED staff, family reports frequent falls at home PT OT assessment on 12/14/2021, recommending SNF. Orthostatic vital signs on 12/14/2021. Continue fall precautions.          DVT prophylaxis: SCDs  Code Status: Full code  Family Communication: None at bedside  Disposition Plan: Likely will discharge to home with home health services once GI signs off.  Currently being monitored in the setting of recent GI bleed with oozing gastric ulcer seen on EGD.  Consults called: GI consulted by EDP.  Admission status: Inpatient status.   Status is: Inpatient  Patient requires at least 2 midnights for further evaluation and treatment of present condition.     Kayleen Memos MD [facility] Pager [phone]  If 7PM-7AM, please contact night-coverage [email or web address] Password [facility]  12/16/2021, 3:19 PM

## 2021-12-16 NOTE — [place] FL2 (Signed)
[place] MEDICAID FL2 LEVEL OF CARE SCREENING TOOL     IDENTIFICATION  Patient Name: [name] Birthdate: Nov 16, 1932 Sex: male Admission Date (Current Location): 12/13/2021  [facility] and [place] Number:  Herbalist and Address:  [facility], [place] [address] [place], [place] [address]      Provider Number: [id number]  Attending Physician Name and Address:  Kayleen Memos, DO  Relative Name and Phone Number:  Algenis Ballin, spouse, [phone]    Current Level of Care: Hospital Recommended Level of Care: [place] Prior Approval Number:    Date Approved/Denied:   PASRR Number: [id number]  Discharge Plan: SNF    Current Diagnoses: Patient Active Problem List   Diagnosis Date Noted   Weakness 12/13/2021   Unspecified protein-calorie malnutrition ([place]) 10/11/2021   Abdominal bloating 10/07/2021   Physical deconditioning 10/05/2021   Pressure injury of skin 10/03/2021   Dizziness 10/03/2021   Coagulopathy ([place]) 10/02/2021   Acute GI bleeding 10/01/2021   Hardening of the aorta (main artery of the heart) ([place]) 09/24/2021   Essential hypertension 09/24/2021   Neuropathy 06/15/2021   Recurrent falls 06/15/2021   Diabetic neuropathy ([place]) 03/09/2021   Diabetic retinopathy ([place]) 03/09/2021   Acute blood loss anemia, symptomatic secondary to GI bleed 03/09/2021   Other thrombophilia ([place]) 03/09/2021   Prostate cancer ([place]) 03/09/2021   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/09/2021   Nonrheumatic mitral valve regurgitation 09/11/2020   Nonrheumatic tricuspid valve regurgitation 09/11/2020   PAD (peripheral artery disease) ([place]) 08/22/2017   Absolute anemia 04/02/2016   Diabetes mellitus with coincident hypertension ([place]) 04/02/2016   Long term current use of anticoagulant 04/02/2016   Macrocytosis 04/02/2016   Arthritis, degenerative 04/02/2016   History of colon polyps  04/02/2016   Carcinoma of prostate ([place]) 04/02/2016   Abdominal discomfort, epigastric 12/20/2015   H/O adenomatous polyp of colon 12/20/2015   Left upper quadrant pain 03/16/2015   Chronic heart failure with preserved ejection fraction ([place]) 08/30/2014   S/P aortic valve replacement with bioprosthetic valve 01/19/2013   Hyperlipemia 12/21/2012   H/O prostate cancer 12/21/2012   Anticoagulated on Coumadin 12/21/2012   HYPERTENSION, CONTROLLED 09/06/2009   Chronic atrial fibrillation with RVR ([place]) 09/06/2009    Orientation RESPIRATION BLADDER Height & Weight     Self, Time, Place, Situation  O2 (nasual cannula 2L/M) Incontinent Weight:   Height:     BEHAVIORAL SYMPTOMS/MOOD NEUROLOGICAL BOWEL NUTRITION STATUS      Incontinent Diet (see DC summary, fluid thin)  AMBULATORY STATUS COMMUNICATION OF NEEDS Skin   Limited Assist Verbally Normal                       Personal Care Assistance Level of Assistance  Bathing, Feeding, Dressing Bathing Assistance: Limited assistance Feeding assistance: Independent Dressing Assistance: Limited assistance     Functional Limitations Info             SPECIAL CARE FACTORS FREQUENCY  PT (By licensed PT), OT (By licensed OT)     PT Frequency: 5x weekly OT Frequency: 5x weekly            Contractures Contractures Info: Not present    Additional Factors Info  Allergies, Code Status Code Status Info: full Allergies Info: amoxicillin           Current Medications (12/16/2021):  This is the current hospital active medication list Current Facility-Administered Medications  Medication  Dose Route Frequency Provider Last Rate Last Admin   acetaminophen (TYLENOL) tablet 650 mg  650 mg Oral Q6H PRN Wilford Corner, MD       cholecalciferol (VITAMIN D3) tablet 1,000 Units  1,000 Units Oral Daily Wilford Corner, MD   1,000 Units at 12/16/21 0919   ferrous sulfate tablet 325 mg  325 mg Oral Q breakfast Kayleen Memos, DO   325  mg at 12/16/21 0600   gabapentin (NEURONTIN) capsule 100 mg  100 mg Oral QHS Wilford Corner, MD   100 mg at 12/15/21 2156   insulin aspart (novoLOG) injection 0-5 Units  0-5 Units Subcutaneous QHS Hall, Carole N, DO       insulin aspart (novoLOG) injection 0-9 Units  0-9 Units Subcutaneous TID WC Irene Pap N, DO   2 Units at 12/16/21 0919   melatonin tablet 3 mg  3 mg Oral QHS PRN Wilford Corner, MD       metoprolol succinate (TOPROL-XL) 24 hr tablet 12.5 mg  12.5 mg Oral Daily Irene Pap N, DO   12.5 mg at 12/16/21 0919   metoprolol tartrate (LOPRESSOR) injection 2.5 mg  2.5 mg Intravenous Q6H PRN Irene Pap N, DO       ondansetron (ZOFRAN) injection 4 mg  4 mg Intravenous Q6H PRN Wilford Corner, MD       pantoprazole (PROTONIX) injection 40 mg  40 mg Intravenous Q12H Wilford Corner, MD   40 mg at 12/16/21 1761   vitamin B-12 (CYANOCOBALAMIN) tablet 1,000 mcg  1,000 mcg Oral Daily Wilford Corner, MD   1,000 mcg at 12/16/21 6073     Discharge Medications: Please see discharge summary for a list of discharge medications.  Relevant Imaging Results:  Relevant Lab Results:   Additional Information [phone], COVID J[place] 02/13/20 and 09/19/20  Alfredia Ferguson, LCSW

## 2021-12-16 NOTE — Progress Notes (Signed)
Mobility Specialist Progress Note:   12/16/21 1020  Mobility  Activity Ambulated with assistance in hallway  Level of Assistance Minimal assist, patient does 75% or more  Assistive Device Front wheel walker  Distance Ambulated (ft) 150 ft  Activity Response Tolerated well  $Mobility charge 1 Mobility   Pt eager for hallway ambulation this am. Requiring min guard throughout session, minA for x1 LOB while turning. Session performed on 2LO2, pt SOB with distance. SpO2 92% at end of session. Pt back in chair with all needs met, chair alarm on.   Nelta Numbers Mobility Specialist  Phone: [phone]

## 2021-12-17 LAB — BASIC METABOLIC PANEL
Anion gap: 8 (ref 5–15)
BUN: 39 mg/dL — ABNORMAL HIGH (ref 8–23)
CO2: 21 mmol/L — ABNORMAL LOW (ref 22–32)
Calcium: 8.3 mg/dL — ABNORMAL LOW (ref 8.9–10.3)
Chloride: 104 mmol/L (ref 98–111)
Creatinine, Ser: 1.22 mg/dL (ref 0.61–1.24)
GFR, Estimated: 57 mL/min — ABNORMAL LOW (ref >60)
Glucose, Bld: 191 mg/dL — ABNORMAL HIGH (ref 70–99)
Potassium: 3.9 mmol/L (ref 3.5–5.1)
Sodium: 133 mmol/L — ABNORMAL LOW (ref 135–145)

## 2021-12-17 LAB — GLUCOSE, CAPILLARY
Glucose-Capillary: 165 mg/dL — ABNORMAL HIGH (ref 70–99)
Glucose-Capillary: 155 mg/dL — ABNORMAL HIGH (ref 70–99)
Glucose-Capillary: 165 mg/dL — ABNORMAL HIGH (ref 70–99)
Glucose-Capillary: 182 mg/dL — ABNORMAL HIGH (ref 70–99)

## 2021-12-17 LAB — CBC
HCT: 26.1 % — ABNORMAL LOW (ref 39.0–52.0)
Hemoglobin: 8.4 g/dL — ABNORMAL LOW (ref 13.0–17.0)
MCH: 32.1 pg (ref 26.0–34.0)
MCHC: 32.2 g/dL (ref 30.0–36.0)
MCV: 99.6 fL (ref 80.0–100.0)
Platelets: 187 10*3/uL (ref 150–400)
RBC: 2.62 MIL/uL — ABNORMAL LOW (ref 4.22–5.81)
RDW: 16.9 % — ABNORMAL HIGH (ref 11.5–15.5)
WBC: 6.3 10*3/uL (ref 4.0–10.5)
nRBC: 0.3 % — ABNORMAL HIGH (ref 0.0–0.2)

## 2021-12-17 MED ORDER — PANTOPRAZOLE SODIUM 40 MG PO TBEC
40.0000 mg | DELAYED_RELEASE_TABLET | Freq: Two times a day (BID) | ORAL | Status: DC
  Administered 2021-12-17 – 2021-12-18 (×2): 40 mg via ORAL
  Filled 2021-12-17 (×2): qty 1

## 2021-12-17 NOTE — Progress Notes (Signed)
Mobility Specialist Criteria Algorithm Info.   12/17/21 1030  Mobility  Activity Ambulated with assistance in hallway (in chair before and after ambulation)  Range of Motion/Exercises Active;All extremities  Level of Assistance Minimal assist, patient does 75% or more (LOB x1)  Assistive Device Front wheel walker  Distance Ambulated (ft) 120 ft  Activity Response Tolerated well   Patient received in recliner chair eager to participate in mobility. Was min guard sit>stand and min A to ambulate in hallway due to intermittent unsteadiness. Oxygen saturated low 90's throughout on 3LO2. As distance and fatigue increased pt req'd mod cues to stay proximal to RW. Returned to room without incident or complaint. Was left in recliner chair with all needs met, call bell in reach.    12/17/2021 11:17 AM  [name], [place], [place]  MLYYT:[phone] Office: [phone]

## 2021-12-17 NOTE — Progress Notes (Signed)
[facility] Gastroenterology Progress Note  [name] 88 y.o. [birth year]  CC: Gastric ulcers   Subjective: Patient seen and examined at bedside.  Continues to have weakness.  Denies any GI symptoms.  Denies nausea or vomiting  ROS : Afebrile.  Negative for acute chest pain   Objective: Vital signs in last 24 hours: Vitals:   12/16/21 2248 12/17/21 0400  BP: 102/60 (!) 104/51  Pulse: 87 84  Resp: 16 14  Temp: (!) 97.5 F (36.4 C) (!) 97.5 F (36.4 C)  SpO2: 94% 96%    Physical Exam:  General:  Elderly patient, not in acute distress  Head:  Normocephalic, without obvious abnormality, atraumatic  Eyes:  , EOM's intact,   Lungs:   No visible respiratory distress  Heart:  Regular rate and rhythm, S1, S2 normal  Abdomen:   Soft, non-tender, nondistended, bowel sounds present, no peritoneal signs  Neuro: Alert and oriented times  Psych Mood and affect normal    Lab Results: Recent Labs    12/16/21 0102 12/17/21 0043  NA 137 133*  K 3.7 3.9  CL 106 104  CO2 23 21*  GLUCOSE 178* 191*  BUN 44* 39*  CREATININE 1.24 1.22  CALCIUM 8.4* 8.3*   Recent Labs    12/15/21 0548  AST 29  ALT 20  ALKPHOS 67  BILITOT 1.7*  PROT 5.5*  ALBUMIN 2.8*   Recent Labs    12/16/21 0102 12/17/21 0043  WBC 7.3 6.3  HGB 8.7* 8.4*  HCT 26.9* 26.1*  MCV 97.5 99.6  PLT 181 187   No results for input(s): LABPROT, INR in the last 72 hours.    Assessment/Plan: -Anemia, melena.  EGD 2/10 showing multiple gastric ulcers.  History of H. pylori without any documented treatment in the past  Recommendation ------------------------ -No acute GI issues at this time.  No further bleeding episodes.  He has been started on treatment for H. Pylori with clarithromycin, Flagyl and twice daily PPI. -No further inpatient GI work-up planned at this time.  Biopsy still pending. -Continue twice daily PPI for 2 months followed by once a day PPI indefinitely -GI will sign off.  Call us back if  needed.    Otis Brace MD, [place] 12/17/2021, 9:06 AM  Contact #  [phone]

## 2021-12-17 NOTE — Anesthesia Postprocedure Evaluation (Signed)
Anesthesia Post Note  Patient: [name]  Procedure(s) Performed: ESOPHAGOGASTRODUODENOSCOPY (EGD) BIOPSY     Patient location during evaluation: Endoscopy Anesthesia Type: MAC Level of consciousness: awake and alert Pain management: pain level controlled Vital Signs Assessment: post-procedure vital signs reviewed and stable Respiratory status: spontaneous breathing, nonlabored ventilation, respiratory function stable and patient connected to nasal cannula oxygen Cardiovascular status: stable and blood pressure returned to baseline Postop Assessment: no apparent nausea or vomiting Anesthetic complications: no   No notable events documented.  Last Vitals:  Vitals:   12/17/21 0949 12/17/21 1125  BP: 108/62 100/62  Pulse: 94 100  Resp:  19  Temp:  36.6 C  SpO2:  98%    Last Pain:  Vitals:   12/17/21 1125  TempSrc: Oral  PainSc:                  Jalan Bodi

## 2021-12-17 NOTE — Progress Notes (Signed)
Occupational Therapy Treatment Patient Details Name: [name] MRN: [id number] DOB: Dec 05, 1932 Today's Date: 12/17/2021   History of present illness Pt is a 88 y.o. M who presents 12/13/2021 with fall and generalized weakness. Work up revealed symptomatic anemia with hemoglobin of 6.5. Pt found to have gastric ulcers causing anemia and melena. Significant PMH: CAD, severe aortic stenosis s/p TAV, HFpEF, moderate mitral regurgitation, type 2 diabetes, HTN, persistent af ib, PAD, CKD 3b, prior GIB, prostate CA.   OT comments  Pt eager for OOB and OT participation.  Pt reports agreeable to SNF rehab.  Completing transfers and mobility with min assist using RW, cueing for safety and rw mgmt.  Pt scoring 8/28 on short blessed test (questionable impairment) with deficits in attention, sequencing and recall.  Will follow acutely.    Recommendations for follow up therapy are one component of a multi-disciplinary discharge planning process, led by the attending physician.  Recommendations may be updated based on patient status, additional functional criteria and insurance authorization.    Follow Up Recommendations  Skilled nursing-short term rehab (<3 hours/day)    Assistance Recommended at Discharge Frequent or constant Supervision/Assistance  Patient can return home with the following  A little help with walking and/or transfers;A lot of help with bathing/dressing/bathroom;Assist for transportation;Help with stairs or ramp for entrance;Assistance with cooking/housework;Direct supervision/assist for medications management;Direct supervision/assist for financial management   Equipment Recommendations  None recommended by OT    Recommendations for Other Services      Precautions / Restrictions Precautions Precautions: Fall Restrictions Weight Bearing Restrictions: No       Mobility Bed Mobility Overal bed mobility: Needs Assistance Bed Mobility: Supine to Sit Rolling: Min assist          General bed mobility comments: trunk support    Transfers                         Balance Overall balance assessment: Needs assistance Sitting-balance support: No upper extremity supported, Feet supported Sitting balance-Leahy Scale: Good     Standing balance support: No upper extremity supported, During functional activity, Bilateral upper extremity supported Standing balance-Leahy Scale: Poor Standing balance comment: relies on RW dynamically                           ADL either performed or assessed with clinical judgement   ADL Overall ADL's : Needs assistance/impaired     Grooming: Min guard;Wash/dry face;Standing Grooming Details (indicate cue type and reason): min guard for safety, mild posterior lean but correct with min cueing             Lower Body Dressing: Minimal assistance;Sit to/from stand   Toilet Transfer: Ambulation;Minimal assistance;Rolling walker (2 wheels) Toilet Transfer Details (indicate cue type and reason): for safety/balance, cueing for hand placement using RW         Functional mobility during ADLs: Minimal assistance;Rolling walker (2 wheels);Cueing for safety General ADL Comments: pt on RA upon entry with VSS, poor pleuth with activity with SpO2 dropping with activity.  SOB with mobility from sink to chair and 2L donned.    Extremity/Trunk Assessment              Vision       Perception     Praxis      Cognition Arousal/Alertness: Awake/alert Behavior During Therapy: WFL for tasks assessed/performed Overall Cognitive Status: Impaired/Different from baseline Area of Impairment: Memory, Safety/judgement,  Problem solving, Attention                   Current Attention Level: Selective Memory: Decreased short-term memory, Decreased recall of precautions   Safety/Judgement: Decreased awareness of safety, Decreased awareness of deficits   Problem Solving: Difficulty sequencing, Slow  processing, Requires verbal cues General Comments: Short blessed test reveals defcitis in attention, sequencing and recall- scoring 8/28 questionable impairment.  improving awareness and agreeable to SNF now.        Exercises      Shoulder Instructions       General Comments VSS on 2L    Pertinent Vitals/ Pain       Pain Assessment Pain Assessment: No/denies pain  Home Living                                          Prior Functioning/Environment              Frequency  Min 2X/week        Progress Toward Goals  OT Goals(current goals can now be found in the care plan section)  Progress towards OT goals: Progressing toward goals  Acute Rehab OT Goals Patient Stated Goal: get stronger before going home OT Goal Formulation: With patient  Plan Discharge plan remains appropriate;Frequency remains appropriate    Co-evaluation                 AM-PAC OT "6 Clicks" Daily Activity     Outcome Measure   Help from another person eating meals?: None Help from another person taking care of personal grooming?: A Little Help from another person toileting, which includes using toliet, bedpan, or urinal?: A Lot Help from another person bathing (including washing, rinsing, drying)?: A Little Help from another person to put on and taking off regular upper body clothing?: A Little Help from another person to put on and taking off regular lower body clothing?: A Little 6 Click Score: 18    End of Session Equipment Utilized During Treatment: Gait belt;Rolling walker (2 wheels);Oxygen  OT Visit Diagnosis: Other abnormalities of gait and mobility (R26.89);Muscle weakness (generalized) (M62.81)   Activity Tolerance Patient tolerated treatment well   Patient Left in chair;with call bell/phone within reach;with chair alarm set   Nurse Communication Mobility status        Time: 9381-8299 OT Time Calculation (min): 25 min  Charges: OT General  Charges $OT Visit: 1 Visit OT Treatments $Self Care/Home Management : 23-37 mins  [place] Pager [phone] Office [phone]   [name] 12/17/2021, 10:20 AM

## 2021-12-17 NOTE — Care Management Important Message (Signed)
Important Message  Patient Details  Name: [name] MRN: [id number] Date of Birth: 13-Apr-1933   Medicare Important Message Given:  Yes     Orbie Pyo 12/17/2021, 3:32 PM

## 2021-12-17 NOTE — Progress Notes (Signed)
Physical Therapy Treatment Patient Details Name: [name] MRN: [id number] DOB: July 21, 1933 Today's Date: 12/17/2021   History of Present Illness Pt is a 88 y.o. M who presents 12/13/2021 with fall and generalized weakness. Work up revealed symptomatic anemia with hemoglobin of 6.5. Pt found to have gastric ulcers causing anemia and melena. Significant PMH: CAD, severe aortic stenosis s/p TAV, HFpEF, moderate mitral regurgitation, type 2 diabetes, HTN, persistent af ib, PAD, CKD 3b, prior GIB, prostate CA.    PT Comments    Pt progressing towards his physical therapy goals; remains motivated to participate. Pt able to participate in seated and standing therapeutic exercises and ambulating 50 feet with a walker at a min assist level. Presents as a high fall risk based on decreased gait speed and history of falls. Continue to recommend SNF for ongoing Physical Therapy.     Recommendations for follow up therapy are one component of a multi-disciplinary discharge planning process, led by the attending physician.  Recommendations may be updated based on patient status, additional functional criteria and insurance authorization.  Follow Up Recommendations  Skilled nursing-short term rehab (<3 hours/day)     Assistance Recommended at Discharge Frequent or constant Supervision/Assistance  Patient can return home with the following A little help with walking and/or transfers;A little help with bathing/dressing/bathroom;Assistance with cooking/housework;Help with stairs or ramp for entrance   Equipment Recommendations  None recommended by PT    Recommendations for Other Services       Precautions / Restrictions Precautions Precautions: Fall Restrictions Weight Bearing Restrictions: No     Mobility  Bed Mobility Overal bed mobility: Needs Assistance Bed Mobility: Sit to Supine       Sit to supine: Supervision        Transfers Overall transfer level: Needs assistance Equipment  used: Rolling walker (2 wheels) Transfers: Sit to/from Stand Sit to Stand: Min assist           General transfer comment: MinA to rise and steady    Ambulation/Gait Ambulation/Gait assistance: Min assist Gait Distance (Feet): 50 Feet Assistive device: Rolling walker (2 wheels) Gait Pattern/deviations: Step-through pattern, Decreased stride length, Trunk flexed, Knee flexed in stance - right Gait velocity: decreased Gait velocity interpretation: <1.8 ft/sec, indicate of risk for recurrent falls   General Gait Details: Mod multimodal cues for trunk flexion, pt with noted increased right knee flexion in midstance and decreased pelvic rotation. cues for segmental turning   Stairs             Wheelchair Mobility    Modified Rankin (Stroke Patients Only)       Balance Overall balance assessment: Needs assistance Sitting-balance support: No upper extremity supported, Feet supported Sitting balance-Leahy Scale: Good     Standing balance support: During functional activity, Bilateral upper extremity supported Standing balance-Leahy Scale: Poor                              Cognition Arousal/Alertness: Awake/alert Behavior During Therapy: WFL for tasks assessed/performed Overall Cognitive Status: Impaired/Different from baseline Area of Impairment: Memory, Safety/judgement, Problem solving, Attention                   Current Attention Level: Selective Memory: Decreased short-term memory, Decreased recall of precautions   Safety/Judgement: Decreased awareness of safety, Decreased awareness of deficits   Problem Solving: Difficulty sequencing, Slow processing, Requires verbal cues          Exercises General  Exercises - Lower Extremity Long Arc Quad: Both, 15 reps, Seated Hip ABduction/ADduction: Both, 20 reps, Seated Hip Flexion/Marching: Both, 10 reps, Seated Other Exercises Other Exercises: Standing hip extension (hands on walker): x 10  bilaterally    General Comments  SpO2 96% on RA, HR 94      Pertinent Vitals/Pain Pain Assessment Pain Assessment: No/denies pain    Home Living                          Prior Function            PT Goals (current goals can now be found in the care plan section) Acute Rehab PT Goals Patient Stated Goal: get stronger Potential to Achieve Goals: Good Progress towards PT goals: Progressing toward goals    Frequency    Min 3X/week      PT Plan Current plan remains appropriate    Co-evaluation              AM-PAC PT "6 Clicks" Mobility   Outcome Measure  Help needed turning from your back to your side while in a flat bed without using bedrails?: A Little Help needed moving from lying on your back to sitting on the side of a flat bed without using bedrails?: A Little Help needed moving to and from a bed to a chair (including a wheelchair)?: A Little Help needed standing up from a chair using your arms (e.g., wheelchair or bedside chair)?: A Little Help needed to walk in hospital room?: A Little Help needed climbing 3-5 steps with a railing? : Total 6 Click Score: 16    End of Session Equipment Utilized During Treatment: Gait belt Activity Tolerance: Patient tolerated treatment well Patient left: in bed;with call bell/phone within reach;with bed alarm set Nurse Communication: Mobility status PT Visit Diagnosis: Unsteadiness on feet (R26.81);Muscle weakness (generalized) (M62.81);History of falling (Z91.81);Difficulty in walking, not elsewhere classified (R26.2)     Time: 7915-0569 PT Time Calculation (min) (ACUTE ONLY): 21 min  Charges:  $Gait Training: 8-22 mins                     Wyona Almas, PT, DPT Acute Rehabilitation Services Pager [phone] Office [phone]    Deno Etienne 12/17/2021, 4:56 PM

## 2021-12-17 NOTE — Progress Notes (Signed)
History and Physical  [name] QVZ:[id number] DOB: 02-16-1933 DOA: 12/13/2021  Referring physician: Dr. Lavone Orn, [place]  PCP: [name], MD  Outpatient Specialists: Cardiology Patient coming from: Home. Chief Complaint: Generalized weakness, fall last night.  HPI: [name] is a 88 y.o. male with medical history significant for coronary artery calcification, severe aortic stenosis status post TAVR, HFpEF, moderate mitral regurgitation, moderate tricuspid regurgitation, type 2 diabetes, essential hypertension, persistent atrial fibrillation not on oral anticoagulation, peripheral artery disease, CKD 3B, prior GI bleed with gastric ulcers, previous untreated H. pylori infection, who presented to [facility] ED with complaints of generalized weakness and a fall the night prior to presentation.  Work-up revealed symptomatic anemia with hemoglobin of 6.5K and positive FOBT.  1 unit of PRBCs was transfused.  Seen by GI, post EGD on 12/14/21 which showed multiple gastric ulcers.  Recommended twice daily PPI x2 months followed by daily PPI indefinitely.  He was noted to have a history of H. pylori without any documented treatment in the past.  He was started on H. pylori antibiotic therapy.  Hospital course complicated by A-fib with RVR, was restarted on his home Toprol-XL and his home dose was lowered to avoid hypotension.  12/17/21: Patient was seen in his bedside.  There were no acute events overnight.  States he had a bowel movement today which was brown.  He has no new complaints.  GI signed off.  Patient is medically cleared for discharge.  Pending SNF placement.  Past Medical History:  Diagnosis Date   Anemia    LOW PLATELETS OTHER DAY  PER PT   Anticoagulated on Coumadin 12/21/2012   Aortic stenosis    Arthritis    "left wrist; back sometimes" (01/21/2013)   Atrial fibrillation ([place])    [name], DR COOPER   Bradycardia    Exertional shortness of breath    Heart murmur    "I've had  it for years; runs in the family on daddy's side" (01/21/2013)   HTN (hypertension)    Hyperlipemia 12/21/2012   Prostate cancer ([place])    "had 40 tx of radiation in 2009" (01/21/2013)   S/P aortic valve replacement with bioprosthetic valve 01/19/2013   Transcatheter Aortic Valve Replacement using 2mm Sapien bioprosthetic tissue valve via transapical approach   Type II diabetes mellitus ([place])    Past Surgical History:  Procedure Laterality Date   BALLOON DILATION N/A 08/02/2020   Procedure: BALLOON DILATION;  Surgeon: [name], MD;  Location: [facility];  Service: Gastroenterology;  Laterality: N/A;   BIOPSY  10/03/2021   Procedure: BIOPSY;  Surgeon: [name], MD;  Location: [facility];  Service: Endoscopy;;   BIOPSY  12/14/2021   Procedure: BIOPSY;  Surgeon: Wilford Corner, MD;  Location: [place];  Service: Endoscopy;;   CARDIAC CATHETERIZATION  12/16/2012   CARDIAC VALVE REPLACEMENT  01/19/2013   AVR   CATARACT EXTRACTION W/ INTRAOCULAR LENS  IMPLANT, BILATERAL     ESOPHAGOGASTRODUODENOSCOPY N/A 10/03/2021   Procedure: ESOPHAGOGASTRODUODENOSCOPY (EGD);  Surgeon: [name], MD;  Location: [facility];  Service: Endoscopy;  Laterality: N/A;   ESOPHAGOGASTRODUODENOSCOPY N/A 12/14/2021   Procedure: ESOPHAGOGASTRODUODENOSCOPY (EGD);  Surgeon: Wilford Corner, MD;  Location: [place];  Service: Endoscopy;  Laterality: N/A;   ESOPHAGOGASTRODUODENOSCOPY (EGD) WITH PROPOFOL N/A 08/02/2020   Procedure: ESOPHAGOGASTRODUODENOSCOPY (EGD) WITH PROPOFOL;  Surgeon: [name], MD;  Location: [facility];  Service: Gastroenterology;  Laterality: N/A;   EYE SURGERY     INTRAOPERATIVE TRANSESOPHAGEAL ECHOCARDIOGRAM N/A 01/19/2013  Procedure: INTRAOPERATIVE TRANSESOPHAGEAL ECHOCARDIOGRAM;  Surgeon: [name], MD;  Location: [place];  Service: Open Heart Surgery;  Laterality: N/A;   ORIF FOREARM FRACTURE Left 1954   "compound fx" (01/21/2013)   TRANSTHORACIC ECHOCARDIOGRAM   09/04/10, 09/07/08    Social History:  reports that he quit smoking about 45 years ago. His smoking use included cigarettes. He has a 44.00 pack-year smoking history. He has never used smokeless tobacco. He reports that he does not drink alcohol and does not use drugs.   Allergies  Allergen Reactions   Amoxicillin Other (See Comments)    Burning of mouth, tongue and lips.  itchy, burning throat    Family History  Problem Relation Age of Onset   Diabetes Mother    Melanoma Mother 25   Bone cancer Mother 33   Pancreatic cancer Father 68   Heart disease Father    Stroke Sister    Cancer Brother 5       ? brain cancer      Prior to Admission medications   Medication Sig Start Date End Date Taking? Authorizing Provider  ACCU-CHEK AVIVA PLUS test strip  05/23/18   [provider]  ACCU-CHEK SOFTCLIX LANCETS lancets daily. use as directed 04/07/18   [provider]  Acetaminophen (TYLENOL ARTHRITIS PAIN PO) Take 650 mg by mouth daily with breakfast.    [provider]  Amino Acids-Protein Hydrolys (FEEDING SUPPLEMENT, PRO-STAT SUGAR FREE 64,) LIQD Take 30 mLs by mouth 2 (two) times daily. Patient not taking: Reported on 11/08/2021    [provider]  aspirin EC 81 MG tablet Take 1 tablet (81 mg total) by mouth daily. Swallow whole. 11/19/21   [name], MD  B Complex Vitamins (B COMPLEX 50) TABS Take 1 tablet by mouth daily with breakfast.    [provider]  bisacodyl (DULCOLAX) 10 MG suppository Place rectally as needed for moderate constipation. If not relieved by MOM, give 10 mg Bisacodyl suppositiory in 24 hours as needed Patient not taking: Reported on 11/08/2021    [provider]  carvedilol (COREG) 12.5 MG tablet Take 12.5 mg by mouth 2 (two) times daily. 11/08/21   [provider]  cholecalciferol (VITAMIN D3) 25 MCG (1000 UNIT) tablet Take 1,000 Units by mouth 2 (two) times daily.    [provider]   dapagliflozin propanediol (FARXIGA) 10 MG TABS tablet Take 1 tablet (10 mg total) by mouth daily before breakfast. 10/23/21   Medina-Vargas, Monina C, NP  doxazosin (CARDURA) 8 MG tablet Take 1 tablet (8 mg total) by mouth daily. 10/23/21   Medina-Vargas, Monina C, NP  enzalutamide (XTANDI) 40 MG capsule Take 4 capsules (160 mg total) by mouth daily at 2 PM. 10/23/21   Medina-Vargas, Monina C, NP  ferrous sulfate 325 (65 FE) MG tablet Take 325 mg by mouth See admin instructions. Take one tablet (325 mg) by mouth twice daily - with lunch and supper    [provider]  gabapentin (NEURONTIN) 100 MG capsule Take 1 capsule (100 mg total) by mouth at bedtime. 10/23/21   Medina-Vargas, Monina C, NP  lisinopril (ZESTRIL) 20 MG tablet Take 1 tablet (20 mg total) by mouth daily. 10/23/21   Medina-Vargas, Monina C, NP  magnesium hydroxide (MILK OF MAGNESIA) 400 MG/5ML suspension Take by mouth daily as needed for mild constipation. If no BM in 3 days, give 30 cc Milk of Magnesium in 24 hours as needed Patient not taking: Reported on 11/08/2021  [provider]  metFORMIN (GLUCOPHAGE) 1000 MG tablet Take 1 tablet (1,000 mg total) by mouth 2 (two) times daily with breakfast and lunch. 10/23/21   Medina-Vargas, Monina C, NP  metoprolol succinate (TOPROL-XL) 50 MG 24 hr tablet Take 1 tablet (50 mg total) by mouth daily. Take with or immediately following a meal. 10/23/21   Medina-Vargas, Monina C, NP  pantoprazole (PROTONIX) 40 MG tablet Take 1 tablet (40 mg total) by mouth daily. 10/23/21   Medina-Vargas, Monina C, NP  rosuvastatin (CRESTOR) 5 MG tablet TAKE 1 TABLET BY MOUTH ONCE DAILY AT  6PM 10/23/21   Medina-Vargas, Monina C, NP  Selenium 200 MCG CAPS Take 200 mcg by mouth daily with breakfast.    [provider]  simethicone (MYLICON) 80 MG chewable tablet Chew 1 tablet (80 mg total) by mouth 4 (four) times daily as needed for flatulence. Patient not taking: Reported on 11/08/2021  10/08/21   Lavina Hamman, MD  Sodium Phosphates (RA SALINE ENEMA RE) If not relieved by Biscodyl suppository, give disposable Saline Enema rectally x1 dose 24 hrs as needed Patient not taking: Reported on 11/08/2021    [provider]  torsemide (DEMADEX) 20 MG tablet Take 2 tablets (40 mg total) by mouth 2 (two) times daily. 11/23/21 02/21/22  [name], MD  verapamil (CALAN-SR) 180 MG CR tablet Take 180 mg by mouth daily. 11/08/21   [provider]  Vitamin A 2400 MCG (8000 UT) TABS Take 2,400 mcg by mouth daily with breakfast.    [provider]  vitamin B-12 (CYANOCOBALAMIN) 1000 MCG tablet Take 1,000 mcg by mouth daily.    [provider]  vitamin C (ASCORBIC ACID) 500 MG tablet Take 500 mg by mouth 2 (two) times daily.    [provider]  zinc gluconate 50 MG tablet Take 50 mg by mouth daily with breakfast.    [provider]    Physical Exam: BP 100/62 (BP Location: Right Arm)    Pulse 100    Temp 97.8 F (36.6 C) (Oral)    Resp 19    SpO2 98%   General: 88 y.o. year-old male frail-appearing no acute distress.  He is alert and oriented x3.   Cardiovascular: Irregular rate and rhythm no rubs or gallops.   Respiratory: Clear to auscultation with no wheezes or rales.  Poor inspiratory effort.   Abdomen: Soft nontender bowel sounds present. Muskuloskeletal: Trace lower extremity edema bilaterally. Neuro: Nonfocal exam.  Moves all 4 extremities. Skin: Bruising affecting upper and lower extremities bilaterally. Psychiatry: Mood is appropriate for condition and setting.         Labs on Admission:  Basic Metabolic Panel: Recent Labs  Lab 12/13/21 0726 12/14/21 0545 12/15/21 0548 12/16/21 0102 12/17/21 0043  NA 139 141 141 137 133*  K 4.8 4.2 4.2 3.7 3.9  CL 101 106 105 106 104  CO2 24 25 20* 23 21*  GLUCOSE 129* 117* 88 178* 191*  BUN 82* 65* 50* 44* 39*  CREATININE 1.98* 1.56* 1.44* 1.24 1.22  CALCIUM 9.0 8.8*  8.7* 8.4* 8.3*  MG  --  2.1  --   --   --   PHOS  --  5.0*  --   --   --    Liver Function Tests: Recent Labs  Lab 12/13/21 0726 12/14/21 0545 12/15/21 0548  AST 30 29 29   ALT 17 17 20   ALKPHOS 58 58 67  BILITOT 0.7 1.4* 1.7*  PROT 5.7*  5.3* 5.5*  ALBUMIN 3.1* 2.8* 2.8*   No results for input(s): LIPASE, AMYLASE in the last 168 hours. No results for input(s): AMMONIA in the last 168 hours. CBC: Recent Labs  Lab 12/13/21 0726 12/13/21 2029 12/14/21 0545 12/15/21 0548 12/16/21 0102 12/17/21 0043  WBC 4.8 4.6 5.5 7.9 7.3 6.3  NEUTROABS 3.2  --   --   --   --   --   HGB 6.5* 7.5* 8.7* 9.2* 8.7* 8.4*  HCT 20.9* 22.7* 26.6* 29.5* 26.9* 26.1*  MCV 99.1 97.4 95.7 100.0 97.5 99.6  PLT 208 197 204 215 181 187   Cardiac Enzymes: No results for input(s): CKTOTAL, CKMB, CKMBINDEX, TROPONINI in the last 168 hours.  BNP (last 3 results) Recent Labs    12/13/21 1455  BNP 768.6*    ProBNP (last 3 results) Recent Labs    12/05/21 0915  PROBNP 6,343*    CBG: Recent Labs  Lab 12/16/21 1126 12/16/21 1554 12/16/21 2127 12/17/21 0639 12/17/21 1124  GLUCAP 173* 229* 187* 165* 155*    Radiological Exams on Admission: No results found.  EKG: I independently viewed the EKG done and my findings are as followed: Atrial fibrillation rate of 87 with PVCs.  Assessment/Plan Present on Admission: **None**  Principal Problem:   Weakness  Persistent generalized weakness, multifactorial secondary to symptomatic anemia, AKI, acute illness. Continue to treat underlying conditions. Received 1 unit PRBC for hemoglobin of 6.5. Hemoglobin 8.4 Continue to monitor H&H Continue IV Protonix 40 mg twice daily started in the ED. Hold off any anticoagulation or antiplatelets for now. PT OT assessment recommending SNF. TOC assisting with SNF placement.  Bleeding gastric ulcers/acute gastritis seen on EGD done on 12/14/2021 by Dr. Michail Sermon, GI. Continue IV Protonix 40 mg twice daily  as recommended by GI Plan to discharge on Closely monitor H&H. Continue to maintain MAP greater than 65.  Permanent A-fib with RVR Not oral anticoagulation prior to admission Restarted home Toprol-XL at lower dose 12.5 mg daily to avoid hypotension. Continue to closely monitor on telemetry.  Acute blood loss anemia with suspected upper GI bleed with history of gastric ulcers seen on EGD done on 10/03/2021 by Dr. Watt Climes. Maintain hemoglobin above 8 due to underlying cardiac history. Hemoglobin dropped this morning 8.7 from 9.2 from 8.7 from 7.5, post 1 unit PRBC transfusion. Post EGD by Dr. Michail Sermon, it revealed oozing gastric ulcers with pigmented material. Biopsied. Acute gastritis. Biopsied.  GI recommended to continue IV Protonix 40 mg a day. Continue to closely monitor H&H twice daily PPI x2 months followed by daily PPI indefinitely.  He was noted to have a history of H. pylori without any documented treatment in the past.  He was started on H. pylori antibiotic therapy.  H. pylori infection, POA Management as stated above  Resolving AKI on CKD 3B Baseline creatinine appears to be 1.4 with GFR of 47. Presented with creatinine of 1.98 with GFR of 32 Renal function is back to baseline. Continue to avoid nephrotoxic agents, and hypotension. Continue to monitor urine output with strict I's and O's Repeat renal panel in the morning.  Severe iron deficiency anemia Last iron studies done on 10/04/2021 showed severe iron deficiency Received IV iron, Feraheme on 12/15/2021. Continue oral iron supplement daily.  Chronic diastolic CHF Last 2D echo done on 11/11/2021 revealed LVEF 60 to 65% with no regional wall motion abnormalities.  Left atrial size and right atrial size was severely dilated.  There was severely elevated pulmonary artery systolic  pressure. Continue strict I's and O's and daily weight. Hold off home ACE inhibitor due to soft BPs to avoid hypotension in the setting of GI  bleed. Toprol-XL was restarted on 12/15/2021 due to A-fib with RVR. Net I&O -929 cc  BPH Continue to hold off home Cardura for now to avoid hypotension, BPs are soft.  Hyperlipidemia Continue to hold off Crestor for now.  Type 2 diabetes with hyperglycemia Hemoglobin A1c 5.4 on 12/15/2021. Avoid hypoglycemia.  Recurrent fall Per ED staff, family reports frequent falls at home PT OT assessment on 12/14/2021, recommending SNF. Continue PT OT with assistance and fall precautions.         DVT prophylaxis: SCDs  Code Status: Full code  Family Communication: Updated his daughter via phone  Disposition Plan: Likely will discharge to SNF once bed is available.  Medically cleared for discharge.  Consults called: GI, standard.  Admission status: Inpatient status.   Status is: Inpatient  Patient requires at least 2 midnights for further evaluation and treatment of present condition.     Kayleen Memos MD [facility] Pager [phone]  If 7PM-7AM, please contact night-coverage [email or web address] Password [facility]  12/17/2021, 11:48 AM

## 2021-12-18 DIAGNOSIS — E1142 Type 2 diabetes mellitus with diabetic polyneuropathy: Secondary | ICD-10-CM | POA: Diagnosis not present

## 2021-12-18 DIAGNOSIS — R296 Repeated falls: Secondary | ICD-10-CM | POA: Diagnosis not present

## 2021-12-18 DIAGNOSIS — N179 Acute kidney failure, unspecified: Secondary | ICD-10-CM | POA: Diagnosis not present

## 2021-12-18 DIAGNOSIS — A048 Other specified bacterial intestinal infections: Secondary | ICD-10-CM | POA: Diagnosis not present

## 2021-12-18 DIAGNOSIS — D689 Coagulation defect, unspecified: Secondary | ICD-10-CM | POA: Diagnosis not present

## 2021-12-18 DIAGNOSIS — M6259 Muscle wasting and atrophy, not elsewhere classified, multiple sites: Secondary | ICD-10-CM | POA: Diagnosis not present

## 2021-12-18 DIAGNOSIS — M6281 Muscle weakness (generalized): Secondary | ICD-10-CM | POA: Diagnosis not present

## 2021-12-18 DIAGNOSIS — E782 Mixed hyperlipidemia: Secondary | ICD-10-CM | POA: Diagnosis not present

## 2021-12-18 DIAGNOSIS — I5032 Chronic diastolic (congestive) heart failure: Secondary | ICD-10-CM | POA: Diagnosis not present

## 2021-12-18 DIAGNOSIS — B351 Tinea unguium: Secondary | ICD-10-CM | POA: Diagnosis not present

## 2021-12-18 DIAGNOSIS — Z7401 Bed confinement status: Secondary | ICD-10-CM | POA: Diagnosis not present

## 2021-12-18 DIAGNOSIS — R6889 Other general symptoms and signs: Secondary | ICD-10-CM | POA: Diagnosis not present

## 2021-12-18 DIAGNOSIS — N1832 Chronic kidney disease, stage 3b: Secondary | ICD-10-CM | POA: Diagnosis not present

## 2021-12-18 DIAGNOSIS — M79674 Pain in right toe(s): Secondary | ICD-10-CM | POA: Diagnosis not present

## 2021-12-18 DIAGNOSIS — K922 Gastrointestinal hemorrhage, unspecified: Secondary | ICD-10-CM | POA: Diagnosis not present

## 2021-12-18 DIAGNOSIS — D62 Acute posthemorrhagic anemia: Secondary | ICD-10-CM | POA: Diagnosis not present

## 2021-12-18 DIAGNOSIS — K259 Gastric ulcer, unspecified as acute or chronic, without hemorrhage or perforation: Secondary | ICD-10-CM | POA: Diagnosis not present

## 2021-12-18 DIAGNOSIS — E1149 Type 2 diabetes mellitus with other diabetic neurological complication: Secondary | ICD-10-CM | POA: Diagnosis not present

## 2021-12-18 DIAGNOSIS — Z741 Need for assistance with personal care: Secondary | ICD-10-CM | POA: Diagnosis not present

## 2021-12-18 DIAGNOSIS — E440 Moderate protein-calorie malnutrition: Secondary | ICD-10-CM | POA: Diagnosis not present

## 2021-12-18 DIAGNOSIS — I482 Chronic atrial fibrillation, unspecified: Secondary | ICD-10-CM | POA: Diagnosis not present

## 2021-12-18 DIAGNOSIS — R2681 Unsteadiness on feet: Secondary | ICD-10-CM | POA: Diagnosis not present

## 2021-12-18 DIAGNOSIS — R531 Weakness: Secondary | ICD-10-CM | POA: Diagnosis not present

## 2021-12-18 DIAGNOSIS — R238 Other skin changes: Secondary | ICD-10-CM | POA: Diagnosis not present

## 2021-12-18 DIAGNOSIS — E119 Type 2 diabetes mellitus without complications: Secondary | ICD-10-CM | POA: Diagnosis not present

## 2021-12-18 DIAGNOSIS — R443 Hallucinations, unspecified: Secondary | ICD-10-CM | POA: Diagnosis not present

## 2021-12-18 DIAGNOSIS — D649 Anemia, unspecified: Secondary | ICD-10-CM | POA: Diagnosis not present

## 2021-12-18 DIAGNOSIS — D7589 Other specified diseases of blood and blood-forming organs: Secondary | ICD-10-CM | POA: Diagnosis not present

## 2021-12-18 DIAGNOSIS — M79675 Pain in left toe(s): Secondary | ICD-10-CM | POA: Diagnosis not present

## 2021-12-18 LAB — BASIC METABOLIC PANEL
Anion gap: 10 (ref 5–15)
BUN: 40 mg/dL — ABNORMAL HIGH (ref 8–23)
CO2: 22 mmol/L (ref 22–32)
Calcium: 8.5 mg/dL — ABNORMAL LOW (ref 8.9–10.3)
Chloride: 101 mmol/L (ref 98–111)
Creatinine, Ser: 1.21 mg/dL (ref 0.61–1.24)
GFR, Estimated: 58 mL/min — ABNORMAL LOW (ref >60)
Glucose, Bld: 165 mg/dL — ABNORMAL HIGH (ref 70–99)
Potassium: 4 mmol/L (ref 3.5–5.1)
Sodium: 133 mmol/L — ABNORMAL LOW (ref 135–145)

## 2021-12-18 LAB — RESP PANEL BY RT-PCR (FLU A&B, COVID) ARPGX2
Influenza A by PCR: NEGATIVE
Influenza B by PCR: NEGATIVE
SARS Coronavirus 2 by RT PCR: NEGATIVE

## 2021-12-18 LAB — CBC
HCT: 27.2 % — ABNORMAL LOW (ref 39.0–52.0)
Hemoglobin: 8.9 g/dL — ABNORMAL LOW (ref 13.0–17.0)
MCH: 32.4 pg (ref 26.0–34.0)
MCHC: 32.7 g/dL (ref 30.0–36.0)
MCV: 98.9 fL (ref 80.0–100.0)
Platelets: 190 10*3/uL (ref 150–400)
RBC: 2.75 MIL/uL — ABNORMAL LOW (ref 4.22–5.81)
RDW: 16.9 % — ABNORMAL HIGH (ref 11.5–15.5)
WBC: 5.9 10*3/uL (ref 4.0–10.5)
nRBC: 0.3 % — ABNORMAL HIGH (ref 0.0–0.2)

## 2021-12-18 LAB — GLUCOSE, CAPILLARY
Glucose-Capillary: 171 mg/dL — ABNORMAL HIGH (ref 70–99)
Glucose-Capillary: 272 mg/dL — ABNORMAL HIGH (ref 70–99)

## 2021-12-18 LAB — SURGICAL PATHOLOGY

## 2021-12-18 MED ORDER — ASPIRIN EC 81 MG PO TBEC: 81.0000 mg | DELAYED_RELEASE_TABLET | Freq: Every day | ORAL | 0 refills | Status: AC

## 2021-12-18 MED ORDER — PANTOPRAZOLE SODIUM 40 MG PO TBEC: 40.0000 mg | DELAYED_RELEASE_TABLET | Freq: Two times a day (BID) | ORAL | 0 refills | Status: DC

## 2021-12-18 MED ORDER — METOPROLOL SUCCINATE ER 25 MG PO TB24
12.5000 mg | ORAL_TABLET | Freq: Every day | ORAL | 0 refills | Status: DC
Start: 2021-12-19 — End: 2022-01-31

## 2021-12-18 MED ORDER — PANTOPRAZOLE SODIUM 40 MG PO TBEC: 40.0000 mg | DELAYED_RELEASE_TABLET | Freq: Every day | ORAL | 0 refills | Status: DC

## 2021-12-18 MED ORDER — CLARITHROMYCIN 500 MG PO TABS: 500.0000 mg | ORAL_TABLET | Freq: Two times a day (BID) | ORAL | 0 refills | Status: AC

## 2021-12-18 MED ORDER — GERHARDT'S BUTT CREAM
TOPICAL_CREAM | Freq: Every day | CUTANEOUS | Status: DC | PRN
  Filled 2021-12-18: qty 1

## 2021-12-18 MED ORDER — METRONIDAZOLE 250 MG PO TABS: 250.0000 mg | ORAL_TABLET | Freq: Three times a day (TID) | ORAL | 0 refills | Status: AC

## 2021-12-18 MED ORDER — FERROUS SULFATE 325 (65 FE) MG PO TABS: 325.0000 mg | ORAL_TABLET | Freq: Two times a day (BID) | ORAL | 0 refills | Status: DC

## 2021-12-18 MED ORDER — ASPIRIN EC 81 MG PO TBEC: 81.0000 mg | DELAYED_RELEASE_TABLET | Freq: Every day | ORAL | 0 refills | Status: DC

## 2021-12-18 MED ORDER — GERHARDT'S BUTT CREAM: 1.0000 "application " | TOPICAL_CREAM | Freq: Every day | CUTANEOUS | 0 refills | Status: DC | PRN

## 2021-12-18 NOTE — Plan of Care (Signed)
°  Problem: Education: Goal: Knowledge of General Education information will improve Description: Including pain rating scale, medication(s)/side effects and non-pharmacologic comfort measures Outcome: Progressing   Problem: Health Behavior/Discharge Planning: Goal: Ability to manage health-related needs will improve Outcome: Progressing   Problem: Clinical Measurements: Goal: Will remain free from infection Outcome: Progressing Goal: Diagnostic test results will improve Outcome: Progressing Goal: Respiratory complications will improve Outcome: Progressing Goal: Cardiovascular complication will be avoided Outcome: Progressing   Problem: Activity: Goal: Risk for activity intolerance will decrease Outcome: Progressing   Problem: Nutrition: Goal: Adequate nutrition will be maintained Outcome: Progressing   Problem: Coping: Goal: Level of anxiety will decrease Outcome: Progressing   Problem: Pain Managment: Goal: General experience of comfort will improve Outcome: Progressing   Problem: Safety: Goal: Ability to remain free from injury will improve Outcome: Progressing   Problem: Skin Integrity: Goal: Risk for impaired skin integrity will decrease Outcome: Progressing

## 2021-12-18 NOTE — TOC Progression Note (Signed)
Transition of Care [facility]) - Progression Note    Patient Details  Name: [name] MRN: [id number] Date of Birth: 14-Aug-1933  Transition of Care [facility]) CM/SW [place], [place] Phone Number: 12/18/2021, 9:49 AM  Clinical Narrative:    CSW spoke with pt to confirm SNF, pt stated that he wanted [facility] and that he has been there before. CSW contacted [facility] to confirm that pt has a bed. Kitty at [facility] remembers pt and will get pt bed today after DC Summary is placed.    Expected Discharge Plan: [place] Barriers to Discharge: Continued Medical Work up, Ship broker, SNF Pending bed offer  Expected Discharge Plan and Services Expected Discharge Plan: [place] arrangements for the past 2 months: Single Family Home Expected Discharge Date: 12/18/21                                     Social Determinants of Health (SDOH) Interventions    Readmission Risk Interventions No flowsheet data found.

## 2021-12-18 NOTE — TOC Transition Note (Signed)
Transition of Care [facility]) - CM/SW Discharge Note   Patient Details  Name: [name] MRN: [id number] Date of Birth: [birth year]  [facility] of [facility]) CM/SW Contact:  Tresa Endo Phone Number: 12/18/2021, 11:27 AM   Clinical Narrative:    Patient will DC to: [facility] Anticipated DC date: 12/18/2021 Family notified: Pt daughter Transport by: Corey Harold   Per MD patient ready for DC to [facility]. RN to call report prior to discharge [phone]). RN, patient, patient's family, and facility notified of DC. Discharge Summary and FL2 sent to facility. DC packet on chart. Ambulance transport requested for patient.   CSW will sign off for now as social work intervention is no longer needed. Please consult [place] again if new needs arise.       Barriers to Discharge: Continued Medical Work up, Ship broker, SNF Pending bed offer   Patient Goals and CMS Choice Patient states their goals for this hospitalization and ongoing recovery are:: To be able to return home CMS Medicare.gov Compare Post Acute Care list provided to:: Patient Choice offered to / list presented to : Patient  Discharge Placement                       Discharge Plan and Services                                     Social Determinants of Health (SDOH) Interventions     Readmission Risk Interventions No flowsheet data found.

## 2021-12-18 NOTE — Discharge Summary (Addendum)
Discharge Summary  [name] WNU:[id number] DOB: 04/08/1933  PCP: [name], MD  Admit date: 12/13/2021 Discharge date: 12/18/2021  Time spent: 35 minutes.  Recommendations for Outpatient Follow-up:  Follow-up with your gastroenterologist within a week. Follow-up with your primary care provider in 1 to 2 weeks. Follow-up with your cardiologist in 1 to 2 weeks. Take your medications as prescribed Continue PT OT with assistance and fall precautions.  Discharge Diagnoses:  Active Hospital Problems   Diagnosis Date Noted   Weakness 12/13/2021    Resolved Hospital Problems  No resolved problems to display.    Discharge Condition: Stable  Diet recommendation: Resume previous diet.  Vitals:   12/18/21 0934 12/18/21 1102  BP: (!) 110/57 122/61  Pulse:  83  Resp:  20  Temp:  97.7 F (36.5 C)  SpO2:  96%    History of present illness:  [name] is a 88 y.o. male with medical history significant for coronary artery calcification, severe aortic stenosis status post TAVR, HFpEF, moderate mitral regurgitation, moderate tricuspid regurgitation, type 2 diabetes, essential hypertension, persistent atrial fibrillation not on oral anticoagulation, peripheral artery disease, CKD 3B, prior GI bleed with gastric ulcers, previous untreated H. pylori infection, who presented to [facility] ED with complaints of generalized weakness and a fall the night prior to presentation.  Work-up revealed symptomatic anemia with hemoglobin of 6.5K and positive FOBT.  1 unit of PRBCs was transfused.  Seen by GI, post EGD on 12/14/21 which showed multiple gastric ulcers.  Recommended twice daily PPI x2 months followed by daily PPI indefinitely.  He was noted to have a history of H. pylori without any documented treatment in the past.  He was started on H. pylori antibiotic therapy.   Hospital course complicated by A-fib with RVR, was restarted on his home Toprol-XL and his home dose was lowered to avoid  hypotension.  GI bleed resolved, GI signed off on 12/17/2021.   12/18/21: Patient was seen at his bedside.  There were no acute events overnight.  He has no new complaints.  Last bowel movement was brown.  Hospital Course:  Principal Problem:   Weakness  Improving generalized weakness, multifactorial secondary to symptomatic anemia, AKI, acute illness. Continue to treat underlying conditions. Received 1 unit PRBC for hemoglobin of 6.5K. Hemoglobin uptrending 8.9K from 8.4K Continue p.o. Protonix 40 mg twice daily tomorrow then daily indefinitely, as recommended by GI. PT OT assessment recommending SNF. TOC assisting with SNF placement.   Resolved bleeding gastric ulcers/acute gastritis seen on EGD done on 12/14/2021 by Dr. Michail Sermon, GI. Continue management as stated above. Per GI, Dr. Alessandra Bevels, okay to resume home aspirin.   Permanent A-fib with RVR Not oral anticoagulation prior to admission Continue home Toprol-XL at lower dose 12.5 mg daily to avoid hypotension. Follow-up with your cardiologist.   Resolved acute blood loss anemia with suspected upper GI bleed with history of gastric ulcers seen on EGD done on 10/03/2021 by Dr. Watt Climes. Maintain hemoglobin above 8 due to underlying cardiac history. Post EGD by Dr. Michail Sermon, it revealed oozing gastric ulcers with pigmented material. Biopsied. Acute gastritis. Biopsied.  GI recommended to continue IV Protonix 40 mg a day and switch to oral for DC planning. Protonix p.o. 40 mg twice daily PPI x2 months followed by daily PPI indefinitely.  He was noted to have a history of H. pylori without any documented treatment in the past.  He was started on H. pylori antibiotic therapy by GI.   H. pylori  infection, POA Recommend treating for H. Pylori, patient has allergy to amoxicillin. We will start clarithromycin 500 mg twice a day, Flagyl to 50 mg 3 times a day, with pantoprazole twice a day for total of 14 days.   Resolved AKI on CKD  3B Baseline creatinine appears to be 1.2 with GFR of 47. Presented with creatinine of 1.98 with GFR of 32 Renal function is back to baseline. Continue to avoid nephrotoxic agents, and hypotension. Follow-up with your primary care provider.   Severe iron deficiency anemia Last iron studies done on 10/04/2021 showed severe iron deficiency Received IV iron, Feraheme on 12/15/2021. Continue oral iron supplement daily.   Chronic diastolic CHF Last 2D echo done on 11/11/2021 revealed LVEF 60 to 65% with no regional wall motion abnormalities.  Left atrial size and right atrial size was severely dilated.  There was severely elevated pulmonary artery systolic pressure. Continue strict I's and O's and daily weight. Hold off home ACE inhibitor due to soft BPs to avoid hypotension in the setting of GI bleed. Toprol-XL was restarted on 12/15/2021 due to A-fib with RVR. Net I&O -929 cc   BPH Continue to hold off home Cardura for now to avoid hypotension, BPs are soft. Follow-up with your primary care provider.   Hyperlipidemia Resume home regimen.   Hyperglycemia. Hemoglobin A1c 5.4 on 12/15/2021. Diet controlled. Avoid hypoglycemia.   Recurrent fall Per ED staff, family reports frequent falls at home PT OT assessment on 12/14/2021, recommending SNF. Continue PT OT with assistance and fall precautions.            Code Status: Full code   Family Communication: Updated his daughter via phone   Disposition Plan: Likely will discharge to SNF once bed is available.  Medically cleared for discharge.   Consults called: GI   Discharge Exam: BP 122/61 (BP Location: Right Arm)    Pulse 83    Temp 97.7 F (36.5 C) (Oral)    Resp 20    SpO2 96%  General: 88 y.o. year-old male well developed well nourished in no acute distress.  Alert and oriented x3. Cardiovascular: Irregular rate and rhythm with no rubs or gallops.  No thyromegaly or JVD noted.   Respiratory: Clear to auscultation with no  wheezes or rales. Good inspiratory effort. Abdomen: Soft nontender nondistended with normal bowel sounds x4 quadrants. Musculoskeletal: Trace lower extremity edema bilaterally. Psychiatry: Mood is appropriate for condition and setting  Discharge Instructions You were cared for by a hospitalist during your hospital stay. If you have any questions about your discharge medications or the care you received while you were in the hospital after you are discharged, you can call the unit and asked to speak with the hospitalist on call if the hospitalist that took care of you is not available. Once you are discharged, your primary care physician will handle any further medical issues. Please note that NO REFILLS for any discharge medications will be authorized once you are discharged, as it is imperative that you return to your primary care physician (or establish a relationship with a primary care physician if you do not have one) for your aftercare needs so that they can reassess your need for medications and monitor your lab values.   Allergies as of 12/18/2021       Reactions   Amoxicillin Other (See Comments)   Burning of mouth, tongue and lips.  itchy, burning throat        Medication List  STOP taking these medications    carvedilol 12.5 MG tablet Commonly known as: COREG   dapagliflozin propanediol 10 MG Tabs tablet Commonly known as: Farxiga   doxazosin 8 MG tablet Commonly known as: CARDURA   glipiZIDE 5 MG tablet Commonly known as: GLUCOTROL   lisinopril 20 MG tablet Commonly known as: ZESTRIL   torsemide 20 MG tablet Commonly known as: DEMADEX   verapamil 180 MG CR tablet Commonly known as: CALAN-SR       TAKE these medications    Accu-Chek Aviva Plus test strip Generic drug: glucose blood   Accu-Chek Softclix Lancets lancets daily. use as directed   acetaminophen 650 MG CR tablet Commonly known as: TYLENOL Take 650 mg by mouth daily with breakfast.    aspirin EC 81 MG tablet Take 1 tablet (81 mg total) by mouth daily. Swallow whole. What changed: when to take this   B Complex 50 Tabs Take 1 tablet by mouth daily with breakfast.   cholecalciferol 25 MCG (1000 UNIT) tablet Commonly known as: VITAMIN D3 Take 1,000 Units by mouth 2 (two) times daily with a meal.   clarithromycin 500 MG tablet Commonly known as: BIAXIN Take 1 tablet (500 mg total) by mouth every 12 (twelve) hours for 13 days.   ferrous sulfate 325 (65 FE) MG tablet Take 1 tablet (325 mg total) by mouth 2 (two) times daily with a meal.   gabapentin 100 MG capsule Commonly known as: Neurontin Take 1 capsule (100 mg total) by mouth at bedtime.   Gerhardt's butt cream Crea Apply 1 application topically daily as needed for irritation.   metFORMIN 1000 MG tablet Commonly known as: GLUCOPHAGE Take 1 tablet (1,000 mg total) by mouth 2 (two) times daily with breakfast and lunch.   metoprolol succinate 25 MG 24 hr tablet Commonly known as: TOPROL-XL Take 0.5 tablets (12.5 mg total) by mouth daily. Start taking on: December 19, 2021 What changed:  medication strength how much to take additional instructions   metroNIDAZOLE 250 MG tablet Commonly known as: FLAGYL Take 1 tablet (250 mg total) by mouth every 8 (eight) hours for 13 days.   pantoprazole 40 MG tablet Commonly known as: PROTONIX Take 1 tablet (40 mg total) by mouth 2 (two) times daily. What changed: You were already taking a medication with the same name, and this prescription was added. Make sure you understand how and when to take each.   pantoprazole 40 MG tablet Commonly known as: PROTONIX Take 1 tablet (40 mg total) by mouth daily with supper. Start taking on: February 17, 2022 What changed:  when to take this These instructions start on February 17, 2022. If you are unsure what to do until then, ask your doctor or other care provider.   PSYLLIUM PO Take 1 capsule by mouth daily.   rosuvastatin  5 MG tablet Commonly known as: CRESTOR TAKE 1 TABLET BY MOUTH ONCE DAILY AT  6PM What changed:  how much to take how to take this when to take this additional instructions   Selenium 200 MCG Caps Take 200 mcg by mouth daily with breakfast.   Vitamin A 2400 MCG (8000 UT) Tabs Take 2,400 mcg by mouth daily with breakfast.   vitamin B-12 1000 MCG tablet Commonly known as: CYANOCOBALAMIN Take 1,000 mcg by mouth 2 (two) times daily with a meal.   vitamin C 500 MG tablet Commonly known as: ASCORBIC ACID Take 500 mg by mouth 2 (two) times daily with a meal.  Xtandi 40 MG capsule Generic drug: enzalutamide Take 4 capsules (160 mg total) by mouth daily at 2 PM.   zinc gluconate 50 MG tablet Take 50 mg by mouth daily with breakfast.       Allergies  Allergen Reactions   Amoxicillin Other (See Comments)    Burning of mouth, tongue and lips.  itchy, burning throat    Follow-up Information     [name], MD. Call today.   Specialty: Family Medicine Why: Please call for a posthospital follow-up appointment. Contact information: [address] [place] [address] [phone]         [name], MD .   Specialty: Cardiology Contact information: [address] [place] [address] [phone]         Wilford Corner, MD. Call today.   Specialty: Gastroenterology Why: Please call for a posthospital follow-up appointment. Contact information: [address] [place] [address] [phone]                  The results of significant diagnostics from this hospitalization (including imaging, microbiology, ancillary and laboratory) are listed below for reference.    Significant Diagnostic Studies: CT HEAD WO CONTRAST (5MM)  Result Date: 12/13/2021 CLINICAL DATA:  Head trauma, moderate-severe EXAM: CT HEAD WITHOUT CONTRAST TECHNIQUE: Contiguous axial images were obtained from the base  of the skull through the vertex without intravenous contrast. RADIATION DOSE REDUCTION: This exam was performed according to the departmental dose-optimization program which includes automated exposure control, adjustment of the mA and/or kV according to patient size and/or use of iterative reconstruction technique. COMPARISON:  10/01/2021 FINDINGS: Brain: There is no acute intracranial hemorrhage, significant mass effect, or edema. Gray-white differentiation is preserved. There is no extra-axial fluid collection. Prominence of the ventricles and sulci reflects similar parenchymal volume loss. Patchy low-density in the supratentorial white matter is nonspecific but may reflect stable chronic microvascular ischemic changes. Similar prominence of the extra-axial space along the right cerebral convexity that could reflect a thin subdural hygroma. Vascular: There is atherosclerotic calcification at the skull base. Skull: Calvarium is unremarkable. Sinuses/Orbits: No acute finding. Other: None. IMPRESSION: No evidence of acute intracranial injury. 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 Electronically Signed   By: Macy Mis M.D.   On: 12/13/2021 08:26   CT Cervical Spine Wo Contrast  Result Date: 12/13/2021 CLINICAL DATA:  Neck trauma (Age >= 65y) EXAM: CT CERVICAL SPINE WITHOUT CONTRAST TECHNIQUE: Multidetector CT imaging of the cervical spine was performed without intravenous contrast. Multiplanar CT image reconstructions were also generated. RADIATION DOSE REDUCTION: This exam was performed according to the departmental dose-optimization program which includes automated exposure control, adjustment of the mA and/or kV according to patient size and/or use of iterative reconstruction technique. COMPARISON:  None. FINDINGS: Alignment: Mild degenerative listhesis. Skull base and vertebrae: Degenerative endplate irregularity. No acute fracture. Soft tissues and spinal canal: No prevertebral  fluid or swelling. No visible canal hematoma. Disc levels: Multilevel degenerative changes are present including disc space narrowing, endplate osteophytes, and facet and uncovertebral hypertrophy. Upper chest: No apical lung mass. Other: Atherosclerosis including significant calcification at the common carotid bifurcations. IMPRESSION: No acute cervical spine fracture. Electronically Signed   By: Macy Mis M.D.   On: 12/13/2021 08:33   DG Chest Portable 1 View  Result Date: 12/13/2021 CLINICAL DATA:  Shortness of breath in an 88 year old male. EXAM: PORTABLE CHEST 1 VIEW COMPARISON:  October 01, 2021.  FINDINGS: EKG leads project over the chest. Trachea is midline. Cardiomediastinal contours mild cardiac enlargement unchanged compared to recent imaging. Mild interstitial prominence is noted without frank signs of pulmonary edema. No lobar consolidation or sign of pleural fluid. No pneumothorax. On limited assessment there is no acute skeletal process. Marked LEFT glenohumeral degenerative changes are noted. IMPRESSION: Stable mild cardiac enlargement. Mild interstitial prominence slightly asymmetric could reflect changes of early CHF or atypical infection. Electronically Signed   By: Zetta Bills M.D.   On: 12/13/2021 11:50   ECHOCARDIOGRAM COMPLETE  Result Date: 11/21/2021    ECHOCARDIOGRAM REPORT   Patient Name:   IBRAHIMA HOLBERG Date of Exam: 11/21/2021 Medical Rec #:  [id number]         Height:       68.5 in Accession #:    [id number]        Weight:       171.6 lb Date of Birth:  [birth year]          BSA:          1.925 m Patient Age:    38 years          BP:           110/58 mmHg Patient Gender: M                 HR:           91 bpm. Exam Location:  [place] Procedure: 2D Echo, 3D Echo, Cardiac Doppler and Color Doppler Indications:    I50.32 Heart Failure with Preserved Ejection Fraction  History:        Patient has prior history of Echocardiogram examinations, most                 recent  08/18/2020. Pulmonary HTN, Aortic Valve Disease and                 Mitral Valve Disease, Arrythmias:Atrial Fibrillation,                 Signs/Symptoms:Edema; Risk Factors:Hypertension and                 Dyslipidemia. Severe Aortic Stenosis status post TAVR (2014,                 17mm Edwards Sapien TAVR Valve).  Sonographer:    Deliah Boston RDCS Referring Phys: [facility] A CHANDRASEKHAR IMPRESSIONS  1. Left ventricular ejection fraction, by estimation, is 60 to 65%. The left ventricle has normal function. The left ventricle has no regional wall motion abnormalities. Left ventricular diastolic parameters are indeterminate.  2. Mildly D-shaped septum suggestive of RV pressure/volume overload. Right ventricular systolic function is mildly reduced. The right ventricular size is mildly enlarged. There is severely elevated pulmonary artery systolic pressure. The estimated right  ventricular systolic pressure is 50.9 mmHg.  3. Left atrial size was severely dilated.  4. Right atrial size was severely dilated.  5. The mitral valve is degenerative. Probably severe mitral valve regurgitation, PISA ERO 0.41 cm^2. No evidence of mitral stenosis.  6. The tricuspid valve is abnormal. Tricuspid valve regurgitation is severe.  7. Bioprosthetic aortic valve s/p TAVR with 26 mm Edwards Sapien THV. Trivial perivalvular leakage. Mean gradient 9 mmHg, DI 0.38.  8. Aortic dilatation noted. There is mild dilatation of the ascending aorta, measuring 36 mm.  9. The inferior vena cava is dilated in size with <50% respiratory variability, suggesting right atrial pressure of 15 mmHg. FINDINGS  Left Ventricle: Left ventricular ejection fraction, by estimation, is 60 to 65%. The left ventricle has normal function. The left ventricle has no regional wall motion abnormalities. The left ventricular internal cavity size was normal in size. There is  no left ventricular hypertrophy. Left ventricular diastolic parameters are indeterminate. Right  Ventricle: Mildly D-shaped septum suggestive of RV pressure/volume overload. The right ventricular size is mildly enlarged. No increase in right ventricular wall thickness. Right ventricular systolic function is mildly reduced. There is severely elevated pulmonary artery systolic pressure. The tricuspid regurgitant velocity is 3.71 m/s, and with an assumed right atrial pressure of 15 mmHg, the estimated right ventricular systolic pressure is 00.9 mmHg. Left Atrium: Left atrial size was severely dilated. Right Atrium: Right atrial size was severely dilated. Pericardium: There is no evidence of pericardial effusion. Mitral Valve: The mitral valve is degenerative in appearance. There is moderate calcification of the mitral valve leaflet(s). Mild mitral annular calcification. Severe mitral valve regurgitation. No evidence of mitral valve stenosis. MV peak gradient, 8.0 mmHg. The mean mitral valve gradient is 2.0 mmHg. Tricuspid Valve: The tricuspid valve is abnormal. Tricuspid valve regurgitation is severe. Aortic Valve: Bioprosthetic aortic valve s/p TAVR with 26 mm Edwards Sapien THV. Trivial perivalvular leakage. Mean gradient 9 mmHg, DI 0.38. The aortic valve has been repaired/replaced. Aortic valve regurgitation is trivial. Aortic regurgitation PHT measures 289 msec. Aortic valve mean gradient measures 8.0 mmHg. Aortic valve peak gradient measures 17.2 mmHg. Aortic valve area, by VTI measures 1.33 cm. Pulmonic Valve: The pulmonic valve was normal in structure. Pulmonic valve regurgitation is trivial. Aorta: Aortic dilatation noted. There is mild dilatation of the ascending aorta, measuring 36 mm. Venous: The inferior vena cava is dilated in size with less than 50% respiratory variability, suggesting right atrial pressure of 15 mmHg. IAS/Shunts: No atrial level shunt detected by color flow Doppler.  LEFT VENTRICLE PLAX 2D LVIDd:         4.50 cm   Diastology LVIDs:         2.80 cm   LV e' medial:    6.92 cm/s LV PW:          1.10 cm   LV E/e' medial:  18.9 LV IVS:        1.00 cm   LV e' lateral:   10.90 cm/s LVOT diam:     2.20 cm   LV E/e' lateral: 12.0 LV SV:         52 LV SV Index:   27 LVOT Area:     3.80 cm                           3D Volume EF:                          3D EF:        59 %                          LV EDV:       117 ml                          LV ESV:       48 ml                          LV SV:  69 ml RIGHT VENTRICLE RV S prime:     8.58 cm/s TAPSE (M-mode): 0.8 cm LEFT ATRIUM              Index        RIGHT ATRIUM           Index LA diam:        4.90 cm  2.54 cm/m   RA Area:     38.70 cm LA Vol (A2C):   133.0 ml 69.08 ml/m  RA Volume:   152.00 ml 78.95 ml/m LA Vol (A4C):   123.0 ml 63.88 ml/m LA Biplane Vol: 130.0 ml 67.52 ml/m  AORTIC VALVE AV Area (Vmax):    1.32 cm AV Area (Vmean):   1.34 cm AV Area (VTI):     1.33 cm AV Vmax:           207.20 cm/s AV Vmean:          126.600 cm/s AV VTI:            0.392 m AV Peak Grad:      17.2 mmHg AV Mean Grad:      8.0 mmHg LVOT Vmax:         71.98 cm/s LVOT Vmean:        44.760 cm/s LVOT VTI:          0.137 m LVOT/AV VTI ratio: 0.35 AI PHT:            289 msec  AORTA Ao Root diam: 3.30 cm Ao Asc diam:  3.45 cm MITRAL VALVE                  TRICUSPID VALVE MV Area (PHT): cm            TR Peak grad:   55.1 mmHg MV Area VTI:   1.43 cm       TR Vmax:        371.00 cm/s MV Peak grad:  8.0 mmHg MV Mean grad:  2.0 mmHg       SHUNTS MV Vmax:       1.41 m/s       Systemic VTI:  0.14 m MV Vmean:      59.7 cm/s      Systemic Diam: 2.20 cm MV Decel Time: 208 msec MR Peak grad:    90.8 mmHg MR Mean grad:    54.0 mmHg MR Vmax:         476.33 cm/s MR Vmean:        340.3 cm/s MR PISA:         6.28 cm MR PISA Eff ROA: 41 mm MR PISA Radius:  1.00 cm MV E velocity: 130.75 cm/s MV A velocity: 41.30 cm/s MV E/A ratio:  3.17 Dalton McleanMD Electronically signed by Franki Monte Signature Date/Time: 11/21/2021/3:19:27 PM    Final    VAS US CAROTID  Result Date:  11/22/2021 Carotid Arterial Duplex Study Patient Name:  [name]  Date of Exam:   11/22/2021 Medical Rec #: [id number]          Accession #:    [id number] Date of Birth: 1933-03-11           Patient Gender: M Patient Age:   14 years Exam Location:  [facility] Procedure:      VAS US CAROTID Referring Phys: Roderic Palau BERRY --------------------------------------------------------------------------------  Indications:       Carotid artery disease and Patient denies any cerebrovascular  symptoms. Risk Factors:      Hypertension, Diabetes, past history of smoking. Comparison Study:  Prior carotid 11/22/2020, RICA 836/62 cm/s and LICA                    114/24cm/s. Performing Technologist: Leavy Cella RDCS Supporting Technologist: Mariane Masters RVT  Examination Guidelines: A complete evaluation includes B-mode imaging, spectral Doppler, color Doppler, and power Doppler as needed of all accessible portions of each vessel. Bilateral testing is considered an integral part of a complete examination. Limited examinations for reoccurring indications may be performed as noted.  Right Carotid Findings: +----------+--------+--------+--------+------------------+---------------------+             PSV cm/s EDV cm/s Stenosis Plaque Description Comments               +----------+--------+--------+--------+------------------+---------------------+  CCA Prox   90       8                 heterogenous                              +----------+--------+--------+--------+------------------+---------------------+  CCA Mid    90       14                heterogenous                              +----------+--------+--------+--------+------------------+---------------------+  CCA Distal 61       12                                                          +----------+--------+--------+--------+------------------+---------------------+  ICA Prox   144      29       1-39%    heterogenous       Unable to reproduce                                                               velocities obtained                                                              in prior exam.         +----------+--------+--------+--------+------------------+---------------------+  ICA Mid    145      27                                                          +----------+--------+--------+--------+------------------+---------------------+  ICA Distal 112      35                                                          +----------+--------+--------+--------+------------------+---------------------+  ECA        158      10                heterogenous                              +----------+--------+--------+--------+------------------+---------------------+ +----------+--------+-------+----------------+-------------------+             PSV cm/s EDV cms Describe         Arm Pressure (mmHG)  +----------+--------+-------+----------------+-------------------+  Subclavian 138              Multiphasic, WNL 125                  +----------+--------+-------+----------------+-------------------+ +---------+--------+--+--------+-+---------+  Vertebral PSV cm/s 48 EDV cm/s 6 Antegrade  +---------+--------+--+--------+-+---------+  Left Carotid Findings: +----------+--------+--------+--------+---------------------+------------------+             PSV cm/s EDV cm/s Stenosis Plaque Description    Comments            +----------+--------+--------+--------+---------------------+------------------+  CCA Prox   68       7                                                           +----------+--------+--------+--------+---------------------+------------------+  CCA Mid    58       13                                                          +----------+--------+--------+--------+---------------------+------------------+  CCA Distal 65       16                heterogenous                              +----------+--------+--------+--------+---------------------+------------------+  ICA  Prox   50       12       1-39%    irregular, calcific                                                              and diffuse                               +----------+--------+--------+--------+---------------------+------------------+  ICA Mid    71       17                                                          +----------+--------+--------+--------+---------------------+------------------+  ICA Distal 48       6                                                           +----------+--------+--------+--------+---------------------+------------------+  ECA        57       7                 irregular, diffuse    Shadowing, unable                                          and calcific          to obtain optimal                                                                velocities due                                                                   extensive                                                                        plaque/shadowing    +----------+--------+--------+--------+---------------------+------------------+ +----------+--------+--------+----------------+-------------------+             PSV cm/s EDV cm/s Describe         Arm Pressure (mmHG)  +----------+--------+--------+----------------+-------------------+  Subclavian 76                Multiphasic, WNL 130                  +----------+--------+--------+----------------+-------------------+ +---------+--------+--+--------+-+---------+  Vertebral PSV cm/s 47 EDV cm/s 8 Antegrade  +---------+--------+--+--------+-+---------+   Summary: Right Carotid: Velocities in the right ICA are consistent with a 1-39% stenosis.                Non-hemodynamically significant plaque <50% noted in the CCA. Left Carotid: Velocities in the left ICA are consistent with a 1-39% stenosis. Vertebrals:  Bilateral vertebral arteries demonstrate antegrade flow. Subclavians: Normal flow hemodynamics were seen in bilateral subclavian              arteries. *See table(s)  above for measurements and observations. Suggest follow up study in 12 months due to plaque burden. Electronically signed by Larae Grooms MD on 11/22/2021 at 5:08:49 PM.    Final     Microbiology: Recent Results (from the past 240 hour(s))  Resp Panel by RT-PCR (Flu A&B, Covid) Nasopharyngeal Swab     Status: None   Collection Time: 12/13/21 10:22 AM   Specimen: Nasopharyngeal Swab; Nasopharyngeal(NP) swabs in vial transport medium  Result Value Ref Range Status   SARS Coronavirus 2 by RT PCR NEGATIVE NEGATIVE Final    Comment: (NOTE) SARS-CoV-2 target nucleic acids are NOT DETECTED.  The SARS-CoV-2 RNA is generally detectable in upper respiratory specimens during the acute phase of infection. The lowest concentration of SARS-CoV-2 viral copies  this assay can detect is 138 copies/mL. A negative result does not preclude SARS-Cov-2 infection and should not be used as the sole basis for treatment or other patient management decisions. A negative result may occur with  improper specimen collection/handling, submission of specimen other than nasopharyngeal swab, presence of viral mutation(s) within the areas targeted by this assay, and inadequate number of viral copies(<138 copies/mL). A negative result must be combined with clinical observations, patient history, and epidemiological information. The expected result is Negative.  Fact Sheet for Patients:  EntrepreneurPulse.com.au  Fact Sheet for Healthcare Providers:  IncredibleEmployment.be  This test is no t yet approved or cleared by the [place] FDA and  has been authorized for detection and/or diagnosis of SARS-CoV-2 by FDA under an Emergency Use Authorization (EUA). This EUA will remain  in effect (meaning this test can be used) for the duration of the COVID-19 declaration under Section 564(b)(1) of the Act, 21 U.S.C.section 360bbb-3(b)(1), unless the authorization is terminated  or  revoked sooner.       Influenza A by PCR NEGATIVE NEGATIVE Final   Influenza B by PCR NEGATIVE NEGATIVE Final    Comment: (NOTE) The Xpert Xpress SARS-CoV-2/FLU/RSV plus assay is intended as an aid in the diagnosis of influenza from Nasopharyngeal swab specimens and should not be used as a sole basis for treatment. Nasal washings and aspirates are unacceptable for Xpert Xpress SARS-CoV-2/FLU/RSV testing.  Fact Sheet for Patients: EntrepreneurPulse.com.au  Fact Sheet for Healthcare Providers: IncredibleEmployment.be  This test is not yet approved or cleared by the [place] FDA and has been authorized for detection and/or diagnosis of SARS-CoV-2 by FDA under an Emergency Use Authorization (EUA). This EUA will remain in effect (meaning this test can be used) for the duration of the COVID-19 declaration under Section 564(b)(1) of the Act, 21 U.S.C. section 360bbb-3(b)(1), unless the authorization is terminated or revoked.  Performed at [facility], [place] [address], [place], [place] [address]   MRSA Next Gen by PCR, Nasal     Status: None   Collection Time: 12/14/21  7:21 PM   Specimen: Nasal Mucosa; Nasal Swab  Result Value Ref Range Status   MRSA by PCR Next Gen NOT DETECTED NOT DETECTED Final    Comment: (NOTE) The GeneXpert MRSA Assay (FDA approved for NASAL specimens only), is one component of a comprehensive MRSA colonization surveillance program. It is not intended to diagnose MRSA infection nor to guide or monitor treatment for MRSA infections. Test performance is not FDA approved in patients less than 60 years old. Performed at [facility], [place] [address] [place], [place] [address]      Labs: Basic Metabolic Panel: Recent Labs  Lab 12/14/21 0545 12/15/21 0548 12/16/21 0102 12/17/21 0043 12/18/21 0313  NA 141 141 137 133* 133*  K 4.2 4.2 3.7 3.9 4.0  CL 106 105 106 104 101  CO2 25 20* 23 21* 22   GLUCOSE 117* 88 178* 191* 165*  BUN 65* 50* 44* 39* 40*  CREATININE 1.56* 1.44* 1.24 1.22 1.21  CALCIUM 8.8* 8.7* 8.4* 8.3* 8.5*  MG 2.1  --   --   --   --   PHOS 5.0*  --   --   --   --    Liver Function Tests: Recent Labs  Lab 12/13/21 0726 12/14/21 0545 12/15/21 0548  AST 30 29 29   ALT 17 17 20   ALKPHOS 58 58 67  BILITOT 0.7 1.4* 1.7*  PROT 5.7*  5.3* 5.5*  ALBUMIN 3.1* 2.8* 2.8*   No results for input(s): LIPASE, AMYLASE in the last 168 hours. No results for input(s): AMMONIA in the last 168 hours. CBC: Recent Labs  Lab 12/13/21 0726 12/13/21 2029 12/14/21 0545 12/15/21 0548 12/16/21 0102 12/17/21 0043 12/18/21 0313  WBC 4.8   < > 5.5 7.9 7.3 6.3 5.9  NEUTROABS 3.2  --   --   --   --   --   --   HGB 6.5*   < > 8.7* 9.2* 8.7* 8.4* 8.9*  HCT 20.9*   < > 26.6* 29.5* 26.9* 26.1* 27.2*  MCV 99.1   < > 95.7 100.0 97.5 99.6 98.9  PLT 208   < > 204 215 181 187 190   < > = values in this interval not displayed.   Cardiac Enzymes: No results for input(s): CKTOTAL, CKMB, CKMBINDEX, TROPONINI in the last 168 hours. BNP: BNP (last 3 results) Recent Labs    12/13/21 1455  BNP 768.6*    ProBNP (last 3 results) Recent Labs    12/05/21 0915  PROBNP 6,343*    CBG: Recent Labs  Lab 12/17/21 1124 12/17/21 1640 12/17/21 2055 12/18/21 0645 12/18/21 1102  GLUCAP 155* 165* 182* 171* 272*       Signed:  Kayleen Memos, MD [facility] 12/18/2021, 11:37 AM

## 2021-12-19 ENCOUNTER — Encounter: Payer: Self-pay | Admitting: Adult Health

## 2021-12-19 ENCOUNTER — Non-Acute Institutional Stay (SKILLED_NURSING_FACILITY): Payer: Medicare Other | Admitting: Adult Health

## 2021-12-19 DIAGNOSIS — I5032 Chronic diastolic (congestive) heart failure: Secondary | ICD-10-CM | POA: Diagnosis not present

## 2021-12-19 DIAGNOSIS — N179 Acute kidney failure, unspecified: Secondary | ICD-10-CM

## 2021-12-19 DIAGNOSIS — K922 Gastrointestinal hemorrhage, unspecified: Secondary | ICD-10-CM | POA: Diagnosis not present

## 2021-12-19 DIAGNOSIS — R531 Weakness: Secondary | ICD-10-CM

## 2021-12-19 DIAGNOSIS — D62 Acute posthemorrhagic anemia: Secondary | ICD-10-CM

## 2021-12-19 DIAGNOSIS — I482 Chronic atrial fibrillation, unspecified: Secondary | ICD-10-CM

## 2021-12-19 DIAGNOSIS — N1832 Chronic kidney disease, stage 3b: Secondary | ICD-10-CM | POA: Diagnosis not present

## 2021-12-19 DIAGNOSIS — A048 Other specified bacterial intestinal infections: Secondary | ICD-10-CM | POA: Diagnosis not present

## 2021-12-19 DIAGNOSIS — N400 Enlarged prostate without lower urinary tract symptoms: Secondary | ICD-10-CM

## 2021-12-19 NOTE — Progress Notes (Signed)
Location:  [facility] Room Number: 371 Place of Service:  SNF (31) Provider:  Durenda Age, DNP, FNP-BC  Patient Care Team: Aaron Pepper, MD as PCP - General (Family Medicine) Aaron Lean, MD as PCP - Cardiology (Cardiology) Aaron Mocha, MD as Attending Physician (Cardiology) Aaron Isaac, MD (Inactive) as Attending Physician (Cardiothoracic Surgery) Aaron Alberts, MD (Inactive) as Attending Physician (Cardiothoracic Surgery)  Extended Emergency Contact Information Primary Emergency Contact: [name]          [place], [place] [address] [name] of [place] Phone: [phone] Mobile Phone: [phone] Relation: Daughter Secondary Emergency Contact: [name] Address: [address]          [place], [place] [id number] [name] of [place] Phone: [phone] Relation: Spouse  Code Status:  FULL CODE  Goals of care: Advanced Directive information Advanced Directives 12/19/2021  Does Patient Have a Medical Advance Directive? No  Type of Advance Directive -  [place] in Chart? -  Would patient like information on creating a medical advance directive? -  Pre-existing out of facility DNR order (yellow form or pink MOST form) -     Chief Complaint  Patient presents with   Hospitalization Follow-up    Hospital follow up    HPI:  Pt is a 88 y.o. male who was admitted to [facility] on 12/18/21 post hospital admission 12/13/21 to 12/18/21. He has a PMH of coronary artery calcification, severe aortic stenosis S/P TAVR, HFpEF, moderate mitral regurgitation, moderate tricuspid regurgitation, type 2 diabetes, essential hypertension, persistent atrial fibrillation not on anticoagulation, peripheral artery disease, chronic kidney disease stage IIIb, prior GI bleed with gastric ulcers and previous untreated H. pyloric infection.  He presented to [facility] ED with  complaints of generalized weakness and S/P fall night before presentation.  Work-up revealed hemoglobin of 6.5K and positive FOBT.  She had transfusion of 1 unit PRBC.  GI was consulted and had EGD on 12/14/2021 which showed multiple gastric ulcers.  GI recommended twice daily PPI x2 months followed by daily PPI indefinitely.  He had a history of H. pylori without documentation of being treated in the past.  He was started on H. pylori antibiotic therapy.  Hospitalization was complicated by A-fib with RVR and was restarted on a lower home dose Toprol XL to avoid hypotension.  GI bleed resolved and GI signed off on 12/17/2021.  He was seen in his room today. He denies having abdominal pain nor bloody stool. He stated that he feels weak and wanted to have therapy so he can discharge back home soon.   Past Medical History:  Diagnosis Date   Anemia    LOW PLATELETS OTHER DAY  PER PT   Anticoagulated on Coumadin 12/21/2012   Aortic stenosis    Arthritis    "left wrist; back sometimes" (01/21/2013)   Atrial fibrillation ([place])    [name], DR COOPER   Bradycardia    Exertional shortness of breath    Heart murmur    "I've had it for years; runs in the family on daddy's side" (01/21/2013)   HTN (hypertension)    Hyperlipemia 12/21/2012   Prostate cancer ([place])    "had 40 tx of radiation in 2009" (01/21/2013)   S/P aortic valve replacement with bioprosthetic valve 01/19/2013   Transcatheter Aortic Valve Replacement using 62m Sapien bioprosthetic tissue valve via transapical approach   Type II diabetes mellitus ([place]    Past Surgical History:  Procedure Laterality Date   BALLOON DILATION N/A 08/02/2020   Procedure: BALLOON DILATION;  Surgeon: Aaron Juniper, MD;  Location: [facility];  Service: Gastroenterology;  Laterality: N/A;   BIOPSY  10/03/2021   Procedure: BIOPSY;  Surgeon: Aaron [facility], MD;  Location: [facility];  Service: Endoscopy;;   BIOPSY  12/14/2021   Procedure: BIOPSY;  Surgeon:  Aaron Corner, MD;  Location: [facility];  Service: Endoscopy;;   CARDIAC CATHETERIZATION  12/16/2012   CARDIAC VALVE REPLACEMENT  01/19/2013   AVR   CATARACT EXTRACTION W/ INTRAOCULAR LENS  IMPLANT, BILATERAL     ESOPHAGOGASTRODUODENOSCOPY N/A 10/03/2021   Procedure: ESOPHAGOGASTRODUODENOSCOPY (EGD);  Surgeon: [facility], MD;  Location: [facility];  Service: Endoscopy;  Laterality: N/A;   ESOPHAGOGASTRODUODENOSCOPY N/A 12/14/2021   Procedure: ESOPHAGOGASTRODUODENOSCOPY (EGD);  Surgeon: Aaron Corner, MD;  Location: [facility];  Service: Endoscopy;  Laterality: N/A;   ESOPHAGOGASTRODUODENOSCOPY (EGD) WITH PROPOFOL N/A 08/02/2020   Procedure: ESOPHAGOGASTRODUODENOSCOPY (EGD) WITH PROPOFOL;  Surgeon: Aaron Juniper, MD;  Location: [facility];  Service: Gastroenterology;  Laterality: N/A;   EYE SURGERY     INTRAOPERATIVE TRANSESOPHAGEAL ECHOCARDIOGRAM N/A 01/19/2013   Procedure: INTRAOPERATIVE TRANSESOPHAGEAL ECHOCARDIOGRAM;  Surgeon: Aaron Alberts, MD;  Location: [facility];  Service: Open Heart Surgery;  Laterality: N/A;   ORIF FOREARM FRACTURE Left 1954   "compound fx" (01/21/2013)   TRANSTHORACIC ECHOCARDIOGRAM  09/04/10, 09/07/08    Allergies  Allergen Reactions   Amoxicillin Other (See Comments)    Burning of mouth, tongue and lips.  itchy, burning throat    Outpatient Encounter Medications as of 12/19/2021  Medication Sig   acetaminophen (TYLENOL) 650 MG CR tablet Take 650 mg by mouth daily with breakfast.   aspirin EC 81 MG tablet Take 1 tablet (81 mg total) by mouth daily. Swallow whole.   B Complex Vitamins (B COMPLEX 50) TABS Take 1 tablet by mouth daily with breakfast.   cholecalciferol (VITAMIN D3) 25 MCG (1000 UNIT) tablet Take 1,000 Units by mouth 2 (two) times daily with a meal.   clarithromycin (BIAXIN) 500 MG tablet Take 1 tablet (500 mg total) by mouth every 12 (twelve) hours for 13 days.   enzalutamide (XTANDI) 40 MG capsule Take 4 capsules (160 mg total) by  mouth daily at 2 PM.   ferrous sulfate 325 (65 FE) MG tablet Take 1 tablet (325 mg total) by mouth 2 (two) times daily with a meal.   gabapentin (NEURONTIN) 100 MG capsule Take 1 capsule (100 mg total) by mouth at bedtime.   metFORMIN (GLUCOPHAGE) 1000 MG tablet Take 1 tablet (1,000 mg total) by mouth 2 (two) times daily with breakfast and lunch.   metoprolol succinate (TOPROL-XL) 25 MG 24 hr tablet Take 0.5 tablets (12.5 mg total) by mouth daily.   metroNIDAZOLE (FLAGYL) 250 MG tablet Take 1 tablet (250 mg total) by mouth every 8 (eight) hours for 13 days.   Nystatin (GERHARDT'S BUTT CREAM) CREA Apply 1 application topically daily as needed for irritation.   pantoprazole (PROTONIX) 40 MG tablet Take 1 tablet (40 mg total) by mouth 2 (two) times daily.   [START ON 02/17/2022] pantoprazole (PROTONIX) 40 MG tablet Take 1 tablet (40 mg total) by mouth daily with supper.   PSYLLIUM PO Take 1 capsule by mouth daily.   rosuvastatin (CRESTOR) 5 MG tablet TAKE 1 TABLET BY MOUTH ONCE DAILY AT  6PM   Vitamin A 2400 MCG (8000 UT) TABS Take 2,400 mcg by mouth daily with breakfast.   vitamin B-12 (CYANOCOBALAMIN) 1000  MCG tablet Take 1,000 mcg by mouth 2 (two) times daily with a meal.   vitamin C (ASCORBIC ACID) 500 MG tablet Take 500 mg by mouth 2 (two) times daily with a meal.   zinc gluconate 50 MG tablet Take 50 mg by mouth daily with breakfast.   [DISCONTINUED] ACCU-CHEK SOFTCLIX LANCETS lancets daily. use as directed   [DISCONTINUED] ACCU-CHEK AVIVA PLUS test strip    [DISCONTINUED] Selenium 200 MCG CAPS Take 200 mcg by mouth daily with breakfast.   No facility-administered encounter medications on file as of 12/19/2021.    Review of Systems  Constitutional:  Negative for activity change, appetite change and fever.  HENT:  Negative for sore throat.   Eyes: Negative.   Cardiovascular:  Negative for chest pain and leg swelling.  Gastrointestinal:  Negative for abdominal distention, diarrhea and  vomiting.  Genitourinary:  Negative for dysuria, frequency and urgency.  Skin:  Negative for color change.  Neurological:  Negative for dizziness and headaches.  Psychiatric/Behavioral:  Negative for behavioral problems and sleep disturbance. The patient is not nervous/anxious.       Immunization History  Administered Date(s) Administered   Influenza,inj,Quad PF,6+ Mos 07/15/2016, 08/01/2017, 07/24/2018, 08/27/2019, 10/05/2020, 07/12/2021   Influenza,inj,quad, With Preservative 07/21/2014, 08/14/2015   Influenza-Unspecified 07/05/2012   Janssen (J&J) SARS-COV-2 Vaccination 02/13/2020, 09/19/2020   Pneumococcal Conjugate PCV 7 08/04/2017   Pneumococcal Conjugate-13 04/28/2014   Pneumococcal Polysaccharide-23 09/03/2010   Tdap 02/05/2016   Pertinent  Health Maintenance Due  Topic Date Due   OPHTHALMOLOGY EXAM  Never done   URINE MICROALBUMIN  Never done   FOOT EXAM  03/09/2022   HEMOGLOBIN A1C  06/14/2022   INFLUENZA VACCINE  Completed   Fall Risk 12/16/2021 12/17/2021 12/17/2021 12/17/2021 12/18/2021  Falls in the past year? - - - - -  Was there an injury with Fall? - - - - -  Fall Risk Category Calculator - - - - -  Fall Risk Category - - - - -  Patient Fall Risk Level _0      Vitals:   12/19/21 1136  BP: 123/68  Pulse: 92  Resp: 20  Temp: (!) 97.3 F (36.3 C)  SpO2: 95%  Weight: 167 lb 9.6 oz (76 kg)  Height: 5' 8.5" (1.74 m)   Body mass index is 25.11 kg/m.  Physical Exam Constitutional:      Appearance: Normal appearance.  HENT:     Head: Normocephalic and atraumatic.     Mouth/Throat:     Mouth: Mucous membranes are moist.  Eyes:     Conjunctiva/sclera: Conjunctivae normal.  Cardiovascular:     Rate and Rhythm: Normal rate. Rhythm irregular.     Pulses: Normal pulses.     Heart sounds: Normal heart sounds.  Pulmonary:     Effort: Pulmonary effort is normal.     Breath sounds: Normal  breath sounds.  Abdominal:     General: Bowel sounds are normal.     Palpations: Abdomen is soft.  Musculoskeletal:        General: No swelling. Normal range of motion.     Cervical back: Normal range of motion.  Skin:    General: Skin is warm and dry.     Comments: Multiple abrasions on bilateral elbows and Left lower leg, bruise on forehead and scalp  Neurological:     General: No focal deficit present.     Mental Status: He  is alert and oriented to person, place, and time.  Psychiatric:        Mood and Affect: Mood normal.        Behavior: Behavior normal.        Thought Content: Thought content normal.        Judgment: Judgment normal.       Labs reviewed: Recent Labs    04/04/21 0918 10/01/21 2019 10/06/21 1041 10/19/21 0000 12/14/21 0545 12/15/21 0548 12/16/21 0102 12/17/21 0043 12/18/21 0313  NA 143   < > 132*   < > 141   < > 137 133* 133*  K 5.2   < > 3.4*   < > 4.2   < > 3.7 3.9 4.0  CL 101   < > 106   < > 106   < > 106 104 101  CO2 25   < > 19*   < > 25   < > 23 21* 22  GLUCOSE 195*   < > 324*   < > 117*   < > 178* 191* 165*  BUN 46*   < > 38*   < > 65*   < > 44* 39* 40*  CREATININE 1.38*   < > 1.07   < > 1.56*   < > 1.24 1.22 1.21  CALCIUM 9.7   < > 8.0*   < > 8.8*   < > 8.4* 8.3* 8.5*  MG 2.3  --  2.2  --  2.1  --   --   --   --   PHOS  --   --   --   --  5.0*  --   --   --   --    < > = values in this interval not displayed.   Recent Labs    12/13/21 0726 12/14/21 0545 12/15/21 0548  AST _0 ALT _1 ALKPHOS 58 58 67  BILITOT 0.7 1.4* 1.7*  PROT 5.7* 5.3* 5.5*  ALBUMIN 3.1* 2.8* 2.8*   Recent Labs    10/02/21 0613 10/03/21 0514 12/13/21 0726 12/13/21 2029 12/16/21 0102 12/17/21 0043 12/18/21 0313  WBC 7.3   < > 4.8   < > 7.3 6.3 5.9  NEUTROABS 5.6  --  3.2  --   --   --   --   HGB 7.6*   < > 6.5*   < > 8.7* 8.4* 8.9*  HCT 23.2*   < > 20.9*   < > 26.9* 26.1* 27.2*  MCV 97.5   < > 99.1   < > 97.5 99.6 98.9  PLT 214   < >  208   < > 181 187 190   < > = values in this interval not displayed.   Lab Results  Component Value Date   TSH 1.653 12/13/2021   Lab Results  Component Value Date   HGBA1C 5.4 12/15/2021   Lab Results  Component Value Date   CHOL 81 (L) 08/17/2018   HDL 28 (L) 08/17/2018   LDLCALC 37 08/17/2018   TRIG 82 08/17/2018   CHOLHDL 2.9 08/17/2018    Significant Diagnostic Results in last 30 days:  CT HEAD WO CONTRAST (5MM)  Result Date: 12/13/2021 CLINICAL DATA:  Head trauma, moderate-severe EXAM: CT HEAD WITHOUT CONTRAST TECHNIQUE: Contiguous axial images were obtained from the base of the skull through the vertex without intravenous contrast. RADIATION DOSE REDUCTION: This exam was performed according to the departmental dose-optimization program  which includes automated exposure control, adjustment of the mA and/or kV according to patient size and/or use of iterative reconstruction technique. COMPARISON:  10/01/2021 FINDINGS: Brain: There is no acute intracranial hemorrhage, significant mass effect, or edema. Gray-white differentiation is preserved. There is no extra-axial fluid collection. Prominence of the ventricles and sulci reflects similar parenchymal volume loss. Patchy low-density in the supratentorial white matter is nonspecific but may reflect stable chronic microvascular ischemic changes. Similar prominence of the extra-axial space along the right cerebral convexity that could reflect a thin subdural hygroma. Vascular: There is atherosclerotic calcification at the skull base. Skull: Calvarium is unremarkable. Sinuses/Orbits: No acute finding. Other: None. IMPRESSION: No evidence of acute intracranial injury. 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 Electronically Signed   By: Macy Mis M.D.   On: 12/13/2021 08:26   CT Cervical Spine Wo Contrast  Result Date: 12/13/2021 CLINICAL DATA:  Neck trauma (Age >= 65y) EXAM: CT CERVICAL SPINE WITHOUT CONTRAST TECHNIQUE:  Multidetector CT imaging of the cervical spine was performed without intravenous contrast. Multiplanar CT image reconstructions were also generated. RADIATION DOSE REDUCTION: This exam was performed according to the departmental dose-optimization program which includes automated exposure control, adjustment of the mA and/or kV according to patient size and/or use of iterative reconstruction technique. COMPARISON:  None. FINDINGS: Alignment: Mild degenerative listhesis. Skull base and vertebrae: Degenerative endplate irregularity. No acute fracture. Soft tissues and spinal canal: No prevertebral fluid or swelling. No visible canal hematoma. Disc levels: Multilevel degenerative changes are present including disc space narrowing, endplate osteophytes, and facet and uncovertebral hypertrophy. Upper chest: No apical lung mass. Other: Atherosclerosis including significant calcification at the common carotid bifurcations. IMPRESSION: No acute cervical spine fracture. Electronically Signed   By: Macy Mis M.D.   On: 12/13/2021 08:33   DG Chest Portable 1 View  Result Date: 12/13/2021 CLINICAL DATA:  Shortness of breath in an 88 year old male. EXAM: PORTABLE CHEST 1 VIEW COMPARISON:  October 01, 2021. FINDINGS: EKG leads project over the chest. Trachea is midline. Cardiomediastinal contours mild cardiac enlargement unchanged compared to recent imaging. Mild interstitial prominence is noted without frank signs of pulmonary edema. No lobar consolidation or sign of pleural fluid. No pneumothorax. On limited assessment there is no acute skeletal process. Marked LEFT glenohumeral degenerative changes are noted. IMPRESSION: Stable mild cardiac enlargement. Mild interstitial prominence slightly asymmetric could reflect changes of early CHF or atypical infection. Electronically Signed   By: Zetta Bills M.D.   On: 12/13/2021 11:50   ECHOCARDIOGRAM COMPLETE  Result Date: 11/21/2021    ECHOCARDIOGRAM REPORT   Patient  Name:   [name] Date of Exam: 11/21/2021 Medical Rec #:  [id number]         Height:       68.5 in Accession #:    [id number]        Weight:       171.6 lb Date of Birth:  10/28/1933          BSA:          1.925 m Patient Age:    49 years          BP:           110/58 mmHg Patient Gender: M                 HR:           91 bpm. Exam Location:  [facility] Procedure: 2D Echo, 3D Echo, Cardiac Doppler and Color Doppler  Indications:    I50.32 Heart Failure with Preserved Ejection Fraction  History:        Patient has prior history of Echocardiogram examinations, most                 recent 08/18/2020. Pulmonary HTN, Aortic Valve Disease and                 Mitral Valve Disease, Arrythmias:Atrial Fibrillation,                 Signs/Symptoms:Edema; Risk Factors:Hypertension and                 Dyslipidemia. Severe Aortic Stenosis status post TAVR (2014,                 36m Edwards Sapien TAVR Valve).  Sonographer:    BDeliah BostonRDCS Referring Phys: [facility] CHANDRASEKHAR IMPRESSIONS  1. Left ventricular ejection fraction, by estimation, is 60 to 65%. The left ventricle has normal function. The left ventricle has no regional wall motion abnormalities. Left ventricular diastolic parameters are indeterminate.  2. Mildly D-shaped septum suggestive of RV pressure/volume overload. Right ventricular systolic function is mildly reduced. The right ventricular size is mildly enlarged. There is severely elevated pulmonary artery systolic pressure. The estimated right  ventricular systolic pressure is 797.6mmHg.  3. Left atrial size was severely dilated.  4. Right atrial size was severely dilated.  5. The mitral valve is degenerative. Probably severe mitral valve regurgitation, PISA ERO 0.41 cm^2. No evidence of mitral stenosis.  6. The tricuspid valve is abnormal. Tricuspid valve regurgitation is severe.  7. Bioprosthetic aortic valve s/p TAVR with 26 mm Edwards Sapien THV. Trivial perivalvular leakage. Mean gradient  9 mmHg, DI 0.38.  8. Aortic dilatation noted. There is mild dilatation of the ascending aorta, measuring 36 mm.  9. The inferior vena cava is dilated in size with <50% respiratory variability, suggesting right atrial pressure of 15 mmHg. FINDINGS  Left Ventricle: Left ventricular ejection fraction, by estimation, is 60 to 65%. The left ventricle has normal function. The left ventricle has no regional wall motion abnormalities. The left ventricular internal cavity size was normal in size. There is  no left ventricular hypertrophy. Left ventricular diastolic parameters are indeterminate. Right Ventricle: Mildly D-shaped septum suggestive of RV pressure/volume overload. The right ventricular size is mildly enlarged. No increase in right ventricular wall thickness. Right ventricular systolic function is mildly reduced. There is severely elevated pulmonary artery systolic pressure. The tricuspid regurgitant velocity is 3.71 m/s, and with an assumed right atrial pressure of 15 mmHg, the estimated right ventricular systolic pressure is 773.4mmHg. Left Atrium: Left atrial size was severely dilated. Right Atrium: Right atrial size was severely dilated. Pericardium: There is no evidence of pericardial effusion. Mitral Valve: The mitral valve is degenerative in appearance. There is moderate calcification of the mitral valve leaflet(s). Mild mitral annular calcification. Severe mitral valve regurgitation. No evidence of mitral valve stenosis. MV peak gradient, 8.0 mmHg. The mean mitral valve gradient is 2.0 mmHg. Tricuspid Valve: The tricuspid valve is abnormal. Tricuspid valve regurgitation is severe. Aortic Valve: Bioprosthetic aortic valve s/p TAVR with 26 mm Edwards Sapien THV. Trivial perivalvular leakage. Mean gradient 9 mmHg, DI 0.38. The aortic valve has been repaired/replaced. Aortic valve regurgitation is trivial. Aortic regurgitation PHT measures 289 msec. Aortic valve mean gradient measures 8.0 mmHg. Aortic valve  peak gradient measures 17.2 mmHg. Aortic valve area, by VTI measures 1.33 cm. Pulmonic Valve: The pulmonic  valve was normal in structure. Pulmonic valve regurgitation is trivial. Aorta: Aortic dilatation noted. There is mild dilatation of the ascending aorta, measuring 36 mm. Venous: The inferior vena cava is dilated in size with less than 50% respiratory variability, suggesting right atrial pressure of 15 mmHg. IAS/Shunts: No atrial level shunt detected by color flow Doppler.  LEFT VENTRICLE PLAX 2D LVIDd:         4.50 cm   Diastology LVIDs:         2.80 cm   LV e' medial:    6.92 cm/s LV PW:         1.10 cm   LV E/e' medial:  18.9 LV IVS:        1.00 cm   LV e' lateral:   10.90 cm/s LVOT diam:     2.20 cm   LV E/e' lateral: 12.0 LV SV:         52 LV SV Index:   27 LVOT Area:     3.80 cm                           3D Volume EF:                          3D EF:        59 %                          LV EDV:       117 ml                          LV ESV:       48 ml                          LV SV:        69 ml RIGHT VENTRICLE RV S prime:     8.58 cm/s TAPSE (M-mode): 0.8 cm LEFT ATRIUM              Index        RIGHT ATRIUM           Index LA diam:        4.90 cm  2.54 cm/m   RA Area:     38.70 cm LA Vol (A2C):   133.0 ml 69.08 ml/m  RA Volume:   152.00 ml 78.95 ml/m LA Vol (A4C):   123.0 ml 63.88 ml/m LA Biplane Vol: 130.0 ml 67.52 ml/m  AORTIC VALVE AV Area (Vmax):    1.32 cm AV Area (Vmean):   1.34 cm AV Area (VTI):     1.33 cm AV Vmax:           207.20 cm/s AV Vmean:          126.600 cm/s AV VTI:            0.392 m AV Peak Grad:      17.2 mmHg AV Mean Grad:      8.0 mmHg LVOT Vmax:         71.98 cm/s LVOT Vmean:        44.760 cm/s LVOT VTI:          0.137 m LVOT/AV VTI ratio: 0.35 AI PHT:            289 msec  AORTA Ao Root diam: 3.30 cm Ao Asc diam:  3.45 cm MITRAL VALVE                  TRICUSPID VALVE MV Area (PHT): cm            TR Peak grad:   55.1 mmHg MV Area VTI:   1.43 cm       TR Vmax:         371.00 cm/s MV Peak grad:  8.0 mmHg MV Mean grad:  2.0 mmHg       SHUNTS MV Vmax:       1.41 m/s       Systemic VTI:  0.14 m MV Vmean:      59.7 cm/s      Systemic Diam: 2.20 cm MV Decel Time: 208 msec MR Peak grad:    90.8 mmHg MR Mean grad:    54.0 mmHg MR Vmax:         476.33 cm/s MR Vmean:        340.3 cm/s MR PISA:         6.28 cm MR PISA Eff ROA: 41 mm MR PISA Radius:  1.00 cm MV E velocity: 130.75 cm/s MV A velocity: 41.30 cm/s MV E/A ratio:  3.17 Dalton McleanMD Electronically signed by Franki Monte Signature Date/Time: 11/21/2021/3:19:27 PM    Final    VAS US CAROTID  Result Date: 11/22/2021 Carotid Arterial Duplex Study Patient Name:  SOTERO BRINKMEYER  Date of Exam:   11/22/2021 Medical Rec #: [id number]          Accession #:    [id number] Date of Birth: 1933/03/23           Patient Gender: M Patient Age:   47 years Exam Location:  [facility] Procedure:      VAS US CAROTID Referring Phys: Roderic Palau BERRY --------------------------------------------------------------------------------  Indications:       Carotid artery disease and Patient denies any cerebrovascular                    symptoms. Risk Factors:      Hypertension, Diabetes, past history of smoking. Comparison Study:  Prior carotid 11/22/2020, RICA 010/27 cm/s and LICA                    114/24cm/s. Performing Technologist: Leavy Cella RDCS Supporting Technologist: Mariane Masters RVT  Examination Guidelines: A complete evaluation includes B-mode imaging, spectral Doppler, color Doppler, and power Doppler as needed of all accessible portions of each vessel. Bilateral testing is considered an integral part of a complete examination. Limited examinations for reoccurring indications may be performed as noted.  Right Carotid Findings: +----------+--------+--------+--------+------------------+---------------------+             PSV cm/s EDV cm/s Stenosis Plaque Description Comments                +----------+--------+--------+--------+------------------+---------------------+  CCA Prox   90       8                 heterogenous                              +----------+--------+--------+--------+------------------+---------------------+  CCA Mid    90       14                heterogenous                              +----------+--------+--------+--------+------------------+---------------------+  CCA Distal 61       12                                                          +----------+--------+--------+--------+------------------+---------------------+  ICA Prox   144      29       1-39%    heterogenous       Unable to reproduce                                                              velocities obtained                                                              in prior exam.         +----------+--------+--------+--------+------------------+---------------------+  ICA Mid    145      27                                                          +----------+--------+--------+--------+------------------+---------------------+  ICA Distal 112      35                                                          +----------+--------+--------+--------+------------------+---------------------+  ECA        158      10                heterogenous                              +----------+--------+--------+--------+------------------+---------------------+ +----------+--------+-------+----------------+-------------------+             PSV cm/s EDV cms Describe         Arm Pressure (mmHG)  +----------+--------+-------+----------------+-------------------+  Subclavian 138              Multiphasic, WNL 125                  +----------+--------+-------+----------------+-------------------+ +---------+--------+--+--------+-+---------+  Vertebral PSV cm/s 48 EDV cm/s 6 Antegrade  +---------+--------+--+--------+-+---------+  Left Carotid Findings:  +----------+--------+--------+--------+---------------------+------------------+             PSV cm/s EDV cm/s Stenosis Plaque Description    Comments            +----------+--------+--------+--------+---------------------+------------------+  CCA Prox   68       7                                                           +----------+--------+--------+--------+---------------------+------------------+  CCA Mid    58       13                                                          +----------+--------+--------+--------+---------------------+------------------+  CCA Distal 65       16                heterogenous                              +----------+--------+--------+--------+---------------------+------------------+  ICA Prox   50       12       1-39%    irregular, calcific                                                              and diffuse                               +----------+--------+--------+--------+---------------------+------------------+  ICA Mid    71       17                                                          +----------+--------+--------+--------+---------------------+------------------+  ICA Distal 48       6                                                           +----------+--------+--------+--------+---------------------+------------------+  ECA        57       7                 irregular, diffuse    Shadowing, unable                                          and calcific          to obtain optimal                                                                velocities due  extensive                                                                        plaque/shadowing    +----------+--------+--------+--------+---------------------+------------------+ +----------+--------+--------+----------------+-------------------+             PSV cm/s EDV cm/s Describe         Arm Pressure (mmHG)   +----------+--------+--------+----------------+-------------------+  Subclavian 76                Multiphasic, WNL 130                  +----------+--------+--------+----------------+-------------------+ +---------+--------+--+--------+-+---------+  Vertebral PSV cm/s 47 EDV cm/s 8 Antegrade  +---------+--------+--+--------+-+---------+   Summary: Right Carotid: Velocities in the right ICA are consistent with a 1-39% stenosis.                Non-hemodynamically significant plaque <50% noted in the CCA. Left Carotid: Velocities in the left ICA are consistent with a 1-39% stenosis. Vertebrals:  Bilateral vertebral arteries demonstrate antegrade flow. Subclavians: Normal flow hemodynamics were seen in bilateral subclavian              arteries. *See table(s) above for measurements and observations. Suggest follow up study in 12 months due to plaque burden. Electronically signed by Larae Grooms MD on 11/22/2021 at 5:08:49 PM.    Final     Assessment/Plan  1. Generalized weakness -   Due to multifactorial etiologies, AKI, anemia and acute illness -   S/P transfusion of 1 unit PRBC -   Pantoprazole 40 mg twice daily then daily indefinitely per GI recommendation -   PT and OT, for therapeutic strengthening exercises  2. Acute GI bleeding -   GI was consulted and performed EGD on 12/14/2021, revealing multiple gastric ulcers -   GI recommended pantoprazole 40 mg twice daily x2 months then daily indefinitely -   Follow-up with gastroenterology, Dr. Wilford [name], in 1 week  3. Chronic atrial fibrillation with RVR (HCC) -   restarted on a lower home dose Toprol XL to avoid hypotension -   Continue Toprol-XL 25 mg 1/2 tab = 12.5 daily -   Follow-up with cardiologist in 1 to 2 weeks -    Not on any anticoagulation  4. Acute blood loss anemia -    S/P transfusion of 1 unit PRBC -   Iron studies done on 10/04/2021 showed severe iron deficiency so he was given IV iron, Feraheme on 12/15/2021 -     Continue ferrous sulfate 325 mg 1 tab twice a day  5. H. pylori infection -   Was a started on clarithromycin 500 mg twice daily, Flagyl 50 mg 3 times a day with pantoprazole twice a day for a total of 14 days  6. Acute renal failure superimposed on stage 3b chronic kidney disease, unspecified acute renal failure type [facility]) Lab Results  Component Value Date   NA 133 (L) 12/18/2021   K 4.0 12/18/2021   CO2 22 12/18/2021   GLUCOSE 165 (H) 12/18/2021   BUN 40 (H) 12/18/2021   CREATININE 1.21 12/18/2021   CALCIUM 8.5 (L) 12/18/2021   EGFR 45 (L) 12/05/2021   GFRNONAA 58 (L) 12/18/2021   -   Back to baseline -  Avoid nephrotoxic agents and hypotension  7. Chronic diastolic heart failure (HCC) -   Last 2D echo done on 11/11/2021 revealed LVEF 60 to 65%  -   Continue daily weights -    ACE inhibitors were held due to low BPs -    Toprol-XL was restarted on 12/15/2021  8. Benign prostatic hyperplasia without lower urinary tract symptoms -   Continue Xtandi 40 mg give 4 capsules p.o. at 2 PM    Family/ staff Communication: Discussed plan of care with resident and charge nurse.  Labs/tests ordered:   CBC and BMP with GFR on 12/24/2021    Durenda Age, DNP, MSN, FNP-BC [facility] [phone] (Monday-Friday 8:00 a.m. - 5:00 p.m.) [phone] (after hours)

## 2021-12-20 ENCOUNTER — Non-Acute Institutional Stay (SKILLED_NURSING_FACILITY): Payer: Medicare Other | Admitting: Internal Medicine

## 2021-12-20 ENCOUNTER — Encounter: Payer: Self-pay | Admitting: Internal Medicine

## 2021-12-20 DIAGNOSIS — E440 Moderate protein-calorie malnutrition: Secondary | ICD-10-CM | POA: Diagnosis not present

## 2021-12-20 DIAGNOSIS — I482 Chronic atrial fibrillation, unspecified: Secondary | ICD-10-CM | POA: Diagnosis not present

## 2021-12-20 DIAGNOSIS — R296 Repeated falls: Secondary | ICD-10-CM

## 2021-12-20 DIAGNOSIS — R238 Other skin changes: Secondary | ICD-10-CM | POA: Diagnosis not present

## 2021-12-20 DIAGNOSIS — K922 Gastrointestinal hemorrhage, unspecified: Secondary | ICD-10-CM | POA: Diagnosis not present

## 2021-12-20 NOTE — Assessment & Plan Note (Signed)
Total protein 5.5 albumin 2.8 compatible with moderate-severe protein/caloric malnutrition.  He has bullae over the lower calf which are leaking clear fluid.  Protein supplementation will be ordered.

## 2021-12-20 NOTE — Assessment & Plan Note (Addendum)
Large weeping bullae over the posterior calves.  Apparently legs were being wrapped at home by home health nurse with benefit.  Wound care nurse at the SNF to consult.  The weeping bullae are in the context of moderate-severe protein/caloric malnutrition for which protein supplementation will be ordered.

## 2021-12-20 NOTE — Assessment & Plan Note (Signed)
PT/OT at SNF as tolerated. ?

## 2021-12-20 NOTE — Assessment & Plan Note (Signed)
Rate control paramount.  Beta-blocker will be titrated against blood pressure.

## 2021-12-20 NOTE — Patient Instructions (Signed)
See assessment and plan under each diagnosis in the problem list and acutely for this visit 

## 2021-12-20 NOTE — Progress Notes (Signed)
NURSING HOME LOCATION:  [facility] ROOM NUMBER:  [id number]  CODE STATUS:  Full Code  PCP: [name], MD  This is a comprehensive admission note to this SNFperformed on this date less than 30 days from date of admission. Included are preadmission medical/surgical history; reconciled medication list; family history; social history and comprehensive review of systems.  Corrections and additions to the records were documented. Comprehensive physical exam was also performed. Additionally a clinical summary was entered for each active diagnosis pertinent to this admission in the Problem List to enhance continuity of care.  HPI: He was hospitalized 2/9 - 12/18/2021 presenting to the ED with complaints of generalized weakness resulting in fall the night PTA.  Hemoglobin of 6.5 was documented with positive FOBT.  He received 1 unit of packed red cells.  EGD was performed by Dr. Michail Sermon, GI on 2/10 revealing multiple gastric ulcers.  PPI was recommended twice daily for 2 months followed by daily PPI indefinitely.  Clarithromycin 500 mg twice daily & Flagyl to 50 mg 3 times daily were initiated x14 days for H. pylori . Prior iron panel 10/04/2021 had revealed severe iron deficiency.  IV iron was administered as Feraheme on 2/11.  Oral iron supplement was continued.  At discharge H/H 8.9/27.2 with normal chromic, normocytic indices. Hospital course was complicated by A-fib with RVR.  Toprol-XL was reinitiated but the maintenance dose was decreased to avoid hypotension.  Cardura prescribed for BPH was held to prevent hypotension. Course was also complicated by AKI; baseline creatinine felt to be 1.2 with a GFR of 47.  At presentation creatinine was 1.98 with a GFR 32.  At discharge creatinine 1.21 and GFR 45 indicating borderline CKD stage IIIA/B. Total protein 5.5 and albumin 2.8 compatible with moderate protein/caloric malnutrition. Because of generalized weakness and debilitation  with a history of recurrent falling at home; discharge to SNF for PT/OT was pursued.  Past medical and surgical history: Includes dyslipidemia, GERD, essential hypertension, B12 deficiency, atrial fibrillation, history of prostate cancer, and diabetes with vascular disease. Surgeries and procedures include AVR, history of diastolic congestive heart failure, EGD, esophageal dilation, and ORIF forearm fracture.  Social history: Nondrinker; former smoker with a 44-pack-year consumption history..  Family history: Noncontributory due to advanced age.   Review of systems: He is an excellent historian.  He stated that he was hospitalized with bleeding ulcers for which he needed "2 pints of blood".  He stated that the anemia was associated with dizziness and led to him falling coming from his kitchen.  He apparently struck his head on the refrigerator but did not lose consciousness.  He is on low-dose prophylactic aspirin but denies any other potential triggers for gastritis/ulcers such as caffeine, smoking, alcohol, or NSAIDs ingestion.  He does describe occasional dysphagia.  He has some black stool which he relates to iron supplements.  Despite his GI issues; he is gaining weight.  He states that his "butt burns" which he relates to having had 40 radiation treatments for prostate cancer.  He is using a salve topically which helps.  He also describes occasional loose stools to "pudding" consistency stool. He describes numbness of the left index and third fingers in the context of significant edema of that hand.  Constitutional: No fever  Eyes: No redness, discharge, pain, vision change ENT/mouth: No nasal congestion, purulent discharge, earache, change in hearing, sore throat  Cardiovascular: No chest pain, palpitations, paroxysmal nocturnal dyspnea, claudication Respiratory: No cough, sputum production,  hemoptysis, significant snoring, apnea  Gastrointestinal: No abdominal pain, nausea /vomiting, rectal  bleeding Genitourinary: No dysuria, hematuria, pyuria, incontinence, nocturia Musculoskeletal: No joint stiffness, joint swelling, weakness, pain Dermatologic: No rash, pruritus Neurologic: No headache, syncope, seizures Psychiatric: No significant anxiety, depression, insomnia Endocrine: No change in hair/skin/nails, excessive thirst, excessive hunger, excessive urination  Hematologic/lymphatic: No significant lymphadenopathy Allergy/immunology: No itchy/watery eyes, significant sneezing, urticaria, angioedema  Physical exam:  Pertinent or positive findings: He appears his age and chronically ill.  Pseudotorticollis is suggested as he keeps his neck flexed with some deviation to the left.  Hair is disheveled.  Eyebrows are decreased laterally.  There is the appearance of resolving eschar over a skull deficit over the lateral aspect of the crown on the left.  He exhibits slight exotropia of the right eye.  Heart rhythm is irregular and there is slight increase in the heart rate.  Lungs are clear.  Abdomen is protuberant.  Pedal pulses are decreased.  He has dark stasis hyperpigmentation changes of the shins with marked exfoliative changes on the right.  There are 2 dressings over the left inferior shin and 1 over the right medial ankle.  He has bullae which are leaking clear fluid from the posterior calves.  He states that his lower extremities were being "bound up" by home health nurse weekly with improvement in the swelling.  He also exhibits irregular bruising over the forearms.  There is marked edema of the left forearm and hand.  There is marked decreased elevation and range of motion of the left upper extremity.  The right upper and right lower extremities are stronger than the left.  General appearance: no acute distress, increased work of breathing is present.   Lymphatic: No lymphadenopathy about the head, neck, axilla. Eyes: No conjunctival inflammation or lid edema is present. There is no  scleral icterus. Ears:  External ear exam shows no significant lesions or deformities.   Nose:  External nasal examination shows no deformity or inflammation. Nasal mucosa are pink and moist without lesions, exudates Oral exam: Lips and gums are healthy appearing.There is no oropharyngeal erythema or exudate. Neck:  No thyromegaly, masses, tenderness noted.    Heart:  No gallop, murmur, click, rub.  Lungs:  without wheezes, rhonchi, rales, rubs. Abdomen: Bowel sounds are normal.  Abdomen is soft and nontender with no organomegaly, hernias, masses. GU: Deferred  Extremities:  No cyanosis, clubbing Neurologic exam:  Balance, Rhomberg, finger to nose testing could not be completed due to clinical state  See clinical summary under each active problem in the Problem List with associated updated therapeutic plan

## 2021-12-20 NOTE — Assessment & Plan Note (Addendum)
No bleeding dyscrasias reported @ SNF.

## 2021-12-28 ENCOUNTER — Encounter: Payer: Self-pay | Admitting: Adult Health

## 2021-12-28 ENCOUNTER — Non-Acute Institutional Stay (SKILLED_NURSING_FACILITY): Payer: Medicare Other | Admitting: Adult Health

## 2021-12-28 DIAGNOSIS — R443 Hallucinations, unspecified: Secondary | ICD-10-CM | POA: Diagnosis not present

## 2021-12-28 DIAGNOSIS — I482 Chronic atrial fibrillation, unspecified: Secondary | ICD-10-CM | POA: Diagnosis not present

## 2021-12-28 DIAGNOSIS — K922 Gastrointestinal hemorrhage, unspecified: Secondary | ICD-10-CM | POA: Diagnosis not present

## 2021-12-28 DIAGNOSIS — D62 Acute posthemorrhagic anemia: Secondary | ICD-10-CM | POA: Diagnosis not present

## 2021-12-28 DIAGNOSIS — A048 Other specified bacterial intestinal infections: Secondary | ICD-10-CM | POA: Diagnosis not present

## 2021-12-28 LAB — BASIC METABOLIC PANEL
BUN: 55 — AB (ref 4–21)
CO2: 20 (ref 13–22)
Chloride: 105 (ref 99–108)
Creatinine: 1.5 — AB (ref 0.6–1.3)
Glucose: 224
Potassium: 5.5 — AB (ref 3.4–5.3)
Sodium: 135 — AB (ref 137–147)

## 2021-12-28 LAB — CBC AND DIFFERENTIAL
HCT: 33 — AB (ref 41–53)
Hemoglobin: 10.9 — AB (ref 13.5–17.5)
Neutrophils Absolute: 4
Platelets: 224 (ref 150–399)
WBC: 5.6

## 2021-12-28 LAB — COMPREHENSIVE METABOLIC PANEL: Calcium: 8.2 — AB (ref 8.7–10.7)

## 2021-12-28 LAB — CBC: RBC: 3.31 — AB (ref 3.87–5.11)

## 2021-12-28 NOTE — Progress Notes (Signed)
Location:  [facility] Room Number: 564-P Place of Service:  SNF (31) Provider:  Durenda Age, DNP, FNP-BC  Patient Care Team: [name], MD as PCP - General (Family Medicine) [name], MD as PCP - Cardiology (Cardiology) Sherren Mocha, MD as Attending Physician (Cardiology) [name], MD (Inactive) as Attending Physician (Cardiothoracic Surgery) [name], MD (Inactive) as Attending Physician (Cardiothoracic Surgery)  Extended Emergency Contact Information Primary Emergency Contact: [place]          [place], [place] [address] [name] Phone: [phone] Mobile Phone: [phone] Relation: Daughter Secondary Emergency Contact: Tennis Must Address: [address]          [place], [place] [address] [name] Phone: [phone] Relation: Spouse  Code Status:  Full Code  Goals of care: Advanced Directive information Advanced Directives 12/28/2021  Does Patient Have a Medical Advance Directive? No  Type of Advance Directive -  Copy of [place] in Chart? -  Would patient like information on creating a medical advance directive? No - Patient declined  Pre-existing out of facility DNR order (yellow form or pink MOST form) -     Chief Complaint  Patient presents with   Acute Visit    Short term care visit    HPI:   Pt is a 88 y.o. [name] seen today for an acute visit. Daughter stated that patient was hallucinating like he was trying to reach out for water on the side table while there is none. Daughter stated that that he acted same way when he was having bleeding ulcers. Patient denies abdominal pain. Stomach is not tender to touch. He denies moving his bowels this morning. No reported bloody stools. He ate breakfast but did not eat lunch because he said he doesn't feel good. CBG 316, BP 103/62  He was admitted to [facility] on 12/18/2021 post hospital admission 12/13/21 through 12/18/2021.  He was treated in the hospital for anemia and was transfused 1 unit PRBC.  GI was consulted and performed EGD on 12/14/2021 which showed multiple gastric ulcers.  He was started on pantoprazole.  He had a history of H. pylori without documentation of being treated in the past. He was started on Clarithromycin and metronidazole. Hospitalization was complicated by A-fib with RVR so he was re-started on lower dose of Toprol XL to avoid hypotension.   Past Medical History:  Diagnosis Date   Anemia    LOW PLATELETS OTHER DAY  PER PT   Anticoagulated on Coumadin 12/21/2012   Aortic stenosis    Arthritis    "left wrist; back sometimes" (01/21/2013)   Atrial fibrillation ([facility])    [name], DR COOPER   Bradycardia    Exertional shortness of breath    Heart murmur    "I've had it for years; runs in the family on daddy's side" (01/21/2013)   HTN (hypertension)    Hyperlipemia 12/21/2012   Prostate cancer ([facility])    "had 40 tx of radiation in 2009" (01/21/2013)   S/P aortic valve replacement with bioprosthetic valve 01/19/2013   Transcatheter Aortic Valve Replacement using 82mm Sapien bioprosthetic tissue valve via transapical approach   Type II diabetes mellitus ([facility])    Past Surgical History:  Procedure Laterality Date   BALLOON DILATION N/A 08/02/2020   Procedure: BALLOON DILATION;  Surgeon: [name], MD;  Location: [facility];  Service: Gastroenterology;  Laterality: N/A;   BIOPSY  10/03/2021  Procedure: BIOPSY;  Surgeon: [name], MD;  Location: [facility];  Service: Endoscopy;;   BIOPSY  12/14/2021   Procedure: BIOPSY;  Surgeon: Wilford Corner, MD;  Location: [facility];  Service: Endoscopy;;   CARDIAC CATHETERIZATION  12/16/2012   CARDIAC VALVE REPLACEMENT  01/19/2013   AVR   CATARACT EXTRACTION W/ INTRAOCULAR LENS  IMPLANT, BILATERAL     ESOPHAGOGASTRODUODENOSCOPY N/A 10/03/2021   Procedure:  ESOPHAGOGASTRODUODENOSCOPY (EGD);  Surgeon: [name], MD;  Location: [facility];  Service: Endoscopy;  Laterality: N/A;   ESOPHAGOGASTRODUODENOSCOPY N/A 12/14/2021   Procedure: ESOPHAGOGASTRODUODENOSCOPY (EGD);  Surgeon: Wilford Corner, MD;  Location: [facility];  Service: Endoscopy;  Laterality: N/A;   ESOPHAGOGASTRODUODENOSCOPY (EGD) WITH PROPOFOL N/A 08/02/2020   Procedure: ESOPHAGOGASTRODUODENOSCOPY (EGD) WITH PROPOFOL;  Surgeon: [name], MD;  Location: [facility];  Service: Gastroenterology;  Laterality: N/A;   EYE SURGERY     INTRAOPERATIVE TRANSESOPHAGEAL ECHOCARDIOGRAM N/A 01/19/2013   Procedure: INTRAOPERATIVE TRANSESOPHAGEAL ECHOCARDIOGRAM;  Surgeon: [name], MD;  Location: [facility];  Service: Open Heart Surgery;  Laterality: N/A;   ORIF FOREARM FRACTURE Left 1954   "compound fx" (01/21/2013)   TRANSTHORACIC ECHOCARDIOGRAM  09/04/10, 09/07/08    Allergies  Allergen Reactions   Amoxicillin Other (See Comments)    Burning of mouth, tongue and lips.  itchy, burning throat    Outpatient Encounter Medications as of 12/28/2021  Medication Sig   acetaminophen (TYLENOL) 650 MG CR tablet Take 650 mg by mouth daily with breakfast.   Amino Acids-Protein Hydrolys (PRO-STAT) LIQD Take by mouth. 30 ml TWICE A DAY   aspirin EC 81 MG tablet Take 1 tablet (81 mg total) by mouth daily. Swallow whole.   B Complex Vitamins (B COMPLEX 50) TABS Take 1 tablet by mouth daily with breakfast.   bisacodyl (DULCOLAX) 10 MG suppository If not relieved by MOM, give 10 mg Bisacodyl suppositiory rectally X 1 dose in 24 hours as needed   cholecalciferol (VITAMIN D3) 25 MCG (1000 UNIT) tablet Take 1,000 Units by mouth 2 (two) times daily with a meal.   clarithromycin (BIAXIN) 500 MG tablet Take 1 tablet (500 mg total) by mouth every 12 (twelve) hours for 13 days.   enzalutamide (XTANDI) 40 MG capsule Take 4 capsules (160 mg total) by mouth daily at 2 PM.   ferrous sulfate 325 (65 FE) MG tablet  Take 1 tablet (325 mg total) by mouth 2 (two) times daily with a meal.   gabapentin (NEURONTIN) 100 MG capsule Take 1 capsule (100 mg total) by mouth at bedtime.   Magnesium Hydroxide (MILK OF MAGNESIA PO) Take by mouth. If no BM in 3 days, give 30 cc Milk of Magnesium p.o. x 1 dose in 24 hours as needed (Do not use standing constipation orders for residents with renal failure CFR less than 30. Contact MD for orders)   metFORMIN (GLUCOPHAGE) 1000 MG tablet Take 1 tablet (1,000 mg total) by mouth 2 (two) times daily with breakfast and lunch.   metoprolol succinate (TOPROL-XL) 25 MG 24 hr tablet Take 0.5 tablets (12.5 mg total) by mouth daily.   metroNIDAZOLE (FLAGYL) 250 MG tablet Take 1 tablet (250 mg total) by mouth every 8 (eight) hours for 13 days.   Nutritional Supplements (NUTRITIONAL SUPPLEMENT PO) Take by mouth. MAGIC CUP TWICE DAILY FOR LOW ALBUMIN/PROTEIN   Nystatin (GERHARDT'S BUTT CREAM) CREA Apply 1 application topically daily as needed for irritation.   OXYGEN 2L/min via [place] continuously   pantoprazole (PROTONIX) 40 MG tablet Take 1 tablet (  40 mg total) by mouth 2 (two) times daily.   [START ON 02/17/2022] pantoprazole (PROTONIX) 40 MG tablet Take 1 tablet (40 mg total) by mouth daily with supper.   PSYLLIUM PO Take 1 capsule by mouth daily.   rosuvastatin (CRESTOR) 5 MG tablet TAKE 1 TABLET BY MOUTH ONCE DAILY AT  6PM   Sodium Phosphates (RA SALINE ENEMA RE) If not relieved by Biscodyl suppository, give disposable Saline Enema rectally X 1 dose/24 hrs as needed   Vitamin A 2400 MCG (8000 UT) TABS Take 2,400 mcg by mouth daily with breakfast.   vitamin B-12 (CYANOCOBALAMIN) 1000 MCG tablet Take 1,000 mcg by mouth 2 (two) times daily with a meal.   vitamin C (ASCORBIC ACID) 500 MG tablet Take 500 mg by mouth 2 (two) times daily with a meal.   zinc gluconate 50 MG tablet Take 50 mg by mouth daily with breakfast.   No facility-administered encounter medications on file as of 12/28/2021.     Review of Systems  Constitutional:  Positive for appetite change. Negative for activity change and fever.  HENT:  Negative for sore throat.   Eyes: Negative.   Cardiovascular:  Negative for chest pain and leg swelling.  Gastrointestinal:  Negative for abdominal distention, diarrhea and vomiting.  Genitourinary:  Negative for dysuria, frequency and urgency.  Skin:  Negative for color change.  Neurological:  Negative for dizziness and headaches.  Psychiatric/Behavioral:  Positive for hallucinations. Negative for behavioral problems and sleep disturbance. The patient is not nervous/anxious.       Immunization History  Administered Date(s) Administered   Influenza,inj,Quad PF,6+ Mos 07/15/2016, 08/01/2017, 07/24/2018, 08/27/2019, 10/05/2020, 07/12/2021   Influenza,inj,quad, With Preservative 07/21/2014, 08/14/2015   Influenza-Unspecified 07/05/2012   Janssen (J&J) SARS-COV-2 Vaccination 02/13/2020, 09/19/2020   Pneumococcal Conjugate PCV 7 08/04/2017   Pneumococcal Conjugate-13 04/28/2014   Pneumococcal Polysaccharide-23 09/03/2010   Tdap 02/05/2016   Pertinent  Health Maintenance Due  Topic Date Due   OPHTHALMOLOGY EXAM  Never done   URINE MICROALBUMIN  Never done   FOOT EXAM  03/09/2022   HEMOGLOBIN A1C  06/14/2022   INFLUENZA VACCINE  Completed   Fall Risk 12/16/2021 12/17/2021 12/17/2021 12/17/2021 12/18/2021  Falls in the past year? - - - - -  Was there an injury with Fall? - - - - -  Fall Risk Category Calculator - - - - -  Fall Risk Category - - - - -  Patient Fall Risk Level High fall risk High fall risk High fall risk High fall risk High fall risk     Vitals:   12/28/21 1523  BP: (!) 99/51  Pulse: 83  Resp: 18  Temp: (!) 97.2 F (36.2 C)  SpO2: 99%  Weight: 171 lb 3.2 oz (77.7 kg)  Height: 5' 8.5" (1.74 m)   Body mass index is 25.65 kg/m.  Physical Exam Constitutional:      Appearance: Normal appearance.     Comments: Sleepy.  HENT:     Head:  Normocephalic and atraumatic.     Mouth/Throat:     Mouth: Mucous membranes are moist.  Eyes:     Conjunctiva/sclera: Conjunctivae normal.  Cardiovascular:     Rate and Rhythm: Normal rate. Rhythm irregular.     Pulses: Normal pulses.     Heart sounds: Normal heart sounds.  Pulmonary:     Effort: Pulmonary effort is normal.     Breath sounds: Normal breath sounds.  Abdominal:     General: Bowel sounds are normal.  Palpations: Abdomen is soft.  Musculoskeletal:        General: No swelling. Normal range of motion.     Cervical back: Normal range of motion.  Skin:    General: Skin is warm and dry.  Neurological:     Comments: Sleepy. Answers queries appropriately.  Psychiatric:        Mood and Affect: Mood normal.        Behavior: Behavior normal.       Labs reviewed: Recent Labs    04/04/21 0918 10/01/21 2019 10/06/21 1041 10/19/21 0000 12/14/21 0545 12/15/21 0548 12/16/21 0102 12/17/21 0043 12/18/21 0313  NA 143   < > 132*   < > 141   < > 137 133* 133*  K 5.2   < > 3.4*   < > 4.2   < > 3.7 3.9 4.0  CL 101   < > 106   < > 106   < > 106 104 101  CO2 25   < > 19*   < > 25   < > 23 21* 22  GLUCOSE 195*   < > 324*   < > 117*   < > 178* 191* 165*  BUN 46*   < > 38*   < > 65*   < > 44* 39* 40*  CREATININE 1.38*   < > 1.07   < > 1.56*   < > 1.24 1.22 1.21  CALCIUM 9.7   < > 8.0*   < > 8.8*   < > 8.4* 8.3* 8.5*  MG 2.3  --  2.2  --  2.1  --   --   --   --   PHOS  --   --   --   --  5.0*  --   --   --   --    < > = values in this interval not displayed.   Recent Labs    12/13/21 0726 12/14/21 0545 12/15/21 0548  AST 30 29 29   ALT 17 17 20   ALKPHOS Aaron Aaron 69  BILITOT 0.7 1.4* 1.7*  PROT 5.7* 5.3* 5.5*  ALBUMIN 3.1* 2.8* 2.8*   Recent Labs    10/02/21 0613 10/03/21 0514 12/13/21 0726 12/13/21 2029 12/16/21 0102 12/17/21 0043 12/18/21 0313  WBC 7.3   < > 4.8   < > 7.3 6.3 5.9  NEUTROABS 5.6  --  3.2  --   --   --   --   HGB 7.6*   < > 6.5*   < > 8.7*  8.4* 8.9*  HCT 23.2*   < > 20.9*   < > 26.9* 26.1* 27.2*  MCV 97.5   < > 99.1   < > 97.5 99.6 98.9  PLT 214   < > 208   < > 181 187 190   < > = values in this interval not displayed.   Lab Results  Component Value Date   TSH 1.653 12/13/2021   Lab Results  Component Value Date   HGBA1C 5.4 12/15/2021   Lab Results  Component Value Date   CHOL 81 (L) 08/17/2018   HDL 28 (L) 08/17/2018   LDLCALC 37 08/17/2018   TRIG 82 08/17/2018   CHOLHDL 2.9 08/17/2018    Significant Diagnostic Results in last 30 days:  CT HEAD WO CONTRAST (5MM)  Result Date: 12/13/2021 CLINICAL DATA:  Head trauma, moderate-severe EXAM: CT HEAD WITHOUT CONTRAST TECHNIQUE: Contiguous axial images were obtained from the base of  the skull through the vertex without intravenous contrast. RADIATION DOSE REDUCTION: This exam was performed according to the departmental dose-optimization program which includes automated exposure control, adjustment of the mA and/or kV according to patient size and/or use of iterative reconstruction technique. COMPARISON:  10/01/2021 FINDINGS: Brain: There is no acute intracranial hemorrhage, significant mass effect, or edema. Gray-white differentiation is preserved. There is no extra-axial fluid collection. Prominence of the ventricles and sulci reflects similar parenchymal volume loss. Patchy low-density in the supratentorial white matter is nonspecific but may reflect stable chronic microvascular ischemic changes. Similar prominence of the extra-axial space along the right cerebral convexity that could reflect a thin subdural hygroma. Vascular: There is atherosclerotic calcification at the skull base. Skull: Calvarium is unremarkable. Sinuses/Orbits: No acute finding. Other: None. IMPRESSION: No evidence of acute intracranial injury. 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 Electronically Signed   By: Macy Mis M.D.   On: 12/13/2021 08:26   CT Cervical Spine Wo  Contrast  Result Date: 12/13/2021 CLINICAL DATA:  Neck trauma (Age >= 65y) EXAM: CT CERVICAL SPINE WITHOUT CONTRAST TECHNIQUE: Multidetector CT imaging of the cervical spine was performed without intravenous contrast. Multiplanar CT image reconstructions were also generated. RADIATION DOSE REDUCTION: This exam was performed according to the departmental dose-optimization program which includes automated exposure control, adjustment of the mA and/or kV according to patient size and/or use of iterative reconstruction technique. COMPARISON:  None. FINDINGS: Alignment: Mild degenerative listhesis. Skull base and vertebrae: Degenerative endplate irregularity. No acute fracture. Soft tissues and spinal canal: No prevertebral fluid or swelling. No visible canal hematoma. Disc levels: Multilevel degenerative changes are present including disc space narrowing, endplate osteophytes, and facet and uncovertebral hypertrophy. Upper chest: No apical lung mass. Other: Atherosclerosis including significant calcification at the common carotid bifurcations. IMPRESSION: No acute cervical spine fracture. Electronically Signed   By: Macy Mis M.D.   On: 12/13/2021 08:33   DG Chest Portable 1 View  Result Date: 12/13/2021 CLINICAL DATA:  Shortness of breath in an 88 year old [name]. EXAM: PORTABLE CHEST 1 VIEW COMPARISON:  October 01, 2021. FINDINGS: EKG leads project over the chest. Trachea is midline. Cardiomediastinal contours mild cardiac enlargement unchanged compared to recent imaging. Mild interstitial prominence is noted without frank signs of pulmonary edema. No lobar consolidation or sign of pleural fluid. No pneumothorax. On limited assessment there is no acute skeletal process. Marked LEFT glenohumeral degenerative changes are noted. IMPRESSION: Stable mild cardiac enlargement. Mild interstitial prominence slightly asymmetric could reflect changes of early CHF or atypical infection. Electronically Signed   By:  Zetta Bills M.D.   On: 12/13/2021 11:50    Assessment/Plan  1. Hallucinations -  will check UA with CS to rule out UTI -   CBC to rule out anemia/bleed  2. Acute GI bleeding -   Continue pantoprazole  3. Chronic atrial fibrillation with RVR ([facility]) -   Rate controlled, continue metoprolol succinate ER 25 mg 1/2 tab = 12.5 mg daily  4. Acute blood loss anemia Lab Results  Component Value Date   WBC 5.9 12/18/2021   HGB 8.9 (L) 12/18/2021   HCT 27.2 (L) 12/18/2021   MCV 98.9 12/18/2021   PLT 190 12/18/2021   -  12/24/21 hgb 10.4, improved -  continue continue ferrous sulfate 325 mg 1 tab twice a day and vitamin C 500 mg 1 tab twice a day  5. H. pylori infection -   Continue clarithromycin and metronidazole for a total of 13 days  Family/ staff Communication: Discussed plan of care with resident, wife, daughter and charge nurse.  Labs/tests ordered:     CBC with differential, BMP with GFR, BNP and UA with CS    Durenda Age, DNP, MSN, FNP-BC [facility] [phone] (Monday-Friday 8:00 a.m. - 5:00 p.m.) [phone] (after hours)

## 2021-12-31 DIAGNOSIS — D649 Anemia, unspecified: Secondary | ICD-10-CM | POA: Diagnosis not present

## 2021-12-31 DIAGNOSIS — K259 Gastric ulcer, unspecified as acute or chronic, without hemorrhage or perforation: Secondary | ICD-10-CM | POA: Diagnosis not present

## 2021-12-31 LAB — BASIC METABOLIC PANEL
BUN: 49 — AB (ref 4–21)
CO2: 21 (ref 13–22)
Chloride: 102 (ref 99–108)
Creatinine: 1.2 (ref 0.6–1.3)
Glucose: 168
Potassium: 4.8 (ref 3.4–5.3)
Sodium: 135 — AB (ref 137–147)

## 2021-12-31 LAB — COMPREHENSIVE METABOLIC PANEL: Calcium: 8.9 (ref 8.7–10.7)

## 2022-01-01 ENCOUNTER — Encounter: Payer: Self-pay | Admitting: Adult Health

## 2022-01-01 ENCOUNTER — Non-Acute Institutional Stay (SKILLED_NURSING_FACILITY): Payer: Medicare Other | Admitting: Adult Health

## 2022-01-01 DIAGNOSIS — N1832 Chronic kidney disease, stage 3b: Secondary | ICD-10-CM | POA: Diagnosis not present

## 2022-01-01 DIAGNOSIS — E782 Mixed hyperlipidemia: Secondary | ICD-10-CM | POA: Diagnosis not present

## 2022-01-01 DIAGNOSIS — K922 Gastrointestinal hemorrhage, unspecified: Secondary | ICD-10-CM

## 2022-01-01 DIAGNOSIS — D62 Acute posthemorrhagic anemia: Secondary | ICD-10-CM

## 2022-01-01 DIAGNOSIS — N179 Acute kidney failure, unspecified: Secondary | ICD-10-CM | POA: Diagnosis not present

## 2022-01-01 DIAGNOSIS — I5032 Chronic diastolic (congestive) heart failure: Secondary | ICD-10-CM | POA: Diagnosis not present

## 2022-01-01 DIAGNOSIS — A048 Other specified bacterial intestinal infections: Secondary | ICD-10-CM | POA: Diagnosis not present

## 2022-01-01 DIAGNOSIS — I482 Chronic atrial fibrillation, unspecified: Secondary | ICD-10-CM

## 2022-01-01 DIAGNOSIS — E1149 Type 2 diabetes mellitus with other diabetic neurological complication: Secondary | ICD-10-CM

## 2022-01-01 NOTE — Progress Notes (Signed)
Location:  [facility] Room Number: 338-S Place of Service:  SNF (31) Provider:  Durenda Age, DNP, FNP-BC  Patient Care Team: Aaron Pepper, MD as PCP - General (Family Medicine) [name], MD as PCP - Cardiology (Cardiology) Sherren Mocha, MD as Attending Physician (Cardiology) [name], MD (Inactive) as Attending Physician (Cardiothoracic Surgery) Aaron Alberts, MD (Inactive) as Attending Physician (Cardiothoracic Surgery)  Extended Emergency Contact Information Primary Emergency Contact: Bagnell          [place], [place] [address] [name] of [place] Phone: [phone] Mobile Phone: [phone] Relation: Daughter Secondary Emergency Contact: Tennis Must Address: [address]          [place], [place] [address] [name] of [place] Phone: [phone] Relation: Spouse  Code Status:  Full Code   Goals of care: Advanced Directive information Advanced Directives 01/01/2022  Does Patient Have a Medical Advance Directive? No  Type of Advance Directive -  Copy of [place] in Chart? -  Would patient like information on creating a medical advance directive? No - Patient declined  Pre-existing out of facility DNR order (yellow form or pink MOST form) -     Chief Complaint  Patient presents with   Acute Visit    Evaluation for possible discharge     HPI:  Pt is a 88 y.o. male seen today for possible discharge home. He was admitted to [facility] on 12/18/21 post hospital admission 12/13/21 to 12/18/2021.  He has a PMH of coronary artery calcification, severe aortic stenosis s/p TAVR, HFpEF, moderate mitral regurgitation, moderate tricuspid regurgitation, type 2 diabetes mellitus, essential hypertension, persistent atrial fibrillation not on anticoagulation, peripheral artery disease, chronic kidney disease stage IIIb, prior GI bleed with gastric  ulcers and previous untreated H pyloric infection.  He presented to [facility] ED with complaints of generalized weakness and s/p fall night before presentation.  Work-up revealed hemoglobin of 6.5K and positive FOBT.  She had transfusion of 1 unit PRBC.  GI was consulted and had EGD on 12/14/2021 which showed multiple gastric ulcers.  GI recommended twice daily PPI x2 months followed by daily PPI indefinitely.  He had a history of H. pylori without documentation of being treated in the past.  He was started on H. pylori antibiotic therapy.  Hospitalization was complicated by A-fib with RVR and was restarted on a lower dose of Toprol-XL to avoid hypotension. GI bleed resolved and GI signed off on 12/17/2021.  He was given extra dose of Torsemide 20 mg X 3 days in addition to Torsemide 20 mg daily due to elevated BNP 1,607. He had unna boots on BLE.   Past Medical History:  Diagnosis Date   Anemia    LOW PLATELETS OTHER DAY  PER PT   Anticoagulated on Coumadin 12/21/2012   Aortic stenosis    Arthritis    "left wrist; back sometimes" (01/21/2013)   Atrial fibrillation ([place])    [name], DR COOPER   Bradycardia    Exertional shortness of breath    Heart murmur    "I've had it for years; runs in the family on daddy's side" (01/21/2013)   HTN (hypertension)    Hyperlipemia 12/21/2012   Prostate cancer ([place])    "had 40 tx of radiation in 2009" (01/21/2013)   S/P aortic valve replacement with bioprosthetic valve 01/19/2013   Transcatheter Aortic Valve Replacement using 51m Sapien bioprosthetic tissue valve via transapical approach  Type II diabetes mellitus (HCC)   ° °Past Surgical History:  °Procedure Laterality Date  ° BALLOON DILATION N/A 08/02/2020  ° Procedure: BALLOON DILATION;  Surgeon: Karki, Arya, MD;  Location: [facility];  Service: Gastroenterology;  Laterality: N/A;  ° BIOPSY  10/03/2021  ° Procedure: BIOPSY;  Surgeon: Magod, Marc, MD;  Location: [facility];  Service:  Endoscopy;;  ° BIOPSY  12/14/2021  ° Procedure: BIOPSY;  Surgeon: Schooler, Vincent, MD;  Location: [facility];  Service: Endoscopy;;  ° CARDIAC CATHETERIZATION  12/16/2012  ° CARDIAC VALVE REPLACEMENT  01/19/2013  ° AVR  ° CATARACT EXTRACTION W/ INTRAOCULAR LENS  IMPLANT, BILATERAL    ° ESOPHAGOGASTRODUODENOSCOPY N/A 10/03/2021  ° Procedure: ESOPHAGOGASTRODUODENOSCOPY (EGD);  Surgeon: Magod, Marc, MD;  Location: [facility];  Service: Endoscopy;  Laterality: N/A;  ° ESOPHAGOGASTRODUODENOSCOPY N/A 12/14/2021  ° Procedure: ESOPHAGOGASTRODUODENOSCOPY (EGD);  Surgeon: Schooler, Vincent, MD;  Location: [facility];  Service: Endoscopy;  Laterality: N/A;  ° ESOPHAGOGASTRODUODENOSCOPY (EGD) WITH PROPOFOL N/A 08/02/2020  ° Procedure: ESOPHAGOGASTRODUODENOSCOPY (EGD) WITH PROPOFOL;  Surgeon: Karki, Arya, MD;  Location: [facility];  Service: Gastroenterology;  Laterality: N/A;  ° EYE SURGERY    ° INTRAOPERATIVE TRANSESOPHAGEAL ECHOCARDIOGRAM N/A 01/19/2013  ° Procedure: INTRAOPERATIVE TRANSESOPHAGEAL ECHOCARDIOGRAM;  Surgeon: Clarence H Owen, MD;  Location: [facility] OR;  Service: Open Heart Surgery;  Laterality: N/A;  ° ORIF FOREARM FRACTURE Left 1954  ° "compound fx" (01/21/2013)  ° TRANSTHORACIC ECHOCARDIOGRAM  09/04/10, 09/07/08  ° ° °Allergies  °Allergen Reactions  ° Amoxicillin Other (See Comments)  °  Burning of mouth, tongue and lips. ° itchy, burning throat  ° ° °Outpatient Encounter Medications as of 01/01/2022  °Medication Sig  ° acetaminophen (TYLENOL) 650 MG CR tablet Take 650 mg by mouth daily with breakfast.  ° Amino Acids-Protein Hydrolys (PRO-STAT) LIQD Take 30 mLs by mouth 2 (two) times daily.  ° aspirin EC 81 MG tablet Take 1 tablet (81 mg total) by mouth daily. Swallow whole.  ° B Complex Vitamins (B COMPLEX 50) TABS Take 1 tablet by mouth daily with breakfast.  ° bisacodyl (DULCOLAX) 10 MG suppository If not relieved by MOM, give 10 mg Bisacodyl suppositiory rectally X 1 dose in 24 hours as needed  °  cholecalciferol (VITAMIN D3) 25 MCG (1000 UNIT) tablet Take 1,000 Units by mouth 2 (two) times daily with a meal.  ° enzalutamide (XTANDI) 80 MG tablet Take 160 mg by mouth daily. 2 pm for prostate cancer  ° ferrous sulfate 325 (65 FE) MG tablet Take 1 tablet (325 mg total) by mouth 2 (two) times daily with a meal.  ° gabapentin (NEURONTIN) 100 MG capsule Take 1 capsule (100 mg total) by mouth at bedtime.  ° Magnesium Hydroxide (MILK OF MAGNESIA PO) Take by mouth. If no BM in 3 days, give 30 cc Milk of Magnesium p.o. x 1 dose in 24 hours as needed (Do not use standing constipation orders for residents with renal failure CFR less than 30. Contact MD for orders)  ° metFORMIN (GLUCOPHAGE) 1000 MG tablet Take 1 tablet (1,000 mg total) by mouth 2 (two) times daily with breakfast and lunch.  ° metoprolol succinate (TOPROL-XL) 25 MG 24 hr tablet Take 0.5 tablets (12.5 mg total) by mouth daily.  ° Nutritional Supplements (NUTRITIONAL SUPPLEMENT PO) Take by mouth. MAGIC CUP TWICE DAILY FOR LOW ALBUMIN/PROTEIN  ° Nystatin (GERHARDT'S BUTT CREAM) CREA Apply 1 application topically daily as needed for irritation.  ° OXYGEN 2L/min via [place] continuously  ° pantoprazole (PROTONIX) 40 MG   tablet Take 1 tablet (40 mg total) by mouth 2 (two) times daily.   [START ON 02/17/2022] pantoprazole (PROTONIX) 40 MG tablet Take 1 tablet (40 mg total) by mouth daily with supper.   PSYLLIUM PO Take 1 capsule by mouth daily.   rosuvastatin (CRESTOR) 5 MG tablet TAKE 1 TABLET BY MOUTH ONCE DAILY AT  6PM   Sodium Phosphates (RA SALINE ENEMA RE) If not relieved by Biscodyl suppository, give disposable Saline Enema rectally X 1 dose/24 hrs as needed   torsemide (DEMADEX) 20 MG tablet Take 20 mg by mouth daily.   Vitamin A 2400 MCG (8000 UT) TABS Take 2,400 mcg by mouth daily with breakfast.   vitamin B-12 (CYANOCOBALAMIN) 1000 MCG tablet Take 1,000 mcg by mouth 2 (two) times daily with a meal.   vitamin C (ASCORBIC ACID) 500 MG tablet Take 500  mg by mouth 2 (two) times daily with a meal.   zinc gluconate 50 MG tablet Take 50 mg by mouth daily with breakfast.   [DISCONTINUED] enzalutamide (XTANDI) 40 MG capsule Take 4 capsules (160 mg total) by mouth daily at 2 PM.   No facility-administered encounter medications on file as of 01/01/2022.    Review of Systems  Constitutional:  Negative for activity change, appetite change and fever.  HENT:  Negative for sore throat.   Eyes: Negative.   Cardiovascular:  Negative for chest pain and leg swelling.  Gastrointestinal:  Negative for abdominal distention, diarrhea and vomiting.  Genitourinary:  Negative for dysuria, frequency and urgency.  Skin:  Negative for color change.  Neurological:  Negative for dizziness and headaches.  Psychiatric/Behavioral:  Negative for behavioral problems and sleep disturbance. The patient is not nervous/anxious.       Immunization History  Administered Date(s) Administered   Influenza,inj,Quad PF,6+ Mos 07/15/2016, 08/01/2017, 07/24/2018, 08/27/2019, 10/05/2020, 07/12/2021   Influenza,inj,quad, With Preservative 07/21/2014, 08/14/2015   Influenza-Unspecified 07/05/2012   Janssen (J&J) SARS-COV-2 Vaccination 02/13/2020, 09/19/2020   Pneumococcal Conjugate PCV 7 08/04/2017   Pneumococcal Conjugate-13 04/28/2014   Pneumococcal Polysaccharide-23 09/03/2010   Tdap 02/05/2016   Pertinent  Health Maintenance Due  Topic Date Due   OPHTHALMOLOGY EXAM  Never done   URINE MICROALBUMIN  Never done   FOOT EXAM  03/09/2022   HEMOGLOBIN A1C  06/14/2022   INFLUENZA VACCINE  Completed   Fall Risk 12/16/2021 12/17/2021 12/17/2021 12/17/2021 12/18/2021  Falls in the past year? - - - - -  Was there an injury with Fall? - - - - -  Fall Risk Category Calculator - - - - -  Fall Risk Category - - - - -  Patient Fall Risk Level _0      Vitals:   01/01/22 1040  BP: (!) 114/44  Pulse: 93  Resp:  18  Temp: (!) 97.3 F (36.3 C)  SpO2: 99%  Weight: 172 lb 3.2 oz (78.1 kg)  Height: 5' 8" (1.727 m)   Body mass index is 26.18 kg/m.  Physical Exam Constitutional:      General: He is not in acute distress.    Appearance: Normal appearance.  HENT:     Head: Normocephalic and atraumatic.     Mouth/Throat:     Mouth: Mucous membranes are moist.  Eyes:     Conjunctiva/sclera: Conjunctivae normal.  Cardiovascular:     Rate and Rhythm: Normal rate. Rhythm irregular.     Pulses: Normal pulses.  Heart sounds: Normal heart sounds.  °Pulmonary:  °   Effort: Pulmonary effort is normal.  °   Breath sounds: Normal breath sounds.  °Abdominal:  °   General: Bowel sounds are normal.  °   Palpations: Abdomen is soft.  °Musculoskeletal:     °   General: No swelling.  °Skin: °   General: Skin is warm and dry.  °Neurological:  °   General: No focal deficit present.  °   Mental Status: He is alert and oriented to person, place, and time.  °Psychiatric:     °   Mood and Affect: Mood normal.     °   Behavior: Behavior normal.     °   Thought Content: Thought content normal.     °   Judgment: Judgment normal.  °  ° ° °Labs reviewed: °Recent Labs  °  04/04/21 °0918 10/01/21 °2019 10/06/21 °1041 10/19/21 °0000 12/14/21 °0545 12/15/21 °0548 12/16/21 °0102 12/17/21 °0043 12/18/21 °0313  °NA 143   < > 132*   < > 141   < > 137 133* 133*  °K 5.2   < > 3.4*   < > 4.2   < > 3.7 3.9 4.0  °CL 101   < > 106   < > 106   < > 106 104 101  °CO2 25   < > 19*   < > 25   < > 23 21* 22  °GLUCOSE 195*   < > 324*   < > 117*   < > 178* 191* 165*  °BUN 46*   < > 38*   < > 65*   < > 44* 39* 40*  °CREATININE 1.38*   < > 1.07   < > 1.56*   < > 1.24 1.22 1.21  °CALCIUM 9.7   < > 8.0*   < > 8.8*   < > 8.4* 8.3* 8.5*  °MG 2.3  --  2.2  --  2.1  --   --   --   --   °PHOS  --   --   --   --  5.0*  --   --   --   --   ° < > = values in this interval not displayed.  ° °Recent Labs  °  12/13/21 °0726 12/14/21 °0545 12/15/21 °0548  °AST 30 29 29   °ALT 17 17 20  °ALKPHOS 58 58 67  °BILITOT 0.7 1.4* 1.7*  °PROT 5.7* 5.3* 5.5*  °ALBUMIN 3.1* 2.8* 2.8*  ° °Recent Labs  °  10/02/21 °0613 10/03/21 °0514 12/13/21 °0726 12/13/21 °2029 12/16/21 °0102 12/17/21 °0043 12/18/21 °0313  °WBC 7.3   < > 4.8   < > 7.3 6.3 5.9  °NEUTROABS 5.6  --  3.2  --   --   --   --   °HGB 7.6*   < > 6.5*   < > 8.7* 8.4* 8.9*  °HCT 23.2*   < > 20.9*   < > 26.9* 26.1* 27.2*  °MCV 97.5   < > 99.1   < > 97.5 99.6 98.9  °PLT 214   < > 208   < > 181 187 190  ° < > = values in this interval not displayed.  ° °Lab Results  °Component Value Date  ° TSH 1.653 12/13/2021  ° °Lab Results  °Component Value Date  ° HGBA1C 5.4 12/15/2021  ° °Lab Results  °Component Value Date  ° CHOL 81 (L)   08/17/2018  ° HDL 28 (L) 08/17/2018  ° LDLCALC 37 08/17/2018  ° TRIG 82 08/17/2018  ° CHOLHDL 2.9 08/17/2018  ° ° °Significant Diagnostic Results in last 30 days:  °CT HEAD WO CONTRAST (5MM) ° °Result Date: 12/13/2021 °CLINICAL DATA:  Head trauma, moderate-severe EXAM: CT HEAD WITHOUT CONTRAST TECHNIQUE: Contiguous axial images were obtained from the base of the skull through the vertex without intravenous contrast. RADIATION DOSE REDUCTION: This exam was performed according to the departmental dose-optimization program which includes automated exposure control, adjustment of the mA and/or kV according to patient size and/or use of iterative reconstruction technique. COMPARISON:  10/01/2021 FINDINGS: Brain: There is no acute intracranial hemorrhage, significant mass effect, or edema. Gray-white differentiation is preserved. There is no extra-axial fluid collection. Prominence of the ventricles and sulci reflects similar parenchymal volume loss. Patchy low-density in the supratentorial white matter is nonspecific but may reflect stable chronic microvascular ischemic changes. Similar prominence of the extra-axial space along the right cerebral convexity that could reflect a thin subdural hygroma. Vascular: There is  atherosclerotic calcification at the skull base. Skull: Calvarium is unremarkable. Sinuses/Orbits: No acute finding. Other: None. IMPRESSION: No evidence of acute intracranial injury. 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 Electronically Signed   By: Praneil  Patel M.D.   On: 12/13/2021 08:26  ° °CT Cervical Spine Wo Contrast ° °Result Date: 12/13/2021 °CLINICAL DATA:  Neck trauma (Age >= 65y) EXAM: CT CERVICAL SPINE WITHOUT CONTRAST TECHNIQUE: Multidetector CT imaging of the cervical spine was performed without intravenous contrast. Multiplanar CT image reconstructions were also generated. RADIATION DOSE REDUCTION: This exam was performed according to the departmental dose-optimization program which includes automated exposure control, adjustment of the mA and/or kV according to patient size and/or use of iterative reconstruction technique. COMPARISON:  None. FINDINGS: Alignment: Mild degenerative listhesis. Skull base and vertebrae: Degenerative endplate irregularity. No acute fracture. Soft tissues and spinal canal: No prevertebral fluid or swelling. No visible canal hematoma. Disc levels: Multilevel degenerative changes are present including disc space narrowing, endplate osteophytes, and facet and uncovertebral hypertrophy. Upper chest: No apical lung mass. Other: Atherosclerosis including significant calcification at the common carotid bifurcations. IMPRESSION: No acute cervical spine fracture. Electronically Signed   By: Praneil  Patel M.D.   On: 12/13/2021 08:33  ° °DG Chest Portable 1 View ° °Result Date: 12/13/2021 °CLINICAL DATA:  Shortness of breath in an 89-year-old male. EXAM: PORTABLE CHEST 1 VIEW COMPARISON:  October 01, 2021. FINDINGS: EKG leads project over the chest. Trachea is midline. Cardiomediastinal contours mild cardiac enlargement unchanged compared to recent imaging. Mild interstitial prominence is noted without frank signs of pulmonary edema. No lobar consolidation or  sign of pleural fluid. No pneumothorax. On limited assessment there is no acute skeletal process. Marked LEFT glenohumeral degenerative changes are noted. IMPRESSION: Stable mild cardiac enlargement. Mild interstitial prominence slightly asymmetric could reflect changes of early CHF or atypical infection. Electronically Signed   By: Geoffrey  Wile M.D.   On: 12/13/2021 11:50   ° °Assessment/Plan ° °1. Acute GI bleeding °-  no bloody stool reported °-  continue Pantoprazole ° °2. Acute blood loss anemia °Lab Results  °Component Value Date  ° WBC 5.6 12/28/2021  ° HGB 10.9 (A) 12/28/2021  ° HCT 33 (A) 12/28/2021  ° MCV 98.9 12/18/2021  ° PLT 224 12/28/2021  ° °-  stable ° °3. Chronic atrial fibrillation with RVR (HCC) °-Rate controlled, continue metoprolol succinate ER for rate control ° °4. H. pylori infection °-     Completed antibiotics  5. Chronic diastolic heart failure (HCC)  -   Stable, continue torsemide  6. Type 2 diabetes mellitus with neurological complications (HCC) Lab Results  Component Value Date   HGBA1C 5.4 12/15/2021   -CBGs ranging from 177-353, continue metformin 1000 mg twice a day and gabapentin 100 mg 1 capsule at bedtime  7. Mixed hyperlipidemia Lab Results  Component Value Date   CHOL 81 (L) 08/17/2018   HDL 28 (L) 08/17/2018   LDLCALC 37 08/17/2018   TRIG 82 08/17/2018   CHOLHDL 2.9 08/17/2018   -   Continue rosuvastatin   8. Acute renal failure superimposed on stage 3b chronic kidney disease, unspecified acute renal failure type [facility]) Lab Results  Component Value Date   NA 135 (A) 12/31/2021   K 4.8 12/31/2021   CO2 21 12/31/2021   GLUCOSE 165 (H) 12/18/2021   BUN 49 (A) 12/31/2021   CREATININE 1.2 12/31/2021   CALCIUM 8.9 12/31/2021   EGFR 45 (L) 12/05/2021   GFRNONAA 58 (L) 12/18/2021   -  stable    Family/ staff Communication: Discussed plan of care with resident and charge nurse  Labs/tests ordered:   None    Durenda Age, DNP, MSN,  FNP-BC [facility] [phone] (Monday-Friday 8:00 a.m. - 5:00 p.m.) [phone] (after hours)

## 2022-01-02 ENCOUNTER — Encounter: Payer: Self-pay | Admitting: Podiatry

## 2022-01-02 ENCOUNTER — Ambulatory Visit (INDEPENDENT_AMBULATORY_CARE_PROVIDER_SITE_OTHER): Payer: Medicare Other | Admitting: Podiatry

## 2022-01-02 ENCOUNTER — Other Ambulatory Visit: Payer: Self-pay

## 2022-01-02 DIAGNOSIS — E1142 Type 2 diabetes mellitus with diabetic polyneuropathy: Secondary | ICD-10-CM | POA: Diagnosis not present

## 2022-01-02 DIAGNOSIS — B351 Tinea unguium: Secondary | ICD-10-CM | POA: Diagnosis not present

## 2022-01-02 DIAGNOSIS — M79674 Pain in right toe(s): Secondary | ICD-10-CM

## 2022-01-02 DIAGNOSIS — M79675 Pain in left toe(s): Secondary | ICD-10-CM | POA: Diagnosis not present

## 2022-01-02 NOTE — Progress Notes (Signed)
Subjective: ?[name] is a pleasant 88 y.o. male patient seen today painful thick toenails that are difficult to trim. Pain interferes with ambulation. Aggravating factors include wearing enclosed shoe gear. Pain is relieved with periodic professional debridement. ? ?He is now at [facility]. He states his wife is there also, but in a separate room. ? ?He has h/o diabetic neuropathy. He voices no new pedal problems on today's visit. States his great toes have been feeling fine lately and he has not had ingrown toenail pain. ? ?PCP is [name], MD. Last visit was: 02/09/2021. ? ?Allergies  ?Allergen Reactions  ? Amoxicillin Other (See Comments)  ?  Burning of mouth, tongue and lips. ? itchy, burning throat  ? ? ?Objective: ?Physical Exam ? ?General: [name] is a pleasant 88 y.o. Caucasian male, in NAD. AAO x 3.  ? ?Vascular:  ?CFT <4 seconds b/l LE. Palpable DP pulse(s) b/l LE. Diminished PT pulse(s) b/l LE. Pedal hair absent. Wearing bilateral lower extremity compression wraps present which are clean, dry and intact. Trace edema noted BLE. No ischemia or gangrene noted b/l LE. No cyanosis or clubbing noted b/l LE. ? ?Dermatological:  ?Bilateral lower extremity leg wraps which are clean, dry and intact; toes exposed. Pedal skin thin and atrophic b/l LE. No interdigital macerations b/l lower extremities. Toenails 1-5 b/l elongated, discolored, dystrophic, thickened, crumbly with subungual debris and tenderness to dorsal palpation. ? ?Musculoskeletal:  ?Noted disuse atrophy bilaterally. No pain, crepitus or joint limitation noted with ROM b/l lower extremities. Utilizes wheelchair for mobility assistance. ? ?Neurological:  ?Pt has subjective symptoms of neuropathy. Protective sensation diminished with 10g monofilament b/l. ? ?Assessment and Plan:  ?1. Pain due to onychomycosis of toenails of both feet   ?2. Diabetic peripheral neuropathy associated with type 2 diabetes  mellitus ([place])   ? ?-Examined patient. ?-Patient is a resident of [facility]. Continue pressure precautions daily.. ?-Patient to continue soft, supportive shoe gear daily. ?-Toenails 1-5 b/l were debrided in length and girth with sterile nail nippers and dremel without iatrogenic bleeding.  ?-Patient to report any pedal injuries to medical professional immediately. ?-Patient/POA to call should there be question/concern in the interim. ? ?Return in about 3 months (around 04/04/2022). ? ?Marzetta Board, DPM ? ?

## 2022-01-03 DIAGNOSIS — M6259 Muscle wasting and atrophy, not elsewhere classified, multiple sites: Secondary | ICD-10-CM | POA: Diagnosis not present

## 2022-01-03 DIAGNOSIS — I5032 Chronic diastolic (congestive) heart failure: Secondary | ICD-10-CM | POA: Diagnosis not present

## 2022-01-03 DIAGNOSIS — R2681 Unsteadiness on feet: Secondary | ICD-10-CM | POA: Diagnosis not present

## 2022-01-03 DIAGNOSIS — M6281 Muscle weakness (generalized): Secondary | ICD-10-CM | POA: Diagnosis not present

## 2022-01-03 DIAGNOSIS — D649 Anemia, unspecified: Secondary | ICD-10-CM | POA: Diagnosis not present

## 2022-01-04 ENCOUNTER — Telehealth: Payer: Self-pay

## 2022-01-04 DIAGNOSIS — R278 Other lack of coordination: Secondary | ICD-10-CM | POA: Diagnosis not present

## 2022-01-04 DIAGNOSIS — M6259 Muscle wasting and atrophy, not elsewhere classified, multiple sites: Secondary | ICD-10-CM | POA: Diagnosis not present

## 2022-01-04 DIAGNOSIS — M1990 Unspecified osteoarthritis, unspecified site: Secondary | ICD-10-CM | POA: Diagnosis not present

## 2022-01-04 DIAGNOSIS — I739 Peripheral vascular disease, unspecified: Secondary | ICD-10-CM | POA: Diagnosis not present

## 2022-01-04 DIAGNOSIS — M6281 Muscle weakness (generalized): Secondary | ICD-10-CM | POA: Diagnosis not present

## 2022-01-04 DIAGNOSIS — R2681 Unsteadiness on feet: Secondary | ICD-10-CM | POA: Diagnosis not present

## 2022-01-04 NOTE — Telephone Encounter (Signed)
-----   Message from [name], MD sent at 12/31/2021  6:09 PM EST ----- ?Regarding: RE: TEE ?Can we get him in with either my next available or with an APP next available? I worry his volume is going to be up.  He just has an EGD, so going with a blind probe right now may a mistake, but he does need better valve characterization. ?----- Message ----- ?From: Precious Gilding, RN ?Sent: 12/31/2021   2:55 PM EST ?To: [name], MD ?Subject: TEE                                           ? ?You were going to schedule a TEE for this pt.  However, he had a fall and was in the hospital on the day he was supposed to have OV with you.  (12/13/21) I just want to follow up to make sure he doesn't fall through the cracks.  ? ? ?Elza Rafter, RN  ? ? ?

## 2022-01-04 NOTE — Telephone Encounter (Signed)
Called pt to schedule earlier follow up with Dr. Gasper Sells. Phone continued to ring no voice mail pick.   ?

## 2022-01-05 DIAGNOSIS — I0981 Rheumatic heart failure: Secondary | ICD-10-CM | POA: Diagnosis not present

## 2022-01-05 DIAGNOSIS — J90 Pleural effusion, not elsewhere classified: Secondary | ICD-10-CM | POA: Diagnosis not present

## 2022-01-05 DIAGNOSIS — R0989 Other specified symptoms and signs involving the circulatory and respiratory systems: Secondary | ICD-10-CM | POA: Diagnosis not present

## 2022-01-07 ENCOUNTER — Encounter: Payer: Self-pay | Admitting: Adult Health

## 2022-01-07 ENCOUNTER — Non-Acute Institutional Stay (SKILLED_NURSING_FACILITY): Payer: Medicare Other | Admitting: Adult Health

## 2022-01-07 DIAGNOSIS — M6259 Muscle wasting and atrophy, not elsewhere classified, multiple sites: Secondary | ICD-10-CM | POA: Diagnosis not present

## 2022-01-07 DIAGNOSIS — K259 Gastric ulcer, unspecified as acute or chronic, without hemorrhage or perforation: Secondary | ICD-10-CM | POA: Insufficient documentation

## 2022-01-07 DIAGNOSIS — K219 Gastro-esophageal reflux disease without esophagitis: Secondary | ICD-10-CM | POA: Insufficient documentation

## 2022-01-07 DIAGNOSIS — I5032 Chronic diastolic (congestive) heart failure: Secondary | ICD-10-CM

## 2022-01-07 DIAGNOSIS — C61 Malignant neoplasm of prostate: Secondary | ICD-10-CM

## 2022-01-07 DIAGNOSIS — M6281 Muscle weakness (generalized): Secondary | ICD-10-CM | POA: Diagnosis not present

## 2022-01-07 DIAGNOSIS — M1990 Unspecified osteoarthritis, unspecified site: Secondary | ICD-10-CM | POA: Diagnosis not present

## 2022-01-07 DIAGNOSIS — D62 Acute posthemorrhagic anemia: Secondary | ICD-10-CM | POA: Diagnosis not present

## 2022-01-07 DIAGNOSIS — I482 Chronic atrial fibrillation, unspecified: Secondary | ICD-10-CM

## 2022-01-07 DIAGNOSIS — I739 Peripheral vascular disease, unspecified: Secondary | ICD-10-CM | POA: Diagnosis not present

## 2022-01-07 DIAGNOSIS — R2681 Unsteadiness on feet: Secondary | ICD-10-CM | POA: Diagnosis not present

## 2022-01-07 DIAGNOSIS — R278 Other lack of coordination: Secondary | ICD-10-CM | POA: Diagnosis not present

## 2022-01-07 DIAGNOSIS — D649 Anemia, unspecified: Secondary | ICD-10-CM | POA: Diagnosis not present

## 2022-01-07 NOTE — Progress Notes (Signed)
Location:  [facility] Room Number: 161-W Place of Service:  SNF (31) Provider:  Durenda Age, DNP, FNP-BC  Patient Care Team: [name], MD as PCP - General (Family Medicine) [name], MD as PCP - Cardiology (Cardiology) Sherren Mocha, MD as Attending Physician (Cardiology) [name], MD (Inactive) as Attending Physician (Cardiothoracic Surgery) [name], MD (Inactive) as Attending Physician (Cardiothoracic Surgery)  Extended Emergency Contact Information Primary Emergency Contact: [place]          [place], [place] [address] [name] of [place] Phone: [phone] Mobile Phone: [phone] Relation: Daughter Secondary Emergency Contact: Tennis Must Address: [address]          [place], [place] [address] [name] of [place] Phone: [phone] Relation: Spouse  Code Status:  Full Code  Goals of care: Advanced Directive information Advanced Directives 01/07/2022  Does Patient Have a Medical Advance Directive? No  Type of Advance Directive -  Copy of [place] in Chart? -  Would patient like information on creating a medical advance directive? No - Patient declined  Pre-existing out of facility DNR order (yellow form or pink MOST form) -     Chief Complaint  Patient presents with   Acute Visit    Short term rehabilitation     HPI:  Pt is a 88 y.o. male seen today for for an acute visit. He is currently having PT and OT. He has a PMH of coronary artery calcification, severe aortic stenosis S/P TAVR, HFpEF, moderate mitral regurgitation, moderate tricuspid regugitation, type 2 diabetes mellitus, essential hypertension, persistent atrial fibrillation not on anticoagulant, peripheral artery disease, chronic kidney disease stage IIIb, prior GI bleed with gastric ulcers and previous untreated H. Pyloric infection.  Acute blood loss anemia  -  hgb  10.9, up from  hgb 8.9, takes FeSO4 325 mg BID                                                            Chronic atrial fibrillation with RVR (HCC) -  takes Metoprolol succinate ER 12.5 mg daily  Chronic diastolic heart failure (HCC) - left hand 2+edema, takes Torsemide 20 mg daily  Prostate cancer ([place]) -  takes Xtandi 80 mg 2 tabs = 160 mg at 2PM daily    Past Medical History:  Diagnosis Date   Anemia    LOW PLATELETS OTHER DAY  PER PT   Anticoagulated on Coumadin 12/21/2012   Aortic stenosis    Arthritis    "left wrist; back sometimes" (01/21/2013)   Atrial fibrillation ([place])    [name], DR COOPER   Bradycardia    Exertional shortness of breath    Heart murmur    "I've had it for years; runs in the family on daddy's side" (01/21/2013)   HTN (hypertension)    Hyperlipemia 12/21/2012   Prostate cancer ([place])    "had 40 tx of radiation in 2009" (01/21/2013)   S/P aortic valve replacement with bioprosthetic valve 01/19/2013   Transcatheter Aortic Valve Replacement using 68m Sapien bioprosthetic tissue valve via transapical approach   Type II diabetes mellitus ([place]    Past Surgical History:  Procedure Laterality Date   BALLOON DILATION N/A 08/02/2020   Procedure: BALLOON DILATION;  Surgeon: [name] MD;  Location: [facility];  Service: Gastroenterology;  Laterality: N/A;   BIOPSY  10/03/2021   Procedure: BIOPSY;  Surgeon: [name], MD;  Location: [facility];  Service: Endoscopy;;   BIOPSY  12/14/2021   Procedure: BIOPSY;  Surgeon: Wilford Corner, MD;  Location: [facility];  Service: Endoscopy;;   CARDIAC CATHETERIZATION  12/16/2012   CARDIAC VALVE REPLACEMENT  01/19/2013   AVR   CATARACT EXTRACTION W/ INTRAOCULAR LENS  IMPLANT, BILATERAL     ESOPHAGOGASTRODUODENOSCOPY N/A 10/03/2021   Procedure: ESOPHAGOGASTRODUODENOSCOPY (EGD);  Surgeon: [name], MD;  Location: [facility];  Service: Endoscopy;  Laterality: N/A;   ESOPHAGOGASTRODUODENOSCOPY N/A 12/14/2021   Procedure:  ESOPHAGOGASTRODUODENOSCOPY (EGD);  Surgeon: Wilford Corner, MD;  Location: [facility];  Service: Endoscopy;  Laterality: N/A;   ESOPHAGOGASTRODUODENOSCOPY (EGD) WITH PROPOFOL N/A 08/02/2020   Procedure: ESOPHAGOGASTRODUODENOSCOPY (EGD) WITH PROPOFOL;  Surgeon: [name], MD;  Location: [facility];  Service: Gastroenterology;  Laterality: N/A;   EYE SURGERY     INTRAOPERATIVE TRANSESOPHAGEAL ECHOCARDIOGRAM N/A 01/19/2013   Procedure: INTRAOPERATIVE TRANSESOPHAGEAL ECHOCARDIOGRAM;  Surgeon: [name], MD;  Location: [facility];  Service: Open Heart Surgery;  Laterality: N/A;   ORIF FOREARM FRACTURE Left 1954   "compound fx" (01/21/2013)   TRANSTHORACIC ECHOCARDIOGRAM  09/04/10, 09/07/08    Allergies  Allergen Reactions   Amoxicillin Other (See Comments)    Burning of mouth, tongue and lips.  itchy, burning throat    Outpatient Encounter Medications as of 01/07/2022  Medication Sig   acetaminophen (TYLENOL) 650 MG CR tablet Take 650 mg by mouth daily with breakfast.   Amino Acids-Protein Hydrolys (PRO-STAT) LIQD Take 30 mLs by mouth 2 (two) times daily.   aspirin EC 81 MG tablet Take 1 tablet (81 mg total) by mouth daily. Swallow whole.   B Complex Vitamins (B COMPLEX 50) TABS Take 1 tablet by mouth daily with breakfast.   bisacodyl (DULCOLAX) 10 MG suppository If not relieved by MOM, give 10 mg Bisacodyl suppositiory rectally X 1 dose in 24 hours as needed   cholecalciferol (VITAMIN D3) 25 MCG (1000 UNIT) tablet Take 1,000 Units by mouth 2 (two) times daily with a meal.   enzalutamide (XTANDI) 80 MG tablet Take 160 mg by mouth daily. 2 pm for prostate cancer   ferrous sulfate 325 (65 FE) MG tablet Take 1 tablet (325 mg total) by mouth 2 (two) times daily with a meal.   gabapentin (NEURONTIN) 100 MG capsule Take 1 capsule (100 mg total) by mouth at bedtime.   Magnesium Hydroxide (MILK OF MAGNESIA PO) Take by mouth. If no BM in 3 days, give 30 cc Milk of Magnesium p.o. x 1 dose in 24  hours as needed (Do not use standing constipation orders for residents with renal failure CFR less than 30. Contact MD for orders)   metFORMIN (GLUCOPHAGE) 1000 MG tablet Take 1 tablet (1,000 mg total) by mouth 2 (two) times daily with breakfast and lunch.   metoprolol succinate (TOPROL-XL) 25 MG 24 hr tablet Take 0.5 tablets (12.5 mg total) by mouth daily.   Nutritional Supplements (NUTRITIONAL SUPPLEMENT PO) Take by mouth. MAGIC CUP TWICE DAILY FOR LOW ALBUMIN/PROTEIN   Nystatin (GERHARDT'S BUTT CREAM) CREA Apply 1 application topically daily as needed for irritation.   OXYGEN 2L/min via [place] continuously   pantoprazole (PROTONIX) 40 MG tablet Take 1 tablet (40 mg total) by mouth 2 (two) times daily.   [START ON 02/17/2022] pantoprazole (PROTONIX) 40 MG tablet Take 1 tablet (40 mg total) by mouth daily  with supper.   PSYLLIUM PO Take 1 capsule by mouth daily.   rosuvastatin (CRESTOR) 5 MG tablet TAKE 1 TABLET BY MOUTH ONCE DAILY AT  6PM   Sodium Phosphates (RA SALINE ENEMA RE) If not relieved by Biscodyl suppository, give disposable Saline Enema rectally X 1 dose/24 hrs as needed   torsemide (DEMADEX) 20 MG tablet Take 20 mg by mouth daily.   Vitamin A 2400 MCG (8000 UT) TABS Take 2,400 mcg by mouth daily with breakfast.   vitamin B-12 (CYANOCOBALAMIN) 1000 MCG tablet Take 1,000 mcg by mouth 2 (two) times daily with a meal.   vitamin C (ASCORBIC ACID) 500 MG tablet Take 500 mg by mouth 2 (two) times daily with a meal.   Zinc 50 MG TABS Take 50 mg by mouth daily.   zinc gluconate 50 MG tablet Take 50 mg by mouth daily with breakfast.   No facility-administered encounter medications on file as of 01/07/2022.    Review of Systems  Constitutional:  Negative for activity change, appetite change and fever.  HENT:  Negative for sore throat.   Eyes: Negative.   Cardiovascular:  Negative for chest pain and leg swelling.  Gastrointestinal:  Negative for abdominal distention, diarrhea and vomiting.   Genitourinary:  Negative for dysuria, frequency and urgency.  Skin:  Negative for color change.  Neurological:  Negative for dizziness and headaches.  Psychiatric/Behavioral:  Negative for behavioral problems and sleep disturbance. The patient is not nervous/anxious.       Immunization History  Administered Date(s) Administered   Influenza,inj,Quad PF,6+ Mos 07/15/2016, 08/01/2017, 07/24/2018, 08/27/2019, 10/05/2020, 07/12/2021   Influenza,inj,quad, With Preservative 07/21/2014, 08/14/2015   Influenza-Unspecified 07/05/2012   Janssen (J&J) SARS-COV-2 Vaccination 02/13/2020, 09/19/2020   Pneumococcal Conjugate PCV 7 08/04/2017   Pneumococcal Conjugate-13 04/28/2014   Pneumococcal Polysaccharide-23 09/03/2010   Tdap 02/05/2016   Pertinent  Health Maintenance Due  Topic Date Due   OPHTHALMOLOGY EXAM  Never done   URINE MICROALBUMIN  Never done   FOOT EXAM  03/09/2022   HEMOGLOBIN A1C  06/14/2022   INFLUENZA VACCINE  Completed   Fall Risk 12/16/2021 12/17/2021 12/17/2021 12/17/2021 12/18/2021  Falls in the past year? - - - - -  Was there an injury with Fall? - - - - -  Fall Risk Category Calculator - - - - -  Fall Risk Category - - - - -  Patient Fall Risk Level High fall risk High fall risk High fall risk High fall risk High fall risk     Vitals:   01/07/22 1053  BP: 113/69  Pulse: 97  Resp: 20  Temp: 97.7 F (36.5 C)  SpO2: 100%  Weight: 171 lb (77.6 kg)  Height: '5\' 8"'$  (1.727 m)   Body mass index is 26 kg/m.  Physical Exam Constitutional:      General: He is not in acute distress.    Appearance: Normal appearance.  HENT:     Head: Normocephalic and atraumatic.     Mouth/Throat:     Mouth: Mucous membranes are moist.  Eyes:     Conjunctiva/sclera: Conjunctivae normal.  Cardiovascular:     Rate and Rhythm: Normal rate. Rhythm irregular.     Pulses: Normal pulses.     Heart sounds: Normal heart sounds.  Pulmonary:     Effort: Pulmonary effort is normal.      Breath sounds: Normal breath sounds.  Abdominal:     General: Bowel sounds are normal.     Palpations: Abdomen is soft.  Musculoskeletal:        General: No swelling. Normal range of motion.     Cervical back: Normal range of motion.     Comments: Left hand 2+edema.  Skin:    General: Skin is warm and dry.  Neurological:     General: No focal deficit present.     Mental Status: He is alert and oriented to person, place, and time.  Psychiatric:        Mood and Affect: Mood normal.        Behavior: Behavior normal.        Thought Content: Thought content normal.        Judgment: Judgment normal.       Labs reviewed: Recent Labs    04/04/21 0918 10/01/21 2019 10/06/21 1041 10/19/21 0000 12/14/21 0545 12/15/21 0548 12/16/21 0102 12/17/21 0043 12/18/21 0313 12/28/21 0000 12/31/21 0000  NA 143   < > 132*   < > 141   < > 137 133* 133* 135* 135*  K 5.2   < > 3.4*   < > 4.2   < > 3.7 3.9 4.0 5.5* 4.8  CL 101   < > 106   < > 106   < > 106 104 101 105 102  CO2 25   < > 19*   < > 25   < > 23 21* '22 20 21  '$ GLUCOSE 195*   < > 324*   < > 117*   < > 178* 191* 165*  --   --   BUN 46*   < > 38*   < > 65*   < > 44* 39* 40* 55* 49*  CREATININE 1.38*   < > 1.07   < > 1.56*   < > 1.24 1.22 1.21 1.5* 1.2  CALCIUM 9.7   < > 8.0*   < > 8.8*   < > 8.4* 8.3* 8.5* 8.2* 8.9  MG 2.3  --  2.2  --  2.1  --   --   --   --   --   --   PHOS  --   --   --   --  5.0*  --   --   --   --   --   --    < > = values in this interval not displayed.   Recent Labs    12/13/21 0726 12/14/21 0545 12/15/21 0548  AST '30 29 29  '$ ALT '17 17 20  '$ ALKPHOS 58 58 67  BILITOT 0.7 1.4* 1.7*  PROT 5.7* 5.3* 5.5*  ALBUMIN 3.1* 2.8* 2.8*   Recent Labs    10/02/21 0613 10/03/21 0514 12/13/21 0726 12/13/21 2029 12/16/21 0102 12/17/21 0043 12/18/21 0313 12/28/21 0000  WBC 7.3   < > 4.8   < > 7.3 6.3 5.9 5.6  NEUTROABS 5.6  --  3.2  --   --   --   --  4.00  HGB 7.6*   < > 6.5*   < > 8.7* 8.4* 8.9* 10.9*  HCT  23.2*   < > 20.9*   < > 26.9* 26.1* 27.2* 33*  MCV 97.5   < > 99.1   < > 97.5 99.6 98.9  --   PLT 214   < > 208   < > 181 187 190 224   < > = values in this interval not displayed.   Lab Results  Component Value Date   TSH 1.653 12/13/2021  Lab Results  Component Value Date   HGBA1C 5.4 12/15/2021   Lab Results  Component Value Date   CHOL 81 (L) 08/17/2018   HDL 28 (L) 08/17/2018   LDLCALC 37 08/17/2018   TRIG 82 08/17/2018   CHOLHDL 2.9 08/17/2018    Significant Diagnostic Results in last 30 days:  CT HEAD WO CONTRAST (5MM)  Result Date: 12/13/2021 CLINICAL DATA:  Head trauma, moderate-severe EXAM: CT HEAD WITHOUT CONTRAST TECHNIQUE: Contiguous axial images were obtained from the base of the skull through the vertex without intravenous contrast. RADIATION DOSE REDUCTION: This exam was performed according to the departmental dose-optimization program which includes automated exposure control, adjustment of the mA and/or kV according to patient size and/or use of iterative reconstruction technique. COMPARISON:  10/01/2021 FINDINGS: Brain: There is no acute intracranial hemorrhage, significant mass effect, or edema. Gray-white differentiation is preserved. There is no extra-axial fluid collection. Prominence of the ventricles and sulci reflects similar parenchymal volume loss. Patchy low-density in the supratentorial white matter is nonspecific but may reflect stable chronic microvascular ischemic changes. Similar prominence of the extra-axial space along the right cerebral convexity that could reflect a thin subdural hygroma. Vascular: There is atherosclerotic calcification at the skull base. Skull: Calvarium is unremarkable. Sinuses/Orbits: No acute finding. Other: None. IMPRESSION: No evidence of acute intracranial injury. 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 Electronically Signed   By: Macy Mis M.D.   On: 12/13/2021 08:26   CT Cervical Spine Wo  Contrast  Result Date: 12/13/2021 CLINICAL DATA:  Neck trauma (Age >= 65y) EXAM: CT CERVICAL SPINE WITHOUT CONTRAST TECHNIQUE: Multidetector CT imaging of the cervical spine was performed without intravenous contrast. Multiplanar CT image reconstructions were also generated. RADIATION DOSE REDUCTION: This exam was performed according to the departmental dose-optimization program which includes automated exposure control, adjustment of the mA and/or kV according to patient size and/or use of iterative reconstruction technique. COMPARISON:  None. FINDINGS: Alignment: Mild degenerative listhesis. Skull base and vertebrae: Degenerative endplate irregularity. No acute fracture. Soft tissues and spinal canal: No prevertebral fluid or swelling. No visible canal hematoma. Disc levels: Multilevel degenerative changes are present including disc space narrowing, endplate osteophytes, and facet and uncovertebral hypertrophy. Upper chest: No apical lung mass. Other: Atherosclerosis including significant calcification at the common carotid bifurcations. IMPRESSION: No acute cervical spine fracture. Electronically Signed   By: Macy Mis M.D.   On: 12/13/2021 08:33   DG Chest Portable 1 View  Result Date: 12/13/2021 CLINICAL DATA:  Shortness of breath in an 88 year old male. EXAM: PORTABLE CHEST 1 VIEW COMPARISON:  October 01, 2021. FINDINGS: EKG leads project over the chest. Trachea is midline. Cardiomediastinal contours mild cardiac enlargement unchanged compared to recent imaging. Mild interstitial prominence is noted without frank signs of pulmonary edema. No lobar consolidation or sign of pleural fluid. No pneumothorax. On limited assessment there is no acute skeletal process. Marked LEFT glenohumeral degenerative changes are noted. IMPRESSION: Stable mild cardiac enlargement. Mild interstitial prominence slightly asymmetric could reflect changes of early CHF or atypical infection. Electronically Signed   By:  Zetta Bills M.D.   On: 12/13/2021 11:50    Assessment/Plan  1. Acute blood loss anemia Lab Results  Component Value Date   WBC 5.6 12/28/2021   HGB 10.9 (A) 12/28/2021   HCT 33 (A) 12/28/2021   MCV 98.9 12/18/2021   PLT 224 12/28/2021   - improved -  continue FeSO4  2. Chronic atrial fibrillation with RVR (HCC) -  rate-controlled, continue Metoprolol  succinate ER  3. Chronic diastolic heart failure (HCC) -  edema glove to left hand, encourage to elevate left hand  4. Prostate cancer ([place]) -  stable, continue Engineer, materials Communication: Discussed plan of care with resident and charge nurse.  Labs/tests ordered: None    Durenda Age, DNP, MSN, FNP-BC [facility] [phone] (Monday-Friday 8:00 a.m. - 5:00 p.m.) [phone] (after hours)

## 2022-01-08 DIAGNOSIS — M6281 Muscle weakness (generalized): Secondary | ICD-10-CM | POA: Diagnosis not present

## 2022-01-08 DIAGNOSIS — R278 Other lack of coordination: Secondary | ICD-10-CM | POA: Diagnosis not present

## 2022-01-08 DIAGNOSIS — M1990 Unspecified osteoarthritis, unspecified site: Secondary | ICD-10-CM | POA: Diagnosis not present

## 2022-01-08 DIAGNOSIS — I739 Peripheral vascular disease, unspecified: Secondary | ICD-10-CM | POA: Diagnosis not present

## 2022-01-08 DIAGNOSIS — R2681 Unsteadiness on feet: Secondary | ICD-10-CM | POA: Diagnosis not present

## 2022-01-08 DIAGNOSIS — M6259 Muscle wasting and atrophy, not elsewhere classified, multiple sites: Secondary | ICD-10-CM | POA: Diagnosis not present

## 2022-01-08 NOTE — Progress Notes (Addendum)
Office Visit    Patient Name: [name] Date of Encounter: 01/08/2022  Primary Care Provider:  [name], MD Primary Cardiologist:  [name], MD  [facility] follow-up for GI bleed and recent fall  History of Present Illness    [name] is a 88 y.o. male with PMH of PMH of  severe aortic stenosis s/p TAVR (2014), HTN, DM type II, persistent AF not on anticoagulation due to frequent falls, prostate CA,peripheral artery disease, chronic kidney disease stage 3b, prior GI bleed,HFpEF, echo performed and revealed LVEF 60 to 65% with no regional wall motion abnormalities.    He was last seen by Dr. Gasper Sells in January for lower extremity edema.  He was noted to have SOB, DOE, PND and orthopnea.  He has moderate mitral regurgitation and this resolved with 40 mg of Lasix twice daily and resulting 5 pound weight loss.  He was scheduled to undergo TEE for evaluation of mitral valve but presented to ED on 2/9 for weakness and fall.  During initial work-up his labs revealed symptomatic anemia with hemoglobin of 7.5 and hematocrit 22.7. He was treated with 2 units PRBCs.  GI was consulted and patient had EGD performed revealing oozing gastric ulcer.  Treated with Protonix Toprol XL was continued at 12.5 mg.   Presents today for follow up. He was discharged to skilled nursing facility 2/14. Since being discharged from the hospital and SNF [name] reports doing better since his admission.Today he denies chest pain, palpitations, nausea, vomiting, dizziness, syncope, edema, or early satiety.  On examination he is still quite edematous from his thighs down to his lower extremities.  Lung sounds indicated fluid volume overload with rales present bilaterally. He reports no additional blood loss in his stool.  He he has shortness of breath at night and requires 2 pillows for sleep.     Past Medical History    Past Medical History:  Diagnosis Date    Anemia    LOW PLATELETS OTHER DAY  PER PT   Anticoagulated on Coumadin 12/21/2012   Aortic stenosis    Arthritis    "left wrist; back sometimes" (01/21/2013)   Atrial fibrillation ([place])    [name], DR COOPER   Bradycardia    Exertional shortness of breath    Heart murmur    "I've had it for years; runs in the family on daddy's side" (01/21/2013)   HTN (hypertension)    Hyperlipemia 12/21/2012   Prostate cancer ([place])    "had 40 tx of radiation in 2009" (01/21/2013)   S/P aortic valve replacement with bioprosthetic valve 01/19/2013   Transcatheter Aortic Valve Replacement using 81m Sapien bioprosthetic tissue valve via transapical approach   [place] II [place]    Past Surgical History:  Procedure Laterality Date   BALLOON DILATION N/A 08/02/2020   Procedure: BALLOON DILATION;  Surgeon: [name] MD;  Location: [facility];  Service: Gastroenterology;  Laterality: N/A;   BIOPSY  10/03/2021   Procedure: BIOPSY;  Surgeon: [name] MD;  Location: [facility];  Service: Endoscopy;;   BIOPSY  12/14/2021   Procedure: BIOPSY;  Surgeon: [name] MD;  [facility] [facility]  Service: Endoscopy;;   CARDIAC CATHETERIZATION  12/16/2012   CARDIAC VALVE REPLACEMENT  01/19/2013   AVR   CATARACT EXTRACTION W/ INTRAOCULAR LENS  IMPLANT, BILATERAL     ESOPHAGOGASTRODUODENOSCOPY N/A 10/03/2021   Procedure: ESOPHAGOGASTRODUODENOSCOPY (EGD);  Surgeon: [name] MD;  Location:  [facility];  Service: Endoscopy;  Laterality: N/A;   ESOPHAGOGASTRODUODENOSCOPY N/A 12/14/2021   Procedure: ESOPHAGOGASTRODUODENOSCOPY (EGD);  Surgeon: Wilford Corner, MD;  Location: [place];  Service: Endoscopy;  Laterality: N/A;   ESOPHAGOGASTRODUODENOSCOPY (EGD) WITH PROPOFOL N/A 08/02/2020   Procedure: ESOPHAGOGASTRODUODENOSCOPY (EGD) WITH PROPOFOL;  Surgeon: [name], MD;  [facility];  Service: Gastroenterology;  Laterality: N/A;   EYE SURGERY     INTRAOPERATIVE  TRANSESOPHAGEAL ECHOCARDIOGRAM N/A 01/19/2013   Procedure: INTRAOPERATIVE TRANSESOPHAGEAL ECHOCARDIOGRAM;  Surgeon: [name], MD;  Location: [place];  Service: Open Heart Surgery;  Laterality: N/A;   ORIF FOREARM FRACTURE Left 1954   "compound fx" (01/21/2013)   TRANSTHORACIC ECHOCARDIOGRAM  09/04/10, 09/07/08    Allergies  Allergies  Allergen Reactions   Amoxicillin Other (See Comments)    Burning of mouth, tongue and lips.  itchy, burning throat    Home Medications    Current Outpatient Medications  Medication Sig Dispense Refill   acetaminophen (TYLENOL) 650 MG CR tablet Take 650 mg by mouth daily with breakfast.     Amino Acids-Protein Hydrolys (PRO-STAT) LIQD Take 30 mLs by mouth 2 (two) times daily.     aspirin EC 81 MG tablet Take 1 tablet (81 mg total) by mouth daily. Swallow whole. 90 tablet 0   B Complex Vitamins (B COMPLEX 50) TABS Take 1 tablet by mouth daily with breakfast.     bisacodyl (DULCOLAX) 10 MG suppository If not relieved by MOM, give 10 mg Bisacodyl suppositiory rectally X 1 dose in 24 hours as needed     cholecalciferol (VITAMIN D3) 25 MCG (1000 UNIT) tablet Take 1,000 Units by mouth 2 (two) times daily with a meal.     enzalutamide (XTANDI) 80 MG tablet Take 160 mg by mouth daily. 2 pm for prostate cancer     ferrous sulfate 325 (65 FE) MG tablet Take 1 tablet (325 mg total) by mouth 2 (two) times daily with a meal. 180 tablet 0   gabapentin (NEURONTIN) 100 MG capsule Take 1 capsule (100 mg total) by mouth at bedtime. 30 capsule 0   Magnesium Hydroxide (MILK OF MAGNESIA PO) Take by mouth. If no BM in 3 days, give 30 cc Milk of Magnesium p.o. x 1 dose in 24 hours as needed (Do not use standing constipation orders for residents with renal failure CFR less than 30. Contact MD for orders)     metFORMIN (GLUCOPHAGE) 1000 MG tablet Take 1 tablet (1,000 mg total) by mouth 2 (two) times daily with breakfast and lunch. 60 tablet 0   metoprolol succinate (TOPROL-XL)  25 MG 24 hr tablet Take 0.5 tablets (12.5 mg total) by mouth daily. 15 tablet 0   Nutritional Supplements (NUTRITIONAL SUPPLEMENT PO) Take by mouth. MAGIC CUP TWICE DAILY FOR LOW ALBUMIN/PROTEIN     Nystatin (GERHARDT'S BUTT CREAM) CREA Apply 1 application topically daily as needed for irritation. 1 each 0   OXYGEN 2L/min via [place] continuously     pantoprazole (PROTONIX) 40 MG tablet Take 1 tablet (40 mg total) by mouth 2 (two) times daily. 120 tablet 0   [START ON 02/17/2022] pantoprazole (PROTONIX) 40 MG tablet Take 1 tablet (40 mg total) by mouth daily with supper. 90 tablet 0   PSYLLIUM PO Take 1 capsule by mouth daily.     rosuvastatin (CRESTOR) 5 MG tablet TAKE 1 TABLET BY MOUTH ONCE DAILY AT  6PM 30 tablet 0   Sodium Phosphates (RA SALINE ENEMA RE) If not relieved by Biscodyl suppository,  give disposable Saline Enema rectally X 1 dose/24 hrs as needed     torsemide (DEMADEX) 20 MG tablet Take 20 mg by mouth daily.     Vitamin A 2400 MCG (8000 UT) TABS Take 2,400 mcg by mouth daily with breakfast.     vitamin B-12 (CYANOCOBALAMIN) 1000 MCG tablet Take 1,000 mcg by mouth 2 (two) times daily with a meal.     vitamin C (ASCORBIC ACID) 500 MG tablet Take 500 mg by mouth 2 (two) times daily with a meal.     Zinc 50 MG TABS Take 50 mg by mouth daily.     zinc gluconate 50 MG tablet Take 50 mg by mouth daily with breakfast.     No current facility-administered medications for this visit.     Review of Systems   General:  No chills, fever, night sweats or weight changes.  Cardiovascular:  No chest pain, dyspnea on exertion, edema, orthopnea, palpitations, paroxysmal nocturnal dyspnea. Dermatological: No rash, lesions/masses Respiratory: No cough, dyspnea Urologic: No hematuria, dysuria Abdominal:   No nausea, vomiting, diarrhea, bright red blood per rectum, melena, or hematemesis Neurologic:  No visual changes, wkns, changes in mental status. All other systems reviewed and are otherwise  negative except as noted above.  Physical Exam    Wt Readings from Last 3 Encounters:  01/07/22 171 lb (77.6 kg)  01/01/22 172 lb 3.2 oz (78.1 kg)  12/28/21 171 lb 3.2 oz (77.7 kg)    VS: Vitals:   01/09/22 1539  BP: (!) 130/50  Pulse: 76  SpO2: 96%     GEN: Well nourished, well developed, in no acute distress. Neck: Supple, no JVD, carotid bruits, or masses. Cardiac: RRR, no murmurs, rubs, or gallops. No clubbing, cyanosis, edema.  Radials/PT 2+ and equal bilaterally.  Respiratory:  Respirations regular and unlabored, clear to auscultation bilaterally. MS: no deformity or atrophy. Skin: warm and dry, no rash. Neuro:  Strength and sensation are intact. Psych: Normal affect.  Accessory Clinical Findings    ECG personally reviewed by me today -no EKG today.  Risk Assessment/Calculations:    CHA2DS2-VASc Score =   4   This indicates a  % annual risk of stroke. The patient's score is based upon:      Lab Results  Component Value Date   WBC 5.6 12/28/2021   HGB 10.9 (A) 12/28/2021   HCT 33 (A) 12/28/2021   MCV 98.9 12/18/2021   PLT 224 12/28/2021   Lab Results  Component Value Date   CREATININE 1.2 12/31/2021   BUN 49 (A) 12/31/2021   NA 135 (A) 12/31/2021   K 4.8 12/31/2021   CL 102 12/31/2021   CO2 21 12/31/2021   Lab Results  Component Value Date   ALT 20 12/15/2021   AST 29 12/15/2021   ALKPHOS 67 12/15/2021   BILITOT 1.7 (H) 12/15/2021   Lab Results  Component Value Date   CHOL 81 (L) 08/17/2018   HDL 28 (L) 08/17/2018   LDLCALC 37 08/17/2018   TRIG 82 08/17/2018   CHOLHDL 2.9 08/17/2018    Lab Results  Component Value Date   HGBA1C 5.4 12/15/2021    Assessment & Plan    1. Diastolic heart failure: -s/p TAVR, 2018, lower extremity swelling still present from thighs to lower extremities -NYHA class III, Stage C -Continue metoprolol succinate 25 mg -torsemide increased to 20 mg twice daily -May increase GDMT with lisinopril if  potassium is WNL -BMET in 1 week  2. Hypertension: -  Blood pressure well controlled today at 130/50 -Continue metoprolol succinate 25 mg  3. Persistent A-fib: -Currently not on anticoagulant due to history of falls and ICH. -CHADSVASC 4 -Aspirin 81 mg continued per gastroenterology  4. GI bleed: Recently admitted for GI bleed and oozing gastric ulcer on 2/9.  -No reports of hemoptysis or melena -Continue Protonix 40 mg  Disposition: Follow-up with Ambrose Pancoast, NP in 2 weeks    Medication Adjustments/Labs and Tests Ordered: Current medicines are reviewed at length with the patient today.  Concerns regarding medicines are outlined above.  Tests Ordered: No orders of the defined types were placed in this encounter.  Medication Changes: Torsemide increased to 20 mg twice daily  Mable Fill, Marissa Nestle, NP 01/08/2022, 7:51 PM

## 2022-01-09 ENCOUNTER — Other Ambulatory Visit: Payer: Self-pay

## 2022-01-09 ENCOUNTER — Ambulatory Visit (INDEPENDENT_AMBULATORY_CARE_PROVIDER_SITE_OTHER): Payer: Medicare Other | Admitting: Nurse Practitioner

## 2022-01-09 ENCOUNTER — Encounter: Payer: Self-pay | Admitting: Nurse Practitioner

## 2022-01-09 VITALS — BP 130/50 | HR 76 | Ht 68.0 in | Wt 168.4 lb

## 2022-01-09 DIAGNOSIS — I4819 Other persistent atrial fibrillation: Secondary | ICD-10-CM

## 2022-01-09 DIAGNOSIS — I10 Essential (primary) hypertension: Secondary | ICD-10-CM

## 2022-01-09 DIAGNOSIS — M1990 Unspecified osteoarthritis, unspecified site: Secondary | ICD-10-CM | POA: Diagnosis not present

## 2022-01-09 DIAGNOSIS — I5032 Chronic diastolic (congestive) heart failure: Secondary | ICD-10-CM

## 2022-01-09 DIAGNOSIS — I739 Peripheral vascular disease, unspecified: Secondary | ICD-10-CM | POA: Diagnosis not present

## 2022-01-09 DIAGNOSIS — R278 Other lack of coordination: Secondary | ICD-10-CM | POA: Diagnosis not present

## 2022-01-09 DIAGNOSIS — M6259 Muscle wasting and atrophy, not elsewhere classified, multiple sites: Secondary | ICD-10-CM | POA: Diagnosis not present

## 2022-01-09 DIAGNOSIS — M6281 Muscle weakness (generalized): Secondary | ICD-10-CM | POA: Diagnosis not present

## 2022-01-09 DIAGNOSIS — R2681 Unsteadiness on feet: Secondary | ICD-10-CM | POA: Diagnosis not present

## 2022-01-09 NOTE — Patient Instructions (Signed)
Medication Instructions:  ? ?INCREASE Torsemide one ( 1 ) tablet by mouth ( 20 mg ) twice daily. ? ?*If you need a refill on your cardiac medications before your next appointment, please call your pharmacy* ? ? ?Lab Work: ? ?Your physician recommends that you have lab work in: one week 3/15 BMET.  ? ? ?If you have labs (blood work) drawn today and your tests are completely normal, you will receive your results only by: ?MyChart Message (if you have MyChart) OR ?A paper copy in the mail ?If you have any lab test that is abnormal or we need to change your treatment, we will call you to review the results. ? ? ? ?Follow-Up: ?At [facility], you and your health needs are our priority.  As part of our continuing mission to provide you with exceptional heart care, we have created designated Provider Care Teams.  These Care Teams include your primary Cardiologist (physician) and Advanced Practice Providers (APPs -  Physician Assistants and Nurse Practitioners) who all work together to provide you with the care you need, when you need it. ? ?We recommend signing up for the patient portal called "MyChart".  Sign up information is provided on this After Visit Summary.  MyChart is used to connect with patients for Virtual Visits (Telemedicine).  Patients are able to view lab/test results, encounter notes, upcoming appointments, etc.  Non-urgent messages can be sent to your provider as well.   ?To learn more about what you can do with MyChart, go to [email or web address].   ? ?Your next appointment:   ?2 week(s) ? ?The format for your next appointment:   ?In Person ? ?Provider:   ?Ambrose Pancoast, NP       ? ? ?Other Instructions ? ?Weigh Daily if  weight increases 3 lbs in 24 hours please call office.  ? ?Low-Sodium Eating Plan ?Sodium, which is an element that makes up salt, helps you maintain a healthy balance of fluids in your body. Too much sodium can increase your blood pressure and cause fluid and waste to be held in  your body. ?Your health care provider or dietitian may recommend following this plan if you have high blood pressure (hypertension), kidney disease, liver disease, or heart failure. Eating less sodium can help lower your blood pressure, reduce swelling, and protect your heart, liver, and kidneys. ?What are tips for following this plan? ?Reading food labels ?The Nutrition Facts label lists the amount of sodium in one serving of the food. If you eat more than one serving, you must multiply the listed amount of sodium by the number of servings. ?Choose foods with less than 140 mg of sodium per serving. ?Avoid foods with 300 mg of sodium or more per serving. ?Shopping ? ?Look for lower-sodium products, often labeled as "low-sodium" or "no salt added." ?Always check the sodium content, even if foods are labeled as "unsalted" or "no salt added." ?Buy fresh foods. ?Avoid canned foods and pre-made or frozen meals. ?Avoid canned, cured, or processed meats. ?Buy breads that have less than 80 mg of sodium per slice. ?Cooking ? ?Eat more home-cooked food and less restaurant, buffet, and fast food. ?Avoid adding salt when cooking. Use salt-free seasonings or herbs instead of table salt or sea salt. Check with your health care provider or pharmacist before using salt substitutes. ?Cook with plant-based oils, such as canola, sunflower, or olive oil. ?Meal planning ?When eating at a restaurant, ask that your food be prepared with less salt or no  salt, if possible. Avoid dishes labeled as brined, pickled, cured, smoked, or made with soy sauce, miso, or teriyaki sauce. ?Avoid foods that contain MSG (monosodium glutamate). MSG is sometimes added to [place] food, bouillon, and some canned foods. ?Make meals that can be grilled, baked, poached, roasted, or steamed. These are generally made with less sodium. ?General information ?Most people on this plan should limit their sodium intake to 1,500-2,000 mg (milligrams) of sodium each  day. ?What foods should I eat? ?Fruits ?Fresh, frozen, or canned fruit. Fruit juice. ?Vegetables ?Fresh or frozen vegetables. "No salt added" canned vegetables. "No salt added" tomato sauce and paste. Low-sodium or reduced-sodium tomato and vegetable juice. ?Grains ?Low-sodium cereals, including oats, puffed wheat and rice, and shredded wheat. Low-sodium crackers. Unsalted rice. Unsalted pasta. Low-sodium bread. Whole-grain breads and whole-grain pasta. ?Meats and other proteins ?Fresh or frozen (no salt added) meat, poultry, seafood, and fish. Low-sodium canned tuna and salmon. Unsalted nuts. Dried peas, beans, and lentils without added salt. Unsalted canned beans. Eggs. Unsalted nut butters. ?Dairy ?Milk. Soy milk. Cheese that is naturally low in sodium, such as ricotta cheese, fresh mozzarella, or Swiss cheese. Low-sodium or reduced-sodium cheese. Cream cheese. Yogurt. ?Seasonings and condiments ?Fresh and dried herbs and spices. Salt-free seasonings. Low-sodium mustard and ketchup. Sodium-free salad dressing. Sodium-free light mayonnaise. Fresh or refrigerated horseradish. Lemon juice. Vinegar. ?Other foods ?Homemade, reduced-sodium, or low-sodium soups. Unsalted popcorn and pretzels. Low-salt or salt-free chips. ?The items listed above may not be a complete list of foods and beverages you can eat. Contact a dietitian for more information. ?What foods should I avoid? ?Vegetables ?Sauerkraut, pickled vegetables, and relishes. Olives. [place] fries. Onion rings. Regular canned vegetables (not low-sodium or reduced-sodium). Regular canned tomato sauce and paste (not low-sodium or reduced-sodium). Regular tomato and vegetable juice (not low-sodium or reduced-sodium). Frozen vegetables in sauces. ?Grains ?Instant hot cereals. Bread stuffing, pancake, and biscuit mixes. Croutons. Seasoned rice or pasta mixes. Noodle soup cups. Boxed or frozen macaroni and cheese. Regular salted crackers. Self-rising flour. ?Meats and  other proteins ?Meat or fish that is salted, canned, smoked, spiced, or pickled. Precooked or cured meat, such as sausages or meat loaves. Berniece Salines. Ham. Pepperoni. Hot dogs. Corned beef. Chipped beef. Salt pork. Jerky. Pickled herring. Anchovies and sardines. Regular canned tuna. Salted nuts. ?Dairy ?Processed cheese and cheese spreads. Hard cheeses. Cheese curds. Blue cheese. Feta cheese. String cheese. Regular cottage cheese. Buttermilk. Canned milk. ?Fats and oils ?Salted butter. Regular margarine. Ghee. Bacon fat. ?Seasonings and condiments ?Onion salt, garlic salt, seasoned salt, table salt, and sea salt. Canned and packaged gravies. Worcestershire sauce. Tartar sauce. Barbecue sauce. Teriyaki sauce. Soy sauce, including reduced-sodium. Steak sauce. Fish sauce. Oyster sauce. Cocktail sauce. Horseradish that you find on the shelf. Regular ketchup and mustard. Meat flavorings and tenderizers. Bouillon cubes. Hot sauce. Pre-made or packaged marinades. Pre-made or packaged taco seasonings. Relishes. Regular salad dressings. Salsa. ?Other foods ?Salted popcorn and pretzels. Corn chips and puffs. Potato and tortilla chips. Canned or dried soups. Pizza. Frozen entrees and pot pies. ?The items listed above may not be a complete list of foods and beverages you should avoid. Contact a dietitian for more information. ?Summary ?Eating less sodium can help lower your blood pressure, reduce swelling, and protect your heart, liver, and kidneys. ?Most people on this plan should limit their sodium intake to 1,500-2,000 mg (milligrams) of sodium each day. ?Canned, boxed, and frozen foods are high in sodium. Restaurant foods, fast foods, and pizza are also very high  in sodium. You also get sodium by adding salt to food. ?Try to cook at home, eat more fresh fruits and vegetables, and eat less fast food and canned, processed, or prepared foods. ?This information is not intended to replace advice given to you by your health care  provider. Make sure you discuss any questions you have with your health care provider. ?Document Revised: 11/26/2019 Document Reviewed: 09/22/2019 ?Elsevier Patient Education ? [place]. ?  ?

## 2022-01-10 DIAGNOSIS — R2681 Unsteadiness on feet: Secondary | ICD-10-CM | POA: Diagnosis not present

## 2022-01-10 DIAGNOSIS — M6259 Muscle wasting and atrophy, not elsewhere classified, multiple sites: Secondary | ICD-10-CM | POA: Diagnosis not present

## 2022-01-10 DIAGNOSIS — I739 Peripheral vascular disease, unspecified: Secondary | ICD-10-CM | POA: Diagnosis not present

## 2022-01-10 DIAGNOSIS — R278 Other lack of coordination: Secondary | ICD-10-CM | POA: Diagnosis not present

## 2022-01-10 DIAGNOSIS — M6281 Muscle weakness (generalized): Secondary | ICD-10-CM | POA: Diagnosis not present

## 2022-01-10 DIAGNOSIS — M1990 Unspecified osteoarthritis, unspecified site: Secondary | ICD-10-CM | POA: Diagnosis not present

## 2022-01-11 DIAGNOSIS — M6259 Muscle wasting and atrophy, not elsewhere classified, multiple sites: Secondary | ICD-10-CM | POA: Diagnosis not present

## 2022-01-11 DIAGNOSIS — R2681 Unsteadiness on feet: Secondary | ICD-10-CM | POA: Diagnosis not present

## 2022-01-11 DIAGNOSIS — M6281 Muscle weakness (generalized): Secondary | ICD-10-CM | POA: Diagnosis not present

## 2022-01-11 DIAGNOSIS — R278 Other lack of coordination: Secondary | ICD-10-CM | POA: Diagnosis not present

## 2022-01-11 DIAGNOSIS — I739 Peripheral vascular disease, unspecified: Secondary | ICD-10-CM | POA: Diagnosis not present

## 2022-01-11 DIAGNOSIS — M1990 Unspecified osteoarthritis, unspecified site: Secondary | ICD-10-CM | POA: Diagnosis not present

## 2022-01-14 DIAGNOSIS — I739 Peripheral vascular disease, unspecified: Secondary | ICD-10-CM | POA: Diagnosis not present

## 2022-01-14 DIAGNOSIS — M1990 Unspecified osteoarthritis, unspecified site: Secondary | ICD-10-CM | POA: Diagnosis not present

## 2022-01-14 DIAGNOSIS — R2681 Unsteadiness on feet: Secondary | ICD-10-CM | POA: Diagnosis not present

## 2022-01-14 DIAGNOSIS — M6281 Muscle weakness (generalized): Secondary | ICD-10-CM | POA: Diagnosis not present

## 2022-01-14 DIAGNOSIS — M6259 Muscle wasting and atrophy, not elsewhere classified, multiple sites: Secondary | ICD-10-CM | POA: Diagnosis not present

## 2022-01-14 DIAGNOSIS — R278 Other lack of coordination: Secondary | ICD-10-CM | POA: Diagnosis not present

## 2022-01-15 DIAGNOSIS — R278 Other lack of coordination: Secondary | ICD-10-CM | POA: Diagnosis not present

## 2022-01-15 DIAGNOSIS — M6281 Muscle weakness (generalized): Secondary | ICD-10-CM | POA: Diagnosis not present

## 2022-01-15 DIAGNOSIS — I739 Peripheral vascular disease, unspecified: Secondary | ICD-10-CM | POA: Diagnosis not present

## 2022-01-15 DIAGNOSIS — M6259 Muscle wasting and atrophy, not elsewhere classified, multiple sites: Secondary | ICD-10-CM | POA: Diagnosis not present

## 2022-01-15 DIAGNOSIS — M1990 Unspecified osteoarthritis, unspecified site: Secondary | ICD-10-CM | POA: Diagnosis not present

## 2022-01-15 DIAGNOSIS — R2681 Unsteadiness on feet: Secondary | ICD-10-CM | POA: Diagnosis not present

## 2022-01-16 DIAGNOSIS — D649 Anemia, unspecified: Secondary | ICD-10-CM | POA: Diagnosis not present

## 2022-01-16 DIAGNOSIS — M6281 Muscle weakness (generalized): Secondary | ICD-10-CM | POA: Diagnosis not present

## 2022-01-16 DIAGNOSIS — I739 Peripheral vascular disease, unspecified: Secondary | ICD-10-CM | POA: Diagnosis not present

## 2022-01-16 DIAGNOSIS — M6259 Muscle wasting and atrophy, not elsewhere classified, multiple sites: Secondary | ICD-10-CM | POA: Diagnosis not present

## 2022-01-16 DIAGNOSIS — R2681 Unsteadiness on feet: Secondary | ICD-10-CM | POA: Diagnosis not present

## 2022-01-16 DIAGNOSIS — M1990 Unspecified osteoarthritis, unspecified site: Secondary | ICD-10-CM | POA: Diagnosis not present

## 2022-01-16 DIAGNOSIS — R278 Other lack of coordination: Secondary | ICD-10-CM | POA: Diagnosis not present

## 2022-01-16 LAB — BASIC METABOLIC PANEL
BUN: 32 — AB (ref 4–21)
CO2: 27 — AB (ref 13–22)
Chloride: 98 — AB (ref 99–108)
Glucose: 166
Potassium: 3.7 mEq/L (ref 3.5–5.1)
Sodium: 138 (ref 137–147)

## 2022-01-16 LAB — COMPREHENSIVE METABOLIC PANEL: Calcium: 9.2 (ref 8.7–10.7)

## 2022-01-17 DIAGNOSIS — M1990 Unspecified osteoarthritis, unspecified site: Secondary | ICD-10-CM | POA: Diagnosis not present

## 2022-01-17 DIAGNOSIS — M6259 Muscle wasting and atrophy, not elsewhere classified, multiple sites: Secondary | ICD-10-CM | POA: Diagnosis not present

## 2022-01-17 DIAGNOSIS — R2681 Unsteadiness on feet: Secondary | ICD-10-CM | POA: Diagnosis not present

## 2022-01-17 DIAGNOSIS — M6281 Muscle weakness (generalized): Secondary | ICD-10-CM | POA: Diagnosis not present

## 2022-01-17 DIAGNOSIS — I739 Peripheral vascular disease, unspecified: Secondary | ICD-10-CM | POA: Diagnosis not present

## 2022-01-17 DIAGNOSIS — I10 Essential (primary) hypertension: Secondary | ICD-10-CM | POA: Diagnosis not present

## 2022-01-17 DIAGNOSIS — R278 Other lack of coordination: Secondary | ICD-10-CM | POA: Diagnosis not present

## 2022-01-17 LAB — BASIC METABOLIC PANEL
BUN: 38 — AB (ref 4–21)
CO2: 26 — AB (ref 13–22)
Chloride: 101 (ref 99–108)
Creatinine: 0.9 (ref 0.6–1.3)
Glucose: 175
Potassium: 3.8 mEq/L (ref 3.5–5.1)
Sodium: 141 (ref 137–147)

## 2022-01-17 LAB — COMPREHENSIVE METABOLIC PANEL: Calcium: 9.1 (ref 8.7–10.7)

## 2022-01-18 DIAGNOSIS — R2681 Unsteadiness on feet: Secondary | ICD-10-CM | POA: Diagnosis not present

## 2022-01-18 DIAGNOSIS — R278 Other lack of coordination: Secondary | ICD-10-CM | POA: Diagnosis not present

## 2022-01-18 DIAGNOSIS — M6281 Muscle weakness (generalized): Secondary | ICD-10-CM | POA: Diagnosis not present

## 2022-01-18 DIAGNOSIS — M1990 Unspecified osteoarthritis, unspecified site: Secondary | ICD-10-CM | POA: Diagnosis not present

## 2022-01-18 DIAGNOSIS — I739 Peripheral vascular disease, unspecified: Secondary | ICD-10-CM | POA: Diagnosis not present

## 2022-01-18 DIAGNOSIS — M6259 Muscle wasting and atrophy, not elsewhere classified, multiple sites: Secondary | ICD-10-CM | POA: Diagnosis not present

## 2022-01-21 DIAGNOSIS — M6281 Muscle weakness (generalized): Secondary | ICD-10-CM | POA: Diagnosis not present

## 2022-01-21 DIAGNOSIS — R2681 Unsteadiness on feet: Secondary | ICD-10-CM | POA: Diagnosis not present

## 2022-01-21 DIAGNOSIS — M6259 Muscle wasting and atrophy, not elsewhere classified, multiple sites: Secondary | ICD-10-CM | POA: Diagnosis not present

## 2022-01-21 DIAGNOSIS — M1990 Unspecified osteoarthritis, unspecified site: Secondary | ICD-10-CM | POA: Diagnosis not present

## 2022-01-21 DIAGNOSIS — R278 Other lack of coordination: Secondary | ICD-10-CM | POA: Diagnosis not present

## 2022-01-21 DIAGNOSIS — I739 Peripheral vascular disease, unspecified: Secondary | ICD-10-CM | POA: Diagnosis not present

## 2022-01-22 DIAGNOSIS — M6281 Muscle weakness (generalized): Secondary | ICD-10-CM | POA: Diagnosis not present

## 2022-01-22 DIAGNOSIS — I739 Peripheral vascular disease, unspecified: Secondary | ICD-10-CM | POA: Diagnosis not present

## 2022-01-22 DIAGNOSIS — R278 Other lack of coordination: Secondary | ICD-10-CM | POA: Diagnosis not present

## 2022-01-22 DIAGNOSIS — M6259 Muscle wasting and atrophy, not elsewhere classified, multiple sites: Secondary | ICD-10-CM | POA: Diagnosis not present

## 2022-01-22 DIAGNOSIS — R2681 Unsteadiness on feet: Secondary | ICD-10-CM | POA: Diagnosis not present

## 2022-01-22 DIAGNOSIS — M1990 Unspecified osteoarthritis, unspecified site: Secondary | ICD-10-CM | POA: Diagnosis not present

## 2022-01-22 NOTE — Progress Notes (Signed)
? ? ?Office Visit  ?  ?Patient Name: [name] ?Date of Encounter: 01/23/2022 ? ?Primary Care Provider:  [name], MD ?Primary Cardiologist:  [name], MD ?Primary Electrophysiologist: None ?Chief Complaint  ?  ?[name] is a 88 y.o. male presents today for 2-week follow-up for diastolic heart failure. ? ?Patient Profile: ?Aortic stenosis s/p TAVR 2014 ?HTN ?Persistent AF-not on anticoagulants due to frequent falls ?Recurrent GI Bleed requiring hospitalization on  ?PAD ?CKD stage III ?HFpEF ? ?Prior CV Studies: ?2022/12/02 2D echo completed 12/02/2022 with EF 60-65%, severely elevated pulmonary systolic pressure, dilated RA, LA, Severe MR ?10/21 Echo completed with EF 60-65%, LVH, TV regurgitation, MV regurgitation ?5/16 echo complete with EF 55-60%, mild LV H, mild MR, moderate pulmonary hypertension ? ?History of Present Illness  ?  ?[name] is a 88 y.o. male with the above problem list was last seen on 3/7 for hospital follow-up. [name] is still recovering at a SNF. Prior to his visit he was endorsing two-pillow orthopnea and shortness of breath. His torsemide was increased to 20 mg twice daily, and plan to initiate ACE inhibitor if potassium was within normal limits. ? ?Today [name] presents with his daughter in clinic.  He reports that he is feeling much better and his breathing and lower extremity weeping has improved.  He does state that when he follows down in the bed at night his shortness of breath increases, however this improves with repositioning.  On examination he was euvolemic with clear lung sounds and no shortness of breath noted.  He denies any recent hematuria or or melena.  He is currently on ASA 81 mg as his only anticoagulant due to recent GI bleed.  ? ?Past Medical History  ?  ?Past Medical History:  ?Diagnosis Date  ? Anemia   ? LOW PLATELETS OTHER DAY  PER PT  ? Anticoagulated on Coumadin 12/21/2012  ? Aortic stenosis   ? Arthritis   ? "left  wrist; back sometimes" (01/21/2013)  ? Atrial fibrillation ([place])   ? [name], DR Burt Knack  ? Bradycardia   ? Exertional shortness of breath   ? Heart murmur   ? "I've had it for years; runs in the family on daddy's side" (01/21/2013)  ? HTN (hypertension)   ? Hyperlipemia 12/21/2012  ? Prostate cancer ([place])   ? "had 40 tx of radiation in 2009" (01/21/2013)  ? S/P aortic valve replacement with bioprosthetic valve 01/19/2013  ? Transcatheter Aortic Valve Replacement using 49m Sapien bioprosthetic tissue valve via transapical approach  ? Type II diabetes mellitus ([place]   ? ?Past Surgical History:  ?Procedure Laterality Date  ? BALLOON DILATION N/A 08/02/2020  ? Procedure: BALLOON DILATION;  Surgeon: [name] MD;  [facility];  Service: Gastroenterology;  Laterality: N/A;  ? BIOPSY  10/03/2021  ? Procedure: BIOPSY;  Surgeon: [name] MD;  [facility];  Service: Endoscopy;;  ? BIOPSY  12/14/2021  ? Procedure: BIOPSY;  Surgeon: [name] MD;  [place] [facility]  Service: Endoscopy;;  ? CARDIAC CATHETERIZATION  12/16/2012  ? CARDIAC VALVE REPLACEMENT  01/19/2013  ? AVR  ? CATARACT EXTRACTION W/ INTRAOCULAR LENS  IMPLANT, BILATERAL    ? ESOPHAGOGASTRODUODENOSCOPY N/A 10/03/2021  ? Procedure: ESOPHAGOGASTRODUODENOSCOPY (EGD);  Surgeon: [name] MD;  [facility];  Service: Endoscopy;  Laterality: N/A;  ? ESOPHAGOGASTRODUODENOSCOPY N/A 12/14/2021  ? Procedure: ESOPHAGOGASTRODUODENOSCOPY (EGD);  Surgeon: [name] MD;  [place] [facility]  Service: Endoscopy;  Laterality: N/A;  ? ESOPHAGOGASTRODUODENOSCOPY (EGD) WITH PROPOFOL N/A 08/02/2020  ? Procedure: ESOPHAGOGASTRODUODENOSCOPY (EGD) WITH PROPOFOL;  Surgeon: [name], MD;  [facility];  Service: Gastroenterology;  Laterality: N/A;  ? EYE SURGERY    ? INTRAOPERATIVE TRANSESOPHAGEAL ECHOCARDIOGRAM N/A 01/19/2013  ? Procedure: INTRAOPERATIVE TRANSESOPHAGEAL ECHOCARDIOGRAM;  Surgeon: [name],  MD;  Location: [place];  Service: Open Heart Surgery;  Laterality: N/A;  ? ORIF FOREARM FRACTURE Left 1954  ? "compound fx" (01/21/2013)  ? TRANSTHORACIC ECHOCARDIOGRAM  09/04/10, 09/07/08  ? ? ?Allergies ? ?Allergies  ?Allergen Reactions  ? Amoxicillin Other (See Comments)  ?  Burning of mouth, tongue and lips. ? itchy, burning throat  ? ? ?Home Medications  ?  ?Current Outpatient Medications  ?Medication Sig Dispense Refill  ? acetaminophen (TYLENOL) 650 MG CR tablet Take 650 mg by mouth daily with breakfast.    ? Amino Acids-Protein Hydrolys (PRO-STAT) LIQD Take 30 mLs by mouth 2 (two) times daily.    ? aspirin EC 81 MG tablet Take 1 tablet (81 mg total) by mouth daily. Swallow whole. 90 tablet 0  ? B Complex Vitamins (B COMPLEX 50) TABS Take 1 tablet by mouth daily with breakfast.    ? bisacodyl (DULCOLAX) 10 MG suppository If not relieved by MOM, give 10 mg Bisacodyl suppositiory rectally X 1 dose in 24 hours as needed    ? cetirizine (ZYRTEC) 10 MG tablet Take 10 mg by mouth daily.    ? cholecalciferol (VITAMIN D3) 25 MCG (1000 UNIT) tablet Take 1,000 Units by mouth 2 (two) times daily with a meal.    ? enzalutamide (XTANDI) 80 MG tablet Take 160 mg by mouth daily. 2 pm for prostate cancer    ? ferrous sulfate 325 (65 FE) MG tablet Take 1 tablet (325 mg total) by mouth 2 (two) times daily with a meal. 180 tablet 0  ? gabapentin (NEURONTIN) 100 MG capsule Take 1 capsule (100 mg total) by mouth at bedtime. 30 capsule 0  ? Magnesium Hydroxide (MILK OF MAGNESIA PO) Take by mouth. If no BM in 3 days, give 30 cc Milk of Magnesium p.o. x 1 dose in 24 hours as needed (Do not use standing constipation orders for residents with renal failure CFR less than 30. Contact MD for orders)    ? metFORMIN (GLUCOPHAGE) 1000 MG tablet Take 1 tablet (1,000 mg total) by mouth 2 (two) times daily with breakfast and lunch. 60 tablet 0  ? Nutritional Supplements (NUTRITIONAL SUPPLEMENT PO) Take by mouth. MAGIC CUP TWICE DAILY FOR LOW  ALBUMIN/PROTEIN    ? Nystatin (GERHARDT'S BUTT CREAM) CREA Apply 1 application topically daily as needed for irritation. 1 each 0  ? OXYGEN 2L/min via [place] continuously    ? pantoprazole (PROTONIX) 40 MG tablet Take 1 tablet (40 mg total) by mouth 2 (two) times daily. 120 tablet 0  ? PSYLLIUM PO Take 1 capsule by mouth daily.    ? rosuvastatin (CRESTOR) 5 MG tablet TAKE 1 TABLET BY MOUTH ONCE DAILY AT  6PM 30 tablet 0  ? torsemide (DEMADEX) 20 MG tablet Take 1 tablet (20 mg total) by mouth 2 (two) times daily. 180 tablet 3  ? Vitamin A 2400 MCG (8000 UT) TABS Take 2,400 mcg by mouth daily with breakfast.    ? vitamin B-12 (CYANOCOBALAMIN) 1000 MCG tablet Take 1,000 mcg by mouth 2 (two) times daily with a meal.    ? vitamin C (ASCORBIC ACID) 500 MG tablet Take 500 mg by  mouth 2 (two) times daily with a meal.    ? Zinc 50 MG TABS Take 50 mg by mouth daily.    ? metoprolol succinate (TOPROL-XL) 25 MG 24 hr tablet Take 0.5 tablets (12.5 mg total) by mouth daily. 15 tablet 0  ? ?No current facility-administered medications for this visit.  ?  ? ?Review of Systems  ?Please see the history of present illness.    ? ?All other systems reviewed and are otherwise negative except as noted above. ? ?Physical Exam  ?  ?Wt Readings from Last 3 Encounters:  ?01/23/22 160 lb (72.6 kg)  ?01/09/22 168 lb 6.4 oz (76.4 kg)  ?01/07/22 171 lb (77.6 kg)  ? ?VS: ?Vitals:  ? 01/23/22 1405  ?BP: (!) 118/50  ?Pulse: 87  ?SpO2: 93%  ?,Body mass index is 24.33 kg/m?. ? ?GEN: Well nourished, well developed, in no acute distress. ?Neck: Supple, no JVD, carotid bruits, or masses. ?Cardiac: O9,B3,  holosystolic murmur, IRIR no rubs, or gallops. No clubbing, cyanosis, edema.  ?Radials/PT 2+ and equal bilaterally.  ?Respiratory:  Respirations regular and unlabored, clear to auscultation bilaterally. ?MS: no deformity or atrophy. ?Skin: warm and dry, no rash. ?Neuro:  Strength and sensation are intact. ?Psych: Normal affect. ? ?EKG/LABS/Other Studies  Reviewed  ?  ?ECG personally reviewed by me today - none today   ? ?Risk Assessment/Calculations:   ? ?CHA2DS2-VASc Score = 5  ? This indicates a 7.2% annual risk of stroke. ?The patient's score is based upon:

## 2022-01-23 ENCOUNTER — Other Ambulatory Visit: Payer: Self-pay

## 2022-01-23 ENCOUNTER — Encounter: Payer: Self-pay | Admitting: Nurse Practitioner

## 2022-01-23 ENCOUNTER — Ambulatory Visit (INDEPENDENT_AMBULATORY_CARE_PROVIDER_SITE_OTHER): Payer: Medicare Other | Admitting: Nurse Practitioner

## 2022-01-23 VITALS — BP 118/50 | HR 87 | Ht 68.0 in | Wt 160.0 lb

## 2022-01-23 DIAGNOSIS — M1990 Unspecified osteoarthritis, unspecified site: Secondary | ICD-10-CM | POA: Diagnosis not present

## 2022-01-23 DIAGNOSIS — I739 Peripheral vascular disease, unspecified: Secondary | ICD-10-CM | POA: Diagnosis not present

## 2022-01-23 DIAGNOSIS — I10 Essential (primary) hypertension: Secondary | ICD-10-CM

## 2022-01-23 DIAGNOSIS — I5032 Chronic diastolic (congestive) heart failure: Secondary | ICD-10-CM | POA: Diagnosis not present

## 2022-01-23 DIAGNOSIS — M6281 Muscle weakness (generalized): Secondary | ICD-10-CM | POA: Diagnosis not present

## 2022-01-23 DIAGNOSIS — I4819 Other persistent atrial fibrillation: Secondary | ICD-10-CM | POA: Diagnosis not present

## 2022-01-23 DIAGNOSIS — R278 Other lack of coordination: Secondary | ICD-10-CM | POA: Diagnosis not present

## 2022-01-23 DIAGNOSIS — R2681 Unsteadiness on feet: Secondary | ICD-10-CM | POA: Diagnosis not present

## 2022-01-23 DIAGNOSIS — M6259 Muscle wasting and atrophy, not elsewhere classified, multiple sites: Secondary | ICD-10-CM | POA: Diagnosis not present

## 2022-01-23 MED ORDER — TORSEMIDE 20 MG PO TABS
20.0000 mg | ORAL_TABLET | Freq: Two times a day (BID) | ORAL | 3 refills | Status: DC
Start: 2022-01-23 — End: 2022-01-31

## 2022-01-23 NOTE — Patient Instructions (Addendum)
Medication Instructions:  ?Your physician has recommended you make the following change in your medication:  ? CONTINUE the Torsemide 20 mg taking twice a day ? ? ?*If you need a refill on your cardiac medications before your next appointment, please call your pharmacy* ? ? ?Lab Work: ?TODAY:  BMET & CBC ? ?If you have labs (blood work) drawn today and your tests are completely normal, you will receive your results only by: ?MyChart Message (if you have MyChart) OR ?A paper copy in the mail ?If you have any lab test that is abnormal or we need to change your treatment, we will call you to review the results. ? ? ?Testing/Procedures: ?None ordered ? ? ?Follow-Up: ?At [facility], you and your health needs are our priority.  As part of our continuing mission to provide you with exceptional heart care, we have created designated Provider Care Teams.  These Care Teams include your primary Cardiologist (physician) and Advanced Practice Providers (APPs -  Physician Assistants and Nurse Practitioners) who all work together to provide you with the care you need, when you need it. ? ?We recommend signing up for the patient portal called "MyChart".  Sign up information is provided on this After Visit Summary.  MyChart is used to connect with patients for Virtual Visits (Telemedicine).  Patients are able to view lab/test results, encounter notes, upcoming appointments, etc.  Non-urgent messages can be sent to your provider as well.   ?To learn more about what you can do with MyChart, go to [email or web address].   ? ?Your next appointment:   ?1 month(s) ? ?The format for your next appointment:   ?In Person ? ?Provider:   ?  ? ? ?Other Instructions ? ? ?

## 2022-01-24 DIAGNOSIS — R2681 Unsteadiness on feet: Secondary | ICD-10-CM | POA: Diagnosis not present

## 2022-01-24 DIAGNOSIS — M6259 Muscle wasting and atrophy, not elsewhere classified, multiple sites: Secondary | ICD-10-CM | POA: Diagnosis not present

## 2022-01-24 DIAGNOSIS — R278 Other lack of coordination: Secondary | ICD-10-CM | POA: Diagnosis not present

## 2022-01-24 DIAGNOSIS — M6281 Muscle weakness (generalized): Secondary | ICD-10-CM | POA: Diagnosis not present

## 2022-01-24 DIAGNOSIS — I739 Peripheral vascular disease, unspecified: Secondary | ICD-10-CM | POA: Diagnosis not present

## 2022-01-24 DIAGNOSIS — M1990 Unspecified osteoarthritis, unspecified site: Secondary | ICD-10-CM | POA: Diagnosis not present

## 2022-01-24 LAB — BASIC METABOLIC PANEL
BUN/Creatinine Ratio: 34 — ABNORMAL HIGH (ref 10–24)
BUN: 33 mg/dL — ABNORMAL HIGH (ref 8–27)
CO2: 24 mmol/L (ref 20–29)
Calcium: 8.7 mg/dL (ref 8.6–10.2)
Chloride: 98 mmol/L (ref 96–106)
Creatinine, Ser: 0.97 mg/dL (ref 0.76–1.27)
Glucose: 197 mg/dL — ABNORMAL HIGH (ref 70–99)
Potassium: 4.1 mmol/L (ref 3.5–5.2)
Sodium: 142 mmol/L (ref 134–144)
eGFR: 76 mL/min/{1.73_m2} (ref >59)

## 2022-01-24 LAB — CBC
Hematocrit: 32.3 % — ABNORMAL LOW (ref 37.5–51.0)
Hemoglobin: 10.5 g/dL — ABNORMAL LOW (ref 13.0–17.7)
MCH: 31.7 pg (ref 26.6–33.0)
MCHC: 32.5 g/dL (ref 31.5–35.7)
MCV: 98 fL — ABNORMAL HIGH (ref 79–97)
Platelets: 232 10*3/uL (ref 150–450)
RBC: 3.31 x10E6/uL — ABNORMAL LOW (ref 4.14–5.80)
RDW: 15.5 % — ABNORMAL HIGH (ref 11.6–15.4)
WBC: 4.8 10*3/uL (ref 3.4–10.8)

## 2022-01-25 ENCOUNTER — Non-Acute Institutional Stay (SKILLED_NURSING_FACILITY): Payer: Medicare Other | Admitting: Adult Health

## 2022-01-25 ENCOUNTER — Encounter: Payer: Self-pay | Admitting: Adult Health

## 2022-01-25 DIAGNOSIS — E1149 Type 2 diabetes mellitus with other diabetic neurological complication: Secondary | ICD-10-CM | POA: Diagnosis not present

## 2022-01-25 DIAGNOSIS — R278 Other lack of coordination: Secondary | ICD-10-CM | POA: Diagnosis not present

## 2022-01-25 DIAGNOSIS — I739 Peripheral vascular disease, unspecified: Secondary | ICD-10-CM | POA: Diagnosis not present

## 2022-01-25 DIAGNOSIS — M6259 Muscle wasting and atrophy, not elsewhere classified, multiple sites: Secondary | ICD-10-CM | POA: Diagnosis not present

## 2022-01-25 DIAGNOSIS — R2681 Unsteadiness on feet: Secondary | ICD-10-CM | POA: Diagnosis not present

## 2022-01-25 DIAGNOSIS — M1990 Unspecified osteoarthritis, unspecified site: Secondary | ICD-10-CM | POA: Diagnosis not present

## 2022-01-25 DIAGNOSIS — G629 Polyneuropathy, unspecified: Secondary | ICD-10-CM | POA: Diagnosis not present

## 2022-01-25 DIAGNOSIS — J302 Other seasonal allergic rhinitis: Secondary | ICD-10-CM

## 2022-01-25 DIAGNOSIS — M6281 Muscle weakness (generalized): Secondary | ICD-10-CM | POA: Diagnosis not present

## 2022-01-25 NOTE — Progress Notes (Signed)
? ?Location:  [facility] ?Nursing Home Room Number: 301-S ?Place of Service:  SNF (31) ?Provider:  Durenda Age, DNP, FNP-BC ? ?Patient Care Team: ?[name], MD as PCP - General (Family Medicine) ?[name], MD as PCP - Cardiology (Cardiology) ?Sherren Mocha, MD as Attending Physician (Cardiology) ?[name], MD (Inactive) as Attending Physician (Cardiothoracic Surgery) ?[name], MD (Inactive) as Attending Physician (Cardiothoracic Surgery) ? ?Extended Emergency Contact Information ?Primary Emergency Contact: Dennis,Sherrie Ann ?         [place], [place] [address] [place] ?Home Phone: [phone] ?Mobile Phone: [phone] ?Relation: Daughter ?Secondary Emergency Contact: Andriy, Sherk ?Address: [address]         [place],  [address] [place] ?Home Phone: [phone] ?Relation: Spouse ? ?Code Status:  Full Code ? ?Goals of care: Advanced Directive information ? ?  01/25/2022  ? 11:42 AM  ?Advanced Directives  ?Does Patient Have a Medical Advance Directive? No  ?Would patient like information on creating a medical advance directive? No - Patient declined  ? ? ? ?Chief Complaint  ?Patient presents with  ? Acute Visit  ?  Medication management   ? ? ?HPI:  ?Pt is a 88 y.o. male seen today for medical management of chronic diseases. He is a short-term care resident of [facility]. He has a PMH of coronary artery calcification, severe aortic stenosis S/P TAVR, HFpEF, mitral regurgitation, moderate tricuspid regurgitation, type 2 diabetes mellitus, essential hypertension, persistent atrial fibrillation not on anticoagulant, peripheral artery disease, chronic kidney disease stage IIIb, prior GI bleed with gastric ulcer. ? ?Staff reported that daughter has been bringing medications to facility and patient has been taking it without notifying charge nurse. Daughter brought Flonase nasal spray. CBGs  ranging from 142-221, with outlier 301.  He takes metformin 1000 mg twice a day for diabetes mellitus.  He takes gabapentin 100 g 1 capsule at bedtime for neuropathy.  He denies pain on lower extremities. ? ? ?Past Medical History:  ?Diagnosis Date  ? Anemia   ? LOW PLATELETS OTHER DAY  PER PT  ? Anticoagulated on Coumadin 12/21/2012  ? Aortic stenosis   ? Arthritis   ? "left wrist; back sometimes" (01/21/2013)  ? Atrial fibrillation ([place])   ? [name], DR Burt Knack  ? Bradycardia   ? Exertional shortness of breath   ? Heart murmur   ? "I've had it for years; runs in the family on daddy's side" (01/21/2013)  ? HTN (hypertension)   ? Hyperlipemia 12/21/2012  ? Prostate cancer ([place])   ? "had 40 tx of radiation in 2009" (01/21/2013)  ? S/P aortic valve replacement with bioprosthetic valve 01/19/2013  ? Transcatheter Aortic Valve Replacement using 36m Sapien bioprosthetic tissue valve via transapical approach  ? Type II diabetes mellitus ([place]   ? ?Past Surgical History:  ?Procedure Laterality Date  ? BALLOON DILATION N/A 08/02/2020  ? Procedure: BALLOON DILATION;  Surgeon: [name] MD;  [facility];  Service: Gastroenterology;  Laterality: N/A;  ? BIOPSY  10/03/2021  ? Procedure: BIOPSY;  Surgeon: [name] MD;  [facility];  Service: Endoscopy;;  ? BIOPSY  12/14/2021  ? Procedure: BIOPSY;  Surgeon: [name] MD;  [place]  Service: Endoscopy;;  ? CARDIAC CATHETERIZATION  12/16/2012  ? CARDIAC VALVE REPLACEMENT  01/19/2013  ? AVR  ? CATARACT EXTRACTION W/ INTRAOCULAR LENS  IMPLANT, BILATERAL    ? ESOPHAGOGASTRODUODENOSCOPY N/A 10/03/2021  ? Procedure: ESOPHAGOGASTRODUODENOSCOPY (  EGD);  Surgeon: [name], MD;  [facility];  Service: Endoscopy;  Laterality: N/A;  ? ESOPHAGOGASTRODUODENOSCOPY N/A 12/14/2021  ? Procedure: ESOPHAGOGASTRODUODENOSCOPY (EGD);  Surgeon: Wilford Corner, MD;  Location: [place];  Service: Endoscopy;  Laterality: N/A;  ?  ESOPHAGOGASTRODUODENOSCOPY (EGD) WITH PROPOFOL N/A 08/02/2020  ? Procedure: ESOPHAGOGASTRODUODENOSCOPY (EGD) WITH PROPOFOL;  Surgeon: [name], MD;  [facility];  Service: Gastroenterology;  Laterality: N/A;  ? EYE SURGERY    ? INTRAOPERATIVE TRANSESOPHAGEAL ECHOCARDIOGRAM N/A 01/19/2013  ? Procedure: INTRAOPERATIVE TRANSESOPHAGEAL ECHOCARDIOGRAM;  Surgeon: [name], MD;  Location: [place];  Service: Open Heart Surgery;  Laterality: N/A;  ? ORIF FOREARM FRACTURE Left 1954  ? "compound fx" (01/21/2013)  ? TRANSTHORACIC ECHOCARDIOGRAM  09/04/10, 09/07/08  ? ? ?Allergies  ?Allergen Reactions  ? Amoxicillin Other (See Comments)  ?  Burning of mouth, tongue and lips. ? itchy, burning throat  ? ? ?Outpatient Encounter Medications as of 01/25/2022  ?Medication Sig  ? acetaminophen (TYLENOL) 650 MG CR tablet Take 650 mg by mouth daily with breakfast.  ? Amino Acids-Protein Hydrolys (PRO-STAT) LIQD Take 30 mLs by mouth 2 (two) times daily.  ? aspirin EC 81 MG tablet Take 1 tablet (81 mg total) by mouth daily. Swallow whole.  ? B Complex Vitamins (B COMPLEX 50) TABS Take 1 tablet by mouth daily with breakfast.  ? bisacodyl (DULCOLAX) 10 MG suppository If not relieved by MOM, give 10 mg Bisacodyl suppositiory rectally X 1 dose in 24 hours as needed  ? cetirizine (ZYRTEC) 10 MG tablet Take 10 mg by mouth daily.  ? cholecalciferol (VITAMIN D3) 25 MCG (1000 UNIT) tablet Take 1,000 Units by mouth 2 (two) times daily with a meal.  ? enzalutamide (XTANDI) 80 MG tablet Take 160 mg by mouth daily. 2 pm for prostate cancer  ? ferrous sulfate 325 (65 FE) MG tablet Take 1 tablet (325 mg total) by mouth 2 (two) times daily with a meal.  ? gabapentin (NEURONTIN) 100 MG capsule Take 1 capsule (100 mg total) by mouth at bedtime.  ? Magnesium Hydroxide (MILK OF MAGNESIA PO) Take by mouth. If no BM in 3 days, give 30 cc Milk of Magnesium p.o. x 1 dose in 24 hours as needed (Do not use standing constipation orders for residents  with renal failure CFR less than 30. Contact MD for orders)  ? metFORMIN (GLUCOPHAGE) 1000 MG tablet Take 1 tablet (1,000 mg total) by mouth 2 (two) times daily with breakfast and lunch.  ? Nutritional Supplements (NUTRITIONAL SUPPLEMENT PO) Take by mouth. MAGIC CUP TWICE DAILY FOR LOW ALBUMIN/PROTEIN  ? Nystatin (GERHARDT'S BUTT CREAM) CREA Apply 1 application topically daily as needed for irritation.  ? OXYGEN 2L/min via [place] continuously  ? pantoprazole (PROTONIX) 40 MG tablet Take 1 tablet (40 mg total) by mouth 2 (two) times daily. (Patient taking differently: Take 40 mg by mouth 2 (two) times daily. TAB TAKE 1 TABLET BY MOUTH ONCE DAILY)  ? PSYLLIUM PO Take 1 capsule by mouth daily.  ? rosuvastatin (CRESTOR) 5 MG tablet TAKE 1 TABLET BY MOUTH ONCE DAILY AT  6PM  ? Sodium Phosphates (RA SALINE ENEMA RE) Place rectally. If not relieved by Biscodyl suppository, give disposable Saline Enema rectally X 1 dose/24 hrs as needed  ? torsemide (DEMADEX) 20 MG tablet Take 1 tablet (20 mg total) by mouth 2 (two) times daily.  ? Vitamin A 2400 MCG (8000 UT) TABS Take 2,400 mcg by mouth daily with breakfast.  ? vitamin  B-12 (CYANOCOBALAMIN) 1000 MCG tablet Take 1,000 mcg by mouth 2 (two) times daily with a meal.  ? vitamin C (ASCORBIC ACID) 500 MG tablet Take 500 mg by mouth 2 (two) times daily with a meal.  ? metoprolol succinate (TOPROL-XL) 25 MG 24 hr tablet Take 0.5 tablets (12.5 mg total) by mouth daily.  ? Zinc 50 MG TABS Take 50 mg by mouth daily.  ? ?No facility-administered encounter medications on file as of 01/25/2022.  ? ? ?Review of Systems  ?Constitutional:  Negative for activity change, appetite change and fever.  ?HENT:  Negative for sore throat.   ?Eyes: Negative.   ?Cardiovascular:  Positive for leg swelling. Negative for chest pain.  ?Gastrointestinal:  Negative for abdominal distention, diarrhea and vomiting.  ?Genitourinary:  Negative for dysuria, frequency and urgency.  ?Skin:  Negative for color change.   ?Neurological:  Negative for dizziness and headaches.  ?Psychiatric/Behavioral:  Negative for behavioral problems and sleep disturbance. The patient is not nervous/anxious.    ? ? ? ?Immunization History  ?Administered

## 2022-01-27 DIAGNOSIS — M6281 Muscle weakness (generalized): Secondary | ICD-10-CM | POA: Diagnosis not present

## 2022-01-27 DIAGNOSIS — R2681 Unsteadiness on feet: Secondary | ICD-10-CM | POA: Diagnosis not present

## 2022-01-27 DIAGNOSIS — M1990 Unspecified osteoarthritis, unspecified site: Secondary | ICD-10-CM | POA: Diagnosis not present

## 2022-01-27 DIAGNOSIS — I739 Peripheral vascular disease, unspecified: Secondary | ICD-10-CM | POA: Diagnosis not present

## 2022-01-27 DIAGNOSIS — M6259 Muscle wasting and atrophy, not elsewhere classified, multiple sites: Secondary | ICD-10-CM | POA: Diagnosis not present

## 2022-01-27 DIAGNOSIS — R278 Other lack of coordination: Secondary | ICD-10-CM | POA: Diagnosis not present

## 2022-01-28 ENCOUNTER — Telehealth: Payer: Self-pay

## 2022-01-28 DIAGNOSIS — M6259 Muscle wasting and atrophy, not elsewhere classified, multiple sites: Secondary | ICD-10-CM | POA: Diagnosis not present

## 2022-01-28 DIAGNOSIS — M1990 Unspecified osteoarthritis, unspecified site: Secondary | ICD-10-CM | POA: Diagnosis not present

## 2022-01-28 DIAGNOSIS — M6281 Muscle weakness (generalized): Secondary | ICD-10-CM | POA: Diagnosis not present

## 2022-01-28 DIAGNOSIS — R278 Other lack of coordination: Secondary | ICD-10-CM | POA: Diagnosis not present

## 2022-01-28 DIAGNOSIS — R2681 Unsteadiness on feet: Secondary | ICD-10-CM | POA: Diagnosis not present

## 2022-01-28 DIAGNOSIS — I739 Peripheral vascular disease, unspecified: Secondary | ICD-10-CM | POA: Diagnosis not present

## 2022-01-28 MED ORDER — LISINOPRIL 5 MG PO TABS: 5.0000 mg | ORAL_TABLET | Freq: Every day | ORAL | 3 refills | Status: DC

## 2022-01-28 NOTE — Telephone Encounter (Signed)
-----   Message from Tempie Donning, NP sent at 01/28/2022 11:12 AM EDT ----- ?Good morning, ? ?Mr. Fahringer your renal function and blood counts are stable.  Please start lisinopril 5 mg and BMET in 2 weeks. ? ?Thank you ?Ambrose Pancoast, NP ?

## 2022-01-28 NOTE — Telephone Encounter (Signed)
The patient has been notified of the result and verbalized understanding.  All questions (if any) were answered. ?Antonieta Iba, RN 01/28/2022 12:16 PM  ?The patient is currently at [facility].  ?Spoke with the patient's nurse and she advised me to fax over the prescription to [phone]/  ?

## 2022-01-29 DIAGNOSIS — R278 Other lack of coordination: Secondary | ICD-10-CM | POA: Diagnosis not present

## 2022-01-29 DIAGNOSIS — M1990 Unspecified osteoarthritis, unspecified site: Secondary | ICD-10-CM | POA: Diagnosis not present

## 2022-01-29 DIAGNOSIS — M6281 Muscle weakness (generalized): Secondary | ICD-10-CM | POA: Diagnosis not present

## 2022-01-29 DIAGNOSIS — M6259 Muscle wasting and atrophy, not elsewhere classified, multiple sites: Secondary | ICD-10-CM | POA: Diagnosis not present

## 2022-01-29 DIAGNOSIS — R2681 Unsteadiness on feet: Secondary | ICD-10-CM | POA: Diagnosis not present

## 2022-01-29 DIAGNOSIS — I739 Peripheral vascular disease, unspecified: Secondary | ICD-10-CM | POA: Diagnosis not present

## 2022-01-29 MED ORDER — LISINOPRIL 5 MG PO TABS: 5.0000 mg | ORAL_TABLET | Freq: Every day | ORAL | 3 refills | Status: DC

## 2022-01-29 NOTE — Telephone Encounter (Signed)
[facility] and confirmed fax number of [phone].  ?Fax has been resent.  ?

## 2022-01-29 NOTE — Telephone Encounter (Signed)
Calling to say that they didn't received the fax. Asking that our office refax that information. Please advise ?

## 2022-01-29 NOTE — Addendum Note (Signed)
Addended by: Antonieta Iba on: 01/29/2022 09:13 AM ? ? Modules accepted: Orders ? ?

## 2022-01-30 ENCOUNTER — Encounter: Payer: Self-pay | Admitting: Adult Health

## 2022-01-30 ENCOUNTER — Non-Acute Institutional Stay (SKILLED_NURSING_FACILITY): Payer: Medicare Other | Admitting: Adult Health

## 2022-01-30 DIAGNOSIS — C61 Malignant neoplasm of prostate: Secondary | ICD-10-CM

## 2022-01-30 DIAGNOSIS — R531 Weakness: Secondary | ICD-10-CM

## 2022-01-30 DIAGNOSIS — M1990 Unspecified osteoarthritis, unspecified site: Secondary | ICD-10-CM | POA: Diagnosis not present

## 2022-01-30 DIAGNOSIS — M6281 Muscle weakness (generalized): Secondary | ICD-10-CM | POA: Diagnosis not present

## 2022-01-30 DIAGNOSIS — I739 Peripheral vascular disease, unspecified: Secondary | ICD-10-CM | POA: Diagnosis not present

## 2022-01-30 DIAGNOSIS — A048 Other specified bacterial intestinal infections: Secondary | ICD-10-CM

## 2022-01-30 DIAGNOSIS — I482 Chronic atrial fibrillation, unspecified: Secondary | ICD-10-CM | POA: Diagnosis not present

## 2022-01-30 DIAGNOSIS — H6121 Impacted cerumen, right ear: Secondary | ICD-10-CM

## 2022-01-30 DIAGNOSIS — E782 Mixed hyperlipidemia: Secondary | ICD-10-CM | POA: Diagnosis not present

## 2022-01-30 DIAGNOSIS — R278 Other lack of coordination: Secondary | ICD-10-CM | POA: Diagnosis not present

## 2022-01-30 DIAGNOSIS — K254 Chronic or unspecified gastric ulcer with hemorrhage: Secondary | ICD-10-CM

## 2022-01-30 DIAGNOSIS — M6259 Muscle wasting and atrophy, not elsewhere classified, multiple sites: Secondary | ICD-10-CM | POA: Diagnosis not present

## 2022-01-30 DIAGNOSIS — E1149 Type 2 diabetes mellitus with other diabetic neurological complication: Secondary | ICD-10-CM

## 2022-01-30 DIAGNOSIS — I5032 Chronic diastolic (congestive) heart failure: Secondary | ICD-10-CM

## 2022-01-30 DIAGNOSIS — D62 Acute posthemorrhagic anemia: Secondary | ICD-10-CM | POA: Diagnosis not present

## 2022-01-30 DIAGNOSIS — J301 Allergic rhinitis due to pollen: Secondary | ICD-10-CM

## 2022-01-30 DIAGNOSIS — R2681 Unsteadiness on feet: Secondary | ICD-10-CM | POA: Diagnosis not present

## 2022-01-30 MED ORDER — CARBAMIDE PEROXIDE 6.5 % OT SOLN: 6.0000 [drp] | Freq: Two times a day (BID) | OTIC | 0 refills | Status: DC

## 2022-01-30 NOTE — Progress Notes (Signed)
? ?Location:  [facility] ?Nursing Home Room Number: 606 A ?Place of Service:  SNF (31) ?Provider:  Durenda Age, DNP, FNP-BC ? ?Patient Care Team: ?[name], MD as PCP - General (Family Medicine) ?[name], MD as PCP - Cardiology (Cardiology) ?Sherren Mocha, MD as Attending Physician (Cardiology) ?[name], MD (Inactive) as Attending Physician (Cardiothoracic Surgery) ?[name], MD (Inactive) as Attending Physician (Cardiothoracic Surgery) ? ?Extended Emergency Contact Information ?Primary Emergency Contact: Dennis,Sherrie Ann ?         [place], [place] [address] [place] ?Home Phone: [phone] ?Mobile Phone: [phone] ?Relation: Daughter ?Secondary Emergency Contact: Jacobey, Gura ?Address: [address]         [place], [place] [address] [place] ?Home Phone: [phone] ?Relation: Spouse ? ?Code Status:  Full Code ? ?Goals of care: Advanced Directive information ? ?  01/30/2022  ? 10:51 AM  ?Advanced Directives  ?Does Patient Have a Medical Advance Directive? No  ?Would patient like information on creating a medical advance directive? No - Patient declined  ? ? ? ?Chief Complaint  ?Patient presents with  ? Discharge Note  ?  Discharge from SNF  ? ? ?HPI:  ?Pt is a 88 y.o. male seen today for discharge home on 01/31/22 with Home health PT, OT and Nursing. ? ?He was admitted to [facility] on 12/18/2021 post hospital admission 12/13/21 to 12/18/2021.  He has a PMH of coronary artery calcification, severe aortic stenosis S/P TAVR, HFpEF, moderate mitral regurgitation, moderate tricuspid regurgitation, type 2 diabetes mellitus, essential hypertension, persistent atrial fibrillation not on anticoagulation, peripheral artery disease, chronic kidney disease  stage IIIb, prior GI bleed with gastric ulcers and previous untreated H. pylori infection.  He presented to [facility] ED with complaints of  generalized weakness and s/p fall night before presentation.  Work-up revealed hemoglobin of 6.5K and positive FOBT.  He had transfusion of 1 unit PRBC.  GI was consulted and had EGD on 12/14/2021 which showed multiple gastric ulcers.  GI recommended twice daily PPI x2 months then followed by daily indefinitely.  He had a history of H. pylori without documentation of being treated in the past.  He was a started on H. pylori antibiotic therapy.  Hospitalization was complicated by A-fib with RVR and was restarted on lower home dose of Toprol-XL to avoid hypotension.  GI bleed resolved and GI signed off on 12/17/2021. ? ?Patient was admitted to this facility for short-term rehabilitation after the patient's recent hospitalization.  Patient has completed SNF rehabilitation and therapy has cleared the patient for discharge. ? ? ?Past Medical History:  ?Diagnosis Date  ? Anemia   ? LOW PLATELETS OTHER DAY  PER PT  ? Anticoagulated on Coumadin 12/21/2012  ? Aortic stenosis   ? Arthritis   ? "left wrist; back sometimes" (01/21/2013)  ? Atrial fibrillation ([place])   ? [name], DR Burt Knack  ? Bradycardia   ? Exertional shortness of breath   ? Heart murmur   ? "I've had it for years; runs in the family on daddy's side" (01/21/2013)  ? HTN (hypertension)   ? Hyperlipemia 12/21/2012  ? Prostate cancer ([place])   ? "had 40 tx of radiation in 2009" (01/21/2013)  ? S/P aortic valve replacement with bioprosthetic valve 01/19/2013  ? Transcatheter Aortic Valve Replacement using 38m Sapien bioprosthetic tissue valve via transapical approach  ? Type II diabetes mellitus ([place]   ? ?Past Surgical History:  ?Procedure Laterality  Date  ? BALLOON DILATION N/A 08/02/2020  ? Procedure: BALLOON DILATION;  Surgeon: [name], MD;  Location: [facility];  Service: Gastroenterology;  Laterality: N/A;  ? BIOPSY  10/03/2021  ? Procedure: BIOPSY;  Surgeon: [name], MD;  Location: [facility];  Service: Endoscopy;;  ? BIOPSY  12/14/2021  ? Procedure:  BIOPSY;  Surgeon: Wilford Corner, MD;  Location: [facility];  Service: Endoscopy;;  ? CARDIAC CATHETERIZATION  12/16/2012  ? CARDIAC VALVE REPLACEMENT  01/19/2013  ? AVR  ? CATARACT EXTRACTION W/ INTRAOCULAR LENS  IMPLANT, BILATERAL    ? ESOPHAGOGASTRODUODENOSCOPY N/A 10/03/2021  ? Procedure: ESOPHAGOGASTRODUODENOSCOPY (EGD);  Surgeon: [name], MD;  Location: [facility];  Service: Endoscopy;  Laterality: N/A;  ? ESOPHAGOGASTRODUODENOSCOPY N/A 12/14/2021  ? Procedure: ESOPHAGOGASTRODUODENOSCOPY (EGD);  Surgeon: Wilford Corner, MD;  Location: [facility];  Service: Endoscopy;  Laterality: N/A;  ? ESOPHAGOGASTRODUODENOSCOPY (EGD) WITH PROPOFOL N/A 08/02/2020  ? Procedure: ESOPHAGOGASTRODUODENOSCOPY (EGD) WITH PROPOFOL;  Surgeon: [name], MD;  [facility];  Service: Gastroenterology;  Laterality: N/A;  ? EYE SURGERY    ? INTRAOPERATIVE TRANSESOPHAGEAL ECHOCARDIOGRAM N/A 01/19/2013  ? Procedure: INTRAOPERATIVE TRANSESOPHAGEAL ECHOCARDIOGRAM;  Surgeon: [name], MD;  Location: [facility];  Service: Open Heart Surgery;  Laterality: N/A;  ? ORIF FOREARM FRACTURE Left 1954  ? "compound fx" (01/21/2013)  ? TRANSTHORACIC ECHOCARDIOGRAM  09/04/10, 09/07/08  ? ? ?Allergies  ?Allergen Reactions  ? Amoxicillin Other (See Comments)  ?  Burning of mouth, tongue and lips. ? itchy, burning throat  ? ? ?Outpatient Encounter Medications as of 01/30/2022  ?Medication Sig  ? acetaminophen (TYLENOL) 650 MG CR tablet Take 650 mg by mouth daily with breakfast.  ? Amino Acids-Protein Hydrolys (PRO-STAT) LIQD Take 30 mLs by mouth 2 (two) times daily.  ? aspirin EC 81 MG tablet Take 1 tablet (81 mg total) by mouth daily. Swallow whole.  ? B Complex Vitamins (B COMPLEX 50) TABS Take 1 tablet by mouth daily with breakfast.  ? bisacodyl (DULCOLAX) 10 MG suppository If not relieved by MOM, give 10 mg Bisacodyl suppositiory rectally X 1 dose in 24 hours as needed  ? cetirizine (ZYRTEC) 10 MG tablet Take 10 mg by mouth daily.   ? cholecalciferol (VITAMIN D3) 25 MCG (1000 UNIT) tablet Take 1,000 Units by mouth 2 (two) times daily with a meal.  ? enzalutamide (XTANDI) 80 MG tablet Take 160 mg by mouth daily. 2 pm for prostate cancer  ? ferrous sulfate 325 (65 FE) MG tablet Take 1 tablet (325 mg total) by mouth 2 (two) times daily with a meal.  ? fluticasone (FLONASE SENSIMIST) 27.5 MCG/SPRAY nasal spray Place 2 sprays into the nose daily.  ? [START ON 02/02/2022] fluticasone (FLONASE) 50 MCG/ACT nasal spray Place 1 spray into both nostrils daily.  ? gabapentin (NEURONTIN) 100 MG capsule Take 1 capsule (100 mg total) by mouth at bedtime.  ? lisinopril (ZESTRIL) 5 MG tablet Take 1 tablet (5 mg total) by mouth daily.  ? Magnesium Hydroxide (MILK OF MAGNESIA PO) Take by mouth. If no BM in 3 days, give 30 cc Milk of Magnesium p.o. x 1 dose in 24 hours as needed (Do not use standing constipation orders for residents with renal failure CFR less than 30. Contact MD for orders)  ? metFORMIN (GLUCOPHAGE) 1000 MG tablet Take 1 tablet (1,000 mg total) by mouth 2 (two) times daily with breakfast and lunch.  ? Nutritional Supplements (NUTRITIONAL SUPPLEMENT PO) Take by mouth. MAGIC CUP TWICE DAILY FOR LOW  ALBUMIN/PROTEIN  ? Nystatin (GERHARDT'S BUTT CREAM) CREA Apply 1 application topically daily as needed for irritation.  ? OXYGEN 2L/min via [place] continuously  ? pantoprazole (PROTONIX) 40 MG tablet Take 40 mg by mouth 2 (two) times daily.  ? [START ON 02/17/2022] pantoprazole (PROTONIX) 40 MG tablet Take 40 mg by mouth daily.  ? PSYLLIUM PO Take 1 capsule by mouth daily.  ? rosuvastatin (CRESTOR) 5 MG tablet TAKE 1 TABLET BY MOUTH ONCE DAILY AT  6PM  ? Sodium Phosphates (RA SALINE ENEMA RE) Place rectally. If not relieved by Biscodyl suppository, give disposable Saline Enema rectally X 1 dose/24 hrs as needed  ? torsemide (DEMADEX) 20 MG tablet Take 1 tablet (20 mg total) by mouth 2 (two) times daily.  ? Vitamin A 2400 MCG (8000 UT) TABS Take 2,400 mcg by  mouth daily with breakfast.  ? vitamin B-12 (CYANOCOBALAMIN) 1000 MCG tablet Take 1,000 mcg by mouth 2 (two) times daily with a meal.  ? vitamin C (ASCORBIC ACID) 500 MG tablet Take 500 mg by mouth 2 (two) t

## 2022-01-31 MED ORDER — GABAPENTIN 100 MG PO CAPS: 100.0000 mg | ORAL_CAPSULE | Freq: Every day | ORAL | 0 refills | Status: DC

## 2022-01-31 MED ORDER — CARBAMIDE PEROXIDE 6.5 % OT SOLN: 6.0000 [drp] | Freq: Two times a day (BID) | OTIC | 0 refills | Status: AC

## 2022-01-31 MED ORDER — PANTOPRAZOLE SODIUM 40 MG PO TBEC: 40.0000 mg | DELAYED_RELEASE_TABLET | Freq: Every day | ORAL | 0 refills | Status: DC

## 2022-01-31 MED ORDER — ENZALUTAMIDE 80 MG PO TABS: 160.0000 mg | ORAL_TABLET | Freq: Every day | ORAL | 0 refills | Status: DC

## 2022-01-31 MED ORDER — VITAMIN B-12 1000 MCG PO TABS: 1000.0000 ug | ORAL_TABLET | Freq: Two times a day (BID) | ORAL | 0 refills | Status: DC

## 2022-01-31 MED ORDER — METOPROLOL SUCCINATE ER 25 MG PO TB24: 12.5000 mg | ORAL_TABLET | Freq: Every day | ORAL | 0 refills | Status: DC

## 2022-01-31 MED ORDER — FERROUS SULFATE 325 (65 FE) MG PO TABS: 325.0000 mg | ORAL_TABLET | Freq: Two times a day (BID) | ORAL | 0 refills | Status: DC

## 2022-01-31 MED ORDER — METFORMIN HCL 1000 MG PO TABS: 1000.0000 mg | ORAL_TABLET | Freq: Two times a day (BID) | ORAL | 0 refills | Status: DC

## 2022-01-31 MED ORDER — ROSUVASTATIN CALCIUM 5 MG PO TABS: ORAL_TABLET | ORAL | 0 refills | Status: DC

## 2022-01-31 MED ORDER — CETIRIZINE HCL 10 MG PO TABS: 10.0000 mg | ORAL_TABLET | Freq: Every day | ORAL | 0 refills | Status: DC

## 2022-01-31 MED ORDER — FLUTICASONE PROPIONATE 50 MCG/ACT NA SUSP: 1.0000 | Freq: Every day | NASAL | 0 refills | Status: DC

## 2022-01-31 MED ORDER — TORSEMIDE 20 MG PO TABS: 20.0000 mg | ORAL_TABLET | Freq: Two times a day (BID) | ORAL | 0 refills | Status: DC

## 2022-01-31 MED ORDER — PANTOPRAZOLE SODIUM 40 MG PO TBEC: 40.0000 mg | DELAYED_RELEASE_TABLET | Freq: Two times a day (BID) | ORAL | 0 refills | Status: DC

## 2022-01-31 MED ORDER — LISINOPRIL 5 MG PO TABS: 5.0000 mg | ORAL_TABLET | Freq: Every day | ORAL | 0 refills | Status: DC

## 2022-02-04 IMAGING — DX DG PELVIS 1-2V
1 series · 1 of 1 positions shown · non-contrast
Comparison: None.

CLINICAL DATA: Weakness fiducial markers

EXAM:
PELVIS - 1-2 VIEW

[pelvis ap]
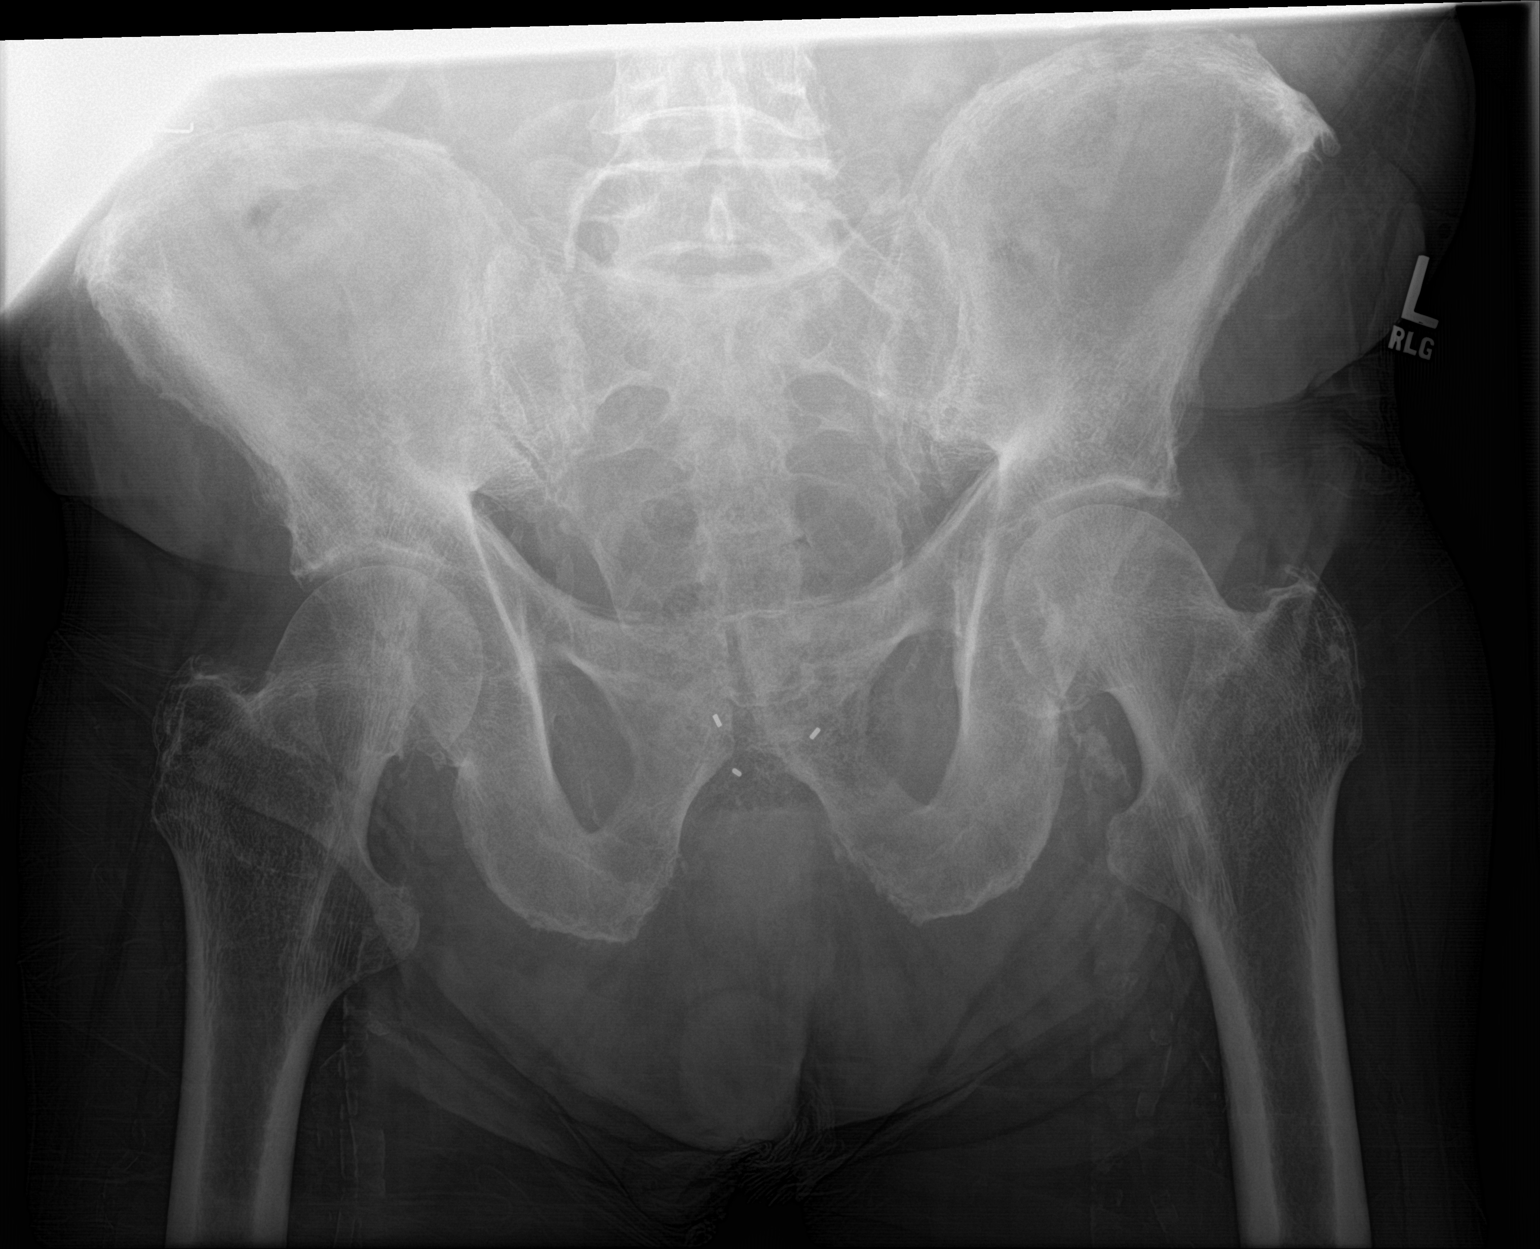

[1 of 1 positions shown; findings below may reference images not displayed]

FINDINGS: There is no evidence of pelvic fracture or diastasis. No pelvic bone
lesions are seen. Noted in the prostate gland.
IMPRESSION: Negative.

## 2022-02-05 DIAGNOSIS — E1140 Type 2 diabetes mellitus with diabetic neuropathy, unspecified: Secondary | ICD-10-CM | POA: Diagnosis not present

## 2022-02-05 DIAGNOSIS — R296 Repeated falls: Secondary | ICD-10-CM | POA: Diagnosis not present

## 2022-02-05 DIAGNOSIS — I10 Essential (primary) hypertension: Secondary | ICD-10-CM | POA: Diagnosis not present

## 2022-02-05 DIAGNOSIS — H6121 Impacted cerumen, right ear: Secondary | ICD-10-CM | POA: Diagnosis not present

## 2022-02-05 DIAGNOSIS — R609 Edema, unspecified: Secondary | ICD-10-CM | POA: Diagnosis not present

## 2022-02-05 DIAGNOSIS — I4891 Unspecified atrial fibrillation: Secondary | ICD-10-CM | POA: Diagnosis not present

## 2022-02-05 DIAGNOSIS — D649 Anemia, unspecified: Secondary | ICD-10-CM | POA: Diagnosis not present

## 2022-02-05 DIAGNOSIS — I5032 Chronic diastolic (congestive) heart failure: Secondary | ICD-10-CM | POA: Diagnosis not present

## 2022-02-05 DIAGNOSIS — Z09 Encounter for follow-up examination after completed treatment for conditions other than malignant neoplasm: Secondary | ICD-10-CM | POA: Diagnosis not present

## 2022-02-05 DIAGNOSIS — I700 Atherosclerosis of aorta: Secondary | ICD-10-CM | POA: Diagnosis not present

## 2022-02-05 DIAGNOSIS — R531 Weakness: Secondary | ICD-10-CM | POA: Diagnosis not present

## 2022-02-05 DIAGNOSIS — L309 Dermatitis, unspecified: Secondary | ICD-10-CM | POA: Diagnosis not present

## 2022-02-06 DIAGNOSIS — Z7984 Long term (current) use of oral hypoglycemic drugs: Secondary | ICD-10-CM | POA: Diagnosis not present

## 2022-02-06 DIAGNOSIS — M1990 Unspecified osteoarthritis, unspecified site: Secondary | ICD-10-CM | POA: Diagnosis not present

## 2022-02-06 DIAGNOSIS — D509 Iron deficiency anemia, unspecified: Secondary | ICD-10-CM | POA: Diagnosis not present

## 2022-02-06 DIAGNOSIS — E1149 Type 2 diabetes mellitus with other diabetic neurological complication: Secondary | ICD-10-CM | POA: Diagnosis not present

## 2022-02-06 DIAGNOSIS — M4802 Spinal stenosis, cervical region: Secondary | ICD-10-CM | POA: Diagnosis not present

## 2022-02-06 DIAGNOSIS — L97211 Non-pressure chronic ulcer of right calf limited to breakdown of skin: Secondary | ICD-10-CM | POA: Diagnosis not present

## 2022-02-06 DIAGNOSIS — I4821 Permanent atrial fibrillation: Secondary | ICD-10-CM | POA: Diagnosis not present

## 2022-02-06 DIAGNOSIS — D631 Anemia in chronic kidney disease: Secondary | ICD-10-CM | POA: Diagnosis not present

## 2022-02-06 DIAGNOSIS — I872 Venous insufficiency (chronic) (peripheral): Secondary | ICD-10-CM | POA: Diagnosis not present

## 2022-02-06 DIAGNOSIS — M2578 Osteophyte, vertebrae: Secondary | ICD-10-CM | POA: Diagnosis not present

## 2022-02-06 DIAGNOSIS — D62 Acute posthemorrhagic anemia: Secondary | ICD-10-CM | POA: Diagnosis not present

## 2022-02-06 DIAGNOSIS — I5032 Chronic diastolic (congestive) heart failure: Secondary | ICD-10-CM | POA: Diagnosis not present

## 2022-02-06 DIAGNOSIS — M19032 Primary osteoarthritis, left wrist: Secondary | ICD-10-CM | POA: Diagnosis not present

## 2022-02-06 DIAGNOSIS — I10 Essential (primary) hypertension: Secondary | ICD-10-CM | POA: Diagnosis not present

## 2022-02-06 DIAGNOSIS — N1832 Chronic kidney disease, stage 3b: Secondary | ICD-10-CM | POA: Diagnosis not present

## 2022-02-06 DIAGNOSIS — I2510 Atherosclerotic heart disease of native coronary artery without angina pectoris: Secondary | ICD-10-CM | POA: Diagnosis not present

## 2022-02-06 DIAGNOSIS — E1151 Type 2 diabetes mellitus with diabetic peripheral angiopathy without gangrene: Secondary | ICD-10-CM | POA: Diagnosis not present

## 2022-02-06 DIAGNOSIS — Z792 Long term (current) use of antibiotics: Secondary | ICD-10-CM | POA: Diagnosis not present

## 2022-02-06 DIAGNOSIS — E1140 Type 2 diabetes mellitus with diabetic neuropathy, unspecified: Secondary | ICD-10-CM | POA: Diagnosis not present

## 2022-02-06 DIAGNOSIS — M47812 Spondylosis without myelopathy or radiculopathy, cervical region: Secondary | ICD-10-CM | POA: Diagnosis not present

## 2022-02-06 DIAGNOSIS — I081 Rheumatic disorders of both mitral and tricuspid valves: Secondary | ICD-10-CM | POA: Diagnosis not present

## 2022-02-06 DIAGNOSIS — Z7982 Long term (current) use of aspirin: Secondary | ICD-10-CM | POA: Diagnosis not present

## 2022-02-06 DIAGNOSIS — E1122 Type 2 diabetes mellitus with diabetic chronic kidney disease: Secondary | ICD-10-CM | POA: Diagnosis not present

## 2022-02-06 DIAGNOSIS — I130 Hypertensive heart and chronic kidney disease with heart failure and stage 1 through stage 4 chronic kidney disease, or unspecified chronic kidney disease: Secondary | ICD-10-CM | POA: Diagnosis not present

## 2022-02-06 DIAGNOSIS — K259 Gastric ulcer, unspecified as acute or chronic, without hemorrhage or perforation: Secondary | ICD-10-CM | POA: Diagnosis not present

## 2022-02-06 DIAGNOSIS — E785 Hyperlipidemia, unspecified: Secondary | ICD-10-CM | POA: Diagnosis not present

## 2022-02-06 DIAGNOSIS — L97229 Non-pressure chronic ulcer of left calf with unspecified severity: Secondary | ICD-10-CM | POA: Diagnosis not present

## 2022-02-06 DIAGNOSIS — I4891 Unspecified atrial fibrillation: Secondary | ICD-10-CM | POA: Diagnosis not present

## 2022-02-06 DIAGNOSIS — E782 Mixed hyperlipidemia: Secondary | ICD-10-CM | POA: Diagnosis not present

## 2022-02-06 DIAGNOSIS — Z79899 Other long term (current) drug therapy: Secondary | ICD-10-CM | POA: Diagnosis not present

## 2022-02-06 DIAGNOSIS — K219 Gastro-esophageal reflux disease without esophagitis: Secondary | ICD-10-CM | POA: Diagnosis not present

## 2022-02-07 DIAGNOSIS — D631 Anemia in chronic kidney disease: Secondary | ICD-10-CM | POA: Diagnosis not present

## 2022-02-07 DIAGNOSIS — E782 Mixed hyperlipidemia: Secondary | ICD-10-CM | POA: Diagnosis not present

## 2022-02-07 DIAGNOSIS — Z7982 Long term (current) use of aspirin: Secondary | ICD-10-CM | POA: Diagnosis not present

## 2022-02-07 DIAGNOSIS — K259 Gastric ulcer, unspecified as acute or chronic, without hemorrhage or perforation: Secondary | ICD-10-CM | POA: Diagnosis not present

## 2022-02-07 DIAGNOSIS — L97211 Non-pressure chronic ulcer of right calf limited to breakdown of skin: Secondary | ICD-10-CM | POA: Diagnosis not present

## 2022-02-07 DIAGNOSIS — I130 Hypertensive heart and chronic kidney disease with heart failure and stage 1 through stage 4 chronic kidney disease, or unspecified chronic kidney disease: Secondary | ICD-10-CM | POA: Diagnosis not present

## 2022-02-07 DIAGNOSIS — L97229 Non-pressure chronic ulcer of left calf with unspecified severity: Secondary | ICD-10-CM | POA: Diagnosis not present

## 2022-02-07 DIAGNOSIS — E1122 Type 2 diabetes mellitus with diabetic chronic kidney disease: Secondary | ICD-10-CM | POA: Diagnosis not present

## 2022-02-07 DIAGNOSIS — I4821 Permanent atrial fibrillation: Secondary | ICD-10-CM | POA: Diagnosis not present

## 2022-02-07 DIAGNOSIS — I872 Venous insufficiency (chronic) (peripheral): Secondary | ICD-10-CM | POA: Diagnosis not present

## 2022-02-07 DIAGNOSIS — Z792 Long term (current) use of antibiotics: Secondary | ICD-10-CM | POA: Diagnosis not present

## 2022-02-07 DIAGNOSIS — E1151 Type 2 diabetes mellitus with diabetic peripheral angiopathy without gangrene: Secondary | ICD-10-CM | POA: Diagnosis not present

## 2022-02-07 DIAGNOSIS — Z7984 Long term (current) use of oral hypoglycemic drugs: Secondary | ICD-10-CM | POA: Diagnosis not present

## 2022-02-07 DIAGNOSIS — N1832 Chronic kidney disease, stage 3b: Secondary | ICD-10-CM | POA: Diagnosis not present

## 2022-02-07 DIAGNOSIS — M4802 Spinal stenosis, cervical region: Secondary | ICD-10-CM | POA: Diagnosis not present

## 2022-02-07 DIAGNOSIS — I2510 Atherosclerotic heart disease of native coronary artery without angina pectoris: Secondary | ICD-10-CM | POA: Diagnosis not present

## 2022-02-07 DIAGNOSIS — M47812 Spondylosis without myelopathy or radiculopathy, cervical region: Secondary | ICD-10-CM | POA: Diagnosis not present

## 2022-02-07 DIAGNOSIS — D62 Acute posthemorrhagic anemia: Secondary | ICD-10-CM | POA: Diagnosis not present

## 2022-02-07 DIAGNOSIS — D509 Iron deficiency anemia, unspecified: Secondary | ICD-10-CM | POA: Diagnosis not present

## 2022-02-07 DIAGNOSIS — E1149 Type 2 diabetes mellitus with other diabetic neurological complication: Secondary | ICD-10-CM | POA: Diagnosis not present

## 2022-02-07 DIAGNOSIS — M2578 Osteophyte, vertebrae: Secondary | ICD-10-CM | POA: Diagnosis not present

## 2022-02-07 DIAGNOSIS — Z79899 Other long term (current) drug therapy: Secondary | ICD-10-CM | POA: Diagnosis not present

## 2022-02-07 DIAGNOSIS — M19032 Primary osteoarthritis, left wrist: Secondary | ICD-10-CM | POA: Diagnosis not present

## 2022-02-07 DIAGNOSIS — I081 Rheumatic disorders of both mitral and tricuspid valves: Secondary | ICD-10-CM | POA: Diagnosis not present

## 2022-02-07 DIAGNOSIS — I5032 Chronic diastolic (congestive) heart failure: Secondary | ICD-10-CM | POA: Diagnosis not present

## 2022-02-08 DIAGNOSIS — I872 Venous insufficiency (chronic) (peripheral): Secondary | ICD-10-CM | POA: Diagnosis not present

## 2022-02-11 DIAGNOSIS — L97229 Non-pressure chronic ulcer of left calf with unspecified severity: Secondary | ICD-10-CM | POA: Diagnosis not present

## 2022-02-11 DIAGNOSIS — I081 Rheumatic disorders of both mitral and tricuspid valves: Secondary | ICD-10-CM | POA: Diagnosis not present

## 2022-02-11 DIAGNOSIS — Z7982 Long term (current) use of aspirin: Secondary | ICD-10-CM | POA: Diagnosis not present

## 2022-02-11 DIAGNOSIS — E1149 Type 2 diabetes mellitus with other diabetic neurological complication: Secondary | ICD-10-CM | POA: Diagnosis not present

## 2022-02-11 DIAGNOSIS — K259 Gastric ulcer, unspecified as acute or chronic, without hemorrhage or perforation: Secondary | ICD-10-CM | POA: Diagnosis not present

## 2022-02-11 DIAGNOSIS — Z792 Long term (current) use of antibiotics: Secondary | ICD-10-CM | POA: Diagnosis not present

## 2022-02-11 DIAGNOSIS — I5032 Chronic diastolic (congestive) heart failure: Secondary | ICD-10-CM | POA: Diagnosis not present

## 2022-02-11 DIAGNOSIS — M2578 Osteophyte, vertebrae: Secondary | ICD-10-CM | POA: Diagnosis not present

## 2022-02-11 DIAGNOSIS — E1151 Type 2 diabetes mellitus with diabetic peripheral angiopathy without gangrene: Secondary | ICD-10-CM | POA: Diagnosis not present

## 2022-02-11 DIAGNOSIS — I2510 Atherosclerotic heart disease of native coronary artery without angina pectoris: Secondary | ICD-10-CM | POA: Diagnosis not present

## 2022-02-11 DIAGNOSIS — N1832 Chronic kidney disease, stage 3b: Secondary | ICD-10-CM | POA: Diagnosis not present

## 2022-02-11 DIAGNOSIS — M47812 Spondylosis without myelopathy or radiculopathy, cervical region: Secondary | ICD-10-CM | POA: Diagnosis not present

## 2022-02-11 DIAGNOSIS — M4802 Spinal stenosis, cervical region: Secondary | ICD-10-CM | POA: Diagnosis not present

## 2022-02-11 DIAGNOSIS — I872 Venous insufficiency (chronic) (peripheral): Secondary | ICD-10-CM | POA: Diagnosis not present

## 2022-02-11 DIAGNOSIS — D62 Acute posthemorrhagic anemia: Secondary | ICD-10-CM | POA: Diagnosis not present

## 2022-02-11 DIAGNOSIS — M19032 Primary osteoarthritis, left wrist: Secondary | ICD-10-CM | POA: Diagnosis not present

## 2022-02-11 DIAGNOSIS — E782 Mixed hyperlipidemia: Secondary | ICD-10-CM | POA: Diagnosis not present

## 2022-02-11 DIAGNOSIS — E1122 Type 2 diabetes mellitus with diabetic chronic kidney disease: Secondary | ICD-10-CM | POA: Diagnosis not present

## 2022-02-11 DIAGNOSIS — D509 Iron deficiency anemia, unspecified: Secondary | ICD-10-CM | POA: Diagnosis not present

## 2022-02-11 DIAGNOSIS — L97211 Non-pressure chronic ulcer of right calf limited to breakdown of skin: Secondary | ICD-10-CM | POA: Diagnosis not present

## 2022-02-11 DIAGNOSIS — I130 Hypertensive heart and chronic kidney disease with heart failure and stage 1 through stage 4 chronic kidney disease, or unspecified chronic kidney disease: Secondary | ICD-10-CM | POA: Diagnosis not present

## 2022-02-11 DIAGNOSIS — Z7984 Long term (current) use of oral hypoglycemic drugs: Secondary | ICD-10-CM | POA: Diagnosis not present

## 2022-02-11 DIAGNOSIS — Z79899 Other long term (current) drug therapy: Secondary | ICD-10-CM | POA: Diagnosis not present

## 2022-02-11 DIAGNOSIS — N390 Urinary tract infection, site not specified: Secondary | ICD-10-CM | POA: Diagnosis not present

## 2022-02-11 DIAGNOSIS — D631 Anemia in chronic kidney disease: Secondary | ICD-10-CM | POA: Diagnosis not present

## 2022-02-11 DIAGNOSIS — I4821 Permanent atrial fibrillation: Secondary | ICD-10-CM | POA: Diagnosis not present

## 2022-02-12 ENCOUNTER — Telehealth: Payer: Self-pay | Admitting: Neurology

## 2022-02-12 NOTE — Telephone Encounter (Signed)
Can you find out more details about the patient?  I do not see any evidence of a brain bleed.  He was hospitalized earlier this year for GI bleed, anemia, and atrial fibrillation.  I reviewed his CT head and do not see any findings of a bleed.  Has he seen his PCP for change in behavior and sleepiness?  I don't have any immediate appointments, but we can add him to the wait list. ?

## 2022-02-12 NOTE — Telephone Encounter (Signed)
Daughter said he falling asleep all the time. He isn't his normal self. She thinks he needs to be seen. Brain bleed this year, and they told him he would get back to his normal self, but he hasn't. ?

## 2022-02-12 NOTE — Telephone Encounter (Signed)
I called and spoke to patients daughter Venida Jarvis. She informed me that patient fell in December and he has never been the same since. She stated that when he fell in December he hit his head and he seemed ok after his fall. However, as time passed he began hallucinating, became weak, and began to fall frequently. Patient was rushed to the hospital where they found that he was anemic due his stomach ulcers bleeding. Patients daughter stated that she feels the doctors were so focused on his bleeding ulcers that they didn't really address his brain bleed. She states that she was told the blood would absorb away.  ? ?Patients daughter has not reached out to the PCP or the doctor who informed them of the brain bleed (she doesn't know his name). She states patient has an appt tomorrow for lab work at his PCP and she was going to mention this to the nurse. Patients daughter states patients O2 has been fine and blood pressure low.  ? ?Patient has been in rehab and returned home recently. Daughter states she is worried because her dad is just not the same. She understands that he is 17 but expresses he is sleeping way to much.  ?

## 2022-02-13 DIAGNOSIS — Z792 Long term (current) use of antibiotics: Secondary | ICD-10-CM | POA: Diagnosis not present

## 2022-02-13 DIAGNOSIS — N1832 Chronic kidney disease, stage 3b: Secondary | ICD-10-CM | POA: Diagnosis not present

## 2022-02-13 DIAGNOSIS — Z79899 Other long term (current) drug therapy: Secondary | ICD-10-CM | POA: Diagnosis not present

## 2022-02-13 DIAGNOSIS — D649 Anemia, unspecified: Secondary | ICD-10-CM | POA: Diagnosis not present

## 2022-02-13 DIAGNOSIS — R531 Weakness: Secondary | ICD-10-CM | POA: Diagnosis not present

## 2022-02-13 DIAGNOSIS — I2510 Atherosclerotic heart disease of native coronary artery without angina pectoris: Secondary | ICD-10-CM | POA: Diagnosis not present

## 2022-02-13 DIAGNOSIS — R296 Repeated falls: Secondary | ICD-10-CM | POA: Diagnosis not present

## 2022-02-13 DIAGNOSIS — L97211 Non-pressure chronic ulcer of right calf limited to breakdown of skin: Secondary | ICD-10-CM | POA: Diagnosis not present

## 2022-02-13 DIAGNOSIS — Z7984 Long term (current) use of oral hypoglycemic drugs: Secondary | ICD-10-CM | POA: Diagnosis not present

## 2022-02-13 DIAGNOSIS — I5032 Chronic diastolic (congestive) heart failure: Secondary | ICD-10-CM | POA: Diagnosis not present

## 2022-02-13 DIAGNOSIS — E1140 Type 2 diabetes mellitus with diabetic neuropathy, unspecified: Secondary | ICD-10-CM | POA: Diagnosis not present

## 2022-02-13 DIAGNOSIS — Z7982 Long term (current) use of aspirin: Secondary | ICD-10-CM | POA: Diagnosis not present

## 2022-02-13 DIAGNOSIS — I4891 Unspecified atrial fibrillation: Secondary | ICD-10-CM | POA: Diagnosis not present

## 2022-02-13 DIAGNOSIS — D62 Acute posthemorrhagic anemia: Secondary | ICD-10-CM | POA: Diagnosis not present

## 2022-02-13 DIAGNOSIS — N289 Disorder of kidney and ureter, unspecified: Secondary | ICD-10-CM | POA: Diagnosis not present

## 2022-02-13 DIAGNOSIS — E1122 Type 2 diabetes mellitus with diabetic chronic kidney disease: Secondary | ICD-10-CM | POA: Diagnosis not present

## 2022-02-13 DIAGNOSIS — E1149 Type 2 diabetes mellitus with other diabetic neurological complication: Secondary | ICD-10-CM | POA: Diagnosis not present

## 2022-02-13 DIAGNOSIS — I872 Venous insufficiency (chronic) (peripheral): Secondary | ICD-10-CM | POA: Diagnosis not present

## 2022-02-13 DIAGNOSIS — I081 Rheumatic disorders of both mitral and tricuspid valves: Secondary | ICD-10-CM | POA: Diagnosis not present

## 2022-02-13 DIAGNOSIS — M2578 Osteophyte, vertebrae: Secondary | ICD-10-CM | POA: Diagnosis not present

## 2022-02-13 DIAGNOSIS — I4821 Permanent atrial fibrillation: Secondary | ICD-10-CM | POA: Diagnosis not present

## 2022-02-13 DIAGNOSIS — K259 Gastric ulcer, unspecified as acute or chronic, without hemorrhage or perforation: Secondary | ICD-10-CM | POA: Diagnosis not present

## 2022-02-13 DIAGNOSIS — E1151 Type 2 diabetes mellitus with diabetic peripheral angiopathy without gangrene: Secondary | ICD-10-CM | POA: Diagnosis not present

## 2022-02-13 DIAGNOSIS — M47812 Spondylosis without myelopathy or radiculopathy, cervical region: Secondary | ICD-10-CM | POA: Diagnosis not present

## 2022-02-13 DIAGNOSIS — D509 Iron deficiency anemia, unspecified: Secondary | ICD-10-CM | POA: Diagnosis not present

## 2022-02-13 DIAGNOSIS — M4802 Spinal stenosis, cervical region: Secondary | ICD-10-CM | POA: Diagnosis not present

## 2022-02-13 DIAGNOSIS — M19032 Primary osteoarthritis, left wrist: Secondary | ICD-10-CM | POA: Diagnosis not present

## 2022-02-13 DIAGNOSIS — I130 Hypertensive heart and chronic kidney disease with heart failure and stage 1 through stage 4 chronic kidney disease, or unspecified chronic kidney disease: Secondary | ICD-10-CM | POA: Diagnosis not present

## 2022-02-13 DIAGNOSIS — D631 Anemia in chronic kidney disease: Secondary | ICD-10-CM | POA: Diagnosis not present

## 2022-02-13 DIAGNOSIS — E782 Mixed hyperlipidemia: Secondary | ICD-10-CM | POA: Diagnosis not present

## 2022-02-13 DIAGNOSIS — L97229 Non-pressure chronic ulcer of left calf with unspecified severity: Secondary | ICD-10-CM | POA: Diagnosis not present

## 2022-02-13 NOTE — Telephone Encounter (Signed)
Glad that he has an appointment with PCP.   Many conditions can cause sleepiness and most recent CT head did not show any bleed, so I'm not sure it's stemming from neurological condition. They can evaluate him and determine whether he needs neurological follow-up.  ?

## 2022-02-14 ENCOUNTER — Telehealth: Payer: Self-pay | Admitting: Neurology

## 2022-02-14 NOTE — Telephone Encounter (Signed)
Called patients daughter and informed her of Dr. Serita Grit message below. Patients daughter verbalized understanding and had no further questions or concerns. ?

## 2022-02-14 NOTE — Telephone Encounter (Signed)
Called patient and left a message for a call back.  

## 2022-02-14 NOTE — Telephone Encounter (Signed)
See previous message. Patients daughter has been contacted. ?

## 2022-02-14 NOTE — Telephone Encounter (Signed)
Pt's daughter called back in returning Mahina's call ?

## 2022-02-15 DIAGNOSIS — Z7984 Long term (current) use of oral hypoglycemic drugs: Secondary | ICD-10-CM | POA: Diagnosis not present

## 2022-02-15 DIAGNOSIS — I2510 Atherosclerotic heart disease of native coronary artery without angina pectoris: Secondary | ICD-10-CM | POA: Diagnosis not present

## 2022-02-15 DIAGNOSIS — I5032 Chronic diastolic (congestive) heart failure: Secondary | ICD-10-CM | POA: Diagnosis not present

## 2022-02-15 DIAGNOSIS — L97229 Non-pressure chronic ulcer of left calf with unspecified severity: Secondary | ICD-10-CM | POA: Diagnosis not present

## 2022-02-15 DIAGNOSIS — L97211 Non-pressure chronic ulcer of right calf limited to breakdown of skin: Secondary | ICD-10-CM | POA: Diagnosis not present

## 2022-02-15 DIAGNOSIS — M19032 Primary osteoarthritis, left wrist: Secondary | ICD-10-CM | POA: Diagnosis not present

## 2022-02-15 DIAGNOSIS — D631 Anemia in chronic kidney disease: Secondary | ICD-10-CM | POA: Diagnosis not present

## 2022-02-15 DIAGNOSIS — D62 Acute posthemorrhagic anemia: Secondary | ICD-10-CM | POA: Diagnosis not present

## 2022-02-15 DIAGNOSIS — E782 Mixed hyperlipidemia: Secondary | ICD-10-CM | POA: Diagnosis not present

## 2022-02-15 DIAGNOSIS — K259 Gastric ulcer, unspecified as acute or chronic, without hemorrhage or perforation: Secondary | ICD-10-CM | POA: Diagnosis not present

## 2022-02-15 DIAGNOSIS — E1149 Type 2 diabetes mellitus with other diabetic neurological complication: Secondary | ICD-10-CM | POA: Diagnosis not present

## 2022-02-15 DIAGNOSIS — Z79899 Other long term (current) drug therapy: Secondary | ICD-10-CM | POA: Diagnosis not present

## 2022-02-15 DIAGNOSIS — M47812 Spondylosis without myelopathy or radiculopathy, cervical region: Secondary | ICD-10-CM | POA: Diagnosis not present

## 2022-02-15 DIAGNOSIS — Z7982 Long term (current) use of aspirin: Secondary | ICD-10-CM | POA: Diagnosis not present

## 2022-02-15 DIAGNOSIS — I081 Rheumatic disorders of both mitral and tricuspid valves: Secondary | ICD-10-CM | POA: Diagnosis not present

## 2022-02-15 DIAGNOSIS — I4821 Permanent atrial fibrillation: Secondary | ICD-10-CM | POA: Diagnosis not present

## 2022-02-15 DIAGNOSIS — M4802 Spinal stenosis, cervical region: Secondary | ICD-10-CM | POA: Diagnosis not present

## 2022-02-15 DIAGNOSIS — D509 Iron deficiency anemia, unspecified: Secondary | ICD-10-CM | POA: Diagnosis not present

## 2022-02-15 DIAGNOSIS — I130 Hypertensive heart and chronic kidney disease with heart failure and stage 1 through stage 4 chronic kidney disease, or unspecified chronic kidney disease: Secondary | ICD-10-CM | POA: Diagnosis not present

## 2022-02-15 DIAGNOSIS — Z792 Long term (current) use of antibiotics: Secondary | ICD-10-CM | POA: Diagnosis not present

## 2022-02-15 DIAGNOSIS — N1832 Chronic kidney disease, stage 3b: Secondary | ICD-10-CM | POA: Diagnosis not present

## 2022-02-15 DIAGNOSIS — E1122 Type 2 diabetes mellitus with diabetic chronic kidney disease: Secondary | ICD-10-CM | POA: Diagnosis not present

## 2022-02-15 DIAGNOSIS — M2578 Osteophyte, vertebrae: Secondary | ICD-10-CM | POA: Diagnosis not present

## 2022-02-15 DIAGNOSIS — I872 Venous insufficiency (chronic) (peripheral): Secondary | ICD-10-CM | POA: Diagnosis not present

## 2022-02-15 DIAGNOSIS — E1151 Type 2 diabetes mellitus with diabetic peripheral angiopathy without gangrene: Secondary | ICD-10-CM | POA: Diagnosis not present

## 2022-02-18 DIAGNOSIS — L97211 Non-pressure chronic ulcer of right calf limited to breakdown of skin: Secondary | ICD-10-CM | POA: Diagnosis not present

## 2022-02-18 DIAGNOSIS — M4802 Spinal stenosis, cervical region: Secondary | ICD-10-CM | POA: Diagnosis not present

## 2022-02-18 DIAGNOSIS — E1122 Type 2 diabetes mellitus with diabetic chronic kidney disease: Secondary | ICD-10-CM | POA: Diagnosis not present

## 2022-02-18 DIAGNOSIS — D509 Iron deficiency anemia, unspecified: Secondary | ICD-10-CM | POA: Diagnosis not present

## 2022-02-18 DIAGNOSIS — E1149 Type 2 diabetes mellitus with other diabetic neurological complication: Secondary | ICD-10-CM | POA: Diagnosis not present

## 2022-02-18 DIAGNOSIS — I130 Hypertensive heart and chronic kidney disease with heart failure and stage 1 through stage 4 chronic kidney disease, or unspecified chronic kidney disease: Secondary | ICD-10-CM | POA: Diagnosis not present

## 2022-02-18 DIAGNOSIS — E782 Mixed hyperlipidemia: Secondary | ICD-10-CM | POA: Diagnosis not present

## 2022-02-18 DIAGNOSIS — M47812 Spondylosis without myelopathy or radiculopathy, cervical region: Secondary | ICD-10-CM | POA: Diagnosis not present

## 2022-02-18 DIAGNOSIS — M2578 Osteophyte, vertebrae: Secondary | ICD-10-CM | POA: Diagnosis not present

## 2022-02-18 DIAGNOSIS — I872 Venous insufficiency (chronic) (peripheral): Secondary | ICD-10-CM | POA: Diagnosis not present

## 2022-02-18 DIAGNOSIS — D62 Acute posthemorrhagic anemia: Secondary | ICD-10-CM | POA: Diagnosis not present

## 2022-02-18 DIAGNOSIS — L97229 Non-pressure chronic ulcer of left calf with unspecified severity: Secondary | ICD-10-CM | POA: Diagnosis not present

## 2022-02-18 DIAGNOSIS — K259 Gastric ulcer, unspecified as acute or chronic, without hemorrhage or perforation: Secondary | ICD-10-CM | POA: Diagnosis not present

## 2022-02-18 DIAGNOSIS — Z792 Long term (current) use of antibiotics: Secondary | ICD-10-CM | POA: Diagnosis not present

## 2022-02-18 DIAGNOSIS — E1151 Type 2 diabetes mellitus with diabetic peripheral angiopathy without gangrene: Secondary | ICD-10-CM | POA: Diagnosis not present

## 2022-02-18 DIAGNOSIS — Z7984 Long term (current) use of oral hypoglycemic drugs: Secondary | ICD-10-CM | POA: Diagnosis not present

## 2022-02-18 DIAGNOSIS — M19032 Primary osteoarthritis, left wrist: Secondary | ICD-10-CM | POA: Diagnosis not present

## 2022-02-18 DIAGNOSIS — Z79899 Other long term (current) drug therapy: Secondary | ICD-10-CM | POA: Diagnosis not present

## 2022-02-18 DIAGNOSIS — I2510 Atherosclerotic heart disease of native coronary artery without angina pectoris: Secondary | ICD-10-CM | POA: Diagnosis not present

## 2022-02-18 DIAGNOSIS — N1832 Chronic kidney disease, stage 3b: Secondary | ICD-10-CM | POA: Diagnosis not present

## 2022-02-18 DIAGNOSIS — I4821 Permanent atrial fibrillation: Secondary | ICD-10-CM | POA: Diagnosis not present

## 2022-02-18 DIAGNOSIS — D631 Anemia in chronic kidney disease: Secondary | ICD-10-CM | POA: Diagnosis not present

## 2022-02-18 DIAGNOSIS — I5032 Chronic diastolic (congestive) heart failure: Secondary | ICD-10-CM | POA: Diagnosis not present

## 2022-02-18 DIAGNOSIS — I081 Rheumatic disorders of both mitral and tricuspid valves: Secondary | ICD-10-CM | POA: Diagnosis not present

## 2022-02-18 DIAGNOSIS — Z7982 Long term (current) use of aspirin: Secondary | ICD-10-CM | POA: Diagnosis not present

## 2022-02-19 NOTE — Progress Notes (Deleted)
Cardiology Office Note:    Date:  02/19/2022   ID:  [name], DOB 1933-10-25, MRN [id number]  PCP:  [name], MD  [facility] Cardiologist:  Rudean Haskell MD [name] Electrophysiologist:  None   CC:  Follow up HFpEF  History of Present Illness:    [name] is a 88 y.o. male with a hx of Coronary Artery Calcification, severe aortic stenosis s/p transapical TAVR [name] 28, Dr. Burt Knack, 2014), HFpEF, Moderate Mitral Regurgitation, Moderate Tricuspid Regurgitation, DM with HTN, persistent atrial fibrillation seen by Dr. Lovena Le, PAD seen by Dr. [name] who presented for evaluation 09/11/20.   2022: Found to have elevated BNP and was started on Farxiga and lasix 40 mg PO Daily- developed AKI.  Has had lasix does reduced and farixga held with improvement of kidney function but with leg swelling; returned his diuretics.  Saw Dr. [name] and had normal ABIs.In interim of this visit.  Norvasc-> verapamil.  Had fall.  2023: multiple bleeding issues requiring transfusion admissions. No plans to start warfarin back, he has been off anti-platelet meds.  We have increase his diuretics (now on torsemide) and his TEE was cancelled because it was missed during admission.  Patient notes that he is doing ***.   Since day prior/last visit notes *** . There are no*** interval hospital/ED visit.    No chest pain or pressure ***.  No SOB/DOE*** and no PND/Orthopnea***.  No weight gain or leg swelling***.  No palpitations or syncope ***.  Ambulatory blood pressure ***.   Past Medical History:  Diagnosis Date   Anemia    LOW PLATELETS OTHER DAY  PER PT   Anticoagulated on Coumadin 12/21/2012   Aortic stenosis    Arthritis    "left wrist; back sometimes" (01/21/2013)   Atrial fibrillation ([place])    [name], DR COOPER   Bradycardia    Exertional shortness of breath    Heart murmur    "I've had it for years; runs in the family on daddy's side" (01/21/2013)   HTN  (hypertension)    Hyperlipemia 12/21/2012   Prostate cancer ([place])    "had 40 tx of radiation in 2009" (01/21/2013)   S/P aortic valve replacement with bioprosthetic valve 01/19/2013   Transcatheter Aortic Valve Replacement using 53m Sapien bioprosthetic tissue valve via transapical approach   Type II diabetes mellitus ([place]     Past Surgical History:  Procedure Laterality Date   BALLOON DILATION N/A 08/02/2020   Procedure: BALLOON DILATION;  Surgeon: [name] MD;  [facility];  Service: Gastroenterology;  Laterality: N/A;   BIOPSY  10/03/2021   Procedure: BIOPSY;  Surgeon: [name] MD;  [facility];  Service: Endoscopy;;   BIOPSY  12/14/2021   Procedure: BIOPSY;  Surgeon: [name] MD;  [facility] [facility]  Service: Endoscopy;;   CARDIAC CATHETERIZATION  12/16/2012   CARDIAC VALVE REPLACEMENT  01/19/2013   AVR   CATARACT EXTRACTION W/ INTRAOCULAR LENS  IMPLANT, BILATERAL     ESOPHAGOGASTRODUODENOSCOPY N/A 10/03/2021   Procedure: ESOPHAGOGASTRODUODENOSCOPY (EGD);  Surgeon: [name] MD;  [facility];  Service: Endoscopy;  Laterality: N/A;   ESOPHAGOGASTRODUODENOSCOPY N/A 12/14/2021   Procedure: ESOPHAGOGASTRODUODENOSCOPY (EGD);  Surgeon: [name] MD;  [place] [facility]  Service: Endoscopy;  Laterality: N/A;   ESOPHAGOGASTRODUODENOSCOPY (EGD) WITH PROPOFOL N/A 08/02/2020   Procedure: ESOPHAGOGASTRODUODENOSCOPY (EGD) WITH PROPOFOL;  Surgeon: [name] MD;  [facility];  Service: Gastroenterology;  Laterality: N/A;   EYE SURGERY  INTRAOPERATIVE TRANSESOPHAGEAL ECHOCARDIOGRAM N/A 01/19/2013   Procedure: INTRAOPERATIVE TRANSESOPHAGEAL ECHOCARDIOGRAM;  Surgeon: [name], MD;  Location: [facility];  Service: Open Heart Surgery;  Laterality: N/A;   ORIF FOREARM FRACTURE Left 1954   "compound fx" (01/21/2013)   TRANSTHORACIC ECHOCARDIOGRAM  09/04/10, 09/07/08    Current Medications: No outpatient medications  have been marked as taking for the 02/20/22 encounter (Appointment) with [name], MD.     Allergies:   Amoxicillin   Social History   Socioeconomic History   Marital status: Married    Spouse name: Not on file   Number of children: Not on file   Years of education: Not on file   Highest education level: Not on file  Occupational History   Not on file  Tobacco Use   Smoking status: Former    Packs/day: 2.00    Years: 22.00    Pack years: 44.00    Types: Cigarettes    Quit date: 09/11/1976    Years since quitting: 45.4   Smokeless tobacco: Never  Vaping Use   Vaping Use: Never used  Substance and Sexual Activity   Alcohol use: No   Drug use: No   Sexual activity: Never  Other Topics Concern   Not on file  Social History Narrative   Not on file   Social Determinants of Health   Financial Resource Strain: Not on file  Food Insecurity: Not on file  Transportation Needs: Not on file  Physical Activity: Not on file  Stress: Not on file  Social Connections: Not on file    Family History: The patient's family history includes Bone cancer (age of onset: 15) in his mother; Cancer (age of onset: 102) in his brother; Diabetes in his mother; Heart disease in his father; Melanoma (age of onset: 60) in his mother; Pancreatic cancer (age of onset: 42) in his father; Stroke in his sister.  ROS:   Please see the history of present illness.    All other systems reviewed and are negative.  EKGs/Labs/Other Studies Reviewed:    The following studies were reviewed today:  EKG:   09/11/20 Atrial fibrillation Rate 63  Transthoracic Echocardiogram: Date:08/18/20: Results: Moderate eccentric mitral regurgitation without prolapse and Moderate tricuspid regurgitation, mild RV dysfunction, TAVR gradients OK 1. Left ventricular ejection fraction, by estimation, is 55 to 60%. The  left ventricle has normal function. The left ventricle has no regional  wall motion  abnormalities. There is moderate left ventricular hypertrophy.  Left ventricular diastolic  parameters are indeterminate.   2. Right ventricular systolic function is mildly reduced. The right  ventricular size is normal. There is severely elevated pulmonary artery  systolic pressure. The estimated right ventricular systolic pressure is  51.7 mmHg.   3. Left atrial size was moderately dilated.   4. Right atrial size was moderately dilated.   5. The mitral valve is abnormal, there is restriction of the posterior  leaflet. Moderate mitral valve regurgitation with PISA ERO 0.25 cm^2. No  evidence of mitral stenosis. Moderate mitral annular calcification.   6. Tricuspid valve regurgitation is moderate.   7. S/p TAVR with 26 mm Edwards Sapien bioprosthesis. Mean gradient 8  mmHg, no evidence for significant stenosis. No significant peri-valvular  leakage noted.   8. The inferior vena cava is dilated in size with >50% respiratory  variability, suggesting right atrial pressure of 8 mmHg.   9. The patient was in atrial fibrillation.   Recent Labs: 12/05/2021: NT-Pro BNP 6,343 12/13/2021: B  Natriuretic Peptide 768.6; TSH 1.653 12/14/2021: Magnesium 2.1 12/15/2021: ALT 20 01/23/2022: BUN 33; Creatinine, Ser 0.97; Hemoglobin 10.5; Platelets 232; Potassium 4.1; Sodium 142  Recent Lipid Panel    Component Value Date/Time   CHOL 81 (L) 08/17/2018 0748   TRIG 82 08/17/2018 0748   HDL 28 (L) 08/17/2018 0748   CHOLHDL 2.9 08/17/2018 0748   LDLCALC 37 08/17/2018 0748    Physical Exam:    VS:  There were no vitals taken for this visit.    Wt Readings from Last 3 Encounters:  01/30/22 156 lb 3.2 oz (70.9 kg)  01/25/22 160 lb 6.4 oz (72.8 kg)  01/23/22 160 lb (72.6 kg)    Gen: *** distress, *** obese/well nourished/malnourished   Neck: No JVD, *** carotid bruit Ears: *** Frank Sign Cardiac: No Rubs or Gallops, *** Murmur, ***cardia, *** radial pulses Respiratory: Clear to auscultation  bilaterally, *** effort, ***  respiratory rate GI: Soft, nontender, non-distended *** MS: No *** edema; *** moves all extremities Integument: Skin feels *** Neuro:  At time of evaluation, alert and oriented to person/place/time/situation *** Psych: Normal affect, patient feels ***    ASSESSMENT:    No diagnosis found.   PLAN:    Moderate to Severe Mitral Regurgitation Severe AS s/p TAVR HFpEF Moderate Tricuspid Regurgitation HTN with DM Long standing, persistent atrial fibrillation seen by Dr. Lovena Le Peripheral Arterial Disease seen by Dr. [name] HLD with LDL < 55 - CHADSVASC 4, complicated by multiple bleeding events - NYHA class III, Stage C, hypervolemic - Diuretic regimen:  - - SGLT2i at 10 mg PO daily  - 2 L Fluid restriction  - Coreg at 12.5 mg PO BID ; patient to call in to confirm he is not also taking metoprolol succinate; if taking both will switch to just succinate 50 mg PO daily  - Lisinopril 20 mg Po daily - on verapamil 180 (norvasc was stopped prior) - Has Clindamycin 600 mg PO PRN dental procedure (1 hr prior) - restart compression stockings  TEE    Medication Adjustments/Labs and Tests Ordered: Current medicines are reviewed at length with the patient today.  Concerns regarding medicines are outlined above.  No orders of the defined types were placed in this encounter.  No orders of the defined types were placed in this encounter.   There are no Patient Instructions on file for this visit.   Signed, [name], MD  02/19/2022 8:27 AM    [place]

## 2022-02-20 ENCOUNTER — Ambulatory Visit: Payer: Medicare Other | Admitting: Internal Medicine

## 2022-02-20 ENCOUNTER — Telehealth: Payer: Self-pay | Admitting: Internal Medicine

## 2022-02-20 DIAGNOSIS — E1122 Type 2 diabetes mellitus with diabetic chronic kidney disease: Secondary | ICD-10-CM | POA: Diagnosis not present

## 2022-02-20 DIAGNOSIS — M19032 Primary osteoarthritis, left wrist: Secondary | ICD-10-CM | POA: Diagnosis not present

## 2022-02-20 DIAGNOSIS — M2578 Osteophyte, vertebrae: Secondary | ICD-10-CM | POA: Diagnosis not present

## 2022-02-20 DIAGNOSIS — D631 Anemia in chronic kidney disease: Secondary | ICD-10-CM | POA: Diagnosis not present

## 2022-02-20 DIAGNOSIS — I5032 Chronic diastolic (congestive) heart failure: Secondary | ICD-10-CM | POA: Diagnosis not present

## 2022-02-20 DIAGNOSIS — E1149 Type 2 diabetes mellitus with other diabetic neurological complication: Secondary | ICD-10-CM | POA: Diagnosis not present

## 2022-02-20 DIAGNOSIS — M47812 Spondylosis without myelopathy or radiculopathy, cervical region: Secondary | ICD-10-CM | POA: Diagnosis not present

## 2022-02-20 DIAGNOSIS — I872 Venous insufficiency (chronic) (peripheral): Secondary | ICD-10-CM | POA: Diagnosis not present

## 2022-02-20 DIAGNOSIS — K259 Gastric ulcer, unspecified as acute or chronic, without hemorrhage or perforation: Secondary | ICD-10-CM | POA: Diagnosis not present

## 2022-02-20 DIAGNOSIS — Z792 Long term (current) use of antibiotics: Secondary | ICD-10-CM | POA: Diagnosis not present

## 2022-02-20 DIAGNOSIS — Z79899 Other long term (current) drug therapy: Secondary | ICD-10-CM | POA: Diagnosis not present

## 2022-02-20 DIAGNOSIS — Z7982 Long term (current) use of aspirin: Secondary | ICD-10-CM | POA: Diagnosis not present

## 2022-02-20 DIAGNOSIS — I081 Rheumatic disorders of both mitral and tricuspid valves: Secondary | ICD-10-CM | POA: Diagnosis not present

## 2022-02-20 DIAGNOSIS — N1832 Chronic kidney disease, stage 3b: Secondary | ICD-10-CM | POA: Diagnosis not present

## 2022-02-20 DIAGNOSIS — I4821 Permanent atrial fibrillation: Secondary | ICD-10-CM | POA: Diagnosis not present

## 2022-02-20 DIAGNOSIS — M4802 Spinal stenosis, cervical region: Secondary | ICD-10-CM | POA: Diagnosis not present

## 2022-02-20 DIAGNOSIS — D509 Iron deficiency anemia, unspecified: Secondary | ICD-10-CM | POA: Diagnosis not present

## 2022-02-20 DIAGNOSIS — L97229 Non-pressure chronic ulcer of left calf with unspecified severity: Secondary | ICD-10-CM | POA: Diagnosis not present

## 2022-02-20 DIAGNOSIS — L97211 Non-pressure chronic ulcer of right calf limited to breakdown of skin: Secondary | ICD-10-CM | POA: Diagnosis not present

## 2022-02-20 DIAGNOSIS — D62 Acute posthemorrhagic anemia: Secondary | ICD-10-CM | POA: Diagnosis not present

## 2022-02-20 DIAGNOSIS — I130 Hypertensive heart and chronic kidney disease with heart failure and stage 1 through stage 4 chronic kidney disease, or unspecified chronic kidney disease: Secondary | ICD-10-CM | POA: Diagnosis not present

## 2022-02-20 DIAGNOSIS — E1151 Type 2 diabetes mellitus with diabetic peripheral angiopathy without gangrene: Secondary | ICD-10-CM | POA: Diagnosis not present

## 2022-02-20 DIAGNOSIS — I2510 Atherosclerotic heart disease of native coronary artery without angina pectoris: Secondary | ICD-10-CM | POA: Diagnosis not present

## 2022-02-20 DIAGNOSIS — E782 Mixed hyperlipidemia: Secondary | ICD-10-CM | POA: Diagnosis not present

## 2022-02-20 DIAGNOSIS — Z7984 Long term (current) use of oral hypoglycemic drugs: Secondary | ICD-10-CM | POA: Diagnosis not present

## 2022-02-20 MED ORDER — TORSEMIDE 20 MG PO TABS: 40.0000 mg | ORAL_TABLET | Freq: Two times a day (BID) | ORAL | 3 refills | Status: DC

## 2022-02-20 NOTE — Telephone Encounter (Signed)
Notified Don OT that [employer], RN called in today and pt weight/ swelling was addressed.  Pt has new orders in place.  Advised to call in with any further concerns.  ?

## 2022-02-20 NOTE — Telephone Encounter (Signed)
Beth from [facility] called. She reported swelling in the patient. She was not with him any more after she called  ?1+ edema under unna boots  ?2+ edema from knee to groin ? ? ?Pt c/o swelling: STAT is pt has developed SOB within 24 hours ? ?If swelling, where is the swelling located? Legs ? ?How much weight have you gained and in what time span? 3 lbs overnight ? ?Have you gained 3 pounds in a day or 5 pounds in a week? yes ? ?Do you have a log of your daily weights (if so, list)? No. The patient is very unsteady and can not be weighed daily. It takes two people to weigh him  ? ?Are you currently taking a fluid pill? Yes  ? ?Are you currently SOB?  With movement  ? ?Have you traveled recently? no ?

## 2022-02-20 NOTE — Telephone Encounter (Signed)
Spoke with Eustaquio Maize, RN advised of MD recommendations: increase torsemide to 40 mg PO BID and have labs drawn in 5-7 days.  Beth will draw BMP and send results to our office.  Beth will determine if DOD visit is needed for swelling at f/u visit with pt.  She will call pt to advise of new orders.  No further questions or concerns.  ?

## 2022-02-20 NOTE — Telephone Encounter (Signed)
Left a message to call back.

## 2022-02-20 NOTE — Telephone Encounter (Signed)
Pt c/o swelling: STAT is pt has developed SOB within 24 hours ? ?If swelling, where is the swelling located? Pitting +2 -location not given ? ?How much weight have you gained and in what time span?  ? ?Have you gained 3 pounds in a day or 5 pounds in a week?  ? ?Do you have a log of your daily weights (if so, list)?  ?02/06/22: 158.0 ?02/15/22: 152.0 ?02/18/22: 157.8 (10:20 am) 156.2 (12:45 pm) ?02/20/22: 159.8 ? ?Are you currently taking a fluid pill? yes ? ?Are you currently SOB? no ? ?Have you traveled recently? No ? ? ?Occupational Therapist called to report weights from recent visits. The OT feels that the weight 02/15/22 may be an error but if it is accurate then he would have a significant weight gain. The patient did not have any other symptoms.  ?

## 2022-02-20 NOTE — Telephone Encounter (Signed)
Spoke with Fort[facility] Sic Nurse.  Reports pt has been home from facility for around 2 weeks.  Pt noted with swelling since release from facility.  +2 edema from bilateral knees to groin.  Has unna boot on and +1 edema from knees down.  Pt had BMP at PCP office last Friday results not in Epic.  Pt was ordered to take Torsemide 20 mg PO QD but family has given pt 20 mg PO BID d/t swelling.  Difficult to weigh pt as now in [facility].  Not eating or drinking much. Urine noted to be dark. Nurse notes pt sleeps a lot is on Keflex for infection.  Reports last BP around 108/70-80.  Nurse really concerned about swelling.  I advised Nurse that pt was scheduled to see MD today at 9 am.  She reports daughter called in today to find out time of appointment and was unable to make it here by scheduled time.  Will send to MD to review.   ?

## 2022-02-21 MED ORDER — POTASSIUM CHLORIDE CRYS ER 20 MEQ PO TBCR: 40.0000 meq | EXTENDED_RELEASE_TABLET | Freq: Every day | ORAL | 1 refills | Status: DC

## 2022-02-21 NOTE — Addendum Note (Signed)
Addended by: Precious Gilding on: 02/21/2022 01:15 PM ? ? Modules accepted: Orders ? ?

## 2022-02-21 NOTE — Telephone Encounter (Signed)
Aaron Macadam, RN [facility] Nurses advised of MD recommendation to start potassium 40 mEq PO QD.  [name] will notify pt daughter of medication addition.  No further questions.  ?

## 2022-02-21 NOTE — Telephone Encounter (Signed)
? ?  Beth calling to follow up pt's torsemide. She said it was increased yesterday but pt is not on potassium, she wanted to f/u if pt needs to be on potassium too ?

## 2022-02-22 DIAGNOSIS — D62 Acute posthemorrhagic anemia: Secondary | ICD-10-CM | POA: Diagnosis not present

## 2022-02-22 DIAGNOSIS — E1149 Type 2 diabetes mellitus with other diabetic neurological complication: Secondary | ICD-10-CM | POA: Diagnosis not present

## 2022-02-22 DIAGNOSIS — N1832 Chronic kidney disease, stage 3b: Secondary | ICD-10-CM | POA: Diagnosis not present

## 2022-02-22 DIAGNOSIS — Z79899 Other long term (current) drug therapy: Secondary | ICD-10-CM | POA: Diagnosis not present

## 2022-02-22 DIAGNOSIS — E782 Mixed hyperlipidemia: Secondary | ICD-10-CM | POA: Diagnosis not present

## 2022-02-22 DIAGNOSIS — D631 Anemia in chronic kidney disease: Secondary | ICD-10-CM | POA: Diagnosis not present

## 2022-02-22 DIAGNOSIS — Z7984 Long term (current) use of oral hypoglycemic drugs: Secondary | ICD-10-CM | POA: Diagnosis not present

## 2022-02-22 DIAGNOSIS — M19032 Primary osteoarthritis, left wrist: Secondary | ICD-10-CM | POA: Diagnosis not present

## 2022-02-22 DIAGNOSIS — L97229 Non-pressure chronic ulcer of left calf with unspecified severity: Secondary | ICD-10-CM | POA: Diagnosis not present

## 2022-02-22 DIAGNOSIS — L97211 Non-pressure chronic ulcer of right calf limited to breakdown of skin: Secondary | ICD-10-CM | POA: Diagnosis not present

## 2022-02-22 DIAGNOSIS — K259 Gastric ulcer, unspecified as acute or chronic, without hemorrhage or perforation: Secondary | ICD-10-CM | POA: Diagnosis not present

## 2022-02-22 DIAGNOSIS — Z7982 Long term (current) use of aspirin: Secondary | ICD-10-CM | POA: Diagnosis not present

## 2022-02-22 DIAGNOSIS — M2578 Osteophyte, vertebrae: Secondary | ICD-10-CM | POA: Diagnosis not present

## 2022-02-22 DIAGNOSIS — I081 Rheumatic disorders of both mitral and tricuspid valves: Secondary | ICD-10-CM | POA: Diagnosis not present

## 2022-02-22 DIAGNOSIS — I872 Venous insufficiency (chronic) (peripheral): Secondary | ICD-10-CM | POA: Diagnosis not present

## 2022-02-22 DIAGNOSIS — I4821 Permanent atrial fibrillation: Secondary | ICD-10-CM | POA: Diagnosis not present

## 2022-02-22 DIAGNOSIS — D509 Iron deficiency anemia, unspecified: Secondary | ICD-10-CM | POA: Diagnosis not present

## 2022-02-22 DIAGNOSIS — I5032 Chronic diastolic (congestive) heart failure: Secondary | ICD-10-CM | POA: Diagnosis not present

## 2022-02-22 DIAGNOSIS — I2510 Atherosclerotic heart disease of native coronary artery without angina pectoris: Secondary | ICD-10-CM | POA: Diagnosis not present

## 2022-02-22 DIAGNOSIS — E1151 Type 2 diabetes mellitus with diabetic peripheral angiopathy without gangrene: Secondary | ICD-10-CM | POA: Diagnosis not present

## 2022-02-22 DIAGNOSIS — E1122 Type 2 diabetes mellitus with diabetic chronic kidney disease: Secondary | ICD-10-CM | POA: Diagnosis not present

## 2022-02-22 DIAGNOSIS — I130 Hypertensive heart and chronic kidney disease with heart failure and stage 1 through stage 4 chronic kidney disease, or unspecified chronic kidney disease: Secondary | ICD-10-CM | POA: Diagnosis not present

## 2022-02-22 DIAGNOSIS — M47812 Spondylosis without myelopathy or radiculopathy, cervical region: Secondary | ICD-10-CM | POA: Diagnosis not present

## 2022-02-22 DIAGNOSIS — Z792 Long term (current) use of antibiotics: Secondary | ICD-10-CM | POA: Diagnosis not present

## 2022-02-22 DIAGNOSIS — M4802 Spinal stenosis, cervical region: Secondary | ICD-10-CM | POA: Diagnosis not present

## 2022-02-25 DIAGNOSIS — Z792 Long term (current) use of antibiotics: Secondary | ICD-10-CM | POA: Diagnosis not present

## 2022-02-25 DIAGNOSIS — E782 Mixed hyperlipidemia: Secondary | ICD-10-CM | POA: Diagnosis not present

## 2022-02-25 DIAGNOSIS — I081 Rheumatic disorders of both mitral and tricuspid valves: Secondary | ICD-10-CM | POA: Diagnosis not present

## 2022-02-25 DIAGNOSIS — M19032 Primary osteoarthritis, left wrist: Secondary | ICD-10-CM | POA: Diagnosis not present

## 2022-02-25 DIAGNOSIS — M47812 Spondylosis without myelopathy or radiculopathy, cervical region: Secondary | ICD-10-CM | POA: Diagnosis not present

## 2022-02-25 DIAGNOSIS — D631 Anemia in chronic kidney disease: Secondary | ICD-10-CM | POA: Diagnosis not present

## 2022-02-25 DIAGNOSIS — Z7984 Long term (current) use of oral hypoglycemic drugs: Secondary | ICD-10-CM | POA: Diagnosis not present

## 2022-02-25 DIAGNOSIS — I2510 Atherosclerotic heart disease of native coronary artery without angina pectoris: Secondary | ICD-10-CM | POA: Diagnosis not present

## 2022-02-25 DIAGNOSIS — I872 Venous insufficiency (chronic) (peripheral): Secondary | ICD-10-CM | POA: Diagnosis not present

## 2022-02-25 DIAGNOSIS — M4802 Spinal stenosis, cervical region: Secondary | ICD-10-CM | POA: Diagnosis not present

## 2022-02-25 DIAGNOSIS — E1122 Type 2 diabetes mellitus with diabetic chronic kidney disease: Secondary | ICD-10-CM | POA: Diagnosis not present

## 2022-02-25 DIAGNOSIS — Z7982 Long term (current) use of aspirin: Secondary | ICD-10-CM | POA: Diagnosis not present

## 2022-02-25 DIAGNOSIS — N1832 Chronic kidney disease, stage 3b: Secondary | ICD-10-CM | POA: Diagnosis not present

## 2022-02-25 DIAGNOSIS — E1151 Type 2 diabetes mellitus with diabetic peripheral angiopathy without gangrene: Secondary | ICD-10-CM | POA: Diagnosis not present

## 2022-02-25 DIAGNOSIS — D509 Iron deficiency anemia, unspecified: Secondary | ICD-10-CM | POA: Diagnosis not present

## 2022-02-25 DIAGNOSIS — M2578 Osteophyte, vertebrae: Secondary | ICD-10-CM | POA: Diagnosis not present

## 2022-02-25 DIAGNOSIS — I4821 Permanent atrial fibrillation: Secondary | ICD-10-CM | POA: Diagnosis not present

## 2022-02-25 DIAGNOSIS — K259 Gastric ulcer, unspecified as acute or chronic, without hemorrhage or perforation: Secondary | ICD-10-CM | POA: Diagnosis not present

## 2022-02-25 DIAGNOSIS — D62 Acute posthemorrhagic anemia: Secondary | ICD-10-CM | POA: Diagnosis not present

## 2022-02-25 DIAGNOSIS — I5032 Chronic diastolic (congestive) heart failure: Secondary | ICD-10-CM | POA: Diagnosis not present

## 2022-02-25 DIAGNOSIS — L97211 Non-pressure chronic ulcer of right calf limited to breakdown of skin: Secondary | ICD-10-CM | POA: Diagnosis not present

## 2022-02-25 DIAGNOSIS — E1149 Type 2 diabetes mellitus with other diabetic neurological complication: Secondary | ICD-10-CM | POA: Diagnosis not present

## 2022-02-25 DIAGNOSIS — Z79899 Other long term (current) drug therapy: Secondary | ICD-10-CM | POA: Diagnosis not present

## 2022-02-25 DIAGNOSIS — I130 Hypertensive heart and chronic kidney disease with heart failure and stage 1 through stage 4 chronic kidney disease, or unspecified chronic kidney disease: Secondary | ICD-10-CM | POA: Diagnosis not present

## 2022-02-25 DIAGNOSIS — L97229 Non-pressure chronic ulcer of left calf with unspecified severity: Secondary | ICD-10-CM | POA: Diagnosis not present

## 2022-02-27 ENCOUNTER — Telehealth: Payer: Self-pay | Admitting: Internal Medicine

## 2022-02-27 ENCOUNTER — Other Ambulatory Visit: Payer: Self-pay

## 2022-02-27 DIAGNOSIS — M2578 Osteophyte, vertebrae: Secondary | ICD-10-CM | POA: Diagnosis not present

## 2022-02-27 DIAGNOSIS — I5032 Chronic diastolic (congestive) heart failure: Secondary | ICD-10-CM

## 2022-02-27 DIAGNOSIS — E1149 Type 2 diabetes mellitus with other diabetic neurological complication: Secondary | ICD-10-CM | POA: Diagnosis not present

## 2022-02-27 DIAGNOSIS — Z79899 Other long term (current) drug therapy: Secondary | ICD-10-CM | POA: Diagnosis not present

## 2022-02-27 DIAGNOSIS — E1151 Type 2 diabetes mellitus with diabetic peripheral angiopathy without gangrene: Secondary | ICD-10-CM | POA: Diagnosis not present

## 2022-02-27 DIAGNOSIS — E1122 Type 2 diabetes mellitus with diabetic chronic kidney disease: Secondary | ICD-10-CM | POA: Diagnosis not present

## 2022-02-27 DIAGNOSIS — I130 Hypertensive heart and chronic kidney disease with heart failure and stage 1 through stage 4 chronic kidney disease, or unspecified chronic kidney disease: Secondary | ICD-10-CM | POA: Diagnosis not present

## 2022-02-27 DIAGNOSIS — L97229 Non-pressure chronic ulcer of left calf with unspecified severity: Secondary | ICD-10-CM | POA: Diagnosis not present

## 2022-02-27 DIAGNOSIS — M47812 Spondylosis without myelopathy or radiculopathy, cervical region: Secondary | ICD-10-CM | POA: Diagnosis not present

## 2022-02-27 DIAGNOSIS — Z792 Long term (current) use of antibiotics: Secondary | ICD-10-CM | POA: Diagnosis not present

## 2022-02-27 DIAGNOSIS — I872 Venous insufficiency (chronic) (peripheral): Secondary | ICD-10-CM | POA: Diagnosis not present

## 2022-02-27 DIAGNOSIS — D62 Acute posthemorrhagic anemia: Secondary | ICD-10-CM | POA: Diagnosis not present

## 2022-02-27 DIAGNOSIS — D631 Anemia in chronic kidney disease: Secondary | ICD-10-CM | POA: Diagnosis not present

## 2022-02-27 DIAGNOSIS — K259 Gastric ulcer, unspecified as acute or chronic, without hemorrhage or perforation: Secondary | ICD-10-CM | POA: Diagnosis not present

## 2022-02-27 DIAGNOSIS — I4821 Permanent atrial fibrillation: Secondary | ICD-10-CM | POA: Diagnosis not present

## 2022-02-27 DIAGNOSIS — M4802 Spinal stenosis, cervical region: Secondary | ICD-10-CM | POA: Diagnosis not present

## 2022-02-27 DIAGNOSIS — I2510 Atherosclerotic heart disease of native coronary artery without angina pectoris: Secondary | ICD-10-CM | POA: Diagnosis not present

## 2022-02-27 DIAGNOSIS — I081 Rheumatic disorders of both mitral and tricuspid valves: Secondary | ICD-10-CM | POA: Diagnosis not present

## 2022-02-27 DIAGNOSIS — M19032 Primary osteoarthritis, left wrist: Secondary | ICD-10-CM | POA: Diagnosis not present

## 2022-02-27 DIAGNOSIS — D509 Iron deficiency anemia, unspecified: Secondary | ICD-10-CM | POA: Diagnosis not present

## 2022-02-27 DIAGNOSIS — N1832 Chronic kidney disease, stage 3b: Secondary | ICD-10-CM | POA: Diagnosis not present

## 2022-02-27 DIAGNOSIS — Z7982 Long term (current) use of aspirin: Secondary | ICD-10-CM | POA: Diagnosis not present

## 2022-02-27 DIAGNOSIS — Z7984 Long term (current) use of oral hypoglycemic drugs: Secondary | ICD-10-CM | POA: Diagnosis not present

## 2022-02-27 DIAGNOSIS — L97211 Non-pressure chronic ulcer of right calf limited to breakdown of skin: Secondary | ICD-10-CM | POA: Diagnosis not present

## 2022-02-27 DIAGNOSIS — E782 Mixed hyperlipidemia: Secondary | ICD-10-CM | POA: Diagnosis not present

## 2022-02-27 NOTE — Telephone Encounter (Signed)
New Message: ? ? ? ?Beth called and said she was unable to draw patient's blood today. She wants to know if he can come in tomorrow or Friday afternoon to get lab work here? ?

## 2022-02-27 NOTE — Telephone Encounter (Signed)
Spoke with Beth who states that the she was unable to draw labs today. States that the daughter can bring the patient by our office tomorrow. Patient has been scheduled. Eustaquio Maize will let the daughter know.  ?

## 2022-02-28 ENCOUNTER — Other Ambulatory Visit: Payer: Medicare Other

## 2022-02-28 DIAGNOSIS — Z792 Long term (current) use of antibiotics: Secondary | ICD-10-CM | POA: Diagnosis not present

## 2022-02-28 DIAGNOSIS — Z79899 Other long term (current) drug therapy: Secondary | ICD-10-CM | POA: Diagnosis not present

## 2022-02-28 DIAGNOSIS — I2510 Atherosclerotic heart disease of native coronary artery without angina pectoris: Secondary | ICD-10-CM | POA: Diagnosis not present

## 2022-02-28 DIAGNOSIS — D631 Anemia in chronic kidney disease: Secondary | ICD-10-CM | POA: Diagnosis not present

## 2022-02-28 DIAGNOSIS — I081 Rheumatic disorders of both mitral and tricuspid valves: Secondary | ICD-10-CM | POA: Diagnosis not present

## 2022-02-28 DIAGNOSIS — E1149 Type 2 diabetes mellitus with other diabetic neurological complication: Secondary | ICD-10-CM | POA: Diagnosis not present

## 2022-02-28 DIAGNOSIS — L97211 Non-pressure chronic ulcer of right calf limited to breakdown of skin: Secondary | ICD-10-CM | POA: Diagnosis not present

## 2022-02-28 DIAGNOSIS — I5032 Chronic diastolic (congestive) heart failure: Secondary | ICD-10-CM | POA: Diagnosis not present

## 2022-02-28 DIAGNOSIS — M2578 Osteophyte, vertebrae: Secondary | ICD-10-CM | POA: Diagnosis not present

## 2022-02-28 DIAGNOSIS — L97229 Non-pressure chronic ulcer of left calf with unspecified severity: Secondary | ICD-10-CM | POA: Diagnosis not present

## 2022-02-28 DIAGNOSIS — Z7984 Long term (current) use of oral hypoglycemic drugs: Secondary | ICD-10-CM | POA: Diagnosis not present

## 2022-02-28 DIAGNOSIS — Z7982 Long term (current) use of aspirin: Secondary | ICD-10-CM | POA: Diagnosis not present

## 2022-02-28 DIAGNOSIS — D62 Acute posthemorrhagic anemia: Secondary | ICD-10-CM | POA: Diagnosis not present

## 2022-02-28 DIAGNOSIS — I872 Venous insufficiency (chronic) (peripheral): Secondary | ICD-10-CM | POA: Diagnosis not present

## 2022-02-28 DIAGNOSIS — I130 Hypertensive heart and chronic kidney disease with heart failure and stage 1 through stage 4 chronic kidney disease, or unspecified chronic kidney disease: Secondary | ICD-10-CM | POA: Diagnosis not present

## 2022-02-28 DIAGNOSIS — K259 Gastric ulcer, unspecified as acute or chronic, without hemorrhage or perforation: Secondary | ICD-10-CM | POA: Diagnosis not present

## 2022-02-28 DIAGNOSIS — E1151 Type 2 diabetes mellitus with diabetic peripheral angiopathy without gangrene: Secondary | ICD-10-CM | POA: Diagnosis not present

## 2022-02-28 DIAGNOSIS — M47812 Spondylosis without myelopathy or radiculopathy, cervical region: Secondary | ICD-10-CM | POA: Diagnosis not present

## 2022-02-28 DIAGNOSIS — M19032 Primary osteoarthritis, left wrist: Secondary | ICD-10-CM | POA: Diagnosis not present

## 2022-02-28 DIAGNOSIS — N1832 Chronic kidney disease, stage 3b: Secondary | ICD-10-CM | POA: Diagnosis not present

## 2022-02-28 DIAGNOSIS — E782 Mixed hyperlipidemia: Secondary | ICD-10-CM | POA: Diagnosis not present

## 2022-02-28 DIAGNOSIS — M4802 Spinal stenosis, cervical region: Secondary | ICD-10-CM | POA: Diagnosis not present

## 2022-02-28 DIAGNOSIS — I4821 Permanent atrial fibrillation: Secondary | ICD-10-CM | POA: Diagnosis not present

## 2022-02-28 DIAGNOSIS — E1122 Type 2 diabetes mellitus with diabetic chronic kidney disease: Secondary | ICD-10-CM | POA: Diagnosis not present

## 2022-02-28 DIAGNOSIS — D509 Iron deficiency anemia, unspecified: Secondary | ICD-10-CM | POA: Diagnosis not present

## 2022-03-01 ENCOUNTER — Emergency Department (HOSPITAL_COMMUNITY): Payer: Medicare Other

## 2022-03-01 ENCOUNTER — Encounter (HOSPITAL_COMMUNITY): Payer: Self-pay

## 2022-03-01 ENCOUNTER — Inpatient Hospital Stay (HOSPITAL_COMMUNITY)
Admission: EM | Admit: 2022-03-01 | Discharge: 2022-03-07 | DRG: 291 | Disposition: A | Discharge status: Skilled Nursing Facility | Payer: Medicare Other | Attending: Internal Medicine | Admitting: Internal Medicine

## 2022-03-01 ENCOUNTER — Inpatient Hospital Stay (HOSPITAL_COMMUNITY): Payer: Medicare Other

## 2022-03-01 ENCOUNTER — Other Ambulatory Visit: Payer: Self-pay

## 2022-03-01 DIAGNOSIS — I10 Essential (primary) hypertension: Secondary | ICD-10-CM | POA: Diagnosis present

## 2022-03-01 DIAGNOSIS — E1151 Type 2 diabetes mellitus with diabetic peripheral angiopathy without gangrene: Secondary | ICD-10-CM | POA: Diagnosis present

## 2022-03-01 DIAGNOSIS — I482 Chronic atrial fibrillation, unspecified: Secondary | ICD-10-CM | POA: Diagnosis not present

## 2022-03-01 DIAGNOSIS — I340 Nonrheumatic mitral (valve) insufficiency: Secondary | ICD-10-CM | POA: Diagnosis not present

## 2022-03-01 DIAGNOSIS — R627 Adult failure to thrive: Secondary | ICD-10-CM | POA: Diagnosis present

## 2022-03-01 DIAGNOSIS — D631 Anemia in chronic kidney disease: Secondary | ICD-10-CM | POA: Diagnosis not present

## 2022-03-01 DIAGNOSIS — N182 Chronic kidney disease, stage 2 (mild): Secondary | ICD-10-CM | POA: Diagnosis not present

## 2022-03-01 DIAGNOSIS — I2510 Atherosclerotic heart disease of native coronary artery without angina pectoris: Secondary | ICD-10-CM | POA: Diagnosis not present

## 2022-03-01 DIAGNOSIS — E1122 Type 2 diabetes mellitus with diabetic chronic kidney disease: Secondary | ICD-10-CM | POA: Diagnosis present

## 2022-03-01 DIAGNOSIS — Z741 Need for assistance with personal care: Secondary | ICD-10-CM | POA: Diagnosis not present

## 2022-03-01 DIAGNOSIS — N179 Acute kidney failure, unspecified: Secondary | ICD-10-CM | POA: Diagnosis not present

## 2022-03-01 DIAGNOSIS — Z7189 Other specified counseling: Secondary | ICD-10-CM | POA: Diagnosis not present

## 2022-03-01 DIAGNOSIS — R778 Other specified abnormalities of plasma proteins: Secondary | ICD-10-CM

## 2022-03-01 DIAGNOSIS — R531 Weakness: Secondary | ICD-10-CM

## 2022-03-01 DIAGNOSIS — G9341 Metabolic encephalopathy: Secondary | ICD-10-CM | POA: Diagnosis present

## 2022-03-01 DIAGNOSIS — R410 Disorientation, unspecified: Secondary | ICD-10-CM | POA: Diagnosis not present

## 2022-03-01 DIAGNOSIS — I509 Heart failure, unspecified: Secondary | ICD-10-CM

## 2022-03-01 DIAGNOSIS — I38 Endocarditis, valve unspecified: Secondary | ICD-10-CM | POA: Diagnosis not present

## 2022-03-01 DIAGNOSIS — E785 Hyperlipidemia, unspecified: Secondary | ICD-10-CM | POA: Diagnosis present

## 2022-03-01 DIAGNOSIS — M25519 Pain in unspecified shoulder: Secondary | ICD-10-CM | POA: Diagnosis not present

## 2022-03-01 DIAGNOSIS — Z6824 Body mass index (BMI) 24.0-24.9, adult: Secondary | ICD-10-CM

## 2022-03-01 DIAGNOSIS — R4182 Altered mental status, unspecified: Secondary | ICD-10-CM

## 2022-03-01 DIAGNOSIS — E1169 Type 2 diabetes mellitus with other specified complication: Secondary | ICD-10-CM | POA: Diagnosis not present

## 2022-03-01 DIAGNOSIS — I5032 Chronic diastolic (congestive) heart failure: Secondary | ICD-10-CM | POA: Diagnosis not present

## 2022-03-01 DIAGNOSIS — R5381 Other malaise: Secondary | ICD-10-CM | POA: Diagnosis not present

## 2022-03-01 DIAGNOSIS — E119 Type 2 diabetes mellitus without complications: Secondary | ICD-10-CM

## 2022-03-01 DIAGNOSIS — R443 Hallucinations, unspecified: Secondary | ICD-10-CM | POA: Diagnosis present

## 2022-03-01 DIAGNOSIS — I5033 Acute on chronic diastolic (congestive) heart failure: Secondary | ICD-10-CM | POA: Diagnosis present

## 2022-03-01 DIAGNOSIS — Z833 Family history of diabetes mellitus: Secondary | ICD-10-CM

## 2022-03-01 DIAGNOSIS — Z9981 Dependence on supplemental oxygen: Secondary | ICD-10-CM

## 2022-03-01 DIAGNOSIS — I130 Hypertensive heart and chronic kidney disease with heart failure and stage 1 through stage 4 chronic kidney disease, or unspecified chronic kidney disease: Secondary | ICD-10-CM | POA: Diagnosis not present

## 2022-03-01 DIAGNOSIS — I2721 Secondary pulmonary arterial hypertension: Secondary | ICD-10-CM | POA: Diagnosis not present

## 2022-03-01 DIAGNOSIS — Z823 Family history of stroke: Secondary | ICD-10-CM

## 2022-03-01 DIAGNOSIS — I5031 Acute diastolic (congestive) heart failure: Secondary | ICD-10-CM | POA: Diagnosis present

## 2022-03-01 DIAGNOSIS — C61 Malignant neoplasm of prostate: Secondary | ICD-10-CM | POA: Diagnosis present

## 2022-03-01 DIAGNOSIS — N390 Urinary tract infection, site not specified: Secondary | ICD-10-CM | POA: Diagnosis present

## 2022-03-01 DIAGNOSIS — I872 Venous insufficiency (chronic) (peripheral): Secondary | ICD-10-CM | POA: Diagnosis present

## 2022-03-01 DIAGNOSIS — Z66 Do not resuscitate: Secondary | ICD-10-CM | POA: Diagnosis not present

## 2022-03-01 DIAGNOSIS — R0609 Other forms of dyspnea: Secondary | ICD-10-CM | POA: Diagnosis not present

## 2022-03-01 DIAGNOSIS — Z9842 Cataract extraction status, left eye: Secondary | ICD-10-CM

## 2022-03-01 DIAGNOSIS — Z8249 Family history of ischemic heart disease and other diseases of the circulatory system: Secondary | ICD-10-CM

## 2022-03-01 DIAGNOSIS — D7589 Other specified diseases of blood and blood-forming organs: Secondary | ICD-10-CM | POA: Diagnosis not present

## 2022-03-01 DIAGNOSIS — Z515 Encounter for palliative care: Secondary | ICD-10-CM | POA: Diagnosis not present

## 2022-03-01 DIAGNOSIS — D649 Anemia, unspecified: Secondary | ICD-10-CM | POA: Diagnosis not present

## 2022-03-01 DIAGNOSIS — I4819 Other persistent atrial fibrillation: Secondary | ICD-10-CM | POA: Diagnosis present

## 2022-03-01 DIAGNOSIS — M6259 Muscle wasting and atrophy, not elsewhere classified, multiple sites: Secondary | ICD-10-CM | POA: Diagnosis not present

## 2022-03-01 DIAGNOSIS — R54 Age-related physical debility: Secondary | ICD-10-CM | POA: Diagnosis present

## 2022-03-01 DIAGNOSIS — I110 Hypertensive heart disease with heart failure: Secondary | ICD-10-CM | POA: Diagnosis not present

## 2022-03-01 DIAGNOSIS — Z9841 Cataract extraction status, right eye: Secondary | ICD-10-CM

## 2022-03-01 DIAGNOSIS — R6510 Systemic inflammatory response syndrome (SIRS) of non-infectious origin without acute organ dysfunction: Secondary | ICD-10-CM | POA: Diagnosis not present

## 2022-03-01 DIAGNOSIS — Z953 Presence of xenogenic heart valve: Secondary | ICD-10-CM | POA: Diagnosis not present

## 2022-03-01 DIAGNOSIS — Z923 Personal history of irradiation: Secondary | ICD-10-CM

## 2022-03-01 DIAGNOSIS — E43 Unspecified severe protein-calorie malnutrition: Secondary | ICD-10-CM | POA: Diagnosis present

## 2022-03-01 DIAGNOSIS — Z743 Need for continuous supervision: Secondary | ICD-10-CM | POA: Diagnosis not present

## 2022-03-01 DIAGNOSIS — E440 Moderate protein-calorie malnutrition: Secondary | ICD-10-CM | POA: Diagnosis not present

## 2022-03-01 DIAGNOSIS — E872 Acidosis, unspecified: Secondary | ICD-10-CM | POA: Diagnosis present

## 2022-03-01 DIAGNOSIS — Z808 Family history of malignant neoplasm of other organs or systems: Secondary | ICD-10-CM

## 2022-03-01 DIAGNOSIS — Z961 Presence of intraocular lens: Secondary | ICD-10-CM | POA: Diagnosis present

## 2022-03-01 DIAGNOSIS — Z79899 Other long term (current) drug therapy: Secondary | ICD-10-CM

## 2022-03-01 DIAGNOSIS — R296 Repeated falls: Secondary | ICD-10-CM | POA: Diagnosis present

## 2022-03-01 DIAGNOSIS — Z880 Allergy status to penicillin: Secondary | ICD-10-CM

## 2022-03-01 DIAGNOSIS — I878 Other specified disorders of veins: Secondary | ICD-10-CM | POA: Diagnosis present

## 2022-03-01 DIAGNOSIS — E1165 Type 2 diabetes mellitus with hyperglycemia: Secondary | ICD-10-CM | POA: Diagnosis present

## 2022-03-01 DIAGNOSIS — L89152 Pressure ulcer of sacral region, stage 2: Secondary | ICD-10-CM | POA: Diagnosis not present

## 2022-03-01 DIAGNOSIS — Z800 Family history of malignant neoplasm of digestive organs: Secondary | ICD-10-CM

## 2022-03-01 DIAGNOSIS — Z7982 Long term (current) use of aspirin: Secondary | ICD-10-CM

## 2022-03-01 DIAGNOSIS — Z87891 Personal history of nicotine dependence: Secondary | ICD-10-CM

## 2022-03-01 DIAGNOSIS — I5030 Unspecified diastolic (congestive) heart failure: Secondary | ICD-10-CM | POA: Diagnosis present

## 2022-03-01 DIAGNOSIS — D689 Coagulation defect, unspecified: Secondary | ICD-10-CM | POA: Diagnosis not present

## 2022-03-01 DIAGNOSIS — I493 Ventricular premature depolarization: Secondary | ICD-10-CM | POA: Diagnosis present

## 2022-03-01 DIAGNOSIS — R2681 Unsteadiness on feet: Secondary | ICD-10-CM | POA: Diagnosis not present

## 2022-03-01 DIAGNOSIS — E46 Unspecified protein-calorie malnutrition: Secondary | ICD-10-CM | POA: Diagnosis present

## 2022-03-01 DIAGNOSIS — M6281 Muscle weakness (generalized): Secondary | ICD-10-CM | POA: Diagnosis not present

## 2022-03-01 DIAGNOSIS — I081 Rheumatic disorders of both mitral and tricuspid valves: Secondary | ICD-10-CM | POA: Diagnosis present

## 2022-03-01 LAB — CBC WITH DIFFERENTIAL/PLATELET
Abs Immature Granulocytes: 0.04 10*3/uL (ref 0.00–0.07)
Basophils Absolute: 0 10*3/uL (ref 0.0–0.1)
Basophils Relative: 1 %
Eosinophils Absolute: 0.1 10*3/uL (ref 0.0–0.5)
Eosinophils Relative: 1 %
HCT: 33.5 % — ABNORMAL LOW (ref 39.0–52.0)
Hemoglobin: 10.6 g/dL — ABNORMAL LOW (ref 13.0–17.0)
Immature Granulocytes: 1 %
Lymphocytes Relative: 7 %
Lymphs Abs: 0.6 10*3/uL — ABNORMAL LOW (ref 0.7–4.0)
MCH: 33.3 pg (ref 26.0–34.0)
MCHC: 31.6 g/dL (ref 30.0–36.0)
MCV: 105.3 fL — ABNORMAL HIGH (ref 80.0–100.0)
Monocytes Absolute: 0.8 10*3/uL (ref 0.1–1.0)
Monocytes Relative: 9 %
Neutro Abs: 7.2 10*3/uL (ref 1.7–7.7)
Neutrophils Relative %: 81 %
Platelets: 186 10*3/uL (ref 150–400)
RBC: 3.18 MIL/uL — ABNORMAL LOW (ref 4.22–5.81)
RDW: 16.5 % — ABNORMAL HIGH (ref 11.5–15.5)
WBC: 8.7 10*3/uL (ref 4.0–10.5)
nRBC: 0 % (ref 0.0–0.2)

## 2022-03-01 LAB — COMPREHENSIVE METABOLIC PANEL
ALT: 17 U/L (ref 0–44)
AST: 26 U/L (ref 15–41)
Albumin: 3.1 g/dL — ABNORMAL LOW (ref 3.5–5.0)
Alkaline Phosphatase: 76 U/L (ref 38–126)
Anion gap: 13 (ref 5–15)
BUN: 34 mg/dL — ABNORMAL HIGH (ref 8–23)
CO2: 30 mmol/L (ref 22–32)
Calcium: 9 mg/dL (ref 8.9–10.3)
Chloride: 99 mmol/L (ref 98–111)
Creatinine, Ser: 1.46 mg/dL — ABNORMAL HIGH (ref 0.61–1.24)
GFR, Estimated: 46 mL/min — ABNORMAL LOW (ref >60)
Glucose, Bld: 219 mg/dL — ABNORMAL HIGH (ref 70–99)
Potassium: 4 mmol/L (ref 3.5–5.1)
Sodium: 142 mmol/L (ref 135–145)
Total Bilirubin: 1.4 mg/dL — ABNORMAL HIGH (ref 0.3–1.2)
Total Protein: 6 g/dL — ABNORMAL LOW (ref 6.5–8.1)

## 2022-03-01 LAB — TROPONIN I (HIGH SENSITIVITY)
Troponin I (High Sensitivity): 26 ng/L — ABNORMAL HIGH (ref <18)
Troponin I (High Sensitivity): 31 ng/L — ABNORMAL HIGH (ref <18)

## 2022-03-01 LAB — TSH: TSH: 1.2 u[IU]/mL (ref 0.350–4.500)

## 2022-03-01 LAB — GLUCOSE, CAPILLARY: Glucose-Capillary: 305 mg/dL — ABNORMAL HIGH (ref 70–99)

## 2022-03-01 LAB — URINALYSIS, ROUTINE W REFLEX MICROSCOPIC
Bacteria, UA: NONE SEEN
Bilirubin Urine: NEGATIVE
Glucose, UA: >=500 mg/dL — AB
Hgb urine dipstick: NEGATIVE
Ketones, ur: NEGATIVE mg/dL
Leukocytes,Ua: NEGATIVE
Nitrite: NEGATIVE
Protein, ur: NEGATIVE mg/dL
Specific Gravity, Urine: 1.012 (ref 1.005–1.030)
pH: 6 (ref 5.0–8.0)

## 2022-03-01 LAB — LACTIC ACID, PLASMA
Lactic Acid, Venous: 1.8 mmol/L (ref 0.5–1.9)
Lactic Acid, Venous: 1.7 mmol/L (ref 0.5–1.9)

## 2022-03-01 LAB — SEDIMENTATION RATE: Sed Rate: 26 mm/hr — ABNORMAL HIGH (ref 0–16)

## 2022-03-01 LAB — C-REACTIVE PROTEIN: CRP: 3.7 mg/dL — ABNORMAL HIGH (ref <1.0)

## 2022-03-01 LAB — BRAIN NATRIURETIC PEPTIDE: B Natriuretic Peptide: 2092.5 pg/mL — ABNORMAL HIGH (ref 0.0–100.0)

## 2022-03-01 MED ORDER — ROSUVASTATIN CALCIUM 5 MG PO TABS
5.0000 mg | ORAL_TABLET | Freq: Every day | ORAL | Status: DC
  Administered 2022-03-01 – 2022-03-06 (×6): 5 mg via ORAL
  Filled 2022-03-01 (×6): qty 1

## 2022-03-01 MED ORDER — FUROSEMIDE 10 MG/ML IJ SOLN
40.0000 mg | Freq: Two times a day (BID) | INTRAMUSCULAR | Status: DC
  Administered 2022-03-01 (×2): 40 mg via INTRAVENOUS
  Filled 2022-03-01 (×2): qty 4

## 2022-03-01 MED ORDER — PANTOPRAZOLE SODIUM 40 MG PO TBEC
40.0000 mg | DELAYED_RELEASE_TABLET | Freq: Every day | ORAL | Status: DC
  Administered 2022-03-01 – 2022-03-07 (×7): 40 mg via ORAL
  Filled 2022-03-01 (×7): qty 1

## 2022-03-01 MED ORDER — INSULIN ASPART 100 UNIT/ML IJ SOLN
0.0000 [IU] | Freq: Every day | INTRAMUSCULAR | Status: DC
  Administered 2022-03-01: 4 [IU] via SUBCUTANEOUS
  Administered 2022-03-03: 3 [IU] via SUBCUTANEOUS
  Administered 2022-03-04 – 2022-03-06 (×2): 2 [IU] via SUBCUTANEOUS

## 2022-03-01 MED ORDER — SODIUM CHLORIDE 0.9% FLUSH
3.0000 mL | Freq: Two times a day (BID) | INTRAVENOUS | Status: DC
  Administered 2022-03-01 – 2022-03-06 (×12): 3 mL via INTRAVENOUS

## 2022-03-01 MED ORDER — ASPIRIN EC 81 MG PO TBEC
81.0000 mg | DELAYED_RELEASE_TABLET | Freq: Every day | ORAL | Status: DC
  Administered 2022-03-01 – 2022-03-07 (×7): 81 mg via ORAL
  Filled 2022-03-01 (×7): qty 1

## 2022-03-01 MED ORDER — VANCOMYCIN HCL 750 MG/150ML IV SOLN: 750.0000 mg | INTRAVENOUS | Status: DC

## 2022-03-01 MED ORDER — ACETAMINOPHEN 325 MG PO TABS
650.0000 mg | ORAL_TABLET | ORAL | Status: DC | PRN
  Administered 2022-03-02 – 2022-03-03 (×2): 650 mg via ORAL
  Filled 2022-03-01 (×2): qty 2

## 2022-03-01 MED ORDER — GLUCERNA SHAKE PO LIQD
237.0000 mL | Freq: Three times a day (TID) | ORAL | Status: DC
  Administered 2022-03-01 – 2022-03-07 (×17): 237 mL via ORAL
  Filled 2022-03-01: qty 237

## 2022-03-01 MED ORDER — ADULT MULTIVITAMIN W/MINERALS CH
1.0000 | ORAL_TABLET | Freq: Every day | ORAL | Status: DC
  Administered 2022-03-02 – 2022-03-07 (×6): 1 via ORAL
  Filled 2022-03-01 (×6): qty 1

## 2022-03-01 MED ORDER — SODIUM CHLORIDE 0.9 % IV SOLN: 250.0000 mL | INTRAVENOUS | Status: DC | PRN

## 2022-03-01 MED ORDER — ENSURE ENLIVE PO LIQD: 237.0000 mL | Freq: Two times a day (BID) | ORAL | Status: DC

## 2022-03-01 MED ORDER — LORATADINE 10 MG PO TABS
10.0000 mg | ORAL_TABLET | Freq: Every day | ORAL | Status: DC
Start: 2022-03-01 — End: 2022-03-08
  Administered 2022-03-01 – 2022-03-07 (×7): 10 mg via ORAL
  Filled 2022-03-01 (×7): qty 1

## 2022-03-01 MED ORDER — ONDANSETRON HCL 4 MG/2ML IJ SOLN: 4.0000 mg | Freq: Four times a day (QID) | INTRAMUSCULAR | Status: DC | PRN

## 2022-03-01 MED ORDER — GABAPENTIN 100 MG PO CAPS
100.0000 mg | ORAL_CAPSULE | Freq: Every day | ORAL | Status: DC
  Administered 2022-03-01 – 2022-03-06 (×6): 100 mg via ORAL
  Filled 2022-03-01 (×6): qty 1

## 2022-03-01 MED ORDER — INSULIN ASPART 100 UNIT/ML IJ SOLN
0.0000 [IU] | Freq: Three times a day (TID) | INTRAMUSCULAR | Status: DC
  Administered 2022-03-02: 3 [IU] via SUBCUTANEOUS
  Administered 2022-03-02: 7 [IU] via SUBCUTANEOUS
  Administered 2022-03-02 – 2022-03-03 (×2): 5 [IU] via SUBCUTANEOUS
  Administered 2022-03-03: 2 [IU] via SUBCUTANEOUS
  Administered 2022-03-04: 5 [IU] via SUBCUTANEOUS
  Administered 2022-03-04: 7 [IU] via SUBCUTANEOUS
  Administered 2022-03-04: 1 [IU] via SUBCUTANEOUS
  Administered 2022-03-05 – 2022-03-06 (×3): 3 [IU] via SUBCUTANEOUS
  Administered 2022-03-06: 2 [IU] via SUBCUTANEOUS
  Administered 2022-03-07: 3 [IU] via SUBCUTANEOUS

## 2022-03-01 MED ORDER — SODIUM CHLORIDE 0.9 % IV SOLN
2.0000 g | Freq: Two times a day (BID) | INTRAVENOUS | Status: DC
  Administered 2022-03-02: 2 g via INTRAVENOUS
  Filled 2022-03-01: qty 12.5

## 2022-03-01 MED ORDER — SODIUM CHLORIDE 0.9 % IV SOLN
2.0000 g | Freq: Once | INTRAVENOUS | Status: AC
  Administered 2022-03-01: 2 g via INTRAVENOUS
  Filled 2022-03-01: qty 12.5

## 2022-03-01 MED ORDER — VANCOMYCIN HCL 1250 MG/250ML IV SOLN
1250.0000 mg | Freq: Once | INTRAVENOUS | Status: AC
  Administered 2022-03-02: 1250 mg via INTRAVENOUS
  Filled 2022-03-01: qty 250

## 2022-03-01 MED ORDER — METRONIDAZOLE 500 MG/100ML IV SOLN
500.0000 mg | Freq: Two times a day (BID) | INTRAVENOUS | Status: DC
  Administered 2022-03-01 – 2022-03-02 (×2): 500 mg via INTRAVENOUS
  Filled 2022-03-01 (×2): qty 100

## 2022-03-01 MED ORDER — SODIUM CHLORIDE 0.9% FLUSH: 3.0000 mL | INTRAVENOUS | Status: DC | PRN

## 2022-03-01 MED ORDER — ENOXAPARIN SODIUM 40 MG/0.4ML IJ SOSY
40.0000 mg | PREFILLED_SYRINGE | INTRAMUSCULAR | Status: DC
  Administered 2022-03-01 – 2022-03-07 (×7): 40 mg via SUBCUTANEOUS
  Filled 2022-03-01 (×7): qty 0.4

## 2022-03-01 MED ORDER — FLUTICASONE PROPIONATE 50 MCG/ACT NA SUSP
1.0000 | Freq: Every day | NASAL | Status: DC
  Administered 2022-03-02 – 2022-03-07 (×6): 1 via NASAL
  Filled 2022-03-01 (×2): qty 16

## 2022-03-01 MED ORDER — METOPROLOL SUCCINATE ER 25 MG PO TB24
12.5000 mg | ORAL_TABLET | Freq: Every day | ORAL | Status: DC
Start: 2022-03-01 — End: 2022-03-08
  Administered 2022-03-01 – 2022-03-07 (×7): 12.5 mg via ORAL
  Filled 2022-03-01 (×7): qty 1

## 2022-03-01 MED ORDER — FUROSEMIDE 10 MG/ML IJ SOLN
20.0000 mg | Freq: Once | INTRAMUSCULAR | Status: AC
  Administered 2022-03-01: 20 mg via INTRAVENOUS
  Filled 2022-03-01: qty 2

## 2022-03-01 NOTE — Consult Note (Signed)
?Cardiology Consultation:  ? ?Patient ID: [name] ?MRN: [id number]; DOB: 1933/05/02 ? ?Admit date: 03/01/2022 ?Date of Consult: 03/01/2022 ? ?PCP:  [name], MD ?  ?[facility] Providers ?Cardiologist:  [name], MD      ? ? ?Patient Profile:  ? ?KHALIEL MOREY is a 88 y.o. male with a hx of aortic stenosis who is being seen 03/01/2022 for the evaluation of CHF at the request of Dr Tamala Julian. ? ?History of Present Illness:  ? ?Mr. Carie underwent transapical TAVR in 2014.  He has persistent atrial fibrillation and has not been anticoagulated because of frequent falls and recurrent gastrointestinal bleeding.  Comorbid conditions include heart failure with preserved ejection fraction, stage III chronic kidney disease, hypertension, pulmonary hypertension, severe mitral regurgitation, and peripheral arterial disease. ? ?The patient is independently interviewed but he isn't able to provide much history. I talked to his daughter over the phone and obtained history from her. He is brought into the hospital because of confusion and decreased mental status. He has been sleeping a lot and hasn't been ambulatory since returning home from a rehab facility on March 30.  The patient's daughter found him in his chair after urinating on himself and he was unable to get up.  He has had increased lower extremity edema but this improved with increasing his oral diuretics.  He has not reported any chest pain of late. ? ? ?Past Medical History:  ?Diagnosis Date  ? Anemia   ? LOW PLATELETS OTHER DAY  PER PT  ? Anticoagulated on Coumadin 12/21/2012  ? Aortic stenosis   ? Arthritis   ? "left wrist; back sometimes" (01/21/2013)  ? Atrial fibrillation ([place])   ? [name], DR Burt Knack  ? Bradycardia   ? Exertional shortness of breath   ? Heart murmur   ? "I've had it for years; runs in the family on daddy's side" (01/21/2013)  ? HTN (hypertension)   ? Hyperlipemia 12/21/2012  ? Prostate cancer ([place])   ? "had 40 tx of  radiation in 2009" (01/21/2013)  ? S/P aortic valve replacement with bioprosthetic valve 01/19/2013  ? Transcatheter Aortic Valve Replacement using 29m Sapien bioprosthetic tissue valve via transapical approach  ? Type II diabetes mellitus ([place]   ? ? ?Past Surgical History:  ?Procedure Laterality Date  ? BALLOON DILATION N/A 08/02/2020  ? Procedure: BALLOON DILATION;  Surgeon: [name] MD;  [facility];  Service: Gastroenterology;  Laterality: N/A;  ? BIOPSY  10/03/2021  ? Procedure: BIOPSY;  Surgeon: [name] MD;  [facility];  Service: Endoscopy;;  ? BIOPSY  12/14/2021  ? Procedure: BIOPSY;  Surgeon: [name] MD;  [facility] [facility]  Service: Endoscopy;;  ? CARDIAC CATHETERIZATION  12/16/2012  ? CARDIAC VALVE REPLACEMENT  01/19/2013  ? AVR  ? CATARACT EXTRACTION W/ INTRAOCULAR LENS  IMPLANT, BILATERAL    ? ESOPHAGOGASTRODUODENOSCOPY N/A 10/03/2021  ? Procedure: ESOPHAGOGASTRODUODENOSCOPY (EGD);  Surgeon: [name] MD;  [facility];  Service: Endoscopy;  Laterality: N/A;  ? ESOPHAGOGASTRODUODENOSCOPY N/A 12/14/2021  ? Procedure: ESOPHAGOGASTRODUODENOSCOPY (EGD);  Surgeon: [name] MD;  [facility]  Service: [place];  Laterality: N/A;  ? ESOPHAGOGASTRODUODENOSCOPY (EGD) WITH PROPOFOL N/A 08/02/2020  ? Procedure: ESOPHAGOGASTRODUODENOSCOPY (EGD) WITH PROPOFOL;  Surgeon: [name] MD;  [facility];  Service: Gastroenterology;  Laterality: N/A;  ? EYE SURGERY    ? INTRAOPERATIVE TRANSESOPHAGEAL ECHOCARDIOGRAM N/A 01/19/2013  ? Procedure: INTRAOPERATIVE TRANSESOPHAGEAL ECHOCARDIOGRAM;  Surgeon: CRexene Alberts MD;  [facility];  Service: Open Heart Surgery;  Laterality: N/A;  ? ORIF FOREARM FRACTURE Left 1954  ? "compound fx" (01/21/2013)  ? TRANSTHORACIC ECHOCARDIOGRAM  09/04/10, 09/07/08  ?  ? ?Home Medications:  ?Prior to Admission medications   ?Medication Sig Start Date End Date Taking? Authorizing Provider  ?acetaminophen  (TYLENOL) 650 MG CR tablet Take 650 mg by mouth daily as needed for pain.   Yes [provider]  ?aspirin EC 81 MG tablet Take 1 tablet (81 mg total) by mouth daily. Swallow whole. 12/18/21 03/18/22 Yes Kayleen Memos, DO  ?B Complex Vitamins (B COMPLEX 50) TABS Take 1 tablet by mouth daily with breakfast.   Yes [provider]  ?bicalutamide (CASODEX) 50 MG tablet Take 50 mg by mouth See admin instructions. Take 50 mg by mouth in the afternoon 01/25/22  Yes [provider]  ?cetirizine (ZYRTEC) 10 MG tablet Take 1 tablet (10 mg total) by mouth daily. 01/31/22  Yes Medina-Vargas, Monina C, NP  ?cholecalciferol (VITAMIN D3) 25 MCG (1000 UNIT) tablet Take 1,000 Units by mouth 2 (two) times daily with a meal.   Yes [provider]  ?dapagliflozin propanediol (FARXIGA) 10 MG TABS tablet Take 10 mg by mouth daily.   Yes [provider]  ?ferrous sulfate 325 (65 FE) MG tablet Take 1 tablet (325 mg total) by mouth 2 (two) times daily with a meal. 01/31/22 05/01/22 Yes Medina-Vargas, Monina C, NP  ?fluticasone (FLONASE) 50 MCG/ACT nasal spray Place 1 spray into both nostrils daily. 02/02/22  Yes Medina-Vargas, Monina C, NP  ?gabapentin (NEURONTIN) 100 MG capsule Take 1 capsule (100 mg total) by mouth at bedtime. 01/31/22  Yes Medina-Vargas, [place], NP  ?lisinopril (ZESTRIL) 5 MG tablet Take 1 tablet (5 mg total) by mouth daily. 01/31/22  Yes Medina-Vargas, Monina C, NP  ?metFORMIN (GLUCOPHAGE) 1000 MG tablet Take 1 tablet (1,000 mg total) by mouth 2 (two) times daily with breakfast and lunch. 01/31/22  Yes Medina-Vargas, Monina C, NP  ?metoprolol succinate (TOPROL-XL) 25 MG 24 hr tablet Take 0.5 tablets (12.5 mg total) by mouth daily. 01/31/22 03/02/22 Yes Medina-Vargas, Monina C, NP  ?OVER THE COUNTER MEDICATION Take 200 mg by mouth daily. Selenium 200 mg   Yes [provider]  ?pantoprazole (PROTONIX) 40 MG tablet Take 1 tablet (40 mg total) by mouth daily. Take  1 tab of 40 mg  tab  by mouth twice a day till 02/16/22 then 1 tab of 40 mg by mouth daily starting 02/17/22 ?Patient taking differently: Take 40 mg by mouth daily. 02/17/22  Yes Medina-Vargas, Monina C, NP  ?potassium chloride SA (KLOR-CON M20) 20 MEQ tablet Take 2 tablets (40 mEq total) by mouth daily. ?Patient taking differently: Take 40 mEq by mouth See admin instructions. Take 40 mEq by mouth in the afternoon. 02/21/22  Yes Chandrasekhar, Mahesh A, MD  ?rosuvastatin (CRESTOR) 5 MG tablet TAKE 1 TABLET BY MOUTH ONCE DAILY AT  6PM ?Patient taking differently: Take 5 mg by mouth at bedtime. 01/31/22  Yes Medina-Vargas, Monina C, NP  ?torsemide (DEMADEX) 20 MG tablet Take 2 tablets (40 mg total) by mouth 2 (two) times daily. 02/20/22  Yes Chandrasekhar, Mahesh A, MD  ?VITAMIN A PO Take 1 tablet by mouth daily.   Yes [provider]  ?vitamin B-12 (CYANOCOBALAMIN) 1000 MCG tablet Take 1 tablet (1,000 mcg total) by mouth 2 (two) times daily with a meal. 01/31/22  Yes Medina-Vargas, Monina C, NP  ?Amino Acids-Protein Hydrolys (PRO-STAT)  LIQD Take 30 mLs by mouth 2 (two) times daily. ?Patient not taking: Reported on 03/01/2022    [provider]  ?bisacodyl (DULCOLAX) 10 MG suppository If not relieved by MOM, give 10 mg Bisacodyl suppositiory rectally X 1 dose in 24 hours as needed ?Patient not taking: Reported on 03/01/2022    [provider]  ?enzalutamide Gillermina Phy) 80 MG tablet Take 2 tablets (160 mg total) by mouth daily. 2 pm for prostate cancer ?Patient not taking: Reported on 03/01/2022 01/31/22   Medina-Vargas, Senaida Lange, NP  ?Magnesium Hydroxide (MILK OF MAGNESIA PO) Take by mouth. If no BM in 3 days, give 30 cc Milk of Magnesium p.o. x 1 dose in 24 hours as needed (Do not use standing constipation orders for residents with renal failure CFR less than 30. Contact MD for orders) ?Patient not taking: Reported on 03/01/2022    [provider]  ?Nutritional Supplements (NUTRITIONAL SUPPLEMENT PO) Take by  mouth. MAGIC CUP TWICE DAILY FOR LOW ALBUMIN/PROTEIN ?Patient not taking: Reported on 03/01/2022    [provider]  ?Nystatin (GERHARDT'S BUTT CREAM) CREA Apply 1 application topically daily as

## 2022-03-01 NOTE — Progress Notes (Signed)
Pharmacy Antibiotic Note ? ?[name] is a 88 y.o. male admitted on 03/01/2022 with suspected sepsis.  Pharmacy has been consulted for Vancomycin and Cefepime dosing. Blood and urine cultures sent. WBC is within normal limis. Tm 99.4. ESR/CRP elevated. Lactic acid is pending. Completed Keflex course 2 days prior for UTI. Patient is altered. Penicillin allergy noted but tolerated Keflex without issues.  ? ?Plan: ?Vancomycin 1250 mg IV x1 then 741m IV every 24 hours. eAUC 492, SCr used 1.46.  ?Cefepime 2g IV x1 then 2g IV every 12 hours.  ?Also on Metronidazole per MD ?Monitor renal function ? ?Height: '5\' 8"'  (172.7 cm) ?Weight: 64.1 kg (141 lb 6.4 oz) ?IBW/kg (Calculated) : 68.4 ? ?Temp (24hrs), Avg:98.7 ?F (37.1 ?C), Min:98.3 ?F (36.8 ?C), Max:99.4 ?F (37.4 ?C) ? ?Recent Labs  ?Lab 03/01/22 ?0847  ?WBC 8.7  ?CREATININE 1.46*  ?  ?Estimated Creatinine Clearance: 32.3 mL/min (A) (by C-G formula based on SCr of 1.46 mg/dL (H)).   ? ?Allergies  ?Allergen Reactions  ? Amoxicillin Other (See Comments)  ?  Burning of mouth, tongue and lips. ? itchy, burning throat  ? ? ?Antimicrobials this admission: ?Vanc 4/28 >> ?Cefepime 4/28 >> ?Flagyl 4/28 >> ? ?Dose adjustments this admission: ? ? ?Microbiology results: ?pending ? ?Thank you for allowing pharmacy to be a part of this patient?s care. ? ?JSloan Leiter PharmD, BCPS, BCCCP ?Clinical Pharmacist ?Please refer to [facility] ?03/01/2022 6:17 PM ? ?

## 2022-03-01 NOTE — ED Triage Notes (Addendum)
Pt's daughter at bedside reports pt has been intermittently hallucinating x few days and talking about the [employer] barbecuing his ribs. Pt's daughter reports pt lives along w/ home health. ?

## 2022-03-01 NOTE — Progress Notes (Signed)
Initial Nutrition Assessment ? ?DOCUMENTATION CODES:  ? ?Severe malnutrition in context of chronic illness ? ?INTERVENTION:  ? ?Glucerna Shake po TID, each supplement provides 220 kcal and 10 grams of protein ? ?Magic cup TID with meals, each supplement provides 290 kcal and 9 grams of protein ? ?MVI po daily  ? ?Liberalize diet- mechanical soft  ? ?Pt at high refeed risk; recommend monitor potassium, magnesium and phosphorus labs daily until stable ? ?NUTRITION DIAGNOSIS:  ? ?Severe Malnutrition related to cancer and cancer related treatments as evidenced by severe fat depletion, severe muscle depletion, percent weight loss. ? ?GOAL:  ? ?Patient will meet greater than or equal to 90% of their needs ? ?MONITOR:  ? ?PO intake, Supplement acceptance, Labs, Weight trends, Skin, I & O's ? ?REASON FOR ASSESSMENT:  ? ?Malnutrition Screening Tool ?  ? ?ASSESSMENT:  ? ?88 y/o male with h/o CHF, HTN, Afib, HLD, prosate cancer, Aortic stenosis, DM, CAD and gastric ulcers who is admitted with generalized weakness and intermittent confusion ? ?Met with pt and pt's daughter in room today. Pt is unable to provide nutrition related history so history obtained from pt's daughter at bedside. Daughter reports pt with poor appetite and oral intake for the past several months. Daughter reports that pt will eat if she comes over an encourages him and prepares meals. Pt has been drinking chocolate Glucerna at home. Of note, pt is edentulous and does not have his dentures here in hospital. Pt's lunch tray was sitting in his side table untouched; daughter reports that pt did not eat breakfast either but only drank some water. Per chart, pt is down 28lbs(18%) over the past two months; this is severe weight loss. Daughter reports that pt's UBW is ~160lbs. RD will add supplements and MV to help pt meet his estimated needs. Pt is likely at refeed risk.  ? ?Medications reviewed and include: lovenox, lasix ? ?Labs reviewed: K 4.0 wnl, BUN 34(H),  creat 1.46(H) ?BNP- 2092(H) ?Hgb 10.6(L), Hct 33.5(L) ?Cbgs- 272, 171 x 24 hrs ?AIC 5.4- 2/11 ? ?NUTRITION - FOCUSED PHYSICAL EXAM: ? ?Flowsheet Row Most Recent Value  ?Orbital Region Moderate depletion  ?Upper Arm Region Severe depletion  ?Thoracic and Lumbar Region Severe depletion  ?Buccal Region Moderate depletion  ?Temple Region Moderate depletion  ?Clavicle Bone Region Severe depletion  ?Clavicle and Acromion Bone Region Severe depletion  ?Scapular Bone Region Moderate depletion  ?Dorsal Hand Moderate depletion  ?Patellar Region Severe depletion  ?Anterior Thigh Region Severe depletion  ?Posterior Calf Region Severe depletion  ?Edema (RD Assessment) None  ?Hair Reviewed  ?Eyes Reviewed  ?Mouth Reviewed  ?Skin Reviewed  ?Nails Reviewed  ? ?Diet Order:   ?Diet Order   ? ?       ?  DIET DYS 3 Room service appropriate? No; Fluid consistency: Thin; Fluid restriction: 1800 mL Fluid  Diet effective now       ?  ? ?  ?  ? ?  ? ?EDUCATION NEEDS:  ? ?No education needs have been identified at this time ? ?Skin:  Skin Assessment: Reviewed RN Assessment ? ?Last BM:  4/27 ? ?Height:  ? ?Ht Readings from Last 1 Encounters:  ?03/01/22 '5\' 8"'  (1.727 m)  ? ? ?Weight:  ? ?Wt Readings from Last 1 Encounters:  ?03/01/22 64.1 kg  ? ? ?Ideal Body Weight:  70 kg ? ?BMI:  Body mass index is 21.5 kg/m?. ? ?Estimated Nutritional Needs:  ? ?Kcal:  1700-1900kcal/day ? ?Protein:  85-95g/day ? ?  Fluid:  1.7-1.9L/day ? ?Koleen Distance MS, RD, LDN ?Please refer to AMION for RD and/or RD on-call/weekend/after hours pager ? ?

## 2022-03-01 NOTE — ED Notes (Signed)
1 set of blood cultures collected at this time. 

## 2022-03-01 NOTE — H&P (Signed)
?History and Physical  ? ? ?Patient: [name] OAC:[id number] DOB: 1933/01/25 ?DOA: 03/01/2022 ?DOS: the patient was seen and examined on 03/01/2022 ?PCP: [name], MD  ?Patient coming from: Home ? ?Chief Complaint:  ?Chief Complaint  ?Patient presents with  ? Failure To Thrive  ? ?HPI: [name] is a 88 y.o. male with medical history significant of for coronary artery calcification, severe aortic stenosis status post TAVR, HFpEF, moderate mitral regurgitation, moderate tricuspid regurgitation, type 2 diabetes, essential hypertension, persistent atrial fibrillation not on oral anticoagulation, peripheral artery disease, CKD 3B, prior GI bleed with gastric ulcers, history of H. pylori infection, and remote history of tobacco abuse presents with complaints of weakness.  History is obtained from the patient's daughter who is present at bedside.  He had recently been advised to double up on his Torsemide to 40 mg twice daily by his cardiologist on 4/19.  Since that time patient had improvement in the swelling of his lower extremities, but over the last couple of days it become more altered.  Daughter notes he was stating that the patient had been reaching for things that were not there on the floor and saying weird things.  She notes that ever since patient was hospitalized back in February, he has been sleeping more.  He had just recently finished a course of cephalexin for urinary tract infection 2 days ago.   ? ?Upon admission into the emergency department patient was seen to be afebrile with heart rates 93-1 15, respirations 17-30, blood pressures 97/71 -115/65, and O2 saturation currently maintained on room air. Labs significant for hemoglobin 10.6 g/dL, BUN 34, creatinine 1.46, total bilirubin 1.4, BNP 2092.5, and troponin 26.  Chest x-ray noted low lung volumes with probable small new right pleural effusion and stable cardiomegaly.  Patient had been given Lasix 20 mg IV x1 dose. ? ? ?Review of  Systems: unable to review all systems due to the inability of the patient to answer questions. ?Past Medical History:  ?Diagnosis Date  ? Anemia   ? LOW PLATELETS OTHER DAY  PER PT  ? Anticoagulated on Coumadin 12/21/2012  ? Aortic stenosis   ? Arthritis   ? "left wrist; back sometimes" (01/21/2013)  ? Atrial fibrillation ([place])   ? [name], DR Burt Knack  ? Bradycardia   ? Exertional shortness of breath   ? Heart murmur   ? "I've had it for years; runs in the family on daddy's side" (01/21/2013)  ? HTN (hypertension)   ? Hyperlipemia 12/21/2012  ? Prostate cancer ([place])   ? "had 40 tx of radiation in 2009" (01/21/2013)  ? S/P aortic valve replacement with bioprosthetic valve 01/19/2013  ? Transcatheter Aortic Valve Replacement using 91m Sapien bioprosthetic tissue valve via transapical approach  ? Type II diabetes mellitus ([place]   ? ?Past Surgical History:  ?Procedure Laterality Date  ? BALLOON DILATION N/A 08/02/2020  ? Procedure: BALLOON DILATION;  Surgeon: [name] MD;  [facility];  Service: Gastroenterology;  Laterality: N/A;  ? BIOPSY  10/03/2021  ? Procedure: BIOPSY;  Surgeon: [name] MD;  [facility];  Service: Endoscopy;;  ? BIOPSY  12/14/2021  ? Procedure: BIOPSY;  Surgeon: [name] MD;  [facility]  Service: Endoscopy;;  ? CARDIAC CATHETERIZATION  12/16/2012  ? CARDIAC VALVE REPLACEMENT  01/19/2013  ? AVR  ? CATARACT EXTRACTION W/ INTRAOCULAR LENS  IMPLANT, BILATERAL    ? ESOPHAGOGASTRODUODENOSCOPY N/A 10/03/2021  ? Procedure: ESOPHAGOGASTRODUODENOSCOPY (EGD);  Surgeon: MWatt Climes  Altamese Dilling, MD;  [facility];  Service: Endoscopy;  Laterality: N/A;  ? ESOPHAGOGASTRODUODENOSCOPY N/A 12/14/2021  ? Procedure: ESOPHAGOGASTRODUODENOSCOPY (EGD);  Surgeon: Wilford Corner, MD;  Location: [place];  Service: Endoscopy;  Laterality: N/A;  ? ESOPHAGOGASTRODUODENOSCOPY (EGD) WITH PROPOFOL N/A 08/02/2020  ? Procedure: ESOPHAGOGASTRODUODENOSCOPY (EGD) WITH PROPOFOL;   Surgeon: [name], MD;  [facility];  Service: Gastroenterology;  Laterality: N/A;  ? EYE SURGERY    ? INTRAOPERATIVE TRANSESOPHAGEAL ECHOCARDIOGRAM N/A 01/19/2013  ? Procedure: INTRAOPERATIVE TRANSESOPHAGEAL ECHOCARDIOGRAM;  Surgeon: [name], MD;  Location: [place];  Service: Open Heart Surgery;  Laterality: N/A;  ? ORIF FOREARM FRACTURE Left 1954  ? "compound fx" (01/21/2013)  ? TRANSTHORACIC ECHOCARDIOGRAM  09/04/10, 09/07/08  ? ?Social History:  reports that he quit smoking about 45 years ago. His smoking use included cigarettes. He has a 44.00 pack-year smoking history. He has never used smokeless tobacco. He reports that he does not drink alcohol and does not use drugs. ? ?Allergies  ?Allergen Reactions  ? Amoxicillin Other (See Comments)  ?  Burning of mouth, tongue and lips. ? itchy, burning throat  ? ? ?Family History  ?Problem Relation Age of Onset  ? Diabetes Mother   ? Melanoma Mother 19  ? Bone cancer Mother 6  ? Pancreatic cancer Father 33  ? Heart disease Father   ? Stroke Sister   ? Cancer Brother 74  ?     ? brain cancer  ? ? ?Prior to Admission medications   ?Medication Sig Start Date End Date Taking? Authorizing Provider  ?acetaminophen (TYLENOL) 650 MG CR tablet Take 650 mg by mouth daily with breakfast.    [provider]  ?Amino Acids-Protein Hydrolys (PRO-STAT) LIQD Take 30 mLs by mouth 2 (two) times daily.    [provider]  ?aspirin EC 81 MG tablet Take 1 tablet (81 mg total) by mouth daily. Swallow whole. 12/18/21 03/18/22  Kayleen Memos, DO  ?B Complex Vitamins (B COMPLEX 50) TABS Take 1 tablet by mouth daily with breakfast.    [provider]  ?bisacodyl (DULCOLAX) 10 MG suppository If not relieved by MOM, give 10 mg Bisacodyl suppositiory rectally X 1 dose in 24 hours as needed    [provider]  ?cetirizine (ZYRTEC) 10 MG tablet Take 1 tablet (10 mg total) by mouth daily. 01/31/22   Medina-Vargas, Monina C, NP  ?cholecalciferol  (VITAMIN D3) 25 MCG (1000 UNIT) tablet Take 1,000 Units by mouth 2 (two) times daily with a meal.    [provider]  ?enzalutamide (XTANDI) 80 MG tablet Take 2 tablets (160 mg total) by mouth daily. 2 pm for prostate cancer 01/31/22   Medina-Vargas, Monina C, NP  ?ferrous sulfate 325 (65 FE) MG tablet Take 1 tablet (325 mg total) by mouth 2 (two) times daily with a meal. 01/31/22 05/01/22  Medina-Vargas, Monina C, NP  ?fluticasone (FLONASE SENSIMIST) 27.5 MCG/SPRAY nasal spray Place 2 sprays into the nose daily. 01/28/22 02/03/22  [provider]  ?fluticasone (FLONASE) 50 MCG/ACT nasal spray Place 1 spray into both nostrils daily. 02/02/22   Medina-Vargas, Monina C, NP  ?gabapentin (NEURONTIN) 100 MG capsule Take 1 capsule (100 mg total) by mouth at bedtime. 01/31/22   Medina-Vargas, Senaida Lange, NP  ?lisinopril (ZESTRIL) 5 MG tablet Take 1 tablet (5 mg total) by mouth daily. 01/31/22   Medina-Vargas, Senaida Lange, NP  ?Magnesium Hydroxide (MILK OF MAGNESIA PO) Take by mouth. If no BM in 3 days, give 30  cc Milk of Magnesium p.o. x 1 dose in 24 hours as needed (Do not use standing constipation orders for residents with renal failure CFR less than 30. Contact MD for orders)    [provider]  ?metFORMIN (GLUCOPHAGE) 1000 MG tablet Take 1 tablet (1,000 mg total) by mouth 2 (two) times daily with breakfast and lunch. 01/31/22   Medina-Vargas, Monina C, NP  ?metoprolol succinate (TOPROL-XL) 25 MG 24 hr tablet Take 0.5 tablets (12.5 mg total) by mouth daily. 01/31/22 03/02/22  Medina-Vargas, Senaida Lange, NP  ?Nutritional Supplements (NUTRITIONAL SUPPLEMENT PO) Take by mouth. MAGIC CUP TWICE DAILY FOR LOW ALBUMIN/PROTEIN    [provider]  ?Nystatin (GERHARDT'S BUTT CREAM) CREA Apply 1 application topically daily as needed for irritation. 12/18/21   Kayleen Memos, DO  ?OXYGEN 2L/min via [place] continuously    [provider]  ?pantoprazole (PROTONIX) 40 MG tablet Take 1 tablet (40 mg total) by mouth 2  (two) times daily for 16 days. Take  1 tab of 40 mg tab  by mouth twice a day till 02/16/22 then 1 tab of 40 mg by mouth daily. 01/31/22 02/16/22  Medina-Vargas, Monina C, NP  ?pantoprazole (PROTONIX) 40 MG tabl

## 2022-03-01 NOTE — Progress Notes (Addendum)
Heart Failure Navigator Progress Note ? ?Assessed for Heart & Vascular TOC clinic readiness.  ?  ? ? ? ?Earnestine Leys, BSN, RN ?Heart Failure Nurse Navigator ?Secure Chat Only   ?

## 2022-03-01 NOTE — ED Provider Notes (Signed)
?[facility] ?Provider Note ? ? ?CSN: [id number] ?Arrival date & time:    ? ?  ? ?History ? ?Chief Complaint  ?Patient presents with  ? Failure To Thrive  ? ? ?[name] is a 88 y.o. male. ? ?Patient presents to ER chief complaint of generalized weakness, intermittent confusion.  Symptoms worsening over the course of the last week.  Patient was admitted in February for similar weakness and GI bleed and discharged to a rehab facility and was discharged home about 2 weeks ago.  He otherwise lives alone with his daughter checking up on him 3 times a day.  He gets home health care.  However the daughter states that has not been ambulating now for the last several days he is in bed most of the time and is totally dependent on others to help him.  No reports of fevers or cough no reports of vomiting.  No reports of any bloody stools.  Denies any chest pain or abdominal pain.  He has been having persistent lower extremity swelling which the home nurse care has been treating with compression stockings. ? ? ?  ? ?Home Medications ?Prior to Admission medications   ?Medication Sig Start Date End Date Taking? Authorizing Provider  ?acetaminophen (TYLENOL) 650 MG CR tablet Take 650 mg by mouth daily with breakfast.    [provider]  ?Amino Acids-Protein Hydrolys (PRO-STAT) LIQD Take 30 mLs by mouth 2 (two) times daily.    [provider]  ?aspirin EC 81 MG tablet Take 1 tablet (81 mg total) by mouth daily. Swallow whole. 12/18/21 03/18/22  Kayleen Memos, DO  ?B Complex Vitamins (B COMPLEX 50) TABS Take 1 tablet by mouth daily with breakfast.    [provider]  ?bisacodyl (DULCOLAX) 10 MG suppository If not relieved by MOM, give 10 mg Bisacodyl suppositiory rectally X 1 dose in 24 hours as needed    [provider]  ?cetirizine (ZYRTEC) 10 MG tablet Take 1 tablet (10 mg total) by mouth daily. 01/31/22   Medina-Vargas, Monina C, NP  ?cholecalciferol  (VITAMIN D3) 25 MCG (1000 UNIT) tablet Take 1,000 Units by mouth 2 (two) times daily with a meal.    [provider]  ?enzalutamide (XTANDI) 80 MG tablet Take 2 tablets (160 mg total) by mouth daily. 2 pm for prostate cancer 01/31/22   Medina-Vargas, Monina C, NP  ?ferrous sulfate 325 (65 FE) MG tablet Take 1 tablet (325 mg total) by mouth 2 (two) times daily with a meal. 01/31/22 05/01/22  Medina-Vargas, Monina C, NP  ?fluticasone (FLONASE SENSIMIST) 27.5 MCG/SPRAY nasal spray Place 2 sprays into the nose daily. 01/28/22 02/03/22  [provider]  ?fluticasone (FLONASE) 50 MCG/ACT nasal spray Place 1 spray into both nostrils daily. 02/02/22   Medina-Vargas, Monina C, NP  ?gabapentin (NEURONTIN) 100 MG capsule Take 1 capsule (100 mg total) by mouth at bedtime. 01/31/22   Medina-Vargas, Senaida Lange, NP  ?lisinopril (ZESTRIL) 5 MG tablet Take 1 tablet (5 mg total) by mouth daily. 01/31/22   Medina-Vargas, Senaida Lange, NP  ?Magnesium Hydroxide (MILK OF MAGNESIA PO) Take by mouth. If no BM in 3 days, give 30 cc Milk of Magnesium p.o. x 1 dose in 24 hours as needed (Do not use standing constipation orders for residents with renal failure CFR less than 30. Contact MD for orders)    [provider]  ?metFORMIN (GLUCOPHAGE) 1000 MG tablet Take 1 tablet (1,000 mg total) by  mouth 2 (two) times daily with breakfast and lunch. 01/31/22   Medina-Vargas, Monina C, NP  ?metoprolol succinate (TOPROL-XL) 25 MG 24 hr tablet Take 0.5 tablets (12.5 mg total) by mouth daily. 01/31/22 03/02/22  Medina-Vargas, Senaida Lange, NP  ?Nutritional Supplements (NUTRITIONAL SUPPLEMENT PO) Take by mouth. MAGIC CUP TWICE DAILY FOR LOW ALBUMIN/PROTEIN    [provider]  ?Nystatin (GERHARDT'S BUTT CREAM) CREA Apply 1 application topically daily as needed for irritation. 12/18/21   Kayleen Memos, DO  ?OXYGEN 2L/min via [place] continuously    [provider]  ?pantoprazole (PROTONIX) 40 MG tablet Take 1 tablet (40 mg total) by mouth 2  (two) times daily for 16 days. Take  1 tab of 40 mg tab  by mouth twice a day till 02/16/22 then 1 tab of 40 mg by mouth daily. 01/31/22 02/16/22  Medina-Vargas, Monina C, NP  ?pantoprazole (PROTONIX) 40 MG tablet Take 1 tablet (40 mg total) by mouth daily. Take  1 tab of 40 mg tab  by mouth twice a day till 02/16/22 then 1 tab of 40 mg by mouth daily starting 02/17/22 02/17/22   Medina-Vargas, Monina C, NP  ?potassium chloride SA (KLOR-CON M20) 20 MEQ tablet Take 2 tablets (40 mEq total) by mouth daily. 02/21/22   Rudean Haskell A, MD  ?PSYLLIUM PO Take 1 capsule by mouth daily.    [provider]  ?rosuvastatin (CRESTOR) 5 MG tablet TAKE 1 TABLET BY MOUTH ONCE DAILY AT  [facility] 01/31/22   Medina-Vargas, Monina C, NP  ?Sodium Phosphates (RA SALINE ENEMA RE) Place rectally. If not relieved by Biscodyl suppository, give disposable Saline Enema rectally X 1 dose/24 hrs as needed    [provider]  ?torsemide (DEMADEX) 20 MG tablet Take 2 tablets (40 mg total) by mouth 2 (two) times daily. 02/20/22   [name], MD  ?Vitamin A 2400 MCG (8000 UT) TABS Take 2,400 mcg by mouth daily with breakfast.    [provider]  ?vitamin B-12 (CYANOCOBALAMIN) 1000 MCG tablet Take 1 tablet (1,000 mcg total) by mouth 2 (two) times daily with a meal. 01/31/22   Medina-Vargas, Monina C, NP  ?vitamin C (ASCORBIC ACID) 500 MG tablet Take 500 mg by mouth 2 (two) times daily with a meal.    [provider]  ?   ? ?Allergies    ?Amoxicillin   ? ?Review of Systems   ?Review of Systems  ?Constitutional:  Negative for fever.  ?HENT:  Negative for ear pain and sore throat.   ?Eyes:  Negative for pain.  ?Respiratory:  Negative for cough.   ?Cardiovascular:  Negative for chest pain.  ?Gastrointestinal:  Negative for abdominal pain.  ?Genitourinary:  Negative for flank pain.  ?Musculoskeletal:  Negative for back pain.  ?Skin:  Negative for color change and rash.  ?Neurological:  Negative for syncope.  ?All  other systems reviewed and are negative. ? ?Physical Exam ?Updated Vital Signs ?BP (!) 100/58   Pulse (!) 107   Temp 98.3 ?F (36.8 ?C) (Oral)   Resp 19   Ht '5\' 8"'$  (1.727 m)   Wt 72.6 kg   SpO2 93%   BMI 24.33 kg/m?  ?Physical Exam ?Constitutional:   ?   Appearance: He is well-developed.  ?HENT:  ?   Head: Normocephalic.  ?   Nose: Nose normal.  ?Eyes:  ?   Extraocular Movements: Extraocular movements intact.  ?Cardiovascular:  ?   Rate and Rhythm: Normal rate.  ?Pulmonary:  ?  Effort: Pulmonary effort is normal.  ?Skin: ?   Coloration: Skin is not jaundiced.  ?Neurological:  ?   General: No focal deficit present.  ?   Mental Status: He is alert and oriented to person, place, and time. Mental status is at baseline.  ?   Motor: Weakness present.  ?   Comments: Generalized weakness.  Unable to push himself up from bed without significant assistance.  ? ? ?ED Results / Procedures / Treatments   ?Labs ?(all labs ordered are listed, but only abnormal results are displayed) ?Labs Reviewed  ?CBC WITH DIFFERENTIAL/PLATELET - Abnormal; Notable for the following components:  ?    Result Value  ? RBC 3.18 (*)   ? Hemoglobin 10.6 (*)   ? HCT 33.5 (*)   ? MCV 105.3 (*)   ? RDW 16.5 (*)   ? Lymphs Abs 0.6 (*)   ? All other components within normal limits  ?COMPREHENSIVE METABOLIC PANEL - Abnormal; Notable for the following components:  ? Glucose, Bld 219 (*)   ? BUN 34 (*)   ? Creatinine, Ser 1.46 (*)   ? Total Protein 6.0 (*)   ? Albumin 3.1 (*)   ? Total Bilirubin 1.4 (*)   ? GFR, Estimated 46 (*)   ? All other components within normal limits  ?BRAIN NATRIURETIC PEPTIDE - Abnormal; Notable for the following components:  ? B Natriuretic Peptide 2,092.5 (*)   ? All other components within normal limits  ?TROPONIN I (HIGH SENSITIVITY) - Abnormal; Notable for the following components:  ? Troponin I (High Sensitivity) 26 (*)   ? All other components within normal limits  ?URINE CULTURE  ?URINALYSIS, ROUTINE W REFLEX  MICROSCOPIC  ?TROPONIN I (HIGH SENSITIVITY)  ? ? ?EKG ?EKG Interpretation ? ?Date/Time:  Friday March 01 2022 07:51:59 EDT ?Ventricular Rate:  114 ?PR Interval:    ?QRS Duration: 112 ?QT Interval:  361 ?QTC Calcul

## 2022-03-01 NOTE — ED Triage Notes (Signed)
Pt arrived to ED via EMS from home w/ c/o failure to thrive. Pt's daughter called EMS due to pt declining for about a month. Pt was discharged home from rehab on March 31st. He was in there for GI bleed and anemia per report. Pt also just finished abx 2 days ago for a UTI. Pt w/ BLE wounds that are wrapped. A&Ox4, but talks out of his head per EMS. Pt w/ generalized pain and tender to touch d/t arthritis. Pt's daughter reported pt having decreased mobility and disorientation. VS: HR 110-120 A-fib, 128/70, 92% RA 98% on 3L, CBG 240, Capnography 40. ?

## 2022-03-02 ENCOUNTER — Inpatient Hospital Stay (HOSPITAL_COMMUNITY): Payer: Medicare Other

## 2022-03-02 DIAGNOSIS — R0609 Other forms of dyspnea: Secondary | ICD-10-CM | POA: Diagnosis not present

## 2022-03-02 DIAGNOSIS — I509 Heart failure, unspecified: Secondary | ICD-10-CM

## 2022-03-02 DIAGNOSIS — I5033 Acute on chronic diastolic (congestive) heart failure: Secondary | ICD-10-CM | POA: Diagnosis not present

## 2022-03-02 LAB — CBC
HCT: 34.5 % — ABNORMAL LOW (ref 39.0–52.0)
Hemoglobin: 10.8 g/dL — ABNORMAL LOW (ref 13.0–17.0)
MCH: 33 pg (ref 26.0–34.0)
MCHC: 31.3 g/dL (ref 30.0–36.0)
MCV: 105.5 fL — ABNORMAL HIGH (ref 80.0–100.0)
Platelets: 199 10*3/uL (ref 150–400)
RBC: 3.27 MIL/uL — ABNORMAL LOW (ref 4.22–5.81)
RDW: 16.7 % — ABNORMAL HIGH (ref 11.5–15.5)
WBC: 6.6 10*3/uL (ref 4.0–10.5)
nRBC: 0 % (ref 0.0–0.2)

## 2022-03-02 LAB — PREALBUMIN: Prealbumin: 9.6 mg/dL — ABNORMAL LOW (ref 18–38)

## 2022-03-02 LAB — BASIC METABOLIC PANEL
Anion gap: 11 (ref 5–15)
BUN: 37 mg/dL — ABNORMAL HIGH (ref 8–23)
CO2: 31 mmol/L (ref 22–32)
Calcium: 8.9 mg/dL (ref 8.9–10.3)
Chloride: 99 mmol/L (ref 98–111)
Creatinine, Ser: 1.37 mg/dL — ABNORMAL HIGH (ref 0.61–1.24)
GFR, Estimated: 50 mL/min — ABNORMAL LOW (ref >60)
Glucose, Bld: 305 mg/dL — ABNORMAL HIGH (ref 70–99)
Potassium: 3.8 mmol/L (ref 3.5–5.1)
Sodium: 141 mmol/L (ref 135–145)

## 2022-03-02 LAB — URINE CULTURE: Culture: <10000 — AB

## 2022-03-02 LAB — ECHOCARDIOGRAM COMPLETE
AR max vel: 1.64 cm2
AV Area VTI: 1.58 cm2
AV Area mean vel: 1.48 cm2
AV Mean grad: 6 mmHg
AV Peak grad: 13 mmHg
Ao pk vel: 1.8 m/s
Height: 68 in
MV M vel: 4.67 m/s
MV Peak grad: 87.2 mmHg
Radius: 1 cm
S' Lateral: 3.15 cm
Weight: 2215.18 oz

## 2022-03-02 LAB — GLUCOSE, CAPILLARY
Glucose-Capillary: 311 mg/dL — ABNORMAL HIGH (ref 70–99)
Glucose-Capillary: 285 mg/dL — ABNORMAL HIGH (ref 70–99)
Glucose-Capillary: 242 mg/dL — ABNORMAL HIGH (ref 70–99)
Glucose-Capillary: 110 mg/dL — ABNORMAL HIGH (ref 70–99)

## 2022-03-02 MED ORDER — POTASSIUM CHLORIDE CRYS ER 20 MEQ PO TBCR
40.0000 meq | EXTENDED_RELEASE_TABLET | Freq: Once | ORAL | Status: AC
  Administered 2022-03-02: 40 meq via ORAL
  Filled 2022-03-02: qty 2

## 2022-03-02 MED ORDER — DAPAGLIFLOZIN PROPANEDIOL 10 MG PO TABS
10.0000 mg | ORAL_TABLET | Freq: Every day | ORAL | Status: DC
  Administered 2022-03-02 – 2022-03-07 (×6): 10 mg via ORAL
  Filled 2022-03-02 (×6): qty 1

## 2022-03-02 MED ORDER — FUROSEMIDE 10 MG/ML IJ SOLN
20.0000 mg | Freq: Two times a day (BID) | INTRAMUSCULAR | Status: AC
  Administered 2022-03-02 – 2022-03-03 (×2): 20 mg via INTRAVENOUS
  Filled 2022-03-02 (×2): qty 2

## 2022-03-02 MED ORDER — INSULIN DETEMIR 100 UNIT/ML ~~LOC~~ SOLN
15.0000 [IU] | Freq: Every day | SUBCUTANEOUS | Status: DC
  Administered 2022-03-02 – 2022-03-03 (×2): 15 [IU] via SUBCUTANEOUS
  Filled 2022-03-02 (×2): qty 0.15

## 2022-03-02 NOTE — Consult Note (Signed)
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 The patient's bedside RN, Hale Bogus assisted me in the conduct of the assessment. ?Wound type: Pressure, venous insufficiency ?Pressure Injury POA: Yes ?Measurement: ?Coccyx: 3.6cm x 0.3cm x 0.1cm ?RLE: 6cm x 1.8cm x 0.1cm ?Wound bed: Pink, moist ?Drainage (amount, consistency, odor) small serous (coccyx), moderate serous (LE) ?Periwound: intact ?Dressing procedure/placement/frequency: ?I have provided Nursing with guidance via the orders for turning and repositioning and have provided a pressure redistribution chair cushion for use when OOB in the chair both while in house and post discharge.  Topical care to the coccyx pressure injury will be with a small piece of xeroform gauze topped with a dry gauze 2x2 and covered with a silicone foam for the sacrum. Topical care to the right LE will be with a size appropriate piece of silver hydrofiber topped with dry gauze and secured by wrapping from toe to knee with Kerlix roll gauze/paper tape. This will be topped with an ACE bandage for compression applied in a similar manner.  ? ?[facility] nursing team will not follow, but will remain available to this patient, the nursing and medical teams.  Please re-consult if needed. ?Thanks, ?Maudie Flakes, MSN, RN, [place], [place], [facility]ON-AP, [place]  ?Pager# [phone]  ? ? ? ?  ?

## 2022-03-02 NOTE — Evaluation (Signed)
Physical Therapy Evaluation ?Patient Details ?Name: [name] ?MRN: [id number] ?DOB: 05-02-1933 ?Today's Date: 03/02/2022 ? ?History of Present Illness ? 88 y.o. M adm 4/28 with sepsis, CHF exacerbation and encephalopathy. PMhx: HFpEF, AS s/p TAVR, CAD, moderate mitral regurgitation, type 2 diabetes, HTN, persistent Afib, PAD, CKD 3b, GIB, prostate CA.  ?Clinical Impression ? Pt pleasant and demonstrates decreased strength, balance, cognition, and function with pt a high fall risk. Pt lives at home alone with help intermittently and wife in SNF. Spoke with daughter who reports pt has stopped walking at home and only uses [facility] for risk/fear of falling. Pt would benefit from ST-SNF for maximize function and gait with significant benefit of ALF long term and discussed with daughter. Pt will benefit from acute therapy to maximize mobility, safety and independence to decrease fall and burden of care.  ? ?HR 91 ?SpO2 91% on RA   ?   ? ?Recommendations for follow up therapy are one component of a multi-disciplinary discharge planning process, led by the attending physician.  Recommendations may be updated based on patient status, additional functional criteria and insurance authorization. ? ?Follow Up Recommendations Skilled nursing-short term rehab (<3 hours/day) ? ?  ?Assistance Recommended at Discharge Frequent or constant Supervision/Assistance  ?Patient can return home with the following ? A little help with walking and/or transfers;A little help with bathing/dressing/bathroom;Assistance with cooking/housework;Direct supervision/assist for financial management;Assist for transportation;Direct supervision/assist for medications management ? ?  ?Equipment Recommendations None recommended by PT  ?Recommendations for Other Services ?    ?  ?Functional Status Assessment Patient has had a recent decline in their functional status and demonstrates the ability to make significant improvements in function in a reasonable and  predictable amount of time.  ? ?  ?Precautions / Restrictions Precautions ?Precautions: Fall ?Restrictions ?Weight Bearing Restrictions: No  ? ?  ? ?Mobility ? Bed Mobility ?Overal bed mobility: Needs Assistance ?Bed Mobility: Supine to Sit ?  ?  ?Supine to sit: Mod assist, HOB elevated ?  ?  ?General bed mobility comments: HOB 35 degrees, mod assist to pivot to EOB with use of pad, multimodal cues and increased time ?  ? ?Transfers ?Overall transfer level: Needs assistance ?  ?Transfers: Sit to/from Stand ?Sit to Stand: Min assist, Mod assist ?  ?  ?  ?  ?  ?General transfer comment: min assist from bed with cues for hand placement, mod assist from low toilet ?  ? ?Ambulation/Gait ?Ambulation/Gait assistance: Min assist ?Gait Distance (Feet): 70 Feet ?Assistive device: Rolling walker (2 wheels) ?Gait Pattern/deviations: Step-through pattern, Decreased stride length, Trunk flexed ?  ?Gait velocity interpretation: <1.8 ft/sec, indicate of risk for recurrent falls ?  ?General Gait Details: pt with flexed trunk, maintained hip and knee flexion with guarding and cues for direction. Pt walked 15' to bathroom then additional 55' with assist and limited by fatigue ? ?Stairs ?  ?  ?  ?  ?  ? ?Wheelchair Mobility ?  ? ?Modified Rankin (Stroke Patients Only) ?  ? ?  ? ?Balance Overall balance assessment: Needs assistance ?Sitting-balance support: No upper extremity supported, Feet supported ?Sitting balance-Leahy Scale: Fair ?Sitting balance - Comments: static sitting without assist ?  ?Standing balance support: Bilateral upper extremity supported, Reliant on assistive device for balance ?Standing balance-Leahy Scale: Poor ?  ?  ?  ?  ?  ?  ?  ?  ?  ?  ?  ?  ?   ? ? ? ?Pertinent Vitals/Pain Pain  Assessment ?Pain Assessment: 0-10 ?Pain Score: 4  ?Pain Location: right knee ?Pain Descriptors / Indicators: Aching, Sore ?Pain Intervention(s): Limited activity within patient's tolerance, Monitored during session, Repositioned   ? ? ?Home Living Family/patient expects to be discharged to:: Private residence ?Living Arrangements: Alone ?Available Help at Discharge: Family;Available PRN/intermittently ?Type of Home: House ?Home Access: Level entry ?  ?  ?  ?Home Layout: One level ?Home Equipment: Conservation officer, nature (2 wheels);Cane - single point;BSC/3in1;Shower seat;Wheelchair - manual ?Additional Comments: wife at [place], [place] has only been mobilizing with WC at home since D/C from [facility] end of March due to fear/risk of falling  ?  ?Prior Function Prior Level of Function : Needs assist ?  ?  ?  ?  ?  ?  ?Mobility Comments: sleeps in recliner ?ADLs Comments: does not drive, reports daughter assists with IADLs, has HHRN and PT ?  ? ? ?Hand Dominance  ?   ? ?  ?Extremity/Trunk Assessment  ? Upper Extremity Assessment ?Upper Extremity Assessment: Generalized weakness ?  ? ?Lower Extremity Assessment ?Lower Extremity Assessment: Generalized weakness ?  ? ?Cervical / Trunk Assessment ?Cervical / Trunk Assessment: Kyphotic  ?Communication  ? Communication: No difficulties  ?Cognition Arousal/Alertness: Awake/alert ?Behavior During Therapy: [facility] for tasks assessed/performed ?Overall Cognitive Status: Impaired/Different from baseline ?Area of Impairment: Safety/judgement ?  ?  ?  ?  ?  ?  ?  ?  ?  ?  ?  ?  ?Safety/Judgement: Decreased awareness of deficits, Decreased awareness of safety ?  ?  ?  ?  ?  ? ?  ?General Comments   ? ?  ?Exercises General Exercises - Lower Extremity ?Heel Slides: AROM, Right, Supine, 15 reps (warm up prior to mobility)  ? ?Assessment/Plan  ?  ?PT Assessment Patient needs continued PT services  ?PT Problem List Decreased strength;Decreased mobility;Decreased safety awareness;Decreased activity tolerance;Decreased cognition;Decreased balance;Decreased knowledge of use of DME ? ?   ?  ?PT Treatment Interventions Gait training;Balance training;Functional mobility training;Therapeutic activities;Patient/family  education;Cognitive remediation;DME instruction;Therapeutic exercise   ? ?PT Goals (Current goals can be found in the Care Plan section)  ?Acute Rehab PT Goals ?Patient Stated Goal: return home ?PT Goal Formulation: With patient ?Time For Goal Achievement: 03/16/22 ?Potential to Achieve Goals: Fair ? ?  ?Frequency Min 3X/week ?  ? ? ?Co-evaluation   ?  ?  ?  ?  ? ? ?  ?AM-PAC PT "6 Clicks" Mobility  ?Outcome Measure Help needed turning from your back to your side while in a flat bed without using bedrails?: A Little ?Help needed moving from lying on your back to sitting on the side of a flat bed without using bedrails?: A Little ?Help needed moving to and from a bed to a chair (including a wheelchair)?: A Little ?Help needed standing up from a chair using your arms (e.g., wheelchair or bedside chair)?: A Lot ?Help needed to walk in hospital room?: A Little ?Help needed climbing 3-5 steps with a railing? : Total ?6 Click Score: 15 ? ?  ?End of Session Equipment Utilized During Treatment: Gait belt ?Activity Tolerance: Patient tolerated treatment well ?Patient left: in chair;with call bell/phone within reach;with chair alarm set ?Nurse Communication: Mobility status ?PT Visit Diagnosis: Other abnormalities of gait and mobility (R26.89);Difficulty in walking, not elsewhere classified (R26.2);Muscle weakness (generalized) (M62.81) ?  ? ?Time: 2706-2376 ?PT Time Calculation (min) (ACUTE ONLY): 32 min ? ? ?Charges:   PT Evaluation ?$PT Eval Moderate Complexity: 1 Mod ?PT Treatments ?$Gait  Training: 8-22 mins ?  ?   ? ? ?Lonnette Shrode P, PT ?Acute Rehabilitation Services ?Pager: [phone] ?Office: [phone] ? ? ?Pernella Ackerley B Delissa Silba ?03/02/2022, 9:21 AM ? ?

## 2022-03-02 NOTE — Plan of Care (Signed)
?  Problem: Health Behavior/Discharge Planning: ?Goal: Ability to manage health-related needs will improve ?Outcome: Progressing ?  ?Problem: Education: ?Goal: Knowledge of General Education information will improve ?Description: Including pain rating scale, medication(s)/side effects and non-pharmacologic comfort measures ?Outcome: Progressing ?  ?Problem: Clinical Measurements: ?Goal: Ability to maintain clinical measurements within normal limits will improve ?Outcome: Progressing ?  ?Problem: Clinical Measurements: ?Goal: Will remain free from infection ?Outcome: Progressing ?  ?

## 2022-03-02 NOTE — Progress Notes (Addendum)
? ?Progress Note ? ?Patient Name: [name] ?Date of Encounter: 03/02/2022 ? ?[facility] Cardiologist: [name], MD  ? ?Subjective  ? ?Mildly improved SOB ? ?Inpatient Medications  ?  ?Scheduled Meds: ? aspirin EC  81 mg Oral Daily  ? enoxaparin (LOVENOX) injection  40 mg Subcutaneous Q24H  ? feeding supplement  237 mL Oral BID BM  ? feeding supplement (GLUCERNA SHAKE)  237 mL Oral TID BM  ? fluticasone  1 spray Each Nare Daily  ? furosemide  20 mg Intravenous BID  ? gabapentin  100 mg Oral QHS  ? insulin aspart  0-5 Units Subcutaneous QHS  ? insulin aspart  0-9 Units Subcutaneous TID WC  ? insulin detemir  15 Units Subcutaneous Daily  ? loratadine  10 mg Oral Daily  ? metoprolol succinate  12.5 mg Oral Daily  ? multivitamin with minerals  1 tablet Oral Daily  ? pantoprazole  40 mg Oral Daily  ? potassium chloride  40 mEq Oral Once  ? rosuvastatin  5 mg Oral QHS  ? sodium chloride flush  3 mL Intravenous Q12H  ? ?Continuous Infusions: ? sodium chloride    ? ?PRN Meds: ?sodium chloride, acetaminophen, ondansetron (ZOFRAN) IV, sodium chloride flush  ? ?Vital Signs  ?  ?Vitals:  ? 03/02/22 0455 03/02/22 0800 03/02/22 0811 03/02/22 0909  ?BP: (!) 103/49 (!) 121/55  102/64  ?Pulse: 88 89 91 92  ?Resp: 19 16    ?Temp: 98 ?F (36.7 ?C) 98.1 ?F (36.7 ?C)    ?TempSrc: Oral Oral    ?SpO2:  99% 91%   ?Weight: 62.8 kg     ?Height:      ? ? ?Intake/Output Summary (Last 24 hours) at 03/02/2022 0919 ?Last data filed at 03/02/2022 1610 ?Gross per 24 hour  ?Intake 240 ml  ?Output 2600 ml  ?Net -2360 ml  ? ? ?  03/02/2022  ?  4:55 AM 03/01/2022  ?  3:15 PM 03/01/2022  ?  1:44 PM  ?Last 3 Weights  ?Weight (lbs) 138 lb 7.2 oz 141 lb 6.4 oz 140 lb 3.4 oz  ?Weight (kg) 62.8 kg 64.139 kg 63.6 kg  ?   ? ?Telemetry  ?  ?Rate controlled afib - Personally Reviewed ? ?ECG  ?  ?N/a - Personally Reviewed ? ?Physical Exam  ? ?GEN: No acute distress.   ?Neck: + JVD ?Cardiac: irreg, 3/6 sysotlic murmur apex ?Respiratory:  crackles bialtearlly ?GI: Soft, nontender, non-distended  ?MS: No edema; No deformity. ?Neuro:  Nonfocal  ?Psych: Normal affect  ? ?Labs  ?  ?High Sensitivity Troponin:   ?Recent Labs  ?Lab 03/01/22 ?9604 03/01/22 ?1300  ?TROPONINIHS 26* 31*  ?   ?Chemistry ?Recent Labs  ?Lab 03/01/22 ?5409 03/02/22 ?0336  ?NA 142 141  ?K 4.0 3.8  ?CL 99 99  ?CO2 30 31  ?GLUCOSE 219* 305*  ?BUN 34* 37*  ?CREATININE 1.46* 1.37*  ?CALCIUM 9.0 8.9  ?PROT 6.0*  --   ?ALBUMIN 3.1*  --   ?AST 26  --   ?ALT 17  --   ?ALKPHOS 76  --   ?BILITOT 1.4*  --   ?GFRNONAA 46* 50*  ?ANIONGAP 13 11  ?  ?Lipids No results for input(s): CHOL, TRIG, HDL, LABVLDL, LDLCALC, CHOLHDL in the last 168 hours.  ?Hematology ?Recent Labs  ?Lab 03/01/22 ?8119 03/02/22 ?0336  ?WBC 8.7 6.6  ?RBC 3.18* 3.27*  ?HGB 10.6* 10.8*  ?HCT 33.5* 34.5*  ?MCV 105.3* 105.5*  ?  MCH 33.3 33.0  ?MCHC 31.6 31.3  ?RDW 16.5* 16.7*  ?PLT 186 199  ? ?Thyroid  ?Recent Labs  ?Lab 03/01/22 ?1413  ?TSH 1.200  ?  ?BNP ?Recent Labs  ?Lab 03/01/22 ?0847  ?BNP 2,092.5*  ?  ?DDimer No results for input(s): DDIMER in the last 168 hours.  ? ?Radiology  ?  ?MR BRAIN WO CONTRAST ? ?Result Date: 03/01/2022 ?CLINICAL DATA:  Mental status change, failure to thrive EXAM: MRI HEAD WITHOUT CONTRAST TECHNIQUE: Multiplanar, multiecho pulse sequences of the brain and surrounding structures were obtained without intravenous contrast. COMPARISON:  No prior MRI, correlation is made with CT head 12/13/2021 FINDINGS: Brain: No restricted diffusion to suggest acute or subacute infarct. No acute hemorrhage, mass, mass effect, or midline shift. No hydrocephalus. Commensurate sulcal and ventricular prominence. Redemonstrated prominent extra-axial space along the right cerebral convexity, possibly a thin hygroma, unchanged from the prior CT. No new extra-axial collection. No hemosiderin deposition to suggest remote hemorrhage or superficial siderosis. T2 hyperintense signal in the periventricular white matter, likely  the sequela of chronic small vessel ischemic disease. Vascular: Normal flow voids. Skull and upper cervical spine: Normal marrow signal. Sinuses/Orbits: Clear paranasal sinuses. Status post bilateral lens replacements. Other: Fluid in the right-greater-than-left mastoid air cells. IMPRESSION: No acute intracranial process. No etiology seen for the patient's altered mental status. Electronically Signed   By: Merilyn Baba M.D.   On: 03/01/2022 22:26  ? ?DG Chest Port 1 View ? ?Result Date: 03/01/2022 ?CLINICAL DATA:  88 year old male with weakness. Discharged from rehab last month. EXAM: PORTABLE CHEST 1 VIEW COMPARISON:  Portable chest 12/13/2021 and earlier. FINDINGS: Portable AP semi upright view at 0824 hours. Lower lung volumes and rotated to the right. Stable cardiomegaly and mediastinal contours. New veiling opacity at the right lung base. No pulmonary edema, pneumothorax or consolidation identified. Visualized tracheal air column is within normal limits. No acute osseous abnormality identified. Negative visible bowel gas. IMPRESSION: Lower lung volumes with probable small new right pleural effusion. Stable cardiomegaly. Electronically Signed   By: Genevie Ann M.D.   On: 03/01/2022 08:30   ? ?Cardiac Studies  ? ? ? ?Patient Profile  ?   ?[name] is a 88 y.o. male with a hx of aortic stenosis who is being seen 03/01/2022 for the evaluation of CHF at the request of Dr Tamala Julian. ? ?Assessment & Plan  ?  ?1.AMS ?- per primary team ? ?2. Acute on chronic HFpEF ?- Jan 2023 echo: LVEF 60-65%, mild D shaped septum, mild RV dysfunciton, PASP 70, severe BAE, probable severe MR, normal TAVR valve ?- BNP 2092, CXR small right effusion ?- received total of '100mg'$  of IV lasix yesterday. Incomplet I/Os but 2.6 L of uop documented. Downtrend in Cr with diuresis consistent with venous congestion and CHF. Due for IV lasix '20mg'$  bid, follow diuresis.  ?- obtain limted echo ?- would restart his farxiga ? ?3. Mitral  regurgitation ?- probably severe by Jan 2023 echo ?- not a candidate for valve intervention given advanced age and fraility ? ?2. Persistent afib ?- not on anticoag due to prior falls, recurrent GI bleeding.  ?- continue rate control ? ?3. History of aortic stenosis s/p transapical TAVR 2014 ? ?4. CKD 3 ?- home ACEi on hold given AKI ? ? ?For questions or updates, please contact [facility] ?Please consult [email or web address] for contact info under  ? ?  ?   ?Signed, ?Carlyle Dolly, MD  ?03/02/2022, 9:19 AM   ? ?

## 2022-03-02 NOTE — Progress Notes (Incomplete)
?  Echocardiogram ?2D Echocardiogram has been performed. ? ?[name] ?03/02/2022, 1:06 PM ?

## 2022-03-02 NOTE — Progress Notes (Signed)
?PROGRESS NOTE ? ? ? ?[name]  IPJ:[id number] DOB: Jan 31, 1933 DOA: 03/01/2022 ?PCP: [name], MD  ?88 y.o. male with history of CAD, AS s/p TAVR, chronic diastolic CHF, severe mitral regurgitation, severe PAH, paroxysmal atrial fibrillation, no longer on anticoagulation due to recurrent GI bleeds, chronic anemia was brought to the ED on account of weakness, confusion and failure to thrive. ?-Also recently had increased swelling, torsemide dose was increased 10 days ago, also having intermittent hallucinations, just completed course of antibiotics for UTI 2 days prior to admission ?-In the emergency room he was afebrile, labs noted creatinine of 1.46, BNP 2092, chest x-ray noted small right pleural effusion and cardiomegaly ? ?Subjective: ?-Feels okay, denies any complaints, denies any fevers chills nausea vomiting, reports some shortness of breath with activity ? ?Assessment and Plan: ? ?Acute on chronic diastolic CHF  ?Right heart failure, severe MR, severe TR  ?Severe PAH ?History of TAVR ?--2.6 L yesterday, continue IV Lasix 20 Mg twice daily 1 more day ?-Monitor I's/O, daily weights ?-Cardiology following, due to advanced age and frailty he would not be considered a candidate for transcatheter mitral valve repair ?-Continue metoprolol, may need DCCV for A-fib ?-Resume Farxiga tomorrow if BP, kidney function stays stable ?-Overall prognosis appears somewhat poor, may need palliative care evaluation, will await cardiology input ?-PT OT eval ? ?Mild lactic acidosis ?-Likely secondary to right heart failure, clinically do not suspect acute infectious process at this time ?-Lactic acidosis has resolved, he is afebrile without leukocytosis, urinalysis and chest x-ray unremarkable ?-Discontinue antibiotics and monitor ?  ?Acute metabolic encephalopathy ?-Recent history of hallucinations, he is awake and alert, oriented x3 this morning with mild cognitive deficits only, MRI brain yesterday was unremarkable,  infectious work-up also unrevealing ?-TSH is normal ?  ?Acute kidney injury ?-Mild AKI, likely cardiorenal, monitor diuretics ?  ?Chronic atrial fibrillation ?-Heart rate improving, continue metoprolol , no longer on anticoagulation due to recurrent GI bleed  ?  ?Essential hypertension ?-BP soft now, continue metoprolol ? ?Anemia of chronic disease ?-Hemoglobin stable ?  ?Diabetes mellitus type 2, well controlled ?- Last hemoglobin A1c was 5.4 on 12/15/2021. ?-CBGs elevated, will start Levemir and monitor ?  ?Prostate cancer ?-Continue Casodex ?  ?Moderate protein calorie malnutrition ?Albumin 3.1 ? ?Chronic leg wounds ?-Routine wound care ?  ?Advance Care Planning:   Code Status: Full Code  ?  ?Consults: Cardiology ?  ?Family Communication: No family at bedside, will update daughter ?  ? ?Procedures:  ? ?Antimicrobials:  ? ? ?Objective: ?Vitals:  ? 03/02/22 0455 03/02/22 0800 03/02/22 0811 03/02/22 0909  ?BP: (!) 103/49 (!) 121/55  102/64  ?Pulse: 88 89 91 92  ?Resp: 19 16    ?Temp: 98 ?F (36.7 ?C) 98.1 ?F (36.7 ?C)    ?TempSrc: Oral Oral    ?SpO2:  99% 91%   ?Weight: 62.8 kg     ?Height:      ? ? ?Intake/Output Summary (Last 24 hours) at 03/02/2022 7341 ?Last data filed at 03/02/2022 9379 ?Gross per 24 hour  ?Intake 240 ml  ?Output 2600 ml  ?Net -2360 ml  ? ?Filed Weights  ? 03/01/22 1344 03/01/22 1515 03/02/22 0455  ?Weight: 63.6 kg 64.1 kg 62.8 kg  ? ? ?Examination: ? ?General exam: Pleasant elderly male sitting up in the recliner, awake alert oriented x3, no distress, minimal cognitive deficits ?CVS: S1-S2, regular rhythm, systolic murmur ?Lungs: Decreased breath sounds at the bases otherwise clear ?Abdomen: Firm, mildly distended, bowel sounds  present ?Extremities: Trace edema, right leg with dressing ?Psychiatry:  Mood & affect appropriate.  ? ? ? ?Data Reviewed:  ? ?CBC: ?Recent Labs  ?Lab 03/01/22 ?1245 03/02/22 ?0336  ?WBC 8.7 6.6  ?NEUTROABS 7.2  --   ?HGB 10.6* 10.8*  ?HCT 33.5* 34.5*  ?MCV 105.3* 105.5*   ?PLT 186 199  ? ?Basic Metabolic Panel: ?Recent Labs  ?Lab 03/01/22 ?8099 03/02/22 ?0336  ?NA 142 141  ?K 4.0 3.8  ?CL 99 99  ?CO2 30 31  ?GLUCOSE 219* 305*  ?BUN 34* 37*  ?CREATININE 1.46* 1.37*  ?CALCIUM 9.0 8.9  ? ?GFR: ?Estimated Creatinine Clearance: 33.7 mL/min (A) (by C-G formula based on SCr of 1.37 mg/dL (H)). ?Liver Function Tests: ?Recent Labs  ?Lab 03/01/22 ?0847  ?AST 26  ?ALT 17  ?ALKPHOS 76  ?BILITOT 1.4*  ?PROT 6.0*  ?ALBUMIN 3.1*  ? ?No results for input(s): LIPASE, AMYLASE in the last 168 hours. ?No results for input(s): AMMONIA in the last 168 hours. ?Coagulation Profile: ?No results for input(s): INR, PROTIME in the last 168 hours. ?Cardiac Enzymes: ?No results for input(s): CKTOTAL, CKMB, CKMBINDEX, TROPONINI in the last 168 hours. ?BNP (last 3 results) ?Recent Labs  ?  12/05/21 ?0915  ?PROBNP 6,343*  ? ?HbA1C: ?No results for input(s): HGBA1C in the last 72 hours. ?CBG: ?Recent Labs  ?Lab 03/01/22 ?2106 03/02/22 ?0606  ?GLUCAP 305* 311*  ? ?Lipid Profile: ?No results for input(s): CHOL, HDL, LDLCALC, TRIG, CHOLHDL, LDLDIRECT in the last 72 hours. ?Thyroid Function Tests: ?Recent Labs  ?  03/01/22 ?1413  ?TSH 1.200  ? ?Anemia Panel: ?No results for input(s): VITAMINB12, FOLATE, FERRITIN, TIBC, IRON, RETICCTPCT in the last 72 hours. ?Urine analysis: ?   ?Component Value Date/Time  ? COLORURINE YELLOW 03/01/2022 1300  ? APPEARANCEUR CLEAR 03/01/2022 1300  ? LABSPEC 1.012 03/01/2022 1300  ? LABSPEC 1.010 02/05/2006 1006  ? PHURINE 6.0 03/01/2022 1300  ? GLUCOSEU >=500 (A) 03/01/2022 1300  ? HGBUR NEGATIVE 03/01/2022 1300  ? [place] NEGATIVE 03/01/2022 1300  ? BILIRUBINUR Negative 02/05/2006 1006  ? [place] NEGATIVE 03/01/2022 1300  ? PROTEINUR NEGATIVE 03/01/2022 1300  ? UROBILINOGEN 2.0 (H) 01/15/2013 1411  ? NITRITE NEGATIVE 03/01/2022 1300  ? LEUKOCYTESUR NEGATIVE 03/01/2022 1300  ? LEUKOCYTESUR Negative 02/05/2006 1006  ? ?Sepsis  Labs: ?'@LABRCNTIP'$ (procalcitonin:4,lacticidven:4) ? ?) ?Recent Results (from the past 240 hour(s))  ?Culture, blood (Routine X 2) w Reflex to ID Panel     Status: None (Preliminary result)  ? Collection Time: 03/01/22  6:42 PM  ? Specimen: BLOOD  ?Result Value Ref Range Status  ? Specimen Description BLOOD RIGHT ANTECUBITAL  Final  ? Special Requests   Final  ?  BOTTLES DRAWN AEROBIC AND ANAEROBIC Blood Culture adequate volume  ? Culture   Final  ?  NO GROWTH < 12 HOURS ?Performed at [facility], [place] [address] [place], [place] [address] ?  ? Report Status PENDING  Incomplete  ?Culture, blood (Routine X 2) w Reflex to ID Panel     Status: None (Preliminary result)  ? Collection Time: 03/01/22  6:42 PM  ? Specimen: BLOOD  ?Result Value Ref Range Status  ? Specimen Description BLOOD LEFT ANTECUBITAL  Final  ? Special Requests   Final  ?  BOTTLES DRAWN AEROBIC AND ANAEROBIC Blood Culture adequate volume  ? Culture   Final  ?  NO GROWTH < 12 HOURS ?Performed at [facility], [place] [address] [place], [place] [address] ?  ?  Report Status PENDING  Incomplete  ?  ? ?Radiology Studies: ?MR BRAIN WO CONTRAST ? ?Result Date: 03/01/2022 ?CLINICAL DATA:  Mental status change, failure to thrive EXAM: MRI HEAD WITHOUT CONTRAST TECHNIQUE: Multiplanar, multiecho pulse sequences of the brain and surrounding structures were obtained without intravenous contrast. COMPARISON:  No prior MRI, correlation is made with CT head 12/13/2021 FINDINGS: Brain: No restricted diffusion to suggest acute or subacute infarct. No acute hemorrhage, mass, mass effect, or midline shift. No hydrocephalus. Commensurate sulcal and ventricular prominence. Redemonstrated prominent extra-axial space along the right cerebral convexity, possibly a thin hygroma, unchanged from the prior CT. No new extra-axial collection. No hemosiderin deposition to suggest remote hemorrhage or superficial siderosis. T2 hyperintense signal in the periventricular white  matter, likely the sequela of chronic small vessel ischemic disease. Vascular: Normal flow voids. Skull and upper cervical spine: Normal marrow signal. Sinuses/Orbits: Clear paranasal sinuses. Status post bilateral lens replacements. Other: Fluid in the right-

## 2022-03-02 NOTE — Evaluation (Signed)
Occupational Therapy Evaluation ?Patient Details ?Name: [name] ?MRN: [id number] ?DOB: [birth year] ?Today's Date: 03/02/2022 ? ? ?History of Present Illness 88 y.o. M adm 4/28 with sepsis, CHF exacerbation and encephalopathy. PMhx: HFpEF, AS s/p TAVR, CAD, moderate mitral regurgitation, type 2 diabetes, HTN, persistent Afib, PAD, CKD 3b, GIB, prostate CA.  ? ?Clinical Impression ?  ?Pt PTA: Pt requiring assist for iADL and reports independence with ADL tasks. Pt currently, requiring increased assistance due to fatigue, poor activity tolerance, decreased strength and decreased activity tolerance. Pt ambulating in room with RW to sink with minA to modA overall for transfers, standing x3 mins with leaning on elbows intermittently.  VSS. 122/65 BP; 102 BPM, 98% O2 on RA. Pt would benefit from continued OT skilled services. OT following acutely. ?   ? ?Recommendations for follow up therapy are one component of a multi-disciplinary discharge planning process, led by the attending physician.  Recommendations may be updated based on patient status, additional functional criteria and insurance authorization.  ? ?Follow Up Recommendations ? Skilled nursing-short term rehab (<3 hours/day)  ?  ?Assistance Recommended at Discharge Frequent or constant Supervision/Assistance  ?Patient can return home with the following A little help with walking and/or transfers;A lot of help with bathing/dressing/bathroom;Assistance with cooking/housework;Assist for transportation;Help with stairs or ramp for entrance ? ?  ?Functional Status Assessment ? Patient has had a recent decline in their functional status and demonstrates the ability to make significant improvements in function in a reasonable and predictable amount of time.  ?Equipment Recommendations ? None recommended by OT  ?  ?Recommendations for Other Services   ? ? ?  ?Precautions / Restrictions Precautions ?Precautions: Fall ?Restrictions ?Weight Bearing Restrictions: No   ? ?  ? ?Mobility Bed Mobility ?Overal bed mobility: Needs Assistance ?Bed Mobility: Supine to Sit, Sit to Supine ?  ?  ?Supine to sit: Mod assist ?Sit to supine: Mod assist ?  ?General bed mobility comments: HOB elevated; modA for swinging BLEs over with pad and proper hand placement to assist ?  ? ?Transfers ?Overall transfer level: Needs assistance ?  ?Transfers: Sit to/from Stand ?Sit to Stand: Mod assist ?  ?  ?  ?  ?  ?General transfer comment: ModA overall for stability and power up assist from bed ?  ? ?  ?Balance Overall balance assessment: Needs assistance ?Sitting-balance support: No upper extremity supported, Feet supported ?Sitting balance-Leahy Scale: Fair ?Sitting balance - Comments: static sitting without assist ?  ?Standing balance support: Bilateral upper extremity supported, Reliant on assistive device for balance ?Standing balance-Leahy Scale: Poor ?Standing balance comment: head forward leaning over posture ?  ?  ?  ?  ?  ?  ?  ?  ?  ?  ?  ?   ? ?ADL either performed or assessed with clinical judgement  ? ?ADL Overall ADL's : Needs assistance/impaired ?Eating/Feeding: Set up;Sitting ?  ?Grooming: Supervision/safety;Standing ?Grooming Details (indicate cue type and reason): stood at sink x3 mins ?Upper Body Bathing: Set up;Sitting ?  ?Lower Body Bathing: Maximal assistance;Sitting/lateral leans;Sit to/from stand;Cueing for safety ?  ?Upper Body Dressing : Set up;Sitting ?  ?Lower Body Dressing: Maximal assistance;Sitting/lateral leans;Sit to/from stand ?  ?Toilet Transfer: Min guard ?Toilet Transfer Details (indicate cue type and reason): to avoid plopping ?Toileting- Clothing Manipulation and Hygiene: Maximal assistance;Total assistance ?Toileting - Clothing Manipulation Details (indicate cue type and reason): use of male purewick ?  ?  ?Functional mobility during ADLs: Minimal assistance;Rolling walker (2 wheels);Cueing for safety ?  General ADL Comments: Pt set-upA to [place] for ADL tasks. Pt  currently, requiring increased assistance due to fatigue, poor activity tolerance, decreased strength and decreased activity tolerance.  ? ? ? ?Vision Baseline Vision/History: 0 No visual deficits ?Ability to See in Adequate Light: 0 Adequate ?Patient Visual Report: No change from baseline ?Vision Assessment?: No apparent visual deficits  ?   ?Perception   ?  ?Praxis   ?  ? ?Pertinent Vitals/Pain Pain Assessment ?Pain Assessment: 0-10 ?Pain Score: 4  ?Pain Location: right knee ?Pain Descriptors / Indicators: Aching, Sore ?Pain Intervention(s): Monitored during session  ? ? ? ?Hand Dominance Right ?  ?Extremity/Trunk Assessment Upper Extremity Assessment ?Upper Extremity Assessment: Generalized weakness ?  ?Lower Extremity Assessment ?Lower Extremity Assessment: Generalized weakness ?  ?Cervical / Trunk Assessment ?Cervical / Trunk Assessment: Kyphotic ?  ?Communication Communication ?Communication: No difficulties ?  ?Cognition Arousal/Alertness: Awake/alert ?Behavior During Therapy: [facility] for tasks assessed/performed ?Overall Cognitive Status: Impaired/Different from baseline ?Area of Impairment: Safety/judgement ?  ?  ?  ?  ?  ?  ?  ?  ?  ?  ?  ?  ?Safety/Judgement: Decreased awareness of deficits, Decreased awareness of safety ?  ?  ?  ?  ?  ?General Comments  122/65 BP; 102 BPM, 98% O2 on RA ? ?  ?Exercises Exercises: Other exercises ?Other Exercises ?Other Exercises: ROM to shoulders flex/ext; circumduction x10 reps for warm up ?  ?Shoulder Instructions    ? ? ?Home Living Family/patient expects to be discharged to:: Private residence ?Living Arrangements: Alone ?Available Help at Discharge: Family;Available PRN/intermittently ?Type of Home: House ?Home Access: Level entry ?  ?  ?Home Layout: One level ?  ?  ?Bathroom Shower/Tub: Sponge bathes at baseline;Walk-in shower ?  ?Bathroom Toilet: Standard ?  ?  ?Home Equipment: Conservation officer, nature (2 wheels);Cane - single point;BSC/3in1;Shower seat;Wheelchair - manual ?   ?Additional Comments: wife at [place], Pt has only been mobilizing with WC at home since D/C from [place] end of March due to fear/risk of falling ?  ? ?  ?Prior Functioning/Environment Prior Level of Function : Needs assist ?  ?  ?  ?  ?  ?  ?Mobility Comments: sleeps in recliner ?ADLs Comments: does not drive, reports daughter assists with IADLs, has HHRN and PT ?  ? ?  ?  ?OT Problem List: Decreased strength;Decreased activity tolerance;Impaired balance (sitting and/or standing);Decreased cognition;Decreased safety awareness;Pain;Increased edema;Decreased knowledge of use of DME or AE;Cardiopulmonary status limiting activity;Impaired UE functional use ?  ?   ?OT Treatment/Interventions: Self-care/ADL training;Therapeutic exercise;Energy conservation;DME and/or AE instruction;Therapeutic activities;Cognitive remediation/compensation;Patient/family education;Balance training  ?  ?OT Goals(Current goals can be found in the care plan section) Acute Rehab OT Goals ?Patient Stated Goal: to go to rehab ?OT Goal Formulation: With patient/family ?Time For Goal Achievement: 03/16/22 ?Potential to Achieve Goals: Good ?ADL Goals ?Pt Will Perform Grooming: with set-up;standing ?Additional ADL Goal #1: Pt will stand x5 mins for ADL tasks OOB with supervisionA with minimal resting on elbows to increase activity tolerance. ?Additional ADL Goal #2: Pt will state 3 energy conservation techniques to increase carry over of skills to increase endurance for ADL/mobility.  ?OT Frequency: Min 2X/week ?  ? ?Co-evaluation   ?  ?  ?  ?  ? ?  ?AM-PAC OT "6 Clicks" Daily Activity     ?Outcome Measure Help from another person eating meals?: A Little ?Help from another person taking care of personal grooming?: None ?Help from another person toileting, which  includes using toliet, bedpan, or urinal?: Total ?Help from another person bathing (including washing, rinsing, drying)?: A Lot ?Help from another person to put on and taking off  regular upper body clothing?: A Little ?Help from another person to put on and taking off regular lower body clothing?: A Lot ?6 Click Score: 15 ?  ?End of Session Equipment Utilized During Treatment: Gait belt;Ro

## 2022-03-03 DIAGNOSIS — G9341 Metabolic encephalopathy: Secondary | ICD-10-CM | POA: Diagnosis not present

## 2022-03-03 DIAGNOSIS — I509 Heart failure, unspecified: Secondary | ICD-10-CM | POA: Diagnosis not present

## 2022-03-03 DIAGNOSIS — I5033 Acute on chronic diastolic (congestive) heart failure: Secondary | ICD-10-CM | POA: Diagnosis not present

## 2022-03-03 LAB — CBC
HCT: 31.2 % — ABNORMAL LOW (ref 39.0–52.0)
Hemoglobin: 10.3 g/dL — ABNORMAL LOW (ref 13.0–17.0)
MCH: 34.2 pg — ABNORMAL HIGH (ref 26.0–34.0)
MCHC: 33 g/dL (ref 30.0–36.0)
MCV: 103.7 fL — ABNORMAL HIGH (ref 80.0–100.0)
Platelets: 196 10*3/uL (ref 150–400)
RBC: 3.01 MIL/uL — ABNORMAL LOW (ref 4.22–5.81)
RDW: 16.4 % — ABNORMAL HIGH (ref 11.5–15.5)
WBC: 7 10*3/uL (ref 4.0–10.5)
nRBC: 0 % (ref 0.0–0.2)

## 2022-03-03 LAB — BASIC METABOLIC PANEL
Anion gap: 8 (ref 5–15)
BUN: 42 mg/dL — ABNORMAL HIGH (ref 8–23)
CO2: 30 mmol/L (ref 22–32)
Calcium: 8.4 mg/dL — ABNORMAL LOW (ref 8.9–10.3)
Chloride: 99 mmol/L (ref 98–111)
Creatinine, Ser: 1.23 mg/dL (ref 0.61–1.24)
GFR, Estimated: 57 mL/min — ABNORMAL LOW (ref >60)
Glucose, Bld: 254 mg/dL — ABNORMAL HIGH (ref 70–99)
Potassium: 4 mmol/L (ref 3.5–5.1)
Sodium: 137 mmol/L (ref 135–145)

## 2022-03-03 LAB — GLUCOSE, CAPILLARY
Glucose-Capillary: 207 mg/dL — ABNORMAL HIGH (ref 70–99)
Glucose-Capillary: 271 mg/dL — ABNORMAL HIGH (ref 70–99)
Glucose-Capillary: 185 mg/dL — ABNORMAL HIGH (ref 70–99)
Glucose-Capillary: 259 mg/dL — ABNORMAL HIGH (ref 70–99)

## 2022-03-03 MED ORDER — FUROSEMIDE 10 MG/ML IJ SOLN
20.0000 mg | Freq: Two times a day (BID) | INTRAMUSCULAR | Status: DC
  Administered 2022-03-03 – 2022-03-05 (×4): 20 mg via INTRAVENOUS
  Filled 2022-03-03 (×4): qty 2

## 2022-03-03 MED ORDER — ORAL CARE MOUTH RINSE
15.0000 mL | Freq: Two times a day (BID) | OROMUCOSAL | Status: DC
  Administered 2022-03-04 – 2022-03-07 (×7): 15 mL via OROMUCOSAL

## 2022-03-03 MED ORDER — INSULIN DETEMIR 100 UNIT/ML ~~LOC~~ SOLN
25.0000 [IU] | Freq: Every day | SUBCUTANEOUS | Status: DC
  Administered 2022-03-04 – 2022-03-07 (×4): 25 [IU] via SUBCUTANEOUS
  Filled 2022-03-03 (×4): qty 0.25

## 2022-03-03 NOTE — TOC Initial Note (Cosign Needed)
Transition of Care (TOC) - Initial/Assessment Note  ? ? ?Patient Details  ?Name: [name] ?MRN: [id number] ?Date of Birth: 09-22-1933 ? ?Transition of Care (TOC) CM/SW Contact:    ?Bary Castilla, LCSW ?Phone Number:[phone] ?03/03/2022, 9:12 AM ? ?Clinical Narrative:                 ? ?CSW initially met pt at bedside to discuss the recommendation of a SNF. Pt asked for CSW to call his daughter Alba Cory due to her being his POA and she would let him know the decision. ? ?CSW spoke with Alba Cory to discuss PT recommendation of a SNF. Sherrie Ann was aware of recommendation and in agreement with going to a [facility] SNF. CSW discussed the SNF process.Alba Cory gave CSW permission to fax referrals out to local facilities.CSW answered questions about the SNF process and the next steps in the process. ? ?Alba Cory explained that pt's wife is at [facility] and wishes for pt to go to [facility] as well. Pt has been at [facility] as well. ? ?TOC team will continue to assist with discharge planning needs.  ? ?  ? ?Expected Discharge Plan: [place] ?Barriers to Discharge: Continued Medical Work up, SNF Pending bed offer, Insurance Authorization ? ? ?Patient Goals and CMS Choice ?  ?CMS Medicare.gov Compare Post Acute Care list provided to:: Patient Represenative (must comment) (Daughter) ?  ? ?Expected Discharge Plan and Services ?Expected Discharge Plan: [place] ?  ?  ?  ?Living arrangements for the past 2 months: [place] ?                ?  ?  ?  ?  ?  ?  ?  ?  ?  ?  ? ?Prior Living Arrangements/Services ?Living arrangements for the past 2 months: [place] ?Lives with:: Self ?Patient language and need for interpreter reviewed:: Yes ?       ?  ?Care giver support system in place?: Yes (comment) ?  ?  ? ?Activities of Daily Living ?Home Assistive Devices/Equipment: Bedside commode/3-in-1, Walker (specify type), Wheelchair ?ADL Screening (condition at time of  admission) ?Patient's cognitive ability adequate to safely complete daily activities?: No ?Is the patient deaf or have difficulty hearing?: Yes ?Does the patient have difficulty seeing, even when wearing glasses/contacts?: No ?Does the patient have difficulty concentrating, remembering, or making decisions?: Yes ?Patient able to express need for assistance with ADLs?: Yes ?Does the patient have difficulty dressing or bathing?: Yes ?Independently performs ADLs?: No ?Communication: Independent ?Dressing (OT): Needs assistance ?Is this a change from baseline?: Change from baseline, expected to last <3days ?Grooming: Needs assistance ?Is this a change from baseline?: Change from baseline, expected to last <3 days ?Feeding: Independent ?Bathing: Needs assistance ?Is this a change from baseline?: Change from baseline, expected to last <3 days ?Toileting: Needs assistance ?Is this a change from baseline?: Change from baseline, expected to last <3 days ?In/Out Bed: Needs assistance ?Is this a change from baseline?: Change from baseline, expected to last <3 days ?Walks in Home: Needs assistance ?Is this a change from baseline?: Change from baseline, expected to last <3 days ?Does the patient have difficulty walking or climbing stairs?: Yes ?Weakness of Legs: Both ?Weakness of Arms/Hands: Both ? ?Permission Sought/Granted ?  ?Permission granted to share information with : Yes, Verbal Permission Granted ? Share Information with NAME: Alba Cory ? Permission granted to share info w AGENCY: SNFs ?  Permission granted to share info w Relationship: Daughter ? Permission granted to share info w Contact Information: [phone] ? ?Emotional Assessment ?Appearance:: Appears stated age ?Attitude/Demeanor/Rapport: Engaged ?Affect (typically observed): Accepting ?Orientation: : Oriented to Self, Oriented to Place, Oriented to  Time, Oriented to Situation ?  ?  ? ?Admission diagnosis:  Weakness [R53.1] ?Failure to thrive in adult  [R62.7] ?Heart failure with preserved ejection fraction ([place]) [I50.30] ?Acute on chronic congestive heart failure, unspecified heart failure type ([place]) [I50.9] ?Patient Active Problem List  ? Diagnosis Date Noted  ? Heart failure with preserved ejection fraction ([place]) 03/01/2022  ? Protein-calorie malnutrition, severe 03/01/2022  ? Elevated troponin 03/01/2022  ? SIRS (systemic inflammatory response syndrome) ([place]) 03/01/2022  ? Acute metabolic encephalopathy 70/92/9574  ? Gastroesophageal reflux disease 01/07/2022  ? [place] 12/20/2021  ? Weakness 12/13/2021  ? Unspecified protein-calorie malnutrition ([place]) 10/11/2021  ? Abdominal bloating 10/07/2021  ? Physical deconditioning 10/05/2021  ? Pressure injury of skin 10/03/2021  ? Dizziness 10/03/2021  ? Coagulopathy ([place]) 10/02/2021  ? Acute GI bleeding 10/01/2021  ? Hardening of the aorta (main artery of the heart) ([place]) 09/24/2021  ? Essential hypertension 09/24/2021  ? Neuropathy 06/15/2021  ? Recurrent falls 06/15/2021  ? Diabetic neuropathy ([place]) 03/09/2021  ? Diabetic retinopathy ([place]) 03/09/2021  ? Acute blood loss anemia, symptomatic secondary to GI bleed 03/09/2021  ? Other thrombophilia ([place]) 03/09/2021  ? Prostate cancer ([place]) 03/09/2021  ? 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 Nonrheumatic mitral valve regurgitation 09/11/2020  ? Nonrheumatic tricuspid valve regurgitation 09/11/2020  ? PAD (peripheral artery disease) ([place]) 08/22/2017  ? Absolute anemia 04/02/2016  ? Diabetes mellitus with coincident hypertension ([place]) 04/02/2016  ? Long term current use of anticoagulant 04/02/2016  ? Macrocytosis 04/02/2016  ? Arthritis, degenerative 04/02/2016  ? History of colon polyps 04/02/2016  ? Carcinoma of prostate ([place]) 04/02/2016  ? Abdominal discomfort, epigastric 12/20/2015  ? H/O adenomatous polyp of colon 12/20/2015  ? Left upper quadrant pain 03/16/2015  ? Chronic heart failure with preserved ejection  fraction ([place]) 08/30/2014  ? S/P aortic valve replacement with bioprosthetic valve 01/19/2013  ? Hyperlipemia 12/21/2012  ? H/O prostate cancer 12/21/2012  ? Anticoagulated on Coumadin 12/21/2012  ? HYPERTENSION, CONTROLLED 09/06/2009  ? Chronic atrial fibrillation with RVR ([place]) 09/06/2009  ? ?PCP:  [name], MD ?Pharmacy:   ?[facility], [place] - [address] ?[address] ?[place] [address] ?Phone: [phone] Fax: [phone] ? ? ? ? ?Social Determinants of Health (SDOH) Interventions ?  ? ?Readmission Risk Interventions ?   ? View : No data to display.  ?  ?  ?  ? ? ? ?

## 2022-03-03 NOTE — Progress Notes (Signed)
? ?Progress Note ? ?Patient Name: [name] ?Date of Encounter: 03/03/2022 ? ?[facility] Cardiologist: [name], MD  ? ?Subjective  ? ?Some improvement in SOB ? ?Inpatient Medications  ?  ?Scheduled Meds: ? aspirin EC  81 mg Oral Daily  ? dapagliflozin propanediol  10 mg Oral Daily  ? enoxaparin (LOVENOX) injection  40 mg Subcutaneous Q24H  ? feeding supplement  237 mL Oral BID BM  ? feeding supplement (GLUCERNA SHAKE)  237 mL Oral TID BM  ? fluticasone  1 spray Each Nare Daily  ? gabapentin  100 mg Oral QHS  ? insulin aspart  0-5 Units Subcutaneous QHS  ? insulin aspart  0-9 Units Subcutaneous TID WC  ? insulin detemir  15 Units Subcutaneous Daily  ? loratadine  10 mg Oral Daily  ? metoprolol succinate  12.5 mg Oral Daily  ? multivitamin with minerals  1 tablet Oral Daily  ? pantoprazole  40 mg Oral Daily  ? rosuvastatin  5 mg Oral QHS  ? sodium chloride flush  3 mL Intravenous Q12H  ? ?Continuous Infusions: ? sodium chloride    ? ?PRN Meds: ?sodium chloride, acetaminophen, ondansetron (ZOFRAN) IV, sodium chloride flush  ? ?Vital Signs  ?  ?Vitals:  ? 03/02/22 1937 03/03/22 0019 03/03/22 0408 03/03/22 0428  ?BP: (!) 107/46 105/71 (!) 99/56   ?Pulse: 76 85 78   ?Resp: _0 ?Temp: (!) 97.5 ?F (36.4 ?C) 97.7 ?F (36.5 ?C) 97.7 ?F (36.5 ?C)   ?TempSrc: Oral Oral Oral Oral  ?SpO2: 98% 99% 99% 99%  ?Weight:  64.2 kg    ?Height:      ? ? ?Intake/Output Summary (Last 24 hours) at 03/03/2022 0915 ?Last data filed at 03/03/2022 0410 ?Gross per 24 hour  ?Intake 240 ml  ?Output 1900 ml  ?Net -1660 ml  ? ? ?  03/03/2022  ? 12:19 AM 03/02/2022  ?  4:55 AM 03/01/2022  ?  3:15 PM  ?Last 3 Weights  ?Weight (lbs) 141 lb 8.6 oz 138 lb 7.2 oz 141 lb 6.4 oz  ?Weight (kg) 64.2 kg 62.8 kg 64.139 kg  ?   ? ?Telemetry  ?  ?Rate controlled afib - Personally Reviewed ? ?ECG  ?  ?N/a - Personally Reviewed ? ?Physical Exam  ? ?GEN: No acute distress.   ?Neck: +JVD ?Cardiac: irreg, 3/6 systolic murmur  apex ?Respiratory: crackles bilaterally ?GI: Soft, nontender, non-distended  ?MS: No edema; No deformity. ?Neuro:  Nonfocal  ?Psych: Normal affect  ? ?Labs  ?  ?High Sensitivity Troponin:   ?Recent Labs  ?Lab 03/01/22 ?8366 03/01/22 ?1300  ?TROPONINIHS 26* 31*  ?   ?Chemistry ?Recent Labs  ?Lab 03/01/22 ?2947 03/02/22 ?0336 03/03/22 ?0245  ?NA 142 141 137  ?K 4.0 3.8 4.0  ?CL 99 99 99  ?CO2 _1 ?GLUCOSE 219* 305* 254*  ?BUN 34* 37* 42*  ?CREATININE 1.46* 1.37* 1.23  ?CALCIUM 9.0 8.9 8.4*  ?PROT 6.0*  --   --   ?ALBUMIN 3.1*  --   --   ?AST 26  --   --   ?ALT 17  --   --   ?ALKPHOS 76  --   --   ?BILITOT 1.4*  --   --   ?GFRNONAA 46* 50* 57*  ?ANIONGAP _2 ?  ?Lipids No results for input(s): CHOL, TRIG, HDL, LABVLDL, LDLCALC, CHOLHDL in the last 168 hours.  ?Hematology ?Recent Labs  ?Lab  03/01/22 ?9242 03/02/22 ?0336 03/03/22 ?0245  ?WBC 8.7 6.6 7.0  ?RBC 3.18* 3.27* 3.01*  ?HGB 10.6* 10.8* 10.3*  ?HCT 33.5* 34.5* 31.2*  ?MCV 105.3* 105.5* 103.7*  ?MCH 33.3 33.0 34.2*  ?MCHC 31.6 31.3 33.0  ?RDW 16.5* 16.7* 16.4*  ?PLT 186 199 196  ? ?Thyroid  ?Recent Labs  ?Lab 03/01/22 ?1413  ?TSH 1.200  ?  ?BNP ?Recent Labs  ?Lab 03/01/22 ?0847  ?BNP 2,092.5*  ?  ?DDimer No results for input(s): DDIMER in the last 168 hours.  ? ?Radiology  ?  ?MR BRAIN WO CONTRAST ? ?Result Date: 03/01/2022 ?CLINICAL DATA:  Mental status change, failure to thrive EXAM: MRI HEAD WITHOUT CONTRAST TECHNIQUE: Multiplanar, multiecho pulse sequences of the brain and surrounding structures were obtained without intravenous contrast. COMPARISON:  No prior MRI, correlation is made with CT head 12/13/2021 FINDINGS: Brain: No restricted diffusion to suggest acute or subacute infarct. No acute hemorrhage, mass, mass effect, or midline shift. No hydrocephalus. Commensurate sulcal and ventricular prominence. Redemonstrated prominent extra-axial space along the right cerebral convexity, possibly a thin hygroma, unchanged from the prior CT. No new  extra-axial collection. No hemosiderin deposition to suggest remote hemorrhage or superficial siderosis. T2 hyperintense signal in the periventricular white matter, likely the sequela of chronic small vessel ischemic disease. Vascular: Normal flow voids. Skull and upper cervical spine: Normal marrow signal. Sinuses/Orbits: Clear paranasal sinuses. Status post bilateral lens replacements. Other: Fluid in the right-greater-than-left mastoid air cells. IMPRESSION: No acute intracranial process. No etiology seen for the patient's altered mental status. Electronically Signed   By: Merilyn Baba M.D.   On: 03/01/2022 22:26  ? ?ECHOCARDIOGRAM COMPLETE ? ?Result Date: 03/02/2022 ?   ECHOCARDIOGRAM REPORT   Patient Name:   NAKOTA ELSEN Date of Exam: 03/02/2022 Medical Rec #:  [id number]         Height:       68.0 in Accession #:    [id number]        Weight:       138.4 lb Date of Birth:  1933-08-17          BSA:          1.748 m? Patient Age:    67 years          BP:           87/61 mmHg Patient Gender: M                 HR:           96 bpm. Exam Location:  Inpatient Procedure: 2D Echo, Cardiac Doppler and Color Doppler Indications:    Dyspnea  History:        Patient has prior history of Echocardiogram examinations, most                 recent 11/21/2016. Pulmonary HTN, Aortic Valve Disease and Mitral                 Valve Disease; Risk Factors:Hypertension.  Sonographer:    Merrie Roof RDCS Referring Phys: [place]  1. Left ventricular ejection fraction, by estimation, is 55 to 60%. The left ventricle has normal function. The left ventricle has no regional wall motion abnormalities. Left ventricular diastolic parameters are indeterminate.  2. Right ventricular systolic function is moderately reduced. The right ventricular size is mildly enlarged. There is severely elevated pulmonary artery systolic pressure.  3. Left atrial size was severely dilated.  4. Right atrial  size was severely dilated.  5.  ERO > .4 similar to prior echo and PISA radius 1 MAC with leaflet thickening and restricted posterior leaflet . The mitral valve is normal in structure. Severe mitral valve regurgitation. No evidence of mitral stenosis.  6. Tricuspid valve regurgitation is severe.  7. Post TAVR with 26 mm Sapien 3 mild PVL seen best on SA views at 7:00 mean gradient 6 mmHg lower than TTE 11/21/21 . The aortic valve has been repaired/replaced. Aortic valve regurgitation is not visualized. No aortic stenosis is present.  8. The inferior vena cava is dilated in size with >50% respiratory variability, suggesting right atrial pressure of 8 mmHg. FINDINGS  Left Ventricle: Left ventricular ejection fraction, by estimation, is 55 to 60%. The left ventricle has normal function. The left ventricle has no regional wall motion abnormalities. The left ventricular internal cavity size was normal in size. There is  no left ventricular hypertrophy. Left ventricular diastolic parameters are indeterminate. Right Ventricle: The right ventricular size is mildly enlarged. No increase in right ventricular wall thickness. Right ventricular systolic function is moderately reduced. There is severely elevated pulmonary artery systolic pressure. The tricuspid regurgitant velocity is 3.69 m/s, and with an assumed right atrial pressure of 15 mmHg, the estimated right ventricular systolic pressure is 11.9 mmHg. Left Atrium: Left atrial size was severely dilated. Right Atrium: Right atrial size was severely dilated. Pericardium: There is no evidence of pericardial effusion. Mitral Valve: ERO > .4 similar to prior echo and PISA radius 1 MAC with leaflet thickening and restricted posterior leaflet. The mitral valve is normal in structure. Severe mitral valve regurgitation. No evidence of mitral valve stenosis. Tricuspid Valve: The tricuspid valve is normal in structure. Tricuspid valve regurgitation is severe. No evidence of tricuspid stenosis. Aortic Valve: Post  TAVR with 26 mm Sapien 3 mild PVL seen best on SA views at 7:00 mean gradient 6 mmHg lower than TTE 11/21/21. The aortic valve has been repaired/replaced. Aortic valve regurgitation is not visualized. No aortic stenosis is pres

## 2022-03-03 NOTE — Progress Notes (Addendum)
?PROGRESS NOTE ? ? ? ?[name]  HCW:[id number] DOB: 1932-12-17 DOA: 03/01/2022 ?PCP: [name], MD  ?88 y.o. male with history of CAD, AS s/p TAVR, chronic diastolic CHF, severe mitral regurgitation, severe PAH, paroxysmal atrial fibrillation, no longer on anticoagulation due to recurrent GI bleeds, chronic anemia was brought to the ED on account of weakness, confusion and failure to thrive. ?-Also recently had increased swelling, torsemide dose was increased 10 days ago, also having intermittent hallucinations, just completed course of antibiotics for UTI 2 days prior to admission ?-In the emergency room he was afebrile, labs noted creatinine of 1.46, BNP 2092, chest x-ray noted small right pleural effusion and cardiomegaly ?-Started on IV Lasix ? ?Subjective: ?-Feels okay, no events overnight, breathing is improving ? ?Assessment and Plan: ? ?Acute on chronic diastolic CHF  ?Right heart failure, severe MR, severe TR  ?Severe PAH ?History of TAVR ?-Diuresing on IV Lasix, he is 4 L negative ?-Cardiology following, due to advanced age and frailty he would not be considered a candidate for transcatheter mitral valve repair ?-Continue metoprolol, may need DCCV for A-fib ?-Farxiga resumed ?-Overall prognosis appears somewhat poor, will request palliative consult for goals of care ?-PT OT eval completed, SNF recommended for short-term rehab ? ?Mild lactic acidosis ?-Likely secondary to right heart failure, clinically do not suspect acute infectious process at this time ?-Lactic acidosis has resolved, he is afebrile without leukocytosis, urinalysis and chest x-ray unremarkable ?-Stable off antibiotics ?  ?Acute metabolic encephalopathy ?-Likely delirium or sundowning ?-Recent history of hallucinations, he is awake and alert, oriented x3   with mild cognitive deficits only, MRI brain was unremarkable, infectious work-up also unrevealing ?-TSH is normal ?  ?Acute kidney injury ?-Mild AKI, likely cardiorenal,  monitor diuretics ?  ?Chronic atrial fibrillation ?-Heart rate improving, continue metoprolol , no longer on anticoagulation due to recurrent GI bleed  ?  ?Essential hypertension ?-BP soft now, continue metoprolol ? ?Anemia of chronic disease ?-Hemoglobin stable ?  ?Diabetes mellitus type 2, well controlled ?- Last hemoglobin A1c was 5.4 on 12/15/2021. ?-CBGs elevated, started on Levemir yesterday, will increase dose ?  ?Prostate cancer ?-Continue Casodex ?  ?Moderate protein calorie malnutrition ?Albumin 3.1 ? ?Chronic leg wounds ?-Routine wound care ?  ?Advance Care Planning:   Code Status: Full Code  ?  ?Consults: Cardiology ?  ?Family Communication: No family at bedside, will update daughter ?  ? ?Procedures:  ? ?Antimicrobials:  ? ? ?Objective: ?Vitals:  ? 03/03/22 0019 03/03/22 0408 03/03/22 0428 03/03/22 1013  ?BP: 105/71 (!) 99/56  109/62  ?Pulse: 85 78  86  ?Resp: '18 18  18  '$ ?Temp: 97.7 ?F (36.5 ?C) 97.7 ?F (36.5 ?C)  97.9 ?F (36.6 ?C)  ?TempSrc: Oral Oral Oral Oral  ?SpO2: 99% 99% 99% 98%  ?Weight: 64.2 kg     ?Height:      ? ? ?Intake/Output Summary (Last 24 hours) at 03/03/2022 1058 ?Last data filed at 03/03/2022 0930 ?Gross per 24 hour  ?Intake 360 ml  ?Output 1900 ml  ?Net -1540 ml  ? ?Filed Weights  ? 03/01/22 1515 03/02/22 0455 03/03/22 0019  ?Weight: 64.1 kg 62.8 kg 64.2 kg  ? ? ?Examination: ? ?General exam: Pleasant chronically ill elderly male sitting up in bed, awake alert oriented x2, no distress, mild cognitive deficits ?CVS: S1-S2, regular rhythm, systolic murmur ?Lungs: Few basilar rales, otherwise clear ?Abdomen: Firm, mildly distended, bowel sounds present ?Extremities: No edema, chronic venous stasis and scaling in both legs,  superficial wounds with Tegaderm ?Psychiatry:  Mood & affect appropriate.  ? ? ? ?Data Reviewed:  ? ?CBC: ?Recent Labs  ?Lab 03/01/22 ?3500 03/02/22 ?0336 03/03/22 ?0245  ?WBC 8.7 6.6 7.0  ?NEUTROABS 7.2  --   --   ?HGB 10.6* 10.8* 10.3*  ?HCT 33.5* 34.5* 31.2*  ?MCV  105.3* 105.5* 103.7*  ?PLT 186 199 196  ? ?Basic Metabolic Panel: ?Recent Labs  ?Lab 03/01/22 ?9381 03/02/22 ?0336 03/03/22 ?0245  ?NA 142 141 137  ?K 4.0 3.8 4.0  ?CL 99 99 99  ?CO2 '30 31 30  '$ ?GLUCOSE 219* 305* 254*  ?BUN 34* 37* 42*  ?CREATININE 1.46* 1.37* 1.23  ?CALCIUM 9.0 8.9 8.4*  ? ?GFR: ?Estimated Creatinine Clearance: 38.4 mL/min (by C-G formula based on SCr of 1.23 mg/dL). ?Liver Function Tests: ?Recent Labs  ?Lab 03/01/22 ?0847  ?AST 26  ?ALT 17  ?ALKPHOS 76  ?BILITOT 1.4*  ?PROT 6.0*  ?ALBUMIN 3.1*  ? ?No results for input(s): LIPASE, AMYLASE in the last 168 hours. ?No results for input(s): AMMONIA in the last 168 hours. ?Coagulation Profile: ?No results for input(s): INR, PROTIME in the last 168 hours. ?Cardiac Enzymes: ?No results for input(s): CKTOTAL, CKMB, CKMBINDEX, TROPONINI in the last 168 hours. ?BNP (last 3 results) ?Recent Labs  ?  12/05/21 ?0915  ?PROBNP 6,343*  ? ?HbA1C: ?No results for input(s): HGBA1C in the last 72 hours. ?CBG: ?Recent Labs  ?Lab 03/02/22 ?0606 03/02/22 ?1210 03/02/22 ?1619 03/02/22 ?2040 03/03/22 ?8299  ?GLUCAP 311* 285* 242* 110* 207*  ? ?Lipid Profile: ?No results for input(s): CHOL, HDL, LDLCALC, TRIG, CHOLHDL, LDLDIRECT in the last 72 hours. ?Thyroid Function Tests: ?Recent Labs  ?  03/01/22 ?1413  ?TSH 1.200  ? ?Anemia Panel: ?No results for input(s): VITAMINB12, FOLATE, FERRITIN, TIBC, IRON, RETICCTPCT in the last 72 hours. ?Urine analysis: ?   ?Component Value Date/Time  ? COLORURINE YELLOW 03/01/2022 1300  ? APPEARANCEUR CLEAR 03/01/2022 1300  ? LABSPEC 1.012 03/01/2022 1300  ? LABSPEC 1.010 02/05/2006 1006  ? PHURINE 6.0 03/01/2022 1300  ? GLUCOSEU >=500 (A) 03/01/2022 1300  ? HGBUR NEGATIVE 03/01/2022 1300  ? [place] NEGATIVE 03/01/2022 1300  ? BILIRUBINUR Negative 02/05/2006 1006  ? [place] NEGATIVE 03/01/2022 1300  ? PROTEINUR NEGATIVE 03/01/2022 1300  ? UROBILINOGEN 2.0 (H) 01/15/2013 1411  ? NITRITE NEGATIVE 03/01/2022 1300  ? LEUKOCYTESUR NEGATIVE  03/01/2022 1300  ? LEUKOCYTESUR Negative 02/05/2006 1006  ? ?Sepsis Labs: ?'@LABRCNTIP'$ (procalcitonin:4,lacticidven:4) ? ?) ?Recent Results (from the past 240 hour(s))  ?Urine Culture     Status: Abnormal  ? Collection Time: 03/01/22  1:00 PM  ? Specimen: Urine, Clean Catch  ?Result Value Ref Range Status  ? Specimen Description URINE, CLEAN CATCH  Final  ? Special Requests NONE  Final  ? Culture (A)  Final  ?  <10,000 COLONIES/mL INSIGNIFICANT GROWTH ?Performed at [facility], [place] [address], [place], [place] [address] ?  ? Report Status 03/02/2022 FINAL  Final  ?Culture, blood (Routine X 2) w Reflex to ID Panel     Status: None (Preliminary result)  ? Collection Time: 03/01/22  6:42 PM  ? Specimen: BLOOD  ?Result Value Ref Range Status  ? Specimen Description BLOOD RIGHT ANTECUBITAL  Final  ? Special Requests   Final  ?  BOTTLES DRAWN AEROBIC AND ANAEROBIC Blood Culture adequate volume  ? Culture   Final  ?  NO GROWTH 2 DAYS ?Performed at [facility], [place] [address] [place], [place] [address] ?  ?  Report Status PENDING  Incomplete  ?Culture, blood (Routine X 2) w Reflex to ID Panel     Status: None (Preliminary result)  ? Collection Time: 03/01/22  6:42 PM  ? Specimen: BLOOD  ?Result Value Ref Range Status  ? Specimen Description BLOOD LEFT ANTECUBITAL  Final  ? Special Requests   Final  ?  BOTTLES DRAWN AEROBIC AND ANAEROBIC Blood Culture adequate volume  ? Culture   Final  ?  NO GROWTH 2 DAYS ?Performed at [facility], [place] [address], [place], [place] [address] ?  ? Report Status PENDING  Incomplete  ?  ? ?Radiology Studies: ?MR BRAIN WO CONTRAST ? ?Result Date: 03/01/2022 ?CLINICAL DATA:  Mental status change, failure to thrive EXAM: MRI HEAD WITHOUT CONTRAST TECHNIQUE: Multiplanar, multiecho pulse sequences of the brain and surrounding structures were obtained without intravenous contrast. COMPARISON:  No prior MRI, correlation is made with CT head 12/13/2021 FINDINGS: Brain: No restricted  diffusion to suggest acute or subacute infarct. No acute hemorrhage, mass, mass effect, or midline shift. No hydrocephalus. Commensurate sulcal and ventricular prominence. Redemonstrated prominent extra-axial

## 2022-03-03 NOTE — [place] FL2 (Signed)
?[place] MEDICAID FL2 LEVEL OF CARE SCREENING TOOL  ?  ? ?IDENTIFICATION  ?Patient Name: ?[name] Birthdate: Sep 19, 1933 Sex: male Admission Date (Current Location): ?03/01/2022  ?[place] and [place] Number: ? [place] ?  Facility and Address:  ?[facility], [place] [address], [place], [place] [address] ?     Provider Number: ?[id number]  ?Attending Physician Name and Address:  ?Domenic Polite, MD ? Relative Name and Phone Number:  ?[place] [phone] ?   ?Current Level of Care: ?Hospital Recommended Level of Care: ?[place] Prior Approval Number: ?  ? ?Date Approved/Denied: ?  PASRR Number: ?[id number] A ? ?Discharge Plan: ?SNF ?  ? ?Current Diagnoses: ?Patient Active Problem List  ? Diagnosis Date Noted  ? Heart failure with preserved ejection fraction ([place]) 03/01/2022  ? Protein-calorie malnutrition, severe 03/01/2022  ? Elevated troponin 03/01/2022  ? SIRS (systemic inflammatory response syndrome) ([place]) 03/01/2022  ? Acute metabolic encephalopathy 93/79/0240  ? Gastroesophageal reflux disease 01/07/2022  ? [place] 12/20/2021  ? Weakness 12/13/2021  ? Unspecified protein-calorie malnutrition ([place]) 10/11/2021  ? Abdominal bloating 10/07/2021  ? Physical deconditioning 10/05/2021  ? Pressure injury of skin 10/03/2021  ? Dizziness 10/03/2021  ? Coagulopathy ([place]) 10/02/2021  ? Acute GI bleeding 10/01/2021  ? Hardening of the aorta (main artery of the heart) ([place]) 09/24/2021  ? Essential hypertension 09/24/2021  ? Neuropathy 06/15/2021  ? Recurrent falls 06/15/2021  ? Diabetic neuropathy ([place]) 03/09/2021  ? Diabetic retinopathy ([place]) 03/09/2021  ? Acute blood loss anemia, symptomatic secondary to GI bleed 03/09/2021  ? Other thrombophilia ([place]) 03/09/2021  ? Prostate cancer ([place]) 03/09/2021  ? 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 Nonrheumatic mitral valve regurgitation 09/11/2020  ? Nonrheumatic tricuspid valve  regurgitation 09/11/2020  ? PAD (peripheral artery disease) ([place]) 08/22/2017  ? Absolute anemia 04/02/2016  ? Diabetes mellitus with coincident hypertension ([place]) 04/02/2016  ? Long term current use of anticoagulant 04/02/2016  ? Macrocytosis 04/02/2016  ? Arthritis, degenerative 04/02/2016  ? History of colon polyps 04/02/2016  ? Carcinoma of prostate ([place]) 04/02/2016  ? Abdominal discomfort, epigastric 12/20/2015  ? H/O adenomatous polyp of colon 12/20/2015  ? Left upper quadrant pain 03/16/2015  ? Chronic heart failure with preserved ejection fraction ([place]) 08/30/2014  ? S/P aortic valve replacement with bioprosthetic valve 01/19/2013  ? Hyperlipemia 12/21/2012  ? H/O prostate cancer 12/21/2012  ? Anticoagulated on Coumadin 12/21/2012  ? HYPERTENSION, CONTROLLED 09/06/2009  ? Chronic atrial fibrillation with RVR ([place]) 09/06/2009  ? ? ?Orientation RESPIRATION BLADDER Height & Weight   ?  ?Self, Time, Situation, Place ? Normal Continent, External catheter Weight: 141 lb 8.6 oz (64.2 kg) ?Height:  '5\' 8"'$  (172.7 cm)  ?BEHAVIORAL SYMPTOMS/MOOD NEUROLOGICAL BOWEL NUTRITION STATUS  ?    Continent Diet (See DC summary)  ?AMBULATORY STATUS COMMUNICATION OF NEEDS Skin   ?Limited Assist Verbally PU Stage and Appropriate Care ?  ?  ?  ?    ?     ?     ? ? ?Personal Care Assistance Level of Assistance  ?Bathing, Feeding, Dressing Bathing Assistance: Limited assistance ?Feeding assistance: Limited assistance ?Dressing Assistance: Limited assistance ?   ? ?Functional Limitations Info  ?Sight, Hearing, Speech Sight Info: Adequate ?Hearing Info: Adequate ?Speech Info: Adequate  ? ? ?SPECIAL CARE FACTORS FREQUENCY  ?PT (By licensed PT), OT (By licensed OT)   ?  ?PT Frequency: 5x per week ?OT Frequency: 5x per week ?  ?  ?  ?   ? ? ?  Contractures Contractures Info: Not present  ? ? ?Additional Factors Info  ?Code Status, Allergies, Insulin Sliding Scale Code Status Info: Full ?Allergies Info: Amoxicillin ?  ?Insulin Sliding Scale  Info: See DC summary ?  ?   ? ?Current Medications (03/03/2022):  This is the current hospital active medication list ?Current Facility-Administered Medications  ?Medication Dose Route Frequency Provider Last Rate Last Admin  ? 0.9 %  sodium chloride infusion  250 mL Intravenous PRN Fuller Plan A, MD      ? acetaminophen (TYLENOL) tablet 650 mg  650 mg Oral Q4H PRN Fuller Plan A, MD   650 mg at 03/02/22 2135  ? aspirin EC tablet 81 mg  81 mg Oral Daily Fuller Plan A, MD   81 mg at 03/03/22 0905  ? dapagliflozin propanediol (FARXIGA) tablet 10 mg  10 mg Oral Daily Arnoldo Lenis, MD   10 mg at 03/03/22 1610  ? enoxaparin (LOVENOX) injection 40 mg  40 mg Subcutaneous Q24H Fuller Plan A, MD   40 mg at 03/02/22 1233  ? feeding supplement (ENSURE ENLIVE / ENSURE PLUS) liquid 237 mL  237 mL Oral BID BM Smith, Rondell A, MD      ? feeding supplement (GLUCERNA SHAKE) (GLUCERNA SHAKE) liquid 237 mL  237 mL Oral TID BM Smith, Rondell A, MD   237 mL at 03/02/22 2141  ? fluticasone (FLONASE) 50 MCG/ACT nasal spray 1 spray  1 spray Each Nare Daily Fuller Plan A, MD   1 spray at 03/03/22 0904  ? furosemide (LASIX) injection 20 mg  20 mg Intravenous BID Arnoldo Lenis, MD      ? gabapentin (NEURONTIN) capsule 100 mg  100 mg Oral QHS Smith, Rondell A, MD   100 mg at 03/02/22 2135  ? insulin aspart (novoLOG) injection 0-5 Units  0-5 Units Subcutaneous QHS Norval Morton, MD   4 Units at 03/01/22 2313  ? insulin aspart (novoLOG) injection 0-9 Units  0-9 Units Subcutaneous TID WC Norval Morton, MD   3 Units at 03/02/22 1746  ? insulin detemir (LEVEMIR) injection 15 Units  15 Units Subcutaneous Daily Domenic Polite, MD   15 Units at 03/03/22 9604  ? loratadine (CLARITIN) tablet 10 mg  10 mg Oral Daily Fuller Plan A, MD   10 mg at 03/03/22 5409  ? metoprolol succinate (TOPROL-XL) 24 hr tablet 12.5 mg  12.5 mg Oral Daily Tamala Julian, Rondell A, MD   12.5 mg at 03/03/22 8119  ? multivitamin with minerals tablet 1  tablet  1 tablet Oral Daily Fuller Plan A, MD   1 tablet at 03/03/22 1478  ? ondansetron (ZOFRAN) injection 4 mg  4 mg Intravenous Q6H PRN Fuller Plan A, MD      ? pantoprazole (PROTONIX) EC tablet 40 mg  40 mg Oral Daily Fuller Plan A, MD   40 mg at 03/03/22 0904  ? rosuvastatin (CRESTOR) tablet 5 mg  5 mg Oral QHS Smith, Rondell A, MD   5 mg at 03/02/22 2135  ? sodium chloride flush (NS) 0.9 % injection 3 mL  3 mL Intravenous Q12H Fuller Plan A, MD   3 mL at 03/03/22 0906  ? sodium chloride flush (NS) 0.9 % injection 3 mL  3 mL Intravenous PRN Norval Morton, MD      ? ? ? ?Discharge Medications: ?Please see discharge summary for a list of discharge medications. ? ?Relevant Imaging Results: ? ?Relevant Lab Results: ? ? ?Additional Information ?SSN# [id number]  Vaccinated ? ?Bary Castilla, LCSW ? ? ? ? ?

## 2022-03-04 ENCOUNTER — Encounter (HOSPITAL_COMMUNITY): Payer: Self-pay | Admitting: Internal Medicine

## 2022-03-04 DIAGNOSIS — E440 Moderate protein-calorie malnutrition: Secondary | ICD-10-CM

## 2022-03-04 DIAGNOSIS — I482 Chronic atrial fibrillation, unspecified: Secondary | ICD-10-CM | POA: Diagnosis not present

## 2022-03-04 DIAGNOSIS — R627 Adult failure to thrive: Secondary | ICD-10-CM

## 2022-03-04 DIAGNOSIS — Z7189 Other specified counseling: Secondary | ICD-10-CM

## 2022-03-04 DIAGNOSIS — Z515 Encounter for palliative care: Secondary | ICD-10-CM | POA: Diagnosis not present

## 2022-03-04 DIAGNOSIS — I5033 Acute on chronic diastolic (congestive) heart failure: Secondary | ICD-10-CM | POA: Diagnosis not present

## 2022-03-04 DIAGNOSIS — I509 Heart failure, unspecified: Secondary | ICD-10-CM

## 2022-03-04 DIAGNOSIS — Z66 Do not resuscitate: Secondary | ICD-10-CM

## 2022-03-04 DIAGNOSIS — I2721 Secondary pulmonary arterial hypertension: Secondary | ICD-10-CM

## 2022-03-04 LAB — BASIC METABOLIC PANEL
Anion gap: 8 (ref 5–15)
BUN: 45 mg/dL — ABNORMAL HIGH (ref 8–23)
CO2: 28 mmol/L (ref 22–32)
Calcium: 8.4 mg/dL — ABNORMAL LOW (ref 8.9–10.3)
Chloride: 102 mmol/L (ref 98–111)
Creatinine, Ser: 1.37 mg/dL — ABNORMAL HIGH (ref 0.61–1.24)
GFR, Estimated: 50 mL/min — ABNORMAL LOW (ref >60)
Glucose, Bld: 151 mg/dL — ABNORMAL HIGH (ref 70–99)
Potassium: 4 mmol/L (ref 3.5–5.1)
Sodium: 138 mmol/L (ref 135–145)

## 2022-03-04 LAB — GLUCOSE, CAPILLARY
Glucose-Capillary: 157 mg/dL — ABNORMAL HIGH (ref 70–99)
Glucose-Capillary: 143 mg/dL — ABNORMAL HIGH (ref 70–99)
Glucose-Capillary: 348 mg/dL — ABNORMAL HIGH (ref 70–99)
Glucose-Capillary: 257 mg/dL — ABNORMAL HIGH (ref 70–99)
Glucose-Capillary: 214 mg/dL — ABNORMAL HIGH (ref 70–99)

## 2022-03-04 MED ORDER — HYDROCERIN EX CREA
TOPICAL_CREAM | Freq: Two times a day (BID) | CUTANEOUS | Status: DC
  Administered 2022-03-05 – 2022-03-06 (×2): 1 via TOPICAL
  Filled 2022-03-04: qty 113

## 2022-03-04 MED ORDER — CETAPHIL MOISTURIZING EX LOTN
TOPICAL_LOTION | Freq: Every day | CUTANEOUS | Status: DC
  Filled 2022-03-04: qty 473

## 2022-03-04 MED ORDER — DIGOXIN 125 MCG PO TABS
0.1250 mg | ORAL_TABLET | Freq: Once | ORAL | Status: AC
  Administered 2022-03-04: 0.125 mg via ORAL
  Filled 2022-03-04: qty 1

## 2022-03-04 MED ORDER — DIGOXIN 125 MCG PO TABS
0.0625 mg | ORAL_TABLET | Freq: Three times a day (TID) | ORAL | Status: AC
  Administered 2022-03-04 (×2): 0.0625 mg via ORAL
  Filled 2022-03-04 (×2): qty 1

## 2022-03-04 NOTE — Progress Notes (Signed)
Heart Failure Nurse Navigator Progress Note ? ?PCP: Aaron Pepper, MD ?PCP-Cardiologist: [name] ?Admission Diagnosis: Acute on chronic diastolic heart failure and altered mental status.  ?Admitted from: Home via EMS ? ?Presentation:   ?[name] presented with generalized weakness, intermittent confusion, which became worse over the last week, patient was just discharged 2 weeks ago from rehab  where he was admitted for weakness and GI bleed. Per daughter patient hasn't been moving much and staying in bed, and has had persistent lower extremity edema, that his home caregiver has been using compression stockings to treat. On admission BNP 2.092.5, Troponin 26, EKG per report Atrial fibrillation, IV lasix given.  ? ?Per interview which patient interacted well in, states that he takes his medications as prescribed, doesn't do the cooking, eats little, however what he does, doesn't have salt in it, watches how much he drinks. Patient educated on the signs and symptoms of heart failure , when to call his doctor or come to the ER. He stated he doesn't drive, but his daughter brings him to his appointments, he knows he is going to a [facility] for rehab and hopefully a short time. Stated he was interested in coming to a [facility] appointment, which was made on 03/13/2022 @ 11 am, on a Wednesday, because that's when his daughter has off from work. Patient verbalized his understanding of the conversation.  ? ?ECHO/ LVEF: 55-60% ? ?Clinical Course: ? ?Past Medical History:  ?Diagnosis Date  ? Anemia   ? LOW PLATELETS OTHER DAY  PER PT  ? Anticoagulated on Coumadin 12/21/2012  ? Aortic stenosis   ? Arthritis   ? "left wrist; back sometimes" (01/21/2013)  ? Atrial fibrillation ([place])   ? [name], DR [name]  ? Bradycardia   ? Exertional shortness of breath   ? Heart murmur   ? "I've had it for years; runs in the family on daddy's side" (01/21/2013)  ? HTN (hypertension)   ? Hyperlipemia 12/21/2012  ? Prostate  cancer ([place])   ? "had 40 tx of radiation in 2009" (01/21/2013)  ? S/P aortic valve replacement with bioprosthetic valve 01/19/2013  ? Transcatheter Aortic Valve Replacement using 44m Sapien bioprosthetic tissue valve via transapical approach  ? Type II diabetes mellitus ([place]   ?  ? ?Social History  ? ?Socioeconomic History  ? Marital status: Married  ?  Spouse name: [name] [name] ? Number of children: 2  ? Years of education: Not on file  ? Highest education level: High school graduate  ?Occupational History  ? Occupation: Retired  ?Tobacco Use  ? Smoking status: Former  ?  Packs/day: 2.00  ?  Years: 22.00  ?  Pack years: 44.00  ?  Types: Cigarettes  ?  Quit date: 09/11/1976  ?  Years since quitting: 45.5  ? Smokeless tobacco: Never  ?Vaping Use  ? Vaping Use: Never used  ?Substance and Sexual Activity  ? Alcohol use: Not on file  ? Drug use: No  ? Sexual activity: Never  ?Other Topics Concern  ? Not on file  ?Social History Narrative  ? Not on file  ? ?Social Determinants of Health  ? ?Financial Resource Strain: Not on file  ?Food Insecurity: No Food Insecurity  ? Worried About RCharity fundraiserin the Last Year: Never true  ? Ran Out of Food in the Last Year: Never true  ?Transportation Needs: No Transportation Needs  ? Lack of Transportation (Medical): No  ? Lack of  Transportation (Non-Medical): No  ?Physical Activity: Not on file  ?Stress: Not on file  ?Social Connections: Not on file  ? ? ?High Risk Criteria for Readmission and/or Poor Patient Outcomes: ?Heart failure hospital admissions (last 6 months): 0  ?No Show rate: 2% ?Difficult social situation: No ?Demonstrates medication adherence: Yes ?Primary Language: English ?Literacy level: Reading, writing, and comprehension.  ? ?Barriers of Care:   ?Failure to Thrive/ weakness  ?Diet/ fluids requirements ? ? ? ? ?Considerations/Referrals:  ? ?Referral made to Heart Failure Pharmacist [name]:  ?Referral made to Heart Failure CSW/NCM TOC: No, pt discharge plan to  Rehab/ SNF. ?Referral made to Heart & Vascular TOC clinic: Yes, 03/13/2022 @ 11 am.  ? ?Items for Follow-up on DC/TOC: ?Optimize medications ?Diet/fluid requirements ?Palliative care involved ? ? ?[name], BSN, RN ?Heart Failure Nurse Navigator ?Secure Chat Only   ?

## 2022-03-04 NOTE — Plan of Care (Signed)

## 2022-03-04 NOTE — Consult Note (Signed)
? ?Palliative Care Consult Note  ?                                ?Date: 03/04/2022  ? ?Patient Name: [name]  ?DOB: [birth year]  MRN: [id number]  Age / Sex: 88 y.o., male  ?PCP: [name], MD ?Referring Physician: Domenic Polite, MD ? ?Reason for Consultation: Establishing goals of care ? ?HPI/Patient Profile: 88 y.o. male  with past medical history of CAD, AS s/p TAVR, chronic diastolic CHF, severe mitral regurgitation, severe PAH, paroxysmal atrial fibrillation, no longer on anticoagulation due to recurrent GI bleeds, chronic anemia was brought to the ED on account of weakness, confusion and failure to thrive. He was admitted on 03/01/2022 with acute on chronic diastolic CHF, right heart failure with severe MR/TR, severe PAH, history of TAVR, AKI/cardiorenal syndrome, chronic atrial fibrillation, and others. ? ?Since admission he has been diuresed.  Still with complaints of feeling like there is fluid on board.  PT and OT evaluated and recommended SNF for short-term rehab.  Generally not a treatment candidate for surgical repair of MR/TR, cardioversion for A-fib, anticoagulation for A-fib. ? ?PMT was consulted for goals of care conversations. ? ?Past Medical History:  ?Diagnosis Date  ? Anemia   ? LOW PLATELETS OTHER DAY  PER PT  ? Anticoagulated on Coumadin 12/21/2012  ? Aortic stenosis   ? Arthritis   ? "left wrist; back sometimes" (01/21/2013)  ? Atrial fibrillation ([place])   ? [name], DR Burt Knack  ? Bradycardia   ? Exertional shortness of breath   ? Heart murmur   ? "I've had it for years; runs in the family on daddy's side" (01/21/2013)  ? HTN (hypertension)   ? Hyperlipemia 12/21/2012  ? Prostate cancer ([place])   ? "had 40 tx of radiation in 2009" (01/21/2013)  ? S/P aortic valve replacement with bioprosthetic valve 01/19/2013  ? Transcatheter Aortic Valve Replacement using 24m Sapien bioprosthetic tissue valve via transapical approach  ? Type II diabetes  mellitus ([place]   ? ? ?Subjective:  ? ?This NP EWalden Fieldreviewed medical records, received report from team, assessed the patient and then meet at the patient's bedside to discuss diagnosis, prognosis, GOC, EOL wishes disposition and options. ? ?I met with the patient and his daughter SVenida Jarvisand DSimona Huhat the bedside. ?  ?Concept of Palliative Care was introduced as specialized medical care for people and their families living with serious illness.  If focuses on providing relief from the symptoms and stress of a serious illness.  The goal is to improve quality of life for both the patient and the family. Values and goals of care important to patient and family were attempted to be elicited. ? ?Created space and opportunity for patient  and family to explore thoughts and feelings regarding current medical situation ?  ?Natural trajectory and current clinical status were discussed. Questions and concerns addressed. Patient  encouraged to call with questions or concerns.   ? ?Patient/Family Understanding of Illness: ?The patient is not aware of much going on.  The daughter says that she was called by the hospitalist who told her that her his valves are leaking bad and not much they can do about it.  The patient initially felt that this is not true which open the door for clarification discussions.  We have an extensive discussion on his current clinical situation including severe MR/TR, severe PAH, A-fib not  of which are significant treatment options for.  We discussed that they are using diuretics to keep the fluid down but his kidneys are mildly impacted.  If he has worsening kidney function they might have to back off on the diuretics which would pain is into a difficult situation.  He seemed to understand this situation, as his daughter did.  He states "so I am not in a good spot."  Both the patient and his daughter were tearful at these discussions. ? ?Life Review: ?Deferred ? ?Patient Values: ?Quality,  family ? ?Goals: ?Being able to go home ? ?Today's Discussion: ?In addition to discussion about his significant illnesses, both chronic and acute, we had further extensive discussion.  We discussed goals of care, options for treatment ranging from aggressive to full comfort.  We spent a specific amount of time discussing CODE STATUS.  Initially the patient wanted full code.  I informed him that it is completely his decision but I wanted to make sure he understood what this meant.  We described the statistical likelihood of a good outcome given multiple chronic illnesses and his already very weak heart.  In this situation it would be causing discomfort to do full code with not much expected benefit.  After this discussion he asked his daughter's opinion who felt he should be a DNR, and he agreed.  He became tearful again about this.  He seems to be struggling about acceptance that he is approaching end-of-life. ? ?We discussed plan for discharge to skilled nursing facility for short-term rehab.  His daughter states that he is not happy about this and wants to go home.  However, she promised him that it would be a short stay and she would push for him to come home as soon as he could.  He seems to be more agreeable at this point.  I recommended outpatient palliative care follow-up to continue to track his illness so that whether he improves, stabilizes, or declines somebody will be there to help support him during these transitions.  They have agreed to this. ? ?I provided emotional general support through therapeutic listening, empathy, sharing of stories, and other techniques.  Answered all questions and addressed all concerns to the best my ability. ? ?Review of Systems  ?Constitutional:   ?     Denies pain in general  ?Respiratory:  Negative for shortness of breath (Breathing improved compared to yesterday).   ?Cardiovascular:  Negative for chest pain.  ?Gastrointestinal:  Negative for abdominal pain, nausea and  vomiting.  ? ?Objective:  ? ?Primary Diagnoses: ?Present on Admission: ? Heart failure with preserved ejection fraction ([place]) ? Unspecified protein-calorie malnutrition ([place]) ? HYPERTENSION, CONTROLLED ? Prostate cancer ([place]) ? Essential hypertension ? Chronic atrial fibrillation with RVR (HCC) ? Elevated troponin ? SIRS (systemic inflammatory response syndrome) (HCC) ? Acute metabolic encephalopathy ? ? ?Physical Exam ?Vitals and nursing note reviewed.  ?Constitutional:   ?   General: He is sleeping. He is not in acute distress. ?HENT:  ?   Head: Normocephalic and atraumatic.  ?Cardiovascular:  ?   Rate and Rhythm: Rhythm irregular.  ?   Heart sounds: Murmur heard.  ?Systolic murmur is present with a grade of 5/6.  ?Pulmonary:  ?   Effort: Pulmonary effort is normal. No respiratory distress.  ?Abdominal:  ?   General: Abdomen is protuberant.  ?   Palpations: Abdomen is soft.  ?Skin: ?   General: Skin is warm and dry.  ?Neurological:  ?  General: No focal deficit present.  ?   Mental Status: He is easily aroused.  ?Psychiatric:     ?   Mood and Affect: Mood normal. Affect is tearful.     ?   Behavior: Behavior normal.  ? ? ?Vital Signs:  ?BP 118/60   Pulse 96   Temp 97.6 ?F (36.4 ?C) (Oral)   Resp 16   Ht _0  (1.727 m)   Wt 64.7 kg   SpO2 99%   BMI 21.69 kg/m?  ? ?Palliative Assessment/Data: 40% ? ? ? ?Advanced Care Planning:  ? ?Primary Decision Maker: ?PATIENT ? ?Code Status/Advance Care Planning: ?DNR ? ?A discussion was had today regarding advanced directives. Concepts specific to code status, artifical feeding and hydration, continued IV antibiotics and rehospitalization was had.  The difference between a aggressive medical intervention path and a palliative comfort care path for this patient at this time was had.  ? ?The patient states if he is not able to make his decisions that he would elect his daughter Patsi Sears to be his surrogate decision maker. ? ?Decisions/Changes to ACP: ?Changed  to DNR ? ?Assessment & Plan:  ? ?Impression: ?88 year old male with significant heart disease including severe MR TR not amenable to treatment options, A-fib not amenable to treatment options, severe PAH not amenable to

## 2022-03-04 NOTE — Care Management Important Message (Signed)
Important Message ? ?Patient Details  ?Name: [name] ?MRN: [id number] ?Date of Birth: [birth year] ? ? ?Medicare Important Message Given:  Yes ? ? ? ? ?[name] ?03/04/2022, 9:08 AM ?

## 2022-03-04 NOTE — Progress Notes (Signed)
Mobility Specialist Progress Note  ? ? 03/04/22 1557  ?Mobility  ?Activity Ambulated with assistance to bathroom  ?Level of Assistance Minimal assist, patient does 75% or more  ?Assistive Device Front wheel walker  ?Distance Ambulated (ft) 10 ft  ?Activity Response Tolerated well  ?$Mobility charge 1 Mobility  ? ?Pt received in bed declining ambulation but wanting to attempt a BM. No complaints. Encouraged pt to pull string when ready to get up. NT aware.  ? ?Hildred Alamin ?Mobility Specialist  ?Primary: 5N M.S. Phone: [phone] ?Secondary: 6N M.S. Phone: [phone] ?  ?

## 2022-03-04 NOTE — Progress Notes (Signed)
?PROGRESS NOTE ? ? ? ?[name]  YPP:[id number] DOB: 08-Apr-1933 DOA: 03/01/2022 ?PCP: [name], MD  ?88 y.o. male with history of CAD, AS s/p TAVR, chronic diastolic CHF, severe mitral regurgitation, severe PAH, paroxysmal atrial fibrillation, no longer on anticoagulation due to recurrent GI bleeds, chronic anemia was brought to the ED on account of weakness, confusion and failure to thrive. ?-Also recently had increased swelling, torsemide dose was increased 10 days ago, also having intermittent hallucinations, just completed course of antibiotics for UTI 2 days prior to admission ?-In the emergency room he was afebrile, labs noted creatinine of 1.46, BNP 2092, chest x-ray noted small right pleural effusion and cardiomegaly ?-Started on IV Lasix ? ?Subjective: ?-Feels better overall, breathing continues to improve ? ?Assessment and Plan: ? ?Acute on chronic diastolic CHF  ?Right heart failure, severe MR, severe TR  ?Severe PAH ?History of TAVR ?-Diuresing on IV Lasix, he is 4.3 L negative, weight down 8 kg(18 lb) ?-Cardiology following, due to advanced age and frailty he would not be considered a candidate for transcatheter mitral valve repair ?-Continue metoprolol, may need DCCV for A-fib ?-Farxiga resumed, continue IV Lasix today ?-Overall prognosis is guarded, will request palliative care consultation ?-PT OT eval completed, SNF recommended for short-term rehab ? ?Mild lactic acidosis ?-Likely secondary to right heart failure, clinically do not suspect acute infectious process at this time ?-Lactic acidosis has resolved, he is afebrile without leukocytosis, urinalysis and chest x-ray unremarkable ?-Stable off antibiotics ?  ?Acute metabolic encephalopathy ?-Likely delirium or sundowning ?-Recent history of hallucinations, he is awake and alert, oriented x3   with mild cognitive deficits only, MRI brain was unremarkable, infectious work-up also unrevealing ?-TSH is normal ?  ?Acute kidney injury ?-Mild  AKI, likely cardiorenal, monitor diuretics ?  ?Chronic atrial fibrillation ?-Heart rate improving, continue metoprolol , no longer on anticoagulation due to recurrent GI bleed  ?  ?Essential hypertension ?-BP soft now, continue metoprolol ? ?Anemia of chronic disease ?-Hemoglobin stable ?  ?Diabetes mellitus type 2, well controlled ?- Last hemoglobin A1c was 5.4 on 12/15/2021. ?-CBGs elevated, increase Levemir dose ?  ?Prostate cancer ?-Continue Casodex ?  ?Moderate protein calorie malnutrition ?Albumin 3.1 ? ?Chronic leg wounds ?-Routine wound care ?  ?Advance Care Planning:   Code Status: Full Code  ?  ?Consults: Cardiology ?  ?Family Communication: No family at bedside, called and updated daughter ?Disposition: SNF likely 48 hours ? ?Procedures:  ? ?Antimicrobials:  ? ? ?Objective: ?Vitals:  ? 03/04/22 1000 03/04/22 1010 03/04/22 1018 03/04/22 1044  ?BP: 118/60 118/60    ?Pulse:  92 96   ?Resp: 17   16  ?Temp:      ?TempSrc:      ?SpO2:    99%  ?Weight:      ?Height:      ? ? ?Intake/Output Summary (Last 24 hours) at 03/04/2022 1352 ?Last data filed at 03/04/2022 0909 ?Gross per 24 hour  ?Intake 720 ml  ?Output 900 ml  ?Net -180 ml  ? ?Filed Weights  ? 03/02/22 0455 03/03/22 0019 03/04/22 0304  ?Weight: 62.8 kg 64.2 kg 64.7 kg  ? ? ?Examination: ? ?General exam: Pleasant chronically ill elderly male sitting up in bed, AAOx3, no distress, mild cognitive deficits ?CVS: S1-S2, regular rhythm, systolic murmur ?Lungs: Few basilar rales, otherwise clear ?Abdomen: Firm, mildly distended, nontender, bowel sounds present  ?Extremities: No edema, chronic venous stasis and scaling in both legs, superficial wounds with Tegaderm ?Psychiatry:  Mood &  affect appropriate.  ? ? ? ?Data Reviewed:  ? ?CBC: ?Recent Labs  ?Lab 03/01/22 ?3151 03/02/22 ?0336 03/03/22 ?0245  ?WBC 8.7 6.6 7.0  ?NEUTROABS 7.2  --   --   ?HGB 10.6* 10.8* 10.3*  ?HCT 33.5* 34.5* 31.2*  ?MCV 105.3* 105.5* 103.7*  ?PLT 186 199 196  ? ?Basic Metabolic  Panel: ?Recent Labs  ?Lab 03/01/22 ?7616 03/02/22 ?0336 03/03/22 ?0737 03/04/22 ?0204  ?NA 142 141 137 138  ?K 4.0 3.8 4.0 4.0  ?CL 99 99 99 102  ?CO2 '30 31 30 28  '$ ?GLUCOSE 219* 305* 254* 151*  ?BUN 34* 37* 42* 45*  ?CREATININE 1.46* 1.37* 1.23 1.37*  ?CALCIUM 9.0 8.9 8.4* 8.4*  ? ?GFR: ?Estimated Creatinine Clearance: 34.8 mL/min (A) (by C-G formula based on SCr of 1.37 mg/dL (H)). ?Liver Function Tests: ?Recent Labs  ?Lab 03/01/22 ?0847  ?AST 26  ?ALT 17  ?ALKPHOS 76  ?BILITOT 1.4*  ?PROT 6.0*  ?ALBUMIN 3.1*  ? ?No results for input(s): LIPASE, AMYLASE in the last 168 hours. ?No results for input(s): AMMONIA in the last 168 hours. ?Coagulation Profile: ?No results for input(s): INR, PROTIME in the last 168 hours. ?Cardiac Enzymes: ?No results for input(s): CKTOTAL, CKMB, CKMBINDEX, TROPONINI in the last 168 hours. ?BNP (last 3 results) ?Recent Labs  ?  12/05/21 ?0915  ?PROBNP 6,343*  ? ?HbA1C: ?No results for input(s): HGBA1C in the last 72 hours. ?CBG: ?Recent Labs  ?Lab 03/03/22 ?1601 03/03/22 ?2104 03/04/22 ?0600 03/04/22 ?0755 03/04/22 ?1117  ?GLUCAP 185* 259* 157* 143* 348*  ? ?Lipid Profile: ?No results for input(s): CHOL, HDL, LDLCALC, TRIG, CHOLHDL, LDLDIRECT in the last 72 hours. ?Thyroid Function Tests: ?Recent Labs  ?  03/01/22 ?1413  ?TSH 1.200  ? ?Anemia Panel: ?No results for input(s): VITAMINB12, FOLATE, FERRITIN, TIBC, IRON, RETICCTPCT in the last 72 hours. ?Urine analysis: ?   ?Component Value Date/Time  ? COLORURINE YELLOW 03/01/2022 1300  ? APPEARANCEUR CLEAR 03/01/2022 1300  ? LABSPEC 1.012 03/01/2022 1300  ? LABSPEC 1.010 02/05/2006 1006  ? PHURINE 6.0 03/01/2022 1300  ? GLUCOSEU >=500 (A) 03/01/2022 1300  ? HGBUR NEGATIVE 03/01/2022 1300  ? [place] NEGATIVE 03/01/2022 1300  ? BILIRUBINUR Negative 02/05/2006 1006  ? [place] NEGATIVE 03/01/2022 1300  ? PROTEINUR NEGATIVE 03/01/2022 1300  ? UROBILINOGEN 2.0 (H) 01/15/2013 1411  ? NITRITE NEGATIVE 03/01/2022 1300  ? LEUKOCYTESUR NEGATIVE  03/01/2022 1300  ? LEUKOCYTESUR Negative 02/05/2006 1006  ? ?Sepsis Labs: ?'@LABRCNTIP'$ (procalcitonin:4,lacticidven:4) ? ?) ?Recent Results (from the past 240 hour(s))  ?Urine Culture     Status: Abnormal  ? Collection Time: 03/01/22  1:00 PM  ? Specimen: Urine, Clean Catch  ?Result Value Ref Range Status  ? Specimen Description URINE, CLEAN CATCH  Final  ? Special Requests NONE  Final  ? Culture (A)  Final  ?  <10,000 COLONIES/mL INSIGNIFICANT GROWTH ?Performed at [facility], [place] [address] [place], [place] [address] ?  ? Report Status 03/02/2022 FINAL  Final  ?Culture, blood (Routine X 2) w Reflex to ID Panel     Status: None (Preliminary result)  ? Collection Time: 03/01/22  6:42 PM  ? Specimen: BLOOD  ?Result Value Ref Range Status  ? Specimen Description BLOOD RIGHT ANTECUBITAL  Final  ? Special Requests   Final  ?  BOTTLES DRAWN AEROBIC AND ANAEROBIC Blood Culture adequate volume  ? Culture   Final  ?  NO GROWTH 3 DAYS ?Performed at [facility], [place]  [address], [place], [place] [address] ?  ? Report Status PENDING  Incomplete  ?Culture, blood (Routine X 2) w Reflex to ID Panel     Status: None (Preliminary result)  ? Collection Time: 03/01/22  6:42 PM  ? Specimen: BLOOD  ?Result Value Ref Range Status  ? Specimen Description BLOOD LEFT ANTECUBITAL  Final  ? Special Requests   Final  ?  BOTTLES DRAWN AEROBIC AND ANAEROBIC Blood Culture adequate volume  ? Culture   Final  ?  NO GROWTH 3 DAYS ?Performed at [facility], [place] [address], [place], [place] [address] ?  ? Report Status PENDING  Incomplete  ?  ? ?Radiology Studies: ?No results found. ? ? ?Scheduled Meds: ? aspirin EC  81 mg Oral Daily  ? cetaphil   Topical Daily  ? dapagliflozin propanediol  10 mg Oral Daily  ? digoxin  0.0625 mg Oral Q8H  ? enoxaparin (LOVENOX) injection  40 mg Subcutaneous Q24H  ? feeding supplement (GLUCERNA SHAKE)  237 mL Oral TID BM  ? fluticasone  1 spray Each Nare Daily  ? furosemide  20 mg Intravenous BID  ?  gabapentin  100 mg Oral QHS  ? hydrocerin   Topical BID  ? insulin aspart  0-5 Units Subcutaneous QHS  ? insulin aspart  0-9 Units Subcutaneous TID WC  ? insulin detemir  25 Units Subcutaneous Daily  ? loratadine

## 2022-03-04 NOTE — Progress Notes (Signed)
? ?Progress Note ? ?Patient Name: [name] ?Date of Encounter: 03/04/2022 ? ?[facility] Cardiologist: [name], MD  ? ?Subjective  ? ?Feeling OK.  Thinks that he still has some fluid in his lungs ? ?Inpatient Medications  ?  ?Scheduled Meds: ? aspirin EC  81 mg Oral Daily  ? dapagliflozin propanediol  10 mg Oral Daily  ? enoxaparin (LOVENOX) injection  40 mg Subcutaneous Q24H  ? feeding supplement  237 mL Oral BID BM  ? feeding supplement (GLUCERNA SHAKE)  237 mL Oral TID BM  ? fluticasone  1 spray Each Nare Daily  ? furosemide  20 mg Intravenous BID  ? gabapentin  100 mg Oral QHS  ? hydrocerin   Topical BID  ? insulin aspart  0-5 Units Subcutaneous QHS  ? insulin aspart  0-9 Units Subcutaneous TID WC  ? insulin detemir  25 Units Subcutaneous Daily  ? loratadine  10 mg Oral Daily  ? mouth rinse  15 mL Mouth Rinse BID  ? metoprolol succinate  12.5 mg Oral Daily  ? multivitamin with minerals  1 tablet Oral Daily  ? pantoprazole  40 mg Oral Daily  ? rosuvastatin  5 mg Oral QHS  ? sodium chloride flush  3 mL Intravenous Q12H  ? ?Continuous Infusions: ? sodium chloride    ? ?PRN Meds: ?sodium chloride, acetaminophen, ondansetron (ZOFRAN) IV, sodium chloride flush  ? ?Vital Signs  ?  ?Vitals:  ? 03/03/22 2130 03/04/22 0301 03/04/22 0304 03/04/22 0800  ?BP: 97/66 (!) 113/58  111/67  ?Pulse: 66 91  88  ?Resp: (!) _0 ?Temp: 97.6 ?F (36.4 ?C) 97.9 ?F (36.6 ?C)  97.6 ?F (36.4 ?C)  ?TempSrc: Oral Oral  Oral  ?SpO2: 100% 98%  99%  ?Weight:   64.7 kg   ?Height:      ? ? ?Intake/Output Summary (Last 24 hours) at 03/04/2022 0926 ?Last data filed at 03/04/2022 0909 ?Gross per 24 hour  ?Intake 840 ml  ?Output 900 ml  ?Net -60 ml  ? ? ?  03/04/2022  ?  3:04 AM 03/03/2022  ? 12:19 AM 03/02/2022  ?  4:55 AM  ?Last 3 Weights  ?Weight (lbs) 142 lb 10.2 oz 141 lb 8.6 oz 138 lb 7.2 oz  ?Weight (kg) 64.7 kg 64.2 kg 62.8 kg  ?   ? ?Telemetry  ?  ?Atrial fibrillation.  Rate 80-100s.  PVCs.  - Personally  Reviewed ? ?ECG  ?  ?N/a - Personally Reviewed ? ?Physical Exam  ? ?VS:  BP 111/67 (BP Location: Right Arm)   Pulse 88   Temp 97.6 ?F (36.4 ?C) (Oral)   Resp 20   Ht _1  (1.727 m)   Wt 64.7 kg   SpO2 99%   BMI 21.69 kg/m?  , BMI Body mass index is 21.69 kg/m?. ?GENERAL: Chronically ill-appearing.  Frail. ?HEENT: Pupils equal round and reactive, fundi not visualized, oral mucosa unremarkable ?NECK:  + jugular venous distention, waveform within normal limits, carotid upstroke brisk and symmetric, no bruits, no thyromegaly ?LUNGS: Diminished at R base ?HEART:  RRR.  PMI not displaced or sustained,S1 and S2 within normal limits, no S3, no S4, no clicks, no rubs, III/VI holosystolic murmur at apex ?ABD:  Flat, positive bowel sounds normal in frequency in pitch, no bruits, no rebound, no guarding, no midline pulsatile mass, no hepatomegaly, no splenomegaly ?EXT:  2 plus pulses throughout, 1+ woody edema, no cyanosis no clubbing ?SKIN:  Chronic  stasis dermatitis ?NEURO:  Cranial nerves II through XII grossly intact, motor grossly intact throughout ?PSYCH:  Cognitively intact, oriented to person place and time ? ? ?Labs  ?  ?High Sensitivity Troponin:   ?Recent Labs  ?Lab 03/01/22 ?4742 03/01/22 ?1300  ?TROPONINIHS 26* 31*  ?   ?Chemistry ?Recent Labs  ?Lab 03/01/22 ?5956 03/02/22 ?0336 03/03/22 ?3875 03/04/22 ?0204  ?NA 142 141 137 138  ?K 4.0 3.8 4.0 4.0  ?CL 99 99 99 102  ?CO2 _0 ?GLUCOSE 219* 305* 254* 151*  ?BUN 34* 37* 42* 45*  ?CREATININE 1.46* 1.37* 1.23 1.37*  ?CALCIUM 9.0 8.9 8.4* 8.4*  ?PROT 6.0*  --   --   --   ?ALBUMIN 3.1*  --   --   --   ?AST 26  --   --   --   ?ALT 17  --   --   --   ?ALKPHOS 76  --   --   --   ?BILITOT 1.4*  --   --   --   ?GFRNONAA 46* 50* 57* 50*  ?ANIONGAP _1 ?  ?Lipids No results for input(s): CHOL, TRIG, HDL, LABVLDL, LDLCALC, CHOLHDL in the last 168 hours.  ?Hematology ?Recent Labs  ?Lab 03/01/22 ?6433 03/02/22 ?0336 03/03/22 ?0245  ?WBC 8.7 6.6 7.0  ?RBC  3.18* 3.27* 3.01*  ?HGB 10.6* 10.8* 10.3*  ?HCT 33.5* 34.5* 31.2*  ?MCV 105.3* 105.5* 103.7*  ?MCH 33.3 33.0 34.2*  ?MCHC 31.6 31.3 33.0  ?RDW 16.5* 16.7* 16.4*  ?PLT 186 199 196  ? ?Thyroid  ?Recent Labs  ?Lab 03/01/22 ?1413  ?TSH 1.200  ?  ?BNP ?Recent Labs  ?Lab 03/01/22 ?0847  ?BNP 2,092.5*  ?  ?DDimer No results for input(s): DDIMER in the last 168 hours.  ? ?Radiology  ?  ?ECHOCARDIOGRAM COMPLETE ? ?Result Date: 03/02/2022 ?   ECHOCARDIOGRAM REPORT   Patient Name:   [name] Date of Exam: 03/02/2022 Medical Rec #:  [id number]         Height:       68.0 in Accession #:    [id number]        Weight:       138.4 lb Date of Birth:  December 26, 1932          BSA:          1.748 m? Patient Age:    88 years          BP:           87/61 mmHg Patient Gender: M                 HR:           96 bpm. Exam Location:  Inpatient Procedure: 2D Echo, Cardiac Doppler and Color Doppler Indications:    Dyspnea  History:        Patient has prior history of Echocardiogram examinations, most                 recent 11/21/2016. Pulmonary HTN, Aortic Valve Disease and Mitral                 Valve Disease; Risk Factors:Hypertension.  Sonographer:    Merrie Roof RDCS Referring Phys: [place]  1. Left ventricular ejection fraction, by estimation, is 55 to 60%. The left ventricle has normal function. The left ventricle has no regional wall motion abnormalities. Left ventricular diastolic parameters are  indeterminate.  2. Right ventricular systolic function is moderately reduced. The right ventricular size is mildly enlarged. There is severely elevated pulmonary artery systolic pressure.  3. Left atrial size was severely dilated.  4. Right atrial size was severely dilated.  5. ERO > .4 similar to prior echo and PISA radius 1 MAC with leaflet thickening and restricted posterior leaflet . The mitral valve is normal in structure. Severe mitral valve regurgitation. No evidence of mitral stenosis.  6. Tricuspid valve  regurgitation is severe.  7. Post TAVR with 26 mm Sapien 3 mild PVL seen best on SA views at 7:00 mean gradient 6 mmHg lower than TTE 11/21/21 . The aortic valve has been repaired/replaced. Aortic valve regurgitation is not visualized. No aortic stenosis is present.  8. The inferior vena cava is dilated in size with >50% respiratory variability, suggesting right atrial pressure of 8 mmHg. FINDINGS  Left Ventricle: Left ventricular ejection fraction, by estimation, is 55 to 60%. The left ventricle has normal function. The left ventricle has no regional wall motion abnormalities. The left ventricular internal cavity size was normal in size. There is  no left ventricular hypertrophy. Left ventricular diastolic parameters are indeterminate. Right Ventricle: The right ventricular size is mildly enlarged. No increase in right ventricular wall thickness. Right ventricular systolic function is moderately reduced. There is severely elevated pulmonary artery systolic pressure. The tricuspid regurgitant velocity is 3.69 m/s, and with an assumed right atrial pressure of 15 mmHg, the estimated right ventricular systolic pressure is 30.1 mmHg. Left Atrium: Left atrial size was severely dilated. Right Atrium: Right atrial size was severely dilated. Pericardium: There is no evidence of pericardial effusion. Mitral Valve: ERO > .4 similar to prior echo and PISA radius 1 MAC with leaflet thickening and restricted posterior leaflet. The mitral valve is normal in structure. Severe mitral valve regurgitation. No evidence of mitral valve stenosis. Tricuspid Valve: The tricuspid valve is normal in structure. Tricuspid valve regurgitation is severe. No evidence of tricuspid stenosis. Aortic Valve: Post TAVR with 26 mm Sapien 3 mild PVL seen best on SA views at 7:00 mean gradient 6 mmHg lower than TTE 11/21/21. The aortic valve has been repaired/replaced. Aortic valve regurgitation is not visualized. No aortic stenosis is present. Aortic  valve mean gradient measures 6.0 mmHg. Aortic valve peak gradient measures 13.0 mmHg. Aortic valve area, by VTI measures 1.58 cm?. Pulmonic Valve: The pulmonic valve was normal in structure. Pulmonic valve regurgitation is mild. No evid

## 2022-03-05 DIAGNOSIS — I38 Endocarditis, valve unspecified: Secondary | ICD-10-CM

## 2022-03-05 DIAGNOSIS — I5033 Acute on chronic diastolic (congestive) heart failure: Secondary | ICD-10-CM | POA: Diagnosis not present

## 2022-03-05 LAB — GLUCOSE, CAPILLARY
Glucose-Capillary: 92 mg/dL (ref 70–99)
Glucose-Capillary: 225 mg/dL — ABNORMAL HIGH (ref 70–99)
Glucose-Capillary: 206 mg/dL — ABNORMAL HIGH (ref 70–99)
Glucose-Capillary: 189 mg/dL — ABNORMAL HIGH (ref 70–99)

## 2022-03-05 LAB — BASIC METABOLIC PANEL
Anion gap: 7 (ref 5–15)
BUN: 50 mg/dL — ABNORMAL HIGH (ref 8–23)
CO2: 29 mmol/L (ref 22–32)
Calcium: 8.6 mg/dL — ABNORMAL LOW (ref 8.9–10.3)
Chloride: 99 mmol/L (ref 98–111)
Creatinine, Ser: 1.25 mg/dL — ABNORMAL HIGH (ref 0.61–1.24)
GFR, Estimated: 56 mL/min — ABNORMAL LOW (ref >60)
Glucose, Bld: 88 mg/dL (ref 70–99)
Potassium: 3.9 mmol/L (ref 3.5–5.1)
Sodium: 135 mmol/L (ref 135–145)

## 2022-03-05 MED ORDER — INSULIN ASPART 100 UNIT/ML IJ SOLN
2.0000 [IU] | Freq: Three times a day (TID) | INTRAMUSCULAR | Status: DC
  Administered 2022-03-05 – 2022-03-07 (×5): 2 [IU] via SUBCUTANEOUS

## 2022-03-05 MED ORDER — DIGOXIN 125 MCG PO TABS
0.1250 mg | ORAL_TABLET | Freq: Every day | ORAL | Status: DC
  Administered 2022-03-05 – 2022-03-07 (×3): 0.125 mg via ORAL
  Filled 2022-03-05 (×3): qty 1

## 2022-03-05 MED ORDER — FUROSEMIDE 40 MG PO TABS
40.0000 mg | ORAL_TABLET | Freq: Two times a day (BID) | ORAL | Status: DC
  Administered 2022-03-05 – 2022-03-07 (×4): 40 mg via ORAL
  Filled 2022-03-05 (×4): qty 1

## 2022-03-05 NOTE — Progress Notes (Signed)
Inpatient Diabetes Program Recommendations ? ?AACE/ADA: New Consensus Statement on Inpatient Glycemic Control (2015) ? ?Target Ranges:  Prepandial:   less than 140 mg/dL ?     Peak postprandial:   less than 180 mg/dL (1-2 hours) ?     Critically ill patients:  140 - 180 mg/dL  ? ?Lab Results  ?Component Value Date  ? GLUCAP 92 03/05/2022  ? HGBA1C 5.4 12/15/2021  ? ? ?Review of Glycemic Control ? Latest Reference Range & Units 03/04/22 06:00 03/04/22 07:55 03/04/22 11:17 03/04/22 16:11 03/04/22 22:02 03/05/22 06:43  ?Glucose-Capillary 70 - 99 mg/dL 157 (H) 143 (H) 348 (H) 257 (H) 214 (H) 92  ?(H): Data is abnormally high ? ?Diabetes history: DM2 ?Outpatient Diabetes medications: Farxiga 10, Met 1 gm bid ?Current orders for Inpatient glycemic control: Lev 25 qd, Farxiga 10, 0-9 tid, 0-5 ? ?Inpatient Diabetes Program Recommendations:   ?Postprandial CBGs elevated. ?-Add Novolog 2-3 units tid meal coverage if eats 50% if appropriate ?Secure chat sent to Dr. Broadus John. ? ?Thank you, ?Nani Gasser Shenna Brissette, RN, MSN, CDE  ?Diabetes Coordinator ?Inpatient Glycemic Control Team ?Team Pager [phone] (8am-5pm) ?03/05/2022 10:48 AM ? ? ? ? ?

## 2022-03-05 NOTE — Progress Notes (Signed)
Mobility Specialist Progress Note: ? ? 03/05/22 1040  ?Mobility  ?Activity Ambulated with assistance to bathroom  ?Level of Assistance Moderate assist, patient does 50-74%  ?Assistive Device Front wheel walker  ?Distance Ambulated (ft) 15 ft  ?Activity Response Tolerated well  ?$Mobility charge 1 Mobility  ? ?Pt asx during ambulation back to chair. Required modA to stand from low toilet. Pt left in chair with all needs met.  ? ?Nelta Numbers ?Acute Rehab ?Phone: [phone] ?Office Phone: [phone] ? ?

## 2022-03-05 NOTE — Progress Notes (Signed)
Pt. Was seen for skilled OT to mazimize I and safety with ADLs and mobility. Pt. Was ed on energy conservation for ADLs with use of AE and AD. Pt. Was receptive to education. Acute OT to follow.  ? 03/05/22 1100  ?OT Visit Information  ?Last OT Received On 03/05/22  ?Assistance Needed +1  ?History of Present Illness 88 y.o. M adm 4/28 with sepsis, CHF exacerbation and encephalopathy. PMhx: HFpEF, AS s/p TAVR, CAD, moderate mitral regurgitation, type 2 diabetes, HTN, persistent Afib, PAD, CKD 3b, GIB, prostate CA.  ?Precautions  ?Precautions Fall  ?Restrictions  ?Weight Bearing Restrictions No  ?Pain Assessment  ?Pain Assessment 0-10  ?Pain Score 5  ?Pain Location bottom  ?Pain Descriptors / Indicators Aching;Sore  ?Pain Intervention(s)  ?(Pt. does not want pain medicine)  ?Cognition  ?Arousal/Alertness Awake/alert  ?Behavior During Therapy [facility] for tasks assessed/performed  ?Overall Cognitive Status Within Functional Limits for tasks assessed  ?Upper Extremity Assessment  ?Upper Extremity Assessment RUE deficits/detail;LUE deficits/detail  ?RUE Deficits / Details shld flex 10 and abd 10 degrees  ?LUE Deficits / Details shld flex 0 and abd 10  ?Vision- Assessment  ?Vision Assessment? No apparent visual deficits  ?ADL  ?Overall ADL's  Needs assistance/impaired  ?Eating/Feeding Set up;Sitting  ?Grooming Supervision/safety;Standing  ?Grooming Details (indicate cue type and reason) stood at sink x3 mins  ?Upper Body Bathing Set up;Sitting  ?Lower Body Bathing Maximal assistance;Sitting/lateral leans;Sit to/from stand;Cueing for safety  ?Upper Body Dressing  Set up;Sitting  ?Lower Body Dressing Maximal assistance;Sitting/lateral leans;Sit to/from stand  ?Armed forces technical officer Min guard  ?Toileting- Clothing Manipulation and Hygiene Maximal assistance  ?Functional mobility during ADLs Rolling walker (2 wheels);Min guard  ?General ADL Comments pt. ed on energy conservation with adls.  ?Transfers  ?Sit to Stand Min guard  ?Balance   ?Sitting balance-Leahy Scale Good  ?Standing balance-Leahy Scale Fair  ?OT - End of Session  ?Equipment Utilized During [employer] walker (2 wheels)  ?Activity Tolerance Patient tolerated treatment well  ?Patient left in chair;with call bell/phone within reach;with chair alarm set  ?Nurse Communication  ?(ok therapy)  ?OT Assessment/Plan  ?OT Plan Discharge plan remains appropriate  ?OT Visit Diagnosis Unsteadiness on feet (R26.81);Muscle weakness (generalized) (M62.81);Pain  ?OT Frequency (ACUTE ONLY) Min 2X/week  ?Follow Up Recommendations Skilled nursing-short term rehab (<3 hours/day)  ?Assistance recommended at discharge Frequent or constant Supervision/Assistance  ?Patient can return home with the following A little help with walking and/or transfers;A lot of help with bathing/dressing/bathroom;Assistance with cooking/housework;Assist for transportation;Help with stairs or ramp for entrance  ?OT Equipment None recommended by OT  ?AM-PAC OT "6 Clicks" Daily Activity Outcome Measure (Version 2)  ?Help from another person eating meals? 4  ?Help from another person taking care of personal grooming? 3  ?Help from another person toileting, which includes using toliet, bedpan, or urinal? 2  ?Help from another person bathing (including washing, rinsing, drying)? 2  ?Help from another person to put on and taking off regular upper body clothing? 3  ?Help from another person to put on and taking off regular lower body clothing? 2  ?6 Click Score 16  ?Progressive Mobility  ?What is the highest level of mobility based on the progressive mobility assessment? Level 4 (Walks with assist in room) - Balance while marching in place and cannot step forward and back - Complete  ?Activity Ambulated with assistance in room  ?OT Goal Progression  ?Progress towards OT goals Progressing toward goals  ?Acute Rehab OT Goals  ?Patient Stated  Goal to get stronger  ?OT Goal Formulation With patient/family  ?Time For Goal Achievement  03/16/22  ?Potential to Achieve Goals Good  ?ADL Goals  ?Pt Will Perform Grooming with set-up;standing  ?Additional ADL Goal #1 Pt will stand x5 mins for ADL tasks OOB with supervisionA with minimal resting on elbows to increase activity tolerance.  ?Additional ADL Goal #2 Pt will state 3 energy conservation techniques to increase carry over of skills to increase endurance for ADL/mobility.  ?OT Time Calculation  ?OT Start Time (ACUTE ONLY) 1102  ?OT Stop Time (ACUTE ONLY) 1142  ?OT Time Calculation (min) 40 min  ?OT General Charges  ?$OT Visit 1 Visit  ?OT Treatments  ?$Self Care/Home Management  8-22 mins  ?$Therapeutic Activity 23-37 mins  ?Reece Packer OT/L ? ?

## 2022-03-05 NOTE — Progress Notes (Signed)
? ?Progress Note ? ?Patient Name: [name] ?Date of Encounter: 03/05/2022 ? ?[facility] Cardiologist: [name], MD  ? ?Subjective  ? ?Feeling OK.  Breathing stable.  SOB with having bowel movement.  He has discomfort on his buttocks from radiation. ? ?Inpatient Medications  ?  ?Scheduled Meds: ? aspirin EC  81 mg Oral Daily  ? cetaphil   Topical Daily  ? dapagliflozin propanediol  10 mg Oral Daily  ? enoxaparin (LOVENOX) injection  40 mg Subcutaneous Q24H  ? feeding supplement (GLUCERNA SHAKE)  237 mL Oral TID BM  ? fluticasone  1 spray Each Nare Daily  ? furosemide  20 mg Intravenous BID  ? gabapentin  100 mg Oral QHS  ? hydrocerin   Topical BID  ? insulin aspart  0-5 Units Subcutaneous QHS  ? insulin aspart  0-9 Units Subcutaneous TID WC  ? insulin detemir  25 Units Subcutaneous Daily  ? loratadine  10 mg Oral Daily  ? mouth rinse  15 mL Mouth Rinse BID  ? metoprolol succinate  12.5 mg Oral Daily  ? multivitamin with minerals  1 tablet Oral Daily  ? pantoprazole  40 mg Oral Daily  ? rosuvastatin  5 mg Oral QHS  ? sodium chloride flush  3 mL Intravenous Q12H  ? ?Continuous Infusions: ? sodium chloride    ? ?PRN Meds: ?sodium chloride, acetaminophen, ondansetron (ZOFRAN) IV, sodium chloride flush  ? ?Vital Signs  ?  ?Vitals:  ? 03/04/22 2135 03/04/22 2205 03/05/22 0116 03/05/22 0636  ?BP:  (!) 115/53 (!) 116/54 121/80  ?Pulse: 74 98 97 92  ?Resp: (!) 22 20 (!) 22 16  ?Temp:   98.2 ?F (36.8 ?C) 98 ?F (36.7 ?C)  ?TempSrc:   Oral Oral  ?SpO2: 94% 98% 98% 96%  ?Weight:    64.5 kg  ?Height:      ? ? ?Intake/Output Summary (Last 24 hours) at 03/05/2022 1054 ?Last data filed at 03/05/2022 0706 ?Gross per 24 hour  ?Intake 597 ml  ?Output 1850 ml  ?Net -1253 ml  ? ? ?  03/05/2022  ?  6:36 AM 03/04/2022  ?  3:04 AM 03/03/2022  ? 12:19 AM  ?Last 3 Weights  ?Weight (lbs) 142 lb 3.2 oz 142 lb 10.2 oz 141 lb 8.6 oz  ?Weight (kg) 64.501 kg 64.7 kg 64.2 kg  ?   ? ?Telemetry  ?  ?Atrial fibrillation.  Rate 80-100s.   PVCs.  - Personally Reviewed ? ?ECG  ?  ?N/a - Personally Reviewed ? ?Physical Exam  ? ?VS:  BP 121/80 (BP Location: Right Arm)   Pulse 92   Temp 98 ?F (36.7 ?C) (Oral)   Resp 16   Ht _0  (1.727 m)   Wt 64.5 kg   SpO2 96%   BMI 21.62 kg/m?  , BMI Body mass index is 21.62 kg/m?. ?GENERAL: Chronically ill-appearing.  Frail. ?HEENT: Pupils equal round and reactive, fundi not visualized, oral mucosa unremarkable ?NECK:  + jugular venous distention, waveform within normal limits, carotid upstroke brisk and symmetric, no bruits, no thyromegaly ?LUNGS: Diminished at R base ?HEART:  RRR.  PMI not displaced or sustained,S1 and S2 within normal limits, no S3, no S4, no clicks, no rubs, III/VI holosystolic murmur at apex ?ABD:  Flat, positive bowel sounds normal in frequency in pitch, no bruits, no rebound, no guarding, no midline pulsatile mass, no hepatomegaly, no splenomegaly ?EXT:  2 plus pulses throughout, 1+ woody edema, no cyanosis no clubbing ?  SKIN:  Chronic stasis dermatitis ?NEURO:  Cranial nerves II through XII grossly intact, motor grossly intact throughout ?PSYCH:  Cognitively intact, oriented to person place and time ? ? ?Labs  ?  ?High Sensitivity Troponin:   ?Recent Labs  ?Lab 03/01/22 ?6063 03/01/22 ?1300  ?TROPONINIHS 26* 31*  ?   ?Chemistry ?Recent Labs  ?Lab 03/01/22 ?0160 03/02/22 ?0336 03/03/22 ?1093 03/04/22 ?2355 03/05/22 ?7322  ?NA 142   < > 137 138 135  ?K 4.0   < > 4.0 4.0 3.9  ?CL 99   < > 99 102 99  ?CO2 30   < > _0 ?GLUCOSE 219*   < > 254* 151* 88  ?BUN 34*   < > 42* 45* 50*  ?CREATININE 1.46*   < > 1.23 1.37* 1.25*  ?CALCIUM 9.0   < > 8.4* 8.4* 8.6*  ?PROT 6.0*  --   --   --   --   ?ALBUMIN 3.1*  --   --   --   --   ?AST 26  --   --   --   --   ?ALT 17  --   --   --   --   ?ALKPHOS 76  --   --   --   --   ?BILITOT 1.4*  --   --   --   --   ?GFRNONAA 46*   < > 57* 50* 56*  ?ANIONGAP 13   < > _1 ? < > = values in this interval not displayed.  ?  ?Lipids No results for input(s):  CHOL, TRIG, HDL, LABVLDL, LDLCALC, CHOLHDL in the last 168 hours.  ?Hematology ?Recent Labs  ?Lab 03/01/22 ?0254 03/02/22 ?0336 03/03/22 ?0245  ?WBC 8.7 6.6 7.0  ?RBC 3.18* 3.27* 3.01*  ?HGB 10.6* 10.8* 10.3*  ?HCT 33.5* 34.5* 31.2*  ?MCV 105.3* 105.5* 103.7*  ?MCH 33.3 33.0 34.2*  ?MCHC 31.6 31.3 33.0  ?RDW 16.5* 16.7* 16.4*  ?PLT 186 199 196  ? ?Thyroid  ?Recent Labs  ?Lab 03/01/22 ?1413  ?TSH 1.200  ?  ?BNP ?Recent Labs  ?Lab 03/01/22 ?0847  ?BNP 2,092.5*  ?  ?DDimer No results for input(s): DDIMER in the last 168 hours.  ? ?Radiology  ?  ?No results found. ? ?Cardiac Studies  ? ?Echo 03/02/22: ?1. Left ventricular ejection fraction, by estimation, is 55 to 60%. The  ?left ventricle has normal function. The left ventricle has no regional  ?wall motion abnormalities. Left ventricular diastolic parameters are  ?indeterminate.  ? 2. Right ventricular systolic function is moderately reduced. The right  ?ventricular size is mildly enlarged. There is severely elevated pulmonary  ?artery systolic pressure.  ? 3. Left atrial size was severely dilated.  ? 4. Right atrial size was severely dilated.  ? 5. ERO > .4 similar to prior echo and PISA radius 1 MAC with leaflet  ?thickening and restricted posterior leaflet . The mitral valve is normal  ?in structure. Severe mitral valve regurgitation. No evidence of mitral  ?stenosis.  ? 6. Tricuspid valve regurgitation is severe.  ? 7. Post TAVR with 26 mm Sapien 3 mild PVL seen best on SA views at 7:00  ?mean gradient 6 mmHg lower than TTE 11/21/21 . The aortic valve has been  ?repaired/replaced. Aortic valve regurgitation is not visualized. No aortic  ?stenosis is present.  ? 8. The inferior vena cava is dilated in size with >50% respiratory  ?variability, suggesting right  atrial pressure of 8 mmHg.  ? ?Patient Profile  ?   ?88 y.o. male with aortic stenosis s/p TAVR, severe MR/TR, persistent atrial fibrillation not on anticoagulation, CKD 3, admitted with acute on chronic  diastolic heart failure and altered mental status. ? ?Assessment & Plan  ?  ?#Acute on chronic diastolic heart failure: ?#Severe pulmonary hypertension: ?#Severe MR/TR: ?#Air stenosis status post TAVR: ?Echo this admission with LVEF 55 to 60% with moderate RV dysfunction and severe pulmonary hypertension.  He also has severe MR and TR.  TAVR valve functioning well.  BNP was over 2000.  Not a candidate for any valve interventions due to age and frailty.  He is net -4.3 L this admission.  Yesterday he was started on digoxin.  Check level in 2 days.  BP is a little better this am.  Will transition lasix to 54m bid.   Continue Farxiga. ? ?#Persistent atrial fibrillation: ?He remains in atrial fibrillation.  Rates are pretty well controlled.  He is not on anticoagulation due to recurrent GI bleed and falls.  Not a watchman candidate.  No plan for DCCV.  He would not maintain sinus rhythm given his severe MR and TR.  Agree with palliative care consultation.  Started digoxin as above.  ? ?#Hypertension: ?Blood pressure is tenuous.  Cannot further titrate his metoprolol ? ?#UTI: ?#Hallucination: ?Management per IM. ? ?# Hyperlipidemia:  ?Continue rosuvastatin. ?   ? ?For questions or updates, please contact [facility]?Please consult [email or web address] for contact info under  ? ?  ?   ?Signed, ?TSkeet Latch MD  ?03/05/2022, 10:54 AM   ? ?

## 2022-03-05 NOTE — Progress Notes (Signed)
?PROGRESS NOTE ? ? ? ?[name]  IEP:[id number] DOB: 05/21/1933 DOA: 03/01/2022 ?PCP: [name], MD  ?88 y.o. male with history of CAD, AS s/p TAVR, chronic diastolic CHF, severe mitral regurgitation, severe PAH, paroxysmal atrial fibrillation, no longer on anticoagulation due to recurrent GI bleeds, chronic anemia was brought to the ED on account of weakness, confusion and failure to thrive. ?-Also recently had increased swelling, torsemide dose was increased 10 days ago, also having intermittent hallucinations, just completed course of antibiotics for UTI 2 days prior to admission ?-In the emergency room he was afebrile, labs noted creatinine of 1.46, BNP 2092, chest x-ray noted small right pleural effusion and cardiomegaly ?-Diuresed with IV Lasix, seen by palliative care as well ? ?Subjective: ?-Feels okay overall, a bit tired, breathing better ? ?Assessment and Plan: ? ?Acute on chronic diastolic CHF  ?Right heart failure, severe MR, severe TR  ?Severe PAH ?History of TAVR ?-Diuresing on IV Lasix, he is 4.6 L negative, weight down 18 lbs ?-Cardiology following, due to advanced age and frailty he would not be considered a candidate for transcatheter mitral valve repair ?-Continue metoprolol, Farxiga, Lasix changed to p.o. today ?-Overall prognosis is guarded, seen by palliative care in consultation, now DNR, plan for palliative care follow-up after discharge ?-PT OT eval completed, SNF recommended for short-term rehab ?-Discharge planning ? ?Mild lactic acidosis ?-Likely secondary to right heart failure, clinically do not suspect acute infectious process at this time ?-Lactic acidosis has resolved, he is afebrile without leukocytosis, urinalysis and chest x-ray unremarkable ?-Stable off antibiotics ?  ?Acute metabolic encephalopathy ?-Likely delirium or sundowning ?-Recent history of hallucinations, he is awake and alert, oriented x3   with mild cognitive deficits only, MRI brain was unremarkable,  infectious work-up also unrevealing ?-TSH is normal ?  ?Acute kidney injury ?-Mild AKI, cardiorenal, improving on diuretics ?  ?Chronic atrial fibrillation ?-Heart rate improving, continue metoprolol , no longer on anticoagulation due to recurrent GI bleed  ?  ?Essential hypertension ?-BP soft now, continue metoprolol ? ?Anemia of chronic disease ?-Hemoglobin stable ?  ?Diabetes mellitus type 2, well controlled ?- Last hemoglobin A1c was 5.4 on 12/15/2021. ?-CBG stable on current dose of Levemir ?  ?Prostate cancer ?-Continue Casodex ?  ?Moderate protein calorie malnutrition ?Albumin 3.1 ? ?Chronic leg wounds ?-Routine wound care ?  ?Advance Care Planning:   Code Status: Now DNR ?  ?Consults: Cardiology ?  ?Family Communication: No family at bedside, called and updated daughter 5/1 ?Disposition: SNF tomorrow if stable ? ?Procedures:  ? ?Antimicrobials:  ? ? ?Objective: ?Vitals:  ? 03/04/22 2135 03/04/22 2205 03/05/22 0116 03/05/22 0636  ?BP:  (!) 115/53 (!) 116/54 121/80  ?Pulse: 74 98 97 92  ?Resp: (!) 22 20 (!) 22 16  ?Temp:   98.2 ?F (36.8 ?C) 98 ?F (36.7 ?C)  ?TempSrc:   Oral Oral  ?SpO2: 94% 98% 98% 96%  ?Weight:    64.5 kg  ?Height:      ? ? ?Intake/Output Summary (Last 24 hours) at 03/05/2022 1404 ?Last data filed at 03/05/2022 0845 ?Gross per 24 hour  ?Intake 477 ml  ?Output 850 ml  ?Net -373 ml  ? ?Filed Weights  ? 03/03/22 0019 03/04/22 0304 03/05/22 0636  ?Weight: 64.2 kg 64.7 kg 64.5 kg  ? ? ?Examination: ? ?General exam: Pleasant chronically ill elderly male sitting up in bed, AAOx3, no distress, mild cognitive deficits ?CVS: S1-S2, regular rhythm, systolic murmur ?Lungs: Few basilar rales, otherwise clear ?Abdomen: Firm,  mildly distended, nontender, bowel sounds present  ?Extremities: No edema, chronic venous stasis and scaling in both legs, superficial wounds with Tegaderm ?Psychiatry:  Mood & affect appropriate.  ? ? ? ?Data Reviewed:  ? ?CBC: ?Recent Labs  ?Lab 03/01/22 ?8119 03/02/22 ?0336  03/03/22 ?0245  ?WBC 8.7 6.6 7.0  ?NEUTROABS 7.2  --   --   ?HGB 10.6* 10.8* 10.3*  ?HCT 33.5* 34.5* 31.2*  ?MCV 105.3* 105.5* 103.7*  ?PLT 186 199 196  ? ?Basic Metabolic Panel: ?Recent Labs  ?Lab 03/01/22 ?1478 03/02/22 ?0336 03/03/22 ?2956 03/04/22 ?2130 03/05/22 ?8657  ?NA 142 141 137 138 135  ?K 4.0 3.8 4.0 4.0 3.9  ?CL 99 99 99 102 99  ?CO2 '30 31 30 28 29  '$ ?GLUCOSE 219* 305* 254* 151* 88  ?BUN 34* 37* 42* 45* 50*  ?CREATININE 1.46* 1.37* 1.23 1.37* 1.25*  ?CALCIUM 9.0 8.9 8.4* 8.4* 8.6*  ? ?GFR: ?Estimated Creatinine Clearance: 38 mL/min (A) (by C-G formula based on SCr of 1.25 mg/dL (H)). ?Liver Function Tests: ?Recent Labs  ?Lab 03/01/22 ?0847  ?AST 26  ?ALT 17  ?ALKPHOS 76  ?BILITOT 1.4*  ?PROT 6.0*  ?ALBUMIN 3.1*  ? ?No results for input(s): LIPASE, AMYLASE in the last 168 hours. ?No results for input(s): AMMONIA in the last 168 hours. ?Coagulation Profile: ?No results for input(s): INR, PROTIME in the last 168 hours. ?Cardiac Enzymes: ?No results for input(s): CKTOTAL, CKMB, CKMBINDEX, TROPONINI in the last 168 hours. ?BNP (last 3 results) ?Recent Labs  ?  12/05/21 ?0915  ?PROBNP 6,343*  ? ?HbA1C: ?No results for input(s): HGBA1C in the last 72 hours. ?CBG: ?Recent Labs  ?Lab 03/04/22 ?1117 03/04/22 ?1611 03/04/22 ?2202 03/05/22 ?8469 03/05/22 ?1118  ?GLUCAP 348* 257* 214* 92 225*  ? ?Lipid Profile: ?No results for input(s): CHOL, HDL, LDLCALC, TRIG, CHOLHDL, LDLDIRECT in the last 72 hours. ?Thyroid Function Tests: ?No results for input(s): TSH, T4TOTAL, FREET4, T3FREE, THYROIDAB in the last 72 hours. ? ?Anemia Panel: ?No results for input(s): VITAMINB12, FOLATE, FERRITIN, TIBC, IRON, RETICCTPCT in the last 72 hours. ?Urine analysis: ?   ?Component Value Date/Time  ? COLORURINE YELLOW 03/01/2022 1300  ? APPEARANCEUR CLEAR 03/01/2022 1300  ? LABSPEC 1.012 03/01/2022 1300  ? LABSPEC 1.010 02/05/2006 1006  ? PHURINE 6.0 03/01/2022 1300  ? GLUCOSEU >=500 (A) 03/01/2022 1300  ? HGBUR NEGATIVE 03/01/2022 1300   ? [place] NEGATIVE 03/01/2022 1300  ? BILIRUBINUR Negative 02/05/2006 1006  ? [place] NEGATIVE 03/01/2022 1300  ? PROTEINUR NEGATIVE 03/01/2022 1300  ? UROBILINOGEN 2.0 (H) 01/15/2013 1411  ? NITRITE NEGATIVE 03/01/2022 1300  ? LEUKOCYTESUR NEGATIVE 03/01/2022 1300  ? LEUKOCYTESUR Negative 02/05/2006 1006  ? ?Sepsis Labs: ?'@LABRCNTIP'$ (procalcitonin:4,lacticidven:4) ? ?) ?Recent Results (from the past 240 hour(s))  ?Urine Culture     Status: Abnormal  ? Collection Time: 03/01/22  1:00 PM  ? Specimen: Urine, Clean Catch  ?Result Value Ref Range Status  ? Specimen Description URINE, CLEAN CATCH  Final  ? Special Requests NONE  Final  ? Culture (A)  Final  ?  <10,000 COLONIES/mL INSIGNIFICANT GROWTH ?Performed at [facility], [place] [address], [place],  [address] ?  ? Report Status 03/02/2022 FINAL  Final  ?Culture, blood (Routine X 2) w Reflex to ID Panel     Status: None (Preliminary result)  ? Collection Time: 03/01/22  6:42 PM  ? Specimen: BLOOD  ?Result Value Ref Range Status  ? Specimen Description BLOOD RIGHT ANTECUBITAL  Final  ?  Special Requests   Final  ?  BOTTLES DRAWN AEROBIC AND ANAEROBIC Blood Culture adequate volume  ? Culture   Final  ?  NO GROWTH 3 DAYS ?Performed at [facility], [name] [address] [place], [place] [address] ?  ? Report Status PENDING  Incomplete  ?Culture, blood (Routine X 2) w Reflex to ID Panel     Status: None (Preliminary result)  ? Collection Time: 03/01/22  6:42 PM  ? Specimen: BLOOD  ?Result Value Ref Range Status  ? Specimen Description BLOOD LEFT ANTECUBITAL  Final  ? Special Requests   Final  ?  BOTTLES DRAWN AEROBIC AND ANAEROBIC Blood Culture adequate volume  ? Culture   Final  ?  NO GROWTH 3 DAYS ?Performed at [facility], [place] [address] [place], [place] [address] ?  ? Report Status PENDING  Incomplete  ?  ? ?Radiology Studies: ?No results found. ? ? ?Scheduled Meds: ? aspirin EC  81 mg Oral Daily  ? cetaphil   Topical Daily  ? dapagliflozin  propanediol  10 mg Oral Daily  ? digoxin  0.125 mg Oral Daily  ? enoxaparin (LOVENOX) injection  40 mg Subcutaneous Q24H  ? feeding supplement (GLUCERNA SHAKE)  237 mL Oral TID BM  ? fluticasone  1 spray Each Nare Daily

## 2022-03-05 NOTE — Progress Notes (Signed)
Mobility Specialist Progress Note: ? ? 03/05/22 1000  ?Mobility  ?Activity Ambulated with assistance to bathroom  ?Level of Assistance Moderate assist, patient does 50-74%  ?Assistive Device Front wheel walker  ?Distance Ambulated (ft) 15 ft  ?Activity Response Tolerated fair  ?$Mobility charge 1 Mobility  ? ?Pt requesting to go to BR. Required modA to stand from chair, minA with gait. Pt left in BR, educated to pull string when finished. Will f/u to get back.  ? ?Nelta Numbers ?Acute Rehab ?Phone: [phone] ?Office Phone: [phone] ? ?

## 2022-03-05 NOTE — TOC Progression Note (Signed)
Transition of Care (TOC) - Progression Note  ? ? ?Patient Details  ?Name: DAMARCUS REGGIO ?MRN: [id number] ?Date of Birth: Apr 06, 1933 ? ?Transition of Care (TOC) CM/SW Contact  ?Kristion Holifield Renold Don, LCSWA ?Phone Number: ?03/05/2022, 4:30 PM ? ?Clinical Narrative:    ?CSW spoke with admissions at [facility] to inquire about bed offer, they are able to make offer. CSW started [place] and contacted pt daughter to confirm [place]. No answer CSW left a message and will follow up when auth is back.  ? ? ?Expected Discharge Plan: [place] ?Barriers to Discharge: Continued Medical Work up, SNF Pending bed offer, Insurance Authorization ? ?Expected Discharge Plan and Services ?Expected Discharge Plan: [place] ?  ?  ?  ?Living arrangements for the past 2 months: [place] ?                ?  ?  ?  ?  ?  ?  ?  ?  ?  ?  ? ? ?Social Determinants of Health (SDOH) Interventions ?Food Insecurity Interventions: Intervention Not Indicated ?Housing Interventions: Intervention Not Indicated ?Transportation Interventions: Intervention Not Indicated ? ?Readmission Risk Interventions ?   ? View : No data to display.  ?  ?  ?  ? ? ?

## 2022-03-05 NOTE — Plan of Care (Signed)
?  Problem: Health Behavior/Discharge Planning: ?Goal: Ability to manage health-related needs will improve ?Outcome: Progressing ?  ?Problem: Clinical Measurements: ?Goal: Ability to maintain clinical measurements within normal limits will improve ?Outcome: Progressing ?Goal: Will remain free from infection ?Outcome: Progressing ?Goal: Respiratory complications will improve ?Outcome: Progressing ?Goal: Cardiovascular complication will be avoided ?Outcome: Progressing ?  ?Problem: Activity: ?Goal: Risk for activity intolerance will decrease ?Outcome: Progressing ?  ?Problem: Nutrition: ?Goal: Adequate nutrition will be maintained ?Outcome: Progressing ?  ?Problem: Coping: ?Goal: Level of anxiety will decrease ?Outcome: Progressing ?  ?Problem: Elimination: ?Goal: Will not experience complications related to bowel motility ?Outcome: Progressing ?Goal: Will not experience complications related to urinary retention ?Outcome: Progressing ?  ?Problem: Pain Managment: ?Goal: General experience of comfort will improve ?Outcome: Progressing ?  ?Problem: Activity: ?Goal: Capacity to carry out activities will improve ?Outcome: Progressing ?  ?

## 2022-03-06 DIAGNOSIS — N182 Chronic kidney disease, stage 2 (mild): Secondary | ICD-10-CM | POA: Diagnosis not present

## 2022-03-06 DIAGNOSIS — I482 Chronic atrial fibrillation, unspecified: Secondary | ICD-10-CM | POA: Diagnosis not present

## 2022-03-06 DIAGNOSIS — G9341 Metabolic encephalopathy: Secondary | ICD-10-CM | POA: Diagnosis not present

## 2022-03-06 DIAGNOSIS — I5033 Acute on chronic diastolic (congestive) heart failure: Secondary | ICD-10-CM | POA: Diagnosis not present

## 2022-03-06 LAB — CULTURE, BLOOD (ROUTINE X 2)
Culture: NO GROWTH
Culture: NO GROWTH
Special Requests: ADEQUATE
Special Requests: ADEQUATE

## 2022-03-06 LAB — GLUCOSE, CAPILLARY
Glucose-Capillary: 108 mg/dL — ABNORMAL HIGH (ref 70–99)
Glucose-Capillary: 246 mg/dL — ABNORMAL HIGH (ref 70–99)
Glucose-Capillary: 200 mg/dL — ABNORMAL HIGH (ref 70–99)
Glucose-Capillary: 233 mg/dL — ABNORMAL HIGH (ref 70–99)

## 2022-03-06 LAB — BASIC METABOLIC PANEL
Anion gap: 10 (ref 5–15)
BUN: 51 mg/dL — ABNORMAL HIGH (ref 8–23)
CO2: 28 mmol/L (ref 22–32)
Calcium: 8.7 mg/dL — ABNORMAL LOW (ref 8.9–10.3)
Chloride: 99 mmol/L (ref 98–111)
Creatinine, Ser: 1.23 mg/dL (ref 0.61–1.24)
GFR, Estimated: 57 mL/min — ABNORMAL LOW (ref >60)
Glucose, Bld: 164 mg/dL — ABNORMAL HIGH (ref 70–99)
Potassium: 4.2 mmol/L (ref 3.5–5.1)
Sodium: 137 mmol/L (ref 135–145)

## 2022-03-06 NOTE — Progress Notes (Addendum)
? ?Progress Note ? ?Patient Name: [name] ?Date of Encounter: 03/06/2022 ? ?[facility] Cardiologist: [name], MD  ? ?Subjective  ? ?No chest pain no SOB wants to go home, but happy to sit in chair now. ? ?Inpatient Medications  ?  ?Scheduled Meds: ? aspirin EC  81 mg Oral Daily  ? cetaphil   Topical Daily  ? dapagliflozin propanediol  10 mg Oral Daily  ? digoxin  0.125 mg Oral Daily  ? enoxaparin (LOVENOX) injection  40 mg Subcutaneous Q24H  ? feeding supplement (GLUCERNA SHAKE)  237 mL Oral TID BM  ? fluticasone  1 spray Each Nare Daily  ? furosemide  40 mg Oral BID  ? gabapentin  100 mg Oral QHS  ? hydrocerin   Topical BID  ? insulin aspart  0-5 Units Subcutaneous QHS  ? insulin aspart  0-9 Units Subcutaneous TID WC  ? insulin aspart  2 Units Subcutaneous TID WC  ? insulin detemir  25 Units Subcutaneous Daily  ? loratadine  10 mg Oral Daily  ? mouth rinse  15 mL Mouth Rinse BID  ? metoprolol succinate  12.5 mg Oral Daily  ? multivitamin with minerals  1 tablet Oral Daily  ? pantoprazole  40 mg Oral Daily  ? rosuvastatin  5 mg Oral QHS  ? sodium chloride flush  3 mL Intravenous Q12H  ? ?Continuous Infusions: ? sodium chloride    ? ?PRN Meds: ?sodium chloride, acetaminophen, ondansetron (ZOFRAN) IV, sodium chloride flush  ? ?Vital Signs  ?  ?Vitals:  ? 03/04/22 2205 03/05/22 0116 03/05/22 0636 03/05/22 2210  ?BP: (!) 115/53 (!) 116/54 121/80 (!) 114/50  ?Pulse: 98 97 92 84  ?Resp: 20 (!) _0 ?Temp:  98.2 ?F (36.8 ?C) 98 ?F (36.7 ?C) (!) 97.5 ?F (36.4 ?C)  ?TempSrc:  Oral Oral Oral  ?SpO2: 98% 98% 96% 100%  ?Weight:   64.5 kg   ?Height:      ? ? ?Intake/Output Summary (Last 24 hours) at 03/06/2022 0836 ?Last data filed at 03/05/2022 1900 ?Gross per 24 hour  ?Intake 480 ml  ?Output 600 ml  ?Net -120 ml  ? ? ?  03/05/2022  ?  6:36 AM 03/04/2022  ?  3:04 AM 03/03/2022  ? 12:19 AM  ?Last 3 Weights  ?Weight (lbs) 142 lb 3.2 oz 142 lb 10.2 oz 141 lb 8.6 oz  ?Weight (kg) 64.501 kg 64.7 kg 64.2 kg  ?    ? ?Telemetry  ?  ?Atrial fib  - Personally Reviewed ? ?ECG  ?  ?No new - Personally Reviewed ? ?Physical Exam  ? ?GEN: No acute distress.   ?Neck: No JVD sitting up ?Cardiac: irreg irreg, rate controlled 2/6 murmur, no rubs, or gallops.  ?Respiratory: Clear to auscultation bilaterally though some rales in Lt base. ?GI: Soft, nontender, non-distended  ?MS: No edema; No deformity. ?Neuro:  Nonfocal  ?Psych: Normal affect  ?Skin with op site  ? ?Labs  ?  ?High Sensitivity Troponin:   ?Recent Labs  ?Lab 03/01/22 ?5956 03/01/22 ?1300  ?TROPONINIHS 26* 31*  ?   ?Chemistry ?Recent Labs  ?Lab 03/01/22 ?3875 03/02/22 ?6433 03/04/22 ?2951 03/05/22 ?8841 03/06/22 ?0333  ?NA 142   < > 138 135 137  ?K 4.0   < > 4.0 3.9 4.2  ?CL 99   < > 102 99 99  ?CO2 30   < > _1 ?GLUCOSE 219*   < > 151*  88 164*  ?BUN 34*   < > 45* 50* 51*  ?CREATININE 1.46*   < > 1.37* 1.25* 1.23  ?CALCIUM 9.0   < > 8.4* 8.6* 8.7*  ?PROT 6.0*  --   --   --   --   ?ALBUMIN 3.1*  --   --   --   --   ?AST 26  --   --   --   --   ?ALT 17  --   --   --   --   ?ALKPHOS 76  --   --   --   --   ?BILITOT 1.4*  --   --   --   --   ?GFRNONAA 46*   < > 50* 56* 57*  ?ANIONGAP 13   < > _0 ? < > = values in this interval not displayed.  ?  ?Lipids No results for input(s): CHOL, TRIG, HDL, LABVLDL, LDLCALC, CHOLHDL in the last 168 hours.  ?Hematology ?Recent Labs  ?Lab 03/01/22 ?4627 03/02/22 ?0336 03/03/22 ?0245  ?WBC 8.7 6.6 7.0  ?RBC 3.18* 3.27* 3.01*  ?HGB 10.6* 10.8* 10.3*  ?HCT 33.5* 34.5* 31.2*  ?MCV 105.3* 105.5* 103.7*  ?MCH 33.3 33.0 34.2*  ?MCHC 31.6 31.3 33.0  ?RDW 16.5* 16.7* 16.4*  ?PLT 186 199 196  ? ?Thyroid  ?Recent Labs  ?Lab 03/01/22 ?1413  ?TSH 1.200  ?  ?BNP ?Recent Labs  ?Lab 03/01/22 ?0847  ?BNP 2,092.5*  ?  ?DDimer No results for input(s): DDIMER in the last 168 hours.  ? ?Radiology  ?  ?No results found. ? ?Cardiac Studies  ? ?Echo 03/02/22: ?1. Left ventricular ejection fraction, by estimation, is 55 to 60%. The  ?left ventricle has  normal function. The left ventricle has no regional  ?wall motion abnormalities. Left ventricular diastolic parameters are  ?indeterminate.  ? 2. Right ventricular systolic function is moderately reduced. The right  ?ventricular size is mildly enlarged. There is severely elevated pulmonary  ?artery systolic pressure.  ? 3. Left atrial size was severely dilated.  ? 4. Right atrial size was severely dilated.  ? 5. ERO > .4 similar to prior echo and PISA radius 1 MAC with leaflet  ?thickening and restricted posterior leaflet . The mitral valve is normal  ?in structure. Severe mitral valve regurgitation. No evidence of mitral  ?stenosis.  ? 6. Tricuspid valve regurgitation is severe.  ? 7. Post TAVR with 26 mm Sapien 3 mild PVL seen best on SA views at 7:00  ?mean gradient 6 mmHg lower than TTE 11/21/21 . The aortic valve has been  ?repaired/replaced. Aortic valve regurgitation is not visualized. No aortic  ?stenosis is present.  ? 8. The inferior vena cava is dilated in size with >50% respiratory  ?variability, suggesting right atrial pressure of 8 mmHg.  ? ?Patient Profile  ?   ?88 y.o. male with aortic stenosis s/p TAVR, severe MR/TR, persistent atrial fibrillation not on anticoagulation, CKD 3, admitted with acute on chronic diastolic heart failure and altered mental status. ? ?Assessment & Plan  ?  ?#Acute on chronic diastolic heart failure: ?#Severe pulmonary hypertension: ?#Severe MR/TR: ?#Air stenosis status post TAVR: ?Echo this admission with LVEF 55 to 60% with moderate RV dysfunction and severe pulmonary hypertension.  He also has severe MR and TR.  TAVR valve functioning well.  BNP was over 2000.  Not a candidate for any valve interventions due to age and frailty.  (neg 30 yesterday) He  is net -5.4 L this admission. Wt without much change.   03/04/22 he was started on digoxin.  Check level in AM.  BP is a little better this am.  Will transition lasix to 74m bid.   Continue Farxiga. ?  ?#Persistent atrial  fibrillation: ?He remains in atrial fibrillation.  Rates are pretty well controlled.  He is not on anticoagulation due to recurrent GI bleed and falls.  Not a watchman candidate.  No plan for DCCV.  He would not maintain sinus rhythm given his severe MR and TR.  Agree with palliative care and pt is now DNR.  Started digoxin as above.  ?  ?#Hypertension: ?Blood pressure is tenuous.  Cannot further titrate his metoprolol ?  ?#UTI: ?#Hallucination: ?Management per IM. ?  ?# Hyperlipidemia:  ?Continue rosuvastatin. ?   ? ?For questions or updates, please contact [place]?Please consult [email or web address] for contact info under  ? ?  ?   ?Signed, ?LCecilie Kicks NP  ?03/06/2022, 8:36 AM   ? ?

## 2022-03-06 NOTE — Assessment & Plan Note (Addendum)
Patient was admitted to the cardiac ward and was placed on IV furosemide for diuresis, negative fluid balance was achieved, - 9,383 ml, with significant improvement in his symptoms.  ? ?Echocardiogram with preserved LV systolic function EF 55 to 60% with moderate reduction in RV systolic function, Severe elevated pulmonary artery systolic pressure, bilateral atrial dilatation. Severe TR. Aortic valve st TAVR.  ? ?Pulmonary hypertension with acute on chronic core pulmonale.  ? ?Continue medical therapy with digoxin, dapagliflozin and metoprolol.  ?Diuresis with furosemide 40 mg po bid.  ?Follow up as outpatient.  ?

## 2022-03-06 NOTE — Progress Notes (Signed)
Mobility Specialist Progress Note: ? ? 03/06/22 1625  ?Mobility  ?Activity  ?(chair level exercises)  ?Range of Motion/Exercises Active;Right leg;Left leg  ?Assistive Device None  ?Activity Response Tolerated well  ?$Mobility charge 1 Mobility  ? ?Pt agreeable to mobility session, declines ambulation at this time d/t fatigue. Pt circulation to BLE noted to be poor. Pt performed multiple BLE exercises, tolerated well. Legs elevated at end of session, pt states he will be uncomfortable in ~67mn d/t pressure on buttocks. Will check back in in ~30 min to assess comfort. Chair alarm on.  ? ?ANelta Numbers?Acute Rehab ?Phone: [phone] ?Office Phone: [phone]? ?

## 2022-03-06 NOTE — Assessment & Plan Note (Addendum)
Rate control with metoprolol and digoxin  ?Telemetry with HR in the 60 range with good toleration.  ?Currently off anticoagulation due to history of GI bleed in the past. ?Continue with aspirin.  ?

## 2022-03-06 NOTE — Assessment & Plan Note (Addendum)
Continue nutritional supplements.  ? ?Stage 2 pressure injury coccyx, continue with local care.  ?Present on admission.  ?

## 2022-03-06 NOTE — Assessment & Plan Note (Signed)
Clinically resolved.  

## 2022-03-06 NOTE — Assessment & Plan Note (Addendum)
Continue blood pressure control with metoprolol.  ?Systolic blood pressure at the time of discharge 109 to 123 mmHg.  ?

## 2022-03-06 NOTE — Progress Notes (Addendum)
Physical Therapy Treatment ?Patient Details ?Name: [name] ?MRN: [id number] ?DOB: 04-14-1933 ?Today's Date: 03/06/2022 ? ? ?History of Present Illness 88 y.o. M adm 4/28 with sepsis, CHF exacerbation and encephalopathy. PMhx: HFpEF, AS s/p TAVR, CAD, moderate mitral regurgitation, type 2 diabetes, HTN, persistent Afib, PAD, CKD 3b, GIB, prostate CA. ? ?  ?PT Comments  ? ? Pt received in chair, daughter present and encouraging, pt motivated to progress mobility and with excellent participation and good tolerance for gait and LE therapeutic exercises. Pt with crouched posture and forward head/downward gaze throughout all standing tasks with max cues needed for postural awareness and up to minA for gait for short household distances using RW. Pt needing min to modA for static standing without AD. Pt able to perform >15 minutes worth of seated/standing BLE exercises with mod tactile cues needed throughout for proper body mechanics. Pt continues to benefit from PT services to progress toward functional mobility goals.   ?Recommendations for follow up therapy are one component of a multi-disciplinary discharge planning process, led by the attending physician.  Recommendations may be updated based on patient status, additional functional criteria and insurance authorization. ? ?Follow Up Recommendations ? Skilled nursing-short term rehab (<3 hours/day) ?  ?  ?Assistance Recommended at Discharge Frequent or constant Supervision/Assistance  ?Patient can return home with the following A little help with walking and/or transfers;A little help with bathing/dressing/bathroom;Assistance with cooking/housework;Direct supervision/assist for financial management;Assist for transportation;Direct supervision/assist for medications management ?  ?Equipment Recommendations ? None recommended by PT  ?  ?Recommendations for Other Services   ? ? ?  ?Precautions / Restrictions Precautions ?Precautions: Fall ?Restrictions ?Weight  Bearing Restrictions: No  ?  ? ?Mobility ? Bed Mobility ?  ?  ?  ?  ?  ?  ?  ?General bed mobility comments: pt received/remained in recliner ?  ? ?Transfers ?Overall transfer level: Needs assistance ?Equipment used: Rolling walker (2 wheels), None ?Transfers: Sit to/from Stand ?Sit to Stand: Min guard, Min assist ?  ?  ?  ?  ?  ?General transfer comment: from EOB<>RW with min guard assist after initial minA; when standing without RW support, pt needing minA and HHA for steadying. Pt performed >10 total reps with some serial STS (x5 reps) and some with intermittent rest breaks. ?  ? ?Ambulation/Gait ?Ambulation/Gait assistance: Min assist ?Gait Distance (Feet): 45 Feet ?Assistive device: Rolling walker (2 wheels) ?Gait Pattern/deviations: Step-through pattern, Decreased stride length, Trunk flexed ?  ?  ?  ?General Gait Details: pt with flexed trunk, maintained hip and knee flexion with guarding and cues for direction. Pt walked ~20 ft from far corner of room to sink then additional 25' with assist and limited by fatigue after standing at sink ~5 mins. ? ? ? ? ?  ?Balance Overall balance assessment: Needs assistance ?Sitting-balance support: No upper extremity supported, Feet supported ?Sitting balance-Leahy Scale: Good ?Sitting balance - Comments: static sitting without assist ?  ?Standing balance support: Bilateral upper extremity supported, Reliant on assistive device for balance ?Standing balance-Leahy Scale: Poor ?Standing balance comment: pt needing min/modA with U UE support and up to minA for standing tasks with BUE support ?  ?   ?  ? ?  ?Cognition Arousal/Alertness: Awake/alert ?Behavior During Therapy: [facility] for tasks assessed/performed ?Overall Cognitive Status: Within Functional Limits for tasks assessed ?Area of Impairment: Safety/judgement ?  ?  ?   ?  ?Safety/Judgement: Decreased awareness of safety, Decreased awareness of deficits ?  ?  ?General  Comments: Pt with improving activity tolerance but  denies fatigue after multiple standing exercises and gait trial, with further questioning pt admits to some fatigue. Frequent seated breaks given for recovery of VS and safety. ?  ?  ? ?  ?Exercises Other Exercises ?Other Exercises: seated BLE AROM: hip flexion, LAQ x10-15 reps ea ?Other Exercises: standing BLE AROM: hip flexion, heel raises, hamstring curls x10-15 reps ea (tactile assist to maintain correct body mechanics) ?Other Exercises: STS x 10 reps (5 serial STS and 5 with rest breaks) ? ?  ?General Comments General comments (skin integrity, edema, etc.): SpO2 with poor pleth signal, after pt washed hands in warm water ~3 mins, rechecked and SpO2 reading 99-100% on RA once signal reading correctly. HR WFL. ?  ?  ? ?Pertinent Vitals/Pain Pain Assessment ?Pain Assessment: Faces ?Faces Pain Scale: Hurts a little bit ?Pain Location: bottom ?Pain Descriptors / Indicators: Aching, Sore ?Pain Intervention(s): Monitored during session, Repositioned, Other (comment) (chair reclined back ~10-15 deg from upright and pt reports this is more comfortable)  ? ? ?   ?   ? ?PT Goals (current goals can now be found in the care plan section) Acute Rehab PT Goals ?Patient Stated Goal: return home ?PT Goal Formulation: With patient ?Time For Goal Achievement: 03/16/22 ?Progress towards PT goals: Progressing toward goals ? ?  ?Frequency ? ? ? Min 3X/week ? ? ? ?  ?PT Plan Current plan remains appropriate  ? ? ?   ?AM-PAC PT "6 Clicks" Mobility   ?Outcome Measure ? Help needed turning from your back to your side while in a flat bed without using bedrails?: A Little ?Help needed moving from lying on your back to sitting on the side of a flat bed without using bedrails?: A Little ?Help needed moving to and from a bed to a chair (including a wheelchair)?: A Little ?Help needed standing up from a chair using your arms (e.g., wheelchair or bedside chair)?: A Lot (mod cues for technique and modA to stand without RW) ?Help needed to walk  in hospital room?: A Little ?Help needed climbing 3-5 steps with a railing? : Total ?6 Click Score: 15 ? ?  ?End of Session Equipment Utilized During Treatment: Gait belt ?Activity Tolerance: Patient tolerated treatment well ?Patient left: in chair;with call bell/phone within reach;with chair alarm set;with family/visitor present (daughter present) ?Nurse Communication: Mobility status ?PT Visit Diagnosis: Other abnormalities of gait and mobility (R26.89);Difficulty in walking, not elsewhere classified (R26.2);Muscle weakness (generalized) (M62.81) ?  ? ? ?Time: 0623-7628 ?PT Time Calculation (min) (ACUTE ONLY): 34 min ? ?Charges:  $Gait Training: 8-22 mins ?$Therapeutic Exercise: 8-22 mins          ?          ? ?Aleta Manternach P., PTA ?Acute Rehabilitation Services ?Secure Chat Preferred 9a-5:30pm ?Office: [phone]  ? ? ?[place] ?03/06/2022, 2:00 PM ? ?

## 2022-03-06 NOTE — Hospital Course (Addendum)
[name] was admitted to the hospital with the working diagnosis of decompensated heart failure.  ? ?88 yo male with the past medical history of cad, severe AS sp TAVR, T2DM, hypertension CKD and atrial fibrillation who presented with failure to thrive. As outpatient his torsemide was increased due to signs of hypervolemia, his volume status improved but he developed altered mentation, prompting his family to bring him to the hospital. On his initial physical examination his blood pressure was 97/71, 115/65. HR 93 to 115 and RR 17 to 30. Lungs with no rales or wheezing, heart with S1 and S2 present irregularly irregular, abdomen soft and no lower extremity edema.  ? ?Na 142, K 4.0 Cl 99, bicarbonate 30 glucose 219, bun 34, cr 1,46 ?BNP 2,092 ?High sensitive troponin 26 and 31  ?Wbc 8.7 hgb 10.6 plt 186  ?UA SG 1.012 >500 glucose.  ? ?Chest radiograph with cardiomegaly, bilateral interstitial infiltrates predominantly at the lower lobes, small right pleural effusion.  ? ?EKG 114 bpm, left axis deviation, normal qtc, atrial fibrillation with poor R wave progression, with no significant ST segment changes and negative T wave lead I and AvL.  ?  ?Patient was placed on furosemide for diuresis with good toleration.  ?Rate control atrial fibrillation with digoxin. ?Continue diuresis with oral furosemide and close follow up as outpatient.  ?

## 2022-03-06 NOTE — Assessment & Plan Note (Signed)
Follow up as outpatient.  

## 2022-03-06 NOTE — Assessment & Plan Note (Deleted)
Renal function stable with serum cr at 1,23 with K at 4,2 and serum bicarbonate at 28 ?Continue diuresis with furosemide.  ?Follow up renal function in am.  ?

## 2022-03-06 NOTE — Assessment & Plan Note (Addendum)
Patient was placed on insulin therapy, sliding scale and basal for glucose cover and monitoring.  ?Patient is tolerating po well.  ? ?Continue with statin therapy.  ? ?

## 2022-03-06 NOTE — Progress Notes (Signed)
?  Progress Note ? ? ?Patient: [name] JDB:[id number] DOB: [birth year] DOA: 03/01/2022     5 ?DOS: the patient was seen and examined on 03/06/2022 ?  ?Brief hospital course: ?[name] was admitted to the hospital with the working diagnosis of decompensated heart failure.  ? ?88 yo male with the past medical history of cad, severe AS sp TAVR, T2DM, hypertension CKD and atrial fibrillation who presented with failure to thrive. As outpatient his torsemide was increased due to signs of hypervolemia, his volume status improved but he developed altered mentation, prompting his family to bring him to the hospital. On his initial physical examination his blood pressure was 97/71, 115/65. HR 93 to 115 and RR 17 to 30. Lungs with no rales or wheezing, heart with S1 and S2 present irregularly irregular, abdomen soft and no lower extremity edema.  ? ?Patient was placed on furosemide for diuresis with good toleration.  ? ?Assessment and Plan: ?* Heart failure with preserved ejection fraction ([place]) ?Echocardiogram with preserved LV systolic function EF 55 to 60% with moderate reduction in RV systolic function, Severe elevated pulmonary artery systolic pressure, bilateral atrial dilatation. Severe TR. Aortic valve st TAVR.  ? ?Improved volume status ?Urine output is 1,300 ml over last 24 hrs. ?Systolic blood pressure 612 to 130 mmHg.  ? ?Continue medical therapy with digoxin, dapagliflozin and metoprolol.  ?Diuresis with furosemide 40 mg po bid.  ? ?Essential hypertension ?Continue blood pressure control with metoprolol.  ? ?Chronic atrial fibrillation with RVR ([place]) ?Rate control with metoprolol ?Currently off anticoagulation due to history of GI bleed in the past. ?Continue with aspirin.  ? ?Type 2 diabetes mellitus with hyperlipidemia ([place]) ?Continue insulin therapy, sliding scale and basal. ?Patient is tolerating po well.  ? ?Continue with statin therapy.  ? ? ?Prostate cancer ([place]) ?Follow up as outpatient   ? ?Unspecified protein-calorie malnutrition ([place]) ?Continue nutritional supplements.  ? ?Acute metabolic encephalopathy ?Clinically resolved.  ? ?CKD (chronic kidney disease) stage 2, GFR 60-89 ml/min ?Renal function stable with serum cr at 1,23 with K at 4,2 and serum bicarbonate at 28 ?Continue diuresis with furosemide.  ?Follow up renal function in am.  ? ? ? ? ?  ? ?Subjective: Patient out of bed to chair, dyspnea and edema are improving.  ? ?Physical Exam: ?Vitals:  ? 03/05/22 0636 03/05/22 2210 03/06/22 0944 03/06/22 1100  ?BP: 121/80 (!) 114/50 112/69 138/79  ?Pulse: 92 84 83 92  ?Resp: '16 20 18 18  '$ ?Temp: 98 ?F (36.7 ?C) (!) 97.5 ?F (36.4 ?C)    ?TempSrc: Oral Oral    ?SpO2: 96% 100% 100% 98%  ?Weight: 64.5 kg     ?Height:      ? ?Neurology awake and alert ?ENT with mild pallor ?Cardiovascular with S1 and S2 present, positive systolic murmur at the apex 3/6 ?Respiratory with no wheezing ?Abdomen not distended ?Trace bilateral lower extremity edema  ?Data Reviewed: ? ? ? ?Family Communication: no family at the bedside  ? ?Disposition: ?Status is: Inpatient ?Remains inpatient appropriate because: possible transfer to [facility] in am,  ? Planned Discharge Destination: Skilled nursing facility ? ? ? ? ?Author: ?Tawni Millers, MD ?03/06/2022 1:59 PM ? ?For on call review [email or web address].  ?

## 2022-03-07 DIAGNOSIS — Z9181 History of falling: Secondary | ICD-10-CM | POA: Diagnosis not present

## 2022-03-07 DIAGNOSIS — I130 Hypertensive heart and chronic kidney disease with heart failure and stage 1 through stage 4 chronic kidney disease, or unspecified chronic kidney disease: Secondary | ICD-10-CM | POA: Diagnosis not present

## 2022-03-07 DIAGNOSIS — Z8546 Personal history of malignant neoplasm of prostate: Secondary | ICD-10-CM | POA: Diagnosis not present

## 2022-03-07 DIAGNOSIS — N182 Chronic kidney disease, stage 2 (mild): Secondary | ICD-10-CM | POA: Diagnosis not present

## 2022-03-07 DIAGNOSIS — Z952 Presence of prosthetic heart valve: Secondary | ICD-10-CM | POA: Diagnosis not present

## 2022-03-07 DIAGNOSIS — E119 Type 2 diabetes mellitus without complications: Secondary | ICD-10-CM | POA: Diagnosis not present

## 2022-03-07 DIAGNOSIS — K219 Gastro-esophageal reflux disease without esophagitis: Secondary | ICD-10-CM | POA: Diagnosis not present

## 2022-03-07 DIAGNOSIS — Z87891 Personal history of nicotine dependence: Secondary | ICD-10-CM | POA: Diagnosis not present

## 2022-03-07 DIAGNOSIS — E440 Moderate protein-calorie malnutrition: Secondary | ICD-10-CM | POA: Diagnosis not present

## 2022-03-07 DIAGNOSIS — D649 Anemia, unspecified: Secondary | ICD-10-CM | POA: Diagnosis not present

## 2022-03-07 DIAGNOSIS — I482 Chronic atrial fibrillation, unspecified: Secondary | ICD-10-CM | POA: Diagnosis not present

## 2022-03-07 DIAGNOSIS — E1149 Type 2 diabetes mellitus with other diabetic neurological complication: Secondary | ICD-10-CM | POA: Diagnosis not present

## 2022-03-07 DIAGNOSIS — D689 Coagulation defect, unspecified: Secondary | ICD-10-CM | POA: Diagnosis not present

## 2022-03-07 DIAGNOSIS — I340 Nonrheumatic mitral (valve) insufficiency: Secondary | ICD-10-CM | POA: Diagnosis not present

## 2022-03-07 DIAGNOSIS — M6259 Muscle wasting and atrophy, not elsewhere classified, multiple sites: Secondary | ICD-10-CM | POA: Diagnosis not present

## 2022-03-07 DIAGNOSIS — E785 Hyperlipidemia, unspecified: Secondary | ICD-10-CM | POA: Diagnosis not present

## 2022-03-07 DIAGNOSIS — R531 Weakness: Secondary | ICD-10-CM | POA: Diagnosis not present

## 2022-03-07 DIAGNOSIS — Z7982 Long term (current) use of aspirin: Secondary | ICD-10-CM | POA: Diagnosis not present

## 2022-03-07 DIAGNOSIS — R2681 Unsteadiness on feet: Secondary | ICD-10-CM | POA: Diagnosis not present

## 2022-03-07 DIAGNOSIS — I5032 Chronic diastolic (congestive) heart failure: Secondary | ICD-10-CM | POA: Diagnosis not present

## 2022-03-07 DIAGNOSIS — I081 Rheumatic disorders of both mitral and tricuspid valves: Secondary | ICD-10-CM | POA: Diagnosis not present

## 2022-03-07 DIAGNOSIS — Z923 Personal history of irradiation: Secondary | ICD-10-CM | POA: Diagnosis not present

## 2022-03-07 DIAGNOSIS — I50812 Chronic right heart failure: Secondary | ICD-10-CM | POA: Diagnosis not present

## 2022-03-07 DIAGNOSIS — I272 Pulmonary hypertension, unspecified: Secondary | ICD-10-CM | POA: Diagnosis not present

## 2022-03-07 DIAGNOSIS — M6281 Muscle weakness (generalized): Secondary | ICD-10-CM | POA: Diagnosis not present

## 2022-03-07 DIAGNOSIS — E1169 Type 2 diabetes mellitus with other specified complication: Secondary | ICD-10-CM | POA: Diagnosis not present

## 2022-03-07 DIAGNOSIS — Z79899 Other long term (current) drug therapy: Secondary | ICD-10-CM | POA: Diagnosis not present

## 2022-03-07 DIAGNOSIS — I2729 Other secondary pulmonary hypertension: Secondary | ICD-10-CM | POA: Diagnosis not present

## 2022-03-07 DIAGNOSIS — I5031 Acute diastolic (congestive) heart failure: Secondary | ICD-10-CM | POA: Diagnosis not present

## 2022-03-07 DIAGNOSIS — N1832 Chronic kidney disease, stage 3b: Secondary | ICD-10-CM | POA: Diagnosis not present

## 2022-03-07 DIAGNOSIS — I350 Nonrheumatic aortic (valve) stenosis: Secondary | ICD-10-CM | POA: Diagnosis not present

## 2022-03-07 DIAGNOSIS — D7589 Other specified diseases of blood and blood-forming organs: Secondary | ICD-10-CM | POA: Diagnosis not present

## 2022-03-07 DIAGNOSIS — Z7984 Long term (current) use of oral hypoglycemic drugs: Secondary | ICD-10-CM | POA: Diagnosis not present

## 2022-03-07 DIAGNOSIS — E1122 Type 2 diabetes mellitus with diabetic chronic kidney disease: Secondary | ICD-10-CM | POA: Diagnosis not present

## 2022-03-07 DIAGNOSIS — I10 Essential (primary) hypertension: Secondary | ICD-10-CM | POA: Diagnosis not present

## 2022-03-07 DIAGNOSIS — I5033 Acute on chronic diastolic (congestive) heart failure: Secondary | ICD-10-CM | POA: Diagnosis not present

## 2022-03-07 DIAGNOSIS — Z741 Need for assistance with personal care: Secondary | ICD-10-CM | POA: Diagnosis not present

## 2022-03-07 DIAGNOSIS — I50810 Right heart failure, unspecified: Secondary | ICD-10-CM | POA: Diagnosis not present

## 2022-03-07 DIAGNOSIS — G9341 Metabolic encephalopathy: Secondary | ICD-10-CM | POA: Diagnosis not present

## 2022-03-07 LAB — BASIC METABOLIC PANEL
Anion gap: 11 (ref 5–15)
BUN: 54 mg/dL — ABNORMAL HIGH (ref 8–23)
CO2: 26 mmol/L (ref 22–32)
Calcium: 8.8 mg/dL — ABNORMAL LOW (ref 8.9–10.3)
Chloride: 100 mmol/L (ref 98–111)
Creatinine, Ser: 1.41 mg/dL — ABNORMAL HIGH (ref 0.61–1.24)
GFR, Estimated: 48 mL/min — ABNORMAL LOW (ref >60)
Glucose, Bld: 96 mg/dL (ref 70–99)
Potassium: 4.6 mmol/L (ref 3.5–5.1)
Sodium: 137 mmol/L (ref 135–145)

## 2022-03-07 LAB — GLUCOSE, CAPILLARY
Glucose-Capillary: 72 mg/dL (ref 70–99)
Glucose-Capillary: 239 mg/dL — ABNORMAL HIGH (ref 70–99)

## 2022-03-07 LAB — DIGOXIN LEVEL: Digoxin Level: 1 ng/mL (ref 0.8–2.0)

## 2022-03-07 MED ORDER — ADULT MULTIVITAMIN W/MINERALS CH: 1.0000 | ORAL_TABLET | Freq: Every day | ORAL | 0 refills | Status: AC

## 2022-03-07 MED ORDER — GLUCERNA SHAKE PO LIQD: 237.0000 mL | Freq: Three times a day (TID) | ORAL | 0 refills | Status: AC

## 2022-03-07 MED ORDER — DIGOXIN 125 MCG PO TABS: 0.0625 mg | ORAL_TABLET | Freq: Every day | ORAL | 0 refills | Status: DC

## 2022-03-07 MED ORDER — DIGOXIN 125 MCG PO TABS: 0.1250 mg | ORAL_TABLET | Freq: Every day | ORAL | 0 refills | Status: DC

## 2022-03-07 MED ORDER — FUROSEMIDE 40 MG PO TABS: 40.0000 mg | ORAL_TABLET | Freq: Two times a day (BID) | ORAL | 0 refills | Status: DC

## 2022-03-07 NOTE — TOC Transition Note (Addendum)
Transition of Care (TOC) - CM/SW Discharge Note ? ? ?Patient Details  ?Name: [name] ?MRN: [id number] ?Date of Birth: 1933/05/19 ? ?Transition of Care (TOC) CM/SW Contact:  ?Reece Agar, LCSWA ?Phone Number: ?03/07/2022, 11:25 AM ? ? ?Clinical Narrative:    ?Patient will DC to: [facility] ?Anticipated DC date: 03/07/2022 ?Family notified: Pt Daughter  ?Transport by: Pt daughter ? ? ?Per MD patient ready for DC to [facility]. RN to call report prior to discharge [phone]). RN, patient, patient's family, and facility notified of DC. Discharge Summary and FL2 sent to facility. DC packet on chart.  ? ?CSW will sign off for now as social work intervention is no longer needed. Please consult [place] again if new needs arise. ?  ? ? ?  ?Barriers to Discharge: Continued Medical Work up, SNF Pending bed offer, Insurance Authorization ? ? ?Patient Goals and CMS Choice ?  ?CMS Medicare.gov Compare Post Acute Care list provided to:: Patient Represenative (must comment) (Daughter) ?  ? ?Discharge Placement ?  ?           ?  ?  ?  ?  ? ?Discharge Plan and Services ?  ?  ?           ?  ?  ?  ?  ?  ?  ?  ?  ?  ?  ? ?Social Determinants of Health (SDOH) Interventions ?Food Insecurity Interventions: Intervention Not Indicated ?Housing Interventions: Intervention Not Indicated ?Transportation Interventions: Intervention Not Indicated ? ? ?Readmission Risk Interventions ?   ? View : No data to display.  ?  ?  ?  ? ? ? ? ? ?

## 2022-03-07 NOTE — Assessment & Plan Note (Signed)
AKI on Ckd stage 2 ? ?Renal function at the time of discharge with a serum cr of 1,41, with K at 4,6 and serum bicarbonate at 26. ?Plan to continue diuresis with furosemide and follow up renal function as outpatient. ?Hold on lisinopril until renal function more stable.  ?

## 2022-03-07 NOTE — Progress Notes (Incomplete)
Heart Failure Stewardship Pharmacist Progress Note ? ? ?PCP: [name], MD ?PCP-Cardiologist: [name], MD  ? ?HPI:  ?*** ?SCr up 1.23 > 1.41 (baseline 0.9) ?Good uop yeaserday 3.6L on LSX 40 bid ?Dig added 5/2 > level on Monday ? ?Get a mag ?Reweigh - up 6 lb since admission but net neg 12.4L ? ?Current HF Medications: ?Diuretic: furosemide 40 mg twice daily ?Beta Blocker: metoprolol succinate 12.5 mg daily  ?SGLT2i: dapagliflozin 10 mg daily ?Other: digoxin 0.125 mg once daily ? ?Prior to admission HF Medications: ?Diuretic: torsemide 40 mg twice daily ?Beta blocker: metoprolol succinate 12.5 mg daily ?ACE/ARB/ARNI: lisinopril 5 mg daily ?SGLT2i: dapagliflozin 10 mg daily ? ?Pertinent Lab Values: ?Serum creatinine 1.41 (up), BUN 54, Potassium 4.6, Sodium 137, BNP 2092.5, Magnesium ***, A1c 5.4%, Digoxin pending level ***  ? ?Vital Signs: ?Weight: 147 lbs (admission weight: 141 lbs) ?Blood pressure: ***  ?Heart rate: ***  ?I/O: -***L yesterday; net -12.4L ? ?Medication Assistance / Insurance Benefits Check: ?Does the patient have prescription insurance?  {Yes/No/Pending:24180} ?Type of insurance plan: *** ? ?Does the patient qualify for medication assistance through manufacturers or grants?   {Yes/No/Pending:24180} ?Eligible grants and/or patient assistance programs: *** ?Medication assistance applications in progress: ***  ?Medication assistance applications approved: *** ?Approved medication assistance renewals will be completed by: *** ? ?Outpatient Pharmacy:  ?Prior to admission outpatient pharmacy: *** ?Is the patient willing to use [facility] pharmacy at discharge? {Yes/No/Pending:24180} ?Is the patient willing to transition their outpatient pharmacy to utilize a [facility] outpatient pharmacy?   {Yes/No/Pending:24180} ?  ? ?Assessment: ?1. Acute on ***chronic ***systolic CHF (LVEF ***%), due to ***. NYHA class *** symptoms. ?-  ?  ?Plan: ?1) Medication changes recommended at this  time: ?- ? ?2) Patient assistance: ?- ? ?3)  Education  ?- To be completed prior to discharge ? ?Kerby Nora, PharmD, BCPS ?Heart Failure Stewardship Pharmacist ?Phone [phone] ? ? ?

## 2022-03-07 NOTE — Progress Notes (Addendum)
Daughter to transport pt. Discharge packet given to daughter. ?Report given to receiving facility. ?

## 2022-03-07 NOTE — Progress Notes (Signed)
Physical Therapy Treatment ?Patient Details ?Name: [name] ?MRN: [id number] ?DOB: Dec 30, 1932 ?Today's Date: 03/07/2022 ? ? ?History of Present Illness 88 y.o. M adm 4/28 with sepsis, CHF exacerbation and encephalopathy. PMhx: HFpEF, AS s/p TAVR, CAD, moderate mitral regurgitation, type 2 diabetes, HTN, persistent Afib, PAD, CKD 3b, GIB, prostate CA. ? ?  ?PT Comments  ? ? The pt was agreeable to session with focus on progression of OOB mobility and endurance training. The pt was able to complete short transfer from recliner to [facility] with supervision, and then completed hallway ambulation with minA to steady with RW. The pt did not take seated or standing rest, but was able to self-monitor for exertion. He initially needed minA to rise from recliner, but improved to minG with repeated transitions from [facility]. He continues to present with poor static and dynamic standing balance, dependent on BUE support and minA to maintain upright balance at this time. Continue to recommend SNF rehab at d/c to maximize functional independence and endurance.  ?   ?Recommendations for follow up therapy are one component of a multi-disciplinary discharge planning process, led by the attending physician.  Recommendations may be updated based on patient status, additional functional criteria and insurance authorization. ? ?Follow Up Recommendations ? Skilled nursing-short term rehab (<3 hours/day) ?  ?  ?Assistance Recommended at Discharge Frequent or constant Supervision/Assistance  ?Patient can return home with the following A little help with walking and/or transfers;A little help with bathing/dressing/bathroom;Assistance with cooking/housework;Direct supervision/assist for financial management;Assist for transportation;Direct supervision/assist for medications management ?  ?Equipment Recommendations ? None recommended by PT  ?  ?Recommendations for Other Services   ? ? ?  ?Precautions / Restrictions Precautions ?Precautions:  Fall ?Restrictions ?Weight Bearing Restrictions: No  ?  ? ?Mobility ? Bed Mobility ?  ?  ?  ?  ?  ?  ?  ?General bed mobility comments: pt received/remained in recliner ?  ? ?Transfers ?Overall transfer level: Needs assistance ?Equipment used: Rolling walker (2 wheels), None ?Transfers: Sit to/from Stand ?Sit to Stand: Min guard, Min assist ?  ?  ?  ?  ?  ?General transfer comment: minA initially to stand from recliner, minG for reps 2-4 from [facility]. pt slow to rise with heavy dependence on UE and poor stability while reaching forward for DME ?  ? ?Ambulation/Gait ?Ambulation/Gait assistance: Min assist ?Gait Distance (Feet): 75 Feet ?Assistive device: Rolling walker (2 wheels) ?Gait Pattern/deviations: Step-through pattern, Decreased stride length, Trunk flexed, Antalgic ?Gait velocity: decreased ?Gait velocity interpretation: <1.31 ft/sec, indicative of household ambulator ?  ?General Gait Details: flexed trunk with collapse of R hip with LLE stance. no overt LOB but very undteady ? ? ? ?  ?Balance Overall balance assessment: Needs assistance ?Sitting-balance support: No upper extremity supported, Feet supported ?Sitting balance-Leahy Scale: Good ?Sitting balance - Comments: static sitting without assist ?  ?Standing balance support: Bilateral upper extremity supported, Reliant on assistive device for balance ?Standing balance-Leahy Scale: Poor ?Standing balance comment: dependent on BUE support for gait and static stance, unable to let go for toileting ?  ?  ?  ?  ?  ?  ?  ?  ?  ?  ?  ?  ? ?  ?Cognition Arousal/Alertness: Awake/alert ?Behavior During Therapy: [facility] for tasks assessed/performed ?Overall Cognitive Status: Impaired/Different from baseline ?Area of Impairment: Safety/judgement ?  ?  ?  ?  ?  ?  ?  ?  ?  ?  ?  ?  ?Safety/Judgement:  Decreased awareness of safety, Decreased awareness of deficits ?  ?  ?General Comments: pt needing cues for safety, but was able to follow all instructions and demo good self  monitoring of exertion. Pt also with good awarenss of deficits and memory of goals of care/plan ?  ?  ? ?  ?Exercises General Exercises - Lower Extremity ?Toe Raises: AROM, Both, 20 reps, Seated ?Heel Raises: AROM, Both, 20 reps, Seated ? ?  ?General Comments   ?  ?  ? ?Pertinent Vitals/Pain Pain Assessment ?Pain Assessment: Faces ?Faces Pain Scale: Hurts little more ?Pain Location: bottom, open wounds on sacrum, painful when sitting or with pressure ?Pain Descriptors / Indicators: Aching, Sore ?Pain Intervention(s): Limited activity within patient's tolerance, Monitored during session, Repositioned  ? ? ? ?PT Goals (current goals can now be found in the care plan section) Acute Rehab PT Goals ?Patient Stated Goal: return home ?PT Goal Formulation: With patient ?Time For Goal Achievement: 03/16/22 ?Potential to Achieve Goals: Fair ?Progress towards PT goals: Progressing toward goals ? ?  ?Frequency ? ? ? Min 3X/week ? ? ? ?  ?PT Plan Current plan remains appropriate  ? ? ?   ?AM-PAC PT "6 Clicks" Mobility   ?Outcome Measure ? Help needed turning from your back to your side while in a flat bed without using bedrails?: A Little ?Help needed moving from lying on your back to sitting on the side of a flat bed without using bedrails?: A Little ?Help needed moving to and from a bed to a chair (including a wheelchair)?: A Little ?Help needed standing up from a chair using your arms (e.g., wheelchair or bedside chair)?: A Lot (mod cues for technique and modA to stand without RW) ?Help needed to walk in hospital room?: A Little ?Help needed climbing 3-5 steps with a railing? : Total ?6 Click Score: 15 ? ?  ?End of Session Equipment Utilized During Treatment: Gait belt ?Activity Tolerance: Patient tolerated treatment well ?Patient left: in chair;with call bell/phone within reach;with chair alarm set ?Nurse Communication: Mobility status ?PT Visit Diagnosis: Other abnormalities of gait and mobility (R26.89);Difficulty in  walking, not elsewhere classified (R26.2);Muscle weakness (generalized) (M62.81) ?  ? ? ?Time: 2947-6546 ?PT Time Calculation (min) (ACUTE ONLY): 27 min ? ?Charges:  $Gait Training: 8-22 mins ?$Therapeutic Exercise: 8-22 mins          ?          ? ?West Carbo, PT, DPT  ? ?Acute Rehabilitation Department ?Pager #: [phone] - [phone] ? ? ?Sandra Cockayne ?03/07/2022, 10:39 AM ? ?

## 2022-03-07 NOTE — Progress Notes (Signed)
Mobility Specialist Progress Note: ? ? 03/07/22 1010  ?Mobility  ?Activity Transferred from bed to chair  ?Range of Motion/Exercises Active;Right leg;Left leg  ?Level of Assistance Minimal assist, patient does 75% or more  ?Assistive Device Front wheel walker  ?Distance Ambulated (ft) 2 ft  ?Activity Response Tolerated well  ?$Mobility charge 1 Mobility  ? ?Pt agreeable to mobility session. Required minA throughout transfer. Performed multiple exercises once in chair. Feet elevated upon departure, chair alarm on. ? ?Nelta Numbers ?Acute Rehab ?Phone: [phone] ?Office Phone: [phone] ? ?

## 2022-03-07 NOTE — Discharge Summary (Addendum)
?Physician Discharge Summary ?  ?Patient: [name] MRN: [id number] DOB: Jan 21, 1933  ?Admit date:     03/01/2022  ?Discharge date: 03/07/22  ?Discharge Physician: Jimmy Picket Decari Duggar  ? ?PCP: [name], MD  ? ?Recommendations at discharge:  ? ? Torsemide changed to furosemide 40 mg po bid ?Started on digoxin, follow levels as outpatient ?Follow up renal function and electrolytes in 7 days ?Holding lisinopril or RAS inhibition until renal function more stable.  ? ?I spoke over the phone with the patient's daughter about patient's  condition, plan of care, prognosis and all questions were addressed.  ? ? ?Discharge Diagnoses: ?Principal Problem: ?  Heart failure with preserved ejection fraction ([place]) ?Active Problems: ?  Essential hypertension ?  Chronic atrial fibrillation with RVR (H[facility]) ?  Type 2 diabetes mellitus with hyperlipidemia ([place]) ?  Prostate cancer ([place]) ?  Unspecified protein-calorie malnutrition ([place]) ?  Acute metabolic encephalopathy ?  CKD (chronic kidney disease) stage 2, GFR 60-89 ml/min ? ?Resolved Problems: ?  * No resolved hospital problems. * ? ?Hospital Course: ?Mr. Rippeon was admitted to the hospital with the working diagnosis of decompensated heart failure.  ? ?88 yo male with the past medical history of cad, severe AS sp TAVR, T2DM, hypertension CKD and atrial fibrillation who presented with failure to thrive. As outpatient his torsemide was increased due to signs of hypervolemia, his volume status improved but he developed altered mentation, prompting his family to bring him to the hospital. On his initial physical examination his blood pressure was 97/71, 115/65. HR 93 to 115 and RR 17 to 30. Lungs with no rales or wheezing, heart with S1 and S2 present irregularly irregular, abdomen soft and no lower extremity edema.  ? ?Na 142, K 4.0 Cl 99, bicarbonate 30 glucose 219, bun 34, cr 1,46 ?BNP 2,092 ?High sensitive troponin 26 and 31  ?Wbc 8.7 hgb 10.6 plt 186  ?UA SG 1.012 >500  glucose.  ? ?Chest radiograph with cardiomegaly, bilateral interstitial infiltrates predominantly at the lower lobes, small right pleural effusion.  ? ?EKG 114 bpm, left axis deviation, normal qtc, atrial fibrillation with poor R wave progression, with no significant ST segment changes and negative T wave lead I and AvL.  ?  ?Patient was placed on furosemide for diuresis with good toleration.  ?Rate control atrial fibrillation with digoxin. ?Continue diuresis with oral furosemide and close follow up as outpatient.  ? ?Assessment and Plan: ?* Heart failure with preserved ejection fraction ([place]) ?Patient was admitted to the cardiac ward and was placed on IV furosemide for diuresis, negative fluid balance was achieved, - 9,383 ml, with significant improvement in his symptoms.  ? ?Echocardiogram with preserved LV systolic function EF 55 to 60% with moderate reduction in RV systolic function, Severe elevated pulmonary artery systolic pressure, bilateral atrial dilatation. Severe TR. Aortic valve st TAVR.  ? ?Pulmonary hypertension with acute on chronic core pulmonale.  ? ?Continue medical therapy with digoxin, dapagliflozin and metoprolol.  ?Diuresis with furosemide 40 mg po bid.  ?Follow up as outpatient.  ? ?Chronic atrial fibrillation with RVR ([place]) ?Rate control with metoprolol and digoxin  ?Telemetry with HR in the 60 range with good toleration.  ?Currently off anticoagulation due to history of GI bleed in the past. ?Continue with aspirin.  ? ?CKD (chronic kidney disease) stage 2, GFR 60-89 ml/min ?AKI on Ckd stage 2 ? ?Renal function at the time of discharge with a serum cr of 1,41, with K at 4,6 and serum  bicarbonate at 26. ?Plan to continue diuresis with furosemide and follow up renal function as outpatient. ?Hold on lisinopril until renal function more stable.  ? ?Essential hypertension ?Continue blood pressure control with metoprolol.  ?Systolic blood pressure at the time of discharge 109 to 123 mmHg.  ? ?Type  2 diabetes mellitus with hyperlipidemia ([place]) ?Patient was placed on insulin therapy, sliding scale and basal for glucose cover and monitoring.  ?Patient is tolerating po well.  ? ?Continue with statin therapy.  ? ? ?Prostate cancer ([place]) ?Follow up as outpatient  ? ?Unspecified protein-calorie malnutrition ([place]) ?Continue nutritional supplements.  ? ?Acute metabolic encephalopathy ?Clinically resolved.  ? ? ? ? ?  ? ? ?Consultants: cardiology  ?Procedures performed: none   ?Disposition: Skilled nursing facility ?Diet recommendation:  ?Cardiac diet ?DISCHARGE MEDICATION: ?Allergies as of 03/07/2022   ? ?   Reactions  ? Amoxicillin Other (See Comments)  ? Burning of mouth, tongue and lips. ? itchy, burning throat  ? ?  ? ?  ?Medication List  ?  ? ?STOP taking these medications   ? ?Gerhardt's butt cream Crea ?  ?lisinopril 5 MG tablet ?Commonly known as: ZESTRIL ?  ?potassium chloride SA 20 MEQ tablet ?Commonly known as: [place]-Con M20 ?  ?Pro-Stat Liqd ?  ?PSYLLIUM PO ?  ?torsemide 20 MG tablet ?Commonly known as: DEMADEX ?  ?vitamin C 500 MG tablet ?Commonly known as: ASCORBIC ACID ?  ? ?  ? ?TAKE these medications   ? ?acetaminophen 650 MG CR tablet ?Commonly known as: TYLENOL ?Take 650 mg by mouth daily as needed for pain. ?  ?aspirin EC 81 MG tablet ?Take 1 tablet (81 mg total) by mouth daily. Swallow whole. ?  ?B Complex 50 Tabs ?Take 1 tablet by mouth daily with breakfast. ?  ?bicalutamide 50 MG tablet ?Commonly known as: CASODEX ?Take 50 mg by mouth See admin instructions. Take 50 mg by mouth in the afternoon ?  ?cetirizine 10 MG tablet ?Commonly known as: ZYRTEC ?Take 1 tablet (10 mg total) by mouth daily. ?  ?cholecalciferol 25 MCG (1000 UNIT) tablet ?Commonly known as: VITAMIN D3 ?Take 1,000 Units by mouth 2 (two) times daily with a meal. ?  ?dapagliflozin propanediol 10 MG Tabs tablet ?Commonly known as: FARXIGA ?Take 10 mg by mouth daily. ?  ?digoxin 0.125 MG tablet ?Commonly known as: LANOXIN ?Take 0.5  tablets (0.0625 mg total) by mouth daily. ?Start taking on: Mar 08, 2022 ?  ?feeding supplement (GLUCERNA SHAKE) Liqd ?Take 237 mLs by mouth 3 (three) times daily between meals. ?  ?ferrous sulfate 325 (65 FE) MG tablet ?Take 1 tablet (325 mg total) by mouth 2 (two) times daily with a meal. ?  ?fluticasone 50 MCG/ACT nasal spray ?Commonly known as: FLONASE ?Place 1 spray into both nostrils daily. ?  ?furosemide 40 MG tablet ?Commonly known as: LASIX ?Take 1 tablet (40 mg total) by mouth 2 (two) times daily. ?  ?gabapentin 100 MG capsule ?Commonly known as: Neurontin ?Take 1 capsule (100 mg total) by mouth at bedtime. ?  ?metFORMIN 1000 MG tablet ?Commonly known as: GLUCOPHAGE ?Take 1 tablet (1,000 mg total) by mouth 2 (two) times daily with breakfast and lunch. ?  ?metoprolol succinate 25 MG 24 hr tablet ?Commonly known as: TOPROL-XL ?Take 0.5 tablets (12.5 mg total) by mouth daily. ?  ?multivitamin with minerals Tabs tablet ?Take 1 tablet by mouth daily. ?Start taking on: Mar 08, 2022 ?  ?OVER THE COUNTER MEDICATION ?Take 200 mg by  mouth daily. Selenium 200 mg ?  ?OXYGEN ?2L/min via [place] continuously ?  ?pantoprazole 40 MG tablet ?Commonly known as: PROTONIX ?Take 1 tablet (40 mg total) by mouth daily. Take  1 tab of 40 mg tab  by mouth twice a day till 02/16/22 then 1 tab of 40 mg by mouth daily starting 02/17/22 ?What changed: additional instructions ?  ?rosuvastatin 5 MG tablet ?Commonly known as: CRESTOR ?TAKE 1 TABLET BY MOUTH ONCE DAILY AT  6PM ?What changed:  ?how much to take ?how to take this ?when to take this ?additional instructions ?  ?VITAMIN A PO ?Take 1 tablet by mouth daily. ?  ?vitamin B-12 1000 MCG tablet ?Commonly known as: CYANOCOBALAMIN ?Take 1 tablet (1,000 mcg total) by mouth 2 (two) times daily with a meal. ?  ? ?  ? ?  ?  ? ? ?  ?Discharge Care Instructions  ?(From admission, onward)  ?  ? ? ?  ? ?  Start     Ordered  ? 03/07/22 0000  Discharge wound care:       ?Comments: Wound care to Right  LE:  Cleanse with NS, pat dry. Cover LE with size appropriate piece of silver hydrofiber (Aquacel Ag+ Advantage, Kellie Simmering # [id number]). Top with dry gauze, secure by wrapping from just below toes to just below k

## 2022-03-08 ENCOUNTER — Non-Acute Institutional Stay (SKILLED_NURSING_FACILITY): Payer: Medicare Other | Admitting: Adult Health

## 2022-03-08 ENCOUNTER — Encounter: Payer: Self-pay | Admitting: Adult Health

## 2022-03-08 DIAGNOSIS — I10 Essential (primary) hypertension: Secondary | ICD-10-CM | POA: Diagnosis not present

## 2022-03-08 DIAGNOSIS — I5032 Chronic diastolic (congestive) heart failure: Secondary | ICD-10-CM | POA: Diagnosis not present

## 2022-03-08 DIAGNOSIS — I482 Chronic atrial fibrillation, unspecified: Secondary | ICD-10-CM

## 2022-03-08 DIAGNOSIS — R531 Weakness: Secondary | ICD-10-CM

## 2022-03-08 DIAGNOSIS — E1149 Type 2 diabetes mellitus with other diabetic neurological complication: Secondary | ICD-10-CM | POA: Diagnosis not present

## 2022-03-08 NOTE — Progress Notes (Signed)
? ?Location:  [facility] ?Nursing Home Room Number: [id number] ?Place of Service:  SNF (31) ?Provider:  Durenda Age, DNP, FNP-BC ? ?Patient Care Team: ?[name], MD as PCP - General (Family Medicine) ?[name], MD as PCP - Cardiology (Cardiology) ?Sherren Mocha, MD as Attending Physician (Cardiology) ?[name], MD (Inactive) as Attending Physician (Cardiothoracic Surgery) ?[name], MD (Inactive) as Attending Physician (Cardiothoracic Surgery) ? ?Extended Emergency Contact Information ?Primary Emergency Contact: Dennis,Sherrie Ann ?         [place], [place] [address] [place] ?Home Phone: [phone] ?Mobile Phone: [phone] ?Relation: Daughter ?Secondary Emergency Contact: Zayven, Powe ?Address: [address]         [place], [place] [address] [place] ?Home Phone: [phone] ?Mobile Phone: [phone] ?Relation: Spouse ? ?Code Status:  DNR ? ?Goals of care: Advanced Directive information ? ?  03/08/2022  ? 10:01 AM  ?Advanced Directives  ?Does Patient Have a Medical Advance Directive? Yes  ?Type of Paramedic of [place];Out of facility DNR (pink MOST or yellow form)  ?Does patient want to make changes to medical advance directive? No - Guardian declined  ?Copy of [place] in Chart? Yes - validated most recent copy scanned in chart (See row information)  ?Pre-existing out of facility DNR order (yellow form or pink MOST form) Yellow form placed in chart (order not valid for inpatient use)  ? ? ? ?Chief Complaint  ?Patient presents with  ? Hospitalization Follow-up  ?  Patient is here for hospital follow up  ? ? ?HPI:  ?Pt is a 88 y.o. male who was admitted to [facility] on 03/07/22 post hospital admission 03/01/2022 through 03/07/22.  He has a PMH of CAD, severe AS S/P TAVR, type 2 diabetes mellitus, hypertension, chronic kidney disease and atrial fibrillation.   He was brought to the hospital by family due to altered mentation.  His torsemide dosage was recently increased due to signs of hypervolemia.  He was placed on furosemide for diuresis ? ?He was admitted to [facility] for short-term rehabilitation. ? ?Past Medical History:  ?Diagnosis Date  ? Anemia   ? LOW PLATELETS OTHER DAY  PER PT  ? Anticoagulated on Coumadin 12/21/2012  ? Aortic stenosis   ? Arthritis   ? "left wrist; back sometimes" (01/21/2013)  ? Atrial fibrillation ([place])   ? [name], DR Burt Knack  ? Bradycardia   ? Exertional shortness of breath   ? Heart murmur   ? "I've had it for years; runs in the family on daddy's side" (01/21/2013)  ? HTN (hypertension)   ? Hyperlipemia 12/21/2012  ? Prostate cancer ([place])   ? "had 40 tx of radiation in 2009" (01/21/2013)  ? S/P aortic valve replacement with bioprosthetic valve 01/19/2013  ? Transcatheter Aortic Valve Replacement using 50m Sapien bioprosthetic tissue valve via transapical approach  ? [place]   ? ?Past Surgical History:  ?Procedure Laterality Date  ? BALLOON DILATION N/A 08/02/2020  ? Procedure: BALLOON DILATION;  Surgeon: [name] MD;  [facility];  Service: Gastroenterology;  Laterality: N/A;  ? BIOPSY  10/03/2021  ? Procedure: BIOPSY;  Surgeon: [name] MD;  [facility];  Service: Endoscopy;;  ? BIOPSY  12/14/2021  ? Procedure: BIOPSY;  Surgeon: [name] MD;  [facility]  Service: Endoscopy;;  ? CARDIAC CATHETERIZATION  12/16/2012  ? CARDIAC VALVE REPLACEMENT  01/19/2013  ? AVR  ?  CATARACT EXTRACTION W/ INTRAOCULAR LENS  IMPLANT, BILATERAL    ? ESOPHAGOGASTRODUODENOSCOPY N/A 10/03/2021  ? Procedure: ESOPHAGOGASTRODUODENOSCOPY (EGD);  Surgeon: [name], MD;  [facility];  Service: Endoscopy;  Laterality: N/A;  ? ESOPHAGOGASTRODUODENOSCOPY N/A 12/14/2021  ? Procedure: ESOPHAGOGASTRODUODENOSCOPY (EGD);  Surgeon: Wilford Corner, MD;  Location: [place];  Service:  Endoscopy;  Laterality: N/A;  ? ESOPHAGOGASTRODUODENOSCOPY (EGD) WITH PROPOFOL N/A 08/02/2020  ? Procedure: ESOPHAGOGASTRODUODENOSCOPY (EGD) WITH PROPOFOL;  Surgeon: [name], MD;  [facility];  Service: Gastroenterology;  Laterality: N/A;  ? EYE SURGERY    ? INTRAOPERATIVE TRANSESOPHAGEAL ECHOCARDIOGRAM N/A 01/19/2013  ? Procedure: INTRAOPERATIVE TRANSESOPHAGEAL ECHOCARDIOGRAM;  Surgeon: [name], MD;  Location: [place];  Service: Open Heart Surgery;  Laterality: N/A;  ? ORIF FOREARM FRACTURE Left 1954  ? "compound fx" (01/21/2013)  ? TRANSTHORACIC ECHOCARDIOGRAM  09/04/10, 09/07/08  ? ? ?Allergies  ?Allergen Reactions  ? Amoxicillin Other (See Comments)  ?  Burning of mouth, tongue and lips. ? itchy, burning throat  ? ? ?Outpatient Encounter Medications as of 03/08/2022  ?Medication Sig  ? acetaminophen (TYLENOL) 650 MG CR tablet Take 650 mg by mouth daily as needed for pain.  ? aspirin EC 81 MG tablet Take 1 tablet (81 mg total) by mouth daily. Swallow whole.  ? bicalutamide (CASODEX) 50 MG tablet Take 50 mg by mouth See admin instructions. Take 50 mg by mouth in the afternoon  ? cholecalciferol (VITAMIN D3) 25 MCG (1000 UNIT) tablet Take 1,000 Units by mouth 2 (two) times daily with a meal.  ? dapagliflozin propanediol (FARXIGA) 10 MG TABS tablet Take 10 mg by mouth daily.  ? feeding supplement, GLUCERNA SHAKE, (GLUCERNA SHAKE) LIQD Take 237 mLs by mouth 3 (three) times daily between meals.  ? ferrous sulfate 325 (65 FE) MG tablet Take 1 tablet (325 mg total) by mouth 2 (two) times daily with a meal.  ? fluticasone (FLONASE) 50 MCG/ACT nasal spray Place 1 spray into both nostrils daily.  ? furosemide (LASIX) 40 MG tablet Take 1 tablet (40 mg total) by mouth 2 (two) times daily.  ? gabapentin (NEURONTIN) 100 MG capsule Take 1 capsule (100 mg total) by mouth at bedtime.  ? metFORMIN (GLUCOPHAGE) 1000 MG tablet Take 1 tablet (1,000 mg total) by mouth 2 (two) times daily with breakfast and lunch.  ?  Multiple Vitamin (MULTIVITAMIN WITH MINERALS) TABS tablet Take 1 tablet by mouth daily.  ? rosuvastatin (CRESTOR) 5 MG tablet TAKE 1 TABLET BY MOUTH ONCE DAILY AT  6PM  ? vitamin B-12 (CYANOCOBALAMIN) 1000 MCG tablet Take 1 tablet (1,000 mcg total) by mouth 2 (two) times daily with a meal.  ? [DISCONTINUED] B Complex Vitamins (B COMPLEX 50) TABS Take 1 tablet by mouth daily with breakfast.  ? [DISCONTINUED] cetirizine (ZYRTEC) 10 MG tablet Take 1 tablet (10 mg total) by mouth daily.  ? [DISCONTINUED] digoxin (LANOXIN) 0.125 MG tablet Take 0.5 tablets (0.0625 mg total) by mouth daily.  ? [DISCONTINUED] OXYGEN 2L/min via [place] continuously  ? [DISCONTINUED] pantoprazole (PROTONIX) 40 MG tablet Take 1 tablet (40 mg total) by mouth daily. Take  1 tab of 40 mg tab  by mouth twice a day till 02/16/22 then 1 tab of 40 mg by mouth daily starting 02/17/22  ? metoprolol succinate (TOPROL-XL) 25 MG 24 hr tablet Take 0.5 tablets (12.5 mg total) by mouth daily.  ? [DISCONTINUED] OVER THE COUNTER MEDICATION Take 200 mg by mouth daily. Selenium 200 mg  ? [DISCONTINUED] VITAMIN A PO  Take 1 tablet by mouth daily.  ? ?No facility-administered encounter medications on file as of 03/08/2022.  ? ? ?Review of Systems  ?Constitutional:  Negative for activity change, appetite change and fever.  ?HENT:  Negative for sore throat.   ?Eyes: Negative.   ?Cardiovascular:  Negative for chest pain and leg swelling.  ?Gastrointestinal:  Negative for abdominal distention, diarrhea and vomiting.  ?Genitourinary:  Negative for dysuria, frequency and urgency.  ?Skin:  Negative for color change.  ?Neurological:  Negative for dizziness and headaches.  ?Psychiatric/Behavioral:  Negative for behavioral problems and sleep disturbance. The patient is not nervous/anxious.    ? ? ? ?Immunization History  ?Administered Date(s) Administered  ? Influenza,inj,Quad PF,6+ Mos 07/15/2016, 08/01/2017, 07/24/2018, 08/27/2019, 10/05/2020, 07/12/2021  ? Influenza,inj,quad, With  Preservative 07/21/2014, 08/14/2015  ? Influenza-Unspecified 07/05/2012  ? Janssen (J&J) SARS-COV-2 Vaccination 02/13/2020, 09/19/2020  ? Pneumococcal Conjugate PCV 7 08/04/2017  ? Pneumococcal Conjugate-1

## 2022-03-11 ENCOUNTER — Non-Acute Institutional Stay (SKILLED_NURSING_FACILITY): Payer: Medicare Other | Admitting: Internal Medicine

## 2022-03-11 ENCOUNTER — Encounter: Payer: Self-pay | Admitting: Internal Medicine

## 2022-03-11 DIAGNOSIS — E440 Moderate protein-calorie malnutrition: Secondary | ICD-10-CM | POA: Diagnosis not present

## 2022-03-11 DIAGNOSIS — G9341 Metabolic encephalopathy: Secondary | ICD-10-CM

## 2022-03-11 DIAGNOSIS — E1169 Type 2 diabetes mellitus with other specified complication: Secondary | ICD-10-CM

## 2022-03-11 DIAGNOSIS — I5031 Acute diastolic (congestive) heart failure: Secondary | ICD-10-CM | POA: Diagnosis not present

## 2022-03-11 DIAGNOSIS — N182 Chronic kidney disease, stage 2 (mild): Secondary | ICD-10-CM

## 2022-03-11 DIAGNOSIS — E785 Hyperlipidemia, unspecified: Secondary | ICD-10-CM

## 2022-03-11 NOTE — Assessment & Plan Note (Signed)
Current total protein 6 and albumin 3.1.  Nutrition to consult at SNF.  Improvement in these parameters should help his CHF. ?

## 2022-03-11 NOTE — Progress Notes (Signed)
? ?NURSING HOME LOCATION:  [place] ?ROOM NUMBER:  [id number] ? ?CODE STATUS:  DNR ? ?PCP:  [name] MD ? ?This is a comprehensive admission note to this SNFperformed on this date less than 30 days from date of admission. ?Included are preadmission medical/surgical history; reconciled medication list; family history; social history and comprehensive review of systems.  ?Corrections and additions to the records were documented. Comprehensive physical exam was also performed. Additionally a clinical summary was entered for each active diagnosis pertinent to this admission in the Problem List to enhance continuity of care. ? ?HPI: He was hospitalized 4/28 - 03/07/2022 presenting as adult failure to thrive.  Prior to admission torsemide had been increased due to clinical signs of hypervolemia.  Volume status did improve clinically but he developed AMS prompting the ED visit.  Initially blood pressure was 97/71 with tachycardia up to 115.  Tachypnea was present up to 30.  Rhythm and rate were irregular.  CXR revealed cardiomegaly with bilateral interstitial infiltrates predominantly in the lower lobes with a small right pleural effusion.  EKG revealed A-fib with poor R wave progression without definite ACS.  BNP was 2092 and high-sensitivity troponin peaked at 31.  Initial creatinine was 1.46 with a GFR of 46 indicating CKD low stage IIIa.  Total protein was 6 and albumin 3.1 indicating malnutrition.  H/H was 10.6/33.5 with an MCV of 105.3. ?Diuresis was achieved with parenteral furosemide and rate control achieved with digoxin.  He was transitioned to oral furosemide. I & O was greater than -9 L with associated clinical improvement.  Echo revealed preserved LV function with EF 55 to 60% with moderate reduction right ventricular systolic function.  Severely elevated pulmonary artery systolic pressure was present with BAE.  He had severe TR.  Aortic valve was S/P TAVR. ?He was transitioned to oral  digoxin,dapagliflozin and metoprolol. ?At discharge creatinine was 1.41 and GFR 48 indicating stage IIIa.  BUN was 54 indicating some prerenal azotemia.  H/H was 10.3/31.2 with persistent MCV elevation at 103.7.  The most recent B12 on record was 1295 on 10/04/2021. ?During the hospitalization glucoses range from 88 up to 305.  On 12/15/2021 A1c had been prediabetic at 5.4%. ?He was felt to be clinically stable enough to discharge to SNF for rehab. ? ?Past medical and surgical history: Includes essential hypertension, dyslipidemia, history of prostate cancer, and diabetes with CKD. ?Procedures and surgeries include multiple EGDs and aortic valve replacement. ? ?Social history: Not currently drinking; 44-pack-year history of smoking ? ?Family history: Noncontributory due to advanced age. ?  ?Review of systems: He was cognizant of the reason for his admission ,stating that he had "heart failure because 2-3 valves were leaking".  He states that "this takes away my strength".  He did validate that the new pill "dioxide" was helping tremendously in reference to peripheral edema.  He stated that he was "95% improved". ?Other symptoms include chronic constipation.  He also describes numbness of the left index and third fingers.  He does admit to slight depression particularly in reference to the new DNR status. ? ?Constitutional: No fever  ?Eyes: No redness, discharge, pain, vision change ?ENT/mouth: No nasal congestion, purulent discharge, earache, change in hearing, sore throat  ?Cardiovascular: No chest pain, claudication ?Respiratory: No cough, sputum production, hemoptysis, DOE, significant snoring, apnea  ?Gastrointestinal: No heartburn, dysphagia, abdominal pain, nausea /vomiting, rectal bleeding, melena ?Genitourinary: No dysuria, hematuria, pyuria, incontinence, nocturia ?Musculoskeletal: No joint stiffness, joint swelling,  pain ?Dermatologic: No  rash, pruritus, change in appearance of skin ?Neurologic: No  dizziness, headache, syncope, seizures ?Psychiatric: No significant  insomnia, anorexia ?Endocrine: No change in hair/skin/nails, excessive thirst, excessive hunger, excessive urination  ?Hematologic/lymphatic: No significant bruising, lymphadenopathy, abnormal bleeding ?Allergy/immunology: No itchy/watery eyes, significant sneezing, urticaria, angioedema ? ?Physical exam:  ?Pertinent or positive findings: He appears his stated age.  He keeps his neck flexed with the chin on the chest.  Hair is thin over the crown.  He has bilateral ptosis.  Eyebrows are decreased laterally.  His beard and mustache are thin and unkempt.  He has complete dentures.  There is a venous nevus over the lower lip on the left.  S1 is increased and rhythm is irregular.  He has an intermittent brief systolic murmur.  He has minor rales in the left lower lobe with decreased breath sounds in the right lower lobe.  Superiorly breath sounds are somewhat bronchovesicular in character.  Pedal pulses cannot be palpated.  Right shin is dressed surgically.  Both heels are dressed as well.  He does have marked hyperpigmentation over the shins.  There is also marked bruising over the forearms.  There is a large dressing over the right posterior thorax.  He has some interosseous wasting of the hands. ? ?General appearance:  no acute distress, increased work of breathing is present.   ?Lymphatic: No lymphadenopathy about the head, neck, axilla. ?Eyes: No conjunctival inflammation or lid edema is present. There is no scleral icterus. ?Ears:  External ear exam shows no significant lesions or deformities.   ?Nose:  External nasal examination shows no deformity or inflammation. Nasal mucosa are pink and moist without lesions, exudates ?Oral exam: Lips and gums are healthy appearing.There is no oropharyngeal erythema or exudate. ?Neck:  No thyromegaly, masses, tenderness noted.    ?Heart:  No gallop,  click, rub.  ?Lungs:  without wheezes, rhonchi,   rubs. ?Abdomen: Bowel sounds are normal.  Abdomen is soft and nontender with no organomegaly, hernias, masses. ?GU: Deferred  ?Extremities:  No cyanosis, clubbing.Balance, [place], finger to nose testing could not be completed due to clinical state ?Skin: Warm & dry w/o tenting. ? ?See clinical summary under each active problem in the Problem List with associated updated therapeutic plan ? ?

## 2022-03-11 NOTE — Assessment & Plan Note (Addendum)
Glucoses as inpatient 88-305.  A1c 5.4% on 12/15/2021 indicating prediabetes.  With CKD there may be some discordance between glucoses and A1c. ?

## 2022-03-11 NOTE — Assessment & Plan Note (Signed)
4/28 - 03/07/2022 essentially stable CKD stage IIIa with final creatinine of 1.41 and GFR 48. ?

## 2022-03-11 NOTE — Assessment & Plan Note (Addendum)
Presentation as acute adult failure to thrive in the context of acute congestive heart failure with preserved left ventricular ejection fraction.  Currently alert and oriented. ?

## 2022-03-11 NOTE — Assessment & Plan Note (Signed)
Clinically compensated at this time. ?

## 2022-03-11 NOTE — Patient Instructions (Signed)
See assessment and plan under each diagnosis in the problem list and acutely for this visit 

## 2022-03-13 ENCOUNTER — Ambulatory Visit (HOSPITAL_COMMUNITY)
Admit: 2022-03-13 | Discharge: 2022-03-13 | Disposition: A | Discharge status: Home / Self Care | Payer: Medicare Other | Attending: Cardiology | Admitting: Cardiology

## 2022-03-13 ENCOUNTER — Telehealth (HOSPITAL_COMMUNITY): Payer: Self-pay | Admitting: *Deleted

## 2022-03-13 VITALS — BP 138/56 | HR 76 | Wt 146.0 lb

## 2022-03-13 DIAGNOSIS — Z9181 History of falling: Secondary | ICD-10-CM | POA: Diagnosis not present

## 2022-03-13 DIAGNOSIS — Z87891 Personal history of nicotine dependence: Secondary | ICD-10-CM | POA: Insufficient documentation

## 2022-03-13 DIAGNOSIS — I272 Pulmonary hypertension, unspecified: Secondary | ICD-10-CM

## 2022-03-13 DIAGNOSIS — Z7984 Long term (current) use of oral hypoglycemic drugs: Secondary | ICD-10-CM | POA: Diagnosis not present

## 2022-03-13 DIAGNOSIS — N1832 Chronic kidney disease, stage 3b: Secondary | ICD-10-CM | POA: Insufficient documentation

## 2022-03-13 DIAGNOSIS — E1122 Type 2 diabetes mellitus with diabetic chronic kidney disease: Secondary | ICD-10-CM | POA: Insufficient documentation

## 2022-03-13 DIAGNOSIS — Z952 Presence of prosthetic heart valve: Secondary | ICD-10-CM | POA: Insufficient documentation

## 2022-03-13 DIAGNOSIS — Z923 Personal history of irradiation: Secondary | ICD-10-CM | POA: Diagnosis not present

## 2022-03-13 DIAGNOSIS — I2729 Other secondary pulmonary hypertension: Secondary | ICD-10-CM | POA: Diagnosis not present

## 2022-03-13 DIAGNOSIS — I340 Nonrheumatic mitral (valve) insufficiency: Secondary | ICD-10-CM

## 2022-03-13 DIAGNOSIS — I482 Chronic atrial fibrillation, unspecified: Secondary | ICD-10-CM | POA: Diagnosis not present

## 2022-03-13 DIAGNOSIS — I50810 Right heart failure, unspecified: Secondary | ICD-10-CM | POA: Diagnosis not present

## 2022-03-13 DIAGNOSIS — E785 Hyperlipidemia, unspecified: Secondary | ICD-10-CM | POA: Insufficient documentation

## 2022-03-13 DIAGNOSIS — Z79899 Other long term (current) drug therapy: Secondary | ICD-10-CM | POA: Insufficient documentation

## 2022-03-13 DIAGNOSIS — I350 Nonrheumatic aortic (valve) stenosis: Secondary | ICD-10-CM | POA: Insufficient documentation

## 2022-03-13 DIAGNOSIS — I5032 Chronic diastolic (congestive) heart failure: Secondary | ICD-10-CM | POA: Diagnosis not present

## 2022-03-13 DIAGNOSIS — I130 Hypertensive heart and chronic kidney disease with heart failure and stage 1 through stage 4 chronic kidney disease, or unspecified chronic kidney disease: Secondary | ICD-10-CM | POA: Diagnosis not present

## 2022-03-13 DIAGNOSIS — Z7982 Long term (current) use of aspirin: Secondary | ICD-10-CM | POA: Diagnosis not present

## 2022-03-13 DIAGNOSIS — Z8546 Personal history of malignant neoplasm of prostate: Secondary | ICD-10-CM | POA: Insufficient documentation

## 2022-03-13 DIAGNOSIS — K219 Gastro-esophageal reflux disease without esophagitis: Secondary | ICD-10-CM | POA: Diagnosis not present

## 2022-03-13 DIAGNOSIS — I081 Rheumatic disorders of both mitral and tricuspid valves: Secondary | ICD-10-CM | POA: Insufficient documentation

## 2022-03-13 DIAGNOSIS — I50812 Chronic right heart failure: Secondary | ICD-10-CM | POA: Insufficient documentation

## 2022-03-13 LAB — BASIC METABOLIC PANEL
Anion gap: 11 (ref 5–15)
BUN: 45 mg/dL — ABNORMAL HIGH (ref 8–23)
CO2: 31 mmol/L (ref 22–32)
Calcium: 9.2 mg/dL (ref 8.9–10.3)
Chloride: 94 mmol/L — ABNORMAL LOW (ref 98–111)
Creatinine, Ser: 1.21 mg/dL (ref 0.61–1.24)
GFR, Estimated: 58 mL/min — ABNORMAL LOW (ref >60)
Glucose, Bld: 250 mg/dL — ABNORMAL HIGH (ref 70–99)
Potassium: 4.5 mmol/L (ref 3.5–5.1)
Sodium: 136 mmol/L (ref 135–145)

## 2022-03-13 LAB — DIGOXIN LEVEL: Digoxin Level: 1.3 ng/mL (ref 0.8–2.0)

## 2022-03-13 MED ORDER — PANTOPRAZOLE SODIUM 40 MG PO TBEC: 40.0000 mg | DELAYED_RELEASE_TABLET | Freq: Two times a day (BID) | ORAL | 0 refills | Status: DC

## 2022-03-13 NOTE — Progress Notes (Signed)
? ? ?HEART & VASCULAR TRANSITION OF CARE CONSULT NOTE  ? ? ? ?Referring Physician: Dr. Cathlean Sauer  ?Primary Care: [name], MD  ?Primary Cardiologist: [name], MD ? ?HPI: ?Referred to clinic by Dr. Cathlean Sauer for heart failure consultation.  ? ?88 year old male with chronic diastolic heart failure, chronic atrial fibrillation not on anticoagulation due to frequent falls, valvular heart disease with history of severe aortic stenosis status post TAVR in 2014 as well as both severe MR and TR, PAD and stage IIIb CKD. ? ?Recent admission to [facility] for acute on chronic diastolic heart failure and fluid overload.  Echocardiogram showed normal left ventricular EF, 55 to 60%, severe biatrial enlargement, severe MR and severe TR with severely elevated pulmonary artery systolic pressure and moderately reduced RV. ? ?He was seen by cardiology and diuresed with IV Lasix.  Dr. Burt Knack also evaluated the patient during this hospitalization and determined that he was not a candidate for percutaneous valvular interventions including MitraClip given advanced age and fragility.  Palliative care was consulted during hospitalization. ? ?After diuresis with IV Lasix was transitioned to p.o. Lasix along with [place].  Digoxin was also added. Continued on metoprolol. Discharged to SNF.  Discharge weight 142 lb. Refer to [facility] clinic.  ? ?Presents today for assessment . Here w/ daughter. Still at SNF getting rehab. In [place]. Feel that he is getting stronger w/ PT. Breathing much improved. Denies resting dyspnea. Mild exertional dyspnea w/ PT. No LEE. Wt up 3 lbs since d/c. Reports full med compliance. No orthostatic symptoms. Afib remains rate controlled in 70s. BP 585I systolic.  ? ?He notes some dysuria and and daughter notes intermittent confusion. He had a UTI ~1 month ago treated w/ Keflex. He denies fever and chills.  ? ?Cardiac Testing  ? ?Echo 03/02/22 ?Left ventricular ejection fraction, by estimation, is 55 to  60%. The left ventricle has ?normal function. The left ventricle has no regional wall motion abnormalities. Left ?ventricular diastolic parameters are indeterminate. ?1. ?Right ventricular systolic function is moderately reduced. The right ventricular size is ?mildly enlarged. There is severely elevated pulmonary artery systolic pressure. ?2. ?3. Left atrial size was severely dilated. ?4. Right atrial size was severely dilated. ?ERO > .4 similar to prior echo and PISA radius 1 MAC with leaflet thickening and ?restricted posterior leaflet . The mitral valve is normal in structure. Severe mitral valve ?regurgitation. No evidence of mitral stenosis. ?5. ?6. Tricuspid valve regurgitation is severe. ?Post TAVR with 26 mm Sapien 3 mild PVL seen best on SA views at 7:00 mean ?gradient 6 mmHg lower than TTE 11/21/21 . The aortic valve has been repaired/replaced. ?Aortic valve regurgitation is not visualized. No aortic stenosis is present. ?7. ?The inferior vena cava is dilated in size with >50% respiratory variability, suggesting ?right atrial pressure of 8 mmHg. ? ? ?Review of Systems: [y] = yes, _0  = no  ? ?General: Weight gain _1 ; Weight loss _2 ; Anorexia _3 ; Fatigue _4 ; Fever _5 ; Chills _6 ; Weakness _7   ?Cardiac: Chest pain/pressure _8 ; Resting SOB _9 ; Exertional SOB [ Y]; Orthopnea _10 ; Pedal Edema _11 ; Palpitations _12 ; Syncope _13 ; Presyncope _14 ; Paroxysmal nocturnal dyspnea_15   ?Pulmonary: Cough _16 ; Wheezing_17 ; Hemoptysis_18 ; Sputum _19 ; Snoring _20   ?GI: Vomiting_21 ; Dysphagia_22 ; Melena_23 ; Hematochezia _24 ; Heartburn_25 ; Abdominal pain _26 ; Constipation _27 ; Diarrhea _28 ; BRBPR _29   ?  GU: Hematuria_0 ; Dysuria [ Y]; Nocturia_1   ?Vascular: Pain in legs with walking _2 ; Pain in feet with lying flat _3 ; Non-healing sores _4 ; Stroke _5 ; TIA _6 ; Slurred speech _7 ;  ?Neuro: Headaches_8 ; Vertigo_9 ; Seizures_10 ; Paresthesias_11 ;Blurred vision _12 ; Diplopia _13 ; Vision changes _14   ?Ortho/Skin:  Arthritis _15 ; Joint pain _16 ; Muscle pain _17 ; Joint swelling _18 ; Back Pain _19 ; Rash _20   ?Psych: Depression_21 ; Anxiety_22   ?Heme: Bleeding problems _23 ; Clotting disorders _24 ; Anemia _25   ?Endocrine: Diabetes _26 ; Thyroid dysfunction_27  ? ? ?Past Medical History:  ?Diagnosis Date  ? Anemia   ? LOW PLATELETS OTHER DAY  PER PT  ? Anticoagulated on Coumadin 12/21/2012  ? Aortic stenosis   ? Arthritis   ? "left wrist; back sometimes" (01/21/2013)  ? Atrial fibrillation ([place])   ? [name], DR Burt Knack  ? Bradycardia   ? Exertional shortness of breath   ? Heart murmur   ? "I've had it for years; runs in the family on daddy's side" (01/21/2013)  ? HTN (hypertension)   ? Hyperlipemia 12/21/2012  ? Prostate cancer ([place])   ? "had 40 tx of radiation in 2009" (01/21/2013)  ? S/P aortic valve replacement with bioprosthetic valve 01/19/2013  ? Transcatheter Aortic Valve Replacement using 42m Sapien bioprosthetic tissue valve via transapical approach  ? Type II diabetes mellitus ([place]   ? ? ?Current Outpatient Medications  ?Medication Sig Dispense Refill  ? acetaminophen (TYLENOL) 650 MG CR tablet Take 650 mg by mouth daily as needed for pain.    ? aspirin EC 81 MG tablet Take 1 tablet (81 mg total) by mouth daily. Swallow whole. 90 tablet 0  ? bicalutamide (CASODEX) 50 MG tablet Take 50 mg by mouth See admin instructions. Take 50 mg by mouth in the afternoon    ? cholecalciferol (VITAMIN D3) 25 MCG (1000 UNIT) tablet Take 1,000 Units by mouth 2 (two) times daily with a meal.    ? dapagliflozin propanediol (FARXIGA) 10 MG TABS tablet Take 10 mg by mouth daily.    ? digoxin (LANOXIN) 0.125 MG tablet Take 0.5 tablets (0.0625 mg total) by mouth daily. 15 tablet 0  ? feeding supplement, GLUCERNA SHAKE, (GLUCERNA SHAKE) LIQD Take 237 mLs by mouth 3 (three) times daily between meals. 21330 mL 0  ? ferrous sulfate 325 (65 FE) MG tablet Take 1 tablet (325 mg total) by mouth 2 (two) times daily with a meal. 60 tablet 0  ? fluticasone  (FLONASE) 50 MCG/ACT nasal spray Place 1 spray into both nostrils daily. 11.1 mL 0  ? furosemide (LASIX) 40 MG tablet Take 1 tablet (40 mg total) by mouth 2 (two) times daily. 30 tablet 0  ? gabapentin (NEURONTIN) 100 MG capsule Take 1 capsule (100 mg total) by mouth at bedtime. 30 capsule 0  ? loratadine (CLARITIN) 10 MG tablet Take 10 mg by mouth daily.    ? metFORMIN (GLUCOPHAGE) 1000 MG tablet Take 1 tablet (1,000 mg total) by mouth 2 (two) times daily with breakfast and lunch. 60 tablet 0  ? metoprolol succinate (TOPROL-XL) 25 MG 24 hr tablet Take 0.5 tablets (12.5 mg total) by mouth daily. 15 tablet 0  ? Multiple Vitamin (MULTIVITAMIN WITH MINERALS) TABS tablet Take 1 tablet by mouth daily. 30 tablet 0  ? pantoprazole (PROTONIX) 40 MG tablet Take 1 tablet (40 mg total) by mouth daily.  Take  1 tab of 40 mg tab  by mouth twice a day till 02/16/22 then 1 tab of 40 mg by mouth daily starting 02/17/22 30 tablet 0  ? rosuvastatin (CRESTOR) 5 MG tablet TAKE 1 TABLET BY MOUTH ONCE DAILY AT  6PM 30 tablet 0  ? vitamin B-12 (CYANOCOBALAMIN) 1000 MCG tablet Take 1 tablet (1,000 mcg total) by mouth 2 (two) times daily with a meal. 60 tablet 0  ? ?No current facility-administered medications for this encounter.  ? ? ?Allergies  ?Allergen Reactions  ? Amoxicillin Other (See Comments)  ?  Burning of mouth, tongue and lips. ? itchy, burning throat  ? ? ?  ?Social History  ? ?Socioeconomic History  ? Marital status: Married  ?  Spouse name: [name]  ? Number of children: 2  ? Years of education: Not on file  ? Highest education level: High school graduate  ?Occupational History  ? Occupation: Retired  ?Tobacco Use  ? Smoking status: Former  ?  Packs/day: 2.00  ?  Years: 22.00  ?  Pack years: 44.00  ?  Types: Cigarettes  ?  Quit date: 09/11/1976  ?  Years since quitting: 45.5  ? Smokeless tobacco: Never  ?Vaping Use  ? Vaping Use: Never used  ?Substance and Sexual Activity  ? Alcohol use: Not on file  ? Drug use: No  ? Sexual  activity: Never  ?Other Topics Concern  ? Not on file  ?Social History Narrative  ? Not on file  ? ?Social Determinants of Health  ? ?Financial Resource Strain: Not on file  ?Food Insecurity: No Food Insecurity  ? Wor

## 2022-03-13 NOTE — Telephone Encounter (Signed)
Heart Failure Nurse Navigator Progress Note  ? ?Called to leave patient a message, unable to do so, to confirm appointment 03/13/22 @ 11 am.  ? ?Earnestine Leys, BSN, RN ?Heart Failure Nurse Navigator ?Secure Chat Only  ?

## 2022-03-13 NOTE — Patient Instructions (Signed)
Following orders sent back to [place]: ? ?Increase Protonix to 40 mg Twice daily  ?Labs done today, we will call for abnormal results ?Helene Kelp will collect a urine sample for testing and treat if positive for UTI ?Follow up with [place] in 4 weeks ?

## 2022-03-15 ENCOUNTER — Telehealth (HOSPITAL_COMMUNITY): Payer: Self-pay

## 2022-03-15 NOTE — Telephone Encounter (Signed)
-----   Message from Consuelo Pandy, [place] sent at 03/13/2022  2:10 PM EDT ----- ?SCr/K stable.  ? ?Digoxin level is elevated despite being on lowest dose. STOP Digoxin. ?

## 2022-03-15 NOTE — Telephone Encounter (Signed)
Virgilio Belling, RN from [facility] advised and verbalized standing. Med list updated to reflect changes.  ?

## 2022-03-21 DIAGNOSIS — Z741 Need for assistance with personal care: Secondary | ICD-10-CM | POA: Diagnosis not present

## 2022-03-21 DIAGNOSIS — R2681 Unsteadiness on feet: Secondary | ICD-10-CM | POA: Diagnosis not present

## 2022-03-21 DIAGNOSIS — M6281 Muscle weakness (generalized): Secondary | ICD-10-CM | POA: Diagnosis not present

## 2022-03-21 DIAGNOSIS — M6259 Muscle wasting and atrophy, not elsewhere classified, multiple sites: Secondary | ICD-10-CM | POA: Diagnosis not present

## 2022-03-21 DIAGNOSIS — I5032 Chronic diastolic (congestive) heart failure: Secondary | ICD-10-CM | POA: Diagnosis not present

## 2022-03-22 ENCOUNTER — Encounter: Payer: Self-pay | Admitting: Adult Health

## 2022-03-22 ENCOUNTER — Non-Acute Institutional Stay (SKILLED_NURSING_FACILITY): Payer: Medicare Other | Admitting: Adult Health

## 2022-03-22 DIAGNOSIS — I5032 Chronic diastolic (congestive) heart failure: Secondary | ICD-10-CM | POA: Diagnosis not present

## 2022-03-22 DIAGNOSIS — I10 Essential (primary) hypertension: Secondary | ICD-10-CM

## 2022-03-22 DIAGNOSIS — E785 Hyperlipidemia, unspecified: Secondary | ICD-10-CM | POA: Diagnosis not present

## 2022-03-22 DIAGNOSIS — R531 Weakness: Secondary | ICD-10-CM | POA: Diagnosis not present

## 2022-03-22 DIAGNOSIS — M6281 Muscle weakness (generalized): Secondary | ICD-10-CM | POA: Diagnosis not present

## 2022-03-22 DIAGNOSIS — M6259 Muscle wasting and atrophy, not elsewhere classified, multiple sites: Secondary | ICD-10-CM | POA: Diagnosis not present

## 2022-03-22 DIAGNOSIS — Z741 Need for assistance with personal care: Secondary | ICD-10-CM | POA: Diagnosis not present

## 2022-03-22 DIAGNOSIS — I482 Chronic atrial fibrillation, unspecified: Secondary | ICD-10-CM

## 2022-03-22 DIAGNOSIS — E1169 Type 2 diabetes mellitus with other specified complication: Secondary | ICD-10-CM | POA: Diagnosis not present

## 2022-03-22 DIAGNOSIS — N390 Urinary tract infection, site not specified: Secondary | ICD-10-CM | POA: Diagnosis not present

## 2022-03-22 DIAGNOSIS — R2681 Unsteadiness on feet: Secondary | ICD-10-CM | POA: Diagnosis not present

## 2022-03-22 LAB — CBC: RBC: 3.36 — AB (ref 3.87–5.11)

## 2022-03-22 LAB — CBC AND DIFFERENTIAL
HCT: 34 — AB (ref 41–53)
Hemoglobin: 11.2 — AB (ref 13.5–17.5)
Neutrophils Absolute: 5.4
Platelets: 285 10*3/uL (ref 150–400)
WBC: 7

## 2022-03-22 LAB — BASIC METABOLIC PANEL
BUN: 46 — AB (ref 4–21)
CO2: 31 — AB (ref 13–22)
Chloride: 89 — AB (ref 99–108)
Creatinine: 1.1 (ref 0.6–1.3)
Glucose: 396
Potassium: 4.7 mEq/L (ref 3.5–5.1)
Sodium: 134 — AB (ref 137–147)

## 2022-03-22 LAB — COMPREHENSIVE METABOLIC PANEL
Calcium: 9.6 (ref 8.7–10.7)
eGFR: 66

## 2022-03-22 NOTE — Progress Notes (Signed)
Location:  [facility] Room Number: 216-A Place of Service:  SNF (31) Provider:  Durenda Age, DNP, FNP-BC  Patient Care Team: [name], MD as PCP - General (Family Medicine) [name], MD as PCP - Cardiology (Cardiology) Sherren Mocha, MD as Attending Physician (Cardiology) [name], MD (Inactive) as Attending Physician (Cardiothoracic Surgery) [name], MD (Inactive) as Attending Physician (Cardiothoracic Surgery)  Extended Emergency Contact Information Primary Emergency Contact: [address]          [place], [place] [address] [name] of [place] Phone: [phone] Mobile Phone: [phone] Relation: Daughter Secondary Emergency Contact: Tennis Must Address: [address]          [place], [place] [address] [name] of [place] Phone: [phone] Mobile Phone: [phone] Relation: Spouse  Code Status:  DNR  Goals of care: Advanced Directive information    03/28/2022   12:23 PM  Advanced Directives  Does Patient Have a Medical Advance Directive? Yes  Type of Advance Directive Out of facility DNR (pink MOST or yellow form)  Pre-existing out of facility DNR order (yellow form or pink MOST form) Yellow form placed in chart (order not valid for inpatient use)     Chief Complaint  Patient presents with   Follow-up    Short-term rehab follow-up      HPI:  Pt is a 88 y.o. male seen today for short-term rehabilitation visit. He is currently having PT, OT and ST.  Type 2 diabetes mellitus with hyperlipidemia (HCC) -  CBGs ranging from 175 to 403, with outlier 459, takes Farxiga 10 mg 1 tab daily and metformin 1000 mg twice a day  Chronic diastolic heart failure (HCC)  -  no SOB, takes Furosemide 40 mg BID  Chronic atrial fibrillation with RVR ([place]) -   takes metoprolol succinate ER 12.5 mg daily  Essential hypertension  -  SBPs ranging from 102 to 137, with outlier 93,  takes metoprolol succinate ER 12.5 mg daily  Generalized weakness  -  currently having PT and OT   Past Medical History:  Diagnosis Date   Anemia    LOW PLATELETS OTHER DAY  PER PT   Anticoagulated on Coumadin 12/21/2012   Aortic stenosis    Arthritis    "left wrist; back sometimes" (01/21/2013)   Atrial fibrillation ([place])    [name], DR COOPER   Bradycardia    Exertional shortness of breath    Heart murmur    "I've had it for years; runs in the family on daddy's side" (01/21/2013)   HTN (hypertension)    Hyperlipemia 12/21/2012   Prostate cancer ([place])    "had 40 tx of radiation in 2009" (01/21/2013)   S/P aortic valve replacement with bioprosthetic valve 01/19/2013   Transcatheter Aortic Valve Replacement using 24m Sapien bioprosthetic tissue valve via transapical approach   Type II diabetes mellitus ([facility]    Past Surgical History:  Procedure Laterality Date   BALLOON DILATION N/A 08/02/2020   Procedure: BALLOON DILATION;  Surgeon: [name] MD;  Location: [facility];  Service: Gastroenterology;  Laterality: N/A;   BIOPSY  10/03/2021   Procedure: BIOPSY;  Surgeon: [name] MD;  Location: [facility];  Service: Endoscopy;;   BIOPSY  12/14/2021   Procedure: BIOPSY;  Surgeon: [name] MD;  Location: [facility]  Service: Endoscopy;;   CARDIAC CATHETERIZATION  12/16/2012   CARDIAC VALVE REPLACEMENT  01/19/2013   AVR   CATARACT EXTRACTION W/ INTRAOCULAR LENS  IMPLANT, BILATERAL  ESOPHAGOGASTRODUODENOSCOPY N/A 10/03/2021   Procedure: ESOPHAGOGASTRODUODENOSCOPY (EGD);  Surgeon: [name], MD;  Location: [facility];  Service: Endoscopy;  Laterality: N/A;   ESOPHAGOGASTRODUODENOSCOPY N/A 12/14/2021   Procedure: ESOPHAGOGASTRODUODENOSCOPY (EGD);  Surgeon: Wilford Corner, MD;  Location: [facility];  Service: Endoscopy;  Laterality: N/A;   ESOPHAGOGASTRODUODENOSCOPY (EGD) WITH PROPOFOL N/A 08/02/2020   Procedure: ESOPHAGOGASTRODUODENOSCOPY (EGD) WITH  PROPOFOL;  Surgeon: [name], MD;  Location: [facility];  Service: Gastroenterology;  Laterality: N/A;   EYE SURGERY     INTRAOPERATIVE TRANSESOPHAGEAL ECHOCARDIOGRAM N/A 01/19/2013   Procedure: INTRAOPERATIVE TRANSESOPHAGEAL ECHOCARDIOGRAM;  Surgeon: [name], MD;  Location: [facility];  Service: Open Heart Surgery;  Laterality: N/A;   ORIF FOREARM FRACTURE Left 1954   "compound fx" (01/21/2013)   TRANSTHORACIC ECHOCARDIOGRAM  09/04/10, 09/07/08    Allergies  Allergen Reactions   Amoxicillin Other (See Comments)    Burning of mouth, tongue and lips.  itchy, burning throat    Outpatient Encounter Medications as of 03/22/2022  Medication Sig   acetaminophen (TYLENOL) 650 MG CR tablet Take 650 mg by mouth daily as needed for pain.   aspirin EC 81 MG tablet Take 81 mg by mouth daily. Swallow whole.   bicalutamide (CASODEX) 50 MG tablet Take 50 mg by mouth daily.  in the afternoon   Chloroxylenol-Zinc Oxide (BAZA EX) Apply 1 application. topically 2 (two) times daily.  12 %, APPLY TOPICALLY TO SACRUM/BUTTOCKS   cholecalciferol (VITAMIN D3) 25 MCG (1000 UNIT) tablet Take 1,000 Units by mouth 2 (two) times daily with a meal.   dapagliflozin propanediol (FARXIGA) 10 MG TABS tablet Take 10 mg by mouth daily.   feeding supplement, GLUCERNA SHAKE, (GLUCERNA SHAKE) LIQD Take 237 mLs by mouth 3 (three) times daily between meals.   ferrous sulfate 325 (65 FE) MG tablet Take 1 tablet (325 mg total) by mouth 2 (two) times daily with a meal.   fluticasone (FLONASE) 50 MCG/ACT nasal spray Place 1 spray into both nostrils daily.   furosemide (LASIX) 40 MG tablet Take 1 tablet (40 mg total) by mouth 2 (two) times daily.   gabapentin (NEURONTIN) 100 MG capsule Take 1 capsule (100 mg total) by mouth at bedtime.   loratadine (CLARITIN) 10 MG tablet Take 10 mg by mouth daily.   metFORMIN (GLUCOPHAGE) 1000 MG tablet Take 1 tablet (1,000 mg total) by mouth 2 (two) times daily with breakfast and lunch.    metoprolol succinate (TOPROL-XL) 25 MG 24 hr tablet Take 0.5 tablets (12.5 mg total) by mouth daily.   Multiple Vitamin (MULTIVITAMIN WITH MINERALS) TABS tablet Take 1 tablet by mouth daily.   OXYGEN Inhale 2 L into the lungs continuous.   pantoprazole (PROTONIX) 40 MG tablet Take 1 tablet (40 mg total) by mouth 2 (two) times daily. Take  1 tab of 40 mg tab  by mouth twice a day till 02/16/22 then 1 tab of 40 mg by mouth daily starting 02/17/22   rosuvastatin (CRESTOR) 5 MG tablet TAKE 1 TABLET BY MOUTH ONCE DAILY AT  6PM   UNABLE TO FIND Take 1 Canister by mouth 2 (two) times daily. Med Name: Magic Cup   vitamin B-12 (CYANOCOBALAMIN) 1000 MCG tablet Take 1 tablet (1,000 mcg total) by mouth 2 (two) times daily with a meal.   No facility-administered encounter medications on file as of 03/22/2022.    Review of Systems  Constitutional:  Negative for activity change, appetite change and fever.  HENT:  Negative for sore throat.   Eyes: Negative.  Cardiovascular:  Negative for chest pain and leg swelling.  Gastrointestinal:  Negative for abdominal distention, diarrhea and vomiting.  Genitourinary:  Negative for dysuria, frequency and urgency.  Skin:  Negative for color change.  Neurological:  Negative for dizziness and headaches.  Psychiatric/Behavioral:  Negative for behavioral problems and sleep disturbance. The patient is not nervous/anxious.       Immunization History  Administered Date(s) Administered   Influenza,inj,Quad PF,6+ Mos 07/15/2016, 08/01/2017, 07/24/2018, 08/27/2019, 10/05/2020, 07/12/2021   Influenza,inj,quad, With Preservative 07/21/2014, 08/14/2015   Influenza-Unspecified 07/05/2012   Janssen (J&J) SARS-COV-2 Vaccination 02/13/2020, 09/19/2020   Pneumococcal Conjugate PCV 7 08/04/2017   Pneumococcal Conjugate-13 04/28/2014   Pneumococcal Polysaccharide-23 09/03/2010   Tdap 02/05/2016   Pertinent  Health Maintenance Due  Topic Date Due   OPHTHALMOLOGY EXAM  Never done    URINE MICROALBUMIN  Never done   FOOT EXAM  03/09/2022   INFLUENZA VACCINE  06/04/2022   HEMOGLOBIN A1C  06/14/2022      03/05/2022    8:50 AM 03/05/2022    8:00 PM 03/06/2022   11:45 AM 03/06/2022    8:00 PM 03/07/2022    8:20 AM  Fall Risk  Patient Fall Risk Level Moderate fall risk Moderate fall risk Moderate fall risk Moderate fall risk High fall risk     Vitals:   03/22/22 1148  BP: 110/60  Pulse: 78  Resp: 18  Temp: (!) 97.5 F (36.4 C)  SpO2: 96%  Weight: 135 lb 12.8 oz (61.6 kg)  Height: '5\' 8"'$  (1.727 m)   Body mass index is 20.65 kg/m.  Physical Exam Constitutional:      Appearance: Normal appearance.  HENT:     Head: Normocephalic and atraumatic.     Mouth/Throat:     Mouth: Mucous membranes are moist.  Eyes:     Conjunctiva/sclera: Conjunctivae normal.  Cardiovascular:     Rate and Rhythm: Normal rate and regular rhythm.     Pulses: Normal pulses.     Heart sounds: Normal heart sounds.  Pulmonary:     Effort: Pulmonary effort is normal.     Breath sounds: Normal breath sounds.  Abdominal:     General: Bowel sounds are normal.     Palpations: Abdomen is soft.  Musculoskeletal:        General: No swelling. Normal range of motion.     Cervical back: Normal range of motion.  Skin:    General: Skin is warm and dry.  Neurological:     Mental Status: He is alert. Mental status is at baseline.     Comments: Alert to self and place, disoriented to time.  Psychiatric:        Mood and Affect: Mood normal.        Behavior: Behavior normal.        Thought Content: Thought content normal.        Judgment: Judgment normal.       Labs reviewed: Recent Labs    04/04/21 0918 10/01/21 2019 10/06/21 1041 10/19/21 0000 12/14/21 0545 12/15/21 0548 03/06/22 0333 03/07/22 0254 03/13/22 1213 03/22/22 0000  NA 143   < > 132*   < > 141   < > 137 137 136 134*  K 5.2   < > 3.4*   < > 4.2   < > 4.2 4.6 4.5 4.7  CL 101   < > 106   < > 106   < > 99 100 94* 89*   CO2 25   < >  19*   < > 25   < > '28 26 31 '$ 31*  GLUCOSE 195*   < > 324*   < > 117*   < > 164* 96 250*  --   BUN 46*   < > 38*   < > 65*   < > 51* 54* 45* 46*  CREATININE 1.38*   < > 1.07   < > 1.56*   < > 1.23 1.41* 1.21 1.1  CALCIUM 9.7   < > 8.0*   < > 8.8*   < > 8.7* 8.8* 9.2 9.6  MG 2.3  --  2.2  --  2.1  --   --   --   --   --   PHOS  --   --   --   --  5.0*  --   --   --   --   --    < > = values in this interval not displayed.   Recent Labs    12/14/21 0545 12/15/21 0548 03/01/22 0847  AST '29 29 26  '$ ALT '17 20 17  '$ ALKPHOS 58 67 76  BILITOT 1.4* 1.7* 1.4*  PROT 5.3* 5.5* 6.0*  ALBUMIN 2.8* 2.8* 3.1*   Recent Labs    12/28/21 0000 01/23/22 1442 03/01/22 0847 03/02/22 0336 03/03/22 0245 03/22/22 0000  WBC 5.6   < > 8.7 6.6 7.0 7.0  NEUTROABS 4.00  --  7.2  --   --  5.40  HGB 10.9*   < > 10.6* 10.8* 10.3* 11.2*  HCT 33*   < > 33.5* 34.5* 31.2* 34*  MCV  --    < > 105.3* 105.5* 103.7*  --   PLT 224   < > 186 199 196 285   < > = values in this interval not displayed.   Lab Results  Component Value Date   TSH 1.200 03/01/2022   Lab Results  Component Value Date   HGBA1C 5.4 12/15/2021   Lab Results  Component Value Date   CHOL 81 (L) 08/17/2018   HDL 28 (L) 08/17/2018   LDLCALC 37 08/17/2018   TRIG 82 08/17/2018   CHOLHDL 2.9 08/17/2018    Significant Diagnostic Results in last 30 days:  No results found.  Assessment/Plan  1. Type 2 diabetes mellitus with hyperlipidemia [facility]) Lab Results  Component Value Date   HGBA1C 5.4 12/15/2021   -  CBGs stable, continue [place] and metformin  2. Chronic diastolic heart failure (HCC) -  no SOB, continue furosemide  3. Chronic atrial fibrillation with RVR (HCC) -Rate controlled, continue metoprolol succinate  4. Essential hypertension -Blood pressure well controlled Continue current medication  5. Generalized weakness -    Continue PT and OT, for therapeutic strengthening exercises    Family/ staff  Communication: Discussed plan of care with resident and charge nurse.  Labs/tests ordered:   None    Durenda Age, DNP, MSN, FNP-BC [facility] [phone] (Monday-Friday 8:00 a.m. - 5:00 p.m.) [phone] (after hours)

## 2022-03-25 ENCOUNTER — Encounter (INDEPENDENT_AMBULATORY_CARE_PROVIDER_SITE_OTHER): Payer: Medicare Other | Admitting: Ophthalmology

## 2022-03-25 DIAGNOSIS — H33302 Unspecified retinal break, left eye: Secondary | ICD-10-CM

## 2022-03-25 DIAGNOSIS — H35371 Puckering of macula, right eye: Secondary | ICD-10-CM

## 2022-03-25 DIAGNOSIS — H353122 Nonexudative age-related macular degeneration, left eye, intermediate dry stage: Secondary | ICD-10-CM

## 2022-03-25 DIAGNOSIS — H43813 Vitreous degeneration, bilateral: Secondary | ICD-10-CM | POA: Diagnosis not present

## 2022-03-25 DIAGNOSIS — Z741 Need for assistance with personal care: Secondary | ICD-10-CM | POA: Diagnosis not present

## 2022-03-25 DIAGNOSIS — I5032 Chronic diastolic (congestive) heart failure: Secondary | ICD-10-CM | POA: Diagnosis not present

## 2022-03-25 DIAGNOSIS — I10 Essential (primary) hypertension: Secondary | ICD-10-CM

## 2022-03-25 DIAGNOSIS — R2681 Unsteadiness on feet: Secondary | ICD-10-CM | POA: Diagnosis not present

## 2022-03-25 DIAGNOSIS — H35033 Hypertensive retinopathy, bilateral: Secondary | ICD-10-CM

## 2022-03-25 DIAGNOSIS — M6281 Muscle weakness (generalized): Secondary | ICD-10-CM | POA: Diagnosis not present

## 2022-03-25 DIAGNOSIS — M6259 Muscle wasting and atrophy, not elsewhere classified, multiple sites: Secondary | ICD-10-CM | POA: Diagnosis not present

## 2022-03-26 DIAGNOSIS — R2681 Unsteadiness on feet: Secondary | ICD-10-CM | POA: Diagnosis not present

## 2022-03-26 DIAGNOSIS — M6281 Muscle weakness (generalized): Secondary | ICD-10-CM | POA: Diagnosis not present

## 2022-03-26 DIAGNOSIS — I5032 Chronic diastolic (congestive) heart failure: Secondary | ICD-10-CM | POA: Diagnosis not present

## 2022-03-26 DIAGNOSIS — Z741 Need for assistance with personal care: Secondary | ICD-10-CM | POA: Diagnosis not present

## 2022-03-26 DIAGNOSIS — M6259 Muscle wasting and atrophy, not elsewhere classified, multiple sites: Secondary | ICD-10-CM | POA: Diagnosis not present

## 2022-03-27 ENCOUNTER — Encounter: Payer: Self-pay | Admitting: Internal Medicine

## 2022-03-27 DIAGNOSIS — R2681 Unsteadiness on feet: Secondary | ICD-10-CM | POA: Diagnosis not present

## 2022-03-27 DIAGNOSIS — H3530 Unspecified macular degeneration: Secondary | ICD-10-CM | POA: Insufficient documentation

## 2022-03-27 DIAGNOSIS — M6259 Muscle wasting and atrophy, not elsewhere classified, multiple sites: Secondary | ICD-10-CM | POA: Diagnosis not present

## 2022-03-27 DIAGNOSIS — Z741 Need for assistance with personal care: Secondary | ICD-10-CM | POA: Diagnosis not present

## 2022-03-27 DIAGNOSIS — I5032 Chronic diastolic (congestive) heart failure: Secondary | ICD-10-CM | POA: Diagnosis not present

## 2022-03-27 DIAGNOSIS — M6281 Muscle weakness (generalized): Secondary | ICD-10-CM | POA: Diagnosis not present

## 2022-03-28 ENCOUNTER — Non-Acute Institutional Stay (SKILLED_NURSING_FACILITY): Payer: Medicare Other | Admitting: Adult Health

## 2022-03-28 ENCOUNTER — Encounter: Payer: Self-pay | Admitting: Adult Health

## 2022-03-28 DIAGNOSIS — R531 Weakness: Secondary | ICD-10-CM

## 2022-03-28 DIAGNOSIS — E782 Mixed hyperlipidemia: Secondary | ICD-10-CM

## 2022-03-28 DIAGNOSIS — E1149 Type 2 diabetes mellitus with other diabetic neurological complication: Secondary | ICD-10-CM

## 2022-03-28 DIAGNOSIS — Z741 Need for assistance with personal care: Secondary | ICD-10-CM | POA: Diagnosis not present

## 2022-03-28 DIAGNOSIS — M6259 Muscle wasting and atrophy, not elsewhere classified, multiple sites: Secondary | ICD-10-CM | POA: Diagnosis not present

## 2022-03-28 DIAGNOSIS — R2681 Unsteadiness on feet: Secondary | ICD-10-CM | POA: Diagnosis not present

## 2022-03-28 DIAGNOSIS — I5031 Acute diastolic (congestive) heart failure: Secondary | ICD-10-CM | POA: Diagnosis not present

## 2022-03-28 DIAGNOSIS — M6281 Muscle weakness (generalized): Secondary | ICD-10-CM | POA: Diagnosis not present

## 2022-03-28 DIAGNOSIS — I482 Chronic atrial fibrillation, unspecified: Secondary | ICD-10-CM

## 2022-03-28 DIAGNOSIS — K254 Chronic or unspecified gastric ulcer with hemorrhage: Secondary | ICD-10-CM

## 2022-03-28 DIAGNOSIS — C61 Malignant neoplasm of prostate: Secondary | ICD-10-CM

## 2022-03-28 DIAGNOSIS — I5032 Chronic diastolic (congestive) heart failure: Secondary | ICD-10-CM | POA: Diagnosis not present

## 2022-03-28 DIAGNOSIS — D62 Acute posthemorrhagic anemia: Secondary | ICD-10-CM

## 2022-03-28 DIAGNOSIS — K219 Gastro-esophageal reflux disease without esophagitis: Secondary | ICD-10-CM

## 2022-03-28 DIAGNOSIS — G629 Polyneuropathy, unspecified: Secondary | ICD-10-CM

## 2022-03-28 NOTE — Progress Notes (Signed)
Location:  [place] Room Number: [place] of Service:  SNF (31) Provider:  Durenda Age, DNP, FNP-BC  Patient Care Team: [name], MD as PCP - General (Family Medicine) [name], MD as PCP - Cardiology (Cardiology) Sherren Mocha, MD as Attending Physician (Cardiology) [name], MD (Inactive) as Attending Physician (Cardiothoracic Surgery) [name], MD (Inactive) as Attending Physician (Cardiothoracic Surgery)  Extended Emergency Contact Information Primary Emergency Contact: [place]          [place], [place] [address] [name] of [place] Phone: [phone] Mobile Phone: [phone] Relation: Daughter Secondary Emergency Contact: Tennis Must Address: [address]          [place], [place] [address] [name] of [place] Phone: [phone] Mobile Phone: [phone] Relation: Spouse  Code Status:  DNR  Goals of care: Advanced Directive information    03/28/2022   12:23 PM  Advanced Directives  Does Patient Have a Medical Advance Directive? Yes  Type of Advance Directive Out of facility DNR (pink MOST or yellow form)  Pre-existing out of facility DNR order (yellow form or pink MOST form) Yellow form placed in chart (order not valid for inpatient use)     Chief Complaint  Patient presents with   Discharge Note    Discharge     HPI:  Pt is a 88 y.o. male who is for discharge home on 03/29/2022 with home health PT, OT and Nursing.  He was admitted to [facility] on 03/07/22 post hospital admission 03/01/2022 to 03/07/22.  He has a PMH of CAD, severe AS s/p TAVR, type 2 diabetes mellitus, hypertension, chronic kidney disease and atrial fibrillation.  He was brought to the hospital by family due to altered mentation.  His torsemide dosage was recently increased due to signs of hypervolemia.  He was placed on furosemide for diuresis and  improved.  Patient was admitted to this facility for short-term rehabilitation after the patient's recent hospitalization.  Patient has completed SNF rehabilitation and therapy has cleared the patient for discharge.   Past Medical History:  Diagnosis Date   Anemia    LOW PLATELETS OTHER DAY  PER PT   Anticoagulated on Coumadin 12/21/2012   Aortic stenosis    Arthritis    "left wrist; back sometimes" (01/21/2013)   Atrial fibrillation ([place])    [name], DR COOPER   Bradycardia    Exertional shortness of breath    Heart murmur    "I've had it for years; runs in the family on daddy's side" (01/21/2013)   HTN (hypertension)    Hyperlipemia 12/21/2012   Prostate cancer ([place])    "had 40 tx of radiation in 2009" (01/21/2013)   S/P aortic valve replacement with bioprosthetic valve 01/19/2013   Transcatheter Aortic Valve Replacement using 22m Sapien bioprosthetic tissue valve via transapical approach   Type II diabetes mellitus ([place]    Past Surgical History:  Procedure Laterality Date   BALLOON DILATION N/A 08/02/2020   Procedure: BALLOON DILATION;  Surgeon: [name] MD;  Location: [facility];  Service: Gastroenterology;  Laterality: N/A;   BIOPSY  10/03/2021   Procedure: BIOPSY;  Surgeon: [name] MD;  Location: [facility];  Service: Endoscopy;;   BIOPSY  12/14/2021   Procedure: BIOPSY;  Surgeon: [name] MD;  Location: [facility]  Service: Endoscopy;;   CARDIAC CATHETERIZATION  12/16/2012   CARDIAC VALVE REPLACEMENT  01/19/2013   AVR   CATARACT EXTRACTION W/ INTRAOCULAR LENS  IMPLANT, BILATERAL     ESOPHAGOGASTRODUODENOSCOPY N/A 10/03/2021   Procedure: ESOPHAGOGASTRODUODENOSCOPY (EGD);  Surgeon: [name], MD;  Location: [facility];  Service: Endoscopy;  Laterality: N/A;   ESOPHAGOGASTRODUODENOSCOPY N/A 12/14/2021   Procedure: ESOPHAGOGASTRODUODENOSCOPY (EGD);  Surgeon: Wilford Corner, MD;  Location: [facility];  Service: Endoscopy;  Laterality: N/A;    ESOPHAGOGASTRODUODENOSCOPY (EGD) WITH PROPOFOL N/A 08/02/2020   Procedure: ESOPHAGOGASTRODUODENOSCOPY (EGD) WITH PROPOFOL;  Surgeon: [name], MD;  Location: [facility];  Service: Gastroenterology;  Laterality: N/A;   EYE SURGERY     INTRAOPERATIVE TRANSESOPHAGEAL ECHOCARDIOGRAM N/A 01/19/2013   Procedure: INTRAOPERATIVE TRANSESOPHAGEAL ECHOCARDIOGRAM;  Surgeon: [name], MD;  Location: [facility];  Service: Open Heart Surgery;  Laterality: N/A;   ORIF FOREARM FRACTURE Left 1954   "compound fx" (01/21/2013)   TRANSTHORACIC ECHOCARDIOGRAM  09/04/10, 09/07/08    Allergies  Allergen Reactions   Amoxicillin Other (See Comments)    Burning of mouth, tongue and lips.  itchy, burning throat    Outpatient Encounter Medications as of 03/28/2022  Medication Sig   acetaminophen (TYLENOL) 650 MG CR tablet Take 650 mg by mouth daily as needed for pain.   Chloroxylenol-Zinc Oxide (BAZA EX) Apply 1 application. topically 2 (two) times daily.  12 %, APPLY TOPICALLY TO SACRUM/BUTTOCKS   cholecalciferol (VITAMIN D3) 25 MCG (1000 UNIT) tablet Take 1,000 Units by mouth 2 (two) times daily with a meal.   feeding supplement, GLUCERNA SHAKE, (GLUCERNA SHAKE) LIQD Take 237 mLs by mouth 3 (three) times daily between meals.   fluticasone (FLONASE) 50 MCG/ACT nasal spray Place 1 spray into both nostrils daily.   loratadine (CLARITIN) 10 MG tablet Take 10 mg by mouth daily.   Multiple Vitamin (MULTIVITAMIN WITH MINERALS) TABS tablet Take 1 tablet by mouth daily.   OXYGEN Inhale 2 L into the lungs continuous.   UNABLE TO FIND Take 1 Canister by mouth 2 (two) times daily. Med Name: Magic Cup   vitamin B-12 (CYANOCOBALAMIN) 1000 MCG tablet Take 1 tablet (1,000 mcg total) by mouth 2 (two) times daily with a meal.   aspirin EC 81 MG tablet Take 1 tablet (81 mg total) by mouth daily. Swallow whole.   bicalutamide (CASODEX) 50 MG tablet Take 1 tablet (50 mg total) by mouth daily.  in the afternoon   dapagliflozin  propanediol (FARXIGA) 10 MG TABS tablet Take 1 tablet (10 mg total) by mouth daily.   ferrous sulfate 325 (65 FE) MG tablet Take 1 tablet (325 mg total) by mouth 2 (two) times daily with a meal.   furosemide (LASIX) 40 MG tablet Take 1 tablet (40 mg total) by mouth 2 (two) times daily.   gabapentin (NEURONTIN) 100 MG capsule Take 1 capsule (100 mg total) by mouth at bedtime.   metFORMIN (GLUCOPHAGE) 1000 MG tablet Take 1 tablet (1,000 mg total) by mouth 2 (two) times daily with breakfast and lunch.   metoprolol succinate (TOPROL-XL) 25 MG 24 hr tablet Take 0.5 tablets (12.5 mg total) by mouth daily.   pantoprazole (PROTONIX) 40 MG tablet Take 1 tablet (40 mg total) by mouth 2 (two) times daily. Take  1 tab of 40 mg tab  by mouth twice a day till 02/16/22 then 1 tab of 40 mg by mouth daily starting 02/17/22   rosuvastatin (CRESTOR) 5 MG tablet TAKE 1 TABLET BY MOUTH ONCE DAILY AT  6PM   [DISCONTINUED] aspirin EC 81 MG tablet Take 81 mg by mouth daily. Swallow whole.   [DISCONTINUED] bicalutamide (CASODEX) 50 MG tablet  Take 50 mg by mouth daily.  in the afternoon   [DISCONTINUED] dapagliflozin propanediol (FARXIGA) 10 MG TABS tablet Take 10 mg by mouth daily.   [DISCONTINUED] ferrous sulfate 325 (65 FE) MG tablet Take 1 tablet (325 mg total) by mouth 2 (two) times daily with a meal.   [DISCONTINUED] furosemide (LASIX) 40 MG tablet Take 1 tablet (40 mg total) by mouth 2 (two) times daily.   [DISCONTINUED] gabapentin (NEURONTIN) 100 MG capsule Take 1 capsule (100 mg total) by mouth at bedtime.   [DISCONTINUED] metFORMIN (GLUCOPHAGE) 1000 MG tablet Take 1 tablet (1,000 mg total) by mouth 2 (two) times daily with breakfast and lunch.   [DISCONTINUED] metoprolol succinate (TOPROL-XL) 25 MG 24 hr tablet Take 0.5 tablets (12.5 mg total) by mouth daily.   [DISCONTINUED] pantoprazole (PROTONIX) 40 MG tablet Take 1 tablet (40 mg total) by mouth 2 (two) times daily. Take  1 tab of 40 mg tab  by mouth twice a day  till 02/16/22 then 1 tab of 40 mg by mouth daily starting 02/17/22   [DISCONTINUED] rosuvastatin (CRESTOR) 5 MG tablet TAKE 1 TABLET BY MOUTH ONCE DAILY AT  6PM   No facility-administered encounter medications on file as of 03/28/2022.    Review of Systems  Constitutional:  Negative for activity change, appetite change and fever.  HENT:  Negative for sore throat.   Eyes: Negative.   Cardiovascular:  Negative for chest pain and leg swelling.  Gastrointestinal:  Negative for abdominal distention, diarrhea and vomiting.  Genitourinary:  Negative for dysuria, frequency and urgency.  Skin:  Negative for color change.  Neurological:  Negative for dizziness and headaches.  Psychiatric/Behavioral:  Negative for behavioral problems and sleep disturbance. The patient is not nervous/anxious.       Immunization History  Administered Date(s) Administered   Influenza,inj,Quad PF,6+ Mos 07/15/2016, 08/01/2017, 07/24/2018, 08/27/2019, 10/05/2020, 07/12/2021   Influenza,inj,quad, With Preservative 07/21/2014, 08/14/2015   Influenza-Unspecified 07/05/2012   Janssen (J&J) SARS-COV-2 Vaccination 02/13/2020, 09/19/2020   Pneumococcal Conjugate PCV 7 08/04/2017   Pneumococcal Conjugate-13 04/28/2014   Pneumococcal Polysaccharide-23 09/03/2010   Tdap 02/05/2016   Pertinent  Health Maintenance Due  Topic Date Due   OPHTHALMOLOGY EXAM  Never done   URINE MICROALBUMIN  Never done   FOOT EXAM  03/09/2022   INFLUENZA VACCINE  06/04/2022   HEMOGLOBIN A1C  06/14/2022      03/05/2022    8:50 AM 03/05/2022    8:00 PM 03/06/2022   11:45 AM 03/06/2022    8:00 PM 03/07/2022    8:20 AM  Fall Risk  Patient Fall Risk Level Moderate fall risk Moderate fall risk Moderate fall risk Moderate fall risk High fall risk     Vitals:   03/28/22 1239  BP: (!) 117/34  Pulse: 87  Resp: 18  Temp: (!) 97.5 F (36.4 C)  SpO2: 95%  Weight: 134 lb 12.8 oz (61.1 kg)  Height: '5\' 8"'$  (1.727 m)   Body mass index is 20.5  kg/m.  Physical Exam Constitutional:      Appearance: Normal appearance.  HENT:     Head: Normocephalic and atraumatic.     Mouth/Throat:     Mouth: Mucous membranes are moist.  Eyes:     Conjunctiva/sclera: Conjunctivae normal.  Cardiovascular:     Rate and Rhythm: Normal rate and regular rhythm.     Pulses: Normal pulses.     Heart sounds: Normal heart sounds.  Pulmonary:     Effort: Pulmonary effort is normal.  Breath sounds: Normal breath sounds.  Abdominal:     General: Bowel sounds are normal.     Palpations: Abdomen is soft.  Musculoskeletal:        General: No swelling. Normal range of motion.     Cervical back: Normal range of motion.  Skin:    General: Skin is warm and dry.  Neurological:     General: No focal deficit present.     Mental Status: He is alert and oriented to person, place, and time.  Psychiatric:        Mood and Affect: Mood normal.        Behavior: Behavior normal.        Thought Content: Thought content normal.        Judgment: Judgment normal.       Labs reviewed: Recent Labs    04/04/21 0918 10/01/21 2019 10/06/21 1041 10/19/21 0000 12/14/21 0545 12/15/21 0548 03/06/22 0333 03/07/22 0254 03/13/22 1213 03/22/22 0000  NA 143   < > 132*   < > 141   < > 137 137 136 134*  K 5.2   < > 3.4*   < > 4.2   < > 4.2 4.6 4.5 4.7  CL 101   < > 106   < > 106   < > 99 100 94* 89*  CO2 25   < > 19*   < > 25   < > '28 26 31 '$ 31*  GLUCOSE 195*   < > 324*   < > 117*   < > 164* 96 250*  --   BUN 46*   < > 38*   < > 65*   < > 51* 54* 45* 46*  CREATININE 1.38*   < > 1.07   < > 1.56*   < > 1.23 1.41* 1.21 1.1  CALCIUM 9.7   < > 8.0*   < > 8.8*   < > 8.7* 8.8* 9.2 9.6  MG 2.3  --  2.2  --  2.1  --   --   --   --   --   PHOS  --   --   --   --  5.0*  --   --   --   --   --    < > = values in this interval not displayed.   Recent Labs    12/14/21 0545 12/15/21 0548 03/01/22 0847  AST '29 29 26  '$ ALT '17 20 17  '$ ALKPHOS 58 67 76  BILITOT 1.4* 1.7*  1.4*  PROT 5.3* 5.5* 6.0*  ALBUMIN 2.8* 2.8* 3.1*   Recent Labs    12/28/21 0000 01/23/22 1442 03/01/22 0847 03/02/22 0336 03/03/22 0245 03/22/22 0000  WBC 5.6   < > 8.7 6.6 7.0 7.0  NEUTROABS 4.00  --  7.2  --   --  5.40  HGB 10.9*   < > 10.6* 10.8* 10.3* 11.2*  HCT 33*   < > 33.5* 34.5* 31.2* 34*  MCV  --    < > 105.3* 105.5* 103.7*  --   PLT 224   < > 186 199 196 285   < > = values in this interval not displayed.   Lab Results  Component Value Date   TSH 1.200 03/01/2022   Lab Results  Component Value Date   HGBA1C 5.4 12/15/2021   Lab Results  Component Value Date   CHOL 81 (L) 08/17/2018   HDL 28 (L) 08/17/2018   [place]  37 08/17/2018   TRIG 82 08/17/2018   CHOLHDL 2.9 08/17/2018    Significant Diagnostic Results in last 30 days:  No results found.  Assessment/Plan  1. Acute heart failure with preserved ejection fraction (HCC) - furosemide (LASIX) 40 MG tablet; Take 1 tablet (40 mg total) by mouth 2 (two) times daily.  Dispense: 60 tablet; Refill: 0 -  follow up with cardiology  2. Chronic atrial fibrillation with RVR (HCC) - metoprolol succinate (TOPROL-XL) 25 MG 24 hr tablet; Take 0.5 tablets (12.5 mg total) by mouth daily.  Dispense: 15 tablet; Refill: 0 - aspirin EC 81 MG tablet; Take 1 tablet (81 mg total) by mouth daily. Swallow whole.  Dispense: 30 tablet; Refill: 0  3. Type 2 diabetes mellitus with neurological complications [facility]) Lab Results  Component Value Date   HGBA1C 5.4 12/15/2021   - metFORMIN (GLUCOPHAGE) 1000 MG tablet; Take 1 tablet (1,000 mg total) by mouth 2 (two) times daily with breakfast and lunch.  Dispense: 60 tablet; Refill: 0 - gabapentin (NEURONTIN) 100 MG capsule; Take 1 capsule (100 mg total) by mouth at bedtime.  Dispense: 30 capsule; Refill: 0 - dapagliflozin propanediol (FARXIGA) 10 MG TABS tablet; Take 1 tablet (10 mg total) by mouth daily.  Dispense: 30 tablet; Refill: 0  4. Mixed hyperlipidemia - rosuvastatin  (CRESTOR) 5 MG tablet; TAKE 1 TABLET BY MOUTH ONCE DAILY AT  6PM  Dispense: 30 tablet; Refill: 0  5. Acute blood loss anemia Lab Results  Component Value Date   HGB 11.2 (A) 03/22/2022   - ferrous sulfate 325 (65 FE) MG tablet; Take 1 tablet (325 mg total) by mouth 2 (two) times daily with a meal.  Dispense: 60 tablet; Refill: 0  6. Prostate cancer ([place]) - bicalutamide (CASODEX) 50 MG tablet; Take 1 tablet (50 mg total) by mouth daily.  in the afternoon  Dispense: 30 tablet; Refill: 0  7. Gastrointestinal hemorrhage associated with gastric ulcer - pantoprazole (PROTONIX) 40 MG tablet; Take 1 tablet (40 mg total) by mouth 2 (two) times daily. Take  1 tab of 40 mg tab  by mouth twice a day till 02/16/22 then 1 tab of 40 mg by mouth daily starting 02/17/22  Dispense: 60 tablet; Refill: 0  8. Generalized weakness -  for Home health PT and OT, for therapeutic strengthening exercises    I have filled out patient's discharge paperwork and e-prescribed medications.  Patient will have home health PT, OT and Nursing.  DME provided:  None  Total discharge time: Greater than 30 minutes Greater than 50% was spent in counseling and coordination of care.   Discharge time involved coordination of the discharge process with social worker, nursing staff and therapy department. Medical justification for home health services verified.    Durenda Age, DNP, MSN, FNP-BC [facility] [phone] (Monday-Friday 8:00 a.m. - 5:00 p.m.) [phone] (after hours)

## 2022-04-02 DIAGNOSIS — Z7982 Long term (current) use of aspirin: Secondary | ICD-10-CM | POA: Diagnosis not present

## 2022-04-02 DIAGNOSIS — Z79899 Other long term (current) drug therapy: Secondary | ICD-10-CM | POA: Diagnosis not present

## 2022-04-02 DIAGNOSIS — E1151 Type 2 diabetes mellitus with diabetic peripheral angiopathy without gangrene: Secondary | ICD-10-CM | POA: Diagnosis not present

## 2022-04-02 DIAGNOSIS — L97229 Non-pressure chronic ulcer of left calf with unspecified severity: Secondary | ICD-10-CM | POA: Diagnosis not present

## 2022-04-02 DIAGNOSIS — I872 Venous insufficiency (chronic) (peripheral): Secondary | ICD-10-CM | POA: Diagnosis not present

## 2022-04-02 DIAGNOSIS — E782 Mixed hyperlipidemia: Secondary | ICD-10-CM | POA: Diagnosis not present

## 2022-04-02 DIAGNOSIS — I4821 Permanent atrial fibrillation: Secondary | ICD-10-CM | POA: Diagnosis not present

## 2022-04-02 DIAGNOSIS — I5032 Chronic diastolic (congestive) heart failure: Secondary | ICD-10-CM | POA: Diagnosis not present

## 2022-04-02 DIAGNOSIS — K259 Gastric ulcer, unspecified as acute or chronic, without hemorrhage or perforation: Secondary | ICD-10-CM | POA: Diagnosis not present

## 2022-04-02 DIAGNOSIS — Z792 Long term (current) use of antibiotics: Secondary | ICD-10-CM | POA: Diagnosis not present

## 2022-04-02 DIAGNOSIS — N1832 Chronic kidney disease, stage 3b: Secondary | ICD-10-CM | POA: Diagnosis not present

## 2022-04-02 DIAGNOSIS — D631 Anemia in chronic kidney disease: Secondary | ICD-10-CM | POA: Diagnosis not present

## 2022-04-02 DIAGNOSIS — M4802 Spinal stenosis, cervical region: Secondary | ICD-10-CM | POA: Diagnosis not present

## 2022-04-02 DIAGNOSIS — I2510 Atherosclerotic heart disease of native coronary artery without angina pectoris: Secondary | ICD-10-CM | POA: Diagnosis not present

## 2022-04-02 DIAGNOSIS — I130 Hypertensive heart and chronic kidney disease with heart failure and stage 1 through stage 4 chronic kidney disease, or unspecified chronic kidney disease: Secondary | ICD-10-CM | POA: Diagnosis not present

## 2022-04-02 DIAGNOSIS — D62 Acute posthemorrhagic anemia: Secondary | ICD-10-CM | POA: Diagnosis not present

## 2022-04-02 DIAGNOSIS — D509 Iron deficiency anemia, unspecified: Secondary | ICD-10-CM | POA: Diagnosis not present

## 2022-04-02 DIAGNOSIS — E1149 Type 2 diabetes mellitus with other diabetic neurological complication: Secondary | ICD-10-CM | POA: Diagnosis not present

## 2022-04-02 DIAGNOSIS — L97211 Non-pressure chronic ulcer of right calf limited to breakdown of skin: Secondary | ICD-10-CM | POA: Diagnosis not present

## 2022-04-02 DIAGNOSIS — M47812 Spondylosis without myelopathy or radiculopathy, cervical region: Secondary | ICD-10-CM | POA: Diagnosis not present

## 2022-04-02 DIAGNOSIS — I081 Rheumatic disorders of both mitral and tricuspid valves: Secondary | ICD-10-CM | POA: Diagnosis not present

## 2022-04-02 DIAGNOSIS — Z7984 Long term (current) use of oral hypoglycemic drugs: Secondary | ICD-10-CM | POA: Diagnosis not present

## 2022-04-02 DIAGNOSIS — M19032 Primary osteoarthritis, left wrist: Secondary | ICD-10-CM | POA: Diagnosis not present

## 2022-04-02 DIAGNOSIS — E1122 Type 2 diabetes mellitus with diabetic chronic kidney disease: Secondary | ICD-10-CM | POA: Diagnosis not present

## 2022-04-02 DIAGNOSIS — M2578 Osteophyte, vertebrae: Secondary | ICD-10-CM | POA: Diagnosis not present

## 2022-04-02 MED ORDER — BICALUTAMIDE 50 MG PO TABS: 50.0000 mg | ORAL_TABLET | Freq: Every day | ORAL | 0 refills | Status: DC

## 2022-04-02 MED ORDER — FERROUS SULFATE 325 (65 FE) MG PO TABS: 325.0000 mg | ORAL_TABLET | Freq: Two times a day (BID) | ORAL | 0 refills | Status: AC

## 2022-04-02 MED ORDER — GABAPENTIN 100 MG PO CAPS: 100.0000 mg | ORAL_CAPSULE | Freq: Every day | ORAL | 0 refills | Status: DC

## 2022-04-02 MED ORDER — PANTOPRAZOLE SODIUM 40 MG PO TBEC: 40.0000 mg | DELAYED_RELEASE_TABLET | Freq: Two times a day (BID) | ORAL | 0 refills | Status: AC

## 2022-04-02 MED ORDER — METOPROLOL SUCCINATE ER 25 MG PO TB24: 12.5000 mg | ORAL_TABLET | Freq: Every day | ORAL | 0 refills | Status: DC

## 2022-04-02 MED ORDER — ASPIRIN 81 MG PO TBEC: 81.0000 mg | DELAYED_RELEASE_TABLET | Freq: Every day | ORAL | 0 refills | Status: AC

## 2022-04-02 MED ORDER — METFORMIN HCL 1000 MG PO TABS: 1000.0000 mg | ORAL_TABLET | Freq: Two times a day (BID) | ORAL | 0 refills | Status: AC

## 2022-04-02 MED ORDER — ROSUVASTATIN CALCIUM 5 MG PO TABS: ORAL_TABLET | ORAL | 0 refills | Status: DC

## 2022-04-02 MED ORDER — DAPAGLIFLOZIN PROPANEDIOL 10 MG PO TABS: 10.0000 mg | ORAL_TABLET | Freq: Every day | ORAL | 0 refills | Status: AC

## 2022-04-02 MED ORDER — FUROSEMIDE 40 MG PO TABS: 40.0000 mg | ORAL_TABLET | Freq: Two times a day (BID) | ORAL | 0 refills | Status: DC

## 2022-04-03 DIAGNOSIS — R609 Edema, unspecified: Secondary | ICD-10-CM | POA: Diagnosis not present

## 2022-04-03 DIAGNOSIS — I10 Essential (primary) hypertension: Secondary | ICD-10-CM | POA: Diagnosis not present

## 2022-04-03 DIAGNOSIS — Z09 Encounter for follow-up examination after completed treatment for conditions other than malignant neoplasm: Secondary | ICD-10-CM | POA: Diagnosis not present

## 2022-04-03 DIAGNOSIS — I5032 Chronic diastolic (congestive) heart failure: Secondary | ICD-10-CM | POA: Diagnosis not present

## 2022-04-03 DIAGNOSIS — I4891 Unspecified atrial fibrillation: Secondary | ICD-10-CM | POA: Diagnosis not present

## 2022-04-03 DIAGNOSIS — S81802A Unspecified open wound, left lower leg, initial encounter: Secondary | ICD-10-CM | POA: Diagnosis not present

## 2022-04-03 DIAGNOSIS — D649 Anemia, unspecified: Secondary | ICD-10-CM | POA: Diagnosis not present

## 2022-04-03 DIAGNOSIS — E1140 Type 2 diabetes mellitus with diabetic neuropathy, unspecified: Secondary | ICD-10-CM | POA: Diagnosis not present

## 2022-04-04 DIAGNOSIS — I4821 Permanent atrial fibrillation: Secondary | ICD-10-CM | POA: Diagnosis not present

## 2022-04-04 DIAGNOSIS — I5032 Chronic diastolic (congestive) heart failure: Secondary | ICD-10-CM | POA: Diagnosis not present

## 2022-04-04 DIAGNOSIS — M47812 Spondylosis without myelopathy or radiculopathy, cervical region: Secondary | ICD-10-CM | POA: Diagnosis not present

## 2022-04-04 DIAGNOSIS — D631 Anemia in chronic kidney disease: Secondary | ICD-10-CM | POA: Diagnosis not present

## 2022-04-04 DIAGNOSIS — L97229 Non-pressure chronic ulcer of left calf with unspecified severity: Secondary | ICD-10-CM | POA: Diagnosis not present

## 2022-04-04 DIAGNOSIS — K259 Gastric ulcer, unspecified as acute or chronic, without hemorrhage or perforation: Secondary | ICD-10-CM | POA: Diagnosis not present

## 2022-04-04 DIAGNOSIS — E1149 Type 2 diabetes mellitus with other diabetic neurological complication: Secondary | ICD-10-CM | POA: Diagnosis not present

## 2022-04-04 DIAGNOSIS — L97211 Non-pressure chronic ulcer of right calf limited to breakdown of skin: Secondary | ICD-10-CM | POA: Diagnosis not present

## 2022-04-04 DIAGNOSIS — D62 Acute posthemorrhagic anemia: Secondary | ICD-10-CM | POA: Diagnosis not present

## 2022-04-04 DIAGNOSIS — I130 Hypertensive heart and chronic kidney disease with heart failure and stage 1 through stage 4 chronic kidney disease, or unspecified chronic kidney disease: Secondary | ICD-10-CM | POA: Diagnosis not present

## 2022-04-04 DIAGNOSIS — I081 Rheumatic disorders of both mitral and tricuspid valves: Secondary | ICD-10-CM | POA: Diagnosis not present

## 2022-04-04 DIAGNOSIS — N1832 Chronic kidney disease, stage 3b: Secondary | ICD-10-CM | POA: Diagnosis not present

## 2022-04-04 DIAGNOSIS — Z79899 Other long term (current) drug therapy: Secondary | ICD-10-CM | POA: Diagnosis not present

## 2022-04-04 DIAGNOSIS — Z7984 Long term (current) use of oral hypoglycemic drugs: Secondary | ICD-10-CM | POA: Diagnosis not present

## 2022-04-04 DIAGNOSIS — E1122 Type 2 diabetes mellitus with diabetic chronic kidney disease: Secondary | ICD-10-CM | POA: Diagnosis not present

## 2022-04-04 DIAGNOSIS — E1151 Type 2 diabetes mellitus with diabetic peripheral angiopathy without gangrene: Secondary | ICD-10-CM | POA: Diagnosis not present

## 2022-04-04 DIAGNOSIS — I872 Venous insufficiency (chronic) (peripheral): Secondary | ICD-10-CM | POA: Diagnosis not present

## 2022-04-04 DIAGNOSIS — Z7982 Long term (current) use of aspirin: Secondary | ICD-10-CM | POA: Diagnosis not present

## 2022-04-04 DIAGNOSIS — M4802 Spinal stenosis, cervical region: Secondary | ICD-10-CM | POA: Diagnosis not present

## 2022-04-04 DIAGNOSIS — M2578 Osteophyte, vertebrae: Secondary | ICD-10-CM | POA: Diagnosis not present

## 2022-04-04 DIAGNOSIS — M19032 Primary osteoarthritis, left wrist: Secondary | ICD-10-CM | POA: Diagnosis not present

## 2022-04-04 DIAGNOSIS — D509 Iron deficiency anemia, unspecified: Secondary | ICD-10-CM | POA: Diagnosis not present

## 2022-04-04 DIAGNOSIS — Z792 Long term (current) use of antibiotics: Secondary | ICD-10-CM | POA: Diagnosis not present

## 2022-04-04 DIAGNOSIS — I2510 Atherosclerotic heart disease of native coronary artery without angina pectoris: Secondary | ICD-10-CM | POA: Diagnosis not present

## 2022-04-04 DIAGNOSIS — E782 Mixed hyperlipidemia: Secondary | ICD-10-CM | POA: Diagnosis not present

## 2022-04-07 DIAGNOSIS — D62 Acute posthemorrhagic anemia: Secondary | ICD-10-CM | POA: Diagnosis not present

## 2022-04-07 DIAGNOSIS — I1310 Hypertensive heart and chronic kidney disease without heart failure, with stage 1 through stage 4 chronic kidney disease, or unspecified chronic kidney disease: Secondary | ICD-10-CM | POA: Diagnosis not present

## 2022-04-07 DIAGNOSIS — I4821 Permanent atrial fibrillation: Secondary | ICD-10-CM | POA: Diagnosis not present

## 2022-04-07 DIAGNOSIS — I5033 Acute on chronic diastolic (congestive) heart failure: Secondary | ICD-10-CM | POA: Diagnosis not present

## 2022-04-07 DIAGNOSIS — D509 Iron deficiency anemia, unspecified: Secondary | ICD-10-CM | POA: Diagnosis not present

## 2022-04-07 DIAGNOSIS — E1149 Type 2 diabetes mellitus with other diabetic neurological complication: Secondary | ICD-10-CM | POA: Diagnosis not present

## 2022-04-07 DIAGNOSIS — D631 Anemia in chronic kidney disease: Secondary | ICD-10-CM | POA: Diagnosis not present

## 2022-04-07 DIAGNOSIS — E1151 Type 2 diabetes mellitus with diabetic peripheral angiopathy without gangrene: Secondary | ICD-10-CM | POA: Diagnosis not present

## 2022-04-07 DIAGNOSIS — I2510 Atherosclerotic heart disease of native coronary artery without angina pectoris: Secondary | ICD-10-CM | POA: Diagnosis not present

## 2022-04-07 DIAGNOSIS — I872 Venous insufficiency (chronic) (peripheral): Secondary | ICD-10-CM | POA: Diagnosis not present

## 2022-04-07 DIAGNOSIS — E1122 Type 2 diabetes mellitus with diabetic chronic kidney disease: Secondary | ICD-10-CM | POA: Diagnosis not present

## 2022-04-07 DIAGNOSIS — N1832 Chronic kidney disease, stage 3b: Secondary | ICD-10-CM | POA: Diagnosis not present

## 2022-04-08 DIAGNOSIS — E1122 Type 2 diabetes mellitus with diabetic chronic kidney disease: Secondary | ICD-10-CM | POA: Diagnosis not present

## 2022-04-08 DIAGNOSIS — I5033 Acute on chronic diastolic (congestive) heart failure: Secondary | ICD-10-CM | POA: Diagnosis not present

## 2022-04-08 DIAGNOSIS — I130 Hypertensive heart and chronic kidney disease with heart failure and stage 1 through stage 4 chronic kidney disease, or unspecified chronic kidney disease: Secondary | ICD-10-CM | POA: Diagnosis not present

## 2022-04-08 DIAGNOSIS — N1832 Chronic kidney disease, stage 3b: Secondary | ICD-10-CM | POA: Diagnosis not present

## 2022-04-08 DIAGNOSIS — D631 Anemia in chronic kidney disease: Secondary | ICD-10-CM | POA: Diagnosis not present

## 2022-04-10 ENCOUNTER — Ambulatory Visit (INDEPENDENT_AMBULATORY_CARE_PROVIDER_SITE_OTHER): Payer: Medicare Other | Admitting: Podiatry

## 2022-04-10 ENCOUNTER — Encounter: Payer: Self-pay | Admitting: Podiatry

## 2022-04-10 DIAGNOSIS — M79674 Pain in right toe(s): Secondary | ICD-10-CM | POA: Diagnosis not present

## 2022-04-10 DIAGNOSIS — M79675 Pain in left toe(s): Secondary | ICD-10-CM

## 2022-04-10 DIAGNOSIS — B351 Tinea unguium: Secondary | ICD-10-CM | POA: Diagnosis not present

## 2022-04-10 DIAGNOSIS — E1142 Type 2 diabetes mellitus with diabetic polyneuropathy: Secondary | ICD-10-CM

## 2022-04-11 ENCOUNTER — Ambulatory Visit (INDEPENDENT_AMBULATORY_CARE_PROVIDER_SITE_OTHER): Payer: Medicare Other | Admitting: Physician Assistant

## 2022-04-11 ENCOUNTER — Encounter: Payer: Self-pay | Admitting: Physician Assistant

## 2022-04-11 VITALS — BP 114/56 | HR 88 | Ht 68.5 in | Wt 135.6 lb

## 2022-04-11 DIAGNOSIS — D631 Anemia in chronic kidney disease: Secondary | ICD-10-CM | POA: Diagnosis not present

## 2022-04-11 DIAGNOSIS — I071 Rheumatic tricuspid insufficiency: Secondary | ICD-10-CM

## 2022-04-11 DIAGNOSIS — I340 Nonrheumatic mitral (valve) insufficiency: Secondary | ICD-10-CM

## 2022-04-11 DIAGNOSIS — E1122 Type 2 diabetes mellitus with diabetic chronic kidney disease: Secondary | ICD-10-CM | POA: Diagnosis not present

## 2022-04-11 DIAGNOSIS — I272 Pulmonary hypertension, unspecified: Secondary | ICD-10-CM | POA: Diagnosis not present

## 2022-04-11 DIAGNOSIS — I130 Hypertensive heart and chronic kidney disease with heart failure and stage 1 through stage 4 chronic kidney disease, or unspecified chronic kidney disease: Secondary | ICD-10-CM | POA: Diagnosis not present

## 2022-04-11 DIAGNOSIS — N1832 Chronic kidney disease, stage 3b: Secondary | ICD-10-CM

## 2022-04-11 DIAGNOSIS — I50810 Right heart failure, unspecified: Secondary | ICD-10-CM | POA: Diagnosis not present

## 2022-04-11 DIAGNOSIS — I4819 Other persistent atrial fibrillation: Secondary | ICD-10-CM

## 2022-04-11 DIAGNOSIS — I5033 Acute on chronic diastolic (congestive) heart failure: Secondary | ICD-10-CM | POA: Diagnosis not present

## 2022-04-11 DIAGNOSIS — I5032 Chronic diastolic (congestive) heart failure: Secondary | ICD-10-CM

## 2022-04-11 NOTE — Patient Instructions (Signed)

## 2022-04-11 NOTE — Progress Notes (Signed)
Cardiology Office Note:    Date:  04/11/2022   ID:  [name], DOB 09/01/1933, MRN [id number]  PCP:  [name], MD   [facility] Providers Cardiologist:  [name], MD     Referring MD: [name], MD   1 month follow up.   History of Present Illness:    [name] is a 88 y.o. male with a hx of chronic diastolic heart failure, chronic atrial fibrillation not on anticoagulation due to frequent falls, valvular heart disease with history of severe aortic stenosis status post TAVR in 2014 as well as both severe MR and TR, PAD and stage IIIb CKD who presents to clinic for follow up.   Recent admission to [facility] in 03/2022 for acute on chronic diastolic heart failure and fluid overload.  Echocardiogram showed normal left ventricular EF, 55 to 60%, severe biatrial enlargement, severe MR and severe TR with severely elevated pulmonary artery systolic pressure and moderately reduced RV.   He was seen by cardiology and diuresed with IV Lasix.  Dr. Burt Knack also evaluated the patient during this hospitalization and determined that he was not a candidate for percutaneous valvular interventions including MitraClip given advanced age and fragility.  Palliative care was consulted during hospitalization.   After diuresis with IV Lasix was transitioned to p.o. Lasix along with [place].  Digoxin was also added. Continued on metoprolol. Discharged to SNF.  Discharge weight 142 lb.  He was seen in the advanced CHF clinic on 03/13/22 for TOC follow up and doing well. Weight was 134 lbs. Was having dysuria at the time and told to follow up with PCP to test and treat. If has recurrent issues with UTI, will plan to stop Farixga.  Follow-up lab work showed an elevated digoxin level despite being on a low dose.  It was advised that he stop digoxin.  Creatinine and potassium were stable.  Today the patient presents to clinic for follow up. Here with his daughter and POA.  She works at a [employer]. No CP or SOB. Chronc mild LE edema, but no orthopnea or PND. No dizziness or syncope. No blood in stool or urine. No palpitations.  He was later tested for a UTI and it was normal.  He no longer has dysuria     Past Medical History:  Diagnosis Date   Anemia    LOW PLATELETS OTHER DAY  PER PT   Anticoagulated on Coumadin 12/21/2012   Aortic stenosis    Arthritis    "left wrist; back sometimes" (01/21/2013)   Atrial fibrillation ([place])    [name], DR COOPER   Bradycardia    Exertional shortness of breath    Heart murmur    "I've had it for years; runs in the family on daddy's side" (01/21/2013)   HTN (hypertension)    Hyperlipemia 12/21/2012   Prostate cancer ([place])    "had 40 tx of radiation in 2009" (01/21/2013)   S/P aortic valve replacement with bioprosthetic valve 01/19/2013   Transcatheter Aortic Valve Replacement using 60m Sapien bioprosthetic tissue valve via transapical approach   [place] II diabetes mellitus [place]     Past Surgical History:  Procedure Laterality Date   BALLOON DILATION N/A 08/02/2020   Procedure: BALLOON DILATION;  Surgeon: [name] MD;  Location: [facility];  Service: Gastroenterology;  Laterality: N/A;   BIOPSY  10/03/2021   Procedure: BIOPSY;  Surgeon: [name] MD;  Location: [facility];  Service: Endoscopy;;   BIOPSY  12/14/2021   Procedure: BIOPSY;  Surgeon: Wilford Corner, MD;  Location: [facility];  Service: Endoscopy;;   CARDIAC CATHETERIZATION  12/16/2012   CARDIAC VALVE REPLACEMENT  01/19/2013   AVR   CATARACT EXTRACTION W/ INTRAOCULAR LENS  IMPLANT, BILATERAL     ESOPHAGOGASTRODUODENOSCOPY N/A 10/03/2021   Procedure: ESOPHAGOGASTRODUODENOSCOPY (EGD);  Surgeon: [name], MD;  Location: [facility];  Service: Endoscopy;  Laterality: N/A;   ESOPHAGOGASTRODUODENOSCOPY N/A 12/14/2021   Procedure: ESOPHAGOGASTRODUODENOSCOPY (EGD);  Surgeon: Wilford Corner, MD;  Location: [facility];  Service: Endoscopy;   Laterality: N/A;   ESOPHAGOGASTRODUODENOSCOPY (EGD) WITH PROPOFOL N/A 08/02/2020   Procedure: ESOPHAGOGASTRODUODENOSCOPY (EGD) WITH PROPOFOL;  Surgeon: [name], MD;  Location: [facility];  Service: Gastroenterology;  Laterality: N/A;   EYE SURGERY     INTRAOPERATIVE TRANSESOPHAGEAL ECHOCARDIOGRAM N/A 01/19/2013   Procedure: INTRAOPERATIVE TRANSESOPHAGEAL ECHOCARDIOGRAM;  Surgeon: [name], MD;  Location: [facility];  Service: Open Heart Surgery;  Laterality: N/A;   ORIF FOREARM FRACTURE Left 1954   "compound fx" (01/21/2013)   TRANSTHORACIC ECHOCARDIOGRAM  09/04/10, 09/07/08    Current Medications: Current Meds  Medication Sig   acetaminophen (TYLENOL) 650 MG CR tablet Take 650 mg by mouth daily as needed for pain.   aspirin EC 81 MG tablet Take 1 tablet (81 mg total) by mouth daily. Swallow whole.   bicalutamide (CASODEX) 50 MG tablet Take 1 tablet (50 mg total) by mouth daily.  in the afternoon   Chloroxylenol-Zinc Oxide (BAZA EX) Apply 1 application. topically 2 (two) times daily.  12 %, APPLY TOPICALLY TO SACRUM/BUTTOCKS   cholecalciferol (VITAMIN D3) 25 MCG (1000 UNIT) tablet Take 1,000 Units by mouth 2 (two) times daily with a meal.   dapagliflozin propanediol (FARXIGA) 10 MG TABS tablet Take 1 tablet (10 mg total) by mouth daily.   ferrous sulfate 325 (65 FE) MG tablet Take 1 tablet (325 mg total) by mouth 2 (two) times daily with a meal.   fluticasone (FLONASE) 50 MCG/ACT nasal spray Place 1 spray into both nostrils daily.   furosemide (LASIX) 40 MG tablet Take 1 tablet (40 mg total) by mouth 2 (two) times daily.   gabapentin (NEURONTIN) 100 MG capsule Take 1 capsule (100 mg total) by mouth at bedtime.   loratadine (CLARITIN) 10 MG tablet Take 10 mg by mouth daily.   metFORMIN (GLUCOPHAGE) 1000 MG tablet Take 1 tablet (1,000 mg total) by mouth 2 (two) times daily with breakfast and lunch.   metoprolol succinate (TOPROL-XL) 25 MG 24 hr tablet Take 0.5 tablets (12.5 mg total) by  mouth daily.   OXYGEN Inhale 2 L into the lungs continuous.   pantoprazole (PROTONIX) 40 MG tablet Take 1 tablet (40 mg total) by mouth 2 (two) times daily. Take  1 tab of 40 mg tab  by mouth twice a day till 02/16/22 then 1 tab of 40 mg by mouth daily starting 02/17/22   rosuvastatin (CRESTOR) 5 MG tablet TAKE 1 TABLET BY MOUTH ONCE DAILY AT  6PM   UNABLE TO FIND Take 1 Canister by mouth 2 (two) times daily. Med Name: Magic Cup   vitamin B-12 (CYANOCOBALAMIN) 1000 MCG tablet Take 1 tablet (1,000 mcg total) by mouth 2 (two) times daily with a meal.     Allergies:   Amoxicillin   Social History   Socioeconomic History   Marital status: Married    Spouse name: [name]   Number of children: 2   Years of education: Not on file   Highest education level:  High school graduate  Occupational History   Occupation: Retired  Tobacco Use   Smoking status: Former    Packs/day: 2.00    Years: 22.00    Total pack years: 44.00    Types: Cigarettes    Quit date: 09/11/1976    Years since quitting: 45.6   Smokeless tobacco: Never  Vaping Use   Vaping Use: Never used  Substance and Sexual Activity   Alcohol use: Not on file   Drug use: No   Sexual activity: Never  Other Topics Concern   Not on file  Social History Narrative   Not on file   Social Determinants of Health   Financial Resource Strain: Not on file  Food Insecurity: No Food Insecurity (03/04/2022)   Hunger Vital Sign    Worried About Running Out of Food in the Last Year: Never true    [place] in the Last Year: Never true  Transportation Needs: No Transportation Needs (03/04/2022)   PRAPARE - Hydrologist (Medical): No    Lack of Transportation (Non-Medical): No  Physical Activity: Not on file  Stress: Not on file  Social Connections: Not on file     Family History: The patient's family history includes Bone cancer (age of onset: 40) in his mother; Cancer (age of onset: 60) in his brother;  Diabetes in his mother; Heart disease in his father; Melanoma (age of onset: 37) in his mother; Pancreatic cancer (age of onset: 70) in his father; Stroke in his sister.  ROS:   Please see the history of present illness.     All other systems reviewed and are negative.  EKGs/Labs/Other Studies Reviewed:    The following studies were reviewed today:  Echo 03/02/22 Left ventricular ejection fraction, by estimation, is 55 to 60%. The left ventricle has normal function. The left ventricle has no regional wall motion abnormalities. Left ventricular diastolic parameters are indeterminate. 1. Right ventricular systolic function is moderately reduced. The right ventricular size is mildly enlarged. There is severely elevated pulmonary artery systolic pressure. 2. 3. Left atrial size was severely dilated. 4. Right atrial size was severely dilated. ERO > .4 similar to prior echo and PISA radius 1 MAC with leaflet thickening and restricted posterior leaflet . The mitral valve is normal in structure. Severe mitral valve regurgitation. No evidence of mitral stenosis. 5. 6. Tricuspid valve regurgitation is severe. Post TAVR with 26 mm Sapien 3 mild PVL seen best on SA views at 7:00 mean gradient 6 mmHg lower than TTE 11/21/21 . The aortic valve has been repaired/replaced. Aortic valve regurgitation is not visualized. No aortic stenosis is present. 7. The inferior vena cava is dilated in size with >50% respiratory variability, suggesting right atrial pressure of 8 mmHg.    EKG:  EKG is NOT ordered today.   Recent Labs: 12/05/2021: NT-Pro BNP 6,343 12/14/2021: Magnesium 2.1 03/01/2022: ALT 17; B Natriuretic Peptide 2,092.5; TSH 1.200 03/22/2022: BUN 46; Creatinine 1.1; Hemoglobin 11.2; Platelets 285; Potassium 4.7; Sodium 134  Recent Lipid Panel    Component Value Date/Time   CHOL 81 (L) 08/17/2018 0748   TRIG 82 08/17/2018 0748   HDL 28 (L) 08/17/2018 0748   CHOLHDL 2.9 08/17/2018 0748    LDLCALC 37 08/17/2018 0748     Risk Assessment/Calculations:    CHA2DS2-VASc Score = 5   This indicates a 7.2% annual risk of stroke. The patient's score is based upon: CHF History: 1 HTN History: 1 Diabetes History:  0 Stroke History: 0 Vascular Disease History: 1 Age Score: 2 Gender Score: 0          Physical Exam:    VS:  BP (!) 114/56   Pulse 88   Ht 5' 8.5" (1.74 m)   Wt 135 lb 9.6 oz (61.5 kg)   SpO2 96%   BMI 20.32 kg/m     Wt Readings from Last 3 Encounters:  04/11/22 135 lb 9.6 oz (61.5 kg)  03/28/22 134 lb 12.8 oz (61.1 kg)  03/22/22 135 lb 12.8 oz (61.6 kg)     GEN:  Well nourished, well developed in no acute distress,  HEENT: Normal NECK: No JVD; No carotid bruits LYMPHATICS: No lymphadenopathy CARDIAC: irreg irreg, 3/6 holosystoli murmur at apex. No rubs, gallops RESPIRATORY:  Clear to auscultation without rales, wheezing or rhonchi  ABDOMEN: Soft, non-tender, non-distended MUSCULOSKELETAL:  No edema; No deformity  SKIN: Warm and dry NEUROLOGIC:  Alert and oriented x 3 PSYCHIATRIC:  Normal affect   ASSESSMENT:    1. Chronic diastolic heart failure ([place])   2. RVF (right ventricular failure) ([place])   3. Mitral valve insufficiency, unspecified etiology   4. Severe tricuspid regurgitation   5. Pulmonary hypertension, unspecified ([place])   6. Persistent atrial fibrillation (H[facility])   7. Stage 3b chronic kidney disease (H[facility])    PLAN:    In order of problems listed above:  Chronic Diastolic Heart Failure with Predominant RV dysfunction -Echo 4/23 EF 50 to 55%, RV moderately reduced in the setting of severe pulmonary hypertension secondary to severe MR - NYHA Class II-III confounded by advanced age and fragility. Volume stable on exam   - Continue Lasix 40 mg bid  - Continue Toprol XL 12.5 mg daily  - Continue Farxiga 10 mg daily  - No longer on digoxin due to elevated levels despite low dose.   Severe Mitral Regurgitation/Severe Tricuspid  Regurgitation -Suspect functional MR in the setting of dilated left atria/chronic A-fib -Suspect TR functional from dilated RV subsequent to severe pulmonary hypertension -Recently evaluated by Dr. Burt Knack.  Not candidate for percutaneous valvular interventions including MitraClip given age and fragility -Heart failure optimization per above   Pulmonary hypertension -Severe right ventricular systolic pressure on recent echocardiogram, 70 mmHg  -Suspect most likely who group 2 in the setting of left-sided heart disease with chronic diastolic heart failure and severe MR -Diuretics/heart failure optimization per above   Chronic atrial fibrillation -In setting of severe MR/severe left atrial enlargement -V-rates controlled, 70s on EKG. Continue rate control therapy -Not a candidate for rhythm control.  Unlikely to maintain sinus rhythm given chronicity and left atrial size.  Also not anticoagulated given history of falls -Not watchman candidate -Continue beta-blocker digoxin  Stage IIIb CKD -Baseline creatinine 1.2-1.4    Medication Adjustments/Labs and Tests Ordered: Current medicines are reviewed at length with the patient today.  Concerns regarding medicines are outlined above.  No orders of the defined types were placed in this encounter.  No orders of the defined types were placed in this encounter.   Patient Instructions  Medication Instructions:  Your physician recommends that you continue on your current medications as directed. Please refer to the Current Medication list given to you today.  *If you need a refill on your cardiac medications before your next appointment, please call your pharmacy*   Lab Work: None ordered   If you have labs (blood work) drawn today and your tests are completely normal, you will receive your results only  by: MyChart Message (if you have MyChart) OR A paper copy in the mail If you have any lab test that is abnormal or we need to change your  treatment, we will call you to review the results.   Testing/Procedures: None ordered    Follow-Up: Follow up as scheduled    Other Instructions   Important Information About Sugar         Signed, Angelena Form, PA-C  04/11/2022 11:51 AM    [place]

## 2022-04-12 DIAGNOSIS — I130 Hypertensive heart and chronic kidney disease with heart failure and stage 1 through stage 4 chronic kidney disease, or unspecified chronic kidney disease: Secondary | ICD-10-CM | POA: Diagnosis not present

## 2022-04-12 DIAGNOSIS — E1122 Type 2 diabetes mellitus with diabetic chronic kidney disease: Secondary | ICD-10-CM | POA: Diagnosis not present

## 2022-04-12 DIAGNOSIS — N1832 Chronic kidney disease, stage 3b: Secondary | ICD-10-CM | POA: Diagnosis not present

## 2022-04-12 DIAGNOSIS — D631 Anemia in chronic kidney disease: Secondary | ICD-10-CM | POA: Diagnosis not present

## 2022-04-12 DIAGNOSIS — I5033 Acute on chronic diastolic (congestive) heart failure: Secondary | ICD-10-CM | POA: Diagnosis not present

## 2022-04-13 ENCOUNTER — Other Ambulatory Visit: Payer: Self-pay | Admitting: Adult Health

## 2022-04-13 DIAGNOSIS — K254 Chronic or unspecified gastric ulcer with hemorrhage: Secondary | ICD-10-CM

## 2022-04-14 NOTE — Progress Notes (Signed)
ANNUAL DIABETIC FOOT EXAM  Subjective: [name] presents today at risk foot care with history of diabetic neuropathy and painful elongated mycotic toenails 1-5 bilaterally which are tender when wearing enclosed shoe gear. Pain is relieved with periodic professional debridement..  Patient currently has wound on left leg being managed by Home Health skilled nursing.  Patient has been diagnosed with neuropathy and it is managed with gabapentin.  Patient's blood sugar was 196 mg/dl today. Last known  HgA1c was around 7%   Risk factors: diabetes, diabetic neuropathy, PAD, HTN, CHF, CKD, hyperlipidemia, h/o tobacco use in remission.  Aaron Pepper, MD is patient's PCP. Last visit was Apr 03, 2022.  Patient states he is back at home now. His wife is still in rehab at [facility] Past Medical History:  Diagnosis Date   Anemia    LOW PLATELETS OTHER DAY  PER PT   Anticoagulated on Coumadin 12/21/2012   Aortic stenosis    Arthritis    "left wrist; back sometimes" (01/21/2013)   Atrial fibrillation ([address])    [name], DR COOPER   Bradycardia    Exertional shortness of breath    Heart murmur    "I've had it for years; runs in the family on daddy's side" (01/21/2013)   HTN (hypertension)    Hyperlipemia 12/21/2012   Prostate cancer ([place])    "had 40 tx of radiation in 2009" (01/21/2013)   S/P aortic valve replacement with bioprosthetic valve 01/19/2013   Transcatheter Aortic Valve Replacement using 22m Sapien bioprosthetic tissue valve via transapical approach   Type II diabetes mellitus ([place]    Patient Active Problem List   Diagnosis Date Noted   Macular degeneration (senile) of retina 03/27/2022   CKD (chronic kidney disease) stage 2, GFR 60-89 ml/min 03/06/2022   Acute heart failure with preserved ejection fraction ([place] 03/01/2022   Protein-calorie malnutrition, severe 093/73/4287  Acute metabolic encephalopathy 068/09/5725  Gastroesophageal reflux disease 01/07/2022   [place]  12/20/2021   Unspecified protein-calorie malnutrition ([place] 10/11/2021   Abdominal bloating 10/07/2021   Physical deconditioning 10/05/2021   Pressure injury of skin 10/03/2021   Dizziness 10/03/2021   Coagulopathy ([place] 10/02/2021   Acute GI bleeding 10/01/2021   Hardening of the aorta (main artery of the heart) ([place] 09/24/2021   Essential hypertension 09/24/2021   Neuropathy 06/15/2021   Recurrent falls 06/15/2021   Diabetic neuropathy ([place] 03/09/2021   Diabetic retinopathy ([place] 03/09/2021   Acute blood loss anemia, symptomatic secondary to GI bleed 03/09/2021   Other thrombophilia ([place] 03/09/2021   Prostate cancer ([place] 03/09/2021   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   Nonrheumatic mitral valve regurgitation 09/11/2020   Nonrheumatic tricuspid valve regurgitation 09/11/2020   PAD (peripheral artery disease) ([place] 08/22/2017   Absolute anemia 04/02/2016   Type 2 diabetes mellitus with hyperlipidemia ([place] 04/02/2016   Long term current use of anticoagulant 04/02/2016   Macrocytosis 04/02/2016   Arthritis, degenerative 04/02/2016   History of colon polyps 04/02/2016   Carcinoma of prostate ([place] 04/02/2016   Abdominal discomfort, epigastric 12/20/2015   H/O adenomatous polyp of colon 12/20/2015   Left upper quadrant pain 03/16/2015   Chronic heart failure with preserved ejection fraction ([place] 08/30/2014   S/P aortic valve replacement with bioprosthetic valve 01/19/2013   Hyperlipemia 12/21/2012   H/O prostate cancer 12/21/2012   Anticoagulated on Coumadin 12/21/2012   Chronic atrial fibrillation with RVR ([place] 09/06/2009   Past Surgical History:  Procedure Laterality Date  BALLOON DILATION N/A 08/02/2020   Procedure: BALLOON DILATION;  Surgeon: [name], MD;  [facility];  Service: Gastroenterology;  Laterality: N/A;   BIOPSY  10/03/2021   Procedure: BIOPSY;  Surgeon: [name], MD;  Location: [facility];  Service: Endoscopy;;   BIOPSY  12/14/2021   Procedure: BIOPSY;  Surgeon: Wilford Corner, MD;  Location: [facility];  Service: Endoscopy;;   CARDIAC CATHETERIZATION  12/16/2012   CARDIAC VALVE REPLACEMENT  01/19/2013   AVR   CATARACT EXTRACTION W/ INTRAOCULAR LENS  IMPLANT, BILATERAL     ESOPHAGOGASTRODUODENOSCOPY N/A 10/03/2021   Procedure: ESOPHAGOGASTRODUODENOSCOPY (EGD);  Surgeon: [name], MD;  Location[facility];  Service: Endoscopy;  Laterality: N/A;   ESOPHAGOGASTRODUODENOSCOPY N/A 12/14/2021   Procedure: ESOPHAGOGASTRODUODENOSCOPY (EGD);  Surgeon: Wilford Corner, MD;  Location: [facility];  Service: Endoscopy;  Laterality: N/A;   ESOPHAGOGASTRODUODENOSCOPY (EGD) WITH PROPOFOL N/A 08/02/2020   Procedure: ESOPHAGOGASTRODUODENOSCOPY (EGD) WITH PROPOFOL;  Surgeon: [name], MD;  Location: [facility];  Service: Gastroenterology;  Laterality: N/A;   EYE SURGERY     INTRAOPERATIVE TRANSESOPHAGEAL ECHOCARDIOGRAM N/A 01/19/2013   Procedure: INTRAOPERATIVE TRANSESOPHAGEAL ECHOCARDIOGRAM;  Surgeon: [name], MD;  Location: [facility];  Service: Open Heart Surgery;  Laterality: N/A;   ORIF FOREARM FRACTURE Left 1952   "compound fx" (01/21/2013)   TRANSTHORACIC ECHOCARDIOGRAM  09/04/10, 09/07/08   Current Outpatient Medications on File Prior to Visit  Medication Sig Dispense Refill   acetaminophen (TYLENOL) 650 MG CR tablet Take 650 mg by mouth daily as needed for pain.     aspirin EC 81 MG tablet Take 1 tablet (81 mg total) by mouth daily. Swallow whole. 30 tablet 0   bicalutamide (CASODEX) 50 MG tablet Take 1 tablet (50 mg total) by mouth daily.  in the afternoon 30 tablet 0   Chloroxylenol-Zinc Oxide (BAZA EX) Apply 1 application. topically 2 (two) times daily.  12 %, APPLY TOPICALLY TO SACRUM/BUTTOCKS     cholecalciferol (VITAMIN D3) 25 MCG (1000 UNIT) tablet Take 1,000 Units by mouth 2 (two) times daily with a meal.     dapagliflozin propanediol (FARXIGA) 10 MG  TABS tablet Take 1 tablet (10 mg total) by mouth daily. 30 tablet 0   ferrous sulfate 325 (65 FE) MG tablet Take 1 tablet (325 mg total) by mouth 2 (two) times daily with a meal. 60 tablet 0   fluticasone (FLONASE) 50 MCG/ACT nasal spray Place 1 spray into both nostrils daily. 11.1 mL 0   furosemide (LASIX) 40 MG tablet Take 1 tablet (40 mg total) by mouth 2 (two) times daily. 60 tablet 0   gabapentin (NEURONTIN) 100 MG capsule Take 1 capsule (100 mg total) by mouth at bedtime. 30 capsule 0   loratadine (CLARITIN) 10 MG tablet Take 10 mg by mouth daily.     metFORMIN (GLUCOPHAGE) 1000 MG tablet Take 1 tablet (1,000 mg total) by mouth 2 (two) times daily with breakfast and lunch. 60 tablet 0   metoprolol succinate (TOPROL-XL) 25 MG 24 hr tablet Take 0.5 tablets (12.5 mg total) by mouth daily. 15 tablet 0   OXYGEN Inhale 2 L into the lungs continuous.     pantoprazole (PROTONIX) 40 MG tablet Take 1 tablet (40 mg total) by mouth 2 (two) times daily. Take  1 tab of 40 mg tab  by mouth twice a day till 02/16/22 then 1 tab of 40 mg by mouth daily starting 02/17/22 60 tablet 0   rosuvastatin (CRESTOR) 5 MG tablet TAKE 1 TABLET  BY MOUTH ONCE DAILY AT  6PM 30 tablet 0   UNABLE TO FIND Take 1 Canister by mouth 2 (two) times daily. Med Name: Magic Cup     vitamin B-12 (CYANOCOBALAMIN) 1000 MCG tablet Take 1 tablet (1,000 mcg total) by mouth 2 (two) times daily with a meal. 60 tablet 0   No current facility-administered medications on file prior to visit.    Allergies  Allergen Reactions   Amoxicillin Other (See Comments)    Burning of mouth, tongue and lips.  itchy, burning throat   Social History   Occupational History   Occupation: Retired  Tobacco Use   Smoking status: Former    Packs/day: 2.00    Years: 22.00    Total pack years: 44.00    Types: Cigarettes    Quit date: 09/11/1976    Years since quitting: 45.6   Smokeless tobacco: Never  Vaping Use   Vaping Use: Never used  Substance and  Sexual Activity   Alcohol use: Not on file   Drug use: No   Sexual activity: Never   Family History  Problem Relation Age of Onset   Diabetes Mother    Melanoma Mother 60   Bone cancer Mother 11   Pancreatic cancer Father 82   Heart disease Father    Stroke Sister    Cancer Brother 78       ? brain cancer   Immunization History  Administered Date(s) Administered   Influenza,inj,Quad PF,6+ Mos 07/15/2016, 08/01/2017, 07/24/2018, 08/27/2019, 10/05/2020, 07/12/2021   Influenza,inj,quad, With Preservative 07/21/2014, 08/14/2015   Influenza-Unspecified 07/05/2012   Janssen (J&J) SARS-COV-2 Vaccination 02/13/2020, 09/19/2020   Pneumococcal Conjugate PCV 7 08/04/2017   Pneumococcal Conjugate-13 04/28/2014   Pneumococcal Polysaccharide-23 09/03/2010   Tdap 02/05/2016     Review of Systems: Negative except as noted in the HPI.   Objective: There were no vitals filed for this visit.  [name] is a pleasant 88 y.o. male in NAD. AAO X 3.  Vascular Examination: CFT <4 seconds b/l LE. Faintly palpable DP pulses b/l LE. Diminished PT pulse(s) b/l LE. Pedal hair absent. No pain with calf compression b/l. Lower extremity skin temperature gradient within normal limits. Dependent edema noted b/l LE. Evidence of chronic venous insufficiency b/l LE. No ischemia or gangrene noted b/l LE. No cyanosis or clubbing noted b/l LE.  Dermatological Examination: Pedal skin thin, shiny and atrophic b/l LE. Dressings noted on left leg which are clean, dry and intact. Toenails 1-5 b/l elongated, discolored, dystrophic, thickened, crumbly with subungual debris and tenderness to dorsal palpation. No hyperkeratotic nor porokeratotic lesions present on today's visit.  Neurological Examination: Pt has subjective symptoms of neuropathy. Protective sensation diminished with 10g monofilament b/l.  Musculoskeletal Examination: Noted disuse atrophy bilaterally. No pain, crepitus or joint limitation noted  with ROM bilateral LE. Utilizes transport chair for ambulation assistance.  Footwear Assessment: Does the patient wear appropriate shoes? Yes. Does the patient need inserts/orthotics? No     Latest Ref Rng & Units 12/15/2021   12:44 AM  Hemoglobin A1C  Hemoglobin-A1c 4.8 - 5.6 % 5.4     Assessment: 1. Pain due to onychomycosis of toenails of both feet   2. Diabetic peripheral neuropathy associated with type 2 diabetes mellitus (HCC)     ADA Risk Categorization: High Risk  Patient has one or more of the following: Loss of protective sensation Absent pedal pulses Severe Foot deformity History of foot ulcer  Plan: -Patient was evaluated and treated. All patient's  and/or POA's questions/concerns answered on today's visit. -Patient undergoing wound care with home health skilled nursing on the LLE. -Diabetic foot examination performed today. -Mycotic toenails 1-5 bilaterally were debrided in length and girth with sterile nail nippers and dremel without incident. -Patient/POA to call should there be question/concern in the interim. Return in about 3 months (around 07/11/2022).  Marzetta Board, DPM

## 2022-04-15 DIAGNOSIS — N390 Urinary tract infection, site not specified: Secondary | ICD-10-CM | POA: Diagnosis not present

## 2022-04-15 DIAGNOSIS — I130 Hypertensive heart and chronic kidney disease with heart failure and stage 1 through stage 4 chronic kidney disease, or unspecified chronic kidney disease: Secondary | ICD-10-CM | POA: Diagnosis not present

## 2022-04-15 DIAGNOSIS — D631 Anemia in chronic kidney disease: Secondary | ICD-10-CM | POA: Diagnosis not present

## 2022-04-15 DIAGNOSIS — E1122 Type 2 diabetes mellitus with diabetic chronic kidney disease: Secondary | ICD-10-CM | POA: Diagnosis not present

## 2022-04-15 DIAGNOSIS — N1832 Chronic kidney disease, stage 3b: Secondary | ICD-10-CM | POA: Diagnosis not present

## 2022-04-15 DIAGNOSIS — I5033 Acute on chronic diastolic (congestive) heart failure: Secondary | ICD-10-CM | POA: Diagnosis not present

## 2022-04-15 DIAGNOSIS — M545 Low back pain, unspecified: Secondary | ICD-10-CM | POA: Diagnosis not present

## 2022-04-15 DIAGNOSIS — R8290 Unspecified abnormal findings in urine: Secondary | ICD-10-CM | POA: Diagnosis not present

## 2022-04-19 DIAGNOSIS — E1122 Type 2 diabetes mellitus with diabetic chronic kidney disease: Secondary | ICD-10-CM | POA: Diagnosis not present

## 2022-04-19 DIAGNOSIS — I130 Hypertensive heart and chronic kidney disease with heart failure and stage 1 through stage 4 chronic kidney disease, or unspecified chronic kidney disease: Secondary | ICD-10-CM | POA: Diagnosis not present

## 2022-04-19 DIAGNOSIS — N1832 Chronic kidney disease, stage 3b: Secondary | ICD-10-CM | POA: Diagnosis not present

## 2022-04-19 DIAGNOSIS — D631 Anemia in chronic kidney disease: Secondary | ICD-10-CM | POA: Diagnosis not present

## 2022-04-19 DIAGNOSIS — I5033 Acute on chronic diastolic (congestive) heart failure: Secondary | ICD-10-CM | POA: Diagnosis not present

## 2022-04-22 ENCOUNTER — Other Ambulatory Visit: Payer: Self-pay | Admitting: Adult Health

## 2022-04-22 DIAGNOSIS — E1122 Type 2 diabetes mellitus with diabetic chronic kidney disease: Secondary | ICD-10-CM | POA: Diagnosis not present

## 2022-04-22 DIAGNOSIS — D631 Anemia in chronic kidney disease: Secondary | ICD-10-CM | POA: Diagnosis not present

## 2022-04-22 DIAGNOSIS — N1832 Chronic kidney disease, stage 3b: Secondary | ICD-10-CM | POA: Diagnosis not present

## 2022-04-22 DIAGNOSIS — I5033 Acute on chronic diastolic (congestive) heart failure: Secondary | ICD-10-CM | POA: Diagnosis not present

## 2022-04-22 DIAGNOSIS — J301 Allergic rhinitis due to pollen: Secondary | ICD-10-CM

## 2022-04-22 DIAGNOSIS — I130 Hypertensive heart and chronic kidney disease with heart failure and stage 1 through stage 4 chronic kidney disease, or unspecified chronic kidney disease: Secondary | ICD-10-CM | POA: Diagnosis not present

## 2022-04-23 DIAGNOSIS — I130 Hypertensive heart and chronic kidney disease with heart failure and stage 1 through stage 4 chronic kidney disease, or unspecified chronic kidney disease: Secondary | ICD-10-CM | POA: Diagnosis not present

## 2022-04-23 DIAGNOSIS — I5033 Acute on chronic diastolic (congestive) heart failure: Secondary | ICD-10-CM | POA: Diagnosis not present

## 2022-04-23 DIAGNOSIS — D631 Anemia in chronic kidney disease: Secondary | ICD-10-CM | POA: Diagnosis not present

## 2022-04-23 DIAGNOSIS — N1832 Chronic kidney disease, stage 3b: Secondary | ICD-10-CM | POA: Diagnosis not present

## 2022-04-23 DIAGNOSIS — E1122 Type 2 diabetes mellitus with diabetic chronic kidney disease: Secondary | ICD-10-CM | POA: Diagnosis not present

## 2022-04-24 DIAGNOSIS — R300 Dysuria: Secondary | ICD-10-CM | POA: Diagnosis not present

## 2022-04-25 DIAGNOSIS — D631 Anemia in chronic kidney disease: Secondary | ICD-10-CM | POA: Diagnosis not present

## 2022-04-25 DIAGNOSIS — N1832 Chronic kidney disease, stage 3b: Secondary | ICD-10-CM | POA: Diagnosis not present

## 2022-04-25 DIAGNOSIS — E1122 Type 2 diabetes mellitus with diabetic chronic kidney disease: Secondary | ICD-10-CM | POA: Diagnosis not present

## 2022-04-25 DIAGNOSIS — I130 Hypertensive heart and chronic kidney disease with heart failure and stage 1 through stage 4 chronic kidney disease, or unspecified chronic kidney disease: Secondary | ICD-10-CM | POA: Diagnosis not present

## 2022-04-25 DIAGNOSIS — I5033 Acute on chronic diastolic (congestive) heart failure: Secondary | ICD-10-CM | POA: Diagnosis not present

## 2022-04-30 DIAGNOSIS — Z192 Hormone resistant malignancy status: Secondary | ICD-10-CM | POA: Diagnosis not present

## 2022-04-30 DIAGNOSIS — I130 Hypertensive heart and chronic kidney disease with heart failure and stage 1 through stage 4 chronic kidney disease, or unspecified chronic kidney disease: Secondary | ICD-10-CM | POA: Diagnosis not present

## 2022-04-30 DIAGNOSIS — I5033 Acute on chronic diastolic (congestive) heart failure: Secondary | ICD-10-CM | POA: Diagnosis not present

## 2022-04-30 DIAGNOSIS — R300 Dysuria: Secondary | ICD-10-CM | POA: Diagnosis not present

## 2022-04-30 DIAGNOSIS — D631 Anemia in chronic kidney disease: Secondary | ICD-10-CM | POA: Diagnosis not present

## 2022-04-30 DIAGNOSIS — N1832 Chronic kidney disease, stage 3b: Secondary | ICD-10-CM | POA: Diagnosis not present

## 2022-04-30 DIAGNOSIS — E1122 Type 2 diabetes mellitus with diabetic chronic kidney disease: Secondary | ICD-10-CM | POA: Diagnosis not present

## 2022-05-01 DIAGNOSIS — I5033 Acute on chronic diastolic (congestive) heart failure: Secondary | ICD-10-CM | POA: Diagnosis not present

## 2022-05-01 DIAGNOSIS — I130 Hypertensive heart and chronic kidney disease with heart failure and stage 1 through stage 4 chronic kidney disease, or unspecified chronic kidney disease: Secondary | ICD-10-CM | POA: Diagnosis not present

## 2022-05-01 DIAGNOSIS — E1122 Type 2 diabetes mellitus with diabetic chronic kidney disease: Secondary | ICD-10-CM | POA: Diagnosis not present

## 2022-05-01 DIAGNOSIS — D631 Anemia in chronic kidney disease: Secondary | ICD-10-CM | POA: Diagnosis not present

## 2022-05-01 DIAGNOSIS — N1832 Chronic kidney disease, stage 3b: Secondary | ICD-10-CM | POA: Diagnosis not present

## 2022-05-02 DIAGNOSIS — I5033 Acute on chronic diastolic (congestive) heart failure: Secondary | ICD-10-CM | POA: Diagnosis not present

## 2022-05-02 DIAGNOSIS — N1832 Chronic kidney disease, stage 3b: Secondary | ICD-10-CM | POA: Diagnosis not present

## 2022-05-02 DIAGNOSIS — I130 Hypertensive heart and chronic kidney disease with heart failure and stage 1 through stage 4 chronic kidney disease, or unspecified chronic kidney disease: Secondary | ICD-10-CM | POA: Diagnosis not present

## 2022-05-02 DIAGNOSIS — E1122 Type 2 diabetes mellitus with diabetic chronic kidney disease: Secondary | ICD-10-CM | POA: Diagnosis not present

## 2022-05-02 DIAGNOSIS — D631 Anemia in chronic kidney disease: Secondary | ICD-10-CM | POA: Diagnosis not present

## 2022-05-03 DIAGNOSIS — N1832 Chronic kidney disease, stage 3b: Secondary | ICD-10-CM | POA: Diagnosis not present

## 2022-05-03 DIAGNOSIS — I5033 Acute on chronic diastolic (congestive) heart failure: Secondary | ICD-10-CM | POA: Diagnosis not present

## 2022-05-03 DIAGNOSIS — D631 Anemia in chronic kidney disease: Secondary | ICD-10-CM | POA: Diagnosis not present

## 2022-05-03 DIAGNOSIS — I130 Hypertensive heart and chronic kidney disease with heart failure and stage 1 through stage 4 chronic kidney disease, or unspecified chronic kidney disease: Secondary | ICD-10-CM | POA: Diagnosis not present

## 2022-05-03 DIAGNOSIS — E1122 Type 2 diabetes mellitus with diabetic chronic kidney disease: Secondary | ICD-10-CM | POA: Diagnosis not present

## 2022-05-04 DIAGNOSIS — N1832 Chronic kidney disease, stage 3b: Secondary | ICD-10-CM | POA: Diagnosis not present

## 2022-05-04 DIAGNOSIS — D631 Anemia in chronic kidney disease: Secondary | ICD-10-CM | POA: Diagnosis not present

## 2022-05-04 DIAGNOSIS — I5033 Acute on chronic diastolic (congestive) heart failure: Secondary | ICD-10-CM | POA: Diagnosis not present

## 2022-05-04 DIAGNOSIS — E1122 Type 2 diabetes mellitus with diabetic chronic kidney disease: Secondary | ICD-10-CM | POA: Diagnosis not present

## 2022-05-04 DIAGNOSIS — I130 Hypertensive heart and chronic kidney disease with heart failure and stage 1 through stage 4 chronic kidney disease, or unspecified chronic kidney disease: Secondary | ICD-10-CM | POA: Diagnosis not present

## 2022-05-06 DIAGNOSIS — D631 Anemia in chronic kidney disease: Secondary | ICD-10-CM | POA: Diagnosis not present

## 2022-05-06 DIAGNOSIS — N1832 Chronic kidney disease, stage 3b: Secondary | ICD-10-CM | POA: Diagnosis not present

## 2022-05-06 DIAGNOSIS — I130 Hypertensive heart and chronic kidney disease with heart failure and stage 1 through stage 4 chronic kidney disease, or unspecified chronic kidney disease: Secondary | ICD-10-CM | POA: Diagnosis not present

## 2022-05-06 DIAGNOSIS — I5033 Acute on chronic diastolic (congestive) heart failure: Secondary | ICD-10-CM | POA: Diagnosis not present

## 2022-05-06 DIAGNOSIS — E1122 Type 2 diabetes mellitus with diabetic chronic kidney disease: Secondary | ICD-10-CM | POA: Diagnosis not present

## 2022-05-07 DIAGNOSIS — E1122 Type 2 diabetes mellitus with diabetic chronic kidney disease: Secondary | ICD-10-CM | POA: Diagnosis not present

## 2022-05-07 DIAGNOSIS — I5033 Acute on chronic diastolic (congestive) heart failure: Secondary | ICD-10-CM | POA: Diagnosis not present

## 2022-05-07 DIAGNOSIS — N1832 Chronic kidney disease, stage 3b: Secondary | ICD-10-CM | POA: Diagnosis not present

## 2022-05-07 DIAGNOSIS — I130 Hypertensive heart and chronic kidney disease with heart failure and stage 1 through stage 4 chronic kidney disease, or unspecified chronic kidney disease: Secondary | ICD-10-CM | POA: Diagnosis not present

## 2022-05-07 DIAGNOSIS — D631 Anemia in chronic kidney disease: Secondary | ICD-10-CM | POA: Diagnosis not present

## 2022-05-08 ENCOUNTER — Other Ambulatory Visit: Payer: Self-pay | Admitting: Adult Health

## 2022-05-08 DIAGNOSIS — L89153 Pressure ulcer of sacral region, stage 3: Secondary | ICD-10-CM | POA: Diagnosis not present

## 2022-05-08 DIAGNOSIS — N1832 Chronic kidney disease, stage 3b: Secondary | ICD-10-CM | POA: Diagnosis not present

## 2022-05-08 DIAGNOSIS — S51802A Unspecified open wound of left forearm, initial encounter: Secondary | ICD-10-CM | POA: Diagnosis not present

## 2022-05-08 DIAGNOSIS — I5031 Acute diastolic (congestive) heart failure: Secondary | ICD-10-CM

## 2022-05-08 DIAGNOSIS — E1122 Type 2 diabetes mellitus with diabetic chronic kidney disease: Secondary | ICD-10-CM | POA: Diagnosis not present

## 2022-05-08 DIAGNOSIS — I130 Hypertensive heart and chronic kidney disease with heart failure and stage 1 through stage 4 chronic kidney disease, or unspecified chronic kidney disease: Secondary | ICD-10-CM | POA: Diagnosis not present

## 2022-05-08 DIAGNOSIS — D631 Anemia in chronic kidney disease: Secondary | ICD-10-CM | POA: Diagnosis not present

## 2022-05-08 DIAGNOSIS — I5033 Acute on chronic diastolic (congestive) heart failure: Secondary | ICD-10-CM | POA: Diagnosis not present

## 2022-05-09 DIAGNOSIS — D631 Anemia in chronic kidney disease: Secondary | ICD-10-CM | POA: Diagnosis not present

## 2022-05-09 DIAGNOSIS — N1832 Chronic kidney disease, stage 3b: Secondary | ICD-10-CM | POA: Diagnosis not present

## 2022-05-09 DIAGNOSIS — I130 Hypertensive heart and chronic kidney disease with heart failure and stage 1 through stage 4 chronic kidney disease, or unspecified chronic kidney disease: Secondary | ICD-10-CM | POA: Diagnosis not present

## 2022-05-09 DIAGNOSIS — E1122 Type 2 diabetes mellitus with diabetic chronic kidney disease: Secondary | ICD-10-CM | POA: Diagnosis not present

## 2022-05-09 DIAGNOSIS — I5033 Acute on chronic diastolic (congestive) heart failure: Secondary | ICD-10-CM | POA: Diagnosis not present

## 2022-05-14 DIAGNOSIS — N1832 Chronic kidney disease, stage 3b: Secondary | ICD-10-CM | POA: Diagnosis not present

## 2022-05-14 DIAGNOSIS — D631 Anemia in chronic kidney disease: Secondary | ICD-10-CM | POA: Diagnosis not present

## 2022-05-14 DIAGNOSIS — I5033 Acute on chronic diastolic (congestive) heart failure: Secondary | ICD-10-CM | POA: Diagnosis not present

## 2022-05-14 DIAGNOSIS — E1122 Type 2 diabetes mellitus with diabetic chronic kidney disease: Secondary | ICD-10-CM | POA: Diagnosis not present

## 2022-05-14 DIAGNOSIS — I130 Hypertensive heart and chronic kidney disease with heart failure and stage 1 through stage 4 chronic kidney disease, or unspecified chronic kidney disease: Secondary | ICD-10-CM | POA: Diagnosis not present

## 2022-05-16 ENCOUNTER — Telehealth: Payer: Self-pay | Admitting: Internal Medicine

## 2022-05-16 DIAGNOSIS — I130 Hypertensive heart and chronic kidney disease with heart failure and stage 1 through stage 4 chronic kidney disease, or unspecified chronic kidney disease: Secondary | ICD-10-CM | POA: Diagnosis not present

## 2022-05-16 DIAGNOSIS — E1122 Type 2 diabetes mellitus with diabetic chronic kidney disease: Secondary | ICD-10-CM | POA: Diagnosis not present

## 2022-05-16 DIAGNOSIS — N1832 Chronic kidney disease, stage 3b: Secondary | ICD-10-CM | POA: Diagnosis not present

## 2022-05-16 DIAGNOSIS — I5033 Acute on chronic diastolic (congestive) heart failure: Secondary | ICD-10-CM | POA: Diagnosis not present

## 2022-05-16 DIAGNOSIS — D631 Anemia in chronic kidney disease: Secondary | ICD-10-CM | POA: Diagnosis not present

## 2022-05-16 NOTE — Telephone Encounter (Signed)
Spoke with [employer] nurse with [name].  She reports pt has gained 9 lbs over 2 weeks.  Pt has missed some [place] visits.  She explains that pt lives alone and daughter gives pt meds.  Daughter has not given furosemide 40 mg PO BID as ordered.  This is because pt c/o kidney pain. Beth advised pt to be seen by PCP.  PCP performed UA that was negative and referred out to urology.  Per Beth pt denies SOB.  At [facility] home visit today advised pt and daughter to take furosemide 40 mg PO BID as ordered and to call her if pt weight does not decrease after a few days.  She also advised pt to elevate legs.  Pt will call Beth if blister to right chin opens up.    Called pt to check in phone continued to ring with no answer.  Will route to MD to see if additional treatment is needed.

## 2022-05-16 NOTE — Telephone Encounter (Signed)
Pt c/o swelling: STAT is pt has developed SOB within 24 hours  If swelling, where is the swelling located? 1+ edema below the knees   How much weight have you gained and in what time span? 9 lbs  Have you gained 3 pounds in a day or 5 pounds in a week? 9 lbs in 1 week   Do you have a log of your daily weights (if so, list)?   Are you currently taking a fluid pill? Yes   Are you currently SOB? No   Have you traveled recently? No   Beth with [employer] said, pt gained  9 lbs in 1 week. Pt  been eating high sodium foods. She said pt has fluid blister in his right chin. When she spoke with pt's daughter she was advised pt is still having  intermittent kidney pain and pt is decreasing dose of his fluid. She did advised pt to take fluid pill as instructed. Need recommendation

## 2022-05-19 NOTE — Telephone Encounter (Signed)
Completely agree with your plan. Needs to resume the lasix '40mg'$  PO BID. If symptoms do not improve, we need to see him in the office later this week to adjust his diuretics.

## 2022-05-20 DIAGNOSIS — D631 Anemia in chronic kidney disease: Secondary | ICD-10-CM | POA: Diagnosis not present

## 2022-05-20 DIAGNOSIS — N1832 Chronic kidney disease, stage 3b: Secondary | ICD-10-CM | POA: Diagnosis not present

## 2022-05-20 DIAGNOSIS — E1122 Type 2 diabetes mellitus with diabetic chronic kidney disease: Secondary | ICD-10-CM | POA: Diagnosis not present

## 2022-05-20 DIAGNOSIS — I5033 Acute on chronic diastolic (congestive) heart failure: Secondary | ICD-10-CM | POA: Diagnosis not present

## 2022-05-20 DIAGNOSIS — I130 Hypertensive heart and chronic kidney disease with heart failure and stage 1 through stage 4 chronic kidney disease, or unspecified chronic kidney disease: Secondary | ICD-10-CM | POA: Diagnosis not present

## 2022-05-22 DIAGNOSIS — D631 Anemia in chronic kidney disease: Secondary | ICD-10-CM | POA: Diagnosis not present

## 2022-05-22 DIAGNOSIS — N1832 Chronic kidney disease, stage 3b: Secondary | ICD-10-CM | POA: Diagnosis not present

## 2022-05-22 DIAGNOSIS — E1122 Type 2 diabetes mellitus with diabetic chronic kidney disease: Secondary | ICD-10-CM | POA: Diagnosis not present

## 2022-05-22 DIAGNOSIS — I130 Hypertensive heart and chronic kidney disease with heart failure and stage 1 through stage 4 chronic kidney disease, or unspecified chronic kidney disease: Secondary | ICD-10-CM | POA: Diagnosis not present

## 2022-05-22 DIAGNOSIS — I5033 Acute on chronic diastolic (congestive) heart failure: Secondary | ICD-10-CM | POA: Diagnosis not present

## 2022-05-29 DIAGNOSIS — I130 Hypertensive heart and chronic kidney disease with heart failure and stage 1 through stage 4 chronic kidney disease, or unspecified chronic kidney disease: Secondary | ICD-10-CM | POA: Diagnosis not present

## 2022-05-29 DIAGNOSIS — N1832 Chronic kidney disease, stage 3b: Secondary | ICD-10-CM | POA: Diagnosis not present

## 2022-05-29 DIAGNOSIS — I5033 Acute on chronic diastolic (congestive) heart failure: Secondary | ICD-10-CM | POA: Diagnosis not present

## 2022-05-29 DIAGNOSIS — E1122 Type 2 diabetes mellitus with diabetic chronic kidney disease: Secondary | ICD-10-CM | POA: Diagnosis not present

## 2022-05-29 DIAGNOSIS — D631 Anemia in chronic kidney disease: Secondary | ICD-10-CM | POA: Diagnosis not present

## 2022-05-29 NOTE — Telephone Encounter (Signed)
[employer] nurse with [facility].  Reports pt is doing much better since starting taking furosemide as ordered.  Swelling has decreased.  No concerns voiced.

## 2022-06-03 ENCOUNTER — Ambulatory Visit: Payer: Medicare Other | Admitting: Internal Medicine

## 2022-06-03 DIAGNOSIS — L89153 Pressure ulcer of sacral region, stage 3: Secondary | ICD-10-CM | POA: Diagnosis not present

## 2022-06-04 DIAGNOSIS — I130 Hypertensive heart and chronic kidney disease with heart failure and stage 1 through stage 4 chronic kidney disease, or unspecified chronic kidney disease: Secondary | ICD-10-CM | POA: Diagnosis not present

## 2022-06-04 DIAGNOSIS — N1832 Chronic kidney disease, stage 3b: Secondary | ICD-10-CM | POA: Diagnosis not present

## 2022-06-04 DIAGNOSIS — D631 Anemia in chronic kidney disease: Secondary | ICD-10-CM | POA: Diagnosis not present

## 2022-06-04 DIAGNOSIS — E1122 Type 2 diabetes mellitus with diabetic chronic kidney disease: Secondary | ICD-10-CM | POA: Diagnosis not present

## 2022-06-04 DIAGNOSIS — I5033 Acute on chronic diastolic (congestive) heart failure: Secondary | ICD-10-CM | POA: Diagnosis not present

## 2022-06-05 DIAGNOSIS — N1832 Chronic kidney disease, stage 3b: Secondary | ICD-10-CM | POA: Diagnosis not present

## 2022-06-05 DIAGNOSIS — I130 Hypertensive heart and chronic kidney disease with heart failure and stage 1 through stage 4 chronic kidney disease, or unspecified chronic kidney disease: Secondary | ICD-10-CM | POA: Diagnosis not present

## 2022-06-05 DIAGNOSIS — I5033 Acute on chronic diastolic (congestive) heart failure: Secondary | ICD-10-CM | POA: Diagnosis not present

## 2022-06-05 DIAGNOSIS — D631 Anemia in chronic kidney disease: Secondary | ICD-10-CM | POA: Diagnosis not present

## 2022-06-05 DIAGNOSIS — E1122 Type 2 diabetes mellitus with diabetic chronic kidney disease: Secondary | ICD-10-CM | POA: Diagnosis not present

## 2022-06-06 DIAGNOSIS — I4821 Permanent atrial fibrillation: Secondary | ICD-10-CM | POA: Diagnosis not present

## 2022-06-06 DIAGNOSIS — I130 Hypertensive heart and chronic kidney disease with heart failure and stage 1 through stage 4 chronic kidney disease, or unspecified chronic kidney disease: Secondary | ICD-10-CM | POA: Diagnosis not present

## 2022-06-06 DIAGNOSIS — N1832 Chronic kidney disease, stage 3b: Secondary | ICD-10-CM | POA: Diagnosis not present

## 2022-06-06 DIAGNOSIS — E1149 Type 2 diabetes mellitus with other diabetic neurological complication: Secondary | ICD-10-CM | POA: Diagnosis not present

## 2022-06-06 DIAGNOSIS — E1151 Type 2 diabetes mellitus with diabetic peripheral angiopathy without gangrene: Secondary | ICD-10-CM | POA: Diagnosis not present

## 2022-06-06 DIAGNOSIS — E1122 Type 2 diabetes mellitus with diabetic chronic kidney disease: Secondary | ICD-10-CM | POA: Diagnosis not present

## 2022-06-06 DIAGNOSIS — I872 Venous insufficiency (chronic) (peripheral): Secondary | ICD-10-CM | POA: Diagnosis not present

## 2022-06-06 DIAGNOSIS — D631 Anemia in chronic kidney disease: Secondary | ICD-10-CM | POA: Diagnosis not present

## 2022-06-06 DIAGNOSIS — D62 Acute posthemorrhagic anemia: Secondary | ICD-10-CM | POA: Diagnosis not present

## 2022-06-06 DIAGNOSIS — I5033 Acute on chronic diastolic (congestive) heart failure: Secondary | ICD-10-CM | POA: Diagnosis not present

## 2022-06-06 DIAGNOSIS — D509 Iron deficiency anemia, unspecified: Secondary | ICD-10-CM | POA: Diagnosis not present

## 2022-06-06 DIAGNOSIS — I2510 Atherosclerotic heart disease of native coronary artery without angina pectoris: Secondary | ICD-10-CM | POA: Diagnosis not present

## 2022-06-26 ENCOUNTER — Other Ambulatory Visit: Payer: Self-pay | Admitting: Adult Health

## 2022-06-26 DIAGNOSIS — E1149 Type 2 diabetes mellitus with other diabetic neurological complication: Secondary | ICD-10-CM

## 2022-06-30 ENCOUNTER — Other Ambulatory Visit: Payer: Self-pay | Admitting: Adult Health

## 2022-06-30 DIAGNOSIS — K254 Chronic or unspecified gastric ulcer with hemorrhage: Secondary | ICD-10-CM

## 2022-07-03 ENCOUNTER — Other Ambulatory Visit: Payer: Self-pay | Admitting: Adult Health

## 2022-07-03 DIAGNOSIS — K254 Chronic or unspecified gastric ulcer with hemorrhage: Secondary | ICD-10-CM

## 2022-07-10 ENCOUNTER — Other Ambulatory Visit: Payer: Self-pay | Admitting: Adult Health

## 2022-07-10 DIAGNOSIS — I482 Chronic atrial fibrillation, unspecified: Secondary | ICD-10-CM

## 2022-07-14 ENCOUNTER — Other Ambulatory Visit: Payer: Self-pay | Admitting: Adult Health

## 2022-07-14 DIAGNOSIS — I482 Chronic atrial fibrillation, unspecified: Secondary | ICD-10-CM

## 2022-07-17 ENCOUNTER — Other Ambulatory Visit: Payer: Self-pay | Admitting: Adult Health

## 2022-07-17 DIAGNOSIS — E1149 Type 2 diabetes mellitus with other diabetic neurological complication: Secondary | ICD-10-CM

## 2022-07-17 DIAGNOSIS — I482 Chronic atrial fibrillation, unspecified: Secondary | ICD-10-CM

## 2022-07-22 DIAGNOSIS — I10 Essential (primary) hypertension: Secondary | ICD-10-CM | POA: Diagnosis not present

## 2022-07-22 DIAGNOSIS — E1140 Type 2 diabetes mellitus with diabetic neuropathy, unspecified: Secondary | ICD-10-CM | POA: Diagnosis not present

## 2022-07-22 DIAGNOSIS — E785 Hyperlipidemia, unspecified: Secondary | ICD-10-CM | POA: Diagnosis not present

## 2022-07-22 DIAGNOSIS — I5032 Chronic diastolic (congestive) heart failure: Secondary | ICD-10-CM | POA: Diagnosis not present

## 2022-07-22 DIAGNOSIS — D649 Anemia, unspecified: Secondary | ICD-10-CM | POA: Diagnosis not present

## 2022-07-22 DIAGNOSIS — Z23 Encounter for immunization: Secondary | ICD-10-CM | POA: Diagnosis not present

## 2022-07-22 DIAGNOSIS — I4891 Unspecified atrial fibrillation: Secondary | ICD-10-CM | POA: Diagnosis not present

## 2022-07-22 DIAGNOSIS — Z0000 Encounter for general adult medical examination without abnormal findings: Secondary | ICD-10-CM | POA: Diagnosis not present

## 2022-07-22 DIAGNOSIS — R195 Other fecal abnormalities: Secondary | ICD-10-CM | POA: Diagnosis not present

## 2022-07-22 DIAGNOSIS — R300 Dysuria: Secondary | ICD-10-CM | POA: Diagnosis not present

## 2022-07-22 DIAGNOSIS — I700 Atherosclerosis of aorta: Secondary | ICD-10-CM | POA: Diagnosis not present

## 2022-07-24 ENCOUNTER — Encounter: Payer: Self-pay | Admitting: Podiatry

## 2022-07-24 ENCOUNTER — Ambulatory Visit (INDEPENDENT_AMBULATORY_CARE_PROVIDER_SITE_OTHER): Payer: Medicare Other | Admitting: Podiatry

## 2022-07-24 DIAGNOSIS — B351 Tinea unguium: Secondary | ICD-10-CM

## 2022-07-24 DIAGNOSIS — E1142 Type 2 diabetes mellitus with diabetic polyneuropathy: Secondary | ICD-10-CM | POA: Diagnosis not present

## 2022-07-24 DIAGNOSIS — L601 Onycholysis: Secondary | ICD-10-CM

## 2022-07-24 DIAGNOSIS — M79675 Pain in left toe(s): Secondary | ICD-10-CM

## 2022-07-24 DIAGNOSIS — M79674 Pain in right toe(s): Secondary | ICD-10-CM | POA: Diagnosis not present

## 2022-07-25 ENCOUNTER — Other Ambulatory Visit (HOSPITAL_BASED_OUTPATIENT_CLINIC_OR_DEPARTMENT_OTHER): Payer: Self-pay | Admitting: Family Medicine

## 2022-07-25 DIAGNOSIS — R748 Abnormal levels of other serum enzymes: Secondary | ICD-10-CM

## 2022-07-26 ENCOUNTER — Telehealth: Payer: Self-pay | Admitting: Physician Assistant

## 2022-07-26 DIAGNOSIS — D649 Anemia, unspecified: Secondary | ICD-10-CM | POA: Diagnosis not present

## 2022-07-26 NOTE — Telephone Encounter (Signed)
Scheduled appt per 9/22 referral. Pt's daughter is aware of appt date and time. Pt's daughter is aware to arrive 15 mins prior to appt time and to bring and updated insurance card. Pt's daughter is aware of appt location.

## 2022-07-28 NOTE — Progress Notes (Signed)
  Subjective:  Patient ID: [name], male    DOB: [birth year],  MRN: [id number]  [name] presents to clinic today for at risk foot care with history of diabetic neuropathy and painful elongated mycotic toenails 1-5 bilaterally which are tender when wearing enclosed shoe gear. Pain is relieved with periodic professional debridement.  Patient states blood glucose was 230 mg/dl today. Last known HgA1c was unknown.  New problem(s): None.   PCP is [name], MD , and last visit was  April 15, 2022.  Allergies  Allergen Reactions   Amoxicillin Other (See Comments)    Burning of mouth, tongue and lips.  itchy, burning throat    Review of Systems: Negative except as noted in the HPI.  Objective: No changes noted in today's physical examination. [name] is a pleasant 88 y.o. male in NAD. AAO x 3. Vascular Examination: CFT <4 seconds b/l LE. Faintly palpable DP pulses b/l LE. Diminished PT pulse(s) b/l LE. Pedal hair absent. No pain with calf compression b/l. Lower extremity skin temperature gradient within normal limits. Dependent edema noted b/l LE. Evidence of chronic venous insufficiency b/l LE. No ischemia or gangrene noted b/l LE. No cyanosis or clubbing noted b/l LE.  Dermatological Examination: Pedal skin thin, shiny and atrophic b/l LE. Dressings noted on left leg which are clean, dry and intact. Toenails right great toe and 2-5 b/l elongated, discolored, dystrophic, thickened, crumbly with subungual debris and tenderness to dorsal palpation.   There is noted onchyolysis of entire nailplate of left great toe.  The nailbed remains intact. There is no erythema, no edema, no drainage, no underlying fluctuance.   No hyperkeratotic nor porokeratotic lesions present on today's visit.  Neurological Examination: Pt has subjective symptoms of neuropathy. Protective sensation diminished with 10g monofilament b/l.  Musculoskeletal Examination: Noted disuse  atrophy bilaterally. No pain, crepitus or joint limitation noted with ROM bilateral LE. Utilizes transport chair for ambulation assistance.  Assessment/Plan: 1. Pain due to onychomycosis of toenails of both feet   2. Onycholysis of toenail   3. Diabetic peripheral neuropathy associated with type 2 diabetes mellitus ([place])   -Patient's family member present. All questions/concerns addressed on today's visit. -Consent given for treatment as described below: -Examined patient. -Toenails 2-5 bilaterally and R hallux debrided in length and girth without iatrogenic bleeding with sterile nail nipper and dremel.  -Loose nailplate left great toe gently debrided to level of adherence. Digit cleansed with alcohol. triple antibiotic ointment applied to nailbed followed by light dressing. Band-aid may be removed tomorrow. No further treatment required by patient. -Patient/POA to call should there be question/concern in the interim.   Return in about 3 months (around 10/23/2022).  Marzetta Board, DPM

## 2022-07-31 DIAGNOSIS — L304 Erythema intertrigo: Secondary | ICD-10-CM | POA: Diagnosis not present

## 2022-07-31 DIAGNOSIS — X32XXXD Exposure to sunlight, subsequent encounter: Secondary | ICD-10-CM | POA: Diagnosis not present

## 2022-07-31 DIAGNOSIS — L570 Actinic keratosis: Secondary | ICD-10-CM | POA: Diagnosis not present

## 2022-08-12 NOTE — Progress Notes (Unsigned)
  [facility] [phone]   [phone]  CONSULT NOTE  REFERRING PHYSICIAN: Dr. Morrow  REASON FOR CONSULTATION:  Iron deficiency anemia   HPI [name] is a 88 y.o. male with a past medical history for GI) secondary to gastric ulcers in 12/2021, hx prostate cancer, atrial fibrillation, peripheral vascular disease, heart failure with preserved EF, aortic stenosis status post TAVR, diabetes, chronic kidney disease stage III, and coronary artery disease is referred to the clinic for anemia.  The patient had an appointment with his PCP in September 2023 for an annual wellness visit.  The patient had some blood work performed on 9/18 which showed a low hemoglobin of 7.9 despite taking iron supplements twice daily.  The patient's MCV was within normal limits at 93.5.  The patient's vitamin B12 was slightly high at 11/08/2007.  His iron studies showed low iron at 35 and low iron saturation at 10.  The patient does have some baseline chronic kidney disease with a creatinine of 1.45.  The patient's alkaline phosphatase was also noted to be elevated and his PCP is arranging for a right upper quadrant ultrasound.  If no liver pathology is noted he may consider a bone scan due to his history of prostate cancer.  The patient follows with [facility] for [facility].  The patient follows closely with Aaron. Schooler from gastroenterology.  His most recent upper endoscopy was on 12/14/2021 while admitted to the hospital.  This demonstrated a few oozing cratered gastric ulcers.   Presently, the patient denies ***bleeding?  The patient's PCP arrange for him to have stool cards performed which ***those positive or negative?  Follow-up with GI?  The patient denies any epistaxis, gingival bleeding, hemoptysis, hematemesis, melena, or hematochezia. No recent colonoscopies noted in epic.     To the patient's knowledge, he has had iron deficiency since ***.  The oldest records I  have dated back to 2014.  It appears that the anemia started around  2014? Was hospitalized it looks like in March 2014. Had anemia and thrombocytopenia. Then labs 2016 mild namiea Hbg 11.9. Then no labs until 2022, also in the hospital November 2022, Hbg 6.4. Also GI bleeding. Has received blood during hospitalizations. I see one feraheme on 2/11 during hospitalization.   iron supplement?  Prescription or over-the-counter?  Compliant??     The patient denies any other known vitamin deficiencies.  Denies any history of bariatric surgery.  He is not on any blood thinners.  He is not a vegan or vegetarian.  He does not crave ice chips. He has stage III CKD for which he follows with ***. Denies any changes in his bowel habits besides ***. Denies any abdominal pain.    Regardig symptoms of anemia, he feels like his fatigue has worsened over the *** months.  Denies any chest pain.  Denies any other abnormal bleeding or bruising including epistaxis, gingival bleeding, hemoptysis, hematemesis, or melena.  Denies any history of HIV or hepatitis.  Denies any history of autoimmune disorders.  Denies any over-the-counter herbal supplement use.    HPI  Past Medical History:  Diagnosis Date   Anemia    LOW PLATELETS OTHER DAY  PER PT   Anticoagulated on Coumadin 12/21/2012   Aortic stenosis    Arthritis    "left wrist; back sometimes" (01/21/2013)   Atrial fibrillation (HCC)    [name], [name]   Bradycardia    Exertional shortness of breath      Heart murmur    "I've had it for years; runs in the family on daddy's side" (01/21/2013)   HTN (hypertension)    Hyperlipemia 12/21/2012   Prostate cancer (HCC)    "had 40 tx of radiation in 2009" (01/21/2013)   S/P aortic valve replacement with bioprosthetic valve 01/19/2013   Transcatheter Aortic Valve Replacement using 26mm Sapien bioprosthetic tissue valve via transapical approach   Type II diabetes mellitus (HCC)     Past Surgical History:   Procedure Laterality Date   BALLOON DILATION N/A 08/02/2020   Procedure: BALLOON DILATION;  Surgeon: [name], Arya, MD;  Location: [facility];  Service: Gastroenterology;  Laterality: N/A;   BIOPSY  10/03/2021   Procedure: BIOPSY;  Surgeon: [name], Marc, MD;  Location: [facility];  Service: Endoscopy;;   BIOPSY  12/14/2021   Procedure: BIOPSY;  Surgeon: [name], Vincent, MD;  Location: [facility];  Service: Endoscopy;;   CARDIAC CATHETERIZATION  12/16/2012   CARDIAC VALVE REPLACEMENT  01/19/2013   AVR   CATARACT EXTRACTION W/ INTRAOCULAR LENS  IMPLANT, BILATERAL     ESOPHAGOGASTRODUODENOSCOPY N/A 10/03/2021   Procedure: ESOPHAGOGASTRODUODENOSCOPY (EGD);  Surgeon: [name], Marc, MD;  Location: [facility];  Service: Endoscopy;  Laterality: N/A;   ESOPHAGOGASTRODUODENOSCOPY N/A 12/14/2021   Procedure: ESOPHAGOGASTRODUODENOSCOPY (EGD);  Surgeon: [name], Vincent, MD;  Location: [facility];  Service: Endoscopy;  Laterality: N/A;   ESOPHAGOGASTRODUODENOSCOPY (EGD) WITH PROPOFOL N/A 08/02/2020   Procedure: ESOPHAGOGASTRODUODENOSCOPY (EGD) WITH PROPOFOL;  Surgeon: [name], Arya, MD;  Location: [facility];  Service: Gastroenterology;  Laterality: N/A;   EYE SURGERY     INTRAOPERATIVE TRANSESOPHAGEAL ECHOCARDIOGRAM N/A 01/19/2013   Procedure: INTRAOPERATIVE TRANSESOPHAGEAL ECHOCARDIOGRAM;  Surgeon: Aaron H Owen, MD;  Location: [facility];  Service: Open Heart Surgery;  Laterality: N/A;   ORIF FOREARM FRACTURE Left 1954   "compound fx" (01/21/2013)   TRANSTHORACIC ECHOCARDIOGRAM  09/04/10, 09/07/08    Family History  Problem Relation Age of Onset   Diabetes Mother    Melanoma Mother 83   Bone cancer Mother 83   Pancreatic cancer Father 73   Heart disease Father    Stroke Sister    Cancer Brother 20       ? brain cancer    Social History Social History   Tobacco Use   Smoking status: Former    Packs/day: 2.00    Years: 22.00    Total pack years: 44.00    Types: Cigarettes    Quit  date: 09/11/1976    Years since quitting: 45.9   Smokeless tobacco: Never  Vaping Use   Vaping Use: Never used  Substance Use Topics   Drug use: No    Allergies  Allergen Reactions   Amoxicillin Other (See Comments)    Burning of mouth, tongue and lips.  itchy, burning throat    Current Outpatient Medications  Medication Sig Dispense Refill   acetaminophen (TYLENOL) 650 MG CR tablet Take 650 mg by mouth daily as needed for pain.     aspirin EC 81 MG tablet Take 1 tablet (81 mg total) by mouth daily. Swallow whole. 30 tablet 0   bicalutamide (CASODEX) 50 MG tablet Take 1 tablet (50 mg total) by mouth daily.  in the afternoon 30 tablet 0   cephALEXin (KEFLEX) 500 MG capsule Take 500 mg by mouth 3 (three) times daily.     Chloroxylenol-Zinc Oxide (BAZA EX) Apply 1 application. topically 2 (two) times daily.  12 %, APPLY TOPICALLY TO SACRUM/BUTTOCKS     cholecalciferol (VITAMIN D3) 25   MCG (1000 UNIT) tablet Take 1,000 Units by mouth 2 (two) times daily with a meal.     dapagliflozin propanediol (FARXIGA) 10 MG TABS tablet Take 1 tablet (10 mg total) by mouth daily. 30 tablet 0   ferrous sulfate 325 (65 FE) MG tablet Take 1 tablet (325 mg total) by mouth 2 (two) times daily with a meal. 60 tablet 0   fluticasone (FLONASE) 50 MCG/ACT nasal spray Place 1 spray into both nostrils daily. 11.1 mL 0   furosemide (LASIX) 40 MG tablet Take 1 tablet (40 mg total) by mouth 2 (two) times daily. 60 tablet 0   gabapentin (NEURONTIN) 100 MG capsule Take 1 capsule (100 mg total) by mouth at bedtime. 30 capsule 0   glipiZIDE (GLUCOTROL XL) 2.5 MG 24 hr tablet Take 2.5 mg by mouth every morning.     loratadine (CLARITIN) 10 MG tablet Take 10 mg by mouth daily.     metFORMIN (GLUCOPHAGE) 1000 MG tablet Take 1 tablet (1,000 mg total) by mouth 2 (two) times daily with breakfast and lunch. 60 tablet 0   metoprolol succinate (TOPROL-XL) 25 MG 24 hr tablet Take 0.5 tablets (12.5 mg total) by mouth daily. 15  tablet 0   OXYGEN Inhale 2 L into the lungs continuous.     pantoprazole (PROTONIX) 40 MG tablet Take 1 tablet (40 mg total) by mouth 2 (two) times daily. Take  1 tab of 40 mg tab  by mouth twice a day till 02/16/22 then 1 tab of 40 mg by mouth daily starting 02/17/22 60 tablet 0   rosuvastatin (CRESTOR) 5 MG tablet TAKE 1 TABLET BY MOUTH ONCE DAILY AT  6PM 30 tablet 0   UNABLE TO FIND Take 1 Canister by mouth 2 (two) times daily. Med Name: Magic Cup     vitamin B-12 (CYANOCOBALAMIN) 1000 MCG tablet Take 1 tablet (1,000 mcg total) by mouth 2 (two) times daily with a meal. 60 tablet 0   No current facility-administered medications for this visit.    REVIEW OF SYSTEMS:   Review of Systems  Constitutional: Negative for appetite change, chills, fatigue, fever and unexpected weight change.  HENT:   Negative for mouth sores, nosebleeds, sore throat and trouble swallowing.   Eyes: Negative for eye problems and icterus.  Respiratory: Negative for cough, hemoptysis, shortness of breath and wheezing.   Cardiovascular: Negative for chest pain and leg swelling.  Gastrointestinal: Negative for abdominal pain, constipation, diarrhea, nausea and vomiting.  Genitourinary: Negative for bladder incontinence, difficulty urinating, dysuria, frequency and hematuria.   Musculoskeletal: Negative for back pain, gait problem, neck pain and neck stiffness.  Skin: Negative for itching and rash.  Neurological: Negative for dizziness, extremity weakness, gait problem, headaches, light-headedness and seizures.  Hematological: Negative for adenopathy. Does not bruise/bleed easily.  Psychiatric/Behavioral: Negative for confusion, depression and sleep disturbance. The patient is not nervous/anxious.     PHYSICAL EXAMINATION:  There were no vitals taken for this visit.  ECOG PERFORMANCE STATUS: {CHL ONC ECOG [id number]  Physical Exam  Constitutional: Oriented to person, place, and time and well-developed,  well-nourished, and in no distress. No distress.  HENT:  Head: Normocephalic and atraumatic.  Mouth/Throat: Oropharynx is clear and moist. No oropharyngeal exudate.  Eyes: Conjunctivae are normal. Right eye exhibits no discharge. Left eye exhibits no discharge. No scleral icterus.  Neck: Normal range of motion. Neck supple.  Cardiovascular: Normal rate, regular rhythm, normal heart sounds and intact distal pulses.   Pulmonary/Chest: Effort normal and breath  sounds normal. No respiratory distress. No wheezes. No rales.  Abdominal: Soft. Bowel sounds are normal. Exhibits no distension and no mass. There is no tenderness.  Musculoskeletal: Normal range of motion. Exhibits no edema.  Lymphadenopathy:    No cervical adenopathy.  Neurological: Alert and oriented to person, place, and time. Exhibits normal muscle tone. Gait normal. Coordination normal.  Skin: Skin is warm and dry. No rash noted. Not diaphoretic. No erythema. No pallor.  Psychiatric: Mood, memory and judgment normal.  Vitals reviewed.  LABORATORY DATA: Lab Results  Component Value Date   WBC 7.0 03/22/2022   HGB 11.2 (A) 03/22/2022   HCT 34 (A) 03/22/2022   MCV 103.7 (H) 03/03/2022   PLT 285 03/22/2022      Chemistry      Component Value Date/Time   NA 134 (A) 03/22/2022 0000   K 4.7 03/22/2022 0000   CL 89 (A) 03/22/2022 0000   CO2 31 (A) 03/22/2022 0000   BUN 46 (A) 03/22/2022 0000   CREATININE 1.1 03/22/2022 0000   CREATININE 1.21 03/13/2022 1213   GLU 396 03/22/2022 0000      Component Value Date/Time   CALCIUM 9.6 03/22/2022 0000   ALKPHOS 76 03/01/2022 0847   AST 26 03/01/2022 0847   ALT 17 03/01/2022 0847   BILITOT 1.4 (H) 03/01/2022 0847   BILITOT 0.5 08/17/2018 0748       RADIOGRAPHIC STUDIES: No results found.  ASSESSMENT: This is a very pleasant 89-year-old male referred to the clinic for iron deficiency anemia.   The patient was seen with Aaron. Mohamed today.  The patient had several lab  studies performed including a CBC, CMP, sample blood bank, B12, folate, iron studies, ferritin, SPEP with immunofixation, and ***.   His labs from today demonstrate ***.  Check CMP for alk phos?  The patient's PCP arrange for ultrasound of the liver.  If this comes back normal they may consider a bone scan due to his history of prostate cancer.  The patient follows closely with urology for PSAs.  We will arrange for IV iron infusions with Venofer 300 mg weekly x3 starting next week.  The patient was encouraged to continue taking iron supplement daily.  He was encouraged to take this with vitamin C as that helps with absorption.  He was also given a handout of iron rich food and instructed to increase his dietary intake of iron rich food.  The patient does have a history of GI hemorrhage and gastric ulcers for which she follows closely with GI and cardiology.  He is off Coumadin due to risk of bleeding.  Follow-up with GI?  Follow-up?  Blood transfusion?  The patient was advised to call immediately if he has any concerning symptoms in the interval. The patient voices understanding of current disease status and treatment options and is in agreement with the current care plan. All questions were answered. The patient knows to call the clinic with any problems, questions or concerns. We can certainly see the patient much sooner if necessary     PLAN:  The patient voices understanding of current disease status and treatment options and is in agreement with the current care plan.  All questions were answered. The patient knows to call the clinic with any problems, questions or concerns. We can certainly see the patient much sooner if necessary.  Thank you so much for allowing me to participate in the care of [name]. I will continue to follow up the patient   with you and assist in his care.  I spent {CHL ONC TIME VISIT - [id number]:[id number]} counseling the patient face to face. The total  time spent in the appointment was {CHL ONC TIME VISIT - [id number]:[id number]}.  Disclaimer: This note was dictated with voice recognition software. Similar sounding words can inadvertently be transcribed and may not be corrected upon review.   Trayven Lumadue L Deanthony Maull August 12, 2022, 4:05 PM

## 2022-08-13 ENCOUNTER — Inpatient Hospital Stay: Payer: Medicare Other | Attending: Physician Assistant | Admitting: Physician Assistant

## 2022-08-13 ENCOUNTER — Other Ambulatory Visit: Payer: Self-pay | Admitting: *Deleted

## 2022-08-13 ENCOUNTER — Other Ambulatory Visit: Payer: Self-pay | Admitting: Physician Assistant

## 2022-08-13 ENCOUNTER — Inpatient Hospital Stay: Payer: Medicare Other

## 2022-08-13 VITALS — BP 119/64 | HR 80 | Temp 97.6°F | Resp 15 | Wt 149.8 lb

## 2022-08-13 DIAGNOSIS — Z87891 Personal history of nicotine dependence: Secondary | ICD-10-CM

## 2022-08-13 DIAGNOSIS — Z8546 Personal history of malignant neoplasm of prostate: Secondary | ICD-10-CM | POA: Insufficient documentation

## 2022-08-13 DIAGNOSIS — D509 Iron deficiency anemia, unspecified: Secondary | ICD-10-CM | POA: Insufficient documentation

## 2022-08-13 DIAGNOSIS — D649 Anemia, unspecified: Secondary | ICD-10-CM

## 2022-08-13 DIAGNOSIS — D500 Iron deficiency anemia secondary to blood loss (chronic): Secondary | ICD-10-CM

## 2022-08-13 DIAGNOSIS — N183 Chronic kidney disease, stage 3 unspecified: Secondary | ICD-10-CM | POA: Diagnosis not present

## 2022-08-13 DIAGNOSIS — D62 Acute posthemorrhagic anemia: Secondary | ICD-10-CM

## 2022-08-13 LAB — FERRITIN: Ferritin: 30 ng/mL (ref 24–336)

## 2022-08-13 LAB — CBC WITH DIFFERENTIAL (CANCER CENTER ONLY)
Abs Immature Granulocytes: 0.02 10*3/uL (ref 0.00–0.07)
Basophils Absolute: 0.1 10*3/uL (ref 0.0–0.1)
Basophils Relative: 1 %
Eosinophils Absolute: 0.4 10*3/uL (ref 0.0–0.5)
Eosinophils Relative: 5 %
HCT: 26.1 % — ABNORMAL LOW (ref 39.0–52.0)
Hemoglobin: 8.1 g/dL — ABNORMAL LOW (ref 13.0–17.0)
Immature Granulocytes: 0 %
Lymphocytes Relative: 11 %
Lymphs Abs: 0.8 10*3/uL (ref 0.7–4.0)
MCH: 31.2 pg (ref 26.0–34.0)
MCHC: 31 g/dL (ref 30.0–36.0)
MCV: 100.4 fL — ABNORMAL HIGH (ref 80.0–100.0)
Monocytes Absolute: 0.8 10*3/uL (ref 0.1–1.0)
Monocytes Relative: 12 %
Neutro Abs: 4.7 10*3/uL (ref 1.7–7.7)
Neutrophils Relative %: 71 %
Platelet Count: 200 10*3/uL (ref 150–400)
RBC: 2.6 MIL/uL — ABNORMAL LOW (ref 4.22–5.81)
RDW: 18 % — ABNORMAL HIGH (ref 11.5–15.5)
WBC Count: 6.6 10*3/uL (ref 4.0–10.5)
nRBC: 0 % (ref 0.0–0.2)

## 2022-08-13 LAB — IRON AND IRON BINDING CAPACITY (CC-WL,HP ONLY)
Iron: 68 ug/dL (ref 45–182)
Saturation Ratios: 16 % — ABNORMAL LOW (ref 17.9–39.5)
TIBC: 427 ug/dL (ref 250–450)
UIBC: 359 ug/dL (ref 117–376)

## 2022-08-13 LAB — CMP (CANCER CENTER ONLY)
ALT: 21 U/L (ref 0–44)
AST: 30 U/L (ref 15–41)
Albumin: 3.8 g/dL (ref 3.5–5.0)
Alkaline Phosphatase: 411 U/L — ABNORMAL HIGH (ref 38–126)
Anion gap: 8 (ref 5–15)
BUN: 55 mg/dL — ABNORMAL HIGH (ref 8–23)
CO2: 26 mmol/L (ref 22–32)
Calcium: 8.9 mg/dL (ref 8.9–10.3)
Chloride: 101 mmol/L (ref 98–111)
Creatinine: 1.75 mg/dL — ABNORMAL HIGH (ref 0.61–1.24)
GFR, Estimated: 37 mL/min — ABNORMAL LOW (ref >60)
Glucose, Bld: 225 mg/dL — ABNORMAL HIGH (ref 70–99)
Potassium: 4.9 mmol/L (ref 3.5–5.1)
Sodium: 135 mmol/L (ref 135–145)
Total Bilirubin: 1 mg/dL (ref 0.3–1.2)
Total Protein: 7.1 g/dL (ref 6.5–8.1)

## 2022-08-13 LAB — SAMPLE TO BLOOD BANK

## 2022-08-13 LAB — PREPARE RBC (CROSSMATCH)

## 2022-08-13 LAB — VITAMIN B12: Vitamin B-12: 2314 pg/mL — ABNORMAL HIGH (ref 180–914)

## 2022-08-13 LAB — FOLATE: Folate: 32.5 ng/mL (ref >5.9)

## 2022-08-14 ENCOUNTER — Encounter: Payer: Self-pay | Admitting: Physician Assistant

## 2022-08-14 ENCOUNTER — Other Ambulatory Visit: Payer: Self-pay | Admitting: Gastroenterology

## 2022-08-14 ENCOUNTER — Other Ambulatory Visit: Payer: Self-pay | Admitting: Physician Assistant

## 2022-08-14 ENCOUNTER — Telehealth: Payer: Self-pay | Admitting: Internal Medicine

## 2022-08-14 ENCOUNTER — Other Ambulatory Visit: Payer: Self-pay

## 2022-08-14 ENCOUNTER — Inpatient Hospital Stay: Payer: Medicare Other

## 2022-08-14 DIAGNOSIS — D649 Anemia, unspecified: Secondary | ICD-10-CM

## 2022-08-14 DIAGNOSIS — R1030 Lower abdominal pain, unspecified: Secondary | ICD-10-CM

## 2022-08-14 DIAGNOSIS — D509 Iron deficiency anemia, unspecified: Secondary | ICD-10-CM | POA: Diagnosis not present

## 2022-08-14 DIAGNOSIS — K259 Gastric ulcer, unspecified as acute or chronic, without hemorrhage or perforation: Secondary | ICD-10-CM | POA: Diagnosis not present

## 2022-08-14 DIAGNOSIS — R748 Abnormal levels of other serum enzymes: Secondary | ICD-10-CM | POA: Diagnosis not present

## 2022-08-14 DIAGNOSIS — I5032 Chronic diastolic (congestive) heart failure: Secondary | ICD-10-CM

## 2022-08-14 DIAGNOSIS — K921 Melena: Secondary | ICD-10-CM | POA: Diagnosis not present

## 2022-08-14 DIAGNOSIS — N183 Chronic kidney disease, stage 3 unspecified: Secondary | ICD-10-CM | POA: Diagnosis not present

## 2022-08-14 DIAGNOSIS — I50810 Right heart failure, unspecified: Secondary | ICD-10-CM

## 2022-08-14 MED ORDER — FUROSEMIDE 80 MG PO TABS: 80.0000 mg | ORAL_TABLET | Freq: Two times a day (BID) | ORAL | 0 refills | Status: DC

## 2022-08-14 MED ORDER — DIPHENHYDRAMINE HCL 25 MG PO CAPS
25.0000 mg | ORAL_CAPSULE | Freq: Once | ORAL | Status: AC
  Administered 2022-08-14: 25 mg via ORAL
  Filled 2022-08-14: qty 1

## 2022-08-14 MED ORDER — ACETAMINOPHEN 325 MG PO TABS
650.0000 mg | ORAL_TABLET | Freq: Once | ORAL | Status: DC
  Filled 2022-08-14: qty 2

## 2022-08-14 MED ORDER — SODIUM CHLORIDE 0.9% IV SOLUTION
250.0000 mL | Freq: Once | INTRAVENOUS | Status: AC
  Administered 2022-08-14: 250 mL via INTRAVENOUS

## 2022-08-14 NOTE — Telephone Encounter (Signed)
Called daughter informed of MD verbal order to increase furosemide to 80 mg PO BID x 5 days.  Pt scheduled for OV on 08/19/22 with Harriet Pho, [place].  Advised will need BMP at [place].  Daughter had no questions or concerns.

## 2022-08-14 NOTE — Telephone Encounter (Signed)
Pt c/o swelling: STAT is pt has developed SOB within 24 hours  How much weight have you gained and in what time span? 10 lbs in 10 days  If swelling, where is the swelling located? Legs and maybe abdomen   Are you currently taking a fluid pill? Yes   Are you currently SOB? No, only with activity   Do you have a log of your daily weights (if so, list)?   Have you gained 3 pounds in a day or 5 pounds in a week? 5 lbs in a week   Have you traveled recently? No

## 2022-08-14 NOTE — Telephone Encounter (Signed)
Daughter reports pt has had a 10 lb weight gain in 1 week.  Pt wt last week 140 lbs this week 150 lbs.  Notes pt has SOB with activity and leg swelling.   Daughter reports pt is taking furosemide 40 mg PO BID as ordered.   No recent changes in diet/ salt intake.  Pt elevates legs throughout the day.  Pt expressed to daughter that UOP is not as much as it normally is.  Per daughter has adequate PO intake.  Today 127/65-63 no daily weights to report.

## 2022-08-14 NOTE — Patient Instructions (Signed)

## 2022-08-15 ENCOUNTER — Telehealth: Payer: Self-pay | Admitting: Pharmacy Technician

## 2022-08-15 LAB — PROTEIN ELECTROPHORESIS, SERUM, WITH REFLEX
A/G Ratio: 1 (ref 0.7–1.7)
Albumin ELP: 3.3 g/dL (ref 2.9–4.4)
Alpha-1-Globulin: 0.4 g/dL (ref 0.0–0.4)
Alpha-2-Globulin: 0.9 g/dL (ref 0.4–1.0)
Beta Globulin: 1 g/dL (ref 0.7–1.3)
Gamma Globulin: 1.1 g/dL (ref 0.4–1.8)
Globulin, Total: 3.3 g/dL (ref 2.2–3.9)
Total Protein ELP: 6.6 g/dL (ref 6.0–8.5)

## 2022-08-15 NOTE — Telephone Encounter (Signed)
Dr. Toya Smothers, Jul[facility] Rainier note:  Auth Submission: NO AUTH NEEDED Payer: [employer] Medication & CPT/J Code(s) submitted: Venofer (Iron Sucrose) J1756 Route of submission (phone, fax, portal):  Phone # Fax # Auth type: Buy/Bill Units/visits requested: X3 DOSES Reference number: [id number] Approval from: 08/15/22 to 11/03/22

## 2022-08-17 LAB — TYPE AND SCREEN
ABO/RH(D): O NEG
Antibody Screen: NEGATIVE
Unit division: 0
Unit division: 0

## 2022-08-17 LAB — BPAM RBC
Blood Product Expiration Date: 202311062359
Blood Product Expiration Date: 202311092359
ISSUE DATE / TIME: 202310110921
Unit Type and Rh: 9500
Unit Type and Rh: 9500

## 2022-08-18 NOTE — Progress Notes (Unsigned)
Office Visit    Patient Name: [name] Date of Encounter: 08/19/2022  PCP:  [name], [place]  Cardiologist:  [name], MD  Advanced Practice Provider:  No care team member to display Electrophysiologist:  None    HPI    [name] is a 88 y.o. male with a past medical history of chronic diastolic heart failure, chronic atrial fibrillation not on anticoagulation due to frequent falls, valvular heart disease with history of severe aortic stenosis status post TAVR in 2014 as well as both severe MR and TR, PAD and stage IIIb CKD presents today for follow-up.  He was admitted to [facility] 03/2022 for acute on chronic diastolic heart failure and fluid overload.  Echo showed normal LVEF 55 to 60%, severe biatrial enlargement, severe MR and severe TR with severely elevated pulmonary artery systolic pressure and moderately reduced RV.  He was seen by cardiology and diuresed with IV Lasix.  Dr. Burt Knack also evaluated the patient during his hospitalization and determined he was not a candidate for percutaneous valvular intervention including MitraClip given advanced age and fragility.  Palliative care was consulted during hospitalization.  After IV diuretics were administered he was transitioned to p.o. Lasix along with [place].  Digoxin was also added.  He was continued on metoprolol.  Discharged to SNF.  Discharge weight was 142 pounds.  He was seen in advanced CHF clinic 03/13/2022 for TOC follow-up and doing well.  Weight was 134 pounds.  He was having dysuria at that time and told to follow-up with PCP to test treat.  If he has recurrent issues with UTI, will plan to stop [place].  Follow-up labs showed elevated digoxin level despite being on a low-dose.  He was advised to stop digoxin.  Creatinine and potassium are stable.  He was seen 04/11/2022 and did not have any shortness of breath or chest pain.  He did have chronic  mild lower extremity edema, but no orthopnea or PND.  No dizziness or syncope.  He was later tested for UTI and UA was normal. His dry weight is 140.   Today, he states he is still having some shortness of breath.  He also is having some bilateral lower extremity edema.  This got better with the increase in his Lasix to 80 mg twice a day but today he started back on his 40 mg twice a day and has gained a pound.  He is now 146 pounds and his dry weight is 140.  We have ordered a BMP to check his kidney function and electrolytes today.  After that we will determine whether or not we need to add a different diuretic or go back up on his Lasix.  Reports no chest pain, pressure, or tightness. No edema, orthopnea, PND. Reports no palpitations.    Past Medical History    Past Medical History:  Diagnosis Date   Anemia    LOW PLATELETS OTHER DAY  PER PT   Anticoagulated on Coumadin 12/21/2012   Aortic stenosis    Arthritis    "left wrist; back sometimes" (01/21/2013)   Atrial fibrillation ([place])    [name], DR COOPER   Bradycardia    Exertional shortness of breath    Heart murmur    "I've had it for years; runs in the family on daddy's side" (01/21/2013)   HTN (hypertension)    Hyperlipemia 12/21/2012   Prostate cancer ([place])    "  had 40 tx of radiation in 2009" (01/21/2013)   S/P aortic valve replacement with bioprosthetic valve 01/19/2013   Transcatheter Aortic Valve Replacement using 64m Sapien bioprosthetic tissue valve via transapical approach   [place]    Past Surgical History:  Procedure Laterality Date   BALLOON DILATION N/A 08/02/2020   Procedure: BALLOON DILATION;  Surgeon: [name] MD;  Location: [facility];  Service: Gastroenterology;  Laterality: N/A;   BIOPSY  10/03/2021   Procedure: BIOPSY;  Surgeon: [name] MD;  Location: [facility];  Service: Endoscopy;;   BIOPSY  12/14/2021   Procedure: BIOPSY;  Surgeon: [name] MD;  [place]  [facility]  Service: Endoscopy;;   CARDIAC CATHETERIZATION  12/16/2012   CARDIAC VALVE REPLACEMENT  01/19/2013   AVR   CATARACT EXTRACTION W/ INTRAOCULAR LENS  IMPLANT, BILATERAL     ESOPHAGOGASTRODUODENOSCOPY N/A 10/03/2021   Procedure: ESOPHAGOGASTRODUODENOSCOPY (EGD);  Surgeon: [name] MD;  [facility];  Service: Endoscopy;  Laterality: N/A;   ESOPHAGOGASTRODUODENOSCOPY N/A 12/14/2021   Procedure: ESOPHAGOGASTRODUODENOSCOPY (EGD);  Surgeon: [name] MD;  [facility]  Service: Endoscopy;  Laterality: N/A;   ESOPHAGOGASTRODUODENOSCOPY (EGD) WITH PROPOFOL N/A 08/02/2020   Procedure: ESOPHAGOGASTRODUODENOSCOPY (EGD) WITH PROPOFOL;  Surgeon: [name] MD;  Location: [facility];  Service: Gastroenterology;  Laterality: N/A;   EYE SURGERY     INTRAOPERATIVE TRANSESOPHAGEAL ECHOCARDIOGRAM N/A 01/19/2013   Procedure: INTRAOPERATIVE TRANSESOPHAGEAL ECHOCARDIOGRAM;  Surgeon: CRexene Alberts MD;  [facility]  Service: Open Heart Surgery;  Laterality: N/A;   ORIF FOREARM FRACTURE Left 1954   "compound fx" (01/21/2013)   TRANSTHORACIC ECHOCARDIOGRAM  09/04/10, 09/07/08    Allergies  Allergies  Allergen Reactions   Amoxicillin Other (See Comments)    Burning of mouth, tongue and lips.  itchy, burning throat    EKGs/Labs/Other Studies Reviewed:   The following studies were reviewed today: Echo 03/02/22 Left ventricular ejection fraction, by estimation, is 55 to 60%. The left ventricle has normal function. The left ventricle has no regional wall motion abnormalities. Left ventricular diastolic parameters are indeterminate. 1. Right ventricular systolic function is moderately reduced. The right ventricular size is mildly enlarged. There is severely elevated pulmonary artery systolic pressure. 2. 3. Left atrial size was severely dilated. 4. Right atrial size was severely dilated. ERO > .4 similar to prior echo and PISA radius 1 MAC with leaflet  thickening and restricted posterior leaflet . The mitral valve is normal in structure. Severe mitral valve regurgitation. No evidence of mitral stenosis. 5. 6. Tricuspid valve regurgitation is severe. Post TAVR with 26 mm Sapien 3 mild PVL seen best on SA views at 7:00 mean gradient 6 mmHg lower than TTE 11/21/21 . The aortic valve has been repaired/replaced. Aortic valve regurgitation is not visualized. No aortic stenosis is present. 7. The inferior vena cava is dilated in size with >50% respiratory variability, suggesting right atrial pressure of 8 mmHg.  EKG:  EKG is not ordered today.    Recent Labs: 12/05/2021: NT-Pro BNP 6,343 12/14/2021: Magnesium 2.1 03/01/2022: B Natriuretic Peptide 2,092.5; TSH 1.200 08/13/2022: ALT 21; BUN 55; Creatinine 1.75; Hemoglobin 8.1; Platelet Count 200; Potassium 4.9; Sodium 135  Recent Lipid Panel    Component Value Date/Time   CHOL 81 (L) 08/17/2018 0748   TRIG 82 08/17/2018 0748   HDL 28 (L) 08/17/2018 0748   CHOLHDL 2.9 08/17/2018 0748   LDLCALC 37 08/17/2018 0748    Risk Assessment/Calculations:   CHA2DS2-VASc Score = 5  This indicates a 7.2% annual risk of stroke. The patient's score is based upon: CHF History: 1 HTN History: 1 Diabetes History: 0 Stroke History: 0 Vascular Disease History: 1 Age Score: 2 Gender Score: 0    Home Medications   Current Meds  Medication Sig   acetaminophen (TYLENOL) 650 MG CR tablet Take 650 mg by mouth daily as needed for pain.   aspirin EC 81 MG tablet Take 1 tablet (81 mg total) by mouth daily. Swallow whole.   bicalutamide (CASODEX) 50 MG tablet Take 1 tablet (50 mg total) by mouth daily.  in the afternoon   cetirizine (ZYRTEC) 10 MG tablet Take 10 mg by mouth daily.   cholecalciferol (VITAMIN D3) 25 MCG (1000 UNIT) tablet Take 1,000 Units by mouth 2 (two) times daily with a meal.   dapagliflozin propanediol (FARXIGA) 10 MG TABS tablet Take 1 tablet (10 mg total) by mouth daily.   ferrous  sulfate 325 (65 FE) MG tablet Take 1 tablet (325 mg total) by mouth 2 (two) times daily with a meal.   fluticasone (FLONASE) 50 MCG/ACT nasal spray Place 1 spray into both nostrils daily.   furosemide (LASIX) 40 MG tablet Take 1 tablet (40 mg total) by mouth 2 (two) times daily.   furosemide (LASIX) 80 MG tablet Take 1 tablet (80 mg total) by mouth 2 (two) times daily for 5 days.   gabapentin (NEURONTIN) 100 MG capsule Take 1 capsule (100 mg total) by mouth at bedtime.   glipiZIDE (GLUCOTROL XL) 2.5 MG 24 hr tablet Take 2.5 mg by mouth every morning.   metFORMIN (GLUCOPHAGE) 1000 MG tablet Take 1 tablet (1,000 mg total) by mouth 2 (two) times daily with breakfast and lunch.   OXYGEN Inhale 2 L into the lungs continuous.   pantoprazole (PROTONIX) 40 MG tablet Take 1 tablet (40 mg total) by mouth 2 (two) times daily. Take  1 tab of 40 mg tab  by mouth twice a day till 02/16/22 then 1 tab of 40 mg by mouth daily starting 02/17/22   rosuvastatin (CRESTOR) 5 MG tablet TAKE 1 TABLET BY MOUTH ONCE DAILY AT  6PM   UNABLE TO FIND Take 1 Canister by mouth 2 (two) times daily. Med Name: Magic Cup   vitamin B-12 (CYANOCOBALAMIN) 1000 MCG tablet Take 1 tablet (1,000 mcg total) by mouth 2 (two) times daily with a meal.     Review of Systems      All other systems reviewed and are otherwise negative except as noted above.  Physical Exam    VS:  BP (!) 118/54   Pulse 80   Ht 5' 8.5" (1.74 m)   Wt 146 lb (66.2 kg)   SpO2 97%   BMI 21.88 kg/m  , BMI Body mass index is 21.88 kg/m.  Wt Readings from Last 3 Encounters:  08/19/22 146 lb (66.2 kg)  08/13/22 149 lb 12.8 oz (67.9 kg)  04/11/22 135 lb 9.6 oz (61.5 kg)     GEN: Well nourished, well developed, in no acute distress. HEENT: normal. Neck: Supple, no JVD, carotid bruits, or masses. Cardiac: irregular irregular, no murmurs, rubs, or gallops. No clubbing, cyanosis, 1-2 + pitting edema, venous stasis changes present.  Radials/PT 2+ and equal  bilaterally.  Respiratory:  Respirations regular and unlabored, clear to auscultation bilaterally. GI: Soft, nontender, nondistended. MS: No deformity or atrophy. Skin: Warm and dry, no rash. Neuro:  Strength and sensation are intact. Psych: Normal affect.  Assessment & Plan  Anemia -stopped his blood thinner  -CBC today  Chronic diastolic heart failure -His dry weight is 140, fluid overloaded today on exam with 1-2+ pitting edema -Echocardiogram 4/23 EF 50 to 55%, RV moderately reduced in setting of severe pulmonary hypertension secondary to severe MR -Not a MitraClip candidate -Continue Lasix 40 twice daily, metoprolol XL 12.5 mg daily, and Farxiga 10 mg daily -Recently Lasix was increased to 80 twice daily x5 days, repeat BMP today -might be a candidate for spirolactone or a different loop diuretic  Right ventricular failure/severe mitral regurgitation/severe tricuspid regurgitation -medication optimization is his only option  Pulmonary hypertension -severe right ventricular systolic pressure on recent echo, 70 mmHg -diuretics/heart failure optimization  Chronic atrial fibrillation -rate-controlled -taken off his blood thinner due to anemia -asymptomatic -Not a watchman candidate  Stage IIIb CKD -ordered a BMP today to assess kidney function after increase in lasix        Disposition: Follow up 3 months with [name], MD or APP.  Signed, Elgie Collard, PA-C 08/19/2022, 3:34 PM [facility]

## 2022-08-19 ENCOUNTER — Encounter: Payer: Self-pay | Admitting: Physician Assistant

## 2022-08-19 ENCOUNTER — Ambulatory Visit: Payer: Medicare Other

## 2022-08-19 ENCOUNTER — Ambulatory Visit: Payer: Medicare Other | Attending: Physician Assistant | Admitting: Physician Assistant

## 2022-08-19 VITALS — BP 118/54 | HR 80 | Ht 68.5 in | Wt 146.0 lb

## 2022-08-19 DIAGNOSIS — I272 Pulmonary hypertension, unspecified: Secondary | ICD-10-CM

## 2022-08-19 DIAGNOSIS — I50810 Right heart failure, unspecified: Secondary | ICD-10-CM

## 2022-08-19 DIAGNOSIS — N1832 Chronic kidney disease, stage 3b: Secondary | ICD-10-CM

## 2022-08-19 DIAGNOSIS — I482 Chronic atrial fibrillation, unspecified: Secondary | ICD-10-CM | POA: Diagnosis not present

## 2022-08-19 DIAGNOSIS — I071 Rheumatic tricuspid insufficiency: Secondary | ICD-10-CM | POA: Diagnosis not present

## 2022-08-19 DIAGNOSIS — I10 Essential (primary) hypertension: Secondary | ICD-10-CM

## 2022-08-19 DIAGNOSIS — I5032 Chronic diastolic (congestive) heart failure: Secondary | ICD-10-CM | POA: Diagnosis not present

## 2022-08-19 DIAGNOSIS — I340 Nonrheumatic mitral (valve) insufficiency: Secondary | ICD-10-CM | POA: Diagnosis not present

## 2022-08-19 NOTE — Patient Instructions (Signed)
Medication Instructions:  Your physician recommends that you continue on your current medications as directed. Please refer to the Current Medication list given to you today.  Continue lasix 40 mg twice daily for now *If you need a refill on your cardiac medications before your next appointment, please call your pharmacy*   Lab Work: BMP and CBC today If you have labs (blood work) drawn today and your tests are completely normal, you will receive your results only by: [name] (if you have MyChart) OR A paper copy in the mail If you have any lab test that is abnormal or we need to change your treatment, we will call you to review the results.   Follow-Up: At [facility], you and your health needs are our priority.  As part of our continuing mission to provide you with exceptional heart care, we have created designated Provider Care Teams.  These Care Teams include your primary Cardiologist (physician) and Advanced Practice Providers (APPs -  Physician Assistants and Nurse Practitioners) who all work together to provide you with the care you need, when you need it.  We recommend signing up for the patient portal called "MyChart".  Sign up information is provided on this After Visit Summary.  MyChart is used to connect with patients for Virtual Visits (Telemedicine).  Patients are able to view lab/test results, encounter notes, upcoming appointments, etc.  Non-urgent messages can be sent to your provider as well.   To learn more about what you can do with MyChart, go to [email or web address].    Your next appointment:   3 month(s)  The format for your next appointment:   In Person  Provider:   [name], MD    Other Instructions 1.Weigh every morning after using the restroom but before breakfast, keep a log and let us know if you have a weight gain of 2 or more pounds overnight or 5 pounds in one week 2.Wear compression hose 10-15 mmhg, remove them at  night  Low-Sodium Eating Plan Sodium, which is an element that makes up salt, helps you maintain a healthy balance of fluids in your body. Too much sodium can increase your blood pressure and cause fluid and waste to be held in your body. Your health care provider or dietitian may recommend following this plan if you have high blood pressure (hypertension), kidney disease, liver disease, or heart failure. Eating less sodium can help lower your blood pressure, reduce swelling, and protect your heart, liver, and kidneys. What are tips for following this plan? Reading food labels The Nutrition Facts label lists the amount of sodium in one serving of the food. If you eat more than one serving, you must multiply the listed amount of sodium by the number of servings. Choose foods with less than 140 mg of sodium per serving. Avoid foods with 300 mg of sodium or more per serving. Shopping  Look for lower-sodium products, often labeled as "low-sodium" or "no salt added." Always check the sodium content, even if foods are labeled as "unsalted" or "no salt added." Buy fresh foods. Avoid canned foods and pre-made or frozen meals. Avoid canned, cured, or processed meats. Buy breads that have less than 80 mg of sodium per slice. Cooking  Eat more home-cooked food and less restaurant, buffet, and fast food. Avoid adding salt when cooking. Use salt-free seasonings or herbs instead of table salt or sea salt. Check with your health care provider or pharmacist before using salt substitutes. Cook with plant-based  oils, such as canola, sunflower, or olive oil. Meal planning When eating at a restaurant, ask that your food be prepared with less salt or no salt, if possible. Avoid dishes labeled as brined, pickled, cured, smoked, or made with soy sauce, miso, or teriyaki sauce. Avoid foods that contain MSG (monosodium glutamate). MSG is sometimes added to [place] food, bouillon, and some canned foods. Make meals  that can be grilled, baked, poached, roasted, or steamed. These are generally made with less sodium. General information Most people on this plan should limit their sodium intake to 1,500-2,000 mg (milligrams) of sodium each day. What foods should I eat? Fruits Fresh, frozen, or canned fruit. Fruit juice. Vegetables Fresh or frozen vegetables. "No salt added" canned vegetables. "No salt added" tomato sauce and paste. Low-sodium or reduced-sodium tomato and vegetable juice. Grains Low-sodium cereals, including oats, puffed wheat and rice, and shredded wheat. Low-sodium crackers. Unsalted rice. Unsalted pasta. Low-sodium bread. Whole-grain breads and whole-grain pasta. Meats and other proteins Fresh or frozen (no salt added) meat, poultry, seafood, and fish. Low-sodium canned tuna and salmon. Unsalted nuts. Dried peas, beans, and lentils without added salt. Unsalted canned beans. Eggs. Unsalted nut butters. Dairy Milk. Soy milk. Cheese that is naturally low in sodium, such as ricotta cheese, fresh mozzarella, or Swiss cheese. Low-sodium or reduced-sodium cheese. Cream cheese. Yogurt. Seasonings and condiments Fresh and dried herbs and spices. Salt-free seasonings. Low-sodium mustard and ketchup. Sodium-free salad dressing. Sodium-free light mayonnaise. Fresh or refrigerated horseradish. Lemon juice. Vinegar. Other foods Homemade, reduced-sodium, or low-sodium soups. Unsalted popcorn and pretzels. Low-salt or salt-free chips. The items listed above may not be a complete list of foods and beverages you can eat. Contact a dietitian for more information. What foods should I avoid? Vegetables Sauerkraut, pickled vegetables, and relishes. Olives. [place] fries. Onion rings. Regular canned vegetables (not low-sodium or reduced-sodium). Regular canned tomato sauce and paste (not low-sodium or reduced-sodium). Regular tomato and vegetable juice (not low-sodium or reduced-sodium). Frozen vegetables in  sauces. Grains Instant hot cereals. Bread stuffing, pancake, and biscuit mixes. Croutons. Seasoned rice or pasta mixes. Noodle soup cups. Boxed or frozen macaroni and cheese. Regular salted crackers. Self-rising flour. Meats and other proteins Meat or fish that is salted, canned, smoked, spiced, or pickled. Precooked or cured meat, such as sausages or meat loaves. [place]ce Salines. Ham. Pepperoni. Hot dogs. Corned beef. Chipped beef. Salt pork. Jerky. Pickled herring. Anchovies and sardines. Regular canned tuna. Salted nuts. Dairy Processed cheese and cheese spreads. Hard cheeses. Cheese curds. Blue cheese. Feta cheese. String cheese. Regular cottage cheese. Buttermilk. Canned milk. Fats and oils Salted butter. Regular margarine. Ghee. Bacon fat. Seasonings and condiments Onion salt, garlic salt, seasoned salt, table salt, and sea salt. Canned and packaged gravies. Worcestershire sauce. Tartar sauce. Barbecue sauce. Teriyaki sauce. Soy sauce, including reduced-sodium. Steak sauce. Fish sauce. Oyster sauce. Cocktail sauce. Horseradish that you find on the shelf. Regular ketchup and mustard. Meat flavorings and tenderizers. Bouillon cubes. Hot sauce. Pre-made or packaged marinades. Pre-made or packaged taco seasonings. Relishes. Regular salad dressings. Salsa. Other foods Salted popcorn and pretzels. Corn chips and puffs. Potato and tortilla chips. Canned or dried soups. Pizza. Frozen entrees and pot pies. The items listed above may not be a complete list of foods and beverages you should avoid. Contact a dietitian for more information. Summary Eating less sodium can help lower your blood pressure, reduce swelling, and protect your heart, liver, and kidneys. Most people on this plan should limit their sodium intake to  1,500-2,000 mg (milligrams) of sodium each day. Canned, boxed, and frozen foods are high in sodium. Restaurant foods, fast foods, and pizza are also very high in sodium. You also get sodium by  adding salt to food. Try to cook at home, eat more fresh fruits and vegetables, and eat less fast food and canned, processed, or prepared foods. This information is not intended to replace advice given to you by your health care provider. Make sure you discuss any questions you have with your health care provider. Document Revised: 11/26/2019 Document Reviewed: 09/22/2019 Elsevier Patient Education  [place]

## 2022-08-20 LAB — CBC
Hematocrit: 29.3 % — ABNORMAL LOW (ref 37.5–51.0)
Hemoglobin: 9.6 g/dL — ABNORMAL LOW (ref 13.0–17.7)
MCH: 30.9 pg (ref 26.6–33.0)
MCHC: 32.8 g/dL (ref 31.5–35.7)
MCV: 94 fL (ref 79–97)
Platelets: 180 10*3/uL (ref 150–450)
RBC: 3.11 x10E6/uL — ABNORMAL LOW (ref 4.14–5.80)
RDW: 14.6 % (ref 11.6–15.4)
WBC: 6.3 10*3/uL (ref 3.4–10.8)

## 2022-08-20 LAB — BASIC METABOLIC PANEL
BUN/Creatinine Ratio: 25 — ABNORMAL HIGH (ref 10–24)
BUN: 40 mg/dL — ABNORMAL HIGH (ref 8–27)
CO2: 28 mmol/L (ref 20–29)
Calcium: 8.9 mg/dL (ref 8.6–10.2)
Chloride: 99 mmol/L (ref 96–106)
Creatinine, Ser: 1.62 mg/dL — ABNORMAL HIGH (ref 0.76–1.27)
Glucose: 165 mg/dL — ABNORMAL HIGH (ref 70–99)
Potassium: 4.6 mmol/L (ref 3.5–5.2)
Sodium: 143 mmol/L (ref 134–144)
eGFR: 41 mL/min/{1.73_m2} — ABNORMAL LOW (ref >59)

## 2022-08-23 ENCOUNTER — Telehealth: Payer: Self-pay

## 2022-08-23 NOTE — Telephone Encounter (Signed)
This nurse spoke with patients daughter.  Made her aware that his Vitamin B-12 levels are high.  Advised that he needs to stop taking B-!2 and if he is taking a multivitamin to stop taking that as well.  Daughter asked this nurse if patient should stop taking vitamin B complex as well.  This nurse advised yes he should stop that as well.  She acknowledged understanding.  No further questions or concerns noted at this time.

## 2022-08-28 ENCOUNTER — Ambulatory Visit (INDEPENDENT_AMBULATORY_CARE_PROVIDER_SITE_OTHER): Payer: Medicare Other

## 2022-08-28 VITALS — BP 143/65 | HR 68 | Temp 98.0°F | Resp 18 | Ht 68.0 in | Wt 147.0 lb

## 2022-08-28 DIAGNOSIS — D500 Iron deficiency anemia secondary to blood loss (chronic): Secondary | ICD-10-CM | POA: Diagnosis not present

## 2022-08-28 MED ORDER — ACETAMINOPHEN 325 MG PO TABS: 650.0000 mg | ORAL_TABLET | Freq: Once | ORAL | Status: DC

## 2022-08-28 MED ORDER — SODIUM CHLORIDE 0.9 % IV SOLN
300.0000 mg | Freq: Once | INTRAVENOUS | Status: AC
  Administered 2022-08-28: 300 mg via INTRAVENOUS
  Filled 2022-08-28: qty 15

## 2022-08-28 MED ORDER — DIPHENHYDRAMINE HCL 25 MG PO CAPS
25.0000 mg | ORAL_CAPSULE | Freq: Once | ORAL | Status: AC
  Administered 2022-08-28: 25 mg via ORAL
  Filled 2022-08-28: qty 1

## 2022-08-28 NOTE — Patient Instructions (Signed)

## 2022-08-28 NOTE — Progress Notes (Signed)
Diagnosis: Iron Deficiency Anemia  Provider:  Marshell Garfinkel MD  Procedure: Infusion  IV Type: Peripheral, IV Location: L Forearm  Venofer (Iron Sucrose), Dose: 300 mg  Infusion Start Time: 7471  Infusion Stop Time: 1100  Post Infusion IV Care: Observation period completed and Patient declined observation  Discharge: Condition: Good, Destination: Home . AVS provided to patient.   Performed by:  Cleophus Molt, RN

## 2022-09-01 ENCOUNTER — Other Ambulatory Visit: Payer: Self-pay | Admitting: Internal Medicine

## 2022-09-01 DIAGNOSIS — E782 Mixed hyperlipidemia: Secondary | ICD-10-CM

## 2022-09-04 ENCOUNTER — Ambulatory Visit (INDEPENDENT_AMBULATORY_CARE_PROVIDER_SITE_OTHER): Payer: Medicare Other

## 2022-09-04 VITALS — BP 136/74 | HR 83 | Temp 97.8°F | Resp 18 | Ht 68.0 in | Wt 146.0 lb

## 2022-09-04 DIAGNOSIS — D500 Iron deficiency anemia secondary to blood loss (chronic): Secondary | ICD-10-CM | POA: Diagnosis not present

## 2022-09-04 MED ORDER — ACETAMINOPHEN 325 MG PO TABS: 650.0000 mg | ORAL_TABLET | Freq: Once | ORAL | Status: DC

## 2022-09-04 MED ORDER — SODIUM CHLORIDE 0.9 % IV SOLN
300.0000 mg | Freq: Once | INTRAVENOUS | Status: AC
  Administered 2022-09-04: 300 mg via INTRAVENOUS
  Filled 2022-09-04: qty 15

## 2022-09-04 MED ORDER — DIPHENHYDRAMINE HCL 25 MG PO CAPS
25.0000 mg | ORAL_CAPSULE | Freq: Once | ORAL | Status: AC
  Administered 2022-09-04: 25 mg via ORAL
  Filled 2022-09-04: qty 1

## 2022-09-04 NOTE — Progress Notes (Signed)
Diagnosis: Iron Deficiency Anemia  Provider:  Marshell Garfinkel MD  Procedure: Infusion  IV Type: Peripheral, IV Location: L Hand  Venofer (Iron Sucrose), Dose: 300 mg  Infusion Start Time: 7116  Infusion Stop Time: 5790  Post Infusion IV Care: Peripheral IV Discontinued  Discharge: Condition: Good, Destination: Home . AVS provided to patient.   Performed by:  Koren Shiver, RN

## 2022-09-11 ENCOUNTER — Ambulatory Visit (INDEPENDENT_AMBULATORY_CARE_PROVIDER_SITE_OTHER): Payer: Medicare Other

## 2022-09-11 VITALS — BP 131/67 | HR 66 | Temp 98.2°F | Resp 18 | Ht 68.0 in | Wt 149.0 lb

## 2022-09-11 DIAGNOSIS — D500 Iron deficiency anemia secondary to blood loss (chronic): Secondary | ICD-10-CM | POA: Diagnosis not present

## 2022-09-11 MED ORDER — ACETAMINOPHEN 325 MG PO TABS: 650.0000 mg | ORAL_TABLET | Freq: Once | ORAL | Status: DC

## 2022-09-11 MED ORDER — SODIUM CHLORIDE 0.9 % IV SOLN
300.0000 mg | Freq: Once | INTRAVENOUS | Status: AC
  Administered 2022-09-11: 300 mg via INTRAVENOUS
  Filled 2022-09-11: qty 15

## 2022-09-11 MED ORDER — DIPHENHYDRAMINE HCL 25 MG PO CAPS
25.0000 mg | ORAL_CAPSULE | Freq: Once | ORAL | Status: AC
  Administered 2022-09-11: 25 mg via ORAL
  Filled 2022-09-11: qty 1

## 2022-09-11 NOTE — Progress Notes (Signed)
Diagnosis: Iron Deficiency Anemia  Provider:  Marshell Garfinkel MD  Procedure: Infusion  IV Type: Peripheral, IV Location: L Forearm  Venofer (Iron Sucrose), Dose: 300 mg  Infusion Start Time: 3353  Infusion Stop Time: 3174  Post Infusion IV Care: Peripheral IV Discontinued  Discharge: Condition: Good, Destination: Home . AVS provided to patient.   Performed by:  Arnoldo Morale, RN

## 2022-09-13 ENCOUNTER — Ambulatory Visit
Admission: RE | Admit: 2022-09-13 | Discharge: 2022-09-13 | Disposition: A | Discharge status: Home / Self Care | Payer: Medicare Other | Source: Ambulatory Visit | Attending: Gastroenterology | Admitting: Gastroenterology

## 2022-09-13 DIAGNOSIS — R1030 Lower abdominal pain, unspecified: Secondary | ICD-10-CM

## 2022-09-13 DIAGNOSIS — D649 Anemia, unspecified: Secondary | ICD-10-CM

## 2022-09-13 DIAGNOSIS — K8020 Calculus of gallbladder without cholecystitis without obstruction: Secondary | ICD-10-CM | POA: Diagnosis not present

## 2022-09-13 DIAGNOSIS — K7689 Other specified diseases of liver: Secondary | ICD-10-CM | POA: Diagnosis not present

## 2022-09-17 ENCOUNTER — Other Ambulatory Visit: Payer: Self-pay | Admitting: Gastroenterology

## 2022-09-17 DIAGNOSIS — R9389 Abnormal findings on diagnostic imaging of other specified body structures: Secondary | ICD-10-CM

## 2022-09-19 NOTE — Progress Notes (Signed)
Follow-up Visit   Date: 09/19/22   [name] MRN: [id number] DOB: 1932/11/20   Interim History: [name] is a 88 y.o. right-handed Caucasian male with atrial fibrillation not on anticoagulation due to falls, aortic stenosis s/p TAVR, HF, PAD, diabetes mellitus, hypertension, hyperlipidemia, GERD, CKD stage 3b, history of prostate cancer, and OA returning to the clinic for follow-up of neuropathy.  The patient was accompanied to the clinic by daughter.  History of present illness: For several years, he has numbness/tingling of the feet and legs, up to the knees.  Symptoms are constant and worse with prolonged standing and walking. He has difficulty walking to his mailbox which is 100 feet away. He has Walks with a cane for the past two years.  He has fallen four times this year.  Fortunately, no severe injuries.  He is on anticoulaguation therapy. He has known lumbar canal stenosis at L2-3 and L4-5 and seen Dr. Louanne Skye for lumbar canal stenosis. He was offered surgery and declined.  He is the primary caregiver to his wife who is predominately wheelchair-bound.   He previously worked as a Engineer, structural, drove an IT sales professional, Social research officer, government, and retired from [employer], [employer]/video.    UPDATE 09/21/2021:   He has a fall a few weeks ago and bruised his right arm. Since this time, he has been compliant with using a walker.  He attributes his fall to imbalance and leg weakness. He did not go to the ER. He reported marked benefit in pain since starting gabapentin '100mg'$  at bedtime and is requesting refills.  His wife is hospitalized following a fall and has fractured her feet, so will need rehab stay. He is trying to coordinate her care and prefer to hold on doing any therapy until later. No problems with driving.  UPDATE 09/23/2022:  He is here for 1 year visit.  Over the past year, he has been hospitalized for failure to thrive and received several iron infusions due to iron  deficient anemia.  He was in acute rehab facility until May and then completed home PT/OT.  He has become more sedentary and walks with a walker/wheelchair at all times.  He has not had any falls.  His burning/tingling is well-controlled on gabapentin '100mg'$  at bedtime.  He denies pain.  He currently lives at home and daughter is primary caregiver.  He is able to perform ADLs without assistance.   Medications:  Current Outpatient Medications on File Prior to Visit  Medication Sig Dispense Refill   acetaminophen (TYLENOL) 650 MG CR tablet Take 650 mg by mouth daily as needed for pain.     aspirin EC 81 MG tablet Take 1 tablet (81 mg total) by mouth daily. Swallow whole. 30 tablet 0   bicalutamide (CASODEX) 50 MG tablet Take 1 tablet (50 mg total) by mouth daily.  in the afternoon 30 tablet 0   cetirizine (ZYRTEC) 10 MG tablet Take 10 mg by mouth daily.     cholecalciferol (VITAMIN D3) 25 MCG (1000 UNIT) tablet Take 1,000 Units by mouth 2 (two) times daily with a meal.     dapagliflozin propanediol (FARXIGA) 10 MG TABS tablet Take 1 tablet (10 mg total) by mouth daily. 30 tablet 0   ferrous sulfate 325 (65 FE) MG tablet Take 1 tablet (325 mg total) by mouth 2 (two) times daily with a meal. 60 tablet 0   fluticasone (FLONASE) 50 MCG/ACT nasal spray Place 1 spray into both nostrils daily. 11.1 mL  0   furosemide (LASIX) 40 MG tablet Take 1 tablet (40 mg total) by mouth 2 (two) times daily. 60 tablet 0   furosemide (LASIX) 80 MG tablet Take 1 tablet (80 mg total) by mouth 2 (two) times daily for 5 days. 10 tablet 0   gabapentin (NEURONTIN) 100 MG capsule Take 1 capsule (100 mg total) by mouth at bedtime. 30 capsule 0   glipiZIDE (GLUCOTROL XL) 2.5 MG 24 hr tablet Take 2.5 mg by mouth every morning.     metFORMIN (GLUCOPHAGE) 1000 MG tablet Take 1 tablet (1,000 mg total) by mouth 2 (two) times daily with breakfast and lunch. 60 tablet 0   metoprolol succinate (TOPROL-XL) 25 MG 24 hr tablet Take 0.5 tablets  (12.5 mg total) by mouth daily. 15 tablet 0   OXYGEN Inhale 2 L into the lungs continuous.     pantoprazole (PROTONIX) 40 MG tablet Take 1 tablet (40 mg total) by mouth 2 (two) times daily. Take  1 tab of 40 mg tab  by mouth twice a day till 02/16/22 then 1 tab of 40 mg by mouth daily starting 02/17/22 60 tablet 0   rosuvastatin (CRESTOR) 5 MG tablet TAKE 1 TABLET BY MOUTH ONCE DAILY AT  6PM 90 tablet 3   UNABLE TO FIND Take 1 Canister by mouth 2 (two) times daily. Med Name: Magic Cup     vitamin B-12 (CYANOCOBALAMIN) 1000 MCG tablet Take 1 tablet (1,000 mcg total) by mouth 2 (two) times daily with a meal. 60 tablet 0   No current facility-administered medications on file prior to visit.    Allergies:  Allergies  Allergen Reactions   Amoxicillin Other (See Comments)    Burning of mouth, tongue and lips.  itchy, burning throat    Vital Signs:  There were no vitals taken for this visit.    Neurological Exam: MENTAL STATUS including orientation to time, place, person, recent and remote memory, attention span and concentration, language, and fund of knowledge is normal.  Speech is not dysarthric.  CRANIAL NERVES:   Pupils equal round and reactive to light.  Normal conjugate, extra-ocular eye movements in all directions of gaze.  No ptosis   MOTOR:  Motor strength is 5-/5 throughout. No pronator drift.  Tone is normal.  No tremor.   MSRs:  Reflexes are 2+/4 throughout, except absent at the ankles.  SENSORY:  Intact to vibration at MCP and knees, absent below the ankles.Marland Kitchen  COORDINATION/GAIT:  Gait not tested, patient in wheelchair.   Data: MRI lumbar spine wo contrast 05/22/2019: 12 mm marrow space focus in the superior L1 vertebral body. In a patient with a history of prostate cancer, this could represent a metastatic deposit. However, this is an isolated finding which would be somewhat unusual.   L1-2: 3 mm retrolisthesis. Mild bulging of the disc. Mild stenosis of the lateral  recesses and foramina but without definite neural compression.   L2-3: Multifactorial spinal stenosis that could cause neural compression on either or both sides. 3 mm retrolisthesis. Bulging of the disc. Facet and ligamentous hypertrophy. Discogenic edema on the right that could be associated with back pain.   L3-4: Bilateral lateral recess and foraminal narrowing that could cause neural compression on either side. Foraminal narrowing more marked on the right. Endplate osteophytes and bulging of the disc in combination with facet hypertrophy.   L4-5: Multifactorial spinal stenosis that could cause neural compression on either or both sides. Bilateral facet arthropathy with 3 mm of anterolisthesis.  Mild bulging of the disc.   L5-S1: Endplate osteophytes and bulging of the disc. Facet osteoarthritis. Mild bilateral foraminal narrowing but without definite neural compression.  IMPRESSION/PLAN: Peripheral neuropathy contributed by diabetes, manifesting with distal painful paresthesias, weakness, and ataxia.  Stable  - Pain is well-controlled on gabapentin '100mg'$  which was refilled  - Patient educated on daily foot inspection, fall prevention, and safety precautions around the home.  2.  Multifactorial lumbosacral canal stenosis at L2-3 and L4-5 with neurogenic claudication. Previously seen Dr. Louanne Skye and declined surgery, unlikely a surgical candidate at this point given multiple comorbidities.  PT completed  - Continue to use walker  Return to clinic in 1 year or sooner as needed.    Thank you for allowing me to participate in patient's care.  If I can answer any additional questions, I would be pleased to do so.    Sincerely,    Yevonne Yokum K. Posey Pronto, DO

## 2022-09-21 ENCOUNTER — Telehealth: Payer: Self-pay | Admitting: Internal Medicine

## 2022-09-21 NOTE — Telephone Encounter (Signed)
Called patient regarding December appointments, spoke with patient's daughter. Patient will be notified.

## 2022-09-23 ENCOUNTER — Ambulatory Visit (INDEPENDENT_AMBULATORY_CARE_PROVIDER_SITE_OTHER): Payer: Medicare Other | Admitting: Neurology

## 2022-09-23 ENCOUNTER — Encounter: Payer: Self-pay | Admitting: Neurology

## 2022-09-23 VITALS — BP 139/92 | HR 71 | Ht 68.0 in | Wt 150.0 lb

## 2022-09-23 DIAGNOSIS — E1142 Type 2 diabetes mellitus with diabetic polyneuropathy: Secondary | ICD-10-CM | POA: Diagnosis not present

## 2022-09-23 DIAGNOSIS — E1149 Type 2 diabetes mellitus with other diabetic neurological complication: Secondary | ICD-10-CM

## 2022-09-23 MED ORDER — GABAPENTIN 100 MG PO CAPS: 100.0000 mg | ORAL_CAPSULE | Freq: Every day | ORAL | 3 refills | Status: AC

## 2022-09-23 NOTE — Patient Instructions (Signed)
Continue gabapentin '100mg'$  at bedtime  Return to clinic in 1 year

## 2022-10-03 NOTE — Progress Notes (Signed)
Cardiology Office Note:    Date:  10/04/2022   ID:  [name], DOB [birth year], MRN [id number]  PCP:  Sissy Hoff, [place] Providers Cardiologist:  [name], MD     Referring MD: [name], MD   CC: F/U LE edema  History of Present Illness:    COTTON BECKLEY is a 88 y.o. male with a hx of coronary Artery Calcification, severe aortic stenosis s/p transapical TAVR [name] 28, Dr. Burt Knack, 2014), HFpEF, Moderate Mitral Regurgitation, Moderate Tricuspid Regurgitation, DM with HTN, persistent atrial fibrillation seen by Dr. Lovena Le, PAD seen by Dr. [name] who presented for evaluation 09/11/20.   2021: Found to have elevated BNP and was started on Farxiga and lasix 40 mg PO Daily- developed AKI.  Has had lasix does reduced and farixga held with improvement of kidney function but with leg swelling.  12/20 returned his diuretics.   2022 his legs were improved, but by seing EP in the fall, his LE was worse, norvasx was switched to verapamil.  2023: Worsening MR and TR; saw structural; he is not a candidate for procedural intervention.  Has had several spells of worsening LE edema and kidney dysfunction. Torsemide was tried with worsening AKI  Patient notes that he is doing ok.   No SOB.  No chest pain or pressure .  No SOB/DOE and no PND/Orthopnea.  His leg swelling is stable but he has new LE wounds.  Daughter reached out to Dr. Orland Mustard but iw waiting to heart back.   Past Medical History:  Diagnosis Date   Anemia    LOW PLATELETS OTHER DAY  PER PT   Anticoagulated on Coumadin 12/21/2012   Aortic stenosis    Arthritis    "left wrist; back sometimes" (01/21/2013)   Atrial fibrillation ([place])    [name], DR COOPER   Bradycardia    Exertional shortness of breath    Heart murmur    "I've had it for years; runs in the family on daddy's side" (01/21/2013)   HTN (hypertension)    Hyperlipemia 12/21/2012   Prostate cancer ([place])    "had 40 tx of  radiation in 2009" (01/21/2013)   S/P aortic valve replacement with bioprosthetic valve 01/19/2013   Transcatheter Aortic Valve Replacement using 33m Sapien bioprosthetic tissue valve via transapical approach   Type II diabetes mellitus ([place]     Past Surgical History:  Procedure Laterality Date   BALLOON DILATION N/A 08/02/2020   Procedure: BALLOON DILATION;  Surgeon: [name] MD;  Location: [facility];  Service: Gastroenterology;  Laterality: N/A;   BIOPSY  10/03/2021   Procedure: BIOPSY;  Surgeon: [name] MD;  [facility]: [facility];  Service: Endoscopy;;   BIOPSY  12/14/2021   Procedure: BIOPSY;  Surgeon: [name] MD;  Location: [facility]  Service: Endoscopy;;   CARDIAC CATHETERIZATION  12/16/2012   CARDIAC VALVE REPLACEMENT  01/19/2013   AVR   CATARACT EXTRACTION W/ INTRAOCULAR LENS  IMPLANT, BILATERAL     ESOPHAGOGASTRODUODENOSCOPY N/A 10/03/2021   Procedure: ESOPHAGOGASTRODUODENOSCOPY (EGD);  Surgeon: [name] MD;  [facility];  Service: Endoscopy;  Laterality: N/A;   ESOPHAGOGASTRODUODENOSCOPY N/A 12/14/2021   Procedure: ESOPHAGOGASTRODUODENOSCOPY (EGD);  Surgeon: [name] MD;  Location: [facility]  Service: Endoscopy;  Laterality: N/A;   ESOPHAGOGASTRODUODENOSCOPY (EGD) WITH PROPOFOL N/A 08/02/2020   Procedure: ESOPHAGOGASTRODUODENOSCOPY (EGD) WITH PROPOFOL;  Surgeon: [name] MD;  [facility];  Service: Gastroenterology;  Laterality: N/A;   EYE SURGERY  INTRAOPERATIVE TRANSESOPHAGEAL ECHOCARDIOGRAM N/A 01/19/2013   Procedure: INTRAOPERATIVE TRANSESOPHAGEAL ECHOCARDIOGRAM;  Surgeon: [name], MD;  Location: [place];  Service: Open Heart Surgery;  Laterality: N/A;   ORIF FOREARM FRACTURE Left 1954   "compound fx" (01/21/2013)   TRANSTHORACIC ECHOCARDIOGRAM  09/04/10, 09/07/08    Current Medications: Current Meds  Medication Sig   acetaminophen (TYLENOL) 650 MG CR tablet Take 650 mg by mouth daily before  breakfast.   Ascorbic Acid (VITAMIN C) 1000 MG tablet Take 1,000 mg by mouth daily.   aspirin EC 81 MG tablet Take 1 tablet (81 mg total) by mouth daily. Swallow whole.   cetirizine (ZYRTEC) 10 MG tablet Take 10 mg by mouth daily.   cholecalciferol (VITAMIN D3) 25 MCG (1000 UNIT) tablet Take 1,000 Units by mouth 2 (two) times daily with a meal.   dapagliflozin propanediol (FARXIGA) 10 MG TABS tablet Take 1 tablet (10 mg total) by mouth daily.   ferrous sulfate 325 (65 FE) MG tablet Take 1 tablet (325 mg total) by mouth 2 (two) times daily with a meal.   furosemide (LASIX) 40 MG tablet Take 1 tablet (40 mg total) by mouth 2 (two) times daily. (Patient taking differently: Take 40 mg by mouth 2 (two) times daily. 60 mg twice daily)   gabapentin (NEURONTIN) 100 MG capsule Take 1 capsule (100 mg total) by mouth at bedtime.   glipiZIDE (GLUCOTROL XL) 2.5 MG 24 hr tablet Take 2.5 mg by mouth every morning.   KLOR-CON M20 20 MEQ tablet Take 40 mEq by mouth 3 (three) times a week. Mon, wed, fri   metFORMIN (GLUCOPHAGE) 1000 MG tablet Take 1 tablet (1,000 mg total) by mouth 2 (two) times daily with breakfast and lunch.   OXYGEN Inhale 2 L into the lungs as needed (shortness of breath).   pantoprazole (PROTONIX) 40 MG tablet Take 1 tablet (40 mg total) by mouth 2 (two) times daily. Take  1 tab of 40 mg tab  by mouth twice a day till 02/16/22 then 1 tab of 40 mg by mouth daily starting 02/17/22   rosuvastatin (CRESTOR) 5 MG tablet TAKE 1 TABLET BY MOUTH ONCE DAILY AT  6PM   Vitamin A 3 MG (10000 UT) TABS Take 1,000 mg by mouth daily at 6 (six) AM.   XTANDI 40 MG capsule Take 160 mg by mouth daily.   [DISCONTINUED] Beta Carotene (VITAMIN A) 25000 UNIT capsule Take 25,000 Units by mouth daily.   [DISCONTINUED] vitamin B-12 (CYANOCOBALAMIN) 1000 MCG tablet Take 1 tablet (1,000 mcg total) by mouth 2 (two) times daily with a meal.     Allergies:   Amoxicillin   Social History   Socioeconomic History   Marital  status: Married    Spouse name: [name]   Number of children: 2   Years of education: Not on file   Highest education level: High school graduate  Occupational History   Occupation: Retired  Tobacco Use   Smoking status: Former    Packs/day: 2.00    Years: 22.00    Total pack years: 44.00    Types: Cigarettes    Quit date: 09/11/1976    Years since quitting: 46.0   Smokeless tobacco: Never  Vaping Use   Vaping Use: Never used  Substance and Sexual Activity   Alcohol use: Not on file   Drug use: No   Sexual activity: Never  Other Topics Concern   Not on file  Social History Narrative   Are you right handed  or left handed? Right Handed    Are you currently employed ?    What is your current occupation?   Do you live at home alone?   Who lives with you?    What type of home do you live in: 1 story or 2 story? Lives in a one story home        Social Determinants of Health   Financial Resource Strain: Not on file  Food Insecurity: No Food Insecurity (03/04/2022)   Hunger Vital Sign    Worried About Running Out of Food in the Last Year: Never true    Ran Out of Food in the Last Year: Never true  Transportation Needs: No Transportation Needs (03/04/2022)   PRAPARE - Hydrologist (Medical): No    Lack of Transportation (Non-Medical): No  Physical Activity: Not on file  Stress: Not on file  Social Connections: Not on file    Social: Daughter comes to visits now (2023  Family History: The patient's family history includes Bone cancer (age of onset: 70) in his mother; Cancer (age of onset: 53) in his brother; Diabetes in his mother; Heart disease in his father; Melanoma (age of onset: 75) in his mother; Pancreatic cancer (age of onset: 24) in his father; Stroke in his sister.  ROS:   Please see the history of present illness.     All other systems reviewed and are negative.  EKGs/Labs/Other Studies Reviewed:    The following studies were reviewed  today:  Cardiac Studies & Procedures       ECHOCARDIOGRAM  ECHOCARDIOGRAM COMPLETE 03/02/2022  Narrative ECHOCARDIOGRAM REPORT    Patient Name:   JARL SELLITTO Date of Exam: 03/02/2022 Medical Rec #:  [id number]         Height:       68.0 in Accession #:    [id number]        Weight:       138.4 lb Date of Birth:  20-Feb-1933          BSA:          1.748 m Patient Age:    71 years          BP:           87/61 mmHg Patient Gender: M                 HR:           96 bpm. Exam Location:  Inpatient  Procedure: 2D Echo, Cardiac Doppler and Color Doppler  Indications:    Dyspnea  History:        Patient has prior history of Echocardiogram examinations, most recent 11/21/2016. Pulmonary HTN, Aortic Valve Disease and Mitral Valve Disease; Risk Factors:Hypertension.  Sonographer:    Merrie Roof RDCS Referring Phys: Stephen   1. Left ventricular ejection fraction, by estimation, is 55 to 60%. The left ventricle has normal function. The left ventricle has no regional wall motion abnormalities. Left ventricular diastolic parameters are indeterminate. 2. Right ventricular systolic function is moderately reduced. The right ventricular size is mildly enlarged. There is severely elevated pulmonary artery systolic pressure. 3. Left atrial size was severely dilated. 4. Right atrial size was severely dilated. 5. ERO > .4 similar to prior echo and PISA radius 1 MAC with leaflet thickening and restricted posterior leaflet . The mitral valve is normal in structure. Severe mitral valve regurgitation. No evidence of mitral stenosis. 6.  Tricuspid valve regurgitation is severe. 7. Post TAVR with 26 mm Sapien 3 mild PVL seen best on SA views at 7:00 mean gradient 6 mmHg lower than TTE 11/21/21 . The aortic valve has been repaired/replaced. Aortic valve regurgitation is not visualized. No aortic stenosis is present. 8. The inferior vena cava is dilated in size with >50%  respiratory variability, suggesting right atrial pressure of 8 mmHg.  FINDINGS Left Ventricle: Left ventricular ejection fraction, by estimation, is 55 to 60%. The left ventricle has normal function. The left ventricle has no regional wall motion abnormalities. The left ventricular internal cavity size was normal in size. There is no left ventricular hypertrophy. Left ventricular diastolic parameters are indeterminate.  Right Ventricle: The right ventricular size is mildly enlarged. No increase in right ventricular wall thickness. Right ventricular systolic function is moderately reduced. There is severely elevated pulmonary artery systolic pressure. The tricuspid regurgitant velocity is 3.69 m/s, and with an assumed right atrial pressure of 15 mmHg, the estimated right ventricular systolic pressure is 67.1 mmHg.  Left Atrium: Left atrial size was severely dilated.  Right Atrium: Right atrial size was severely dilated.  Pericardium: There is no evidence of pericardial effusion.  Mitral Valve: ERO > .4 similar to prior echo and PISA radius 1 MAC with leaflet thickening and restricted posterior leaflet. The mitral valve is normal in structure. Severe mitral valve regurgitation. No evidence of mitral valve stenosis.  Tricuspid Valve: The tricuspid valve is normal in structure. Tricuspid valve regurgitation is severe. No evidence of tricuspid stenosis.  Aortic Valve: Post TAVR with 26 mm Sapien 3 mild PVL seen best on SA views at 7:00 mean gradient 6 mmHg lower than TTE 11/21/21. The aortic valve has been repaired/replaced. Aortic valve regurgitation is not visualized. No aortic stenosis is present. Aortic valve mean gradient measures 6.0 mmHg. Aortic valve peak gradient measures 13.0 mmHg. Aortic valve area, by VTI measures 1.58 cm.  Pulmonic Valve: The pulmonic valve was normal in structure. Pulmonic valve regurgitation is mild. No evidence of pulmonic stenosis.  Aorta: The aortic root is  normal in size and structure.  Venous: The inferior vena cava is dilated in size with greater than 50% respiratory variability, suggesting right atrial pressure of 8 mmHg.  IAS/Shunts: No atrial level shunt detected by color flow Doppler.   LEFT VENTRICLE PLAX 2D LVIDd:         4.10 cm   Diastology LVIDs:         3.15 cm   LV e' medial:  5.03 cm/s LV PW:         1.15 cm   LV e' lateral: 10.40 cm/s LV IVS:        1.10 cm LVOT diam:     2.00 cm LV SV:         47 LV SV Index:   27 LVOT Area:     3.14 cm   RIGHT VENTRICLE            IVC RV Basal diam:  5.10 cm    IVC diam: 2.80 cm RV Mid diam:    3.50 cm RV S prime:     6.48 cm/s TAPSE (M-mode): 1.4 cm  LEFT ATRIUM              Index        RIGHT ATRIUM           Index LA diam:        5.20 cm  2.97 cm/m  RA Area:     30.20 cm LA Vol (A2C):   156.0 ml 89.25 ml/m  RA Volume:   113.00 ml 64.65 ml/m LA Vol (A4C):   91.9 ml  52.58 ml/m LA Biplane Vol: 122.0 ml 69.79 ml/m AORTIC VALVE AV Area (Vmax):    1.64 cm AV Area (Vmean):   1.48 cm AV Area (VTI):     1.58 cm AV Vmax:           180.00 cm/s AV Vmean:          115.000 cm/s AV VTI:            0.300 m AV Peak Grad:      13.0 mmHg AV Mean Grad:      6.0 mmHg LVOT Vmax:         93.80 cm/s LVOT Vmean:        54.000 cm/s LVOT VTI:          0.151 m LVOT/AV VTI ratio: 0.50  AORTA Ao Root diam: 3.00 cm Ao Asc diam:  3.00 cm  MR Peak grad:    87.2 mmHg    TRICUSPID VALVE MR Mean grad:    51.0 mmHg    TR Peak grad:   54.5 mmHg MR Vmax:         467.00 cm/s  TR Vmax:        369.00 cm/s MR Vmean:        335.0 cm/s MR PISA:         6.28 cm     SHUNTS MR PISA Eff ROA: 41 mm       Systemic VTI:  0.15 m MR PISA Radius:  1.00 cm      Systemic Diam: 2.00 cm  Jenkins Rouge MD Electronically signed by Jenkins Rouge MD Signature Date/Time: 03/02/2022/3:30:23 PM    Final     CT SCANS  CT CORONARY MORPH W/CTA COR W/SCORE 12/30/2012  Narrative *RADIOLOGY  REPORT*  INDICATION: 88 year old male with history of severe aortic stenosis.  Preprocedural study for possible transcatheter aortic valve replacement (TAVR) procedure.  CT ANGIOGRAPHY OF THE HEART, CORONARY ARTERY, STRUCTURE, AND MORPHOLOGY  COMPARISON:  No priors.  CONTRAST: 60m OMNIPAQUE IOHEXOL 350 MG/ML SOLN  TECHNIQUE:  CT angiography of the coronary vessels was performed on a 256 channel system using prospective ECG gating.  A scout and ECG- gated noncontrast exam (for calcium scoring) were performed. Appropriate delay was determined by bolus tracking after injection of iodinated contrast, and an ECG-gated coronary CTA was performed with sub-mm slice collimation throughout the cardiac cycle. Imaging post processing was performed on an independent workstation creating multiplanar and 3-D images, allowing for quantitative analysis of the heart and coronary arteries.  Note that this exam targets the heart and the chest was not imaged in its entirety.  PREMEDICATION: None.  FINDINGS: Technical quality: Good. Heart rate:  55-68 bpm (patient was in atrial fibrillation).  CORONARY ARTERIES: Assessment not performed secondary to the excessive imaging noise, small amount of motion related artifact (related to irregular rhythm) and high degree of coronary artery calcification.  The patient has had a recent catheter angiogram as well.  Aortic Root Measurements Pertinent to Potential TAVR Procedure:  Aortic Valve Description:  The aortic valve was tricuspid with severe thickening and calcification of all of the cusps.  During systole, there was limited valve opening with an estimated aortic valve area of approximately 0.84 cm2 by planimetry.  Aortic Valve Area: 0.84 cm-sq  Annulus: Planimetry -  5.6 cm2 Long & Short Axis - 31.0 x 25.2 mm Circumference - 87.3 mm  Sinuses of Valsalva: R SOV -     width 34.1 mm height 26.8 mm L SOV -     width 33.5 mm  height 20.3  mm [place] SOV -    width 33.6 mm height 21.0 mm  Coronary Artery [place]: L main - 15.2 mm from the annulus RCA - 19.8 mm from the annulus  Ascending Aorta: 33.1 x 32.0 mm in diameter 4 cm above the annulus.  Potential Angiographic Angles: LAO 11 degrees, Caudal 7 degrees RAO 10 degrees, Caudal 28 degrees  OTHER AORTA AND PULMONARY MEASUREMENTS: Descending aorta:  (< 40 mm): 31 mm Main pulmonary artery: (< 30 mm): 28 mm  OTHER FINDINGS: Within the visualized portions of the thorax there are no suspicious appearing pulmonary nodules, areas of airspace consolidation or pleural effusions.  Mild diffuse bronchial wall thickening and some mild paraseptal emphysematous changes are noted. There is no significant pericardial fluid, thickening or pericardial calcification.  Numerous borderline enlarged and mildly enlarged mediastinal lymph nodes are noted, largest of which measures up to 1.3 cm in short axis in the subcarinal station. There are no aggressive appearing lytic or blastic lesions noted in the visualized portions of the skeleton.  IMPRESSION: 1.  Severely sclerotic aortic valve with an estimated aortic valve area of 0.84 cm2 by planimetry.  Findings and measurements pertinent to potential TAVR procedure, as detailed above. 2. Numerous borderline enlarged and mildly enlarged mediastinal lymph nodes are nonspecific.  Similar findings were seen in the abdomen and pelvis (see that report for discussion and details). Clinical correlation is recommended.  Another differential consideration would include an underlying lymphoproliferative disorder. 3.  Mild diffuse bronchial wall thickening with mild paraseptal emphysematous changes.   Original Report Authenticated By: Vinnie Langton, M.D.          Recent Labs: 12/05/2021: NT-Pro BNP 6,343 12/14/2021: Magnesium 2.1 03/01/2022: B Natriuretic Peptide 2,092.5; TSH 1.200 08/13/2022: ALT 21 08/19/2022: BUN 40; Creatinine, Ser 1.62;  Hemoglobin 9.6; Platelets 180; Potassium 4.6; Sodium 143  Recent Lipid Panel    Component Value Date/Time   CHOL 81 (L) 08/17/2018 0748   TRIG 82 08/17/2018 0748   HDL 28 (L) 08/17/2018 0748   CHOLHDL 2.9 08/17/2018 0748   LDLCALC 37 08/17/2018 0748        Physical Exam:    VS:  BP (!) 125/59   Pulse 76   Ht 5' 8.5" (1.74 m)   Wt 148 lb (67.1 kg)   SpO2 93%   BMI 22.18 kg/m     Wt Readings from Last 3 Encounters:  10/04/22 148 lb (67.1 kg)  09/23/22 150 lb (68 kg)  09/11/22 149 lb (67.6 kg)   Gen: Elderly chronically ill appearing Neck: JVD  Cardiac: No Rubs or Gallops, IRRI with systolic Murmur, +2 radial pulses Respiratory: Clear to auscultation bilaterally, normal effort, noormal  respiratory rate GI: Soft, nontender, non-distended  MS: +1 edema; bilateral LE wounds Neuro:  At time of evaluation, alert and oriented to person/place/time/situation  Psych: Normal affect, patient feels ok   ASSESSMENT:    1. Atrial fibrillation, chronic ([place])   2. Wounds, multiple   3. S/P aortic valve replacement with bioprosthetic valve   4. Nonrheumatic mitral valve regurgitation   5. Type 2 diabetes mellitus with hyperlipidemia (H[facility])    PLAN:     Chronic diastolic heart failure Pulmonary hypertension Right ventricular failure/severe mitral regurgitation/severe tricuspid regurgitation  CKD stage IIIb -slightly hypervolemic -Echocardiogram 4/23 EF 50 to 55%, RV moderately reduced in setting of severe pulmonary hypertension secondary to severe MR -Not a Procedural candidate -Continue Lasix 60 twice daily, metoprolol XL 12.5 mg daily, and Farxiga 10 mg daily; K for now  LE wounds - he is pending home health assessment (Patient and daugther tell me Dr. Orland Mustard has order this), I can't confirm this so will send wound care referral just in case  HLD - continue statin for now - his confluence is concerning for malignancy, he is onc f/u - we have discussed GOC at length today;  we have not made any changes  Permanent atrial fibrillation Anemia -Not a watchman candidate - not on AC because of anemia - on ASA may need toc ome off     Time Spent Directly with Patient:   I have spent a total of 40 minutes with the patient reviewing notes, imaging, EKGs, labs and examining the patient as well as establishing an assessment and plan that was discussed personally with the patient.  > 50% of time was spent in direct patient care and family and reviewing imaging with patient .        Six months  Medication Adjustments/Labs and Tests Ordered: Current medicines are reviewed at length with the patient today.  Concerns regarding medicines are outlined above.  Orders Placed This Encounter  Procedures   CBC   AMB referral to wound care center   No orders of the defined types were placed in this encounter.   Patient Instructions  Medication Instructions:  Your physician recommends that you continue on your current medications as directed. Please refer to the Current Medication list given to you today.  *If you need a refill on your cardiac medications before your next appointment, please call your pharmacy*   Lab Work: TODAY: CBC  If you have labs (blood work) drawn today and your tests are completely normal, you will receive your results only by: [place] (if you have MyChart) OR A paper copy in the mail If you have any lab test that is abnormal or we need to change your treatment, we will call you to review the results.   Testing/Procedures: Your physician has made a referral for you to see the [facility].    Follow-Up: At [facility], you and your health needs are our priority.  As part of our continuing mission to provide you with exceptional heart care, we have created designated Provider Care Teams.  These Care Teams include your primary Cardiologist (physician) and Advanced Practice Providers (APPs -  Physician Assistants and  Nurse Practitioners) who all work together to provide you with the care you need, when you need it.  We recommend signing up for the patient portal called "MyChart".  Sign up information is provided on this After Visit Summary.  MyChart is used to connect with patients for Virtual Visits (Telemedicine).  Patients are able to view lab/test results, encounter notes, upcoming appointments, etc.  Non-urgent messages can be sent to your provider as well.   To learn more about what you can do with MyChart, go to [email or web address].    Your next appointment:   6 month(s)  The format for your next appointment:   In Person  Provider:   [name], MD     Other Instructions Please monitor your Blood Pressure as directed by Dr. Gasper Sells.   Important Information About Sugar  Signed, [name], MD  10/04/2022 10:57 AM    [place]

## 2022-10-04 ENCOUNTER — Encounter: Payer: Self-pay | Admitting: Internal Medicine

## 2022-10-04 ENCOUNTER — Ambulatory Visit: Payer: Medicare Other | Attending: Internal Medicine | Admitting: Internal Medicine

## 2022-10-04 VITALS — BP 125/59 | HR 76 | Ht 68.5 in | Wt 148.0 lb

## 2022-10-04 DIAGNOSIS — I340 Nonrheumatic mitral (valve) insufficiency: Secondary | ICD-10-CM | POA: Diagnosis not present

## 2022-10-04 DIAGNOSIS — E1169 Type 2 diabetes mellitus with other specified complication: Secondary | ICD-10-CM | POA: Diagnosis not present

## 2022-10-04 DIAGNOSIS — T07XXXA Unspecified multiple injuries, initial encounter: Secondary | ICD-10-CM | POA: Diagnosis not present

## 2022-10-04 DIAGNOSIS — Z953 Presence of xenogenic heart valve: Secondary | ICD-10-CM

## 2022-10-04 DIAGNOSIS — E785 Hyperlipidemia, unspecified: Secondary | ICD-10-CM

## 2022-10-04 DIAGNOSIS — I482 Chronic atrial fibrillation, unspecified: Secondary | ICD-10-CM | POA: Diagnosis not present

## 2022-10-04 LAB — CBC
Hematocrit: 30.9 % — ABNORMAL LOW (ref 37.5–51.0)
Hemoglobin: 10.1 g/dL — ABNORMAL LOW (ref 13.0–17.7)
MCH: 30.7 pg (ref 26.6–33.0)
MCHC: 32.7 g/dL (ref 31.5–35.7)
MCV: 94 fL (ref 79–97)
Platelets: 183 10*3/uL (ref 150–450)
RBC: 3.29 x10E6/uL — ABNORMAL LOW (ref 4.14–5.80)
RDW: 16.2 % — ABNORMAL HIGH (ref 11.6–15.4)
WBC: 4.9 10*3/uL (ref 3.4–10.8)

## 2022-10-04 NOTE — Patient Instructions (Signed)
Medication Instructions:  Your physician recommends that you continue on your current medications as directed. Please refer to the Current Medication list given to you today.  *If you need a refill on your cardiac medications before your next appointment, please call your pharmacy*   Lab Work: TODAY: CBC  If you have labs (blood work) drawn today and your tests are completely normal, you will receive your results only by: [place] (if you have MyChart) OR A paper copy in the mail If you have any lab test that is abnormal or we need to change your treatment, we will call you to review the results.   Testing/Procedures: Your physician has made a referral for you to see the [facility].    Follow-Up: At [facility], you and your health needs are our priority.  As part of our continuing mission to provide you with exceptional heart care, we have created designated Provider Care Teams.  These Care Teams include your primary Cardiologist (physician) and Advanced Practice Providers (APPs -  Physician Assistants and Nurse Practitioners) who all work together to provide you with the care you need, when you need it.  We recommend signing up for the patient portal called "MyChart".  Sign up information is provided on this After Visit Summary.  MyChart is used to connect with patients for Virtual Visits (Telemedicine).  Patients are able to view lab/test results, encounter notes, upcoming appointments, etc.  Non-urgent messages can be sent to your provider as well.   To learn more about what you can do with MyChart, go to [email or web address].    Your next appointment:   6 month(s)  The format for your next appointment:   In Person  Provider:   [name], MD     Other Instructions Please monitor your Blood Pressure as directed by Dr. Gasper Sells.   Important Information About Sugar

## 2022-10-14 ENCOUNTER — Ambulatory Visit
Admission: RE | Admit: 2022-10-14 | Discharge: 2022-10-14 | Disposition: A | Discharge status: Home / Self Care | Payer: Medicare Other | Source: Ambulatory Visit | Attending: Gastroenterology | Admitting: Gastroenterology

## 2022-10-14 DIAGNOSIS — R9389 Abnormal findings on diagnostic imaging of other specified body structures: Secondary | ICD-10-CM

## 2022-10-14 DIAGNOSIS — K769 Liver disease, unspecified: Secondary | ICD-10-CM | POA: Diagnosis not present

## 2022-10-14 MED ORDER — GADOPICLENOL 0.5 MMOL/ML IV SOLN
7.5000 mL | Freq: Once | INTRAVENOUS | Status: AC | PRN
  Administered 2022-10-14: 7.5 mL via INTRAVENOUS

## 2022-10-15 ENCOUNTER — Encounter (HOSPITAL_BASED_OUTPATIENT_CLINIC_OR_DEPARTMENT_OTHER): Payer: Medicare Other | Attending: Internal Medicine | Admitting: Internal Medicine

## 2022-10-15 DIAGNOSIS — I2510 Atherosclerotic heart disease of native coronary artery without angina pectoris: Secondary | ICD-10-CM | POA: Diagnosis not present

## 2022-10-15 DIAGNOSIS — I130 Hypertensive heart and chronic kidney disease with heart failure and stage 1 through stage 4 chronic kidney disease, or unspecified chronic kidney disease: Secondary | ICD-10-CM | POA: Diagnosis not present

## 2022-10-15 DIAGNOSIS — D500 Iron deficiency anemia secondary to blood loss (chronic): Secondary | ICD-10-CM | POA: Diagnosis not present

## 2022-10-15 DIAGNOSIS — E11628 Type 2 diabetes mellitus with other skin complications: Secondary | ICD-10-CM

## 2022-10-15 DIAGNOSIS — C61 Malignant neoplasm of prostate: Secondary | ICD-10-CM | POA: Diagnosis not present

## 2022-10-15 DIAGNOSIS — E875 Hyperkalemia: Secondary | ICD-10-CM | POA: Diagnosis not present

## 2022-10-15 DIAGNOSIS — I5043 Acute on chronic combined systolic (congestive) and diastolic (congestive) heart failure: Secondary | ICD-10-CM | POA: Diagnosis not present

## 2022-10-15 DIAGNOSIS — R0602 Shortness of breath: Secondary | ICD-10-CM | POA: Diagnosis not present

## 2022-10-15 DIAGNOSIS — I739 Peripheral vascular disease, unspecified: Secondary | ICD-10-CM | POA: Diagnosis not present

## 2022-10-15 DIAGNOSIS — I872 Venous insufficiency (chronic) (peripheral): Secondary | ICD-10-CM | POA: Diagnosis not present

## 2022-10-15 DIAGNOSIS — I5A Non-ischemic myocardial injury (non-traumatic): Secondary | ICD-10-CM | POA: Diagnosis not present

## 2022-10-15 DIAGNOSIS — I4821 Permanent atrial fibrillation: Secondary | ICD-10-CM | POA: Diagnosis not present

## 2022-10-15 DIAGNOSIS — I4891 Unspecified atrial fibrillation: Secondary | ICD-10-CM | POA: Diagnosis not present

## 2022-10-15 DIAGNOSIS — Z66 Do not resuscitate: Secondary | ICD-10-CM | POA: Diagnosis not present

## 2022-10-15 DIAGNOSIS — E785 Hyperlipidemia, unspecified: Secondary | ICD-10-CM | POA: Diagnosis not present

## 2022-10-15 DIAGNOSIS — I4819 Other persistent atrial fibrillation: Secondary | ICD-10-CM | POA: Diagnosis not present

## 2022-10-15 DIAGNOSIS — I272 Pulmonary hypertension, unspecified: Secondary | ICD-10-CM | POA: Diagnosis not present

## 2022-10-15 DIAGNOSIS — E860 Dehydration: Secondary | ICD-10-CM | POA: Diagnosis not present

## 2022-10-15 DIAGNOSIS — Z79899 Other long term (current) drug therapy: Secondary | ICD-10-CM | POA: Diagnosis not present

## 2022-10-15 DIAGNOSIS — I5031 Acute diastolic (congestive) heart failure: Secondary | ICD-10-CM | POA: Diagnosis not present

## 2022-10-15 DIAGNOSIS — N179 Acute kidney failure, unspecified: Secondary | ICD-10-CM | POA: Diagnosis not present

## 2022-10-15 DIAGNOSIS — N1832 Chronic kidney disease, stage 3b: Secondary | ICD-10-CM | POA: Diagnosis not present

## 2022-10-15 DIAGNOSIS — I2722 Pulmonary hypertension due to left heart disease: Secondary | ICD-10-CM | POA: Diagnosis not present

## 2022-10-15 DIAGNOSIS — Z87891 Personal history of nicotine dependence: Secondary | ICD-10-CM | POA: Diagnosis not present

## 2022-10-15 DIAGNOSIS — E43 Unspecified severe protein-calorie malnutrition: Secondary | ICD-10-CM | POA: Diagnosis not present

## 2022-10-15 DIAGNOSIS — E876 Hypokalemia: Secondary | ICD-10-CM | POA: Diagnosis not present

## 2022-10-15 DIAGNOSIS — Z923 Personal history of irradiation: Secondary | ICD-10-CM | POA: Diagnosis not present

## 2022-10-15 DIAGNOSIS — Y92239 Unspecified place in hospital as the place of occurrence of the external cause: Secondary | ICD-10-CM | POA: Diagnosis not present

## 2022-10-15 DIAGNOSIS — N189 Chronic kidney disease, unspecified: Secondary | ICD-10-CM | POA: Diagnosis not present

## 2022-10-15 DIAGNOSIS — E1122 Type 2 diabetes mellitus with diabetic chronic kidney disease: Secondary | ICD-10-CM | POA: Diagnosis not present

## 2022-10-15 DIAGNOSIS — I10 Essential (primary) hypertension: Secondary | ICD-10-CM | POA: Diagnosis not present

## 2022-10-15 DIAGNOSIS — I5033 Acute on chronic diastolic (congestive) heart failure: Secondary | ICD-10-CM | POA: Diagnosis not present

## 2022-10-15 DIAGNOSIS — Z6822 Body mass index (BMI) 22.0-22.9, adult: Secondary | ICD-10-CM | POA: Diagnosis not present

## 2022-10-15 DIAGNOSIS — R627 Adult failure to thrive: Secondary | ICD-10-CM | POA: Diagnosis not present

## 2022-10-15 DIAGNOSIS — E1169 Type 2 diabetes mellitus with other specified complication: Secondary | ICD-10-CM | POA: Diagnosis not present

## 2022-10-15 DIAGNOSIS — R7989 Other specified abnormal findings of blood chemistry: Secondary | ICD-10-CM | POA: Diagnosis not present

## 2022-10-15 DIAGNOSIS — L89152 Pressure ulcer of sacral region, stage 2: Secondary | ICD-10-CM | POA: Diagnosis not present

## 2022-10-15 DIAGNOSIS — Z953 Presence of xenogenic heart valve: Secondary | ICD-10-CM | POA: Diagnosis not present

## 2022-10-16 ENCOUNTER — Inpatient Hospital Stay (HOSPITAL_BASED_OUTPATIENT_CLINIC_OR_DEPARTMENT_OTHER): Payer: Medicare Other | Admitting: Internal Medicine

## 2022-10-16 ENCOUNTER — Inpatient Hospital Stay: Payer: Medicare Other | Attending: Physician Assistant

## 2022-10-16 ENCOUNTER — Other Ambulatory Visit: Payer: Self-pay

## 2022-10-16 VITALS — BP 113/61 | HR 83 | Temp 97.2°F | Resp 16 | Wt 154.2 lb

## 2022-10-16 DIAGNOSIS — D500 Iron deficiency anemia secondary to blood loss (chronic): Secondary | ICD-10-CM | POA: Diagnosis not present

## 2022-10-16 DIAGNOSIS — D62 Acute posthemorrhagic anemia: Secondary | ICD-10-CM

## 2022-10-16 LAB — CBC WITH DIFFERENTIAL (CANCER CENTER ONLY)
Abs Immature Granulocytes: 0.01 10*3/uL (ref 0.00–0.07)
Basophils Absolute: 0.1 10*3/uL (ref 0.0–0.1)
Basophils Relative: 1 %
Eosinophils Absolute: 0.1 10*3/uL (ref 0.0–0.5)
Eosinophils Relative: 3 %
HCT: 31.6 % — ABNORMAL LOW (ref 39.0–52.0)
Hemoglobin: 10.5 g/dL — ABNORMAL LOW (ref 13.0–17.0)
Immature Granulocytes: 0 %
Lymphocytes Relative: 14 %
Lymphs Abs: 0.8 10*3/uL (ref 0.7–4.0)
MCH: 32.6 pg (ref 26.0–34.0)
MCHC: 33.2 g/dL (ref 30.0–36.0)
MCV: 98.1 fL (ref 80.0–100.0)
Monocytes Absolute: 0.8 10*3/uL (ref 0.1–1.0)
Monocytes Relative: 14 %
Neutro Abs: 3.8 10*3/uL (ref 1.7–7.7)
Neutrophils Relative %: 68 %
Platelet Count: 168 10*3/uL (ref 150–400)
RBC: 3.22 MIL/uL — ABNORMAL LOW (ref 4.22–5.81)
RDW: 17.4 % — ABNORMAL HIGH (ref 11.5–15.5)
WBC Count: 5.6 10*3/uL (ref 4.0–10.5)
nRBC: 0.4 % — ABNORMAL HIGH (ref 0.0–0.2)

## 2022-10-16 LAB — IRON AND IRON BINDING CAPACITY (CC-WL,HP ONLY)
Iron: 77 ug/dL (ref 45–182)
Saturation Ratios: 22 % (ref 17.9–39.5)
TIBC: 345 ug/dL (ref 250–450)
UIBC: 268 ug/dL

## 2022-10-16 LAB — SAMPLE TO BLOOD BANK

## 2022-10-16 NOTE — Progress Notes (Signed)
[name] [phone]   [phone]  OFFICE PROGRESS NOTE  [name], MD [address] [place] [address]  DIAGNOSIS: Iron deficiency anemia secondary to history of gastrointestinal bleeding secondary to gastric ulcer.  PRIOR THERAPY: Iron infusion with Venofer 300 Mg IV weekly for 3 weeks  CURRENT THERAPY: Oral iron tablet with ferrous sulfate 325 mg p.o. twice daily.  INTERVAL HISTORY: [name] 88 y.o. male returns to the clinic today for follow-up visit accompanied by his daughter.  The patient continues to complain of generalized fatigue and weakness as well as shortness of breath with exertion.  He also has been gaining weight recently which could be secondary to fluid retention especially with the swelling of the lower extremities.  He denied having any nausea, vomiting, diarrhea or constipation.  The patient was treated with iron infusion and tolerated it fairly well.  He is currently on over-the-counter ferrous sulfate and tolerating it fairly well.  He is here today for evaluation and repeat blood work.  MEDICAL HISTORY: Past Medical History:  Diagnosis Date   Anemia    LOW PLATELETS OTHER DAY  PER PT   Anticoagulated on Coumadin 12/21/2012   Aortic stenosis    Arthritis    "left wrist; back sometimes" (01/21/2013)   Atrial fibrillation ([place])    [name], DR COOPER   Bradycardia    Exertional shortness of breath    Heart murmur    "I've had it for years; runs in the family on daddy's side" (01/21/2013)   HTN (hypertension)    Hyperlipemia 12/21/2012   Prostate cancer ([place])    "had 40 tx of radiation in 2009" (01/21/2013)   S/P aortic valve replacement with bioprosthetic valve 01/19/2013   Transcatheter Aortic Valve Replacement using 73m Sapien bioprosthetic tissue valve via transapical approach   Type II diabetes mellitus ([facility])     ALLERGIES:  is allergic to amoxicillin.  MEDICATIONS:   Current Outpatient Medications  Medication Sig Dispense Refill   acetaminophen (TYLENOL) 650 MG CR tablet Take 650 mg by mouth daily before breakfast.     Ascorbic Acid (VITAMIN C) 1000 MG tablet Take 1,000 mg by mouth daily.     aspirin EC 81 MG tablet Take 1 tablet (81 mg total) by mouth daily. Swallow whole. 30 tablet 0   cetirizine (ZYRTEC) 10 MG tablet Take 10 mg by mouth daily.     cholecalciferol (VITAMIN D3) 25 MCG (1000 UNIT) tablet Take 1,000 Units by mouth 2 (two) times daily with a meal.     dapagliflozin propanediol (FARXIGA) 10 MG TABS tablet Take 1 tablet (10 mg total) by mouth daily. 30 tablet 0   ferrous sulfate 325 (65 FE) MG tablet Take 1 tablet (325 mg total) by mouth 2 (two) times daily with a meal. 60 tablet 0   furosemide (LASIX) 40 MG tablet Take 1 tablet (40 mg total) by mouth 2 (two) times daily. (Patient taking differently: Take 40 mg by mouth 2 (two) times daily. 60 mg twice daily) 60 tablet 0   gabapentin (NEURONTIN) 100 MG capsule Take 1 capsule (100 mg total) by mouth at bedtime. 90 capsule 3   glipiZIDE (GLUCOTROL XL) 2.5 MG 24 hr tablet Take 2.5 mg by mouth every morning.     KLOR-CON M20 20 MEQ tablet Take 40 mEq by mouth 3 (three) times a week. Mon, wed, fri     metFORMIN (GLUCOPHAGE) 1000 MG tablet Take  1 tablet (1,000 mg total) by mouth 2 (two) times daily with breakfast and lunch. 60 tablet 0   metoprolol succinate (TOPROL-XL) 25 MG 24 hr tablet Take 0.5 tablets (12.5 mg total) by mouth daily. 15 tablet 0   OXYGEN Inhale 2 L into the lungs as needed (shortness of breath).     pantoprazole (PROTONIX) 40 MG tablet Take 1 tablet (40 mg total) by mouth 2 (two) times daily. Take  1 tab of 40 mg tab  by mouth twice a day till 02/16/22 then 1 tab of 40 mg by mouth daily starting 02/17/22 60 tablet 0   rosuvastatin (CRESTOR) 5 MG tablet TAKE 1 TABLET BY MOUTH ONCE DAILY AT  6PM 90 tablet 3   Vitamin A 3 MG (10000 UT) TABS Take 1,000 mg by mouth daily at 6 (six) AM.      XTANDI 40 MG capsule Take 160 mg by mouth daily.     No current facility-administered medications for this visit.    SURGICAL HISTORY:  Past Surgical History:  Procedure Laterality Date   BALLOON DILATION N/A 08/02/2020   Procedure: BALLOON DILATION;  Surgeon: [name], MD;  Location: [facility];  Service: Gastroenterology;  Laterality: N/A;   BIOPSY  10/03/2021   Procedure: BIOPSY;  Surgeon: [name], MD;  [facility];  Service: Endoscopy;;   BIOPSY  12/14/2021   Procedure: BIOPSY;  Surgeon: Wilford Corner, MD;  Location: [facility];  Service: Endoscopy;;   CARDIAC CATHETERIZATION  12/16/2012   CARDIAC VALVE REPLACEMENT  01/19/2013   AVR   CATARACT EXTRACTION W/ INTRAOCULAR LENS  IMPLANT, BILATERAL     ESOPHAGOGASTRODUODENOSCOPY N/A 10/03/2021   Procedure: ESOPHAGOGASTRODUODENOSCOPY (EGD);  Surgeon: [name], MD;  [facility];  Service: Endoscopy;  Laterality: N/A;   ESOPHAGOGASTRODUODENOSCOPY N/A 12/14/2021   Procedure: ESOPHAGOGASTRODUODENOSCOPY (EGD);  Surgeon: Wilford Corner, MD;  Location: [facility];  Service: Endoscopy;  Laterality: N/A;   ESOPHAGOGASTRODUODENOSCOPY (EGD) WITH PROPOFOL N/A 08/02/2020   Procedure: ESOPHAGOGASTRODUODENOSCOPY (EGD) WITH PROPOFOL;  Surgeon: [name], MD;  [facility];  Service: Gastroenterology;  Laterality: N/A;   EYE SURGERY     INTRAOPERATIVE TRANSESOPHAGEAL ECHOCARDIOGRAM N/A 01/19/2013   Procedure: INTRAOPERATIVE TRANSESOPHAGEAL ECHOCARDIOGRAM;  Surgeon: [name], MD;  Location: [facility];  Service: Open Heart Surgery;  Laterality: N/A;   ORIF FOREARM FRACTURE Left 1954   "compound fx" (01/21/2013)   TRANSTHORACIC ECHOCARDIOGRAM  09/04/10, 09/07/08    REVIEW OF SYSTEMS:  A comprehensive review of systems was negative except for: Constitutional: positive for fatigue Respiratory: positive for dyspnea on exertion   PHYSICAL EXAMINATION: General appearance: alert, cooperative, fatigued,  and no distress Head: Normocephalic, without obvious abnormality, atraumatic Neck: no adenopathy, no JVD, supple, symmetrical, trachea midline, and thyroid not enlarged, symmetric, no tenderness/mass/nodules Lymph nodes: Cervical, supraclavicular, and axillary nodes normal. Resp: clear to auscultation bilaterally Back: symmetric, no curvature. ROM normal. No CVA tenderness. Cardio: regular rate and rhythm, S1, S2 normal, no murmur, click, rub or gallop GI: soft, non-tender; bowel sounds normal; no masses,  no organomegaly Extremities: extremities normal, atraumatic, no cyanosis or edema  ECOG PERFORMANCE STATUS: 1 - Symptomatic but completely ambulatory  Blood pressure 113/61, pulse 83, temperature (!) 97.2 F (36.2 C), temperature source Oral, resp. rate 16, weight 154 lb 3.2 oz (69.9 kg), SpO2 100 %.  LABORATORY DATA: Lab Results  Component Value Date   WBC 5.6 10/16/2022   HGB 10.5 (L) 10/16/2022   HCT 31.6 (L) 10/16/2022   MCV 98.1 10/16/2022  PLT 168 10/16/2022      Chemistry      Component Value Date/Time   NA 143 08/19/2022 1532   K 4.6 08/19/2022 1532   CL 99 08/19/2022 1532   CO2 28 08/19/2022 1532   BUN 40 (H) 08/19/2022 1532   CREATININE 1.62 (H) 08/19/2022 1532   CREATININE 1.75 (H) 08/13/2022 1336   GLU 396 03/22/2022 0000      Component Value Date/Time   CALCIUM 8.9 08/19/2022 1532   ALKPHOS 411 (H) 08/13/2022 1336   AST 30 08/13/2022 1336   ALT 21 08/13/2022 1336   BILITOT 1.0 08/13/2022 1336       RADIOGRAPHIC STUDIES: MR ABDOMEN MRCP W WO CONTAST  Result Date: 10/14/2022 CLINICAL DATA:  Characterize suspected liver lesions identified by prior CT, history of prostate cancer EXAM: MRI ABDOMEN WITHOUT AND WITH CONTRAST (INCLUDING MRCP) TECHNIQUE: Multiplanar multisequence MR imaging of the abdomen was performed both before and after the administration of intravenous contrast. Heavily T2-weighted images of the biliary and pancreatic ducts were  obtained, and three-dimensional MRCP images were rendered by post processing. CONTRAST:  7.5 mL Vueway gadolinium contrast IV COMPARISON:  CT abdomen pelvis, 09/13/2022 FINDINGS: Lower chest: Small right pleural effusion. Cardiomegaly with hepatic vein reflux. Hepatobiliary: No solid liver abnormality is seen. Coarse, nodular contour of the liver. Intrinsic T2 hypointensity as well as signal inversion on in and opposed phase imaging consistent with hepatic iron deposition. No gallstones, gallbladder wall thickening, or biliary dilatation. Pancreas: Unremarkable. No pancreatic ductal dilatation or surrounding inflammatory changes. Spleen: Normal in size without significant abnormality. Adrenals/Urinary Tract: Adrenal glands are unremarkable. Simple, benign bilateral renal cortical cysts, for which no further follow-up or characterization is required. Kidneys are otherwise normal, without renal calculi, solid lesion, or hydronephrosis. Stomach/Bowel: Stomach is within normal limits. No evidence of bowel wall thickening, distention, or inflammatory changes. Vascular/Lymphatic: Aortic atherosclerosis. No enlarged abdominal lymph nodes. Other: No abdominal wall hernia or abnormality. Small volume perihepatic and perisplenic ascites. Musculoskeletal: No acute or significant osseous findings. IMPRESSION: 1. Coarse, nodular contour of the liver, suggestive of cirrhosis. 2. No focal liver lesion or suspicious contrast enhancement to correspond to suspected findings of prior CT 3. Hepatic iron deposition, a potential etiology of chronic liver disease. 4. Small volume ascites. 5. Small right pleural effusion. 6. Cardiomegaly with hepatic vein reflux suggesting elevated right heart pressure. Aortic Atherosclerosis (ICD10-I70.0). Electronically Signed   By: Delanna Ahmadi M.D.   On: 10/14/2022 21:23    ASSESSMENT AND PLAN: This is a very pleasant 88 years old white male with history of iron deficiency anemia secondary to  gastrointestinal blood loss.  The patient was treated with iron infusion with Venofer 300 Mg IV weekly for 3 weeks and tolerated it fairly well. He is currently on treatment with oral iron tablet with no concerning adverse effects. His hemoglobin improved to 10.5 and hematocrit 31.6%. Recommended for the patient to continue with the oral iron tablets for now. I will see him back for follow-up visit in 3 months for evaluation and repeat blood work. For the shortness of breath and weight gain secondary to fluid retention, I recommended for the patient to follow-up with his primary care physician and cardiologist for management of his suspicious congestive heart failure. The patient was advised to call immediately if he has any other concerning symptoms in the interval.  The patient voices understanding of current disease status and treatment options and is in agreement with the current care plan.  All  questions were answered. The patient knows to call the clinic with any problems, questions or concerns. We can certainly see the patient much sooner if necessary.  The total time spent in the appointment was 20 minutes.  Disclaimer: This note was dictated with voice recognition software. Similar sounding words can inadvertently be transcribed and may not be corrected upon review.

## 2022-10-17 ENCOUNTER — Telehealth: Payer: Self-pay | Admitting: Internal Medicine

## 2022-10-17 DIAGNOSIS — I5032 Chronic diastolic (congestive) heart failure: Secondary | ICD-10-CM

## 2022-10-17 LAB — FERRITIN: Ferritin: 188 ng/mL (ref 24–336)

## 2022-10-17 MED ORDER — FUROSEMIDE 40 MG PO TABS: 80.0000 mg | ORAL_TABLET | Freq: Two times a day (BID) | ORAL | 6 refills | Status: DC

## 2022-10-17 NOTE — Telephone Encounter (Signed)
Pt c/o swelling: STAT is pt has developed SOB within 24 hours  If swelling, where is the swelling located? Not that they can see  How much weight have you gained and in what time span? 5 pounds slowly in 2 days  Have you gained 3 pounds in a day or 5 pounds in a week? 5 pounds in 2 days  Do you have a log of your daily weights (if so, list)? 155 lbs today, 154 lbs yesterday, 153 lbs the day before  Are you currently taking a fluid pill? Yes, lasix  Are you currently SOB? No, gets SOB with activity  Have you traveled recently? no   Patient's daughter says the patient had an MRI done and it showed fluid. She says he has been more SOB with activity and is not eating well. She says he is not feeling well. She says he does not have any swelling that they can see and he does take lasix.

## 2022-10-17 NOTE — Telephone Encounter (Signed)
When I initially returned Sherrie's call at 2:30 to assess patient's symptoms, Sherrie stated patient was more comfortable and resting.

## 2022-10-17 NOTE — Telephone Encounter (Signed)
Called daughter Alba Cory back regarding patient's SOB and weight gain. Sherrie reports patient weighed 148 lbs on visit with Dr. Dan Maker on 12/1, was 155 today 12/14, 154 yesterday 12/13 at oncology appt, and 153 on 12/12. She reports he has dyspnea on exertion but feels his legs look similar in size to when Dr. Gasper Sells saw him 12/1. She reports she has been giving him 80 mg lasix in the morning and 60 mg lasix in the evening. Dr. Gasper Sells documented in 12/1 note that previous trials of torsemide had worsened AKI.   Spoke with Dr. Gasper Sells today, new order for increased lasix placed, order for BMET placed for lab work to be completed in one week. Called daughter Alba Cory back and discussed recommendations. Sherrie agrees to plan. Per Dr. Gasper Sells, also advised Sherrie to see PCP to discuss goals of care.

## 2022-10-18 ENCOUNTER — Encounter (HOSPITAL_COMMUNITY): Payer: Self-pay

## 2022-10-18 ENCOUNTER — Other Ambulatory Visit: Payer: Self-pay

## 2022-10-18 ENCOUNTER — Inpatient Hospital Stay (HOSPITAL_COMMUNITY)
Admission: EM | Admit: 2022-10-18 | Discharge: 2022-10-21 | DRG: 291 | Disposition: A | Discharge status: Home Health | Payer: Medicare Other | Attending: Family Medicine | Admitting: Family Medicine

## 2022-10-18 ENCOUNTER — Emergency Department (HOSPITAL_COMMUNITY): Payer: Medicare Other

## 2022-10-18 DIAGNOSIS — E876 Hypokalemia: Secondary | ICD-10-CM | POA: Diagnosis present

## 2022-10-18 DIAGNOSIS — E43 Unspecified severe protein-calorie malnutrition: Secondary | ICD-10-CM | POA: Diagnosis present

## 2022-10-18 DIAGNOSIS — I10 Essential (primary) hypertension: Secondary | ICD-10-CM | POA: Diagnosis not present

## 2022-10-18 DIAGNOSIS — I130 Hypertensive heart and chronic kidney disease with heart failure and stage 1 through stage 4 chronic kidney disease, or unspecified chronic kidney disease: Principal | ICD-10-CM | POA: Diagnosis present

## 2022-10-18 DIAGNOSIS — R627 Adult failure to thrive: Secondary | ICD-10-CM | POA: Diagnosis present

## 2022-10-18 DIAGNOSIS — I5A Non-ischemic myocardial injury (non-traumatic): Secondary | ICD-10-CM | POA: Diagnosis present

## 2022-10-18 DIAGNOSIS — Z87891 Personal history of nicotine dependence: Secondary | ICD-10-CM | POA: Diagnosis not present

## 2022-10-18 DIAGNOSIS — R7989 Other specified abnormal findings of blood chemistry: Secondary | ICD-10-CM

## 2022-10-18 DIAGNOSIS — N179 Acute kidney failure, unspecified: Secondary | ICD-10-CM | POA: Diagnosis present

## 2022-10-18 DIAGNOSIS — Z6822 Body mass index (BMI) 22.0-22.9, adult: Secondary | ICD-10-CM

## 2022-10-18 DIAGNOSIS — E1122 Type 2 diabetes mellitus with diabetic chronic kidney disease: Secondary | ICD-10-CM | POA: Diagnosis present

## 2022-10-18 DIAGNOSIS — Z66 Do not resuscitate: Secondary | ICD-10-CM | POA: Diagnosis present

## 2022-10-18 DIAGNOSIS — Z953 Presence of xenogenic heart valve: Secondary | ICD-10-CM

## 2022-10-18 DIAGNOSIS — Z923 Personal history of irradiation: Secondary | ICD-10-CM

## 2022-10-18 DIAGNOSIS — Z9841 Cataract extraction status, right eye: Secondary | ICD-10-CM

## 2022-10-18 DIAGNOSIS — I739 Peripheral vascular disease, unspecified: Secondary | ICD-10-CM | POA: Diagnosis present

## 2022-10-18 DIAGNOSIS — Z8249 Family history of ischemic heart disease and other diseases of the circulatory system: Secondary | ICD-10-CM

## 2022-10-18 DIAGNOSIS — Z800 Family history of malignant neoplasm of digestive organs: Secondary | ICD-10-CM

## 2022-10-18 DIAGNOSIS — E1169 Type 2 diabetes mellitus with other specified complication: Secondary | ICD-10-CM | POA: Diagnosis not present

## 2022-10-18 DIAGNOSIS — N1832 Chronic kidney disease, stage 3b: Secondary | ICD-10-CM | POA: Diagnosis present

## 2022-10-18 DIAGNOSIS — I4819 Other persistent atrial fibrillation: Secondary | ICD-10-CM

## 2022-10-18 DIAGNOSIS — I272 Pulmonary hypertension, unspecified: Secondary | ICD-10-CM | POA: Insufficient documentation

## 2022-10-18 DIAGNOSIS — Z7984 Long term (current) use of oral hypoglycemic drugs: Secondary | ICD-10-CM

## 2022-10-18 DIAGNOSIS — E875 Hyperkalemia: Secondary | ICD-10-CM | POA: Diagnosis present

## 2022-10-18 DIAGNOSIS — Z880 Allergy status to penicillin: Secondary | ICD-10-CM

## 2022-10-18 DIAGNOSIS — I081 Rheumatic disorders of both mitral and tricuspid valves: Secondary | ICD-10-CM | POA: Diagnosis present

## 2022-10-18 DIAGNOSIS — I2722 Pulmonary hypertension due to left heart disease: Secondary | ICD-10-CM | POA: Diagnosis present

## 2022-10-18 DIAGNOSIS — N189 Chronic kidney disease, unspecified: Secondary | ICD-10-CM

## 2022-10-18 DIAGNOSIS — D500 Iron deficiency anemia secondary to blood loss (chronic): Secondary | ICD-10-CM | POA: Diagnosis present

## 2022-10-18 DIAGNOSIS — E785 Hyperlipidemia, unspecified: Secondary | ICD-10-CM | POA: Diagnosis present

## 2022-10-18 DIAGNOSIS — I5033 Acute on chronic diastolic (congestive) heart failure: Secondary | ICD-10-CM | POA: Insufficient documentation

## 2022-10-18 DIAGNOSIS — L89152 Pressure ulcer of sacral region, stage 2: Secondary | ICD-10-CM | POA: Diagnosis present

## 2022-10-18 DIAGNOSIS — R296 Repeated falls: Secondary | ICD-10-CM | POA: Diagnosis present

## 2022-10-18 DIAGNOSIS — Z7982 Long term (current) use of aspirin: Secondary | ICD-10-CM

## 2022-10-18 DIAGNOSIS — I4821 Permanent atrial fibrillation: Secondary | ICD-10-CM | POA: Diagnosis present

## 2022-10-18 DIAGNOSIS — I509 Heart failure, unspecified: Secondary | ICD-10-CM

## 2022-10-18 DIAGNOSIS — Z79899 Other long term (current) drug therapy: Secondary | ICD-10-CM | POA: Diagnosis not present

## 2022-10-18 DIAGNOSIS — Y92239 Unspecified place in hospital as the place of occurrence of the external cause: Secondary | ICD-10-CM | POA: Diagnosis not present

## 2022-10-18 DIAGNOSIS — R54 Age-related physical debility: Secondary | ICD-10-CM | POA: Diagnosis present

## 2022-10-18 DIAGNOSIS — Z9981 Dependence on supplemental oxygen: Secondary | ICD-10-CM

## 2022-10-18 DIAGNOSIS — T501X5A Adverse effect of loop [high-ceiling] diuretics, initial encounter: Secondary | ICD-10-CM | POA: Diagnosis not present

## 2022-10-18 DIAGNOSIS — Z9842 Cataract extraction status, left eye: Secondary | ICD-10-CM

## 2022-10-18 DIAGNOSIS — Z808 Family history of malignant neoplasm of other organs or systems: Secondary | ICD-10-CM

## 2022-10-18 DIAGNOSIS — Z833 Family history of diabetes mellitus: Secondary | ICD-10-CM

## 2022-10-18 DIAGNOSIS — I2510 Atherosclerotic heart disease of native coronary artery without angina pectoris: Secondary | ICD-10-CM | POA: Diagnosis present

## 2022-10-18 DIAGNOSIS — I952 Hypotension due to drugs: Secondary | ICD-10-CM | POA: Diagnosis not present

## 2022-10-18 DIAGNOSIS — I5031 Acute diastolic (congestive) heart failure: Secondary | ICD-10-CM

## 2022-10-18 DIAGNOSIS — Z823 Family history of stroke: Secondary | ICD-10-CM

## 2022-10-18 DIAGNOSIS — Z8546 Personal history of malignant neoplasm of prostate: Secondary | ICD-10-CM

## 2022-10-18 DIAGNOSIS — I5043 Acute on chronic combined systolic (congestive) and diastolic (congestive) heart failure: Secondary | ICD-10-CM | POA: Insufficient documentation

## 2022-10-18 DIAGNOSIS — I493 Ventricular premature depolarization: Secondary | ICD-10-CM | POA: Diagnosis present

## 2022-10-18 DIAGNOSIS — I071 Rheumatic tricuspid insufficiency: Secondary | ICD-10-CM | POA: Insufficient documentation

## 2022-10-18 DIAGNOSIS — E860 Dehydration: Secondary | ICD-10-CM | POA: Diagnosis present

## 2022-10-18 DIAGNOSIS — Z961 Presence of intraocular lens: Secondary | ICD-10-CM | POA: Diagnosis present

## 2022-10-18 LAB — BASIC METABOLIC PANEL
Anion gap: 14 (ref 5–15)
BUN: 68 mg/dL — ABNORMAL HIGH (ref 8–23)
CO2: 18 mmol/L — ABNORMAL LOW (ref 22–32)
Calcium: 9.1 mg/dL (ref 8.9–10.3)
Chloride: 103 mmol/L (ref 98–111)
Creatinine, Ser: 2.03 mg/dL — ABNORMAL HIGH (ref 0.61–1.24)
GFR, Estimated: 31 mL/min — ABNORMAL LOW (ref >60)
Glucose, Bld: 167 mg/dL — ABNORMAL HIGH (ref 70–99)
Potassium: 5.2 mmol/L — ABNORMAL HIGH (ref 3.5–5.1)
Sodium: 135 mmol/L (ref 135–145)

## 2022-10-18 LAB — BRAIN NATRIURETIC PEPTIDE: B Natriuretic Peptide: 1918.3 pg/mL — ABNORMAL HIGH (ref 0.0–100.0)

## 2022-10-18 LAB — URINALYSIS, ROUTINE W REFLEX MICROSCOPIC
Bacteria, UA: NONE SEEN
Bilirubin Urine: NEGATIVE
Glucose, UA: >=500 mg/dL — AB
Hgb urine dipstick: NEGATIVE
Ketones, ur: NEGATIVE mg/dL
Leukocytes,Ua: NEGATIVE
Nitrite: NEGATIVE
Protein, ur: NEGATIVE mg/dL
Specific Gravity, Urine: 1.01 (ref 1.005–1.030)
pH: 5 (ref 5.0–8.0)

## 2022-10-18 LAB — CBC
HCT: 32.8 % — ABNORMAL LOW (ref 39.0–52.0)
Hemoglobin: 10.6 g/dL — ABNORMAL LOW (ref 13.0–17.0)
MCH: 31.9 pg (ref 26.0–34.0)
MCHC: 32.3 g/dL (ref 30.0–36.0)
MCV: 98.8 fL (ref 80.0–100.0)
Platelets: 158 10*3/uL (ref 150–400)
RBC: 3.32 MIL/uL — ABNORMAL LOW (ref 4.22–5.81)
RDW: 17.8 % — ABNORMAL HIGH (ref 11.5–15.5)
WBC: 5.4 10*3/uL (ref 4.0–10.5)
nRBC: 0.6 % — ABNORMAL HIGH (ref 0.0–0.2)

## 2022-10-18 LAB — PROTIME-INR
INR: 1.5 — ABNORMAL HIGH (ref 0.8–1.2)
Prothrombin Time: 17.9 seconds — ABNORMAL HIGH (ref 11.4–15.2)

## 2022-10-18 LAB — TROPONIN I (HIGH SENSITIVITY)
Troponin I (High Sensitivity): 189 ng/L (ref <18)
Troponin I (High Sensitivity): 163 ng/L (ref <18)

## 2022-10-18 MED ORDER — FUROSEMIDE 10 MG/ML IJ SOLN
120.0000 mg | Freq: Once | INTRAVENOUS | Status: AC
  Administered 2022-10-18: 120 mg via INTRAVENOUS
  Filled 2022-10-18: qty 10

## 2022-10-18 MED ORDER — SODIUM CHLORIDE 0.9 % IV SOLN: Freq: Once | INTRAVENOUS | Status: DC

## 2022-10-18 NOTE — H&P (Incomplete)
History and Physical    Patient: [name] KDX:[id number] DOB: 15-Oct-1933 DOA: 10/18/2022 DOS: the patient was seen and examined on 10/18/2022 PCP: [name], MD  Patient coming from: Home  Chief Complaint:  Chief Complaint  Patient presents with  . Shortness of Breath   HPI: [name] is a 88 y.o. 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    In the ED, he was afebrile, normotensive on room air.  Hemoglobin is stable at baseline at 10.6.  Mild hypokalemia of 5.2, worsening creatinine of 2.03 from prior of 1.62.  BNP of 1918.  Troponin of 189.EKG is rate controlled in A-fib. chest x-ray was negative.  EDP discussed with cardiology who will see in consultation. Review of Systems: As mentioned in the history of present illness. All other systems reviewed and are negative. Past Medical History:  Diagnosis Date  . Anemia    LOW PLATELETS OTHER DAY  PER PT  . Anticoagulated on Coumadin 12/21/2012  . Aortic stenosis   . Arthritis    "left wrist; back sometimes" (01/21/2013)  . Atrial fibrillation ([place])    [name], DR COOPER  . Bradycardia   . Exertional shortness of breath   . Heart murmur    "I've had it for years; runs in the family on daddy's side" (01/21/2013)  . HTN (hypertension)   . Hyperlipemia 12/21/2012  . Prostate cancer [facility])    "had 40 tx of radiation in 2009" (01/21/2013)  . S/P aortic valve replacement with bioprosthetic valve 01/19/2013   Transcatheter Aortic Valve Replacement using 76m Sapien bioprosthetic tissue valve via transapical approach  . [place]    Past Surgical History:   Procedure Laterality Date  . BALLOON DILATION N/A 08/02/2020   Procedure: BALLOON DILATION;  Surgeon: [name] MD;  [facility];  Service: Gastroenterology;  Laterality: N/A;  . BIOPSY  10/03/2021   Procedure: BIOPSY;  Surgeon: [name] MD;  [facility];  Service: Endoscopy;;  . BIOPSY  12/14/2021   Procedure: BIOPSY;  Surgeon: [name] MD;  [place]  Service: Endoscopy;;  . CARDIAC CATHETERIZATION  12/16/2012  . CARDIAC VALVE REPLACEMENT  01/19/2013   AVR  . CATARACT EXTRACTION W/ INTRAOCULAR LENS  IMPLANT, BILATERAL    . ESOPHAGOGASTRODUODENOSCOPY N/A 10/03/2021   Procedure: ESOPHAGOGASTRODUODENOSCOPY (EGD);  Surgeon: [name] MD;  [facility];  Service: Endoscopy;  Laterality: N/A;  . ESOPHAGOGASTRODUODENOSCOPY N/A 12/14/2021   Procedure: ESOPHAGOGASTRODUODENOSCOPY (EGD);  Surgeon: [name] MD;  [place]  Service: Endoscopy;  Laterality: N/A;  . ESOPHAGOGASTRODUODENOSCOPY (EGD) WITH PROPOFOL N/A 08/02/2020   Procedure: ESOPHAGOGASTRODUODENOSCOPY (EGD) WITH PROPOFOL;  Surgeon: [name] MD;  [facility];  Service: Gastroenterology;  Laterality: N/A;  . EYE SURGERY    . INTRAOPERATIVE TRANSESOPHAGEAL ECHOCARDIOGRAM N/A 01/19/2013   Procedure: INTRAOPERATIVE TRANSESOPHAGEAL ECHOCARDIOGRAM;  Surgeon: CRexene Alberts MD;  [place]  Service: Open Heart Surgery;  Laterality: N/A;  . ORIF FOREARM FRACTURE Left 1954   "compound fx" (01/21/2013)  . TRANSTHORACIC ECHOCARDIOGRAM  09/04/10, 09/07/08   Social History:  reports that he quit smoking about 46 years ago. His smoking use included cigarettes. He  has a 44.00 pack-year smoking history. He has never used smokeless tobacco. He reports that he does not use drugs. No history on file for alcohol use.  Allergies  Allergen Reactions  . Amoxicillin Other (See Comments)    Burning of mouth, tongue and lips.  itchy, burning throat     Family History  Problem Relation Age of Onset  . Diabetes Mother   . Melanoma Mother 22  . Bone cancer Mother 57  . Pancreatic cancer Father 3  . Heart disease Father   . Stroke Sister   . Cancer Brother 20       ? brain cancer    Prior to Admission medications   Medication Sig Start Date End Date Taking? Authorizing Provider  acetaminophen (TYLENOL) 650 MG CR tablet Take 650 mg by mouth daily before breakfast.    [provider]  Ascorbic Acid (VITAMIN C) 1000 MG tablet Take 1,000 mg by mouth daily.    [provider]  aspirin EC 81 MG tablet Take 1 tablet (81 mg total) by mouth daily. Swallow whole. 04/01/22   Medina-Vargas, Monina C, NP  cetirizine (ZYRTEC) 10 MG tablet Take 10 mg by mouth daily.    [provider]  cholecalciferol (VITAMIN D3) 25 MCG (1000 UNIT) tablet Take 1,000 Units by mouth 2 (two) times daily with a meal.    [provider]  dapagliflozin propanediol (FARXIGA) 10 MG TABS tablet Take 1 tablet (10 mg total) by mouth daily. 04/01/22   Medina-Vargas, Monina C, NP  ferrous sulfate 325 (65 FE) MG tablet Take 1 tablet (325 mg total) by mouth 2 (two) times daily with a meal. 04/01/22   Medina-Vargas, Monina C, NP  furosemide (LASIX) 40 MG tablet Take 2 tablets (80 mg total) by mouth 2 (two) times daily. 10/17/22   Chandrasekhar, Terisa Starr, MD  gabapentin (NEURONTIN) 100 MG capsule Take 1 capsule (100 mg total) by mouth at bedtime. 09/23/22   Patel, Donika K, DO  glipiZIDE (GLUCOTROL XL) 2.5 MG 24 hr tablet Take 2.5 mg by mouth every morning. 07/19/22   [provider]  KLOR-CON M20 20 MEQ tablet Take 40 mEq by mouth 3 (three) times a week. Jory Sims, Ludwig Clarks 08/21/22   [provider]  metFORMIN (GLUCOPHAGE) 1000 MG tablet Take 1 tablet (1,000 mg total) by mouth 2 (two) times daily with breakfast and lunch. 04/01/22   Medina-Vargas, Monina C, NP  metoprolol succinate (TOPROL-XL) 25 MG 24 hr tablet Take 0.5 tablets (12.5 mg  total) by mouth daily. 04/01/22 09/23/22  Medina-Vargas, Monina C, NP  OXYGEN Inhale 2 L into the lungs as needed (shortness of breath).    [provider]  pantoprazole (PROTONIX) 40 MG tablet Take 1 tablet (40 mg total) by mouth 2 (two) times daily. Take  1 tab of 40 mg tab  by mouth twice a day till 02/16/22 then 1 tab of 40 mg by mouth daily starting 02/17/22 04/01/22   Medina-Vargas, Monina C, NP  rosuvastatin (CRESTOR) 5 MG tablet TAKE 1 TABLET BY MOUTH ONCE DAILY AT  6PM 09/03/22   Elgie Collard, PA-C  Vitamin A 3 MG (10000 UT) TABS Take 1,000 mg by mouth daily at 6 (six) AM.    [provider]  XTANDI 40 MG capsule Take 160 mg by mouth daily. 09/13/22   [provider]    Physical Exam: Vitals:   10/18/22 1454 10/18/22 1817  BP: 120/67 (!) 103/58  Pulse: 78 89  Resp: Marland Kitchen)  22 17  Temp: (!) 97.5 F (36.4 C)   SpO2: 100% 100%   *** Data Reviewed: {Tip this will not be part of the note when signed- Document your independent interpretation of telemetry tracing, EKG, lab, Radiology test or any other diagnostic tests. Add any new diagnostic test ordered today. (Optional):26781} {Results:26384}  Assessment and Plan: No notes have been filed under this hospital service. Service: Hospitalist     Advance Care Planning:   Code Status: Prior ***  Consults: ***  Family Communication: ***  Severity of Illness: {Observation/Inpatient:21159}  Author: Orene Desanctis, DO 10/18/2022 11:48 PM  For on call review [email or web address].

## 2022-10-18 NOTE — Consult Note (Addendum)
Cardiology Consultation   Patient ID: [name] MRN: [id number]; DOB: 15-Jun-1933  Admit date: 10/18/2022 Date of Consult: 10/18/2022  PCP:  [name], [facility] Cardiologist:  [name], MD        Patient Profile:   [name] is a 88 y.o. male with a hx of HFpEF (EF >55%), severe AS s/p TAVR (Edwards 26, 2014), severe MR/TR, pHTN, CAD, HTN, HLD, persistent AF (on Eliquis), CKD3 (bl sCr ~1.2-1.4), PAD and DM2 who is being seen 10/18/2022 for the evaluation of CHF exacerbation At the request of Dr. Gilford Raid.  History of Present Illness:   The following history was obtained from the patient as well as chart review.  [name] reports that he was in his usual state of health until ~1 week ago when he developed generalized fatigue, SOB/DOE, and peripheral edema.  He recently underwent an abdominal MRI on 12/11 and he states that he has not " felt right since."  Over the same time course he has further experienced an unintentional 7 lb weight gain despite adherence to his home diuretics.  He further endorses orthopnea. He denies chest pain, palpitations, fevers, chills, sweats, sore throat, rhinorrhea, nausea, vomiting, abdominal pain, focal weakness or numbness, urinary changes, rash, presyncope, or syncope.  He recently saw Dr. Gasper Sells in clinic on 10/04/2022.  During that visit the patient was not endorsing any breathing issues and has stable peripheral edema.  He was noted to be mildly hypervolemic but was otherwise stable so his torsemide dose was held constant.  Wilburn Mylar, his daughter called cardiology regarding the patient's symptoms and was instructed to increase his a.m. dose of Lasix to 80 mg in an effort to improve diuresis.  Unfortunately, his symptoms worsened prompting him to come to the ED for further evaluation. Of note the patient denies tobacco, alcohol, and illicit drug use.  His daughter helps him with his  medications but he endorses great adherence.  He has no further complaints.  In the ED his VS were afebrile, BP 120/67, HR 78, RR 18 satting 100% on RA.  Labs notable for hemoglobin 10.6, Na+ 135, K+ 5.2, bicarb 18, BUN 68, sCr 2.03, BNP 1918, and troponins 189 -> 163.  EKG showed TWI in 1 and aVL and atrial fibrillation with a controlled rate response.  CXR was negative for pulmonary edema.  Cardiology is consulted for evaluation.   Past Medical History:  Diagnosis Date   Anemia    LOW PLATELETS OTHER DAY  PER PT   Anticoagulated on Coumadin 12/21/2012   Aortic stenosis    Arthritis    "left wrist; back sometimes" (01/21/2013)   Atrial fibrillation ([place])    [name], DR COOPER   Bradycardia    Exertional shortness of breath    Heart murmur    "I've had it for years; runs in the family on daddy's side" (01/21/2013)   HTN (hypertension)    Hyperlipemia 12/21/2012   Prostate cancer ([place])    "had 40 tx of radiation in 2009" (01/21/2013)   S/P aortic valve replacement with bioprosthetic valve 01/19/2013   Transcatheter Aortic Valve Replacement using 4m Sapien bioprosthetic tissue valve via transapical approach   Type II diabetes mellitus ([place]     Past Surgical History:  Procedure Laterality Date   BALLOON DILATION N/A 08/02/2020   Procedure: BALLOON DILATION;  Surgeon: [name] MD;  Location: [facility];  Service: Gastroenterology;  Laterality: N/A;   BIOPSY  10/03/2021   Procedure: BIOPSY;  Surgeon: [name], MD;  Location: [facility];  Service: Endoscopy;;   BIOPSY  12/14/2021   Procedure: BIOPSY;  Surgeon: Wilford Corner, MD;  Location: [facility];  Service: Endoscopy;;   CARDIAC CATHETERIZATION  12/16/2012   CARDIAC VALVE REPLACEMENT  01/19/2013   AVR   CATARACT EXTRACTION W/ INTRAOCULAR LENS  IMPLANT, BILATERAL     ESOPHAGOGASTRODUODENOSCOPY N/A 10/03/2021   Procedure: ESOPHAGOGASTRODUODENOSCOPY (EGD);  Surgeon: [name], MD;  Location: [facility];  Service:  Endoscopy;  Laterality: N/A;   ESOPHAGOGASTRODUODENOSCOPY N/A 12/14/2021   Procedure: ESOPHAGOGASTRODUODENOSCOPY (EGD);  Surgeon: Wilford Corner, MD;  Location: [facility];  Service: Endoscopy;  Laterality: N/A;   ESOPHAGOGASTRODUODENOSCOPY (EGD) WITH PROPOFOL N/A 08/02/2020   Procedure: ESOPHAGOGASTRODUODENOSCOPY (EGD) WITH PROPOFOL;  Surgeon: [name], MD;  Location: [facility];  Service: Gastroenterology;  Laterality: N/A;   EYE SURGERY     INTRAOPERATIVE TRANSESOPHAGEAL ECHOCARDIOGRAM N/A 01/19/2013   Procedure: INTRAOPERATIVE TRANSESOPHAGEAL ECHOCARDIOGRAM;  Surgeon: [name], MD;  Location: [facility];  Service: Open Heart Surgery;  Laterality: N/A;   ORIF FOREARM FRACTURE Left 1954   "compound fx" (01/21/2013)   TRANSTHORACIC ECHOCARDIOGRAM  09/04/10, 09/07/08     Home Medications:  Prior to Admission medications   Medication Sig Start Date End Date Taking? Authorizing Provider  acetaminophen (TYLENOL) 650 MG CR tablet Take 650 mg by mouth daily before breakfast.    [provider]  Ascorbic Acid (VITAMIN C) 1000 MG tablet Take 1,000 mg by mouth daily.    [provider]  aspirin EC 81 MG tablet Take 1 tablet (81 mg total) by mouth daily. Swallow whole. 04/01/22   Medina-Vargas, Monina C, NP  cetirizine (ZYRTEC) 10 MG tablet Take 10 mg by mouth daily.    [provider]  cholecalciferol (VITAMIN D3) 25 MCG (1000 UNIT) tablet Take 1,000 Units by mouth 2 (two) times daily with a meal.    [provider]  dapagliflozin propanediol (FARXIGA) 10 MG TABS tablet Take 1 tablet (10 mg total) by mouth daily. 04/01/22   Medina-Vargas, Monina C, NP  ferrous sulfate 325 (65 FE) MG tablet Take 1 tablet (325 mg total) by mouth 2 (two) times daily with a meal. 04/01/22   Medina-Vargas, Monina C, NP  furosemide (LASIX) 40 MG tablet Take 2 tablets (80 mg total) by mouth 2 (two) times daily. 10/17/22   Chandrasekhar, Terisa Starr, MD  gabapentin (NEURONTIN) 100 MG  capsule Take 1 capsule (100 mg total) by mouth at bedtime. 09/23/22   Patel, Donika K, DO  glipiZIDE (GLUCOTROL XL) 2.5 MG 24 hr tablet Take 2.5 mg by mouth every morning. 07/19/22   [provider]  KLOR-CON M20 20 MEQ tablet Take 40 mEq by mouth 3 (three) times a week. Jory Sims, Ludwig Clarks 08/21/22   [provider]  metFORMIN (GLUCOPHAGE) 1000 MG tablet Take 1 tablet (1,000 mg total) by mouth 2 (two) times daily with breakfast and lunch. 04/01/22   Medina-Vargas, Monina C, NP  metoprolol succinate (TOPROL-XL) 25 MG 24 hr tablet Take 0.5 tablets (12.5 mg total) by mouth daily. 04/01/22 09/23/22  Medina-Vargas, Monina C, NP  OXYGEN Inhale 2 L into the lungs as needed (shortness of breath).    [provider]  pantoprazole (PROTONIX) 40 MG tablet Take 1 tablet (40 mg total) by mouth 2 (two) times daily. Take  1 tab of 40 mg tab  by mouth twice a day till 02/16/22 then 1 tab of 40 mg by mouth  daily starting 02/17/22 04/01/22   Medina-Vargas, Monina C, NP  rosuvastatin (CRESTOR) 5 MG tablet TAKE 1 TABLET BY MOUTH ONCE DAILY AT  6PM 09/03/22   Elgie Collard, PA-C  Vitamin A 3 MG (10000 UT) TABS Take 1,000 mg by mouth daily at 6 (six) AM.    [provider]  XTANDI 40 MG capsule Take 160 mg by mouth daily. 09/13/22   [provider]    Inpatient Medications: Scheduled Meds:  Continuous Infusions:  PRN Meds:   Allergies:    Allergies  Allergen Reactions   Amoxicillin Other (See Comments)    Burning of mouth, tongue and lips.  itchy, burning throat    Social History:   Social History   Socioeconomic History   Marital status: Married    Spouse name: [name]   Number of children: 2   Years of education: Not on file   Highest education level: High school graduate  Occupational History   Occupation: Retired  Tobacco Use   Smoking status: Former    Packs/day: 2.00    Years: 22.00    Total pack years: 44.00    Types: Cigarettes    Quit date: 09/11/1976     Years since quitting: 46.1   Smokeless tobacco: Never  Vaping Use   Vaping Use: Never used  Substance and Sexual Activity   Alcohol use: Not on file   Drug use: No   Sexual activity: Never  Other Topics Concern   Not on file  Social History Narrative   Are you right handed or left handed? Right Handed    Are you currently employed ?    What is your current occupation?   Do you live at home alone?   Who lives with you?    What type of home do you live in: 1 story or 2 story? Lives in a one story home        Social Determinants of Health   Financial Resource Strain: Not on file  Food Insecurity: No Food Insecurity (03/04/2022)   Hunger Vital Sign    Worried About Running Out of Food in the Last Year: Never true    Ran Out of Food in the Last Year: Never true  Transportation Needs: No Transportation Needs (03/04/2022)   PRAPARE - Hydrologist (Medical): No    Lack of Transportation (Non-Medical): No  Physical Activity: Not on file  Stress: Not on file  Social Connections: Not on file  Intimate Partner Violence: Not on file    Family History:    Family History  Problem Relation Age of Onset   Diabetes Mother    Melanoma Mother 50   Bone cancer Mother 66   Pancreatic cancer Father 91   Heart disease Father    Stroke Sister    Cancer Brother 39       ? brain cancer     ROS:  Please see the history of present illness.  All other ROS reviewed and negative.     Physical Exam/Data:   Vitals:   10/18/22 1454 10/18/22 1817  BP: 120/67 (!) 103/58  Pulse: 78 89  Resp: (!) 22 17  Temp: (!) 97.5 F (36.4 C)   SpO2: 100% 100%   No intake or output data in the 24 hours ending 10/18/22 2159    10/16/2022    3:53 PM 10/04/2022   10:12 AM 09/23/2022    9:25 AM  Last 3 Weights  Weight (  lbs) 154 lb 3.2 oz 148 lb 150 lb  Weight (kg) 69.945 kg 67.132 kg 68.04 kg     There is no height or weight on file to calculate BMI.  General:  Frail-appearing elderly male in NAD, very pleasant HEENT: EOMI, PERRLA, atraumatic Neck: JVD up to tragus at 45 degrees (challenging to interpret in the setting of severe TR), supple Vascular: No carotid bruits; Distal pulses 2+ bilaterally Cardiac: Irregularly irregular rhythm, normal rate and T0/G2, II/VI systolic murmur heard at LLSB, II/VI systolic murmur heard at apex radiating to axilla, no rubs or gallops Lungs:  clear to auscultation bilaterally, no wheezing, rhonchi or rales  Abd: soft, nontender, no hepatomegaly  Ext: 1+ pitting edema to knees bilaterally, skin cool to touch bilaterally Musculoskeletal:  No deformities, BUE and BLE strength normal and equal Skin: Bilateral skin changes of lower extremities c/w stasis dermatitis? Neuro:  CNs 2-12 intact, no focal abnormalities noted Psych:  Normal affect   EKG:  The EKG was personally reviewed and demonstrates:  Regular rate, Afib, TWI in I and aVL     Telemetry:  Telemetry was personally reviewed and demonstrates:  Afib   Relevant CV Studies:  TTE 03/02/22:  IMPRESSIONS     1. Left ventricular ejection fraction, by estimation, is 55 to 60%. The  left ventricle has normal function. The left ventricle has no regional  wall motion abnormalities. Left ventricular diastolic parameters are  indeterminate.   2. Right ventricular systolic function is moderately reduced. The right  ventricular size is mildly enlarged. There is severely elevated pulmonary  artery systolic pressure.   3. Left atrial size was severely dilated.   4. Right atrial size was severely dilated.   5. ERO > .4 similar to prior echo and PISA radius 1 MAC with leaflet  thickening and restricted posterior leaflet . The mitral valve is normal  in structure. Severe mitral valve regurgitation. No evidence of mitral  stenosis.   6. Tricuspid valve regurgitation is severe.   7. Post TAVR with 26 mm Sapien 3 mild PVL seen best on SA views at 7:00  mean gradient  6 mmHg lower than TTE 11/21/21 . The aortic valve has been  repaired/replaced. Aortic valve regurgitation is not visualized. No aortic  stenosis is present.   8. The inferior vena cava is dilated in size with >50% respiratory  variability, suggesting right atrial pressure of 8 mmHg.   Laboratory Data:  High Sensitivity Troponin:   Recent Labs  Lab 10/18/22 1541 10/18/22 1740  TROPONINIHS 189* 163*     Chemistry Recent Labs  Lab 10/18/22 1541  NA 135  K 5.2*  CL 103  CO2 18*  GLUCOSE 167*  BUN 68*  CREATININE 2.03*  CALCIUM 9.1  GFRNONAA 31*  ANIONGAP 14    No results for input(s): "PROT", "ALBUMIN", "AST", "ALT", "ALKPHOS", "BILITOT" in the last 168 hours. Lipids No results for input(s): "CHOL", "TRIG", "HDL", "LABVLDL", "LDLCALC", "CHOLHDL" in the last 168 hours.  Hematology Recent Labs  Lab 10/16/22 1526 10/18/22 1541  WBC 5.6 5.4  RBC 3.22* 3.32*  HGB 10.5* 10.6*  HCT 31.6* 32.8*  MCV 98.1 98.8  MCH 32.6 31.9  MCHC 33.2 32.3  RDW 17.4* 17.8*  PLT 168 158   Thyroid No results for input(s): "TSH", "FREET4" in the last 168 hours.  BNP Recent Labs  Lab 10/18/22 1541  BNP 1,918.3*    DDimer No results for input(s): "DDIMER" in the last 168 hours.   Radiology/Studies:  DG Chest 2 View  Result Date: 10/18/2022 CLINICAL DATA:  Shortness of breath. EXAM: CHEST - 2 VIEW COMPARISON:  March 01, 2022. FINDINGS: Stable cardiomegaly. Both lungs are clear. The visualized skeletal structures are unremarkable. IMPRESSION: No active cardiopulmonary disease. Electronically Signed   By: Marijo Conception M.D.   On: 10/18/2022 16:12     Assessment and Plan:   BABE CLENNEY is a 88 y.o. male with a hx of HFpEF (EF >55%), severe AS s/p TAVR (Edwards 26, 2014), CAD, severe MR/TR, pHTN, HTN, HLD, persistent AF (on Eliquis), CKD3 (bl sCr ~1.2-1.4), PAD and DM2 who is being seen 10/18/2022 for evaluation of an acute on chronic HFpEF exacerbation.  #Acute on Chronic HFpEF  (EF >55%) Exacerbation #Group 2 PHTN #Severe MR/TR #Severe AS s/p TAVR :: Patient presents today with complaints of SOB/DOE and has evidence of hypervolemia and low output by physical exam.  His BNP and creatinine are elevated as well.  His presentation is most consistent with an acute on chronic CHF exacerbation.  The exact etiology of his decompensation is unclear at this time.  He denies any infectious symptoms, dietary discretion, or symptoms of ACS.  He has known severe valvular disease that is not amenable to structural intervention so this could be the natural progression of his underlying valvular disease.  I think he warrants a TTE to ensure that there is no worsening of his EF or his TAVR valve.  Will plan to diurese to improve his volume status. -Give Lasix IV 120 mg (goal net -1-2L) -Hold home torsemide 80 mg AM/60 mg PM -Hold home metoprolol succinate 12.5 mg daily given cool extremities and possible low output state.  Can restart once stable. -TTE complete to assess EF and valvular pathologies -Strict ins and outs -Daily weights -Maintain K >4, Mg>2 -Per chart review patient is not a candidate for mitral or tricuspid valve structural interventions -Check lactate -Check BMP 4 hours post diuresis -Consider adding spironolactone this hospitalization if potassium and creatinine are stable  #Type II NSTEMI #CAD :: Patient with elevated troponins in the setting of acute decompensated CHF and cardiorenal syndrome.  No active chest pain, but EKG with TWI's in 1 and aVL.  This likely represents demand ischemia in the setting of CHF and known CAD.  Recommend treating the underlying issue of CHF as described above. -Continue ASA 81 mg daily -Continue Crestor 5 mg daily -Holding metoprolol as above  #Persistent Afib :: Remains in atrial fibrillation but is asymptomatic.  He does not take Proberta due to anemia.  He is on metoprolol succinate for rate control.  Per chart review he is not a  watchman candidate. -No OAC due to anemia -Holding metoprolol as described above with plans to restart when able -Maintain telemetry  #HTN #HLD -continue crestor -holding metoprolol as above  #AKI on CKD Likely Cardiorenal :: Baseline sCr ~1.2-1.4. sCr on admission is now 2.  I suspect that he has cardiorenal syndrome in setting of decompensated heart failure and that his creatinine will improve with diuresis.  I realize historically that he has had increased creatinine in the setting of diuresis; however, at this juncture he is hypervolemic.  If you run into issues with worsening creatinine despite diuresis then we can consider an RHC to get a better sense of his hemodynamics. -Diuresis as above -Daily RFP's - avoid nephrotoxic medications  #DM2 -Hold Dapa, metformin and glipizide while inpatient  #PAD -ASA and statin  #Chronic Anemia ::Recent iron studies  checked on 12/13 without IDA. CTM    Risk Assessment/Risk Scores:     TIMI Risk Score for Unstable Angina or Non-ST Elevation MI:   The patient's TIMI risk score is  , which indicates a  % risk of all cause mortality, new or recurrent myocardial infarction or need for urgent revascularization in the next 14 days.  New York Heart Association (NYHA) Functional Class NYHA Class IV  CHA2DS2-VASc Score = 5   This indicates a 7.2% annual risk of stroke. The patient's score is based upon: CHF History: 1 HTN History: 1 Diabetes History: 0 Stroke History: 0 Vascular Disease History: 1 Age Score: 2 Gender Score: 0         For questions or updates, please contact [place] Please consult [email or web address] for contact info under    Signed, Hershal Coria, MD  10/18/2022 9:59 PM

## 2022-10-18 NOTE — ED Triage Notes (Signed)
Pt arrived POV from home c/o [facility] that started on Monday. Pt does have a hx of CHF and has put on some weight this week. Per the cardiologist they were told to give him '80mg'$  of lasix twice a day for a couple days. Pt has decreased urine output even with the increase in lasix per family.

## 2022-10-18 NOTE — ED Provider Notes (Signed)
[facility] EMERGENCY DEPARTMENT Provider Note   CSN: [id number] Arrival date & time: 10/18/22  1445     History  Chief Complaint  Patient presents with   Shortness of Breath    [name] is a 88 y.o. male.  Pt is a 88 yo male with a pmhx significant for aortic stenosis s/p valve replacement, htn, bradycardia, hld, dm2, arthritis, and prostate cancer.  Pt's daughter gives the hx.  Pt has had increased sob and weakness for a week.  Pt has gained 7 lbs in a week.  Pt's daughter called cardiology and his lasix was increased to 80 mg bid for the last few days.  Daughter said he seems worse.  Pt denies cp.  Daughter said he has not been eating or drinking.  He has not been urinating much either.  Normally, he walks with a walker, but has been unable to walk.       Home Medications Prior to Admission medications   Medication Sig Start Date End Date Taking? Authorizing Provider  acetaminophen (TYLENOL) 650 MG CR tablet Take 650 mg by mouth daily before breakfast.    [provider]  Ascorbic Acid (VITAMIN C) 1000 MG tablet Take 1,000 mg by mouth daily.    [provider]  aspirin EC 81 MG tablet Take 1 tablet (81 mg total) by mouth daily. Swallow whole. 04/01/22   Medina-Vargas, Monina C, NP  cetirizine (ZYRTEC) 10 MG tablet Take 10 mg by mouth daily.    [provider]  cholecalciferol (VITAMIN D3) 25 MCG (1000 UNIT) tablet Take 1,000 Units by mouth 2 (two) times daily with a meal.    [provider]  dapagliflozin propanediol (FARXIGA) 10 MG TABS tablet Take 1 tablet (10 mg total) by mouth daily. 04/01/22   Medina-Vargas, Monina C, NP  ferrous sulfate 325 (65 FE) MG tablet Take 1 tablet (325 mg total) by mouth 2 (two) times daily with a meal. 04/01/22   Medina-Vargas, Monina C, NP  furosemide (LASIX) 40 MG tablet Take 2 tablets (80 mg total) by mouth 2 (two) times daily. 10/17/22   Chandrasekhar, Terisa Starr, MD  gabapentin  (NEURONTIN) 100 MG capsule Take 1 capsule (100 mg total) by mouth at bedtime. 09/23/22   Patel, Donika K, DO  glipiZIDE (GLUCOTROL XL) 2.5 MG 24 hr tablet Take 2.5 mg by mouth every morning. 07/19/22   [provider]  KLOR-CON M20 20 MEQ tablet Take 40 mEq by mouth 3 (three) times a week. Jory Sims, Ludwig Clarks 08/21/22   [provider]  metFORMIN (GLUCOPHAGE) 1000 MG tablet Take 1 tablet (1,000 mg total) by mouth 2 (two) times daily with breakfast and lunch. 04/01/22   Medina-Vargas, Monina C, NP  metoprolol succinate (TOPROL-XL) 25 MG 24 hr tablet Take 0.5 tablets (12.5 mg total) by mouth daily. 04/01/22 09/23/22  Medina-Vargas, Monina C, NP  OXYGEN Inhale 2 L into the lungs as needed (shortness of breath).    [provider]  pantoprazole (PROTONIX) 40 MG tablet Take 1 tablet (40 mg total) by mouth 2 (two) times daily. Take  1 tab of 40 mg tab  by mouth twice a day till 02/16/22 then 1 tab of 40 mg by mouth daily starting 02/17/22 04/01/22   Medina-Vargas, Monina C, NP  rosuvastatin (CRESTOR) 5 MG tablet TAKE 1 TABLET BY MOUTH ONCE DAILY AT  6PM 09/03/22   Elgie Collard, PA-C  Vitamin A 3 MG (10000 UT) TABS Take 1,000 mg  by mouth daily at 6 (six) AM.    [provider]  XTANDI 40 MG capsule Take 160 mg by mouth daily. 09/13/22   [provider]      Allergies    Amoxicillin    Review of Systems   Review of Systems  Constitutional:  Positive for appetite change.  Respiratory:  Positive for shortness of breath.   Neurological:  Positive for weakness.  All other systems reviewed and are negative.   Physical Exam Updated Vital Signs BP (!) 103/58   Pulse 89   Temp (!) 97.5 F (36.4 C)   Resp 17   SpO2 100%  Physical Exam Vitals and nursing note reviewed.  Constitutional:      Appearance: He is well-developed.  HENT:     Head: Normocephalic and atraumatic.  Eyes:     Extraocular Movements: Extraocular movements intact.     Pupils: Pupils are  equal, round, and reactive to light.  Cardiovascular:     Rate and Rhythm: Normal rate and regular rhythm.  Pulmonary:     Effort: Pulmonary effort is normal.     Breath sounds: Normal breath sounds.  Abdominal:     General: Bowel sounds are normal.     Palpations: Abdomen is soft.  Musculoskeletal:        General: Normal range of motion.     Cervical back: Normal range of motion and neck supple.     Right lower leg: Edema present.     Left lower leg: Edema present.  Skin:    General: Skin is warm.     Capillary Refill: Capillary refill takes less than 2 seconds.  Neurological:     General: No focal deficit present.     Mental Status: He is alert and oriented to person, place, and time.  Psychiatric:        Mood and Affect: Mood normal.        Behavior: Behavior normal.     ED Results / Procedures / Treatments   Labs (all labs ordered are listed, but only abnormal results are displayed) Labs Reviewed  BASIC METABOLIC PANEL - Abnormal; Notable for the following components:      Result Value   Potassium 5.2 (*)    CO2 18 (*)    Glucose, Bld 167 (*)    BUN 68 (*)    Creatinine, Ser 2.03 (*)    GFR, Estimated 31 (*)    All other components within normal limits  CBC - Abnormal; Notable for the following components:   RBC 3.32 (*)    Hemoglobin 10.6 (*)    HCT 32.8 (*)    RDW 17.8 (*)    nRBC 0.6 (*)    All other components within normal limits  BRAIN NATRIURETIC PEPTIDE - Abnormal; Notable for the following components:   B Natriuretic Peptide 1,918.3 (*)    All other components within normal limits  URINALYSIS, ROUTINE W REFLEX MICROSCOPIC - Abnormal; Notable for the following components:   Glucose, UA >=500 (*)    All other components within normal limits  TROPONIN I (HIGH SENSITIVITY) - Abnormal; Notable for the following components:   Troponin I (High Sensitivity) 189 (*)    All other components within normal limits  TROPONIN I (HIGH SENSITIVITY) - Abnormal;  Notable for the following components:   Troponin I (High Sensitivity) 163 (*)    All other components within normal limits  PROTIME-INR    EKG EKG Interpretation  Date/Time:  Friday  October 18 2022 14:48:57 EST Ventricular Rate:  76 PR Interval:    QRS Duration: 132 QT Interval:  414 QTC Calculation: 465 R Axis:   -62 Text Interpretation: Atrial fibrillation Left axis deviation Left ventricular hypertrophy with QRS widening and repolarization abnormality ( R in aVL , Cornell product ) Similar to prior EKGs Abnormal ECG When compared with ECG of 13-Mar-2022 11:30, PREVIOUS ECG IS PRESENT Confirmed by Cindee Lame [phone]) on 10/18/2022 3:20:55 PM  Radiology DG Chest 2 View  Result Date: 10/18/2022 CLINICAL DATA:  Shortness of breath. EXAM: CHEST - 2 VIEW COMPARISON:  March 01, 2022. FINDINGS: Stable cardiomegaly. Both lungs are clear. The visualized skeletal structures are unremarkable. IMPRESSION: No active cardiopulmonary disease. Electronically Signed   By: Marijo Conception M.D.   On: 10/18/2022 16:12    Procedures Procedures    Medications Ordered in ED Medications  0.9 %  sodium chloride infusion (has no administration in time range)    ED Course/ Medical Decision Making/ A&P                           Medical Decision Making Amount and/or Complexity of Data Reviewed Labs: ordered.  Risk Prescription drug management. Decision regarding hospitalization.   This patient presents to the ED for concern of sob, this involves an extensive number of treatment options, and is a complaint that carries with it a high risk of complications and morbidity.  The differential diagnosis includes chf exac, copd,    Co morbidities that complicate the patient evaluation  ortic stenosis s/p valve replacement, htn, bradycardia, hld, dm2, arthritis, and prostate cancer.   Additional history obtained:  Additional history obtained from epic chart review External records from outside  source obtained and reviewed including daughter   Lab Tests:  I Ordered, and personally interpreted labs.  The pertinent results include:  cbc with hgb 10.6 (chronic), bmp with bun elevated at 68 and cr 2.03 (40 and 1.62 in October); trop 189 and then 163, ua with >500 glucose; bnp 1918   Imaging Studies ordered:  I ordered imaging studies including cxr I independently visualized and interpreted imaging which showed  No active cardiopulmonary disease.  I agree with the radiologist interpretation   Cardiac Monitoring:  The patient was maintained on a cardiac monitor.  I personally viewed and interpreted the cardiac monitored which showed an underlying rhythm of: nsr   Medicines ordered and prescription drug management:  I ordered medication including ivfs  for dehydration Reevaluation of the patient after these medicines showed that the patient improved I have reviewed the patients home medicines and have made adjustments as needed   Consultations Obtained:  I requested consultation with the cardiologist,  and discussed lab and imaging findings as well as pertinent plan - they recommend: will see pt in the ED Pt d/w Dr. Flossie Buffy (triad) who will admit.   Problem List / ED Course:  FTT:  possibly due to dehydration.  Pt given gentle hydration. AKI:  likely due to increase in lasix Elevated troponins:  pt denies cp.  Cards consulted.   Reevaluation:  After the interventions noted above, I reevaluated the patient and found that they have :improved   Social Determinants of Health:  Lives at home   Dispostion:  After consideration of the diagnostic results and the patients response to treatment, I feel that the patent would benefit from admission.          Final Clinical  Impression(s) / ED Diagnoses Final diagnoses:  Failure to thrive in adult  AKI (acute kidney injury) ([place])  Dehydration  Elevated troponin    Rx / DC Orders ED Discharge Orders     None          Isla Pence, MD 10/18/22 2240

## 2022-10-18 NOTE — H&P (Signed)
History and Physical    Patient: [name] NAT:[id number] DOB: 10-25-1933 DOA: 10/18/2022 DOS: the patient was seen and examined on 10/19/2022 PCP: [name], MD  Patient coming from: Home  Chief Complaint:  Chief Complaint  Patient presents with   Shortness of Breath   HPI: [name] is a 88 y.o. 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   In the past week he has noticed increasing dyspnea on exertion and increase LE edema.  Barely able to walk 10 feet without being short of breath.  Had discussed with cardiology and had Lasix increased to 80 mg twice daily yesterday without any improvement in urine output.   In the ED, he was afebrile, normotensive on room air.  Hemoglobin is stable at baseline at 10.6.  Mild hypokalemia of 5.2, worsening creatinine of 2.03 from prior of 1.62.  BNP of 1918.  Troponin of 189.EKG is rate controlled in A-fib. chest x-ray was negative.  EDP discussed with cardiology who will see in consultation. Review of Systems: As mentioned in the history of present illness. All other systems reviewed and are negative. Past Medical History:  Diagnosis Date   Anemia    LOW PLATELETS OTHER DAY  PER PT   Anticoagulated on Coumadin 12/21/2012   Aortic stenosis    Arthritis    "left wrist; back sometimes" (01/21/2013)   Atrial fibrillation ([place])    [name], DR COOPER   Bradycardia    Exertional shortness of breath    Heart murmur    "I've had it for years; runs in the family on daddy's side" (01/21/2013)   HTN (hypertension)    Hyperlipemia 12/21/2012   Prostate cancer ([place])    "had 40 tx of radiation in  2009" (01/21/2013)   S/P aortic valve replacement with bioprosthetic valve 01/19/2013   Transcatheter Aortic Valve Replacement using 15m Sapien bioprosthetic tissue valve via transapical approach   Type II diabetes mellitus ([place]    Past Surgical History:  Procedure Laterality Date   BALLOON DILATION N/A 08/02/2020   Procedure: BALLOON DILATION;  Surgeon: [name] MD;  Location: [facility];  Service: Gastroenterology;  Laterality: N/A;   BIOPSY  10/03/2021   Procedure: BIOPSY;  Surgeon: [name] MD;  Location: [facility];  Service: Endoscopy;;   BIOPSY  12/14/2021   Procedure: BIOPSY;  Surgeon: [name] MD;  Location: [facility]  Service: Endoscopy;;   CARDIAC CATHETERIZATION  12/16/2012   CARDIAC VALVE REPLACEMENT  01/19/2013   AVR   CATARACT EXTRACTION W/ INTRAOCULAR LENS  IMPLANT, BILATERAL     ESOPHAGOGASTRODUODENOSCOPY N/A 10/03/2021   Procedure: ESOPHAGOGASTRODUODENOSCOPY (EGD);  Surgeon: [name] MD;  [facility];  Service: Endoscopy;  Laterality: N/A;   ESOPHAGOGASTRODUODENOSCOPY N/A 12/14/2021   Procedure: ESOPHAGOGASTRODUODENOSCOPY (EGD);  Surgeon: [name] MD;  [facility]  Service: Endoscopy;  Laterality: N/A;   ESOPHAGOGASTRODUODENOSCOPY (EGD) WITH PROPOFOL N/A 08/02/2020   Procedure: ESOPHAGOGASTRODUODENOSCOPY (EGD) WITH PROPOFOL;  Surgeon: [name] MD;  Location: [facility];  Service: Gastroenterology;  Laterality: N/A;   EYE SURGERY     INTRAOPERATIVE TRANSESOPHAGEAL ECHOCARDIOGRAM N/A 01/19/2013   Procedure: INTRAOPERATIVE TRANSESOPHAGEAL ECHOCARDIOGRAM;  Surgeon: CRexene Alberts MD;  Location:  [facility];  Service: Open Heart Surgery;  Laterality: N/A;   ORIF FOREARM FRACTURE Left 1954   "compound fx" (01/21/2013)   TRANSTHORACIC ECHOCARDIOGRAM  09/04/10, 09/07/08   Social History:  reports that he quit smoking about 46 years ago. His smoking use included cigarettes. He has a 44.00 pack-year smoking history. He has  never used smokeless tobacco. He reports that he does not use drugs. No history on file for alcohol use.  Allergies  Allergen Reactions   Amoxicillin Other (See Comments)    Burning of mouth, tongue and lips.  itchy, burning throat    Family History  Problem Relation Age of Onset   Diabetes Mother    Melanoma Mother 85   Bone cancer Mother 31   Pancreatic cancer Father 19   Heart disease Father    Stroke Sister    Cancer Brother 18       ? brain cancer    Prior to Admission medications   Medication Sig Start Date End Date Taking? Authorizing Provider  acetaminophen (TYLENOL) 650 MG CR tablet Take 650 mg by mouth daily before breakfast.    [provider]  Ascorbic Acid (VITAMIN C) 1000 MG tablet Take 1,000 mg by mouth daily.    [provider]  aspirin EC 81 MG tablet Take 1 tablet (81 mg total) by mouth daily. Swallow whole. 04/01/22   Medina-Vargas, Monina C, NP  cetirizine (ZYRTEC) 10 MG tablet Take 10 mg by mouth daily.    [provider]  cholecalciferol (VITAMIN D3) 25 MCG (1000 UNIT) tablet Take 1,000 Units by mouth 2 (two) times daily with a meal.    [provider]  dapagliflozin propanediol (FARXIGA) 10 MG TABS tablet Take 1 tablet (10 mg total) by mouth daily. 04/01/22   Medina-Vargas, Monina C, NP  ferrous sulfate 325 (65 FE) MG tablet Take 1 tablet (325 mg total) by mouth 2 (two) times daily with a meal. 04/01/22   Medina-Vargas, Monina C, NP  furosemide (LASIX) 40 MG tablet Take 2 tablets (80 mg total) by mouth 2 (two) times daily. 10/17/22   Chandrasekhar, Terisa Starr, MD  gabapentin (NEURONTIN) 100 MG capsule Take 1 capsule (100 mg total) by mouth at bedtime. 09/23/22   Patel, Donika K, DO  glipiZIDE (GLUCOTROL XL) 2.5 MG 24 hr tablet Take 2.5 mg by mouth every morning. 07/19/22   [provider]  KLOR-CON M20 20 MEQ tablet Take 40 mEq by mouth 3 (three) times a week. Jory Sims, Ludwig Clarks 08/21/22   [provider]  metFORMIN  (GLUCOPHAGE) 1000 MG tablet Take 1 tablet (1,000 mg total) by mouth 2 (two) times daily with breakfast and lunch. 04/01/22   Medina-Vargas, Monina C, NP  metoprolol succinate (TOPROL-XL) 25 MG 24 hr tablet Take 0.5 tablets (12.5 mg total) by mouth daily. 04/01/22 09/23/22  Medina-Vargas, Monina C, NP  OXYGEN Inhale 2 L into the lungs as needed (shortness of breath).    [provider]  pantoprazole (PROTONIX) 40 MG tablet Take 1 tablet (40 mg total) by mouth 2 (two) times daily. Take  1 tab of 40 mg tab  by mouth twice a day till 02/16/22 then 1 tab of 40 mg by mouth daily starting 02/17/22 04/01/22   Medina-Vargas, Monina C, NP  rosuvastatin (CRESTOR) 5 MG tablet TAKE 1 TABLET BY MOUTH ONCE DAILY AT  6PM 09/03/22   Elgie Collard, PA-C  Vitamin A 3 MG (10000 UT) TABS Take 1,000 mg by mouth daily at  6 (six) AM.    [provider]  XTANDI 40 MG capsule Take 160 mg by mouth daily. 09/13/22   [provider]    Physical Exam: Vitals:   10/18/22 1454 10/18/22 1817 10/19/22 0000  BP: 120/67 (!) 103/58 96/64  Pulse: 78 89 80  Resp: (!) '22 17 16  '$ Temp: (!) 97.5 F (36.4 C)  97.7 F (36.5 C)  TempSrc:   Oral  SpO2: 100% 100% 97%   Constitutional: NAD, calm, comfortable, elderly male laying at approximately 30 degree incline in bed Eyes: lids and conjunctivae normal ENMT: Mucous membranes are moist.  Neck: normal, supple Respiratory: clear to auscultation bilaterally, no wheezing, no crackles. Normal respiratory effort. No accessory muscle use.  Cardiovascular: Regular rate and rhythm, no murmurs / rubs / gallops. +3 pitting edema of bilateral LE distally.  Abdomen: no tenderness, Bowel sounds positive.  Musculoskeletal: no clubbing / cyanosis. No joint deformity upper and lower extremities. Good ROM, no contractures. Normal muscle tone.  Skin: no rashes, lesions, ulcers.  Neurologic: CN 2-12 grossly intact.  Strength 5/5 in all 4.  Psychiatric: Normal judgment and  insight. Alert and oriented x 3. Normal mood. Data Reviewed:  See HPI  Assessment and Plan: * Acute CHF (congestive heart failure) (HCC) -hx of severe MR/TR and AV stenosis s/p TAVR. Per cardiology, not a candidate for Mitral valve intervention -cardiology has evaluated and given '120mg'$  Lasix -cardiology recommends holding torsemide and metoprolol - Obtain TTE - Follow intake and output, daily weights -Consider adding spironolactone if potassium and creatinine are stable  Essential hypertension - becoming slightly hypotensive with SBP in 98 following IV Lasix. Monitor closely.  -Holding metoprolol for now while in active acute CHF exacerbation per cardiology  Type 2 diabetes mellitus with hyperlipidemia ([place]) -Controlled with last A1c of 5.8 - Hold home oral antidiabetic for now  Persistent atrial fibrillation ([place]) - Not on anticoagulation due to chronic anemia -Per chart review, not a candidate for Watchman device  Hyperkalemia -K of 5.2. Has received Lasix. Will follow repeat in the morning.  Elevated troponin -Troponin elevated to 189 in the setting of acute on chronic CHF. Suspect demand ischemia.   Acute kidney injury superimposed on chronic kidney disease ([place]) -suspect cardiorenal syndrome. Creatinine of 2.03 from prior normal of around 1.2. Monitor creatinine with diuresis.   Iron deficiency anemia due to chronic blood loss Hgb stable on presentation at 10.6 -follows with oncology outpatient and has received IV venofer routinely for the past month. Now only on oral iron supplementation.      Advance Care Planning: DNR  Consults: CARDIOLOGY   Family Communication: None at bedside  Severity of Illness: The appropriate patient status for this patient is INPATIENT. Inpatient status is judged to be reasonable and necessary in order to provide the required intensity of service to ensure the patient's safety. The patient's presenting symptoms, physical exam findings,  and initial radiographic and laboratory data in the context of their chronic comorbidities is felt to place them at high risk for further clinical deterioration. Furthermore, it is not anticipated that the patient will be medically stable for discharge from the hospital within 2 midnights of admission.   * I certify that at the point of admission it is my clinical judgment that the patient will require inpatient hospital care spanning beyond 2 midnights from the point of admission due to high intensity of service, high risk for further deterioration and high frequency of surveillance required.*  Author: Royal Piedra T  Maliah Pyles, DO 10/19/2022 12:33 AM  For on call review [email or web address].

## 2022-10-18 NOTE — ED Provider Triage Note (Signed)
Emergency Medicine Provider Triage Evaluation Note  [place] GROSSER , a 88 y.o. male  was evaluated in triage.  Pt complains of worsening shortness of breath started on Monday, history of CHF, was told by cardiologist to increase Lasix to 80 mg twice a day instead of 40 mg twice a day over the last few days but family does not endorse any increased urination, instead reporting that he is continue to gain at least 1 pound of weight today.  Review of Systems  Positive: Shortness of breath, weight gain Negative: Chest Pain, fever  Physical Exam  BP 120/67 (BP Location: Right Arm)   Pulse 78   Temp (!) 97.5 F (36.4 C)   Resp (!) 22   SpO2 100%  Gen:   Awake, no distress   Resp:  Normal effort  MSK:   Moves extremities without difficulty  Other:  No significant peripheral edema bilaterally, some crackles at lung bases  Medical Decision Making  Medically screening exam initiated at 3:16 PM.  Appropriate orders placed.  [name] was informed that the remainder of the evaluation will be completed by another provider, this initial triage assessment does not replace that evaluation, and the importance of remaining in the ED until their evaluation is complete.  Workup initiated   Anselmo Pickler, [place] 10/18/22 1519

## 2022-10-19 ENCOUNTER — Inpatient Hospital Stay (HOSPITAL_COMMUNITY): Payer: Medicare Other

## 2022-10-19 DIAGNOSIS — E875 Hyperkalemia: Secondary | ICD-10-CM

## 2022-10-19 DIAGNOSIS — I5031 Acute diastolic (congestive) heart failure: Secondary | ICD-10-CM | POA: Diagnosis not present

## 2022-10-19 DIAGNOSIS — I10 Essential (primary) hypertension: Secondary | ICD-10-CM | POA: Diagnosis not present

## 2022-10-19 DIAGNOSIS — N179 Acute kidney failure, unspecified: Secondary | ICD-10-CM | POA: Diagnosis not present

## 2022-10-19 DIAGNOSIS — I4819 Other persistent atrial fibrillation: Secondary | ICD-10-CM

## 2022-10-19 DIAGNOSIS — R7989 Other specified abnormal findings of blood chemistry: Secondary | ICD-10-CM | POA: Diagnosis not present

## 2022-10-19 DIAGNOSIS — L89152 Pressure ulcer of sacral region, stage 2: Secondary | ICD-10-CM | POA: Insufficient documentation

## 2022-10-19 LAB — GLUCOSE, CAPILLARY
Glucose-Capillary: 98 mg/dL (ref 70–99)
Glucose-Capillary: 186 mg/dL — ABNORMAL HIGH (ref 70–99)

## 2022-10-19 LAB — HEPATIC FUNCTION PANEL
ALT: 17 U/L (ref 0–44)
AST: 35 U/L (ref 15–41)
Albumin: 3.1 g/dL — ABNORMAL LOW (ref 3.5–5.0)
Alkaline Phosphatase: 193 U/L — ABNORMAL HIGH (ref 38–126)
Bilirubin, Direct: 0.5 mg/dL — ABNORMAL HIGH (ref 0.0–0.2)
Indirect Bilirubin: 0.9 mg/dL (ref 0.3–0.9)
Total Bilirubin: 1.4 mg/dL — ABNORMAL HIGH (ref 0.3–1.2)
Total Protein: 6.3 g/dL — ABNORMAL LOW (ref 6.5–8.1)

## 2022-10-19 LAB — BASIC METABOLIC PANEL
Anion gap: 12 (ref 5–15)
BUN: 64 mg/dL — ABNORMAL HIGH (ref 8–23)
CO2: 19 mmol/L — ABNORMAL LOW (ref 22–32)
Calcium: 9 mg/dL (ref 8.9–10.3)
Chloride: 107 mmol/L (ref 98–111)
Creatinine, Ser: 1.9 mg/dL — ABNORMAL HIGH (ref 0.61–1.24)
GFR, Estimated: 34 mL/min — ABNORMAL LOW (ref >60)
Glucose, Bld: 86 mg/dL (ref 70–99)
Potassium: 3.9 mmol/L (ref 3.5–5.1)
Sodium: 138 mmol/L (ref 135–145)

## 2022-10-19 LAB — ECHOCARDIOGRAM COMPLETE
AR max vel: 1.04 cm2
AV Area VTI: 1.03 cm2
AV Area mean vel: 0.95 cm2
AV Mean grad: 6.3 mmHg
AV Peak grad: 11.8 mmHg
Ao pk vel: 1.72 m/s
MV M vel: 4.49 m/s
MV Peak grad: 80.6 mmHg
S' Lateral: 3.6 cm

## 2022-10-19 LAB — CBG MONITORING, ED: Glucose-Capillary: 253 mg/dL — ABNORMAL HIGH (ref 70–99)

## 2022-10-19 LAB — MAGNESIUM: Magnesium: 2.2 mg/dL (ref 1.7–2.4)

## 2022-10-19 MED ORDER — INSULIN ASPART 100 UNIT/ML IJ SOLN
0.0000 [IU] | Freq: Three times a day (TID) | INTRAMUSCULAR | Status: DC
  Administered 2022-10-19: 5 [IU] via SUBCUTANEOUS
  Administered 2022-10-20 – 2022-10-21 (×2): 3 [IU] via SUBCUTANEOUS

## 2022-10-19 MED ORDER — FERROUS SULFATE 325 (65 FE) MG PO TABS
325.0000 mg | ORAL_TABLET | Freq: Two times a day (BID) | ORAL | Status: DC
  Administered 2022-10-19 – 2022-10-21 (×5): 325 mg via ORAL
  Filled 2022-10-19 (×5): qty 1

## 2022-10-19 MED ORDER — ROSUVASTATIN CALCIUM 5 MG PO TABS
5.0000 mg | ORAL_TABLET | Freq: Every day | ORAL | Status: DC
  Administered 2022-10-19 – 2022-10-21 (×3): 5 mg via ORAL
  Filled 2022-10-19 (×3): qty 1

## 2022-10-19 MED ORDER — PERFLUTREN LIPID MICROSPHERE: 1.0000 mL | INTRAVENOUS | Status: AC | PRN

## 2022-10-19 MED ORDER — INSULIN ASPART 100 UNIT/ML IJ SOLN: 0.0000 [IU] | Freq: Every day | INTRAMUSCULAR | Status: DC

## 2022-10-19 MED ORDER — POTASSIUM CHLORIDE CRYS ER 20 MEQ PO TBCR
20.0000 meq | EXTENDED_RELEASE_TABLET | Freq: Once | ORAL | Status: AC
  Administered 2022-10-19: 20 meq via ORAL
  Filled 2022-10-19: qty 1

## 2022-10-19 MED ORDER — FUROSEMIDE 10 MG/ML IJ SOLN
120.0000 mg | Freq: Once | INTRAVENOUS | Status: AC
  Administered 2022-10-19: 120 mg via INTRAVENOUS
  Filled 2022-10-19: qty 2

## 2022-10-19 MED ORDER — ACETAMINOPHEN 325 MG PO TABS
650.0000 mg | ORAL_TABLET | Freq: Four times a day (QID) | ORAL | Status: DC | PRN
  Administered 2022-10-19 – 2022-10-20 (×2): 650 mg via ORAL
  Filled 2022-10-19 (×2): qty 2

## 2022-10-19 MED ORDER — ENOXAPARIN SODIUM 40 MG/0.4ML IJ SOSY: 40.0000 mg | PREFILLED_SYRINGE | INTRAMUSCULAR | Status: DC

## 2022-10-19 MED ORDER — DAPAGLIFLOZIN PROPANEDIOL 10 MG PO TABS
10.0000 mg | ORAL_TABLET | Freq: Every day | ORAL | Status: DC
  Administered 2022-10-19 – 2022-10-21 (×3): 10 mg via ORAL
  Filled 2022-10-19 (×3): qty 1

## 2022-10-19 MED ORDER — PERFLUTREN LIPID MICROSPHERE
1.0000 mL | INTRAVENOUS | Status: AC | PRN
  Administered 2022-10-19: 3.5 mL via INTRAVENOUS

## 2022-10-19 MED ORDER — ENOXAPARIN SODIUM 30 MG/0.3ML IJ SOSY
30.0000 mg | PREFILLED_SYRINGE | Freq: Every day | INTRAMUSCULAR | Status: DC
  Administered 2022-10-19 – 2022-10-21 (×3): 30 mg via SUBCUTANEOUS
  Filled 2022-10-19 (×3): qty 0.3

## 2022-10-19 MED ORDER — FUROSEMIDE 10 MG/ML IJ SOLN
40.0000 mg | Freq: Two times a day (BID) | INTRAMUSCULAR | Status: AC
  Administered 2022-10-19 (×2): 40 mg via INTRAVENOUS
  Filled 2022-10-19 (×2): qty 4

## 2022-10-19 MED ORDER — ASPIRIN 81 MG PO TBEC
81.0000 mg | DELAYED_RELEASE_TABLET | Freq: Every day | ORAL | Status: DC
  Administered 2022-10-19 – 2022-10-21 (×3): 81 mg via ORAL
  Filled 2022-10-19 (×3): qty 1

## 2022-10-19 NOTE — Assessment & Plan Note (Addendum)
Heart rate controlled.  Not on anticoagulation due to chronic blood loss anemia.

## 2022-10-19 NOTE — Assessment & Plan Note (Addendum)
BP soft - Hold metoprolol

## 2022-10-19 NOTE — Assessment & Plan Note (Signed)
Present on admssion

## 2022-10-19 NOTE — Assessment & Plan Note (Addendum)
In the setting of mitral regurgitation, aortic valve stenosis status post TAVR.  Put out 700 with the first dose of Lasix.  Still looks fluid overloaded, gotten slightly better, potassium improved. - Continue Lasix - Follow-up echocardiogram - Hold metoprolol - Spironolactone per cardiology - Consult cardiology, appreciate recommendations

## 2022-10-19 NOTE — ED Notes (Signed)
Mepilex dressing applied to sacral wound.

## 2022-10-19 NOTE — Progress Notes (Signed)
Pt arrived to [facility] room 21 via bed. Report received from [employer]. Pt A&O x4, on room air, primofit in place, IV x1. Family at bedside. VSS. Pt given call bell and bed alarm turned on.

## 2022-10-19 NOTE — Assessment & Plan Note (Addendum)
No chest pain or EKG changes to suggest ischemia, ischemia ruled out, this is myocardial injury from CHF

## 2022-10-19 NOTE — ED Notes (Signed)
Pt moved from ER stretcher to Hospital bed. Room adjusted for comfort.

## 2022-10-19 NOTE — Assessment & Plan Note (Addendum)
Hemoglobin A1c 5.8% Glucose controlled - Continue Farxiga and SS corrections

## 2022-10-19 NOTE — Assessment & Plan Note (Addendum)
Resolved with Lasix

## 2022-10-19 NOTE — Assessment & Plan Note (Addendum)
Baseline creatinine 1.2-1.6.  Creatinine here 2.0, improved to 1.9 with diuresis, no improvement since.

## 2022-10-19 NOTE — Progress Notes (Signed)
  Progress Note   Patient: [name] JQB:[id number] DOB: [birth year] DOA: 10/18/2022     1 DOS: the patient was seen and examined on 10/19/2022        Brief hospital course: [name] is a 88 y.o. 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    12/15: Cr 2 from baseline 1.6, BNP elevated, CXR with CM, EKG in Afib.     Assessment and Plan: * Acute CHF (congestive heart failure) (HCC) In the setting of mitral regurgitation, aortic valve stenosis status post TAVR.  Put out 700 with the first dose of Lasix.  Still looks fluid overloaded, gotten slightly better, potassium improved. - Continue Lasix - Follow-up echocardiogram - Hold metoprolol - Spironolactone per cardiology - Consult cardiology, appreciate recommendations    Acute kidney injury superimposed on chronic kidney disease ([place]) Baseline creatinine 1.2-1.6.  Creatinine here 2.0, improved to 1.9 overnight with diuresis. - Daily BMP while on diuretics  Pressure injury of sacral region, stage 2 (HCC) Present on admssion   Persistent atrial fibrillation (HCC) Heart rate controlled.  Not on anticoagulation due to chronic blood loss anemia.  Hyperkalemia Resolved with Lasix  Elevated troponin No chest pain or EKG changes to suggest ischemia, ischemia ruled out, this is myocardial injury from CHF  Iron deficiency anemia due to chronic blood loss Receives IV iron as an outpatient.  Hemoglobin at baseline, no clinical bleeding - Continue oral iron  Essential hypertension BP soft - Hold metoprolol  Type 2 diabetes mellitus with hyperlipidemia (HCC) Hemoglobin A1c 5.8% -  Hold Farxiga and metformin - Sliding scale corrections  S/P aortic valve replacement with bioprosthetic valve            Subjective: Patient's body is sore from lying on the bed, he is still swollen.  He has no orthopnea, fever, confusion, chest pain, abdominal pain, nausea.     Physical Exam: BP 111/71   Pulse 87   Temp (!) 97.5 F (36.4 C) (Oral)   Resp (!) 21   SpO2 98%   Elderly adult male, appears uncomfortable, lying in bed, makes eye contact and is appropriately responding to questions Oropharynx moist, dentures in place, lips normal Heart rhythm irregular but normal rate, no murmurs.  He has 1+ lower extremity edema bilaterally and chronic venous stasis change Respiratory sounds diminished, no rales or wheezing appreciated Abdomen soft without tenderness palpation or guarding in all quadrants Attention somewhat distracted, affect blunted by pain, judgment and insight appear normal, oriented to person, place, and time, moves upper extremities with severe generalized weakness but symmetric strength    Data Reviewed: Basic metabolic panel shows slightly improved creatinine, improved potassium BNP is elevated at 1900 Troponin is low and flat INR is 1.5 likely due to liver disease Hemoglobin is 10  Family Communication: None present    Disposition: Status is: Inpatient The patient was admitted with swelling and dyspnea.  He will require ongoing IV Lasix, likely discharge to SNF in 1 to 2 days        Author: Edwin Dada, MD 10/19/2022 9:34 AM  For on call review [email or web address].

## 2022-10-19 NOTE — ED Notes (Signed)
Pt in room with family. Updated on plan of care. 2 person assist to bedside commode

## 2022-10-19 NOTE — Assessment & Plan Note (Addendum)
Receives IV iron as an outpatient.  Hemoglobin at baseline, no clinical bleeding - Continue oral iron

## 2022-10-19 NOTE — Progress Notes (Signed)
Progress Note  Patient Name: [name] Date of Encounter: 10/19/2022  Primary Cardiologist: [name], MD   Subjective   Overnight received 120 IV lasix with some response. Patient notes some improvement in SOB No CP.  No symptoms of atrial fibrillation.  Inpatient Medications    Scheduled Meds:  aspirin EC  81 mg Oral Daily   enoxaparin (LOVENOX) injection  30 mg Subcutaneous Daily   ferrous sulfate  325 mg Oral BID WC   furosemide  40 mg Intravenous BID   insulin aspart  0-5 Units Subcutaneous QHS   insulin aspart  0-9 Units Subcutaneous TID WC   rosuvastatin  5 mg Oral Daily   Continuous Infusions:  PRN Meds: perflutren lipid microspheres (DEFINITY) IV suspension   Vital Signs    Vitals:   10/19/22 0845 10/19/22 0900 10/19/22 0915 10/19/22 0930  BP:  100/86    Pulse: 90 91 85 87  Resp: 17 (!) '23 20 20  '$ Temp:      TempSrc:      SpO2: 98% 99% 97% 98%    Intake/Output Summary (Last 24 hours) at 10/19/2022 1127 Last data filed at 10/19/2022 0427 Gross per 24 hour  Intake 56.83 ml  Output 800 ml  Net -743.17 ml   There were no vitals filed for this visit.  Telemetry    A fib with frequent PVCs - Personally Reviewed  ECG    AF LVH with secondary repolarization - Personally Reviewed  Physical Exam   Gen: Thin and chronically ill appearing   Neck: Severe JVD Cardiac: No Rubs or Gallops, systolic Murmur, IRIR Respiratory: Clear to auscultation bilaterally, normal effort, normal  respiratory rate GI: Soft, nontender, non-distended  MS: No pitting  edema;  moves all extremities Integument: Chronic LE wounds have improved since outpatient Neuro:  At time of evaluation, alert and oriented to person/place/time/situation  Psych: Normal affect, patient feels ok   Labs    Chemistry Recent Labs  Lab 10/18/22 1541 10/19/22 0425 10/19/22 0615  NA 135 138  --   K 5.2* 3.9  --   CL 103 107  --   CO2 18* 19*  --   GLUCOSE 167*  86  --   BUN 68* 64*  --   CREATININE 2.03* 1.90*  --   CALCIUM 9.1 9.0  --   PROT  --   --  6.3*  ALBUMIN  --   --  3.1*  AST  --   --  35  ALT  --   --  17  ALKPHOS  --   --  193*  BILITOT  --   --  1.4*  GFRNONAA 31* 34*  --   ANIONGAP 14 12  --      Hematology Recent Labs  Lab 10/16/22 1526 10/18/22 1541  WBC 5.6 5.4  RBC 3.22* 3.32*  HGB 10.5* 10.6*  HCT 31.6* 32.8*  MCV 98.1 98.8  MCH 32.6 31.9  MCHC 33.2 32.3  RDW 17.4* 17.8*  PLT 168 158    Cardiac EnzymesNo results for input(s): "TROPONINI" in the last 168 hours. No results for input(s): "TROPIPOC" in the last 168 hours.   BNP Recent Labs  Lab 10/18/22 1541  BNP 1,918.3*     DDimer No results for input(s): "DDIMER" in the last 168 hours.   Radiology    ECHOCARDIOGRAM COMPLETE  Result Date: 10/19/2022    ECHOCARDIOGRAM REPORT   Patient Name:   GUSTAF MCCARTER Date  of Exam: 10/19/2022 Medical Rec #:  [id number]         Height:       68.5 in Accession #:    [id number]        Weight:       154.2 lb Date of Birth:  Jan 23, 1933          BSA:          1.840 m Patient Age:    80 years          BP:           111/71 mmHg Patient Gender: M                 HR:           88 bpm. Exam Location:  Inpatient Procedure: 2D Echo Indications:    acute diastolic chf  History:        Patient has prior history of Echocardiogram examinations, most                 recent 03/02/2022. Chronic kidney disease, Arrythmias:Atrial                 Fibrillation, Signs/Symptoms:Dyspnea; Risk Factors:Diabetes and                 Dyslipidemia.                 Aortic Valve: 26 mm Sapien bioprosthetic valve is present in the                 aortic position.  Sonographer:    Johny Chess RDCS Referring Phys: [id number] [place]  1. Left ventricular ejection fraction, by estimation, is 45 to 50%. The left ventricle has mildly decreased function. The left ventricle demonstrates global hypokinesis. Left ventricular diastolic parameters  are indeterminate. There is the interventricular septum is flattened in systole and diastole, consistent with right ventricular pressure and volume overload.  2. Right ventricular systolic function is mildly reduced. The right ventricular size is moderately enlarged. Tricuspid regurgitation signal is inadequate for assessing PA pressure. The estimated right ventricular systolic pressure is 85.0 mmHg.  3. Left atrial size was severely dilated.  4. Right atrial size was severely dilated.  5. The mitral valve is grossly normal. Moderate mitral valve regurgitation. Moderate mitral annular calcification.  6. Tricuspid valve regurgitation is severe.  7. The aortic valve has been repaired/replaced. Aortic valve regurgitation is mild. There is a 26 mm Sapien bioprosthetic valve present in the aortic position. Aortic valve mean gradient measures 6.2 mmHg. Mild PVL unchanged from prior.  8. The inferior vena cava is dilated in size with <50% respiratory variability, suggesting right atrial pressure of 15 mmHg. Comparison(s): Prior images reviewed side by side. LVEF is decreased from prior (this study uses echo contrast). MR has improved. FINDINGS  Left Ventricle: Left ventricular ejection fraction, by estimation, is 45 to 50%. The left ventricle has mildly decreased function. The left ventricle demonstrates global hypokinesis. The left ventricular internal cavity size was normal in size. There is  no left ventricular hypertrophy. The interventricular septum is flattened in systole and diastole, consistent with right ventricular pressure and volume overload. Left ventricular diastolic parameters are indeterminate. Right Ventricle: The right ventricular size is moderately enlarged. No increase in right ventricular wall thickness. Right ventricular systolic function is mildly reduced. Tricuspid regurgitation signal is inadequate for assessing PA pressure. The tricuspid regurgitant velocity is 2.56 m/s, and with an assumed right  atrial  pressure of 15 mmHg, the estimated right ventricular systolic pressure is 33.3 mmHg. Left Atrium: Left atrial size was severely dilated. Right Atrium: Right atrial size was severely dilated. Pericardium: Trivial pericardial effusion is present. The pericardial effusion is anterior to the right ventricle. Mitral Valve: The mitral valve is grossly normal. Moderate mitral annular calcification. Moderate mitral valve regurgitation. Tricuspid Valve: The tricuspid valve is normal in structure. Tricuspid valve regurgitation is severe. No evidence of tricuspid stenosis. Aortic Valve: The aortic valve has been repaired/replaced. Aortic valve regurgitation is mild. Aortic valve mean gradient measures 6.2 mmHg. Aortic valve peak gradient measures 11.8 mmHg. Aortic valve area, by VTI measures 1.03 cm. There is a 26 mm Sapien bioprosthetic valve present in the aortic position. Pulmonic Valve: The pulmonic valve was normal in structure. Pulmonic valve regurgitation is not visualized. No evidence of pulmonic stenosis. Aorta: The aortic root and ascending aorta are structurally normal, with no evidence of dilitation. Venous: The inferior vena cava is dilated in size with less than 50% respiratory variability, suggesting right atrial pressure of 15 mmHg. IAS/Shunts: No atrial level shunt detected by color flow Doppler.  LEFT VENTRICLE PLAX 2D LVIDd:         4.80 cm LVIDs:         3.60 cm LV PW:         1.00 cm LV IVS:        1.00 cm LVOT diam:     1.70 cm LV SV:         25 LV SV Index:   14 LVOT Area:     2.27 cm  RIGHT VENTRICLE          IVC RV Basal diam:  3.50 cm  IVC diam: 2.70 cm TAPSE (M-mode): 1.3 cm LEFT ATRIUM              Index        RIGHT ATRIUM           Index LA diam:        4.80 cm  2.61 cm/m   RA Area:     28.30 cm LA Vol (A2C):   109.0 ml 59.24 ml/m  RA Volume:   89.20 ml  48.48 ml/m LA Vol (A4C):   80.4 ml  43.70 ml/m LA Biplane Vol: 94.7 ml  51.47 ml/m  AORTIC VALVE AV Area (Vmax):    1.04 cm AV  Area (Vmean):   0.95 cm AV Area (VTI):     1.03 cm AV Vmax:           172.00 cm/s AV Vmean:          113.250 cm/s AV VTI:            0.242 m AV Peak Grad:      11.8 mmHg AV Mean Grad:      6.2 mmHg LVOT Vmax:         79.00 cm/s LVOT Vmean:        47.175 cm/s LVOT VTI:          0.110 m LVOT/AV VTI ratio: 0.46  AORTA Ao Asc diam: 2.90 cm MR Peak grad: 80.6 mmHg   TRICUSPID VALVE MR Mean grad: 51.0 mmHg   TR Peak grad:   26.2 mmHg MR Vmax:      449.00 cm/s TR Vmax:        256.00 cm/s MR Vmean:     341.0 cm/s  SHUNTS                           Systemic VTI:  0.11 m                           Systemic Diam: 1.70 cm Rudean Haskell MD Electronically signed by Rudean Haskell MD Signature Date/Time: 10/19/2022/11:26:57 AM    Final    DG Chest 2 View  Result Date: 10/18/2022 CLINICAL DATA:  Shortness of breath. EXAM: CHEST - 2 VIEW COMPARISON:  March 01, 2022. FINDINGS: Stable cardiomegaly. Both lungs are clear. The visualized skeletal structures are unremarkable. IMPRESSION: No active cardiopulmonary disease. Electronically Signed   By: Marijo Conception M.D.   On: 10/18/2022 16:12    Cardiac Studies   Cardiac Studies & Procedures       ECHOCARDIOGRAM  ECHOCARDIOGRAM COMPLETE 10/19/2022  Narrative ECHOCARDIOGRAM REPORT    Patient Name:   RJ PEDROSA Date of Exam: 10/19/2022 Medical Rec #:  [id number]         Height:       68.5 in Accession #:    [id number]        Weight:       154.2 lb Date of Birth:  Jun 15, 1933          BSA:          1.840 m Patient Age:    14 years          BP:           111/71 mmHg Patient Gender: M                 HR:           88 bpm. Exam Location:  Inpatient  Procedure: 2D Echo  Indications:    acute diastolic chf  History:        Patient has prior history of Echocardiogram examinations, most recent 03/02/2022. Chronic kidney disease, Arrythmias:Atrial Fibrillation, Signs/Symptoms:Dyspnea; Risk Factors:Diabetes  and Dyslipidemia. Aortic Valve: 26 mm Sapien bioprosthetic valve is present in the aortic position.  Sonographer:    Johny Chess RDCS Referring Phys: [id number] [place]   1. Left ventricular ejection fraction, by estimation, is 45 to 50%. The left ventricle has mildly decreased function. The left ventricle demonstrates global hypokinesis. Left ventricular diastolic parameters are indeterminate. There is the interventricular septum is flattened in systole and diastole, consistent with right ventricular pressure and volume overload. 2. Right ventricular systolic function is mildly reduced. The right ventricular size is moderately enlarged. Tricuspid regurgitation signal is inadequate for assessing PA pressure. The estimated right ventricular systolic pressure is 98.3 mmHg. 3. Left atrial size was severely dilated. 4. Right atrial size was severely dilated. 5. The mitral valve is grossly normal. Moderate mitral valve regurgitation. Moderate mitral annular calcification. 6. Tricuspid valve regurgitation is severe. 7. The aortic valve has been repaired/replaced. Aortic valve regurgitation is mild. There is a 26 mm Sapien bioprosthetic valve present in the aortic position. Aortic valve mean gradient measures 6.2 mmHg. Mild PVL unchanged from prior. 8. The inferior vena cava is dilated in size with <50% respiratory variability, suggesting right atrial pressure of 15 mmHg.  Comparison(s): Prior images reviewed side by side. LVEF is decreased from prior (this study uses echo contrast). MR has improved.  FINDINGS Left Ventricle: Left ventricular ejection fraction, by estimation, is 45 to 50%. The left ventricle has mildly  decreased function. The left ventricle demonstrates global hypokinesis. The left ventricular internal cavity size was normal in size. There is no left ventricular hypertrophy. The interventricular septum is flattened in systole and diastole, consistent with  right ventricular pressure and volume overload. Left ventricular diastolic parameters are indeterminate.  Right Ventricle: The right ventricular size is moderately enlarged. No increase in right ventricular wall thickness. Right ventricular systolic function is mildly reduced. Tricuspid regurgitation signal is inadequate for assessing PA pressure. The tricuspid regurgitant velocity is 2.56 m/s, and with an assumed right atrial pressure of 15 mmHg, the estimated right ventricular systolic pressure is 28.4 mmHg.  Left Atrium: Left atrial size was severely dilated.  Right Atrium: Right atrial size was severely dilated.  Pericardium: Trivial pericardial effusion is present. The pericardial effusion is anterior to the right ventricle.  Mitral Valve: The mitral valve is grossly normal. Moderate mitral annular calcification. Moderate mitral valve regurgitation.  Tricuspid Valve: The tricuspid valve is normal in structure. Tricuspid valve regurgitation is severe. No evidence of tricuspid stenosis.  Aortic Valve: The aortic valve has been repaired/replaced. Aortic valve regurgitation is mild. Aortic valve mean gradient measures 6.2 mmHg. Aortic valve peak gradient measures 11.8 mmHg. Aortic valve area, by VTI measures 1.03 cm. There is a 26 mm Sapien bioprosthetic valve present in the aortic position.  Pulmonic Valve: The pulmonic valve was normal in structure. Pulmonic valve regurgitation is not visualized. No evidence of pulmonic stenosis.  Aorta: The aortic root and ascending aorta are structurally normal, with no evidence of dilitation.  Venous: The inferior vena cava is dilated in size with less than 50% respiratory variability, suggesting right atrial pressure of 15 mmHg.  IAS/Shunts: No atrial level shunt detected by color flow Doppler.   LEFT VENTRICLE PLAX 2D LVIDd:         4.80 cm LVIDs:         3.60 cm LV PW:         1.00 cm LV IVS:        1.00 cm LVOT diam:     1.70 cm LV SV:          25 LV SV Index:   14 LVOT Area:     2.27 cm   RIGHT VENTRICLE          IVC RV Basal diam:  3.50 cm  IVC diam: 2.70 cm TAPSE (M-mode): 1.3 cm  LEFT ATRIUM              Index        RIGHT ATRIUM           Index LA diam:        4.80 cm  2.61 cm/m   RA Area:     28.30 cm LA Vol (A2C):   109.0 ml 59.24 ml/m  RA Volume:   89.20 ml  48.48 ml/m LA Vol (A4C):   80.4 ml  43.70 ml/m LA Biplane Vol: 94.7 ml  51.47 ml/m AORTIC VALVE AV Area (Vmax):    1.04 cm AV Area (Vmean):   0.95 cm AV Area (VTI):     1.03 cm AV Vmax:           172.00 cm/s AV Vmean:          113.250 cm/s AV VTI:            0.242 m AV Peak Grad:      11.8 mmHg AV Mean Grad:      6.2 mmHg LVOT Vmax:  79.00 cm/s LVOT Vmean:        47.175 cm/s LVOT VTI:          0.110 m LVOT/AV VTI ratio: 0.46  AORTA Ao Asc diam: 2.90 cm  MR Peak grad: 80.6 mmHg   TRICUSPID VALVE MR Mean grad: 51.0 mmHg   TR Peak grad:   26.2 mmHg MR Vmax:      449.00 cm/s TR Vmax:        256.00 cm/s MR Vmean:     341.0 cm/s SHUNTS Systemic VTI:  0.11 m Systemic Diam: 1.70 cm  Rudean Haskell MD Electronically signed by Rudean Haskell MD Signature Date/Time: 10/19/2022/11:26:57 AM    Final     CT SCANS  CT CORONARY MORPH W/CTA COR W/SCORE 12/30/2012  Narrative *RADIOLOGY REPORT*  INDICATION: 88 year old male with history of severe aortic stenosis.  Preprocedural study for possible transcatheter aortic valve replacement (TAVR) procedure.  CT ANGIOGRAPHY OF THE HEART, CORONARY ARTERY, STRUCTURE, AND MORPHOLOGY  COMPARISON:  No priors.  CONTRAST: 42m OMNIPAQUE IOHEXOL 350 MG/ML SOLN  TECHNIQUE:  CT angiography of the coronary vessels was performed on a 256 channel system using prospective ECG gating.  A scout and ECG- gated noncontrast exam (for calcium scoring) were performed. Appropriate delay was determined by bolus tracking after injection of iodinated contrast, and an ECG-gated coronary CTA was  performed with sub-mm slice collimation throughout the cardiac cycle. Imaging post processing was performed on an independent workstation creating multiplanar and 3-D images, allowing for quantitative analysis of the heart and coronary arteries.  Note that this exam targets the heart and the chest was not imaged in its entirety.  PREMEDICATION: None.  FINDINGS: Technical quality: Good. Heart rate:  55-68 bpm (patient was in atrial fibrillation).  CORONARY ARTERIES: Assessment not performed secondary to the excessive imaging noise, small amount of motion related artifact (related to irregular rhythm) and high degree of coronary artery calcification.  The patient has had a recent catheter angiogram as well.  Aortic Root Measurements Pertinent to Potential TAVR Procedure:  Aortic Valve Description:  The aortic valve was tricuspid with severe thickening and calcification of all of the cusps.  During systole, there was limited valve opening with an estimated aortic valve area of approximately 0.84 cm2 by planimetry.  Aortic Valve Area: 0.84 cm-sq  Annulus: Planimetry - 5.6 cm2 Long & Short Axis - 31.0 x 25.2 mm Circumference - 87.3 mm  Sinuses of Valsalva: R SOV -     width 34.1 mm height 26.8 mm L SOV -     width 33.5 mm  height 20.3 mm [place]ing SOV -    width 33.6 mm height 21.0 mm  Coronary Artery [place]: L main - 15.2 mm from the annulus RCA - 19.8 mm from the annulus  Ascending Aorta: 33.1 x 32.0 mm in diameter 4 cm above the annulus.  Potential Angiographic Angles: LAO 11 degrees, Caudal 7 degrees RAO 10 degrees, Caudal 28 degrees  OTHER AORTA AND PULMONARY MEASUREMENTS: Descending aorta:  (< 40 mm): 31 mm Main pulmonary artery: (< 30 mm): 28 mm  OTHER FINDINGS: Within the visualized portions of the thorax there are no suspicious appearing pulmonary nodules, areas of airspace consolidation or pleural effusions.  Mild diffuse bronchial wall thickening and some  mild paraseptal emphysematous changes are noted. There is no significant pericardial fluid, thickening or pericardial calcification.  Numerous borderline enlarged and mildly enlarged mediastinal lymph nodes are noted, largest of which measures up to 1.3 cm in short axis  in the subcarinal station. There are no aggressive appearing lytic or blastic lesions noted in the visualized portions of the skeleton.  IMPRESSION: 1.  Severely sclerotic aortic valve with an estimated aortic valve area of 0.84 cm2 by planimetry.  Findings and measurements pertinent to potential TAVR procedure, as detailed above. 2. Numerous borderline enlarged and mildly enlarged mediastinal lymph nodes are nonspecific.  Similar findings were seen in the abdomen and pelvis (see that report for discussion and details). Clinical correlation is recommended.  Another differential consideration would include an underlying lymphoproliferative disorder. 3.  Mild diffuse bronchial wall thickening with mild paraseptal emphysematous changes.   Original Report Authenticated By: Vinnie Langton, M.D.           Patient Profile     88 y.o. 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/p TAVR #AKI on CKD Likely Cardiorenal - patient has had structural evaluationat and patient is not a candidate for mitral or tricuspid valve structural interventions - holding BB unless AF RVR - given 40 IV lasix; I suspect we will increase this over time -Maintain K >4, Mg>2 - returning home [place] - at last outpatient evaluation had [place] discussion.  DNR but no other changes in care.  Will attempt to diurese patient to as near euvolemia as we can without needed HD, as he would not be a candidate  I have re-iterated my long term concerns with patient and with daugther Sherrie.  He is fatigue and  I'm unclear of his full goals.  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  -Continue ASA 81 mg daily -Continue Crestor 5 mg daily - no ischemic evaluation planned   #Permanent Afib - he had failed AC due to anemia; on ASA not a Watchman Candidate -Holding metoprolol as described above with plans to restart when able -Maintain telemetry   #HTN #HLD -continue crestor    #PAD -ASA and statin   #Chronic Anemia -Recent iron studies checked on 12/13 without IDA.     For questions or updates, please contact [facility] Please consult [email or web address] for contact info under Cardiology/STEMI.      Rudean Haskell, MD [address] [place], [place] [address] [phone]  11:27 AM

## 2022-10-19 NOTE — Hospital Course (Signed)
[name] is a 88 y.o. 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    12/15: Cr 2 from baseline 1.6, BNP elevated, CXR with CM, EKG in Afib.

## 2022-10-19 NOTE — Progress Notes (Signed)
  Echocardiogram 2D Echocardiogram has been performed.  [name] 10/19/2022, 10:28 AM

## 2022-10-20 DIAGNOSIS — I5033 Acute on chronic diastolic (congestive) heart failure: Secondary | ICD-10-CM

## 2022-10-20 DIAGNOSIS — D500 Iron deficiency anemia secondary to blood loss (chronic): Secondary | ICD-10-CM | POA: Diagnosis not present

## 2022-10-20 DIAGNOSIS — N179 Acute kidney failure, unspecified: Secondary | ICD-10-CM | POA: Diagnosis not present

## 2022-10-20 DIAGNOSIS — R7989 Other specified abnormal findings of blood chemistry: Secondary | ICD-10-CM | POA: Diagnosis not present

## 2022-10-20 DIAGNOSIS — I071 Rheumatic tricuspid insufficiency: Secondary | ICD-10-CM | POA: Insufficient documentation

## 2022-10-20 DIAGNOSIS — E43 Unspecified severe protein-calorie malnutrition: Secondary | ICD-10-CM

## 2022-10-20 DIAGNOSIS — I272 Pulmonary hypertension, unspecified: Secondary | ICD-10-CM | POA: Insufficient documentation

## 2022-10-20 LAB — GLUCOSE, CAPILLARY
Glucose-Capillary: 116 mg/dL — ABNORMAL HIGH (ref 70–99)
Glucose-Capillary: 258 mg/dL — ABNORMAL HIGH (ref 70–99)
Glucose-Capillary: 215 mg/dL — ABNORMAL HIGH (ref 70–99)
Glucose-Capillary: 142 mg/dL — ABNORMAL HIGH (ref 70–99)

## 2022-10-20 LAB — BASIC METABOLIC PANEL
Anion gap: 13 (ref 5–15)
BUN: 65 mg/dL — ABNORMAL HIGH (ref 8–23)
CO2: 20 mmol/L — ABNORMAL LOW (ref 22–32)
Calcium: 8.8 mg/dL — ABNORMAL LOW (ref 8.9–10.3)
Chloride: 102 mmol/L (ref 98–111)
Creatinine, Ser: 1.88 mg/dL — ABNORMAL HIGH (ref 0.61–1.24)
GFR, Estimated: 34 mL/min — ABNORMAL LOW (ref >60)
Glucose, Bld: 126 mg/dL — ABNORMAL HIGH (ref 70–99)
Potassium: 4 mmol/L (ref 3.5–5.1)
Sodium: 135 mmol/L (ref 135–145)

## 2022-10-20 LAB — CBC
HCT: 31.9 % — ABNORMAL LOW (ref 39.0–52.0)
Hemoglobin: 10.8 g/dL — ABNORMAL LOW (ref 13.0–17.0)
MCH: 32.2 pg (ref 26.0–34.0)
MCHC: 33.9 g/dL (ref 30.0–36.0)
MCV: 95.2 fL (ref 80.0–100.0)
Platelets: 155 10*3/uL (ref 150–400)
RBC: 3.35 MIL/uL — ABNORMAL LOW (ref 4.22–5.81)
RDW: 18.2 % — ABNORMAL HIGH (ref 11.5–15.5)
WBC: 5.4 10*3/uL (ref 4.0–10.5)
nRBC: 0.7 % — ABNORMAL HIGH (ref 0.0–0.2)

## 2022-10-20 MED ORDER — FUROSEMIDE 40 MG PO TABS
80.0000 mg | ORAL_TABLET | Freq: Two times a day (BID) | ORAL | Status: DC
  Administered 2022-10-20 – 2022-10-21 (×3): 80 mg via ORAL
  Filled 2022-10-20 (×3): qty 2

## 2022-10-20 MED ORDER — FUROSEMIDE 40 MG PO TABS: 80.0000 mg | ORAL_TABLET | Freq: Two times a day (BID) | ORAL | Status: DC

## 2022-10-20 MED ORDER — FUROSEMIDE 10 MG/ML IJ SOLN
60.0000 mg | Freq: Two times a day (BID) | INTRAMUSCULAR | Status: DC
  Filled 2022-10-20: qty 6

## 2022-10-20 NOTE — Plan of Care (Signed)
IV lasix switched to PO lasix. Pt stood at bedside and performed bending knee exercises.  Problem: Education: Goal: Ability to demonstrate management of disease process will improve Outcome: Progressing Goal: Ability to verbalize understanding of medication therapies will improve Outcome: Progressing Goal: Individualized Educational Video(s) Outcome: Progressing   Problem: Activity: Goal: Capacity to carry out activities will improve Outcome: Progressing   Problem: Cardiac: Goal: Ability to achieve and maintain adequate cardiopulmonary perfusion will improve Outcome: Progressing   Problem: Education: Goal: Ability to describe self-care measures that may prevent or decrease complications (Diabetes Survival Skills Education) will improve Outcome: Progressing Goal: Individualized Educational Video(s) Outcome: Progressing   Problem: Coping: Goal: Ability to adjust to condition or change in health will improve Outcome: Progressing   Problem: Fluid Volume: Goal: Ability to maintain a balanced intake and output will improve Outcome: Progressing   Problem: Health Behavior/Discharge Planning: Goal: Ability to identify and utilize available resources and services will improve Outcome: Progressing Goal: Ability to manage health-related needs will improve Outcome: Progressing   Problem: Metabolic: Goal: Ability to maintain appropriate glucose levels will improve Outcome: Progressing   Problem: Nutritional: Goal: Maintenance of adequate nutrition will improve Outcome: Progressing Goal: Progress toward achieving an optimal weight will improve Outcome: Progressing   Problem: Skin Integrity: Goal: Risk for impaired skin integrity will decrease Outcome: Progressing   Problem: Tissue Perfusion: Goal: Adequacy of tissue perfusion will improve Outcome: Progressing   Problem: Education: Goal: Knowledge of General Education information will improve Description: Including pain  rating scale, medication(s)/side effects and non-pharmacologic comfort measures Outcome: Progressing   Problem: Health Behavior/Discharge Planning: Goal: Ability to manage health-related needs will improve Outcome: Progressing   Problem: Clinical Measurements: Goal: Ability to maintain clinical measurements within normal limits will improve Outcome: Progressing Goal: Will remain free from infection Outcome: Progressing Goal: Diagnostic test results will improve Outcome: Progressing Goal: Respiratory complications will improve Outcome: Progressing Goal: Cardiovascular complication will be avoided Outcome: Progressing   Problem: Activity: Goal: Risk for activity intolerance will decrease Outcome: Progressing   Problem: Nutrition: Goal: Adequate nutrition will be maintained Outcome: Progressing   Problem: Coping: Goal: Level of anxiety will decrease Outcome: Progressing   Problem: Elimination: Goal: Will not experience complications related to bowel motility Outcome: Progressing Goal: Will not experience complications related to urinary retention Outcome: Progressing   Problem: Pain Managment: Goal: General experience of comfort will improve Outcome: Progressing   Problem: Safety: Goal: Ability to remain free from injury will improve Outcome: Progressing   Problem: Skin Integrity: Goal: Risk for impaired skin integrity will decrease Outcome: Progressing

## 2022-10-20 NOTE — Assessment & Plan Note (Addendum)
As events by advanced heart failure and renal failure, as well as severe loss of subcutaneous muscle mass and fat diffusely - Consult dietitian

## 2022-10-20 NOTE — Progress Notes (Signed)
Progress Note   Patient: [name]:[id number] DOB: Jul 21, 1933 DOA: 10/18/2022     2 DOS: the patient was seen and examined on 10/20/2022 at 9:44AM      Brief hospital course: [name] is a 88 y.o. 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    12/15: Cr 2 from baseline 1.6, BNP elevated, CXR with CM, EKG in Afib.     Assessment and Plan: * Acute on chronic combined systolic and diastolic CHF (congestive heart failure) (HCC) In the setting of mitral regurgitation, aortic valve stenosis status post TAVR.  Net -1.1 L, weight down to 67.3 kg, creatinine stable, no change  Repeat echo shows EF down to 45-50%, pulmonary hypertension worse.  TR worse.  Patient discussed with Cardiology, he refuses some recommended treatments.  In the setting of his malnutrition, limited therapeutic options and worsening functional status/failure to thrive, this is very reasonable.  - Transition back to oral Lasix - Continue Farxiga    Acute kidney injury superimposed on chronic kidney disease (HCC) Baseline creatinine 1.2-1.6.  Creatinine here 2.0, improved to 1.9 with diuresis, no improvement since.  Tricuspid regurgitation See above  Pulmonary hypertension ([place]) See above  Pressure injury of sacral region, stage 2 (HCC) Present on admssion   Persistent atrial fibrillation (HCC) Heart rate controlled.  Not on anticoagulation due to chronic blood loss anemia.  Hyperkalemia Resolved with Lasix  Elevated troponin No chest pain or EKG changes to suggest ischemia, ischemia ruled out, this is myocardial injury from CHF  Iron deficiency  anemia due to chronic blood loss Receives IV iron as an outpatient.  Hemoglobin at baseline, no clinical bleeding - Continue oral iron  Protein-calorie malnutrition, severe As events by advanced heart failure and renal failure, as well as severe loss of subcutaneous muscle mass and fat diffusely - Consult dietitian  Essential hypertension BP soft - Hold metoprolol  Type 2 diabetes mellitus with hyperlipidemia (HCC) Hemoglobin A1c 5.8% Glucose controlled - Continue Farxiga and SS corrections  S/P aortic valve replacement with bioprosthetic valve See above            Subjective: Patient has some back pain, soreness in his shoulder, but otherwise no significant dyspnea, swelling is improved, has generalized malaise only.     Physical Exam: BP (!) 109/59 (BP Location: Right Arm)   Pulse 87   Temp 97.8 F (36.6 C) (Axillary)   Resp 16   Ht 5' 8.5" (1.74 m)   Wt 67.3 kg   SpO2 100%   BMI 22.23 kg/m   Elderly adult male, appears frail and weak RRR, did not appreciate murmurs, his lower extremity edema is gone, the JVP is elevated Respiratory rate seems normal, lungs clear but diminished throughout Abdomen soft, no significant grimace to palpation Attention normal, affect blunted by pain, judgment insight appear relatively normal he is oriented to person, place, time, severe generalized weakness is noted    Data Reviewed: Discussed with cardiology Glucose normal CBC shows hemoglobin 10.8, no change Basic metabolic panel shows creatinine 1.88, no change  Family Communication: Daughter by phone   Disposition: Status is: Inpatient Recommended patient be evaluated for skilled nursing facility, but patient refuses.  We  will transition to oral diuretics today, observe overnight and likely discharge home with home health tomorrow       Author: Edwin Dada, MD 10/20/2022 2:17 PM  For on call review [email or web address].

## 2022-10-20 NOTE — TOC Initial Note (Addendum)
Transition of Care [facility]) - Initial/Assessment Note    Patient Details  Name: [name] MRN: [id number] Date of Birth: 07-31-1933  Transition of Care [facility] CM/SW Contact:    Zenon Mayo, RN Phone Number: 10/20/2022, 11:49 AM  Clinical Narrative:                 NCM spoke with patient at bedside, he states he lives alone, he has a walker at home and two wheelchairs.  He states his daughter lives close by and checks on him a lot.  Daughter takes him to [facility].  Will await pt eval. NCM offered choice for [facility] agency in case they say he needs [facility] , he  states to call his daughter Dondra Spry to ask her about the [facility].  NCM contacted [place], she states she would like [place].  Also she states she goes to her father 's house about two to three times a day, she takes him to see his wife who is in [place] as well.  NCM made referral to [facility] with Alvis Lemmings he states he will be able to take referral, just awaiting Physical therapy recs.   Expected Discharge Plan: [place] Barriers to Discharge: Continued Medical Work up   Patient Goals and CMS Choice Patient states their goals for this hospitalization and ongoing recovery are:: return home CMS Medicare.gov Compare Post Acute Care list provided to:: Patient Choice offered to / list presented to : Patient  Expected Discharge Plan and Services Expected Discharge Plan: [place] In-house Referral: NA Discharge Planning Services: CM Consult Post Acute Care Choice: [place] arrangements for the past 2 months: Single Family Home                 DME Arranged: N/A DME Agency: NA       HH Arranged: NA          Prior Living Arrangements/Services Living arrangements for the past 2 months: Single Family Home Lives with:: Self Patient language and need for interpreter reviewed:: Yes Do you feel safe going back to the place where you live?: Yes      Need for Family Participation in Patient  Care: Yes (Comment) Care giver support system in place?: Yes (comment) Current home services: DME (a walker and two wheelchairs) Criminal Activity/Legal Involvement Pertinent to Current Situation/Hospitalization: No - Comment as needed  Activities of Daily Living Home Assistive Devices/Equipment: Wheelchair, Shower chair with back, Environmental consultant (specify type) ADL Screening (condition at time of admission) Patient's cognitive ability adequate to safely complete daily activities?: Yes Is the patient deaf or have difficulty hearing?: Yes Does the patient have difficulty seeing, even when wearing glasses/contacts?: Yes Does the patient have difficulty concentrating, remembering, or making decisions?: No Patient able to express need for assistance with ADLs?: Yes Does the patient have difficulty dressing or bathing?: No Independently performs ADLs?: No Communication: Independent Dressing (OT): Needs assistance Is this a change from baseline?: Change from baseline, expected to last <3days Grooming: Needs assistance Is this a change from baseline?: Change from baseline, expected to last <3 days Feeding: Independent Bathing: Needs assistance Is this a change from baseline?: Change from baseline, expected to last <3 days Toileting: Needs assistance Is this a change from baseline?: Change from baseline, expected to last <3 days In/Out Bed: Needs assistance Is this a change from baseline?: Change from baseline, expected to last <3 days Walks in Home: Independent with device (comment) Does the patient  have difficulty walking or climbing stairs?: Yes Weakness of Legs: Both Weakness of Arms/Hands: Both  Permission Sought/Granted                  Emotional Assessment Appearance:: Appears stated age Attitude/Demeanor/Rapport: Engaged Affect (typically observed): Appropriate Orientation: : Oriented to Self, Oriented to Situation, Oriented to  Time, Oriented to Place Alcohol / Substance Use: Not  Applicable Psych Involvement: No (comment)  Admission diagnosis:  Dehydration [E86.0] Elevated troponin [R79.89] Failure to thrive in adult [R62.7] Acute CHF (congestive heart failure) (HCC) [I50.9] AKI (acute kidney injury) ([place]) [N17.9] Patient Active Problem List   Diagnosis Date Noted   Acute kidney injury superimposed on chronic kidney disease ([place]) 10/19/2022   Elevated troponin 10/19/2022   Hyperkalemia 10/19/2022   Persistent atrial fibrillation ([place]) 10/19/2022   Pressure injury of sacral region, stage 2 ([place]) 10/19/2022   Acute on chronic diastolic CHF (congestive heart failure) ([place]) 10/18/2022   Wounds, multiple 10/04/2022   Iron deficiency anemia due to chronic blood loss 08/13/2022   Macular degeneration (senile) of retina 03/27/2022   CKD (chronic kidney disease) stage 2, GFR 60-89 ml/min 03/06/2022   Protein-calorie malnutrition, severe 16/96/7893   Acute metabolic encephalopathy 81/11/7508   Gastroesophageal reflux disease 01/07/2022   [place] 12/20/2021   Unspecified protein-calorie malnutrition ([place]) 10/11/2021   Abdominal bloating 10/07/2021   Physical deconditioning 10/05/2021   Pressure injury of skin 10/03/2021   Dizziness 10/03/2021   Coagulopathy ([place]) 10/02/2021   Acute GI bleeding 10/01/2021   Hardening of the aorta (main artery of the heart) ([place]) 09/24/2021   Essential hypertension 09/24/2021   Neuropathy 06/15/2021   Recurrent falls 06/15/2021   Diabetic neuropathy ([place]) 03/09/2021   Diabetic retinopathy ([place]) 03/09/2021   Acute blood loss anemia, symptomatic secondary to GI bleed 03/09/2021   Other thrombophilia ([place]) 03/09/2021   Prostate cancer ([place]) 03/09/2021   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   Nonrheumatic mitral valve regurgitation 09/11/2020   Nonrheumatic tricuspid valve regurgitation 09/11/2020   PAD (peripheral artery disease) ([place]) 08/22/2017   Absolute anemia 04/02/2016    Type 2 diabetes mellitus with hyperlipidemia ([place]) 04/02/2016   Long term current use of anticoagulant 04/02/2016   Macrocytosis 04/02/2016   Arthritis, degenerative 04/02/2016   History of colon polyps 04/02/2016   Carcinoma of prostate ([place]) 04/02/2016   Abdominal discomfort, epigastric 12/20/2015   H/O adenomatous polyp of colon 12/20/2015   Left upper quadrant pain 03/16/2015   Chronic heart failure with preserved ejection fraction ([place]) 08/30/2014   S/P aortic valve replacement with bioprosthetic valve 01/19/2013   Hyperlipemia 12/21/2012   H/O prostate cancer 12/21/2012   Anticoagulated on Coumadin 12/21/2012   Chronic atrial fibrillation with RVR ([place]) 09/06/2009   PCP:  [name], MD Pharmacy:   [place], [place] - [address] [place] [place] [address] Phone: [phone] Fax: [phone]     Social Determinants of Health (SDOH) Interventions    Readmission Risk Interventions    10/20/2022   11:46 AM  Readmission Risk Prevention Plan  HRI or Home Care Consult Complete  Palliative Care Screening Not Applicable  Medication Review (RN Care Manager) Complete

## 2022-10-20 NOTE — Assessment & Plan Note (Signed)
See above

## 2022-10-20 NOTE — Evaluation (Signed)
Physical Therapy Evaluation Patient Details Name: [name] MRN: [id number] DOB: [birth year] Today's Date: 10/20/2022  History of Present Illness  Mr. Llorente is a 88 y.o. male admitted 12/15 who presents with worsening shortness of breath and weight gain.  W/u for CHF exacerbation. PMH: iron deficiency anemia secondary to chronic GI loss s/p iron infusion,hx of prostate cancer, chronic diastolic heart failure, chronic atrial fibrillation not on anticoagulation due to frequent falls and anemia, severe aortic valve stenosis s/p TAVR, severe MR and TR, PAD, CKD IIIB  Clinical Impression  Pt admitted with above diagnosis. Pt agreed to sit EOB and stood with min guard assist but declined ambulation due to fatigue.  Pt reports he uses rollator at home and lives alone. Should progress to prior level of function at d/c. Will follow acutely.  Pt currently with functional limitations due to the deficits listed below (see PT Problem List). Pt will benefit from skilled PT to increase their independence and safety with mobility to allow discharge to the venue listed below.          Recommendations for follow up therapy are one component of a multi-disciplinary discharge planning process, led by the attending physician.  Recommendations may be updated based on patient status, additional functional criteria and insurance authorization.  Follow Up Recommendations Home health PT      Assistance Recommended at Discharge Set up Supervision/Assistance  Patient can return home with the following  A little help with walking and/or transfers;A little help with bathing/dressing/bathroom;Assistance with cooking/housework;Assist for transportation;Help with stairs or ramp for entrance    Equipment Recommendations None recommended by PT  Recommendations for Other Services       Functional Status Assessment Patient has had a recent decline in their functional status and demonstrates the ability to make  significant improvements in function in a reasonable and predictable amount of time.     Precautions / Restrictions Precautions Precautions: Fall Restrictions Weight Bearing Restrictions: No      Mobility  Bed Mobility Overal bed mobility: Independent                  Transfers Overall transfer level: Needs assistance Equipment used: None Transfers: Sit to/from Stand Sit to Stand: Min guard           General transfer comment: Pt stood to feet with incr time and somewhat flexed posture. Pt did not want PT to assist him and was able to come to feet.  Pt took a few side steps to [facility] wtih flexed posture of trunk, hips and knees but was overall steady.    Ambulation/Gait               General Gait Details: declined  Financial trader [place] (Stroke Patients Only)       Balance                                             Pertinent Vitals/Pain Pain Assessment Pain Assessment: No/denies pain    Home Living Family/patient expects to be discharged to:: Private residence Living Arrangements: Alone Available Help at Discharge: Family;Available PRN/intermittently Type of Home: House Home Access: Stairs to enter Entrance Stairs-Rails: None Entrance Stairs-Number of Steps: 2   Home Layout: One level Home Equipment: Conservation officer, nature (2 wheels);Cane -  single point;BSC/3in1;Shower seat;IT sales professional (4 wheels) Additional Comments: wife at [place], daughter takes him to see her everyday    Prior Function Prior Level of Function : Needs assist             Mobility Comments: sleeps in recliner, states he uses rollator in the home and wheelchair outside of home ADLs Comments: does not drive, reports daughter assists with IADLs, has HHRN     Hand Dominance   Dominant Hand: Right    Extremity/Trunk Assessment   Upper Extremity Assessment Upper Extremity Assessment: Defer to OT  evaluation    Lower Extremity Assessment Lower Extremity Assessment: Overall WFL for tasks assessed    Cervical / Trunk Assessment Cervical / Trunk Assessment: Normal  Communication   Communication: No difficulties  Cognition Arousal/Alertness: Awake/alert Behavior During Therapy: WFL for tasks assessed/performed Overall Cognitive Status: Within Functional Limits for tasks assessed                                          General Comments General comments (skin integrity, edema, etc.): VSS    Exercises General Exercises - Lower Extremity Ankle Circles/Pumps: AROM, Both, 5 reps, Supine Long Arc Quad: AROM, Both, 5 reps, Seated   Assessment/Plan    PT Assessment Patient needs continued PT services  PT Problem List Decreased activity tolerance;Decreased balance;Decreased mobility;Decreased knowledge of use of DME;Decreased safety awareness;Decreased knowledge of precautions;Cardiopulmonary status limiting activity       PT Treatment Interventions DME instruction;Gait training;Functional mobility training;Therapeutic activities;Therapeutic exercise;Balance training;Patient/family education;Stair training    PT Goals (Current goals can be found in the Care Plan section)  Acute Rehab PT Goals Patient Stated Goal: to go home PT Goal Formulation: With patient Time For Goal Achievement: 11/03/22 Potential to Achieve Goals: Good    Frequency Min 3X/week     Co-evaluation               AM-PAC PT "6 Clicks" Mobility  Outcome Measure Help needed turning from your back to your side while in a flat bed without using bedrails?: None Help needed moving from lying on your back to sitting on the side of a flat bed without using bedrails?: None Help needed moving to and from a bed to a chair (including a wheelchair)?: A Little Help needed standing up from a chair using your arms (e.g., wheelchair or bedside chair)?: A Little Help needed to walk in hospital  room?: A Little Help needed climbing 3-5 steps with a railing? : A Lot 6 Click Score: 19    End of Session Equipment Utilized During Treatment: Gait belt Activity Tolerance: Patient limited by fatigue Patient left: in bed;with call bell/phone within reach;with bed alarm set Nurse Communication: Mobility status PT Visit Diagnosis: Muscle weakness (generalized) (M62.81)    Time: 3536-1443 PT Time Calculation (min) (ACUTE ONLY): 13 min   Charges:   PT Evaluation $PT Eval Moderate Complexity: 1 Mod          Lunell Robart M,PT Acute Rehab Services [phone]   Alvira Philips 10/20/2022, 3:42 PM

## 2022-10-20 NOTE — Progress Notes (Signed)
Progress Note  Patient Name: [name] Date of Encounter: 10/20/2022  Primary Cardiologist: [name], MD   Subjective   No events overnight. BP persistently low Patient notes that he feels better. He doesn't want rehab. He doesn't want a low sodium diet.  Inpatient Medications    Scheduled Meds:  aspirin EC  81 mg Oral Daily   dapagliflozin propanediol  10 mg Oral Daily   enoxaparin (LOVENOX) injection  30 mg Subcutaneous Daily   ferrous sulfate  325 mg Oral BID WC   insulin aspart  0-5 Units Subcutaneous QHS   insulin aspart  0-9 Units Subcutaneous TID WC   rosuvastatin  5 mg Oral Daily   Continuous Infusions:  PRN Meds: acetaminophen   Vital Signs    Vitals:   10/19/22 1946 10/20/22 0007 10/20/22 0511 10/20/22 0729  BP: 97/63 98/62 108/70 124/66  Pulse: 78  95 85  Resp: '18 17 19 19  '$ Temp: 97.6 F (36.4 C) (!) 97.4 F (36.3 C) (!) 97.4 F (36.3 C) (!) 97.5 F (36.4 C)  TempSrc: Oral Oral Oral Oral  SpO2: 97% 97% 99% 99%  Weight:   67.3 kg   Height:        Intake/Output Summary (Last 24 hours) at 10/20/2022 0802 Last data filed at 10/20/2022 0516 Gross per 24 hour  Intake 720 ml  Output 1900 ml  Net -1180 ml   Filed Weights   10/19/22 1631 10/20/22 0511  Weight: 67.7 kg 67.3 kg    Telemetry    AF with PVCs- Personally Reviewed  Physical Exam   Gen: Thin and chronically ill appearing   Neck: Severe JVD Cardiac: No Rubs or Gallops, systolic Murmur, IRIR Respiratory: Clear to auscultation bilaterally, normal effort, normal  respiratory rate GI: Soft, nontender, slightly distended MS: trace edema bilateral;  moves all extremities Neuro:  At time of evaluation, alert and oriented to person/place/time/situation  Psych: Normal affect, patient feels ok   Labs    Chemistry Recent Labs  Lab 10/18/22 1541 10/19/22 0425 10/19/22 0615 10/20/22 0034  NA 135 138  --  135  K 5.2* 3.9  --  4.0  CL 103 107  --  102   CO2 18* 19*  --  20*  GLUCOSE 167* 86  --  126*  BUN 68* 64*  --  65*  CREATININE 2.03* 1.90*  --  1.88*  CALCIUM 9.1 9.0  --  8.8*  PROT  --   --  6.3*  --   ALBUMIN  --   --  3.1*  --   AST  --   --  35  --   ALT  --   --  17  --   ALKPHOS  --   --  193*  --   BILITOT  --   --  1.4*  --   GFRNONAA 31* 34*  --  34*  ANIONGAP 14 12  --  13     Hematology Recent Labs  Lab 10/16/22 1526 10/18/22 1541 10/20/22 0034  WBC 5.6 5.4 5.4  RBC 3.22* 3.32* 3.35*  HGB 10.5* 10.6* 10.8*  HCT 31.6* 32.8* 31.9*  MCV 98.1 98.8 95.2  MCH 32.6 31.9 32.2  MCHC 33.2 32.3 33.9  RDW 17.4* 17.8* 18.2*  PLT 168 158 155    Cardiac EnzymesNo results for input(s): "TROPONINI" in the last 168 hours. No results for input(s): "TROPIPOC" in the last 168 hours.   BNP Recent Labs  Lab 10/18/22 1541  BNP 1,918.3*     DDimer No results for input(s): "DDIMER" in the last 168 hours.   Radiology    ECHOCARDIOGRAM COMPLETE  Result Date: 10/19/2022    ECHOCARDIOGRAM REPORT   Patient Name:   COLESON KANT Date of Exam: 10/19/2022 Medical Rec #:  [id number]         Height:       68.5 in Accession #:    [id number]        Weight:       154.2 lb Date of Birth:  Dec 15, 1932          BSA:          1.840 m Patient Age:    68 years          BP:           111/71 mmHg Patient Gender: M                 HR:           88 bpm. Exam Location:  Inpatient Procedure: 2D Echo Indications:    acute diastolic chf  History:        Patient has prior history of Echocardiogram examinations, most                 recent 03/02/2022. Chronic kidney disease, Arrythmias:Atrial                 Fibrillation, Signs/Symptoms:Dyspnea; Risk Factors:Diabetes and                 Dyslipidemia.                 Aortic Valve: 26 mm Sapien bioprosthetic valve is present in the                 aortic position.  Sonographer:    Johny Chess RDCS Referring Phys: [id number] [place]  1. Left ventricular ejection fraction, by  estimation, is 45 to 50%. The left ventricle has mildly decreased function. The left ventricle demonstrates global hypokinesis. Left ventricular diastolic parameters are indeterminate. There is the interventricular septum is flattened in systole and diastole, consistent with right ventricular pressure and volume overload.  2. Right ventricular systolic function is mildly reduced. The right ventricular size is moderately enlarged. Tricuspid regurgitation signal is inadequate for assessing PA pressure. The estimated right ventricular systolic pressure is 56.8 mmHg.  3. Left atrial size was severely dilated.  4. Right atrial size was severely dilated.  5. The mitral valve is grossly normal. Moderate mitral valve regurgitation. Moderate mitral annular calcification.  6. Tricuspid valve regurgitation is severe.  7. The aortic valve has been repaired/replaced. Aortic valve regurgitation is mild. There is a 26 mm Sapien bioprosthetic valve present in the aortic position. Aortic valve mean gradient measures 6.2 mmHg. Mild PVL unchanged from prior.  8. The inferior vena cava is dilated in size with <50% respiratory variability, suggesting right atrial pressure of 15 mmHg. Comparison(s): Prior images reviewed side by side. LVEF is decreased from prior (this study uses echo contrast). MR has improved. FINDINGS  Left Ventricle: Left ventricular ejection fraction, by estimation, is 45 to 50%. The left ventricle has mildly decreased function. The left ventricle demonstrates global hypokinesis. The left ventricular internal cavity size was normal in size. There is  no left ventricular hypertrophy. The interventricular septum is flattened in systole and diastole, consistent with right ventricular pressure and volume overload. Left ventricular diastolic parameters  are indeterminate. Right Ventricle: The right ventricular size is moderately enlarged. No increase in right ventricular wall thickness. Right ventricular systolic function  is mildly reduced. Tricuspid regurgitation signal is inadequate for assessing PA pressure. The tricuspid regurgitant velocity is 2.56 m/s, and with an assumed right atrial pressure of 15 mmHg, the estimated right ventricular systolic pressure is 23.5 mmHg. Left Atrium: Left atrial size was severely dilated. Right Atrium: Right atrial size was severely dilated. Pericardium: Trivial pericardial effusion is present. The pericardial effusion is anterior to the right ventricle. Mitral Valve: The mitral valve is grossly normal. Moderate mitral annular calcification. Moderate mitral valve regurgitation. Tricuspid Valve: The tricuspid valve is normal in structure. Tricuspid valve regurgitation is severe. No evidence of tricuspid stenosis. Aortic Valve: The aortic valve has been repaired/replaced. Aortic valve regurgitation is mild. Aortic valve mean gradient measures 6.2 mmHg. Aortic valve peak gradient measures 11.8 mmHg. Aortic valve area, by VTI measures 1.03 cm. There is a 26 mm Sapien bioprosthetic valve present in the aortic position. Pulmonic Valve: The pulmonic valve was normal in structure. Pulmonic valve regurgitation is not visualized. No evidence of pulmonic stenosis. Aorta: The aortic root and ascending aorta are structurally normal, with no evidence of dilitation. Venous: The inferior vena cava is dilated in size with less than 50% respiratory variability, suggesting right atrial pressure of 15 mmHg. IAS/Shunts: No atrial level shunt detected by color flow Doppler.  LEFT VENTRICLE PLAX 2D LVIDd:         4.80 cm LVIDs:         3.60 cm LV PW:         1.00 cm LV IVS:        1.00 cm LVOT diam:     1.70 cm LV SV:         25 LV SV Index:   14 LVOT Area:     2.27 cm  RIGHT VENTRICLE          IVC RV Basal diam:  3.50 cm  IVC diam: 2.70 cm TAPSE (M-mode): 1.3 cm LEFT ATRIUM              Index        RIGHT ATRIUM           Index LA diam:        4.80 cm  2.61 cm/m   RA Area:     28.30 cm LA Vol (A2C):   109.0 ml  59.24 ml/m  RA Volume:   89.20 ml  48.48 ml/m LA Vol (A4C):   80.4 ml  43.70 ml/m LA Biplane Vol: 94.7 ml  51.47 ml/m  AORTIC VALVE AV Area (Vmax):    1.04 cm AV Area (Vmean):   0.95 cm AV Area (VTI):     1.03 cm AV Vmax:           172.00 cm/s AV Vmean:          113.250 cm/s AV VTI:            0.242 m AV Peak Grad:      11.8 mmHg AV Mean Grad:      6.2 mmHg LVOT Vmax:         79.00 cm/s LVOT Vmean:        47.175 cm/s LVOT VTI:          0.110 m LVOT/AV VTI ratio: 0.46  AORTA Ao Asc diam: 2.90 cm MR Peak grad: 80.6 mmHg   TRICUSPID VALVE MR Mean grad: 51.0  mmHg   TR Peak grad:   26.2 mmHg MR Vmax:      449.00 cm/s TR Vmax:        256.00 cm/s MR Vmean:     341.0 cm/s                           SHUNTS                           Systemic VTI:  0.11 m                           Systemic Diam: 1.70 cm Rudean Haskell MD Electronically signed by Rudean Haskell MD Signature Date/Time: 10/19/2022/11:26:57 AM    Final    DG Chest 2 View  Result Date: 10/18/2022 CLINICAL DATA:  Shortness of breath. EXAM: CHEST - 2 VIEW COMPARISON:  March 01, 2022. FINDINGS: Stable cardiomegaly. Both lungs are clear. The visualized skeletal structures are unremarkable. IMPRESSION: No active cardiopulmonary disease. Electronically Signed   By: Marijo Conception M.D.   On: 10/18/2022 16:12    Cardiac Studies   Cardiac Studies & Procedures       ECHOCARDIOGRAM  ECHOCARDIOGRAM COMPLETE 10/19/2022  Narrative ECHOCARDIOGRAM REPORT    Patient Name:   JARAN SAINZ Date of Exam: 10/19/2022 Medical Rec #:  [id number]         Height:       68.5 in Accession #:    [id number]        Weight:       154.2 lb Date of Birth:  May 03, 1933          BSA:          1.840 m Patient Age:    17 years          BP:           111/71 mmHg Patient Gender: M                 HR:           88 bpm. Exam Location:  Inpatient  Procedure: 2D Echo  Indications:    acute diastolic chf  History:        Patient has prior history of  Echocardiogram examinations, most recent 03/02/2022. Chronic kidney disease, Arrythmias:Atrial Fibrillation, Signs/Symptoms:Dyspnea; Risk Factors:Diabetes and Dyslipidemia. Aortic Valve: 26 mm Sapien bioprosthetic valve is present in the aortic position.  Sonographer:    Johny Chess RDCS Referring Phys: [id number] [place]   1. Left ventricular ejection fraction, by estimation, is 45 to 50%. The left ventricle has mildly decreased function. The left ventricle demonstrates global hypokinesis. Left ventricular diastolic parameters are indeterminate. There is the interventricular septum is flattened in systole and diastole, consistent with right ventricular pressure and volume overload. 2. Right ventricular systolic function is mildly reduced. The right ventricular size is moderately enlarged. Tricuspid regurgitation signal is inadequate for assessing PA pressure. The estimated right ventricular systolic pressure is 02.7 mmHg. 3. Left atrial size was severely dilated. 4. Right atrial size was severely dilated. 5. The mitral valve is grossly normal. Moderate mitral valve regurgitation. Moderate mitral annular calcification. 6. Tricuspid valve regurgitation is severe. 7. The aortic valve has been repaired/replaced. Aortic valve regurgitation is mild. There is a 26 mm Sapien bioprosthetic valve present in the aortic position. Aortic valve mean gradient measures 6.2 mmHg. Mild  PVL unchanged from prior. 8. The inferior vena cava is dilated in size with <50% respiratory variability, suggesting right atrial pressure of 15 mmHg.  Comparison(s): Prior images reviewed side by side. LVEF is decreased from prior (this study uses echo contrast). MR has improved.  FINDINGS Left Ventricle: Left ventricular ejection fraction, by estimation, is 45 to 50%. The left ventricle has mildly decreased function. The left ventricle demonstrates global hypokinesis. The left ventricular internal  cavity size was normal in size. There is no left ventricular hypertrophy. The interventricular septum is flattened in systole and diastole, consistent with right ventricular pressure and volume overload. Left ventricular diastolic parameters are indeterminate.  Right Ventricle: The right ventricular size is moderately enlarged. No increase in right ventricular wall thickness. Right ventricular systolic function is mildly reduced. Tricuspid regurgitation signal is inadequate for assessing PA pressure. The tricuspid regurgitant velocity is 2.56 m/s, and with an assumed right atrial pressure of 15 mmHg, the estimated right ventricular systolic pressure is 81.4 mmHg.  Left Atrium: Left atrial size was severely dilated.  Right Atrium: Right atrial size was severely dilated.  Pericardium: Trivial pericardial effusion is present. The pericardial effusion is anterior to the right ventricle.  Mitral Valve: The mitral valve is grossly normal. Moderate mitral annular calcification. Moderate mitral valve regurgitation.  Tricuspid Valve: The tricuspid valve is normal in structure. Tricuspid valve regurgitation is severe. No evidence of tricuspid stenosis.  Aortic Valve: The aortic valve has been repaired/replaced. Aortic valve regurgitation is mild. Aortic valve mean gradient measures 6.2 mmHg. Aortic valve peak gradient measures 11.8 mmHg. Aortic valve area, by VTI measures 1.03 cm. There is a 26 mm Sapien bioprosthetic valve present in the aortic position.  Pulmonic Valve: The pulmonic valve was normal in structure. Pulmonic valve regurgitation is not visualized. No evidence of pulmonic stenosis.  Aorta: The aortic root and ascending aorta are structurally normal, with no evidence of dilitation.  Venous: The inferior vena cava is dilated in size with less than 50% respiratory variability, suggesting right atrial pressure of 15 mmHg.  IAS/Shunts: No atrial level shunt detected by color flow  Doppler.   LEFT VENTRICLE PLAX 2D LVIDd:         4.80 cm LVIDs:         3.60 cm LV PW:         1.00 cm LV IVS:        1.00 cm LVOT diam:     1.70 cm LV SV:         25 LV SV Index:   14 LVOT Area:     2.27 cm   RIGHT VENTRICLE          IVC RV Basal diam:  3.50 cm  IVC diam: 2.70 cm TAPSE (M-mode): 1.3 cm  LEFT ATRIUM              Index        RIGHT ATRIUM           Index LA diam:        4.80 cm  2.61 cm/m   RA Area:     28.30 cm LA Vol (A2C):   109.0 ml 59.24 ml/m  RA Volume:   89.20 ml  48.48 ml/m LA Vol (A4C):   80.4 ml  43.70 ml/m LA Biplane Vol: 94.7 ml  51.47 ml/m AORTIC VALVE AV Area (Vmax):    1.04 cm AV Area (Vmean):   0.95 cm AV Area (VTI):     1.03 cm  AV Vmax:           172.00 cm/s AV Vmean:          113.250 cm/s AV VTI:            0.242 m AV Peak Grad:      11.8 mmHg AV Mean Grad:      6.2 mmHg LVOT Vmax:         79.00 cm/s LVOT Vmean:        47.175 cm/s LVOT VTI:          0.110 m LVOT/AV VTI ratio: 0.46  AORTA Ao Asc diam: 2.90 cm  MR Peak grad: 80.6 mmHg   TRICUSPID VALVE MR Mean grad: 51.0 mmHg   TR Peak grad:   26.2 mmHg MR Vmax:      449.00 cm/s TR Vmax:        256.00 cm/s MR Vmean:     341.0 cm/s SHUNTS Systemic VTI:  0.11 m Systemic Diam: 1.70 cm  Rudean Haskell MD Electronically signed by Rudean Haskell MD Signature Date/Time: 10/19/2022/11:26:57 AM    Final     CT SCANS  CT CORONARY MORPH W/CTA COR W/SCORE 12/30/2012  Narrative *RADIOLOGY REPORT*  INDICATION: 88 year old male with history of severe aortic stenosis.  Preprocedural study for possible transcatheter aortic valve replacement (TAVR) procedure.  CT ANGIOGRAPHY OF THE HEART, CORONARY ARTERY, STRUCTURE, AND MORPHOLOGY  COMPARISON:  No priors.  CONTRAST: 58m OMNIPAQUE IOHEXOL 350 MG/ML SOLN  TECHNIQUE:  CT angiography of the coronary vessels was performed on a 256 channel system using prospective ECG gating.  A scout and ECG- gated noncontrast  exam (for calcium scoring) were performed. Appropriate delay was determined by bolus tracking after injection of iodinated contrast, and an ECG-gated coronary CTA was performed with sub-mm slice collimation throughout the cardiac cycle. Imaging post processing was performed on an independent workstation creating multiplanar and 3-D images, allowing for quantitative analysis of the heart and coronary arteries.  Note that this exam targets the heart and the chest was not imaged in its entirety.  PREMEDICATION: None.  FINDINGS: Technical quality: Good. Heart rate:  55-68 bpm (patient was in atrial fibrillation).  CORONARY ARTERIES: Assessment not performed secondary to the excessive imaging noise, small amount of motion related artifact (related to irregular rhythm) and high degree of coronary artery calcification.  The patient has had a recent catheter angiogram as well.  Aortic Root Measurements Pertinent to Potential TAVR Procedure:  Aortic Valve Description:  The aortic valve was tricuspid with severe thickening and calcification of all of the cusps.  During systole, there was limited valve opening with an estimated aortic valve area of approximately 0.84 cm2 by planimetry.  Aortic Valve Area: 0.84 cm-sq  Annulus: Planimetry - 5.6 cm2 Long & Short Axis - 31.0 x 25.2 mm Circumference - 87.3 mm  Sinuses of Valsalva: R SOV -     width 34.1 mm height 26.8 mm L SOV -     width 33.5 mm  height 20.3 mm [place] SOV -    width 33.6 mm height 21.0 mm  Coronary Artery [place]: L main - 15.2 mm from the annulus RCA - 19.8 mm from the annulus  Ascending Aorta: 33.1 x 32.0 mm in diameter 4 cm above the annulus.  Potential Angiographic Angles: LAO 11 degrees, Caudal 7 degrees RAO 10 degrees, Caudal 28 degrees  OTHER AORTA AND PULMONARY MEASUREMENTS: Descending aorta:  (< 40 mm): 31 mm Main pulmonary artery: (< 30 mm): 28 mm  OTHER FINDINGS: Within the visualized portions of  the thorax there are no suspicious appearing pulmonary nodules, areas of airspace consolidation or pleural effusions.  Mild diffuse bronchial wall thickening and some mild paraseptal emphysematous changes are noted. There is no significant pericardial fluid, thickening or pericardial calcification.  Numerous borderline enlarged and mildly enlarged mediastinal lymph nodes are noted, largest of which measures up to 1.3 cm in short axis in the subcarinal station. There are no aggressive appearing lytic or blastic lesions noted in the visualized portions of the skeleton.  IMPRESSION: 1.  Severely sclerotic aortic valve with an estimated aortic valve area of 0.84 cm2 by planimetry.  Findings and measurements pertinent to potential TAVR procedure, as detailed above. 2. Numerous borderline enlarged and mildly enlarged mediastinal lymph nodes are nonspecific.  Similar findings were seen in the abdomen and pelvis (see that report for discussion and details). Clinical correlation is recommended.  Another differential consideration would include an underlying lymphoproliferative disorder. 3.  Mild diffuse bronchial wall thickening with mild paraseptal emphysematous changes.   Original Report Authenticated By: Vinnie Langton, M.D.           Patient Profile     88 y.o. 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/p TAVR #AKI on CKD Likely Cardiorenal - patient has had structural evaluationat and patient is not a candidate for mitral or tricuspid valve structural interventions - holding BB unless AF RVR - lasix 80 mg PO BID for maintenance - Maintain K >4, Mg>2 - [place] - no MRA at this time  See December outpatient note and 10/19/22 for [place] discussions He may potentially soon- we will work to arrange close follow up Keep 10/23/22 lab  follow up visit Will arrange close follow up based on this   #Type II NSTEMI #CAD -Patient with elevated troponins in the setting of acute decompensated CHF and cardiorenal syndrome.  -Continue ASA 81 mg daily -Continue Crestor 5 mg daily - no ischemic evaluation planned   #Permanent Afib - he had failed AC due to anemia; on ASA not a Watchman Candidate -Holding metoprolol as described above with plans to restart when able -Maintain telemetry   #HTN #HLD -continue crestor   #PAD -ASA and statin   #Chronic Anemia -Recent iron studies checked on 12/13 without IDA.     For questions or updates, please contact [facility] Please consult [email or web address] for contact info under Cardiology/STEMI.      Rudean Haskell, MD FASE [facility], [address] [place], [place] [address] [phone]  8:02 AM

## 2022-10-21 DIAGNOSIS — E875 Hyperkalemia: Secondary | ICD-10-CM | POA: Diagnosis not present

## 2022-10-21 DIAGNOSIS — I10 Essential (primary) hypertension: Secondary | ICD-10-CM | POA: Diagnosis not present

## 2022-10-21 DIAGNOSIS — I5043 Acute on chronic combined systolic (congestive) and diastolic (congestive) heart failure: Secondary | ICD-10-CM

## 2022-10-21 DIAGNOSIS — N179 Acute kidney failure, unspecified: Secondary | ICD-10-CM | POA: Diagnosis not present

## 2022-10-21 LAB — CBC
HCT: 33.1 % — ABNORMAL LOW (ref 39.0–52.0)
Hemoglobin: 11.1 g/dL — ABNORMAL LOW (ref 13.0–17.0)
MCH: 32.7 pg (ref 26.0–34.0)
MCHC: 33.5 g/dL (ref 30.0–36.0)
MCV: 97.6 fL (ref 80.0–100.0)
Platelets: 166 10*3/uL (ref 150–400)
RBC: 3.39 MIL/uL — ABNORMAL LOW (ref 4.22–5.81)
RDW: 18.4 % — ABNORMAL HIGH (ref 11.5–15.5)
WBC: 6.2 10*3/uL (ref 4.0–10.5)
nRBC: 0.3 % — ABNORMAL HIGH (ref 0.0–0.2)

## 2022-10-21 LAB — BASIC METABOLIC PANEL
Anion gap: 12 (ref 5–15)
BUN: 59 mg/dL — ABNORMAL HIGH (ref 8–23)
CO2: 22 mmol/L (ref 22–32)
Calcium: 8.7 mg/dL — ABNORMAL LOW (ref 8.9–10.3)
Chloride: 101 mmol/L (ref 98–111)
Creatinine, Ser: 1.72 mg/dL — ABNORMAL HIGH (ref 0.61–1.24)
GFR, Estimated: 38 mL/min — ABNORMAL LOW (ref >60)
Glucose, Bld: 172 mg/dL — ABNORMAL HIGH (ref 70–99)
Potassium: 3.8 mmol/L (ref 3.5–5.1)
Sodium: 135 mmol/L (ref 135–145)

## 2022-10-21 LAB — GLUCOSE, CAPILLARY: Glucose-Capillary: 239 mg/dL — ABNORMAL HIGH (ref 70–99)

## 2022-10-21 LAB — MAGNESIUM: Magnesium: 2.2 mg/dL (ref 1.7–2.4)

## 2022-10-21 MED ORDER — HYDROXYZINE HCL 25 MG PO TABS
25.0000 mg | ORAL_TABLET | Freq: Three times a day (TID) | ORAL | Status: DC | PRN
  Administered 2022-10-21: 25 mg via ORAL
  Filled 2022-10-21: qty 1

## 2022-10-21 MED ORDER — FUROSEMIDE 40 MG PO TABS: 80.0000 mg | ORAL_TABLET | Freq: Two times a day (BID) | ORAL | 6 refills | Status: DC

## 2022-10-21 NOTE — Plan of Care (Signed)
Problem: Education: Goal: Ability to demonstrate management of disease process will improve 10/21/2022 0932 by Karlyn Agee, RN Outcome: Completed/Met 10/21/2022 0932 by Karlyn Agee, RN Outcome: Progressing Goal: Ability to verbalize understanding of medication therapies will improve 10/21/2022 0932 by Karlyn Agee, RN Outcome: Completed/Met 10/21/2022 0932 by Karlyn Agee, RN Outcome: Progressing Goal: Individualized Educational Video(s) 10/21/2022 0932 by Karlyn Agee, RN Outcome: Completed/Met 10/21/2022 0932 by Karlyn Agee, RN Outcome: Progressing   Problem: Activity: Goal: Capacity to carry out activities will improve 10/21/2022 0932 by Karlyn Agee, RN Outcome: Completed/Met 10/21/2022 0932 by Karlyn Agee, RN Outcome: Progressing   Problem: Cardiac: Goal: Ability to achieve and maintain adequate cardiopulmonary perfusion will improve 10/21/2022 0932 by Karlyn Agee, RN Outcome: Completed/Met 10/21/2022 0932 by Karlyn Agee, RN Outcome: Progressing   Problem: Education: Goal: Ability to describe self-care measures that may prevent or decrease complications (Diabetes Survival Skills Education) will improve 10/21/2022 0932 by Karlyn Agee, RN Outcome: Completed/Met 10/21/2022 0932 by Karlyn Agee, RN Outcome: Progressing Goal: Individualized Educational Video(s) 10/21/2022 0932 by Karlyn Agee, RN Outcome: Completed/Met 10/21/2022 0932 by Karlyn Agee, RN Outcome: Progressing   Problem: Coping: Goal: Ability to adjust to condition or change in health will improve 10/21/2022 0932 by Karlyn Agee, RN Outcome: Completed/Met 10/21/2022 0932 by Karlyn Agee, RN Outcome: Progressing   Problem: Fluid Volume: Goal: Ability to maintain a balanced intake and output will improve 10/21/2022 0932 by Karlyn Agee, RN Outcome: Completed/Met 10/21/2022 0932 by Karlyn Agee, RN Outcome: Progressing   Problem: Health  Behavior/Discharge Planning: Goal: Ability to identify and utilize available resources and services will improve 10/21/2022 0932 by Karlyn Agee, RN Outcome: Completed/Met 10/21/2022 0932 by Karlyn Agee, RN Outcome: Progressing Goal: Ability to manage health-related needs will improve 10/21/2022 0932 by Karlyn Agee, RN Outcome: Completed/Met 10/21/2022 0932 by Karlyn Agee, RN Outcome: Progressing   Problem: Metabolic: Goal: Ability to maintain appropriate glucose levels will improve 10/21/2022 0932 by Karlyn Agee, RN Outcome: Completed/Met 10/21/2022 0932 by Karlyn Agee, RN Outcome: Progressing   Problem: Nutritional: Goal: Maintenance of adequate nutrition will improve 10/21/2022 0932 by Karlyn Agee, RN Outcome: Completed/Met 10/21/2022 0932 by Karlyn Agee, RN Outcome: Progressing Goal: Progress toward achieving an optimal weight will improve 10/21/2022 0932 by Karlyn Agee, RN Outcome: Completed/Met 10/21/2022 0932 by Karlyn Agee, RN Outcome: Progressing   Problem: Skin Integrity: Goal: Risk for impaired skin integrity will decrease 10/21/2022 0932 by Karlyn Agee, RN Outcome: Completed/Met 10/21/2022 0932 by Karlyn Agee, RN Outcome: Progressing   Problem: Tissue Perfusion: Goal: Adequacy of tissue perfusion will improve 10/21/2022 0932 by Karlyn Agee, RN Outcome: Completed/Met 10/21/2022 0932 by Karlyn Agee, RN Outcome: Progressing   Problem: Education: Goal: Knowledge of General Education information will improve Description: Including pain rating scale, medication(s)/side effects and non-pharmacologic comfort measures 10/21/2022 0932 by Karlyn Agee, RN Outcome: Completed/Met 10/21/2022 0932 by Karlyn Agee, RN Outcome: Progressing   Problem: Health Behavior/Discharge Planning: Goal: Ability to manage health-related needs will improve 10/21/2022 0932 by Karlyn Agee, RN Outcome:  Completed/Met 10/21/2022 0932 by Karlyn Agee, RN Outcome: Progressing   Problem: Clinical Measurements: Goal: Ability to maintain clinical measurements within normal limits will improve 10/21/2022 0932 by Karlyn Agee, RN Outcome: Completed/Met 10/21/2022 0932 by Karlyn Agee, RN Outcome: Progressing Goal: Will remain free from infection 10/21/2022 0932  by Karlyn Agee, RN Outcome: Completed/Met 10/21/2022 0932 by Karlyn Agee, RN Outcome: Progressing Goal: Diagnostic test results will improve 10/21/2022 0932 by Karlyn Agee, RN Outcome: Completed/Met 10/21/2022 0932 by Karlyn Agee, RN Outcome: Progressing Goal: Respiratory complications will improve 10/21/2022 0932 by Karlyn Agee, RN Outcome: Completed/Met 10/21/2022 0932 by Karlyn Agee, RN Outcome: Progressing Goal: Cardiovascular complication will be avoided 10/21/2022 0932 by Karlyn Agee, RN Outcome: Completed/Met 10/21/2022 0932 by Karlyn Agee, RN Outcome: Progressing   Problem: Activity: Goal: Risk for activity intolerance will decrease 10/21/2022 0932 by Karlyn Agee, RN Outcome: Completed/Met 10/21/2022 0932 by Karlyn Agee, RN Outcome: Progressing   Problem: Nutrition: Goal: Adequate nutrition will be maintained 10/21/2022 0932 by Karlyn Agee, RN Outcome: Completed/Met 10/21/2022 0932 by Karlyn Agee, RN Outcome: Progressing   Problem: Coping: Goal: Level of anxiety will decrease 10/21/2022 0932 by Karlyn Agee, RN Outcome: Completed/Met 10/21/2022 0932 by Karlyn Agee, RN Outcome: Progressing   Problem: Elimination: Goal: Will not experience complications related to bowel motility 10/21/2022 0932 by Karlyn Agee, RN Outcome: Completed/Met 10/21/2022 0932 by Karlyn Agee, RN Outcome: Progressing Goal: Will not experience complications related to urinary retention 10/21/2022 0932 by Karlyn Agee, RN Outcome: Completed/Met 10/21/2022  0932 by Karlyn Agee, RN Outcome: Progressing   Problem: Pain Managment: Goal: General experience of comfort will improve 10/21/2022 0932 by Karlyn Agee, RN Outcome: Completed/Met 10/21/2022 0932 by Karlyn Agee, RN Outcome: Progressing   Problem: Safety: Goal: Ability to remain free from injury will improve 10/21/2022 0932 by Karlyn Agee, RN Outcome: Completed/Met 10/21/2022 0932 by Karlyn Agee, RN Outcome: Progressing   Problem: Skin Integrity: Goal: Risk for impaired skin integrity will decrease 10/21/2022 0932 by Karlyn Agee, RN Outcome: Completed/Met 10/21/2022 0932 by Karlyn Agee, RN Outcome: Progressing

## 2022-10-21 NOTE — Progress Notes (Signed)
KEYAN, FOLSON ([id number]) [id number] Page 1 of 7 Visit Report for 10/15/2022 Chief Complaint Document Details Patient Name: Date of Service: [name], [name] 10/15/2022 8:00 A M Medical Record Number: [id number] Patient Account Number: [id number] Date of Birth/Sex: Treating RN: 1933/04/09 (88 y.o. M) Primary Care Provider: [name] Other Clinician: Referring Provider: Treating Provider/Extender: Maurine Cane, Mahesh Weeks in Treatment: 0 Information Obtained from: Patient Chief Complaint 06/22/2020; patient comes in today out of concern for increased swelling in his lower legs 10/15/2022; patient presents for concern of dry flaking skin to his lower extremities bilaterally Electronic Signature(s) Signed: 10/15/2022 12:07:10 PM By: Kalman Shan DO Entered By: Kalman Shan on 10/15/2022 09:33:27 -------------------------------------------------------------------------------- HPI Details Patient Name: Date of Service: Aaron Salon D. 10/15/2022 8:00 A M Medical Record Number: [id number] Patient Account Number: [id number] Date of Birth/Sex: Treating RN: 02/21/1933 (88 y.o. M) Primary Care Provider: [name] Other Clinician: Referring Provider: Treating Provider/Extender: Neoma Laming Weeks in Treatment: 0 History of Present Illness HPI Description: ADMISSION 06/22/2020 This is an 88 year old man who tells [place] that roughly a month ago he noted increased swelling of his lower legs with weeping fluid. He felt his legs were "about to pop]. He was in this clinic in 2015 with venous insufficiency ulcers. He has zippered stockings but he did not bring them in. He states that he went to see his cardiologist and his amlodipine was discontinued and this is helped with the edema in his legs. He is wearing his stockings and the weeping has stopped. The patient had arterial studies in January of this  year. This showed noncompressible waveforms bilaterally but with great toe pressures at 0.76 on the right and 0.61 on the left. Waveforms were biphasic and triphasic. Past medical history includes type 2 diabetes with peripheral neuropathy, history of venous insufficiency, spinal stenosis, ingrown toenails followed by podiatry, coronary artery disease and the patient tells me he has had a bovine aortic valve replacement 10/15/2022 [name] is an 88 year old male with a past medical history of controlled type 2 diabetes, prostate cancer and venous insufficiency that presents to the clinic for dry flaky skin. He has no open wounds. He has venous stasis dermatitis. He states he has compression stockings but is not using them. He has no weeping or blisters noted. Electronic Signature(s) Signed: 10/15/2022 12:07:10 PM By: Kalman Shan DO Entered By: Kalman Shan on 10/15/2022 09:34:06 [name] ([id number]) [id number] Page 2 of 7 -------------------------------------------------------------------------------- Physical Exam Details Patient Name: Date of Service: [name], [name] 10/15/2022 8:00 A M Medical Record Number: [id number] Patient Account Number: [id number] Date of Birth/Sex: Treating RN: 12-15-1932 (88 y.o. M) Primary Care Provider: [name] Other Clinician: Referring Provider: Treating Provider/Extender: Maurine Cane, Mahesh Weeks in Treatment: 0 Constitutional respirations regular, non-labored and within target range for patient.. Cardiovascular 2+ dorsalis pedis/posterior tibialis pulses. Psychiatric pleasant and cooperative. Notes Bilateral lower extremities with dry flaky skin, minimal swelling on exam. Hemosiderin staining throughout the lower extremities. No open wounds. No weeping noted. Electronic Signature(s) Signed: 10/15/2022 12:07:10 PM By: Kalman Shan DO Entered By: Kalman Shan on 10/15/2022 09:34:39 -------------------------------------------------------------------------------- Physician Orders Details Patient Name: Date of Service: Aaron Salon D. 10/15/2022 8:00 A M Medical Record Number: [id number] Patient Account Number: [id number] Date of Birth/Sex: Treating RN: 07-Apr-1933 (89 y.o. Hessie Diener Primary Care Provider: [name] Other Clinician: Referring Provider: Treating Provider/Extender: Neoma Laming Weeks in Treatment: 0 Verbal / Phone Orders: No  Diagnosis Coding Discharge From [facility] Discharge from [place] - Call if any future wound care needs. Edema Control - Lymphedema / SCD / Other Elevate legs to the level of the heart or above for 30 minutes daily and/or when sitting, a frequency of: - 3-4 times a day throughout the day. Avoid standing for long periods of time. Patient to wear own compression stockings every day. - wear daily. Exercise regularly Moisturize legs daily. - every night before bed. Compression stocking or Garment 20-30 mm/Hg pressure to: - apply in the morning and remove at night. Other Edema Control Orders/Instructions: - office to apply tubigrip size D one layer to both legs. Once home remove and apply your own stockings. Electronic Signature(s) Signed: 10/15/2022 12:07:10 PM By: Kalman Shan DO Entered By: Kalman Shan on 10/15/2022 09:34:44 [name] ([id number]) [id number] Page 3 of 7 -------------------------------------------------------------------------------- Problem List Details Patient Name: Date of Service: [name], [name] 10/15/2022 8:00 A M Medical Record Number: [id number] Patient Account Number: [id number] Date of Birth/Sex: Treating RN: 1933-10-06 (88 y.o. M) Primary Care Provider: [name] Other Clinician: Referring Provider: Treating Provider/Extender: Maurine Cane,  Mahesh Weeks in Treatment: 0 Active Problems ICD-10 Encounter Code Description Active Date MDM Diagnosis I87.2 Venous insufficiency (chronic) (peripheral) 10/15/2022 No Yes E11.628 Type 2 diabetes mellitus with other skin complications 12/45/8099 No Yes C61 Malignant neoplasm of prostate 10/15/2022 No Yes Inactive Problems Resolved Problems Electronic Signature(s) Signed: 10/15/2022 12:07:10 PM By: Kalman Shan DO Entered By: Kalman Shan on 10/15/2022 09:32:50 -------------------------------------------------------------------------------- Progress Note Details Patient Name: Date of Service: Aaron Salon D. 10/15/2022 8:00 A M Medical Record Number: [id number] Patient Account Number: [id number] Date of Birth/Sex: Treating RN: 10-18-1933 (88 y.o. M) Primary Care Provider: [name] Other Clinician: Referring Provider: Treating Provider/Extender: Maurine Cane, Mahesh Weeks in Treatment: 0 Subjective Chief Complaint Information obtained from Patient 06/22/2020; patient comes in today out of concern for increased swelling in his lower legs 10/15/2022; patient presents for concern of dry flaking skin to his lower extremities bilaterally History of Present Illness (HPI) ADMISSION 06/22/2020 This is an 88 year old man who tells [place] that roughly a month ago he noted increased swelling of his lower legs with weeping fluid. He felt his legs were "about to pop]. He was in this clinic in 2015 with venous insufficiency ulcers. He has zippered stockings but he did not bring them in. He states that he went to see his cardiologist and his amlodipine was discontinued and this is helped with the edema in his legs. He is wearing his stockings and the weeping has stopped. The patient had arterial studies in January of this year. This showed noncompressible waveforms bilaterally but with great toe pressures at 0.76 on the right and 0.61 on the left. Waveforms were  biphasic and triphasic. [name], [name] ([id number]) [id number] Page 4 of 7 Past medical history includes type 2 diabetes with peripheral neuropathy, history of venous insufficiency, spinal stenosis, ingrown toenails followed by podiatry, coronary artery disease and the patient tells me he has had a bovine aortic valve replacement 10/15/2022 [name] is an 88 year old male with a past medical history of controlled type 2 diabetes, prostate cancer and venous insufficiency that presents to the clinic for dry flaky skin. He has no open wounds. He has venous stasis dermatitis. He states he has compression stockings but is not using them. He has no weeping or blisters noted. Patient History Information obtained from Patient. Allergies amoxicillin Family History Cancer -  Mother,Father,Siblings, Diabetes - Mother, Heart Disease - Father, Stroke - Siblings, No family history of Hereditary Spherocytosis, Hypertension, Kidney Disease, Lung Disease, Seizures, Thyroid Problems, Tuberculosis. Social History Former smoker, Alcohol Use - Never, Drug Use - No History, Caffeine Use - Never. Medical History Hematologic/Lymphatic Patient has history of Anemia Cardiovascular Patient has history of Arrhythmia - afib, Congestive Heart Failure, Coronary Artery Disease, Hypertension, Peripheral Arterial Disease Endocrine Patient has history of Type II Diabetes Integumentary (Skin) Denies history of History of Burn Musculoskeletal Patient has history of Osteoarthritis Neurologic Patient has history of Neuropathy Oncologic Patient has history of Received Chemotherapy, Received Radiation Medical A Surgical History Notes nd Cardiovascular aortic valve replacement Gastrointestinal GERD Genitourinary prostate cancer CKD stage II Integumentary (Skin) legs Musculoskeletal arthritis Oncologic prostate cancer Review of Systems (ROS) Respiratory Complains or  has symptoms of Shortness of Breath. Integumentary (Skin) Complains or has symptoms of Wounds - right lower leg. Musculoskeletal Complains or has symptoms of Muscle Weakness. Objective Constitutional respirations regular, non-labored and within target range for patient.. Vitals Time Taken: 8:30 AM, Height: 68 in, Source: Stated, Weight: 150 lbs, Source: Stated, BMI: 22.8, Temperature: 97.7 F, Pulse: 90 bpm, Respiratory Rate: 20 breaths/min, Blood Pressure: 123/64 mmHg, Capillary Blood Glucose: 138 mg/dl. Cardiovascular 2+ dorsalis pedis/posterior tibialis pulses. Psychiatric pleasant and cooperative. General Notes: Bilateral lower extremities with dry flaky skin, minimal swelling on exam. Hemosiderin staining throughout the lower extremities. No open wounds. No weeping noted. [name], [name] ([id number]) [id number] Page 5 of 7 Assessment Active Problems ICD-10 Venous insufficiency (chronic) (peripheral) Type 2 diabetes mellitus with other skin complications Malignant neoplasm of prostate Patient has no open wounds today. He does have evidence of venous insufficiency. I recommended he use lotion at night and wear his compression stockings daily. Follow-up as needed. Plan Discharge From [facility]: Discharge from [place] - Call if any future wound care needs. Edema Control - Lymphedema / SCD / Other: Elevate legs to the level of the heart or above for 30 minutes daily and/or when sitting, a frequency of: - 3-4 times a day throughout the day. Avoid standing for long periods of time. Patient to wear own compression stockings every day. - wear daily. Exercise regularly Moisturize legs daily. - every night before bed. Compression stocking or Garment 20-30 mm/Hg pressure to: - apply in the morning and remove at night. Other Edema Control Orders/Instructions: - office to apply tubigrip size D one layer to both legs. Once home remove and apply  your own stockings. 1. Compression stockings daily 2. Lotion at night 3. Follow-up as needed Electronic Signature(s) Signed: 10/15/2022 12:07:10 PM By: Kalman Shan DO Entered By: Kalman Shan on 10/15/2022 09:36:30 -------------------------------------------------------------------------------- HxROS Details Patient Name: Date of Service: Aaron Salon D. 10/15/2022 8:00 A M Medical Record Number: [id number] Patient Account Number: [id number] Date of Birth/Sex: Treating RN: 12-30-1932 (88 y.o. Hessie Diener Primary Care Provider: [name] Other Clinician: Referring Provider: Treating Provider/Extender: Neoma Laming Weeks in Treatment: 0 Information Obtained From Patient Respiratory Complaints and Symptoms: Positive for: Shortness of Breath Integumentary (Skin) Complaints and Symptoms: Positive for: Wounds - right lower leg Medical History: Negative for: History of Burn Past Medical History Notes: legs Musculoskeletal ANTWAINE, BOOMHOWER ([id number]) [id number] Page 6 of 7 Complaints and Symptoms: Positive for: Muscle Weakness Medical History: Positive for: Osteoarthritis Past Medical History Notes: arthritis Hematologic/Lymphatic Medical History: Positive for: Anemia Cardiovascular Medical History: Positive for: Arrhythmia - afib; Congestive Heart Failure; Coronary Artery Disease; Hypertension; Peripheral Arterial  Disease Past Medical History Notes: aortic valve replacement Gastrointestinal Medical History: Past Medical History Notes: GERD Endocrine Medical History: Positive for: Type II Diabetes Treated with: Oral agents Blood sugar tested every day: Yes Tested : Genitourinary Medical History: Past Medical History Notes: prostate cancer CKD stage II Neurologic Medical History: Positive for: Neuropathy Oncologic Medical History: Positive for: Received Chemotherapy; Received  Radiation Past Medical History Notes: prostate cancer Immunizations Pneumococcal Vaccine: Received Pneumococcal Vaccination: No Implantable Devices No devices added Family and Social History Cancer: Yes - Mother,Father,Siblings; Diabetes: Yes - Mother; Heart Disease: Yes - Father; Hereditary Spherocytosis: No; Hypertension: No; Kidney Disease: No; Lung Disease: No; Seizures: No; Stroke: Yes - Siblings; Thyroid Problems: No; Tuberculosis: No; Former smoker; Alcohol Use: Never; Drug Use: No History; Caffeine Use: Never; Financial Concerns: No; Food, Clothing or Shelter Needs: No; Support System Lacking: No; Transportation Concerns: No Electronic Signature(s) Signed: 10/15/2022 12:07:10 PM By: Kalman Shan DO Signed: 10/15/2022 4:43:40 PM By: Deon Pilling RN, BSN Signed: 10/21/2022 3:55:49 PM By: Sharyn Creamer RN, BSN Entered By: Sharyn Creamer on 10/15/2022 08:36:17 [name] ([id number]) [id number] Page 7 of 7 -------------------------------------------------------------------------------- SuperBill Details Patient Name: Date of Service: [name], [name] 10/15/2022 Medical Record Number: [id number] Patient Account Number: [id number] Date of Birth/Sex: Treating RN: November 24, 1932 (88 y.o. Hessie Diener Primary Care Provider: [name] Other Clinician: Referring Provider: Treating Provider/Extender: Neoma Laming Weeks in Treatment: 0 Diagnosis Coding ICD-10 Codes Code Description I87.2 Venous insufficiency (chronic) (peripheral) E11.628 Type 2 diabetes mellitus with other skin complications N86 Malignant neoplasm of prostate Facility Procedures : CPT4 Code: [id number] Description: 09628 - WOUND CARE VISIT-LEV 3 EST PT Modifier: Quantity: 1 Physician Procedures : CPT4 Code Description Modifier [id number] - WC PHYS LEVEL 3 - EST PT ICD-10 Diagnosis Description I87.2 Venous insufficiency (chronic)  (peripheral) E11.628 Type 2 diabetes mellitus with other skin complications K35 Malignant neoplasm of prostate Quantity: 1 Electronic Signature(s) Signed: 10/15/2022 12:07:10 PM By: Kalman Shan DO Entered By: Kalman Shan on 10/15/2022 09:36:48

## 2022-10-21 NOTE — Progress Notes (Signed)
JONTUE, CRUMPACKER [name] ([id number]) [id number] Page 1 of 4 Visit Report for 10/15/2022 Abuse Risk Screen Details Patient Name: Date of Service: [name], [name] 10/15/2022 8:00 A M Medical Record Number: [id number] Patient Account Number: [id number] Date of Birth/Sex: Treating RN: 05/18/1933 (88 y.o. Mare Ferrari Primary Care Rosaleah Person: [name] Other Clinician: Referring Lenita Peregrina: Treating Taeko Schaffer/Extender: Neoma Laming Weeks in Treatment: 0 Abuse Risk Screen Items Answer ABUSE RISK SCREEN: Has anyone close to you tried to hurt or harm you recentlyo No Do you feel uncomfortable with anyone in your familyo No Has anyone forced you do things that you didnt want to doo No Electronic Signature(s) Signed: 10/21/2022 3:55:49 PM By: Sharyn Creamer RN, BSN Entered By: Sharyn Creamer on 10/15/2022 08:36:28 -------------------------------------------------------------------------------- Activities of Daily Living Details Patient Name: Date of Service: Aaron Salon [name]. 10/15/2022 8:00 A M Medical Record Number: [id number] Patient Account Number: [id number] Date of Birth/Sex: Treating RN: [birth year] (88 y.o. Mare Ferrari Primary Care Jennifr Gaeta: [name] Other Clinician: Referring Asiana Benninger: Treating Anoushka Divito/Extender: Neoma Laming Weeks in Treatment: 0 Activities of Daily Living Items Answer Activities of Daily Living (Please select one for each item) Drive Automobile Not Able T Medications ake Need Assistance Use T elephone Completely Able Care for Appearance Completely Able Use T oilet Completely Able Bath / Shower Completely Able Dress Self Completely Able Feed Self Completely Able Walk Need Assistance Get In / Out Bed Need Assistance Housework Need Assistance Prepare Meals Need Assistance Handle Money Need Assistance Shop for Self Need Assistance Electronic  Signature(s) Signed: 10/21/2022 3:55:49 PM By: Sharyn Creamer RN, BSN Entered By: Sharyn Creamer on 10/15/2022 08:37:48 [name] ([id number]) [id number] Page 2 of 4 -------------------------------------------------------------------------------- Education Screening Details Patient Name: Date of Service: [name], [name] 10/15/2022 8:00 A M Medical Record Number: [id number] Patient Account Number: [id number] Date of Birth/Sex: Treating RN: [birth year] (88 y.o. Mare Ferrari Primary Care Tenleigh Byer: [name] Other Clinician: Referring [name] Potvin: Treating Josten Warmuth/Extender: Neoma Laming Weeks in Treatment: 0 Learning Preferences/Education Level/Primary Language Learning Preference: Explanation, Demonstration, Printed Material Preferred Language: English Cognitive Barrier Language Barrier: No Translator Needed: No Memory Deficit: No Emotional Barrier: No Cultural/Religious Beliefs Affecting Medical Care: No Physical Barrier Impaired Vision: No Impaired Hearing: No Decreased Hand dexterity: Yes Limitations: neuropathy Knowledge/Comprehension Knowledge Level: High Comprehension Level: High Ability to understand written instructions: High Ability to understand verbal instructions: High Motivation Anxiety Level: Calm Cooperation: Cooperative Education Importance: Acknowledges Need Interest in Health Problems: Asks Questions Perception: Coherent Willingness to Engage in Self-Management High Activities: Readiness to Engage in Self-Management High Activities: Electronic Signature(s) Signed: 10/21/2022 3:55:49 PM By: Sharyn Creamer RN, BSN Entered By: Sharyn Creamer on 10/15/2022 08:38:31 -------------------------------------------------------------------------------- Fall Risk Assessment Details Patient Name: Date of Service: Aaron Salon [name]. 10/15/2022 8:00 A M Medical Record Number:  [id number] Patient Account Number: [id number] Date of Birth/Sex: Treating RN: 1933-08-24 (89 y.o. Mare Ferrari Primary Care Sagal Gayton: [name] Other Clinician: Referring Lorree Millar: Treating Florence Yeung/Extender: Neoma Laming Weeks in Treatment: 0 Fall Risk Assessment Items Have you had 2 or more falls in the last 12 monthso 0 No Have you had any fall that resulted in injury in the last 12 monthso 0 No [address] [name], [name] ([id number]) [phone] [id number] Page 3 of 4 History of falling - immediate or within 3 months 0 No Secondary diagnosis (Do you have 2 or more medical diagnoseso) 15 Yes Ambulatory aid None/bed rest/wheelchair/nurse 0 No  Crutches/cane/walker 15 Yes Furniture 0 No Intravenous therapy Access/Saline/Heparin Lock 0 No Gait/Transferring Normal/ bed rest/ wheelchair 0 No Weak (short steps with or without shuffle, stooped but able to lift head while walking, may seek 10 Yes support from furniture) Impaired (short steps with shuffle, may have difficulty arising from chair, head down, impaired 0 No balance) Mental Status Oriented to own ability 0 Yes Electronic Signature(s) Signed: 10/21/2022 3:55:49 PM By: Sharyn Creamer RN, BSN Entered By: Sharyn Creamer on 10/15/2022 08:39:00 -------------------------------------------------------------------------------- Foot Assessment Details Patient Name: Date of Service: Aaron Salon [name]. 10/15/2022 8:00 A M Medical Record Number: [id number] Patient Account Number: [id number] Date of Birth/Sex: Treating RN: 03/11/1933 (88 y.o. Mare Ferrari Primary Care Diya Gervasi: [name] Other Clinician: Referring Florentine Diekman: Treating Fabyan Loughmiller/Extender: Neoma Laming Weeks in Treatment: 0 Foot Assessment Items Site Locations + = Sensation present, - = Sensation absent, C = Callus, U = Ulcer R = Redness, W = Warmth, M = Maceration, PU =  Pre-ulcerative lesion F = Fissure, S = Swelling, [name] = Dryness Assessment Right: Left: Other Deformity: No No Prior Foot Ulcer: No No Prior Amputation: No No Charcot Joint: No No Ambulatory Status: GaitKAIGE, [name] ([id number]) [id number] Page 4 of 4 Electronic Signature(s) Signed: 10/21/2022 3:55:49 PM By: Sharyn Creamer RN, BSN Entered By: Sharyn Creamer on 10/15/2022 08:42:13 -------------------------------------------------------------------------------- Nutrition Risk Screening Details Patient Name: Date of Service: Aaron Salon [name]. 10/15/2022 8:00 A M Medical Record Number: [id number] Patient Account Number: [id number] Date of Birth/Sex: Treating RN: [birth year] (88 y.o. Mare Ferrari Primary Care Millisa Giarrusso: [name] Other Clinician: Referring Avontae Burkhead: Treating Tyrique Sporn/Extender: Maurine Cane, Mahesh Weeks in Treatment: 0 Height (in): 68 Weight (lbs): 150 Body Mass Index (BMI): 22.8 Nutrition Risk Screening Items Score Screening NUTRITION RISK SCREEN: I have an illness or condition that made me change the kind and/or amount of food I eat 2 Yes I eat fewer than two meals per day 0 No I eat few fruits and vegetables, or milk products 0 No I have three or more drinks of beer, liquor or wine almost every day 0 No I have tooth or mouth problems that make it hard for me to eat 0 No I don't always have enough money to buy the food I need 0 No I eat alone most of the time 0 No I take three or more different prescribed or over-the-counter drugs a day 1 Yes Without wanting to, I have lost or gained 10 pounds in the last six months 2 Yes I am not always physically able to shop, cook and/or feed myself 0 No Nutrition Protocols Good Risk Protocol Moderate Risk Protocol High Risk Proctocol Risk Level: Moderate Risk Score: 5 Electronic Signature(s) Signed: 10/21/2022 3:55:49 PM By: Sharyn Creamer RN,  BSN Entered By: Sharyn Creamer on 10/15/2022 08:39:57

## 2022-10-21 NOTE — Progress Notes (Signed)
Progress Note  Patient Name: [name] Date of Encounter: 10/21/2022  Primary Cardiologist: [name], MD   Subjective   No events overnight.  Inpatient Medications    Scheduled Meds:  aspirin EC  81 mg Oral Daily   dapagliflozin propanediol  10 mg Oral Daily   enoxaparin (LOVENOX) injection  30 mg Subcutaneous Daily   ferrous sulfate  325 mg Oral BID WC   furosemide  80 mg Oral BID   insulin aspart  0-5 Units Subcutaneous QHS   insulin aspart  0-9 Units Subcutaneous TID WC   rosuvastatin  5 mg Oral Daily   Continuous Infusions:  PRN Meds: acetaminophen, hydrOXYzine   Vital Signs    Vitals:   10/20/22 1651 10/20/22 1913 10/21/22 0453 10/21/22 0741  BP: 112/68 117/61 123/67 138/70  Pulse: 82  92 97  Resp: 15 16 (!) 23 20  Temp: 97.8 F (36.6 C) (!) 97.4 F (36.3 C) 97.6 F (36.4 C) 97.8 F (36.6 C)  TempSrc: Oral Oral Oral Oral  SpO2: 96% 98% 97% 97%  Weight:   66.5 kg   Height:        Intake/Output Summary (Last 24 hours) at 10/21/2022 0930 Last data filed at 10/21/2022 0457 Gross per 24 hour  Intake 600 ml  Output 1875 ml  Net -1275 ml   Filed Weights   10/19/22 1631 10/20/22 0511 10/21/22 0453  Weight: 67.7 kg 67.3 kg 66.5 kg    Telemetry    AF with PVCs- Personally Reviewed  Physical Exam   Gen: Thin and chronically ill appearing   Neck: Severe JVD Cardiac: No Rubs or Gallops, systolic Murmur, IRIR Respiratory: Clear to auscultation bilaterally, normal effort, normal  respiratory rate GI: Soft, nontender, slightly distended MS: trace edema bilateral;  moves all extremities Neuro:  At time of evaluation, alert and oriented to person/place/time/situation  Psych: Normal affect, patient feels ok   Labs    Chemistry Recent Labs  Lab 10/19/22 0425 10/19/22 0615 10/20/22 0034 10/21/22 0052  NA 138  --  135 135  K 3.9  --  4.0 3.8  CL 107  --  102 101  CO2 19*  --  20* 22  GLUCOSE 86  --  126* 172*  BUN 64*   --  65* 59*  CREATININE 1.90*  --  1.88* 1.72*  CALCIUM 9.0  --  8.8* 8.7*  PROT  --  6.3*  --   --   ALBUMIN  --  3.1*  --   --   AST  --  35  --   --   ALT  --  17  --   --   ALKPHOS  --  193*  --   --   BILITOT  --  1.4*  --   --   GFRNONAA 34*  --  34* 38*  ANIONGAP 12  --  13 12     Hematology Recent Labs  Lab 10/18/22 1541 10/20/22 0034 10/21/22 0052  WBC 5.4 5.4 6.2  RBC 3.32* 3.35* 3.39*  HGB 10.6* 10.8* 11.1*  HCT 32.8* 31.9* 33.1*  MCV 98.8 95.2 97.6  MCH 31.9 32.2 32.7  MCHC 32.3 33.9 33.5  RDW 17.8* 18.2* 18.4*  PLT 158 155 166    Cardiac EnzymesNo results for input(s): "TROPONINI" in the last 168 hours. No results for input(s): "TROPIPOC" in the last 168 hours.   BNP Recent Labs  Lab 10/18/22 1541  BNP 1,918.3*  DDimer No results for input(s): "DDIMER" in the last 168 hours.   Radiology    ECHOCARDIOGRAM COMPLETE  Result Date: 10/19/2022    ECHOCARDIOGRAM REPORT   Patient Name:   AVISHAI REIHL Date of Exam: 10/19/2022 Medical Rec #:  [id number]         Height:       68.5 in Accession #:    [id number]        Weight:       154.2 lb Date of Birth:  May 09, 1933          BSA:          1.840 m Patient Age:    8 years          BP:           111/71 mmHg Patient Gender: M                 HR:           88 bpm. Exam Location:  Inpatient Procedure: 2D Echo Indications:    acute diastolic chf  History:        Patient has prior history of Echocardiogram examinations, most                 recent 03/02/2022. Chronic kidney disease, Arrythmias:Atrial                 Fibrillation, Signs/Symptoms:Dyspnea; Risk Factors:Diabetes and                 Dyslipidemia.                 Aortic Valve: 26 mm Sapien bioprosthetic valve is present in the                 aortic position.  Sonographer:    Johny Chess RDCS Referring Phys: [id number] [place]  1. Left ventricular ejection fraction, by estimation, is 45 to 50%. The left ventricle has mildly decreased  function. The left ventricle demonstrates global hypokinesis. Left ventricular diastolic parameters are indeterminate. There is the interventricular septum is flattened in systole and diastole, consistent with right ventricular pressure and volume overload.  2. Right ventricular systolic function is mildly reduced. The right ventricular size is moderately enlarged. Tricuspid regurgitation signal is inadequate for assessing PA pressure. The estimated right ventricular systolic pressure is 93.2 mmHg.  3. Left atrial size was severely dilated.  4. Right atrial size was severely dilated.  5. The mitral valve is grossly normal. Moderate mitral valve regurgitation. Moderate mitral annular calcification.  6. Tricuspid valve regurgitation is severe.  7. The aortic valve has been repaired/replaced. Aortic valve regurgitation is mild. There is a 26 mm Sapien bioprosthetic valve present in the aortic position. Aortic valve mean gradient measures 6.2 mmHg. Mild PVL unchanged from prior.  8. The inferior vena cava is dilated in size with <50% respiratory variability, suggesting right atrial pressure of 15 mmHg. Comparison(s): Prior images reviewed side by side. LVEF is decreased from prior (this study uses echo contrast). MR has improved. FINDINGS  Left Ventricle: Left ventricular ejection fraction, by estimation, is 45 to 50%. The left ventricle has mildly decreased function. The left ventricle demonstrates global hypokinesis. The left ventricular internal cavity size was normal in size. There is  no left ventricular hypertrophy. The interventricular septum is flattened in systole and diastole, consistent with right ventricular pressure and volume overload. Left ventricular diastolic parameters are indeterminate. Right Ventricle: The right ventricular size is moderately  enlarged. No increase in right ventricular wall thickness. Right ventricular systolic function is mildly reduced. Tricuspid regurgitation signal is inadequate  for assessing PA pressure. The tricuspid regurgitant velocity is 2.56 m/s, and with an assumed right atrial pressure of 15 mmHg, the estimated right ventricular systolic pressure is 61.9 mmHg. Left Atrium: Left atrial size was severely dilated. Right Atrium: Right atrial size was severely dilated. Pericardium: Trivial pericardial effusion is present. The pericardial effusion is anterior to the right ventricle. Mitral Valve: The mitral valve is grossly normal. Moderate mitral annular calcification. Moderate mitral valve regurgitation. Tricuspid Valve: The tricuspid valve is normal in structure. Tricuspid valve regurgitation is severe. No evidence of tricuspid stenosis. Aortic Valve: The aortic valve has been repaired/replaced. Aortic valve regurgitation is mild. Aortic valve mean gradient measures 6.2 mmHg. Aortic valve peak gradient measures 11.8 mmHg. Aortic valve area, by VTI measures 1.03 cm. There is a 26 mm Sapien bioprosthetic valve present in the aortic position. Pulmonic Valve: The pulmonic valve was normal in structure. Pulmonic valve regurgitation is not visualized. No evidence of pulmonic stenosis. Aorta: The aortic root and ascending aorta are structurally normal, with no evidence of dilitation. Venous: The inferior vena cava is dilated in size with less than 50% respiratory variability, suggesting right atrial pressure of 15 mmHg. IAS/Shunts: No atrial level shunt detected by color flow Doppler.  LEFT VENTRICLE PLAX 2D LVIDd:         4.80 cm LVIDs:         3.60 cm LV PW:         1.00 cm LV IVS:        1.00 cm LVOT diam:     1.70 cm LV SV:         25 LV SV Index:   14 LVOT Area:     2.27 cm  RIGHT VENTRICLE          IVC RV Basal diam:  3.50 cm  IVC diam: 2.70 cm TAPSE (M-mode): 1.3 cm LEFT ATRIUM              Index        RIGHT ATRIUM           Index LA diam:        4.80 cm  2.61 cm/m   RA Area:     28.30 cm LA Vol (A2C):   109.0 ml 59.24 ml/m  RA Volume:   89.20 ml  48.48 ml/m LA Vol (A4C):   80.4  ml  43.70 ml/m LA Biplane Vol: 94.7 ml  51.47 ml/m  AORTIC VALVE AV Area (Vmax):    1.04 cm AV Area (Vmean):   0.95 cm AV Area (VTI):     1.03 cm AV Vmax:           172.00 cm/s AV Vmean:          113.250 cm/s AV VTI:            0.242 m AV Peak Grad:      11.8 mmHg AV Mean Grad:      6.2 mmHg LVOT Vmax:         79.00 cm/s LVOT Vmean:        47.175 cm/s LVOT VTI:          0.110 m LVOT/AV VTI ratio: 0.46  AORTA Ao Asc diam: 2.90 cm MR Peak grad: 80.6 mmHg   TRICUSPID VALVE MR Mean grad: 51.0 mmHg   TR Peak grad:   26.2 mmHg  MR Vmax:      449.00 cm/s TR Vmax:        256.00 cm/s MR Vmean:     341.0 cm/s                           SHUNTS                           Systemic VTI:  0.11 m                           Systemic Diam: 1.70 cm Rudean Haskell MD Electronically signed by Rudean Haskell MD Signature Date/Time: 10/19/2022/11:26:57 AM    Final     Cardiac Studies   Cardiac Studies & Procedures       ECHOCARDIOGRAM  ECHOCARDIOGRAM COMPLETE 10/19/2022  Narrative ECHOCARDIOGRAM REPORT    Patient Name:   TYMEER VAQUERA Date of Exam: 10/19/2022 Medical Rec #:  [id number]         Height:       68.5 in Accession #:    [id number]        Weight:       154.2 lb Date of Birth:  08/16/1933          BSA:          1.840 m Patient Age:    77 years          BP:           111/71 mmHg Patient Gender: M                 HR:           88 bpm. Exam Location:  Inpatient  Procedure: 2D Echo  Indications:    acute diastolic chf  History:        Patient has prior history of Echocardiogram examinations, most recent 03/02/2022. Chronic kidney disease, Arrythmias:Atrial Fibrillation, Signs/Symptoms:Dyspnea; Risk Factors:Diabetes and Dyslipidemia. Aortic Valve: 26 mm Sapien bioprosthetic valve is present in the aortic position.  Sonographer:    Johny Chess RDCS Referring Phys: [id number] [place]   1. Left ventricular ejection fraction, by estimation, is 45 to 50%. The  left ventricle has mildly decreased function. The left ventricle demonstrates global hypokinesis. Left ventricular diastolic parameters are indeterminate. There is the interventricular septum is flattened in systole and diastole, consistent with right ventricular pressure and volume overload. 2. Right ventricular systolic function is mildly reduced. The right ventricular size is moderately enlarged. Tricuspid regurgitation signal is inadequate for assessing PA pressure. The estimated right ventricular systolic pressure is 81.0 mmHg. 3. Left atrial size was severely dilated. 4. Right atrial size was severely dilated. 5. The mitral valve is grossly normal. Moderate mitral valve regurgitation. Moderate mitral annular calcification. 6. Tricuspid valve regurgitation is severe. 7. The aortic valve has been repaired/replaced. Aortic valve regurgitation is mild. There is a 26 mm Sapien bioprosthetic valve present in the aortic position. Aortic valve mean gradient measures 6.2 mmHg. Mild PVL unchanged from prior. 8. The inferior vena cava is dilated in size with <50% respiratory variability, suggesting right atrial pressure of 15 mmHg.  Comparison(s): Prior images reviewed side by side. LVEF is decreased from prior (this study uses echo contrast). MR has improved.  FINDINGS Left Ventricle: Left ventricular ejection fraction, by estimation, is 45 to 50%. The left ventricle has mildly decreased function. The left ventricle  demonstrates global hypokinesis. The left ventricular internal cavity size was normal in size. There is no left ventricular hypertrophy. The interventricular septum is flattened in systole and diastole, consistent with right ventricular pressure and volume overload. Left ventricular diastolic parameters are indeterminate.  Right Ventricle: The right ventricular size is moderately enlarged. No increase in right ventricular wall thickness. Right ventricular systolic function is mildly reduced.  Tricuspid regurgitation signal is inadequate for assessing PA pressure. The tricuspid regurgitant velocity is 2.56 m/s, and with an assumed right atrial pressure of 15 mmHg, the estimated right ventricular systolic pressure is 96.2 mmHg.  Left Atrium: Left atrial size was severely dilated.  Right Atrium: Right atrial size was severely dilated.  Pericardium: Trivial pericardial effusion is present. The pericardial effusion is anterior to the right ventricle.  Mitral Valve: The mitral valve is grossly normal. Moderate mitral annular calcification. Moderate mitral valve regurgitation.  Tricuspid Valve: The tricuspid valve is normal in structure. Tricuspid valve regurgitation is severe. No evidence of tricuspid stenosis.  Aortic Valve: The aortic valve has been repaired/replaced. Aortic valve regurgitation is mild. Aortic valve mean gradient measures 6.2 mmHg. Aortic valve peak gradient measures 11.8 mmHg. Aortic valve area, by VTI measures 1.03 cm. There is a 26 mm Sapien bioprosthetic valve present in the aortic position.  Pulmonic Valve: The pulmonic valve was normal in structure. Pulmonic valve regurgitation is not visualized. No evidence of pulmonic stenosis.  Aorta: The aortic root and ascending aorta are structurally normal, with no evidence of dilitation.  Venous: The inferior vena cava is dilated in size with less than 50% respiratory variability, suggesting right atrial pressure of 15 mmHg.  IAS/Shunts: No atrial level shunt detected by color flow Doppler.   LEFT VENTRICLE PLAX 2D LVIDd:         4.80 cm LVIDs:         3.60 cm LV PW:         1.00 cm LV IVS:        1.00 cm LVOT diam:     1.70 cm LV SV:         25 LV SV Index:   14 LVOT Area:     2.27 cm   RIGHT VENTRICLE          IVC RV Basal diam:  3.50 cm  IVC diam: 2.70 cm TAPSE (M-mode): 1.3 cm  LEFT ATRIUM              Index        RIGHT ATRIUM           Index LA diam:        4.80 cm  2.61 cm/m   RA Area:      28.30 cm LA Vol (A2C):   109.0 ml 59.24 ml/m  RA Volume:   89.20 ml  48.48 ml/m LA Vol (A4C):   80.4 ml  43.70 ml/m LA Biplane Vol: 94.7 ml  51.47 ml/m AORTIC VALVE AV Area (Vmax):    1.04 cm AV Area (Vmean):   0.95 cm AV Area (VTI):     1.03 cm AV Vmax:           172.00 cm/s AV Vmean:          113.250 cm/s AV VTI:            0.242 m AV Peak Grad:      11.8 mmHg AV Mean Grad:      6.2 mmHg LVOT Vmax:  79.00 cm/s LVOT Vmean:        47.175 cm/s LVOT VTI:          0.110 m LVOT/AV VTI ratio: 0.46  AORTA Ao Asc diam: 2.90 cm  MR Peak grad: 80.6 mmHg   TRICUSPID VALVE MR Mean grad: 51.0 mmHg   TR Peak grad:   26.2 mmHg MR Vmax:      449.00 cm/s TR Vmax:        256.00 cm/s MR Vmean:     341.0 cm/s SHUNTS Systemic VTI:  0.11 m Systemic Diam: 1.70 cm  Rudean Haskell MD Electronically signed by Rudean Haskell MD Signature Date/Time: 10/19/2022/11:26:57 AM    Final     CT SCANS  CT CORONARY MORPH W/CTA COR W/SCORE 12/30/2012  Narrative *RADIOLOGY REPORT*  INDICATION: 88 year old male with history of severe aortic stenosis.  Preprocedural study for possible transcatheter aortic valve replacement (TAVR) procedure.  CT ANGIOGRAPHY OF THE HEART, CORONARY ARTERY, STRUCTURE, AND MORPHOLOGY  COMPARISON:  No priors.  CONTRAST: 37m OMNIPAQUE IOHEXOL 350 MG/ML SOLN  TECHNIQUE:  CT angiography of the coronary vessels was performed on a 256 channel system using prospective ECG gating.  A scout and ECG- gated noncontrast exam (for calcium scoring) were performed. Appropriate delay was determined by bolus tracking after injection of iodinated contrast, and an ECG-gated coronary CTA was performed with sub-mm slice collimation throughout the cardiac cycle. Imaging post processing was performed on an independent workstation creating multiplanar and 3-D images, allowing for quantitative analysis of the heart and coronary arteries.  Note that this  exam targets the heart and the chest was not imaged in its entirety.  PREMEDICATION: None.  FINDINGS: Technical quality: Good. Heart rate:  55-68 bpm (patient was in atrial fibrillation).  CORONARY ARTERIES: Assessment not performed secondary to the excessive imaging noise, small amount of motion related artifact (related to irregular rhythm) and high degree of coronary artery calcification.  The patient has had a recent catheter angiogram as well.  Aortic Root Measurements Pertinent to Potential TAVR Procedure:  Aortic Valve Description:  The aortic valve was tricuspid with severe thickening and calcification of all of the cusps.  During systole, there was limited valve opening with an estimated aortic valve area of approximately 0.84 cm2 by planimetry.  Aortic Valve Area: 0.84 cm-sq  Annulus: Planimetry - 5.6 cm2 Long & Short Axis - 31.0 x 25.2 mm Circumference - 87.3 mm  Sinuses of Valsalva: R SOV -     width 34.1 mm height 26.8 mm L SOV -     width 33.5 mm  height 20.3 mm Seth Ward SOV -    width 33.6 mm height 21.0 mm  Coronary Artery [place]: L main - 15.2 mm from the annulus RCA - 19.8 mm from the annulus  Ascending Aorta: 33.1 x 32.0 mm in diameter 4 cm above the annulus.  Potential Angiographic Angles: LAO 11 degrees, Caudal 7 degrees RAO 10 degrees, Caudal 28 degrees  OTHER AORTA AND PULMONARY MEASUREMENTS: Descending aorta:  (< 40 mm): 31 mm Main pulmonary artery: (< 30 mm): 28 mm  OTHER FINDINGS: Within the visualized portions of the thorax there are no suspicious appearing pulmonary nodules, areas of airspace consolidation or pleural effusions.  Mild diffuse bronchial wall thickening and some mild paraseptal emphysematous changes are noted. There is no significant pericardial fluid, thickening or pericardial calcification.  Numerous borderline enlarged and mildly enlarged mediastinal lymph nodes are noted, largest of which measures up to 1.3 cm in  short axis  in the subcarinal station. There are no aggressive appearing lytic or blastic lesions noted in the visualized portions of the skeleton.  IMPRESSION: 1.  Severely sclerotic aortic valve with an estimated aortic valve area of 0.84 cm2 by planimetry.  Findings and measurements pertinent to potential TAVR procedure, as detailed above. 2. Numerous borderline enlarged and mildly enlarged mediastinal lymph nodes are nonspecific.  Similar findings were seen in the abdomen and pelvis (see that report for discussion and details). Clinical correlation is recommended.  Another differential consideration would include an underlying lymphoproliferative disorder. 3.  Mild diffuse bronchial wall thickening with mild paraseptal emphysematous changes.   Original Report Authenticated By: Vinnie Langton, M.D.           Patient Profile     88 y.o. 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/p TAVR #AKI on CKD Likely Cardiorenal - patient has had structural evaluation  and patient is not a candidate for mitral or tricuspid valve structural interventions - holding BB unless AF RVR due to BP - lasix 80 mg PO BID for maintenance - Maintain K >4, Mg>2 - Farxiga - No MRA planned due to CKD  #Type II NSTEMI #CAD -Patient with elevated troponins in the setting of acute decompensated CHF and cardiorenal syndrome.  -Continue ASA 81 mg daily -Continue Crestor 5 mg daily -No ischemic evaluation planned due to CKD and frailty   #Permanent Afib with PVCs -He had failed AC due to anemia; on ASA not a Watchman Candidate -Holding metoprolol as described above with plans to restart when able - IV removed; bleeding stopped.  No further bleeding    #HTN #HLD -continue crestor   #PAD -ASA and statin   #Chronic Anemia -Recent iron studies  checked on 12/13 without IDA.     For questions or updates, please contact [facility] Please consult [email or web address] for contact info under Cardiology/STEMI.      Rudean Haskell, MD [facility] [address] [place], [place] [address] [phone]  9:30 AM

## 2022-10-21 NOTE — Progress Notes (Signed)
DECODA, VAN ([id number]) [id number] Page 1 of 6 Visit Report for 10/15/2022 Allergy List Details Patient Name: Date of Service: [name], [name] 10/15/2022 8:00 A M Medical Record Number: [id number] Patient Account Number: [id number] Date of Birth/Sex: Treating RN: [birth year] (88 y.o. Hessie Diener Primary Care Dietrick Barris: [name] Other Clinician: Referring Tinea Nobile: Treating Kharma Sampsel/Extender: Maurine Cane, Mahesh Weeks in Treatment: 0 Allergies Active Allergies amoxicillin Allergy Notes Electronic Signature(s) Signed: 10/21/2022 3:55:49 PM By: Sharyn Creamer RN, BSN Entered By: Sharyn Creamer on 10/15/2022 08:32:37 -------------------------------------------------------------------------------- Arrival Information Details Patient Name: Date of Service: Aaron Salon [name]. 10/15/2022 8:00 A M Medical Record Number: [id number] Patient Account Number: [id number] Date of Birth/Sex: Treating RN: [birth year] (88 y.o. Mare Ferrari Primary Care Lizza Huffaker: [name] Other Clinician: Referring Linnette Panella: Treating Kyler Lerette/Extender: Neoma Laming Weeks in Treatment: 0 Visit Information Patient Arrived: Wheel Chair Arrival Time: 08:29 Accompanied By: daughter Transfer Assistance: None Patient Identification Verified: Yes Secondary Verification Process Completed: Yes History Since Last Visit Added or deleted any medications: No Any new allergies or adverse reactions: No Had a fall or experienced change in activities of daily living that may affect risk of falls: No Signs or symptoms of abuse/neglect since last visito No Hospitalized since last visit: No Implantable device outside of the clinic excluding cellular tissue based products placed in the center since last visit: No Pain Present Now: No Electronic Signature(s) Signed: 10/21/2022 3:55:49 PM By: Sharyn Creamer RN, BSN Entered By:  Sharyn Creamer on 10/15/2022 08:31:00 [name] ([id number]) [id number] Page 2 of 6 -------------------------------------------------------------------------------- Clinic Level of Care Assessment Details Patient Name: Date of Service: [name], [name] 10/15/2022 8:00 A M Medical Record Number: [id number] Patient Account Number: [id number] Date of Birth/Sex: Treating RN: 04-19-1933 (88 y.o. Hessie Diener Primary Care Ugochi Henzler: [name] Other Clinician: Referring Evanie Buckle: Treating Debbi Strandberg/Extender: Neoma Laming Weeks in Treatment: 0 Clinic Level of Care Assessment Items TOOL 2 Quantity Score X- 1 0 Use when only an EandM is performed on the INITIAL visit ASSESSMENTS - Nursing Assessment / Reassessment X- 1 20 General Physical Exam (combine w/ comprehensive assessment (listed just below) when performed on new pt. evals) X- 1 25 Comprehensive Assessment (HX, ROS, Risk Assessments, Wounds Hx, etc.) ASSESSMENTS - Wound and Skin A ssessment / Reassessment '[]'$  - 0 Simple Wound Assessment / Reassessment - one wound '[]'$  - 0 Complex Wound Assessment / Reassessment - multiple wounds X- 1 10 Dermatologic / Skin Assessment (not related to wound area) ASSESSMENTS - Ostomy and/or Continence Assessment and Care '[]'$  - 0 Incontinence Assessment and Management '[]'$  - 0 Ostomy Care Assessment and Management (repouching, etc.) PROCESS - Coordination of Care X - Simple Patient / Family Education for ongoing care 1 15 '[]'$  - 0 Complex (extensive) Patient / Family Education for ongoing care X- 1 10 Staff obtains Programmer, systems, Records, T Results / Process Orders est '[]'$  - 0 Staff telephones HHA, Nursing Homes / Clarify orders / etc '[]'$  - 0 Routine Transfer to another Facility (non-emergent condition) '[]'$  - 0 Routine Hospital Admission (non-emergent condition) '[]'$  - 0 New Admissions / Biomedical engineer / Ordering NPWT Apligraf,  etc. , '[]'$  - 0 Emergency Hospital Admission (emergent condition) X- 1 10 Simple Discharge Coordination '[]'$  - 0 Complex (extensive) Discharge Coordination PROCESS - Special Needs '[]'$  - 0 Pediatric / Minor Patient Management '[]'$  - 0 Isolation Patient Management '[]'$  - 0 Hearing / Language / Visual special needs '[]'$  - 0 Assessment of Community  assistance (transportation, [name]/C planning, etc.) '[]'$  - 0 Additional assistance / Altered mentation '[]'$  - 0 Support Surface(s) Assessment (bed, cushion, seat, etc.) INTERVENTIONS - Wound Cleansing / Measurement '[]'$  - 0 Wound Imaging (photographs - any number of wounds) '[]'$  - 0 Wound Tracing (instead of photographs) '[]'$  - 0 Simple Wound Measurement - one wound '[]'$  - 0 Complex Wound Measurement - multiple wounds '[]'$  - 0 Simple Wound Cleansing - one wound '[]'$  - 0 Complex Wound Cleansing - multiple wounds INTERVENTIONS - Wound Dressings '[]'$  - 0 Small Wound Dressing one or multiple wounds [name], [name] ([id number]) [id number] Page 3 of 6 '[]'$  - 0 Medium Wound Dressing one or multiple wounds '[]'$  - 0 Large Wound Dressing one or multiple wounds '[]'$  - 0 Application of Medications - injection INTERVENTIONS - Miscellaneous '[]'$  - 0 External ear exam '[]'$  - 0 Specimen Collection (cultures, biopsies, blood, body fluids, etc.) '[]'$  - 0 Specimen(s) / Culture(s) sent or taken to Lab for analysis '[]'$  - 0 Patient Transfer (multiple staff / Civil Service fast streamer / Similar devices) '[]'$  - 0 Simple Staple / Suture removal (25 or less) '[]'$  - 0 Complex Staple / Suture removal (26 or more) '[]'$  - 0 Hypo / Hyperglycemic Management (close monitor of Blood Glucose) X- 1 15 Ankle / Brachial Index (ABI) - do not check if billed separately Has the patient been seen at the hospital within the last three years: Yes Total Score: 105 Level Of Care: New/Established - Level 3 Electronic Signature(s) Signed: 10/15/2022 4:43:40 PM By: Deon Pilling RN, BSN Entered  By: Deon Pilling on 10/15/2022 09:08:05 -------------------------------------------------------------------------------- Encounter Discharge Information Details Patient Name: Date of Service: Aaron Salon [name]. 10/15/2022 8:00 A M Medical Record Number: [id number] Patient Account Number: [id number] Date of Birth/Sex: Treating RN: 1933-04-06 (88 y.o. Hessie Diener Primary Care [place]: [name] Other Clinician: Referring Bernadett Milian: Treating Knute Mazzuca/Extender: Neoma Laming Weeks in Treatment: 0 Encounter Discharge Information Items Discharge Condition: Stable Ambulatory Status: Wheelchair Discharge Destination: Home Transportation: Other Accompanied By: daughter Schedule Follow-up Appointment: No Clinical Summary of Care: Notes tubigrip size [name] applied to both legs. Electronic Signature(s) Signed: 10/15/2022 4:43:40 PM By: Deon Pilling RN, BSN Entered By: Deon Pilling on 10/15/2022 09:08:37 -------------------------------------------------------------------------------- Lower Extremity Assessment Details Patient Name: Date of Service: Aaron Salon [name]. 10/15/2022 8:00 A M Medical Record Number: [id number] Patient Account Number: [id number] Date of Birth/Sex: Treating RN: 1933-04-23 (88 y.o. Joylene Igo, [address] [place] [name] ([id number]) [id number] Page 4 of 6 Primary Care Clay Menser: [name] Other Clinician: Referring Tearah Saulsbury: Treating Shane Melby/Extender: Maurine Cane, Mahesh Weeks in Treatment: 0 Edema Assessment Assessed: [Left: No] [Right: No] [Left: Edema] [Right: :] Calf Left: Right: Point of Measurement: 29 cm From Medial Instep 32.2 cm 31 cm Ankle Left: Right: Point of Measurement: 11 cm From Medial Instep 21 cm 20.7 cm Knee To Floor Left: Right: From Medial Instep 42 cm Vascular Assessment Pulses: Dorsalis Pedis Palpable: [Left:No] [Right:No] Doppler Audible: [Left:Yes]  [Right:Yes] Blood Pressure: Brachial: [Left:123] [Right:123] Electronic Signature(s) Signed: 10/21/2022 3:55:49 PM By: Sharyn Creamer RN, BSN Entered By: Sharyn Creamer on 10/15/2022 08:53:08 -------------------------------------------------------------------------------- Multi Wound Chart Details Patient Name: Date of Service: Aaron Salon [name]. 10/15/2022 8:00 A M Medical Record Number: [id number] Patient Account Number: [id number] Date of Birth/Sex: Treating RN: 05/14/1933 (88 y.o. M) Primary Care Makensie Mulhall: [name] Other Clinician: Referring Anona Giovannini: Treating Keamber Macfadden/Extender: Maurine Cane, Mahesh Weeks in Treatment: 0 Vital Signs Height(in): 68 Capillary Blood Glucose(mg/dl): 138 Weight(lbs): 150 Pulse(bpm): 90 Body  Mass Index(BMI): 22.8 Blood Pressure(mmHg): 123/64 Temperature(F): 97.7 Respiratory Rate(breaths/min): 20 [Treatment Notes:Wound Assessments Treatment Notes] Electronic Signature(s) Signed: 10/15/2022 12:07:10 PM By: Kalman Shan DO Entered By: Kalman Shan on 10/15/2022 09:32:55 Doylene Canning [name] ([id number]) [id number] Page 5 of 6 -------------------------------------------------------------------------------- Multi-Disciplinary Care Plan Details Patient Name: Date of Service: [name], [name] 10/15/2022 8:00 A M Medical Record Number: [id number] Patient Account Number: [id number] Date of Birth/Sex: Treating RN: November 27, 1932 (88 y.o. Hessie Diener Primary Care Hussam Muniz: [name] Other Clinician: Referring Miria Cappelli: Treating Laquilla Dault/Extender: Neoma Laming Weeks in Treatment: 0 Active Inactive Electronic Signature(s) Signed: 10/15/2022 4:43:40 PM By: Deon Pilling RN, BSN Entered By: Deon Pilling on 10/15/2022 09:08:54 -------------------------------------------------------------------------------- Pain Assessment Details Patient Name: Date of  Service: Aaron Salon [name]. 10/15/2022 8:00 A M Medical Record Number: [id number] Patient Account Number: [id number] Date of Birth/Sex: Treating RN: June 23, 1933 (88 y.o. Mare Ferrari Primary Care Deondre Marinaro: [name] Other Clinician: Referring Topanga Alvelo: Treating Robbi Spells/Extender: Neoma Laming Weeks in Treatment: 0 Active Problems Location of Pain Severity and Description of Pain Patient Has Paino No Site Locations Pain Management and Medication Current Pain Management: Electronic Signature(s) Signed: 10/21/2022 3:55:49 PM By: Sharyn Creamer RN, BSN Entered By: Sharyn Creamer on 10/15/2022 08:53:24 [name] ([id number]) [id number] Page 6 of 6 -------------------------------------------------------------------------------- Patient/Caregiver Education Details Patient Name: Date of Service: [name], [name] 12/12/2023andnbsp8:00 A M Medical Record Number: [id number] Patient Account Number: [id number] Date of Birth/Gender: Treating RN: 1933-10-03 (88 y.o. Hessie Diener Primary Care Physician: [name] Other Clinician: Referring Physician: Treating Physician/Extender: Neoma Laming Weeks in Treatment: 0 Education Assessment Education Provided To: Patient and Caregiver family member Education Topics Provided [place]: o Handouts: Welcome T [facility] o Methods: Explain/Verbal Responses: Reinforcements needed Electronic Signature(s) Signed: 10/15/2022 4:43:40 PM By: Deon Pilling RN, BSN Entered By: Deon Pilling on 10/15/2022 08:55:19 -------------------------------------------------------------------------------- Vitals Details Patient Name: Date of Service: Aaron Salon [name]. 10/15/2022 8:00 A M Medical Record Number: [id number] Patient Account Number: [id number] Date of Birth/Sex: Treating RN: 1932/12/10 (89 y.o. Mare Ferrari Primary Care Gelene Recktenwald: [name] Other Clinician: Referring Sherrell Weir: Treating Shan Padgett/Extender: Neoma Laming Weeks in Treatment: 0 Vital Signs Time Taken: 08:30 Temperature (F): 97.7 Height (in): 68 Pulse (bpm): 90 Source: Stated Respiratory Rate (breaths/min): 20 Weight (lbs): 150 Blood Pressure (mmHg): 123/64 Source: Stated Capillary Blood Glucose (mg/dl): 138 Body Mass Index (BMI): 22.8 Reference Range: 80 - 120 mg / dl Electronic Signature(s) Signed: 10/21/2022 3:55:49 PM By: Sharyn Creamer RN, BSN Entered By: Sharyn Creamer on 10/15/2022 08:32:32

## 2022-10-21 NOTE — Progress Notes (Signed)
Patient is discharged and discharged instructions revised with patient.  He will be taken to the Discharge lounge and await his daughter.  Pt stated he is continent and will be given a urinal for safety.

## 2022-10-21 NOTE — Plan of Care (Signed)

## 2022-10-21 NOTE — TOC Transition Note (Signed)
Transition of Care [facility]) - CM/SW Discharge Note   Patient Details  Name: [name] MRN: [id number] Date of Birth: 10-03-1933  [facility] of [facility] CM/SW Contact:  Zenon Mayo, RN Phone Number: 10/21/2022, 9:45 AM   Clinical Narrative:    Patient is for dc today, he is set up with [facility] for [facility] services. He has transport at dc.    Final next level of care: [place] Barriers to Discharge: No Barriers Identified   Patient Goals and CMS Choice Patient states their goals for this hospitalization and ongoing recovery are:: return home CMS Medicare.gov Compare Post Acute Care list provided to:: Patient Choice offered to / list presented to : Patient  Discharge Placement                       Discharge Plan and Services In-house Referral: NA Discharge Planning Services: CM Consult Post Acute Care Choice: Home Health          DME Arranged: N/A DME Agency: NA       HH Arranged: PT, OT [place] Agency: [place] Date [place]: 10/20/22 Time HH Agency Contacted: 49 Representative spoke with at [place]: [place] ([place]) Interventions     Readmission Risk Interventions    10/20/2022   11:46 AM  Readmission Risk Prevention Plan  HRI or Home Care Consult Complete  Palliative Care Screening Not Applicable  Medication Review (RN Care Manager) Complete

## 2022-10-21 NOTE — Discharge Summary (Signed)
Physician Discharge Summary   Patient: [name] MRN: [id number] DOB: 09/27/1933  Admit date:     10/18/2022  Discharge date: 10/21/22  Discharge Physician: Edwin Dada   PCP: [name], MD     Recommendations at discharge:  Follow up with Cardiology on Jan 3 Obtain BMP at lab appointment on Dec 20      Discharge Diagnoses: Principal Problem:   Acute on chronic combined systolic and diastolic CHF (congestive heart failure) ([place]) Active Problems:   Acute kidney injury superimposed on chronic kidney disease (HCC)   S/P aortic valve replacement with bioprosthetic valve   Type 2 diabetes mellitus with hyperlipidemia (HCC)   Essential hypertension   Protein-calorie malnutrition, severe   Iron deficiency anemia due to chronic blood loss   Elevated troponin   Hyperkalemia   Persistent atrial fibrillation (HCC)   Pressure injury of sacral region, stage 2 ([place])   Pulmonary hypertension (HCC)   Tricuspid regurgitation        Hospital Course: [name] is a 88 y.o. 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       * Acute on chronic combined systolic and diastolic CHF (congestive heart failure) Tricuspid regurgitation Aortic valve stenosis s/p TAVR Pulmonary hypertension Patient had advanced structural heart disease and worsening EF  He was admitted and started on high dose diuretics.  Cr remained stable and edema resolved.  Cardiology were consulted, and recommended continued Lasix twice daily (patietn had cut down to once daily) and close follow up.  MRA and BB held given low BP.      Acute kidney injury  superimposed on chronic kidney disease (HCC) Baseline creatinine 1.2-1.6.  Creatinine here 2.0, improved to 1.9 with diuresis, no improvement since.  Suspect this is his new baseline   Pressure injury of sacral region, stage 2 ([place]) Present on admssion   Persistent atrial fibrillation (HCC) Heart rate controlled.  Not on anticoagulation due to chronic blood loss anemia.  Protein-calorie malnutrition, severe As events by advanced heart failure and renal failure, as well as severe loss of subcutaneous muscle mass and fat diffusely             The [facility] Controlled Substances Registry was reviewed for this patient prior to discharge.  Consultants: Cardiology Procedures performed: Echocardiogram  Disposition: Home health Diet recommendation:  Cardiac diet  DISCHARGE MEDICATION: Allergies as of 10/21/2022       Reactions   Amoxicillin Other (See Comments)   Burning of mouth, tongue and lips.  itchy, burning throat        Medication List     STOP taking these medications    metoprolol succinate 25 MG 24 hr tablet Commonly known as: TOPROL-XL       TAKE these medications    acetaminophen 650 MG CR tablet Commonly known as: TYLENOL Take 650 mg by mouth daily before breakfast.   aspirin EC 81 MG tablet Take 1 tablet (81 mg total) by mouth daily. Swallow whole.   cetirizine 10 MG tablet Commonly known as: ZYRTEC Take 10 mg by mouth daily.   cholecalciferol 25 MCG (1000 UNIT) tablet Commonly known as: VITAMIN D3 Take 1,000 Units by mouth  2 (two) times daily with a meal.   dapagliflozin propanediol 10 MG Tabs tablet Commonly known as: FARXIGA Take 1 tablet (10 mg total) by mouth daily.   ferrous sulfate 325 (65 FE) MG tablet Take 1 tablet (325 mg total) by mouth 2 (two) times daily with a meal.   furosemide 40 MG tablet Commonly known as: LASIX Take 2 tablets (80 mg total) by mouth 2 (two) times daily. What changed: when to take this    gabapentin 100 MG capsule Commonly known as: Neurontin Take 1 capsule (100 mg total) by mouth at bedtime.   glipiZIDE 2.5 MG 24 hr tablet Commonly known as: GLUCOTROL XL Take 2.5 mg by mouth every morning.   Klor-Con M20 20 MEQ tablet Generic drug: potassium chloride SA Take 40 mEq by mouth 3 (three) times a week. Mon, wed, fri   metFORMIN 1000 MG tablet Commonly known as: GLUCOPHAGE Take 1 tablet (1,000 mg total) by mouth 2 (two) times daily with breakfast and lunch.   OXYGEN Inhale 2 L into the lungs as needed (shortness of breath).   pantoprazole 40 MG tablet Commonly known as: PROTONIX Take 1 tablet (40 mg total) by mouth 2 (two) times daily. Take  1 tab of 40 mg tab  by mouth twice a day till 02/16/22 then 1 tab of 40 mg by mouth daily starting 02/17/22   rosuvastatin 5 MG tablet Commonly known as: CRESTOR TAKE 1 TABLET BY MOUTH ONCE DAILY AT  6PM What changed: See the new instructions.   Vitamin A 3 MG (10000 UT) Tabs Take 1,000 mg by mouth daily at 6 (six) AM.   vitamin C 1000 MG tablet Take 1,000 mg by mouth daily.   Xtandi 40 MG capsule Generic drug: enzalutamide Take 160 mg by mouth daily.               Discharge Care Instructions  (From admission, onward)           Start     Ordered   10/21/22 0000  Discharge wound care:       Comments: Cover with a foam dressing Avoid direct pressure   10/21/22 0931            Follow-up Information     Swinyer, Lanice Schwab, NP Follow up on 11/06/2022.   Specialty: Cardiology Why: Appointment at 8:25 AM Contact information: [address] [place] [address] [phone]         [facility] Follow up.   Specialty: Home Health Services Why: Agency will call you to set up apt times Contact information: [place] [address] [phone]                 Discharge Instructions     Discharge instructions   Complete by: As directed     **IMPORTANT DISCHARGE INSTRUCTIONS**   From Dr. Loleta Books: You were admitted for fluid buildup  This is challenging to avoid, as your heart becomes weaker  Take furosemide 80 mg TWICE daily The medicine is called Lasix because it "lasts six hours" For that reason, you might be able to move your afternoon dose sooner in the day, like 3pm, so that you don't have to get up at night to pee  Continue your potassium  Resume your other home medicines but for now do not take metoprolol  Go get labs done on Wednesday as scheduled  Go see Christen Bame at Dr. Oralia Rud office  at the date listed below in the To Do section   Discharge wound care:   Complete by: As directed    Cover with a foam dressing Avoid direct pressure   Increase activity slowly   Complete by: As directed        Discharge Exam: Filed Weights   10/19/22 1631 10/20/22 0511 10/21/22 0453  Weight: 67.7 kg 67.3 kg 66.5 kg    General: Pt is alert, awake, not in acute distress, frail, sitting on edge of bed, kyphotic Cardiovascular: RRR, nl S1-S2, no murmurs appreciated.   No LE edema.  Chronic venous stasis change is severe. Respiratory: Normal respiratory rate and rhythm.  CTAB without rales or wheezes.  LUng restriction noted. Neuro/Psych: Strength symmetric in upper and lower extremities but severely weak and limited by joint pain.  Judgment and insight appear normal.   Condition at discharge: stable  The results of significant diagnostics from this hospitalization (including imaging, microbiology, ancillary and laboratory) are listed below for reference.   Imaging Studies: ECHOCARDIOGRAM COMPLETE  Result Date: 10/19/2022    ECHOCARDIOGRAM REPORT   Patient Name:   KIAN OTTAVIANO Date of Exam: 10/19/2022 Medical Rec #:  [id number]         Height:       68.5 in Accession #:    [id number]        Weight:       154.2 lb Date of Birth:  Sep 26, 1933          BSA:          1.840 m Patient Age:    37 years           BP:           111/71 mmHg Patient Gender: M                 HR:           88 bpm. Exam Location:  Inpatient Procedure: 2D Echo Indications:    acute diastolic chf  History:        Patient has prior history of Echocardiogram examinations, most                 recent 03/02/2022. Chronic kidney disease, Arrythmias:Atrial                 Fibrillation, Signs/Symptoms:Dyspnea; Risk Factors:Diabetes and                 Dyslipidemia.                 Aortic Valve: 26 mm Sapien bioprosthetic valve is present in the                 aortic position.  Sonographer:    Johny Chess RDCS Referring Phys: [id number] [place]  1. Left ventricular ejection fraction, by estimation, is 45 to 50%. The left ventricle has mildly decreased function. The left ventricle demonstrates global hypokinesis. Left ventricular diastolic parameters are indeterminate. There is the interventricular septum is flattened in systole and diastole, consistent with right ventricular pressure and volume overload.  2. Right ventricular systolic function is mildly reduced. The right ventricular size is moderately enlarged. Tricuspid regurgitation signal is inadequate for assessing PA pressure. The estimated right ventricular systolic pressure is 47.4 mmHg.  3. Left atrial size was severely dilated.  4. Right atrial size was severely dilated.  5. The mitral valve is grossly normal. Moderate mitral valve regurgitation. Moderate mitral annular calcification.  6.  Tricuspid valve regurgitation is severe.  7. The aortic valve has been repaired/replaced. Aortic valve regurgitation is mild. There is a 26 mm Sapien bioprosthetic valve present in the aortic position. Aortic valve mean gradient measures 6.2 mmHg. Mild PVL unchanged from prior.  8. The inferior vena cava is dilated in size with <50% respiratory variability, suggesting right atrial pressure of 15 mmHg. Comparison(s): Prior images reviewed side by side. LVEF is decreased from prior (this  study uses echo contrast). MR has improved. FINDINGS  Left Ventricle: Left ventricular ejection fraction, by estimation, is 45 to 50%. The left ventricle has mildly decreased function. The left ventricle demonstrates global hypokinesis. The left ventricular internal cavity size was normal in size. There is  no left ventricular hypertrophy. The interventricular septum is flattened in systole and diastole, consistent with right ventricular pressure and volume overload. Left ventricular diastolic parameters are indeterminate. Right Ventricle: The right ventricular size is moderately enlarged. No increase in right ventricular wall thickness. Right ventricular systolic function is mildly reduced. Tricuspid regurgitation signal is inadequate for assessing PA pressure. The tricuspid regurgitant velocity is 2.56 m/s, and with an assumed right atrial pressure of 15 mmHg, the estimated right ventricular systolic pressure is 42.6 mmHg. Left Atrium: Left atrial size was severely dilated. Right Atrium: Right atrial size was severely dilated. Pericardium: Trivial pericardial effusion is present. The pericardial effusion is anterior to the right ventricle. Mitral Valve: The mitral valve is grossly normal. Moderate mitral annular calcification. Moderate mitral valve regurgitation. Tricuspid Valve: The tricuspid valve is normal in structure. Tricuspid valve regurgitation is severe. No evidence of tricuspid stenosis. Aortic Valve: The aortic valve has been repaired/replaced. Aortic valve regurgitation is mild. Aortic valve mean gradient measures 6.2 mmHg. Aortic valve peak gradient measures 11.8 mmHg. Aortic valve area, by VTI measures 1.03 cm. There is a 26 mm Sapien bioprosthetic valve present in the aortic position. Pulmonic Valve: The pulmonic valve was normal in structure. Pulmonic valve regurgitation is not visualized. No evidence of pulmonic stenosis. Aorta: The aortic root and ascending aorta are structurally normal, with no  evidence of dilitation. Venous: The inferior vena cava is dilated in size with less than 50% respiratory variability, suggesting right atrial pressure of 15 mmHg. IAS/Shunts: No atrial level shunt detected by color flow Doppler.  LEFT VENTRICLE PLAX 2D LVIDd:         4.80 cm LVIDs:         3.60 cm LV PW:         1.00 cm LV IVS:        1.00 cm LVOT diam:     1.70 cm LV SV:         25 LV SV Index:   14 LVOT Area:     2.27 cm  RIGHT VENTRICLE          IVC RV Basal diam:  3.50 cm  IVC diam: 2.70 cm TAPSE (M-mode): 1.3 cm LEFT ATRIUM              Index        RIGHT ATRIUM           Index LA diam:        4.80 cm  2.61 cm/m   RA Area:     28.30 cm LA Vol (A2C):   109.0 ml 59.24 ml/m  RA Volume:   89.20 ml  48.48 ml/m LA Vol (A4C):   80.4 ml  43.70 ml/m LA Biplane Vol: 94.7 ml  51.47 ml/m  AORTIC VALVE AV Area (Vmax):    1.04 cm AV Area (Vmean):   0.95 cm AV Area (VTI):     1.03 cm AV Vmax:           172.00 cm/s AV Vmean:          113.250 cm/s AV VTI:            0.242 m AV Peak Grad:      11.8 mmHg AV Mean Grad:      6.2 mmHg LVOT Vmax:         79.00 cm/s LVOT Vmean:        47.175 cm/s LVOT VTI:          0.110 m LVOT/AV VTI ratio: 0.46  AORTA Ao Asc diam: 2.90 cm MR Peak grad: 80.6 mmHg   TRICUSPID VALVE MR Mean grad: 51.0 mmHg   TR Peak grad:   26.2 mmHg MR Vmax:      449.00 cm/s TR Vmax:        256.00 cm/s MR Vmean:     341.0 cm/s                           SHUNTS                           Systemic VTI:  0.11 m                           Systemic Diam: 1.70 cm Rudean Haskell MD Electronically signed by Rudean Haskell MD Signature Date/Time: 10/19/2022/11:26:57 AM    Final    DG Chest 2 View  Result Date: 10/18/2022 CLINICAL DATA:  Shortness of breath. EXAM: CHEST - 2 VIEW COMPARISON:  March 01, 2022. FINDINGS: Stable cardiomegaly. Both lungs are clear. The visualized skeletal structures are unremarkable. IMPRESSION: No active cardiopulmonary disease. Electronically Signed   By: Marijo Conception  M.D.   On: 10/18/2022 16:12   MR ABDOMEN MRCP W WO CONTAST  Result Date: 10/14/2022 CLINICAL DATA:  Characterize suspected liver lesions identified by prior CT, history of prostate cancer EXAM: MRI ABDOMEN WITHOUT AND WITH CONTRAST (INCLUDING MRCP) TECHNIQUE: Multiplanar multisequence MR imaging of the abdomen was performed both before and after the administration of intravenous contrast. Heavily T2-weighted images of the biliary and pancreatic ducts were obtained, and three-dimensional MRCP images were rendered by post processing. CONTRAST:  7.5 mL Vueway gadolinium contrast IV COMPARISON:  CT abdomen pelvis, 09/13/2022 FINDINGS: Lower chest: Small right pleural effusion. Cardiomegaly with hepatic vein reflux. Hepatobiliary: No solid liver abnormality is seen. Coarse, nodular contour of the liver. Intrinsic T2 hypointensity as well as signal inversion on in and opposed phase imaging consistent with hepatic iron deposition. No gallstones, gallbladder wall thickening, or biliary dilatation. Pancreas: Unremarkable. No pancreatic ductal dilatation or surrounding inflammatory changes. Spleen: Normal in size without significant abnormality. Adrenals/Urinary Tract: Adrenal glands are unremarkable. Simple, benign bilateral renal cortical cysts, for which no further follow-up or characterization is required. Kidneys are otherwise normal, without renal calculi, solid lesion, or hydronephrosis. Stomach/Bowel: Stomach is within normal limits. No evidence of bowel wall thickening, distention, or inflammatory changes. Vascular/Lymphatic: Aortic atherosclerosis. No enlarged abdominal lymph nodes. Other: No abdominal wall hernia or abnormality. Small volume perihepatic and perisplenic ascites. Musculoskeletal: No acute or significant osseous findings. IMPRESSION: 1. Coarse, nodular contour of the liver, suggestive of cirrhosis. 2. No focal  liver lesion or suspicious contrast enhancement to correspond to suspected findings of  prior CT 3. Hepatic iron deposition, a potential etiology of chronic liver disease. 4. Small volume ascites. 5. Small right pleural effusion. 6. Cardiomegaly with hepatic vein reflux suggesting elevated right heart pressure. Aortic Atherosclerosis (ICD10-I70.0). Electronically Signed   By: Delanna Ahmadi M.D.   On: 10/14/2022 21:23    Microbiology: Results for orders placed or performed during the hospital encounter of 03/01/22  Urine Culture     Status: Abnormal   Collection Time: 03/01/22  1:00 PM   Specimen: Urine, Clean Catch  Result Value Ref Range Status   Specimen Description URINE, CLEAN CATCH  Final   Special Requests NONE  Final   Culture (A)  Final    <10,000 COLONIES/mL INSIGNIFICANT GROWTH Performed at [facility], [address], [place], [place] [address]    Report Status 03/02/2022 FINAL  Final  Culture, blood (Routine X 2) w Reflex to ID Panel     Status: None   Collection Time: 03/01/22  6:42 PM   Specimen: BLOOD  Result Value Ref Range Status   Specimen Description BLOOD RIGHT ANTECUBITAL  Final   Special Requests   Final    BOTTLES DRAWN AEROBIC AND ANAEROBIC Blood Culture adequate volume   Culture   Final    NO GROWTH 5 DAYS Performed at [facility], [place] [address] [place], [place] [address]    Report Status 03/06/2022 FINAL  Final  Culture, blood (Routine X 2) w Reflex to ID Panel     Status: None   Collection Time: 03/01/22  6:42 PM   Specimen: BLOOD  Result Value Ref Range Status   Specimen Description BLOOD LEFT ANTECUBITAL  Final   Special Requests   Final    BOTTLES DRAWN AEROBIC AND ANAEROBIC Blood Culture adequate volume   Culture   Final    NO GROWTH 5 DAYS Performed at [facility], [place] [address], [place], [place] [address]    Report Status 03/06/2022 FINAL  Final    Labs: CBC: Recent Labs  Lab 10/16/22 1526 10/18/22 1541 10/20/22 0034 10/21/22 0052  WBC 5.6 5.4 5.4 6.2  NEUTROABS 3.8  --   --   --   HGB 10.5*  10.6* 10.8* 11.1*  HCT 31.6* 32.8* 31.9* 33.1*  MCV 98.1 98.8 95.2 97.6  PLT 168 158 155 338   Basic Metabolic Panel: Recent Labs  Lab 10/18/22 1541 10/19/22 0425 10/19/22 0615 10/20/22 0034 10/21/22 0052  NA 135 138  --  135 135  K 5.2* 3.9  --  4.0 3.8  CL 103 107  --  102 101  CO2 18* 19*  --  20* 22  GLUCOSE 167* 86  --  126* 172*  BUN 68* 64*  --  65* 59*  CREATININE 2.03* 1.90*  --  1.88* 1.72*  CALCIUM 9.1 9.0  --  8.8* 8.7*  MG  --   --  2.2  --  2.2   Liver Function Tests: Recent Labs  Lab 10/19/22 0615  AST 35  ALT 17  ALKPHOS 193*  BILITOT 1.4*  PROT 6.3*  ALBUMIN 3.1*   CBG: Recent Labs  Lab 10/20/22 0614 10/20/22 1143 10/20/22 1630 10/20/22 2102 10/21/22 0559  GLUCAP 116* 258* 215* 142* 239*    Discharge time spent: approximately 35 minutes spent on discharge counseling, evaluation of patient on day of discharge, and coordination of discharge planning with nursing, social  work, pharmacy and case management  Signed: Edwin Dada, MD [facility] 10/21/2022

## 2022-10-23 ENCOUNTER — Ambulatory Visit: Payer: Medicare Other | Attending: Internal Medicine

## 2022-10-23 DIAGNOSIS — I5032 Chronic diastolic (congestive) heart failure: Secondary | ICD-10-CM

## 2022-10-24 LAB — BASIC METABOLIC PANEL
BUN/Creatinine Ratio: 29 — ABNORMAL HIGH (ref 10–24)
BUN: 46 mg/dL — ABNORMAL HIGH (ref 8–27)
CO2: 21 mmol/L (ref 20–29)
Calcium: 9 mg/dL (ref 8.6–10.2)
Chloride: 98 mmol/L (ref 96–106)
Creatinine, Ser: 1.6 mg/dL — ABNORMAL HIGH (ref 0.76–1.27)
Glucose: 178 mg/dL — ABNORMAL HIGH (ref 70–99)
Potassium: 3.8 mmol/L (ref 3.5–5.2)
Sodium: 139 mmol/L (ref 134–144)
eGFR: 41 mL/min/{1.73_m2} — ABNORMAL LOW (ref >59)

## 2022-10-31 ENCOUNTER — Telehealth: Payer: Self-pay

## 2022-10-31 NOTE — Telephone Encounter (Signed)
-----   Message from [name], MD sent at 10/24/2022 12:45 PM EST ----- Results: Kidney function has improved despite higher dose diuretics Plan: Continue diuretic plan  [name], MD

## 2022-10-31 NOTE — Telephone Encounter (Signed)
The patient daughter has been notified of the result and verbalized understanding.  All questions (if any) were answered. Precious Gilding, RN 10/31/2022 1:56 PM   Daughter reports since leaving the hospital on 10/21/22 pt reports gains about 1 pound per day.  Pt does not write down daily weights.  Advised that it is very important to keep a log. Daughter expresses will try to keep a record. Wants to know if pt can take an extra dose of fluid pill for swelling. Reports is starting to notice swelling to legs.Wants to know if pt can take an extra dose of furosemide.  Denies SOB.   Advised MD is not in the office this week.  If SOB develops to call back if she has not heard from our office.  Will send to MD to address.

## 2022-11-01 DIAGNOSIS — S81801A Unspecified open wound, right lower leg, initial encounter: Secondary | ICD-10-CM | POA: Diagnosis not present

## 2022-11-01 DIAGNOSIS — E1140 Type 2 diabetes mellitus with diabetic neuropathy, unspecified: Secondary | ICD-10-CM | POA: Diagnosis not present

## 2022-11-01 DIAGNOSIS — I5032 Chronic diastolic (congestive) heart failure: Secondary | ICD-10-CM | POA: Diagnosis not present

## 2022-11-01 DIAGNOSIS — R296 Repeated falls: Secondary | ICD-10-CM | POA: Diagnosis not present

## 2022-11-01 NOTE — Progress Notes (Unsigned)
Cardiology Office Note:    Date:  11/01/2022   ID:  KARSYN ROCHIN, DOB [birth year], MRN [id number]  PCP:  [name], MD   [facility] Providers Cardiologist:  [name], MD { Click to update primary MD,subspecialty MD or APP then REFRESH:1}    Referring MD: [name], MD   Chief Complaint: ***  History of Present Illness:    DEYVI BONANNO is a *** 88 y.o. male with a hx of severe TR, significant MR, severe AAS s/p TAVR, chronic HFrEF, permanent atrial fibrillation, PVCs, HTN, HLD, chronic anemia, PAD followed by Dr. [name]. He is not on anticoagulation due to frequent falls.  He underwent TAVR with Edwards 26 mm Sapien bioprostethetic tissue valve via transapical approach on 01/19/2013 with Dr. Burt Knack.   Previously followed by Dr. Lovena Le for A-fib and Dr. [name] for PAD, he presented to Dr. Glenford Bayley in 2021.  Was found to have elevated BNP and was started on Farxiga and Lasix 40 mg daily -developed AKI.  Lasix dose reduced and [place] held with improvement of kidney function but with continued leg swelling.  10/23/2020 he resumed use of diuretics.  In the fall 2022 leg swelling had worsened and Norvasc was switched to verapamil.  He was referred to structural heart team for evaluation in June 2023 for worsening MR and TR.  Per their evaluation, he is not a candidate for procedural intervention.  Torsemide was tried for worsening LE edema but resulted in worsening AKI.  TTE 02/2022 with EF 50 to 55%, RV moderately reduced in setting of severe pulmonary hypertension secondary to severe MR, not a procedural candidate.  Last cardiology clinic visit was 10/04/2022 with Dr. Glenford Bayley at which time he reported no shortness of breath, chest pressure, PND, orthopnea.  His legs swelling was stable but he had new LE wounds. Daughter had reached out to PCP.  He appeared slightly hypervolemic on exam. Was advised to continue Lasix 60 mg twice daily, Farxiga 10 mg  daily, metoprolol XL 12.5 mg daily.  Admission 12/15-12/18/23 for worsening shortness of breath and weight gain.  Cardiology was consulted for acute on chronic combined systolic and diastolic CHF. Troponins were elevated in the setting of acute decompensated CHF and cardiorenal syndrome.  TTE 10/19/2022 revealed LVEF 45 to 50%, global HK of LV, indeterminate diastolic parameters, interventricular septum flattened in systole and diastole, consistent with right ventricular pressure and volume overload.  Mildly reduced RV function, moderately enlarged RV cavity, RVSP 41.2 mmHg, severe biatrial enlargement, moderate MR, severe TR, TAVR valve in position with mild PVL unchanged from prior echo, mild AI. LVEF decreased, and MR improved from prior echo. Beta-blocker was being held 2/2 hypotension and would be used if needed for AF with RVR. No ischemic evaluation was planned due to CKD and frailty. Additionally, is not a watchman candidate. Creatinine elevated to 2.0, improved to 1.9, suspect this is new baseline. He was discharged home 10/21/2022 with plan for home health.  Today, he is here   Past Medical History:  Diagnosis Date   Anemia    LOW PLATELETS OTHER DAY  PER PT   Anticoagulated on Coumadin 12/21/2012   Aortic stenosis    Arthritis    "left wrist; back sometimes" (01/21/2013)   Atrial fibrillation ([place])    [name], DR COOPER   Bradycardia    Exertional shortness of breath    Heart murmur    "I've had it for years; runs in the family on daddy's side" (  01/21/2013)   HTN (hypertension)    Hyperlipemia 12/21/2012   Prostate cancer ([place])    "had 40 tx of radiation in 2009" (01/21/2013)   S/P aortic valve replacement with bioprosthetic valve 01/19/2013   Transcatheter Aortic Valve Replacement using 41m Sapien bioprosthetic tissue valve via transapical approach   [place] II [place]     Past Surgical History:  Procedure Laterality Date   BALLOON DILATION N/A 08/02/2020    Procedure: BALLOON DILATION;  Surgeon: [name] MD;  Location: [facility];  Service: Gastroenterology;  Laterality: N/A;   BIOPSY  10/03/2021   Procedure: BIOPSY;  Surgeon: [name] MD;  Location: [facility];  Service: Endoscopy;;   BIOPSY  12/14/2021   Procedure: BIOPSY;  Surgeon: [name] MD;  [facility]  Service: Endoscopy;;   CARDIAC CATHETERIZATION  12/16/2012   CARDIAC VALVE REPLACEMENT  01/19/2013   AVR   CATARACT EXTRACTION W/ INTRAOCULAR LENS  IMPLANT, BILATERAL     ESOPHAGOGASTRODUODENOSCOPY N/A 10/03/2021   Procedure: ESOPHAGOGASTRODUODENOSCOPY (EGD);  Surgeon: [name] MD;  [facility];  Service: Endoscopy;  Laterality: N/A;   ESOPHAGOGASTRODUODENOSCOPY N/A 12/14/2021   Procedure: ESOPHAGOGASTRODUODENOSCOPY (EGD);  Surgeon: [name] MD;  [place] [facility]  Service: Endoscopy;  Laterality: N/A;   ESOPHAGOGASTRODUODENOSCOPY (EGD) WITH PROPOFOL N/A 08/02/2020   Procedure: ESOPHAGOGASTRODUODENOSCOPY (EGD) WITH PROPOFOL;  Surgeon: [name] MD;  [facility];  Service: Gastroenterology;  Laterality: N/A;   EYE SURGERY     INTRAOPERATIVE TRANSESOPHAGEAL ECHOCARDIOGRAM N/A 01/19/2013   Procedure: INTRAOPERATIVE TRANSESOPHAGEAL ECHOCARDIOGRAM;  Surgeon: CRexene Alberts MD;  [facility]  Service: Open Heart Surgery;  Laterality: N/A;   ORIF FOREARM FRACTURE Left 1954   "compound fx" (01/21/2013)   TRANSTHORACIC ECHOCARDIOGRAM  09/04/10, 09/07/08    Current Medications: No outpatient medications have been marked as taking for the 11/06/22 encounter (Appointment) with SAnn Maki MLanice Schwab NP.     Allergies:   Amoxicillin   Social History   Socioeconomic History   Marital status: Married    Spouse name: [name]  Number of children: 2   Years of education: Not on file   Highest education level: High school graduate  Occupational History   Occupation: Retired  Tobacco Use   Smoking status: Former     Packs/day: 2.00    Years: 22.00    Total pack years: 44.00    Types: Cigarettes    Quit date: 09/11/1976    Years since quitting: 46.1   Smokeless tobacco: Never  Vaping Use   Vaping Use: Never used  Substance and Sexual Activity   Alcohol use: Not on file   Drug use: No   Sexual activity: Never  Other Topics Concern   Not on file  Social History Narrative   Are you right handed or left handed? Right Handed    Are you currently employed ?    What is your current occupation?   Do you live at home alone?   Who lives with you?    What type of home do you live in: 1 story or 2 story? Lives in a one story home        Social Determinants of Health   Financial Resource Strain: Not on file  Food Insecurity: No Food Insecurity (10/19/2022)   Hunger Vital Sign    Worried About Running Out of Food in the Last Year: Never true    Ran Out of Food in the Last Year: Never true  Transportation Needs: No  Transportation Needs (10/19/2022)   PRAPARE - Hydrologist (Medical): No    Lack of Transportation (Non-Medical): No  Physical Activity: Not on file  Stress: Not on file  Social Connections: Not on file     Family History: The patient's ***family history includes Bone cancer (age of onset: 29) in his mother; Cancer (age of onset: 64) in his brother; Diabetes in his mother; Heart disease in his father; Melanoma (age of onset: 67) in his mother; Pancreatic cancer (age of onset: 62) in his father; Stroke in his sister.  ROS:   Please see the history of present illness.    *** All other systems reviewed and are negative.  Labs/Other Studies Reviewed:    The following studies were reviewed today:  Echo 10/19/22 1. Left ventricular ejection fraction, by estimation, is 45 to 50%. The  left ventricle has mildly decreased function. The left ventricle  demonstrates global hypokinesis. Left ventricular diastolic parameters are  indeterminate. There is the   interventricular septum is flattened in systole and diastole, consistent  with right ventricular pressure and volume overload.   2. Right ventricular systolic function is mildly reduced. The right  ventricular size is moderately enlarged. Tricuspid regurgitation signal is  inadequate for assessing PA pressure. The estimated right ventricular  systolic pressure is 26.8 mmHg.   3. Left atrial size was severely dilated.   4. Right atrial size was severely dilated.   5. The mitral valve is grossly normal. Moderate mitral valve  regurgitation. Moderate mitral annular calcification.   6. Tricuspid valve regurgitation is severe.   7. The aortic valve has been repaired/replaced. Aortic valve  regurgitation is mild. There is a 26 mm Sapien bioprosthetic valve present  in the aortic position. Aortic valve mean gradient measures 6.2 mmHg. Mild  PVL unchanged from prior.   8. The inferior vena cava is dilated in size with <50% respiratory  variability, suggesting right atrial pressure of 15 mmHg.   Comparison(s): Prior images reviewed side by side. LVEF is decreased from  prior (this study uses echo contrast). MR has improved.  Carotid Duplex 11/22/21 Right Carotid: Velocities in the right ICA are consistent with a 1-39%  stenosis.                Non-hemodynamically significant plaque <50% noted in the  CCA.   Left Carotid: Velocities in the left ICA are consistent with a 1-39%  stenosis.   Vertebrals: Bilateral vertebral arteries demonstrate antegrade flow.  Subclavians: Normal flow hemodynamics were seen in bilateral subclavian               arteries.   *See table(s) above for measurements and observations.  Suggest follow up study in 12 months due to plaque burden.     Recent Labs: 12/05/2021: NT-Pro BNP 6,343 03/01/2022: TSH 1.200 10/18/2022: B Natriuretic Peptide 1,918.3 10/19/2022: ALT 17 10/21/2022: Hemoglobin 11.1; Magnesium 2.2; Platelets 166 10/23/2022: BUN 46; Creatinine,  Ser 1.60; Potassium 3.8; Sodium 139  Recent Lipid Panel    Component Value Date/Time   CHOL 81 (L) 08/17/2018 0748   TRIG 82 08/17/2018 0748   HDL 28 (L) 08/17/2018 0748   CHOLHDL 2.9 08/17/2018 0748   LDLCALC 37 08/17/2018 0748     Risk Assessment/Calculations:   {Does this patient have ATRIAL FIBRILLATION?:[phone]}       Physical Exam:    VS:  There were no vitals taken for this visit.    Wt Readings from Last 3 Encounters:  10/21/22 146 lb 9.7 oz (66.5 kg)  10/16/22 154 lb 3.2 oz (69.9 kg)  10/04/22 148 lb (67.1 kg)     GEN: *** Well nourished, well developed in no acute distress HEENT: Normal NECK: No JVD; No carotid bruits CARDIAC: ***RRR, no murmurs, rubs, gallops RESPIRATORY:  Clear to auscultation without rales, wheezing or rhonchi  ABDOMEN: Soft, non-tender, non-distended MUSCULOSKELETAL:  No edema; No deformity. *** pedal pulses, ***bilaterally SKIN: Warm and dry NEUROLOGIC:  Alert and oriented x 3 PSYCHIATRIC:  Normal affect   EKG:  EKG is *** ordered today.  The ekg ordered today demonstrates ***  No BP recorded.  {Refresh Note OR Click here to enter BP  :1}***    Diagnoses:    No diagnosis found. Assessment and Plan:     Acute on chronic combined CHF/Pulmonary hypertension/RV failure: LVEF 45 to 50%, indeterminate diastolic parameters, flattened interventricular septum, RV moderately enlarged with mildly reduced RV function on echo 10/19/22.   Tricuspid/Mitral valve disease: Severe TR, moderate MR evaluated by structural heart team and felt not to be a candidate for intervention.   Severe AS s/p TAVR: History of TAVR 2014 with echo 10/19/22 that revealed normal valve function, mild AI, mild PVL unchanged from previous echo.   Hypotension:  Persistent atrial fibrillation: Not on OAC 2/2 anemia.   {Are you ordering a CV Procedure (e.g. stress test, cath, DCCV, TEE, etc)?   Press F2        :[id number]}   Disposition:  Medication  Adjustments/Labs and Tests Ordered: Current medicines are reviewed at length with the patient today.  Concerns regarding medicines are outlined above.  No orders of the defined types were placed in this encounter.  No orders of the defined types were placed in this encounter.   There are no Patient Instructions on file for this visit.   Signed, Emmaline Life, NP  11/01/2022 7:54 AM    [place]

## 2022-11-06 ENCOUNTER — Ambulatory Visit: Payer: Medicare Other | Attending: Nurse Practitioner | Admitting: Nurse Practitioner

## 2022-11-06 ENCOUNTER — Encounter: Payer: Self-pay | Admitting: Nurse Practitioner

## 2022-11-06 VITALS — BP 100/60 | HR 86 | Ht 68.5 in | Wt 157.0 lb

## 2022-11-06 DIAGNOSIS — D649 Anemia, unspecified: Secondary | ICD-10-CM | POA: Diagnosis not present

## 2022-11-06 DIAGNOSIS — Z953 Presence of xenogenic heart valve: Secondary | ICD-10-CM

## 2022-11-06 DIAGNOSIS — I5042 Chronic combined systolic (congestive) and diastolic (congestive) heart failure: Secondary | ICD-10-CM

## 2022-11-06 DIAGNOSIS — Z192 Hormone resistant malignancy status: Secondary | ICD-10-CM | POA: Diagnosis not present

## 2022-11-06 DIAGNOSIS — R935 Abnormal findings on diagnostic imaging of other abdominal regions, including retroperitoneum: Secondary | ICD-10-CM | POA: Diagnosis not present

## 2022-11-06 DIAGNOSIS — I482 Chronic atrial fibrillation, unspecified: Secondary | ICD-10-CM | POA: Diagnosis not present

## 2022-11-06 DIAGNOSIS — I5032 Chronic diastolic (congestive) heart failure: Secondary | ICD-10-CM | POA: Diagnosis not present

## 2022-11-06 LAB — BASIC METABOLIC PANEL
BUN/Creatinine Ratio: 30 — ABNORMAL HIGH (ref 10–24)
BUN: 50 mg/dL — ABNORMAL HIGH (ref 8–27)
CO2: 17 mmol/L — ABNORMAL LOW (ref 20–29)
Calcium: 9.1 mg/dL (ref 8.6–10.2)
Chloride: 99 mmol/L (ref 96–106)
Creatinine, Ser: 1.65 mg/dL — ABNORMAL HIGH (ref 0.76–1.27)
Glucose: 181 mg/dL — ABNORMAL HIGH (ref 70–99)
Potassium: 4.6 mmol/L (ref 3.5–5.2)
Sodium: 135 mmol/L (ref 134–144)
eGFR: 40 mL/min/{1.73_m2} — ABNORMAL LOW (ref >59)

## 2022-11-06 NOTE — Telephone Encounter (Signed)
Left a message to call back.

## 2022-11-06 NOTE — Patient Instructions (Signed)
Medication Instructions:   Your physician recommends that you continue on your current medications as directed. Please refer to the Current Medication list given to you today.   *If you need a refill on your cardiac medications before your next appointment, please call your pharmacy*   Lab Work:  TODAY!!!! BMET  If you have labs (blood work) drawn today and your tests are completely normal, you will receive your results only by: [place] (if you have MyChart) OR A paper copy in the mail If you have any lab test that is abnormal or we need to change your treatment, we will call you to review the results.   Testing/Procedures:  None ordered.   Follow-Up: At [facility], you and your health needs are our priority.  As part of our continuing mission to provide you with exceptional heart care, we have created designated Provider Care Teams.  These Care Teams include your primary Cardiologist (physician) and Advanced Practice Providers (APPs -  Physician Assistants and Nurse Practitioners) who all work together to provide you with the care you need, when you need it.  We recommend signing up for the patient portal called "MyChart".  Sign up information is provided on this After Visit Summary.  MyChart is used to connect with patients for Virtual Visits (Telemedicine).  Patients are able to view lab/test results, encounter notes, upcoming appointments, etc.  Non-urgent messages can be sent to your provider as well.   To learn more about what you can do with MyChart, go to [email or web address].    Your next appointment:   2 month(s)  The format for your next appointment:   In Person  Provider:   [name], MD     Important Information About Sugar

## 2022-11-06 NOTE — Telephone Encounter (Signed)
S/w pt's daughter per (DPR). Advised with Christen Bame, NP.  Pt is to keep current dose of lasix two (2) tablets by mouth ( 80 mg) bid.  Pt is to weigh daily.  If pt's wt increases 2 lbs in 24 hours or 5 lbs in one week pt is to take three (3) tablets by mouth (120 mg) bid for one day if weight does not decrease next day call office.

## 2022-11-08 ENCOUNTER — Ambulatory Visit (INDEPENDENT_AMBULATORY_CARE_PROVIDER_SITE_OTHER): Payer: Medicare Other | Admitting: Podiatry

## 2022-11-08 ENCOUNTER — Encounter: Payer: Self-pay | Admitting: Podiatry

## 2022-11-08 VITALS — BP 137/81

## 2022-11-08 DIAGNOSIS — E1142 Type 2 diabetes mellitus with diabetic polyneuropathy: Secondary | ICD-10-CM

## 2022-11-08 DIAGNOSIS — M79674 Pain in right toe(s): Secondary | ICD-10-CM | POA: Diagnosis not present

## 2022-11-08 DIAGNOSIS — B351 Tinea unguium: Secondary | ICD-10-CM | POA: Diagnosis not present

## 2022-11-08 DIAGNOSIS — M79675 Pain in left toe(s): Secondary | ICD-10-CM

## 2022-11-08 NOTE — Progress Notes (Unsigned)
  Subjective:  Patient ID: [name], male    DOB: 1933-06-15,  MRN: [id number]  [name] presents to clinic today for {jgcomplaint:23593}  Chief Complaint  Patient presents with   Nail Problem    DFC BS-86 A1C- do not know PCP-[facility]  PCP VST- last week    New problem(s): None. {jgcomplaint:23593}  PCP is [name], MD.  Allergies  Allergen Reactions   Amoxicillin Other (See Comments)    Burning of mouth, tongue and lips.  itchy, burning throat   Review of Systems: Negative except as noted in the HPI.  Objective: No changes noted in today's physical examination.  Vitals:   11/08/22 0830  BP: 137/81   [name] is a pleasant 89 y.o. male {jgbodyhabitus:24098} AAO x 3. Vascular Examination: CFT <4 seconds b/l LE. Faintly palpable DP pulses b/l LE. Diminished PT pulse(s) b/l LE. Pedal hair absent. No pain with calf compression b/l. Lower extremity skin temperature gradient within normal limits. Dependent edema noted b/l LE. Evidence of chronic venous insufficiency b/l LE. No ischemia or gangrene noted b/l LE. No cyanosis or clubbing noted b/l LE.  Dermatological Examination: Pedal skin thin, shiny and atrophic b/l LE. Dressings noted on left leg which are clean, dry and intact. Toenails right great toe and 2-5 b/l elongated, discolored, dystrophic, thickened, crumbly with subungual debris and tenderness to dorsal palpation.   There is noted onchyolysis of entire nailplate of left great toe.  The nailbed remains intact. There is no erythema, no edema, no drainage, no underlying fluctuance.   No hyperkeratotic nor porokeratotic lesions present on today's visit.  Neurological Examination: Pt has subjective symptoms of neuropathy. Protective sensation diminished with 10g monofilament b/l.  Musculoskeletal Examination: Noted disuse atrophy bilaterally. No pain, crepitus or joint limitation noted with ROM bilateral LE. Utilizes transport chair for  ambulation assistance.  Assessment/Plan: 1. Pain due to onychomycosis of toenails of both feet   2. Diabetic peripheral neuropathy associated with type 2 diabetes mellitus ([place])     No orders of the defined types were placed in this encounter.   None {Jgplan:23602::"-Patient/POA to call should there be question/concern in the interim."}   Return in about 3 months (around 02/07/2023).  Marzetta Board, DPM

## 2022-11-09 DIAGNOSIS — I5032 Chronic diastolic (congestive) heart failure: Secondary | ICD-10-CM | POA: Diagnosis not present

## 2022-11-09 DIAGNOSIS — E11319 Type 2 diabetes mellitus with unspecified diabetic retinopathy without macular edema: Secondary | ICD-10-CM | POA: Diagnosis not present

## 2022-11-09 DIAGNOSIS — I110 Hypertensive heart disease with heart failure: Secondary | ICD-10-CM | POA: Diagnosis not present

## 2022-11-09 DIAGNOSIS — S80922D Unspecified superficial injury of left lower leg, subsequent encounter: Secondary | ICD-10-CM | POA: Diagnosis not present

## 2022-11-09 DIAGNOSIS — I482 Chronic atrial fibrillation, unspecified: Secondary | ICD-10-CM | POA: Diagnosis not present

## 2022-11-09 DIAGNOSIS — D630 Anemia in neoplastic disease: Secondary | ICD-10-CM | POA: Diagnosis not present

## 2022-11-09 DIAGNOSIS — E7800 Pure hypercholesterolemia, unspecified: Secondary | ICD-10-CM | POA: Diagnosis not present

## 2022-11-09 DIAGNOSIS — S81801D Unspecified open wound, right lower leg, subsequent encounter: Secondary | ICD-10-CM | POA: Diagnosis not present

## 2022-11-09 DIAGNOSIS — K219 Gastro-esophageal reflux disease without esophagitis: Secondary | ICD-10-CM | POA: Diagnosis not present

## 2022-11-09 DIAGNOSIS — C775 Secondary and unspecified malignant neoplasm of intrapelvic lymph nodes: Secondary | ICD-10-CM | POA: Diagnosis not present

## 2022-11-09 DIAGNOSIS — E1140 Type 2 diabetes mellitus with diabetic neuropathy, unspecified: Secondary | ICD-10-CM | POA: Diagnosis not present

## 2022-11-12 DIAGNOSIS — E7800 Pure hypercholesterolemia, unspecified: Secondary | ICD-10-CM | POA: Diagnosis not present

## 2022-11-12 DIAGNOSIS — I110 Hypertensive heart disease with heart failure: Secondary | ICD-10-CM | POA: Diagnosis not present

## 2022-11-12 DIAGNOSIS — I5032 Chronic diastolic (congestive) heart failure: Secondary | ICD-10-CM | POA: Diagnosis not present

## 2022-11-12 DIAGNOSIS — S81801D Unspecified open wound, right lower leg, subsequent encounter: Secondary | ICD-10-CM | POA: Diagnosis not present

## 2022-11-12 DIAGNOSIS — S80922D Unspecified superficial injury of left lower leg, subsequent encounter: Secondary | ICD-10-CM | POA: Diagnosis not present

## 2022-11-13 ENCOUNTER — Telehealth: Payer: Self-pay | Admitting: Internal Medicine

## 2022-11-13 NOTE — Telephone Encounter (Signed)
   Pt c/o swelling: STAT is pt has developed SOB within 24 hours  If swelling, where is the swelling located? Legs   How much weight have you gained and in what time span? 30lbs in 3 weeks since hospital discharge   Have you gained 3 pounds in a day or 5 pounds in a week?   Do you have a log of your daily weights (if so, list)?   Are you currently taking a fluid pill? yes  Are you currently SOB? Yes with exertion but not any worse than normal   Have you traveled recently? No   [name] from [place] called in today stating pt has been swelling in his legs and has gained some weight since hospital discharge. Please advise

## 2022-11-14 MED ORDER — FUROSEMIDE 80 MG PO TABS: 120.0000 mg | ORAL_TABLET | Freq: Every day | ORAL | 3 refills | Status: AC

## 2022-11-14 NOTE — Telephone Encounter (Signed)
Called Beth advised of MD recommendation:  I had discussed with our team at his last visit that he may need 120 mg PO BID (see prior phone note, and APP note).  If he is taking that regimen, I would start him on metolazone 2.5 mg Q 48 hours and a BMP in one to two weeks.  If he is not taking that regimen, I would recommended it.  K was 4.6 at last evaluation.   Beth reports pt has 1-2+ edema to BLE.  Pt only taking 80 mg PO BID.  Will increase to 120 mg PO BID.  Beth will draw f/u BMP and fax results to Dr. Gasper Sells in 1-2 weeks.    Called pt daughter advised of MD recommendation.  She expresses understanding no questions or concerns voiced.

## 2022-11-15 DIAGNOSIS — K921 Melena: Secondary | ICD-10-CM | POA: Diagnosis not present

## 2022-11-18 ENCOUNTER — Telehealth: Payer: Self-pay | Admitting: Internal Medicine

## 2022-11-18 DIAGNOSIS — S81801D Unspecified open wound, right lower leg, subsequent encounter: Secondary | ICD-10-CM | POA: Diagnosis not present

## 2022-11-18 DIAGNOSIS — E7800 Pure hypercholesterolemia, unspecified: Secondary | ICD-10-CM | POA: Diagnosis not present

## 2022-11-18 DIAGNOSIS — S80922D Unspecified superficial injury of left lower leg, subsequent encounter: Secondary | ICD-10-CM | POA: Diagnosis not present

## 2022-11-18 DIAGNOSIS — I5032 Chronic diastolic (congestive) heart failure: Secondary | ICD-10-CM | POA: Diagnosis not present

## 2022-11-18 DIAGNOSIS — I110 Hypertensive heart disease with heart failure: Secondary | ICD-10-CM | POA: Diagnosis not present

## 2022-11-18 MED ORDER — METOLAZONE 2.5 MG PO TABS: 2.5000 mg | ORAL_TABLET | ORAL | 3 refills | Status: AC

## 2022-11-18 NOTE — Telephone Encounter (Signed)
Caller stated patient to start metolazone (ZAROXOLYN) 2.5 MG tablet and caller would like to know if the patient should continue the furosemide (LASIX) 80 MG tablet 1.5 tablet twice daily.

## 2022-11-18 NOTE — Telephone Encounter (Signed)
Called Beth advised pt is to continue taking furosemide 120 mg PO BID.  Take metolazone 2.5 mg PO QOD 30 min prior to Furosemide.  Beth reports pt daughter reports pt more sleepy than normal.  Advised that pt needs to see PCP.  Beth reports PCP office closed today will call first thing in the morning.  No further concerns at this time.

## 2022-11-18 NOTE — Telephone Encounter (Signed)
Pt daughter Venida Jarvis) reports 3 lb weight gain since  Friday and abdominal swelling.   1/12 159 lbs 1/13 162.4 lbs 1/14 162.6 lbs  Advised pt of MD recommendation:  I had discussed with our team at his last visit that he may need 120 mg PO BID (see prior phone note, and APP note).   If he is taking that regimen, I would start him on metolazone 2.5 mg Q 48 hours and a BMP in one to two weeks.   If he is not taking that regimen, I would recommended it.  K was 4.6 at last evaluation.   Pt will take metolazone 2.5 mg PO QOD and have BMP drawn in 1-2 weeks. Called Beth with [facility]  updated on treatment plan. She expresses understanding and will draw follow up labs.

## 2022-11-18 NOTE — Addendum Note (Signed)
Addended by: Precious Gilding on: 11/18/2022 09:37 AM   Modules accepted: Orders

## 2022-11-18 NOTE — Telephone Encounter (Signed)
Pt's daughter is calling back stating that pt seems to not be getting better with increase in medication and would like a call back to discuss.

## 2022-11-21 ENCOUNTER — Telehealth: Payer: Self-pay | Admitting: Internal Medicine

## 2022-11-21 DIAGNOSIS — S81801D Unspecified open wound, right lower leg, subsequent encounter: Secondary | ICD-10-CM | POA: Diagnosis not present

## 2022-11-21 DIAGNOSIS — I5032 Chronic diastolic (congestive) heart failure: Secondary | ICD-10-CM | POA: Diagnosis not present

## 2022-11-21 DIAGNOSIS — S80922D Unspecified superficial injury of left lower leg, subsequent encounter: Secondary | ICD-10-CM | POA: Diagnosis not present

## 2022-11-21 DIAGNOSIS — I110 Hypertensive heart disease with heart failure: Secondary | ICD-10-CM | POA: Diagnosis not present

## 2022-11-21 DIAGNOSIS — E7800 Pure hypercholesterolemia, unspecified: Secondary | ICD-10-CM | POA: Diagnosis not present

## 2022-11-21 NOTE — Telephone Encounter (Signed)
Called [facility] nurse.  Reports pt has only lost 1 lb since furosemide increased to 120 mg PO BID and the addition of metolazone 2.5 mg PO QOD.  Pt had a fall denies hitting head.  Has a skin tear to arm and is more sleepy.  Pt still has leg, abdominal and back swelling.   Labs drawn today.  [phone].  Reports pt refuses ED visit.  Called pt daughter Venida Jarvis.  Reports pt is more sleepy than normal.  Noticed over the weekend.  Breathing is labored with activity but okay when still.  Reports is taking meds as ordered.  Only urinates 2-3 times per day.  PO intake is hard to determine pt leaves bottles everywhere.  Daughter will try to be more intentional with measuring PO intake.  Suggested ED visit daughter reports pt will not go.  Expresses pt will tell her if he feels he needs ED.   Will send to MD to advise.

## 2022-11-21 NOTE — Telephone Encounter (Signed)
Beth from [place] [facility] is calling to report she saw the pt today. The blood work was drawn that was requested by our office. She reports the swelling is a little better in the patients legs. Weight is down by a pound, but the patient reports he is only urinating 2-3x a day. The patient was tachycardiac during the visit, HR 112 and irregular. Patient is very sleepy, daughter reports increased sleepiness. Patient had a fall yesterday and is very weak. Callback number left is secure for a VM to be left.

## 2022-11-22 ENCOUNTER — Ambulatory Visit (HOSPITAL_COMMUNITY): Admission: RE | Admit: 2022-11-22 | Payer: Medicare Other | Source: Ambulatory Visit

## 2022-11-22 ENCOUNTER — Inpatient Hospital Stay (HOSPITAL_COMMUNITY)
Admission: EM | Admit: 2022-11-22 | Discharge: 2022-12-05 | DRG: 291 | Disposition: E | Payer: Medicare Other | Attending: Family Medicine | Admitting: Family Medicine

## 2022-11-22 ENCOUNTER — Emergency Department (HOSPITAL_COMMUNITY): Payer: Medicare Other

## 2022-11-22 ENCOUNTER — Other Ambulatory Visit (HOSPITAL_COMMUNITY): Payer: Self-pay | Admitting: Cardiovascular Disease

## 2022-11-22 DIAGNOSIS — Z923 Personal history of irradiation: Secondary | ICD-10-CM

## 2022-11-22 DIAGNOSIS — I509 Heart failure, unspecified: Secondary | ICD-10-CM

## 2022-11-22 DIAGNOSIS — Z743 Need for continuous supervision: Secondary | ICD-10-CM | POA: Diagnosis not present

## 2022-11-22 DIAGNOSIS — R296 Repeated falls: Secondary | ICD-10-CM | POA: Diagnosis not present

## 2022-11-22 DIAGNOSIS — Z7982 Long term (current) use of aspirin: Secondary | ICD-10-CM

## 2022-11-22 DIAGNOSIS — I482 Chronic atrial fibrillation, unspecified: Secondary | ICD-10-CM | POA: Diagnosis not present

## 2022-11-22 DIAGNOSIS — R54 Age-related physical debility: Secondary | ICD-10-CM | POA: Diagnosis present

## 2022-11-22 DIAGNOSIS — I10 Essential (primary) hypertension: Secondary | ICD-10-CM | POA: Diagnosis not present

## 2022-11-22 DIAGNOSIS — Z9181 History of falling: Secondary | ICD-10-CM

## 2022-11-22 DIAGNOSIS — I071 Rheumatic tricuspid insufficiency: Secondary | ICD-10-CM | POA: Diagnosis present

## 2022-11-22 DIAGNOSIS — L89152 Pressure ulcer of sacral region, stage 2: Secondary | ICD-10-CM | POA: Diagnosis present

## 2022-11-22 DIAGNOSIS — Z66 Do not resuscitate: Secondary | ICD-10-CM | POA: Diagnosis not present

## 2022-11-22 DIAGNOSIS — T502X5A Adverse effect of carbonic-anhydrase inhibitors, benzothiadiazides and other diuretics, initial encounter: Secondary | ICD-10-CM | POA: Diagnosis present

## 2022-11-22 DIAGNOSIS — Z79899 Other long term (current) drug therapy: Secondary | ICD-10-CM

## 2022-11-22 DIAGNOSIS — D500 Iron deficiency anemia secondary to blood loss (chronic): Secondary | ICD-10-CM | POA: Diagnosis not present

## 2022-11-22 DIAGNOSIS — I5033 Acute on chronic diastolic (congestive) heart failure: Secondary | ICD-10-CM | POA: Diagnosis not present

## 2022-11-22 DIAGNOSIS — N179 Acute kidney failure, unspecified: Secondary | ICD-10-CM | POA: Diagnosis not present

## 2022-11-22 DIAGNOSIS — I499 Cardiac arrhythmia, unspecified: Secondary | ICD-10-CM | POA: Diagnosis not present

## 2022-11-22 DIAGNOSIS — Z961 Presence of intraocular lens: Secondary | ICD-10-CM | POA: Diagnosis present

## 2022-11-22 DIAGNOSIS — E871 Hypo-osmolality and hyponatremia: Secondary | ICD-10-CM | POA: Diagnosis not present

## 2022-11-22 DIAGNOSIS — R579 Shock, unspecified: Secondary | ICD-10-CM | POA: Diagnosis not present

## 2022-11-22 DIAGNOSIS — R5383 Other fatigue: Secondary | ICD-10-CM | POA: Diagnosis not present

## 2022-11-22 DIAGNOSIS — I517 Cardiomegaly: Secondary | ICD-10-CM | POA: Diagnosis not present

## 2022-11-22 DIAGNOSIS — I4819 Other persistent atrial fibrillation: Secondary | ICD-10-CM | POA: Diagnosis present

## 2022-11-22 DIAGNOSIS — Z8546 Personal history of malignant neoplasm of prostate: Secondary | ICD-10-CM

## 2022-11-22 DIAGNOSIS — E1142 Type 2 diabetes mellitus with diabetic polyneuropathy: Secondary | ICD-10-CM | POA: Diagnosis present

## 2022-11-22 DIAGNOSIS — Z7984 Long term (current) use of oral hypoglycemic drugs: Secondary | ICD-10-CM

## 2022-11-22 DIAGNOSIS — Z515 Encounter for palliative care: Secondary | ICD-10-CM | POA: Diagnosis not present

## 2022-11-22 DIAGNOSIS — I272 Pulmonary hypertension, unspecified: Secondary | ICD-10-CM | POA: Diagnosis present

## 2022-11-22 DIAGNOSIS — I4821 Permanent atrial fibrillation: Secondary | ICD-10-CM | POA: Diagnosis not present

## 2022-11-22 DIAGNOSIS — D689 Coagulation defect, unspecified: Secondary | ICD-10-CM | POA: Diagnosis present

## 2022-11-22 DIAGNOSIS — Z9842 Cataract extraction status, left eye: Secondary | ICD-10-CM

## 2022-11-22 DIAGNOSIS — I739 Peripheral vascular disease, unspecified: Secondary | ICD-10-CM | POA: Diagnosis present

## 2022-11-22 DIAGNOSIS — N1832 Chronic kidney disease, stage 3b: Secondary | ICD-10-CM | POA: Diagnosis not present

## 2022-11-22 DIAGNOSIS — Z953 Presence of xenogenic heart valve: Secondary | ICD-10-CM | POA: Diagnosis not present

## 2022-11-22 DIAGNOSIS — Z7189 Other specified counseling: Secondary | ICD-10-CM | POA: Diagnosis not present

## 2022-11-22 DIAGNOSIS — R609 Edema, unspecified: Secondary | ICD-10-CM | POA: Diagnosis not present

## 2022-11-22 DIAGNOSIS — I779 Disorder of arteries and arterioles, unspecified: Secondary | ICD-10-CM

## 2022-11-22 DIAGNOSIS — E1140 Type 2 diabetes mellitus with diabetic neuropathy, unspecified: Secondary | ICD-10-CM | POA: Diagnosis present

## 2022-11-22 DIAGNOSIS — E872 Acidosis, unspecified: Secondary | ICD-10-CM | POA: Diagnosis present

## 2022-11-22 DIAGNOSIS — R109 Unspecified abdominal pain: Secondary | ICD-10-CM | POA: Diagnosis not present

## 2022-11-22 DIAGNOSIS — E1151 Type 2 diabetes mellitus with diabetic peripheral angiopathy without gangrene: Secondary | ICD-10-CM | POA: Diagnosis not present

## 2022-11-22 DIAGNOSIS — E43 Unspecified severe protein-calorie malnutrition: Secondary | ICD-10-CM | POA: Diagnosis present

## 2022-11-22 DIAGNOSIS — R0602 Shortness of breath: Secondary | ICD-10-CM | POA: Diagnosis present

## 2022-11-22 DIAGNOSIS — Z8249 Family history of ischemic heart disease and other diseases of the circulatory system: Secondary | ICD-10-CM

## 2022-11-22 DIAGNOSIS — Z6823 Body mass index (BMI) 23.0-23.9, adult: Secondary | ICD-10-CM

## 2022-11-22 DIAGNOSIS — Z7901 Long term (current) use of anticoagulants: Secondary | ICD-10-CM

## 2022-11-22 DIAGNOSIS — R011 Cardiac murmur, unspecified: Secondary | ICD-10-CM | POA: Diagnosis present

## 2022-11-22 DIAGNOSIS — M479 Spondylosis, unspecified: Secondary | ICD-10-CM | POA: Diagnosis present

## 2022-11-22 DIAGNOSIS — M19032 Primary osteoarthritis, left wrist: Secondary | ICD-10-CM | POA: Diagnosis present

## 2022-11-22 DIAGNOSIS — Z9981 Dependence on supplemental oxygen: Secondary | ICD-10-CM

## 2022-11-22 DIAGNOSIS — R531 Weakness: Secondary | ICD-10-CM | POA: Diagnosis not present

## 2022-11-22 DIAGNOSIS — Z800 Family history of malignant neoplasm of digestive organs: Secondary | ICD-10-CM

## 2022-11-22 DIAGNOSIS — E1122 Type 2 diabetes mellitus with diabetic chronic kidney disease: Secondary | ICD-10-CM | POA: Diagnosis present

## 2022-11-22 DIAGNOSIS — E46 Unspecified protein-calorie malnutrition: Secondary | ICD-10-CM | POA: Diagnosis present

## 2022-11-22 DIAGNOSIS — Z87891 Personal history of nicotine dependence: Secondary | ICD-10-CM

## 2022-11-22 DIAGNOSIS — R627 Adult failure to thrive: Secondary | ICD-10-CM | POA: Diagnosis present

## 2022-11-22 DIAGNOSIS — E785 Hyperlipidemia, unspecified: Secondary | ICD-10-CM | POA: Diagnosis present

## 2022-11-22 DIAGNOSIS — I340 Nonrheumatic mitral (valve) insufficiency: Secondary | ICD-10-CM | POA: Diagnosis not present

## 2022-11-22 DIAGNOSIS — Z9841 Cataract extraction status, right eye: Secondary | ICD-10-CM

## 2022-11-22 DIAGNOSIS — E440 Moderate protein-calorie malnutrition: Secondary | ICD-10-CM

## 2022-11-22 DIAGNOSIS — I130 Hypertensive heart and chronic kidney disease with heart failure and stage 1 through stage 4 chronic kidney disease, or unspecified chronic kidney disease: Principal | ICD-10-CM | POA: Diagnosis present

## 2022-11-22 DIAGNOSIS — D6869 Other thrombophilia: Secondary | ICD-10-CM | POA: Diagnosis present

## 2022-11-22 DIAGNOSIS — K7460 Unspecified cirrhosis of liver: Secondary | ICD-10-CM | POA: Diagnosis not present

## 2022-11-22 DIAGNOSIS — I110 Hypertensive heart disease with heart failure: Secondary | ICD-10-CM | POA: Diagnosis not present

## 2022-11-22 DIAGNOSIS — I5043 Acute on chronic combined systolic (congestive) and diastolic (congestive) heart failure: Secondary | ICD-10-CM | POA: Diagnosis present

## 2022-11-22 DIAGNOSIS — R680 Hypothermia, not associated with low environmental temperature: Secondary | ICD-10-CM | POA: Diagnosis not present

## 2022-11-22 DIAGNOSIS — I5032 Chronic diastolic (congestive) heart failure: Secondary | ICD-10-CM | POA: Diagnosis not present

## 2022-11-22 DIAGNOSIS — Z808 Family history of malignant neoplasm of other organs or systems: Secondary | ICD-10-CM

## 2022-11-22 DIAGNOSIS — J9610 Chronic respiratory failure, unspecified whether with hypoxia or hypercapnia: Secondary | ICD-10-CM | POA: Diagnosis not present

## 2022-11-22 DIAGNOSIS — R5381 Other malaise: Secondary | ICD-10-CM

## 2022-11-22 DIAGNOSIS — E878 Other disorders of electrolyte and fluid balance, not elsewhere classified: Secondary | ICD-10-CM | POA: Diagnosis present

## 2022-11-22 DIAGNOSIS — R570 Cardiogenic shock: Secondary | ICD-10-CM | POA: Diagnosis not present

## 2022-11-22 DIAGNOSIS — Z833 Family history of diabetes mellitus: Secondary | ICD-10-CM

## 2022-11-22 DIAGNOSIS — I083 Combined rheumatic disorders of mitral, aortic and tricuspid valves: Secondary | ICD-10-CM | POA: Diagnosis present

## 2022-11-22 LAB — URINALYSIS, ROUTINE W REFLEX MICROSCOPIC
Bilirubin Urine: NEGATIVE
Glucose, UA: 150 mg/dL — AB
Hgb urine dipstick: NEGATIVE
Ketones, ur: NEGATIVE mg/dL
Leukocytes,Ua: NEGATIVE
Nitrite: NEGATIVE
Protein, ur: 30 mg/dL — AB
Specific Gravity, Urine: 1.01 (ref 1.005–1.030)
pH: 5 (ref 5.0–8.0)

## 2022-11-22 LAB — BRAIN NATRIURETIC PEPTIDE: B Natriuretic Peptide: 1471 pg/mL — ABNORMAL HIGH (ref 0.0–100.0)

## 2022-11-22 LAB — LACTIC ACID, PLASMA
Lactic Acid, Venous: 4.3 mmol/L (ref 0.5–1.9)
Lactic Acid, Venous: 2.6 mmol/L (ref 0.5–1.9)

## 2022-11-22 LAB — CBC WITH DIFFERENTIAL/PLATELET
Abs Immature Granulocytes: 0.03 10*3/uL (ref 0.00–0.07)
Basophils Absolute: 0 10*3/uL (ref 0.0–0.1)
Basophils Relative: 1 %
Eosinophils Absolute: 0 10*3/uL (ref 0.0–0.5)
Eosinophils Relative: 0 %
HCT: 28.9 % — ABNORMAL LOW (ref 39.0–52.0)
Hemoglobin: 10.5 g/dL — ABNORMAL LOW (ref 13.0–17.0)
Immature Granulocytes: 0 %
Lymphocytes Relative: 5 %
Lymphs Abs: 0.4 10*3/uL — ABNORMAL LOW (ref 0.7–4.0)
MCH: 32.2 pg (ref 26.0–34.0)
MCHC: 36.3 g/dL — ABNORMAL HIGH (ref 30.0–36.0)
MCV: 88.7 fL (ref 80.0–100.0)
Monocytes Absolute: 1 10*3/uL (ref 0.1–1.0)
Monocytes Relative: 12 %
Neutro Abs: 6.7 10*3/uL (ref 1.7–7.7)
Neutrophils Relative %: 82 %
Platelets: 215 10*3/uL (ref 150–400)
RBC: 3.26 MIL/uL — ABNORMAL LOW (ref 4.22–5.81)
RDW: 15.9 % — ABNORMAL HIGH (ref 11.5–15.5)
WBC: 8.2 10*3/uL (ref 4.0–10.5)
nRBC: 0 % (ref 0.0–0.2)

## 2022-11-22 LAB — COMPREHENSIVE METABOLIC PANEL
ALT: 14 U/L (ref 0–44)
AST: 47 U/L — ABNORMAL HIGH (ref 15–41)
Albumin: 2.8 g/dL — ABNORMAL LOW (ref 3.5–5.0)
Alkaline Phosphatase: 123 U/L (ref 38–126)
Anion gap: 19 — ABNORMAL HIGH (ref 5–15)
BUN: 72 mg/dL — ABNORMAL HIGH (ref 8–23)
CO2: 15 mmol/L — ABNORMAL LOW (ref 22–32)
Calcium: 8.4 mg/dL — ABNORMAL LOW (ref 8.9–10.3)
Chloride: 92 mmol/L — ABNORMAL LOW (ref 98–111)
Creatinine, Ser: 2.02 mg/dL — ABNORMAL HIGH (ref 0.61–1.24)
GFR, Estimated: 31 mL/min — ABNORMAL LOW (ref >60)
Glucose, Bld: 100 mg/dL — ABNORMAL HIGH (ref 70–99)
Potassium: 3.5 mmol/L (ref 3.5–5.1)
Sodium: 126 mmol/L — ABNORMAL LOW (ref 135–145)
Total Bilirubin: 3 mg/dL — ABNORMAL HIGH (ref 0.3–1.2)
Total Protein: 6.2 g/dL — ABNORMAL LOW (ref 6.5–8.1)

## 2022-11-22 LAB — PROTIME-INR
INR: 2.5 — ABNORMAL HIGH (ref 0.8–1.2)
Prothrombin Time: 26.4 seconds — ABNORMAL HIGH (ref 11.4–15.2)

## 2022-11-22 MED ORDER — MORPHINE SULFATE (PF) 2 MG/ML IV SOLN
1.0000 mg | INTRAVENOUS | Status: DC | PRN
  Administered 2022-11-23 (×2): 1 mg via INTRAVENOUS
  Filled 2022-11-22 (×3): qty 1

## 2022-11-22 MED ORDER — ACETAMINOPHEN 325 MG PO TABS
650.0000 mg | ORAL_TABLET | ORAL | Status: DC | PRN
  Administered 2022-11-23: 650 mg via ORAL
  Filled 2022-11-22: qty 2

## 2022-11-22 MED ORDER — ONDANSETRON HCL 4 MG/2ML IJ SOLN: 4.0000 mg | Freq: Four times a day (QID) | INTRAMUSCULAR | Status: DC | PRN

## 2022-11-22 MED ORDER — SODIUM CHLORIDE 0.9 % IV SOLN: 250.0000 mL | INTRAVENOUS | Status: DC | PRN

## 2022-11-22 MED ORDER — FUROSEMIDE 10 MG/ML IJ SOLN: 40.0000 mg | Freq: Two times a day (BID) | INTRAMUSCULAR | Status: DC

## 2022-11-22 MED ORDER — HEPARIN SODIUM (PORCINE) 5000 UNIT/ML IJ SOLN
5000.0000 [IU] | Freq: Three times a day (TID) | INTRAMUSCULAR | Status: DC
  Administered 2022-11-23 (×4): 5000 [IU] via SUBCUTANEOUS
  Filled 2022-11-22 (×4): qty 1

## 2022-11-22 MED ORDER — SODIUM CHLORIDE 0.9% FLUSH: 3.0000 mL | INTRAVENOUS | Status: DC | PRN

## 2022-11-22 MED ORDER — SODIUM CHLORIDE 0.9% FLUSH
3.0000 mL | Freq: Two times a day (BID) | INTRAVENOUS | Status: DC
  Administered 2022-11-23 – 2022-11-24 (×4): 3 mL via INTRAVENOUS

## 2022-11-22 NOTE — Telephone Encounter (Signed)
Sherri, pt's daughter is calling back wanting to know if the labs Landry Corporal drew have been received and if so she would like to discuss the results. Please advise.

## 2022-11-22 NOTE — Telephone Encounter (Signed)
Patient is currently in ED>

## 2022-11-22 NOTE — ED Provider Notes (Signed)
[facility] Provider Note   CSN: [id number] Arrival date & time: 11/28/2022  1435     History  Chief Complaint  Patient presents with   Altered Mental Status    [name] is a 89 y.o. male.   Altered Mental Status Patient presents with shortness of breath fatigue decreased appetite.  Been in the hospital recently for similar symptoms.  Discharged about a month ago.  Is somewhat dwindled down since.  Now worse over the last week.  Reportedly would walk with a walker at baseline but now would be able to do may be a step with 1.  Decreased oral intake.  Increase swelling on his legs to the point that he is weeping.  Home health nurse reportedly thought there could be infection.  Unknown weight at home.  History of CHF.  Also states his rear end hurts.  Has a history of radiation burns after prostate cancer treatment    Past Medical History:  Diagnosis Date   Anemia    LOW PLATELETS OTHER DAY  PER PT   Anticoagulated on Coumadin 12/21/2012   Aortic stenosis    Arthritis    "left wrist; back sometimes" (01/21/2013)   Atrial fibrillation ([place])    [name], DR COOPER   Bradycardia    Exertional shortness of breath    Heart murmur    "I've had it for years; runs in the family on daddy's side" (01/21/2013)   HTN (hypertension)    Hyperlipemia 12/21/2012   Prostate cancer ([place])    "had 40 tx of radiation in 2009" (01/21/2013)   S/P aortic valve replacement with bioprosthetic valve 01/19/2013   Transcatheter Aortic Valve Replacement using 78m Sapien bioprosthetic tissue valve via transapical approach   Type II diabetes mellitus ([place]     Home Medications Prior to Admission medications   Medication Sig Start Date End Date Taking? Authorizing Provider  acetaminophen (TYLENOL) 650 MG CR tablet Take 650 mg by mouth daily before breakfast.    [provider]  Ascorbic Acid (VITAMIN C) 1000 MG tablet Take 1,000 mg by mouth  daily.    [provider]  aspirin EC 81 MG tablet Take 1 tablet (81 mg total) by mouth daily. Swallow whole. 04/01/22   Medina-Vargas, Monina C, NP  cetirizine (ZYRTEC) 10 MG tablet Take 10 mg by mouth daily.    [provider]  cholecalciferol (VITAMIN D3) 25 MCG (1000 UNIT) tablet Take 1,000 Units by mouth 2 (two) times daily with a meal.    [provider]  dapagliflozin propanediol (FARXIGA) 10 MG TABS tablet Take 1 tablet (10 mg total) by mouth daily. 04/01/22   Medina-Vargas, Monina C, NP  ferrous sulfate 325 (65 FE) MG tablet Take 1 tablet (325 mg total) by mouth 2 (two) times daily with a meal. 04/01/22   Medina-Vargas, Monina C, NP  furosemide (LASIX) 80 MG tablet Take 1.5 tablets (120 mg total) by mouth daily. 11/14/22   Chandrasekhar, MTerisa Starr MD  gabapentin (NEURONTIN) 100 MG capsule Take 1 capsule (100 mg total) by mouth at bedtime. 09/23/22   Patel, Donika K, DO  glipiZIDE (GLUCOTROL XL) 2.5 MG 24 hr tablet Take 2.5 mg by mouth every morning. 07/19/22   [provider]  KLOR-CON M20 20 MEQ tablet Take 40 mEq by mouth 3 (three) times a week. [place], fri 08/21/22   [provider]  metFORMIN (GLUCOPHAGE) 1000 MG tablet Take 1 tablet (1,000 mg  total) by mouth 2 (two) times daily with breakfast and lunch. 04/01/22   Medina-Vargas, Monina C, NP  metolazone (ZAROXOLYN) 2.5 MG tablet Take 1 tablet (2.5 mg total) by mouth every other day. 11/18/22   [name], MD  OXYGEN Inhale 2 L into the lungs as needed (shortness of breath).    [provider]  pantoprazole (PROTONIX) 40 MG tablet Take 1 tablet (40 mg total) by mouth 2 (two) times daily. Take  1 tab of 40 mg tab  by mouth twice a day till 02/16/22 then 1 tab of 40 mg by mouth daily starting 02/17/22 04/01/22   Medina-Vargas, Monina C, NP  rosuvastatin (CRESTOR) 5 MG tablet TAKE 1 TABLET BY MOUTH ONCE DAILY AT  6PM 09/03/22   Elgie Collard, PA-C  Vitamin A 3 MG (10000 UT) TABS  Take 1,000 mg by mouth daily at 6 (six) AM.    [provider]  XTANDI 40 MG capsule Take 160 mg by mouth daily. 09/13/22   [provider]      Allergies    Amoxicillin    Review of Systems   Review of Systems  Physical Exam Updated Vital Signs BP 103/68   Pulse (!) 105   Temp (!) 97.5 F (36.4 C) (Oral)   Resp (!) 25   SpO2 95%  Physical Exam Vitals and nursing note reviewed.  Eyes:     Pupils: Pupils are equal, round, and reactive to light.  Cardiovascular:     Rate and Rhythm: Regular rhythm.     Heart sounds: Murmur heard.  Pulmonary:     Breath sounds: No wheezing or rhonchi.  Abdominal:     General: There is distension.  Musculoskeletal:        General: No tenderness.     Cervical back: Neck supple.     Right lower leg: Edema present.     Left lower leg: Edema present.     Comments: Weeping to bilateral lower extremities.  No warmth.  Neurological:     Mental Status: He is alert and oriented to person, place, and time.        ED Results / Procedures / Treatments   Labs (all labs ordered are listed, but only abnormal results are displayed) Labs Reviewed  COMPREHENSIVE METABOLIC PANEL - Abnormal; Notable for the following components:      Result Value   Sodium 126 (*)    Chloride 92 (*)    CO2 15 (*)    Glucose, Bld 100 (*)    BUN 72 (*)    Creatinine, Ser 2.02 (*)    Calcium 8.4 (*)    Total Protein 6.2 (*)    Albumin 2.8 (*)    AST 47 (*)    Total Bilirubin 3.0 (*)    GFR, Estimated 31 (*)    Anion gap 19 (*)    All other components within normal limits  LACTIC ACID, PLASMA - Abnormal; Notable for the following components:   Lactic Acid, Venous 4.3 (*)    All other components within normal limits  LACTIC ACID, PLASMA - Abnormal; Notable for the following components:   Lactic Acid, Venous 2.6 (*)    All other components within normal limits  CBC WITH DIFFERENTIAL/PLATELET - Abnormal; Notable for the following components:    RBC 3.26 (*)    Hemoglobin 10.5 (*)    HCT 28.9 (*)    MCHC 36.3 (*)    RDW 15.9 (*)  Lymphs Abs 0.4 (*)    All other components within normal limits  PROTIME-INR - Abnormal; Notable for the following components:   Prothrombin Time 26.4 (*)    INR 2.5 (*)    All other components within normal limits  URINALYSIS, ROUTINE W REFLEX MICROSCOPIC - Abnormal; Notable for the following components:   APPearance HAZY (*)    Glucose, UA 150 (*)    Protein, ur 30 (*)    Bacteria, UA RARE (*)    All other components within normal limits  BRAIN NATRIURETIC PEPTIDE - Abnormal; Notable for the following components:   B Natriuretic Peptide 1,471.0 (*)    All other components within normal limits  CULTURE, BLOOD (ROUTINE X 2)  CULTURE, BLOOD (ROUTINE X 2)    EKG EKG Interpretation  Date/Time:  Friday November 22 2022 16:38:56 EST Ventricular Rate:  106 PR Interval:  156 QRS Duration: 124 QT Interval:  402 QTC Calculation: 534 R Axis:   -63 Text Interpretation: Atrial fibrillation Left bundle branch block Confirmed by Davonna Belling [phone]) on 11/07/2022 4:50:39 PM  Radiology DG Chest 2 View  Result Date: 12/01/2022 CLINICAL DATA:  Possible sepsis. EXAM: CHEST - 2 VIEW COMPARISON:  Chest x-ray dated October 18, 2022. FINDINGS: Stable cardiomegaly status post TAVR. No focal consolidation, pleural effusion, or pneumothorax. No acute osseous abnormality. IMPRESSION: No active cardiopulmonary disease. Electronically Signed   By: Titus Dubin M.D.   On: 11/04/2022 15:37    Procedures Procedures    Medications Ordered in ED Medications - No data to display  ED Course/ Medical Decision Making/ A&P                             Medical Decision Making Amount and/or Complexity of Data Reviewed Labs: ordered. Radiology: ordered.   Patient presents with weakness.  Decreased oral intake.  Increasing edema of legs.  History of CHF.  History of failure to thrive.  Decreased oral intake  but increasing wounds.  Differential diagnosis includes cellulitis, CHF, chronic A-fib. X-ray done reassuring.  Will get more blood work.  Will likely require admission to hospital or generalized weakness.  Reviewed recent admission notes.   Lactic acid initial elevated above 4.  Has decreased to 2.6.  I think this is not secondary to infection.  I think likely somewhat hypoperfusing with the CHF.  Normal white count.  Afebrile.  Lower extremity wounds I think a more just edematous as opposed to infection.  Urine does not show infection.  Will discuss with hospitalist for admission.  CRITICAL CARE Performed by: Davonna Belling Total critical care time: 30 minutes Critical care time was exclusive of separately billable procedures and treating other patients. Critical care was necessary to treat or prevent imminent or life-threatening deterioration. Critical care was time spent personally by me on the following activities: development of treatment plan with patient and/or surrogate as well as nursing, discussions with consultants, evaluation of patient's response to treatment, examination of patient, obtaining history from patient or surrogate, ordering and performing treatments and interventions, ordering and review of laboratory studies, ordering and review of radiographic studies, pulse oximetry and re-evaluation of patient's condition.         Final Clinical Impression(s) / ED Diagnoses Final diagnoses:  Congestive heart failure, unspecified HF chronicity, unspecified heart failure type ([place])  Failure to thrive in adult    Rx / DC Orders ED Discharge Orders     None  Davonna Belling, MD 11/14/2022 2148

## 2022-11-22 NOTE — ED Triage Notes (Signed)
Patient BIB [facility] from home where he lives with daughter for evaluation of decreased level of consciousness for the last few days. Patient arouses to voice, oriented to self. Has multiple skin tears of varying degrees of healing, edematous and weeping bilateral lower extremities.

## 2022-11-22 NOTE — H&P (Addendum)
PCP:   [name], MD   Chief Complaint:  Shortness of breath  HPI: This is a 89 year old male with past medical history of aortic stenosis, atrial fibrillation, hypertension, hyperlipidemia, h/o AVR [bioprosthetic valve], diabetes mellitus, prostate cancer.  He is a nursing home recipient.  He was sent in with symptoms of decreased appetite, increasing weakness, shortness of breath.  Patient does not provide much history as he is quite annoyed at being in the hospital.  Review of Systems:  Positive: Shortness of breath, lethargy, decreased appetite.  Per patient back pain.  Past Medical History: Past Medical History:  Diagnosis Date   Anemia    LOW PLATELETS OTHER DAY  PER PT   Anticoagulated on Coumadin 12/21/2012   Aortic stenosis    Arthritis    "left wrist; back sometimes" (01/21/2013)   Atrial fibrillation ([place])    [name], DR COOPER   Bradycardia    Exertional shortness of breath    Heart murmur    "I've had it for years; runs in the family on daddy's side" (01/21/2013)   HTN (hypertension)    Hyperlipemia 12/21/2012   Prostate cancer ([place])    "had 40 tx of radiation in 2009" (01/21/2013)   S/P aortic valve replacement with bioprosthetic valve 01/19/2013   Transcatheter Aortic Valve Replacement using 49m Sapien bioprosthetic tissue valve via transapical approach   [place] II diabetes mellitus [place]    Past Surgical History:  Procedure Laterality Date   BALLOON DILATION N/A 08/02/2020   Procedure: BALLOON DILATION;  Surgeon: [name] MD;  Location: [facility];  Service: Gastroenterology;  Laterality: N/A;   BIOPSY  10/03/2021   Procedure: BIOPSY;  Surgeon: [name] MD;  Location: [facility];  Service: Endoscopy;;   BIOPSY  12/14/2021   Procedure: BIOPSY;  Surgeon: [name] MD;  [facility]  Service: Endoscopy;;   CARDIAC CATHETERIZATION  12/16/2012   CARDIAC VALVE REPLACEMENT  01/19/2013   AVR   CATARACT EXTRACTION W/ INTRAOCULAR LENS   IMPLANT, BILATERAL     ESOPHAGOGASTRODUODENOSCOPY N/A 10/03/2021   Procedure: ESOPHAGOGASTRODUODENOSCOPY (EGD);  Surgeon: [name] MD;  [facility];  Service: Endoscopy;  Laterality: N/A;   ESOPHAGOGASTRODUODENOSCOPY N/A 12/14/2021   Procedure: ESOPHAGOGASTRODUODENOSCOPY (EGD);  Surgeon: [name] MD;  [facility]  Service: Endoscopy;  Laterality: N/A;   ESOPHAGOGASTRODUODENOSCOPY (EGD) WITH PROPOFOL N/A 08/02/2020   Procedure: ESOPHAGOGASTRODUODENOSCOPY (EGD) WITH PROPOFOL;  Surgeon: [name] MD;  [facility]: [facility];  Service: Gastroenterology;  Laterality: N/A;   EYE SURGERY     INTRAOPERATIVE TRANSESOPHAGEAL ECHOCARDIOGRAM N/A 01/19/2013   Procedure: INTRAOPERATIVE TRANSESOPHAGEAL ECHOCARDIOGRAM;  Surgeon: CRexene Alberts MD;  [facility]  Service: Open Heart Surgery;  Laterality: N/A;   ORIF FOREARM FRACTURE Left 1954   "compound fx" (01/21/2013)   TRANSTHORACIC ECHOCARDIOGRAM  09/04/10, 09/07/08    Medications: Prior to Admission medications   Medication Sig Start Date End Date Taking? Authorizing Provider  acetaminophen (TYLENOL) 650 MG CR tablet Take 650 mg by mouth daily before breakfast.    [provider]  Ascorbic Acid (VITAMIN C) 1000 MG tablet Take 1,000 mg by mouth daily.    [provider]  aspirin EC 81 MG tablet Take 1 tablet (81 mg total) by mouth daily. Swallow whole. 04/01/22   Medina-Vargas, Monina C, NP  cetirizine (ZYRTEC) 10 MG tablet Take 10 mg by mouth daily.    [provider]  cholecalciferol (VITAMIN D3) 25 MCG (1000 UNIT) tablet Take 1,000 Units by mouth  2 (two) times daily with a meal.    [provider]  dapagliflozin propanediol (FARXIGA) 10 MG TABS tablet Take 1 tablet (10 mg total) by mouth daily. 04/01/22   Medina-Vargas, Monina C, NP  ferrous sulfate 325 (65 FE) MG tablet Take 1 tablet (325 mg total) by mouth 2 (two) times daily with a meal. 04/01/22   Medina-Vargas, Monina C,  NP  furosemide (LASIX) 80 MG tablet Take 1.5 tablets (120 mg total) by mouth daily. 11/14/22   Chandrasekhar, Terisa Starr, MD  gabapentin (NEURONTIN) 100 MG capsule Take 1 capsule (100 mg total) by mouth at bedtime. 09/23/22   Patel, Donika K, DO  glipiZIDE (GLUCOTROL XL) 2.5 MG 24 hr tablet Take 2.5 mg by mouth every morning. 07/19/22   [provider]  KLOR-CON M20 20 MEQ tablet Take 40 mEq by mouth 3 (three) times a week. Jory Sims, Ludwig Clarks 08/21/22   [provider]  metFORMIN (GLUCOPHAGE) 1000 MG tablet Take 1 tablet (1,000 mg total) by mouth 2 (two) times daily with breakfast and lunch. 04/01/22   Medina-Vargas, Monina C, NP  metolazone (ZAROXOLYN) 2.5 MG tablet Take 1 tablet (2.5 mg total) by mouth every other day. 11/18/22   [name], MD  OXYGEN Inhale 2 L into the lungs as needed (shortness of breath).    [provider]  pantoprazole (PROTONIX) 40 MG tablet Take 1 tablet (40 mg total) by mouth 2 (two) times daily. Take  1 tab of 40 mg tab  by mouth twice a day till 02/16/22 then 1 tab of 40 mg by mouth daily starting 02/17/22 04/01/22   Medina-Vargas, Monina C, NP  rosuvastatin (CRESTOR) 5 MG tablet TAKE 1 TABLET BY MOUTH ONCE DAILY AT  6PM 09/03/22   Elgie Collard, PA-C  Vitamin A 3 MG (10000 UT) TABS Take 1,000 mg by mouth daily at 6 (six) AM.    [provider]  XTANDI 40 MG capsule Take 160 mg by mouth daily. 09/13/22   [provider]    Allergies:   Allergies  Allergen Reactions   Amoxicillin Other (See Comments)    Burning of mouth, tongue and lips.  itchy, burning throat    Social History:  reports that he quit smoking about 46 years ago. His smoking use included cigarettes. He has a 44.00 pack-year smoking history. He has never used smokeless tobacco. He reports that he does not use drugs. No history on file for alcohol use.  Family History: Family History  Problem Relation Age of Onset   Diabetes Mother    Melanoma Mother  58   Bone cancer Mother 56   Pancreatic cancer Father 59   Heart disease Father    Stroke Sister    Cancer Brother 63       ? brain cancer    Physical Exam: Vitals:   11/27/2022 2115 11/28/2022 2145 11/12/2022 2200 11/26/2022 2215  BP: 103/68 (!) 101/52 111/65 97/62  Pulse: (!) 105 (!) 106 (!) 113 (!) 111  Resp: (!) 25   17  Temp:      TempSrc:      SpO2: 95% 90% 94% 93%    General:  Alert and oriented times three, in no distress but aggravated Eyes: PERRLA, pink conjunctiva, no scleral icterus ENT: Moist oral mucosa, neck supple, no thyromegaly Lungs: clear to ascultation, no wheeze, no crackles, no use of accessory muscles Cardiovascular: regular rate and rhythm, no regurgitation, no gallops, no murmurs. No carotid bruits, no  JVD Abdomen: soft, positive BS, non-tender, non-distended, no organomegaly, not an acute abdomen GU: not examined Neuro: CN II - XII grossly intact, sensation intact Musculoskeletal: strength 5/5 all extremities, +2 bilateral lower extremity pitting, tenderness to palpation Skin: no rash, no subcutaneous crepitation, no decubitus Psych: Aggravated patient   Labs on Admission:  Recent Labs    11/06/2022 1459  NA 126*  K 3.5  CL 92*  CO2 15*  GLUCOSE 100*  BUN 72*  CREATININE 2.02*  CALCIUM 8.4*   Recent Labs    11/15/2022 1459  AST 47*  ALT 14  ALKPHOS 123  BILITOT 3.0*  PROT 6.2*  ALBUMIN 2.8*      Radiological Exams on Admission: DG Chest 2 View  Result Date: 12/02/2022 CLINICAL DATA:  Possible sepsis. EXAM: CHEST - 2 VIEW COMPARISON:  Chest x-ray dated October 18, 2022. FINDINGS: Stable cardiomegaly status post TAVR. No focal consolidation, pleural effusion, or pneumothorax. No acute osseous abnormality. IMPRESSION: No active cardiopulmonary disease. Electronically Signed   By: Titus Dubin M.D.   On: 11/14/2022 15:37    Assessment/Plan Present on Admission:  Acute on chronic heart failure with preserved ejection fraction (HFpEF)  ([place])  RV failure/severe mitral regurgitation/severe tricuspid regurgitation  Chronic respiratory failure -Admit to med telemetry -CHF/fluid overload order set initiated -IV Lasix,, strict I's and O's, daily weights -Oxygen to keep sats greater than 88% -Today's BNP 1400, last BMP done 12/15 was 1900? -Per cardiology, medication optimization is his only option -Cardiology consult placed   Acute on chronic kidney disease, stage IIIb -Secondary to diuresis.  Currently on Lasix to '20mg'$  IV BiD -Spironolactone on hold -maybe d/t cardiorenal syndrome.  Monitor creatinine with diuresis.    Hyponatremia/hypochloremia/hypocalcemia -Likely secondary fluid overload.  Fluid restricted -Receiving IV Lasix -BMP in a.m.   Lactic acidosis -Doubt infection, follow   Hyperlipemia -Stable, resume Crestor   Essential hypertension -Borderline blood pressures.   Monitor with diuresis.   Persistent atrial fibrillation (HCC) -Not on anticoagulation due to chronic anemia -Per chart review, not a candidate for Watchman device   Diabetes mellitus 2/peripheral neuropathy -Sliding scale insulin initiated -Resume gabapentin   Physical deconditioning -PT consult   Pressure injury of sacral region, stage 2 ([place]) -Wound care consult   Unspecified protein calorie malnutrition -Nutrition consult, Ensure with meals   Iron deficiency anemia due to chronic blood loss -Hemoglobin stable -Follows with oncology, on IV Venfer per oncology -p.o. iron resume   Failure to thrive -Long discussion regoals of care, done last admission no change in plans at this time.  May want palliative consult and rediscussion   Lorella Gomez 11/23/2022, 11:13 PM

## 2022-11-23 ENCOUNTER — Inpatient Hospital Stay (HOSPITAL_COMMUNITY): Payer: Medicare Other

## 2022-11-23 DIAGNOSIS — Z7189 Other specified counseling: Secondary | ICD-10-CM | POA: Diagnosis not present

## 2022-11-23 DIAGNOSIS — I071 Rheumatic tricuspid insufficiency: Secondary | ICD-10-CM

## 2022-11-23 DIAGNOSIS — E43 Unspecified severe protein-calorie malnutrition: Secondary | ICD-10-CM

## 2022-11-23 DIAGNOSIS — L89152 Pressure ulcer of sacral region, stage 2: Secondary | ICD-10-CM

## 2022-11-23 DIAGNOSIS — R296 Repeated falls: Secondary | ICD-10-CM

## 2022-11-23 DIAGNOSIS — I5032 Chronic diastolic (congestive) heart failure: Secondary | ICD-10-CM

## 2022-11-23 DIAGNOSIS — I482 Chronic atrial fibrillation, unspecified: Secondary | ICD-10-CM | POA: Diagnosis not present

## 2022-11-23 DIAGNOSIS — R627 Adult failure to thrive: Secondary | ICD-10-CM | POA: Diagnosis not present

## 2022-11-23 DIAGNOSIS — I5043 Acute on chronic combined systolic (congestive) and diastolic (congestive) heart failure: Secondary | ICD-10-CM | POA: Diagnosis not present

## 2022-11-23 DIAGNOSIS — I4819 Other persistent atrial fibrillation: Secondary | ICD-10-CM

## 2022-11-23 DIAGNOSIS — Z8546 Personal history of malignant neoplasm of prostate: Secondary | ICD-10-CM

## 2022-11-23 DIAGNOSIS — I340 Nonrheumatic mitral (valve) insufficiency: Secondary | ICD-10-CM

## 2022-11-23 DIAGNOSIS — Z953 Presence of xenogenic heart valve: Secondary | ICD-10-CM

## 2022-11-23 DIAGNOSIS — I5033 Acute on chronic diastolic (congestive) heart failure: Secondary | ICD-10-CM | POA: Diagnosis not present

## 2022-11-23 LAB — ECHOCARDIOGRAM COMPLETE
AR max vel: 3.09 cm2
AV Area VTI: 3.05 cm2
AV Area mean vel: 2.99 cm2
AV Mean grad: 4 mmHg
AV Peak grad: 8 mmHg
Ao pk vel: 1.41 m/s
MV M vel: 4.19 m/s
MV Peak grad: 70.2 mmHg
Radius: 0.7 cm
S' Lateral: 4 cm

## 2022-11-23 LAB — GLUCOSE, CAPILLARY
Glucose-Capillary: 130 mg/dL — ABNORMAL HIGH (ref 70–99)
Glucose-Capillary: 51 mg/dL — ABNORMAL LOW (ref 70–99)
Glucose-Capillary: 86 mg/dL (ref 70–99)

## 2022-11-23 LAB — HEMOGLOBIN A1C
Hgb A1c MFr Bld: 7.3 % — ABNORMAL HIGH (ref 4.8–5.6)
Mean Plasma Glucose: 162.81 mg/dL

## 2022-11-23 LAB — BASIC METABOLIC PANEL
Anion gap: 18 — ABNORMAL HIGH (ref 5–15)
BUN: 75 mg/dL — ABNORMAL HIGH (ref 8–23)
CO2: 17 mmol/L — ABNORMAL LOW (ref 22–32)
Calcium: 8.5 mg/dL — ABNORMAL LOW (ref 8.9–10.3)
Chloride: 92 mmol/L — ABNORMAL LOW (ref 98–111)
Creatinine, Ser: 2.1 mg/dL — ABNORMAL HIGH (ref 0.61–1.24)
GFR, Estimated: 30 mL/min — ABNORMAL LOW (ref >60)
Glucose, Bld: 58 mg/dL — ABNORMAL LOW (ref 70–99)
Potassium: 3.5 mmol/L (ref 3.5–5.1)
Sodium: 127 mmol/L — ABNORMAL LOW (ref 135–145)

## 2022-11-23 LAB — CBG MONITORING, ED
Glucose-Capillary: 55 mg/dL — ABNORMAL LOW (ref 70–99)
Glucose-Capillary: 101 mg/dL — ABNORMAL HIGH (ref 70–99)
Glucose-Capillary: 69 mg/dL — ABNORMAL LOW (ref 70–99)
Glucose-Capillary: 92 mg/dL (ref 70–99)
Glucose-Capillary: 158 mg/dL — ABNORMAL HIGH (ref 70–99)

## 2022-11-23 LAB — LACTIC ACID, PLASMA: Lactic Acid, Venous: 3.2 mmol/L (ref 0.5–1.9)

## 2022-11-23 LAB — MAGNESIUM: Magnesium: 2 mg/dL (ref 1.7–2.4)

## 2022-11-23 MED ORDER — FUROSEMIDE 10 MG/ML IJ SOLN
40.0000 mg | Freq: Once | INTRAMUSCULAR | Status: AC
  Administered 2022-11-23: 40 mg via INTRAVENOUS
  Filled 2022-11-23: qty 4

## 2022-11-23 MED ORDER — DAPAGLIFLOZIN PROPANEDIOL 10 MG PO TABS
10.0000 mg | ORAL_TABLET | Freq: Every day | ORAL | Status: DC
  Administered 2022-11-23: 10 mg via ORAL
  Filled 2022-11-23 (×2): qty 1

## 2022-11-23 MED ORDER — PANTOPRAZOLE SODIUM 40 MG PO TBEC
40.0000 mg | DELAYED_RELEASE_TABLET | Freq: Two times a day (BID) | ORAL | Status: DC
  Administered 2022-11-23 (×2): 40 mg via ORAL
  Filled 2022-11-23 (×3): qty 1

## 2022-11-23 MED ORDER — DEXTROSE 50 % IV SOLN
25.0000 g | INTRAVENOUS | Status: AC
  Administered 2022-11-23: 25 g via INTRAVENOUS

## 2022-11-23 MED ORDER — ENSURE ENLIVE PO LIQD
237.0000 mL | Freq: Two times a day (BID) | ORAL | Status: DC
  Administered 2022-11-23 – 2022-11-24 (×3): 237 mL via ORAL
  Filled 2022-11-23: qty 237

## 2022-11-23 MED ORDER — MORPHINE SULFATE (PF) 2 MG/ML IV SOLN
1.0000 mg | Freq: Once | INTRAVENOUS | Status: AC
  Administered 2022-11-23: 1 mg via INTRAVENOUS

## 2022-11-23 MED ORDER — ASPIRIN 81 MG PO TBEC
81.0000 mg | DELAYED_RELEASE_TABLET | Freq: Every day | ORAL | Status: DC
  Administered 2022-11-23: 81 mg via ORAL
  Filled 2022-11-23 (×2): qty 1

## 2022-11-23 MED ORDER — PANTOPRAZOLE SODIUM 40 MG PO TBEC: 40.0000 mg | DELAYED_RELEASE_TABLET | Freq: Two times a day (BID) | ORAL | Status: DC

## 2022-11-23 MED ORDER — INSULIN ASPART 100 UNIT/ML IJ SOLN
0.0000 [IU] | Freq: Three times a day (TID) | INTRAMUSCULAR | Status: DC
  Administered 2022-11-23: 3 [IU] via SUBCUTANEOUS

## 2022-11-23 MED ORDER — FUROSEMIDE 10 MG/ML IJ SOLN: 20.0000 mg | Freq: Once | INTRAMUSCULAR | Status: DC

## 2022-11-23 MED ORDER — ENZALUTAMIDE 40 MG PO CAPS: 160.0000 mg | ORAL_CAPSULE | Freq: Every day | ORAL | Status: DC

## 2022-11-23 MED ORDER — DEXTROSE 50 % IV SOLN
INTRAVENOUS | Status: AC
  Administered 2022-11-24: 50 mL via INTRAVENOUS
  Filled 2022-11-23: qty 50

## 2022-11-23 MED ORDER — FUROSEMIDE 10 MG/ML IJ SOLN
80.0000 mg | Freq: Two times a day (BID) | INTRAMUSCULAR | Status: DC
  Administered 2022-11-23 (×2): 80 mg via INTRAVENOUS
  Filled 2022-11-23 (×3): qty 8

## 2022-11-23 MED ORDER — PERFLUTREN LIPID MICROSPHERE
1.0000 mL | INTRAVENOUS | Status: AC | PRN
  Administered 2022-11-23: 2 mL via INTRAVENOUS

## 2022-11-23 MED ORDER — METOLAZONE 2.5 MG PO TABS: 2.5000 mg | ORAL_TABLET | ORAL | Status: DC

## 2022-11-23 MED ORDER — GABAPENTIN 100 MG PO CAPS
100.0000 mg | ORAL_CAPSULE | Freq: Every day | ORAL | Status: DC
  Administered 2022-11-23 (×2): 100 mg via ORAL
  Filled 2022-11-23 (×2): qty 1

## 2022-11-23 MED ORDER — INSULIN ASPART 100 UNIT/ML IJ SOLN: 0.0000 [IU] | Freq: Every day | INTRAMUSCULAR | Status: DC

## 2022-11-23 MED ORDER — ROSUVASTATIN CALCIUM 5 MG PO TABS
5.0000 mg | ORAL_TABLET | Freq: Every day | ORAL | Status: DC
  Administered 2022-11-23: 5 mg via ORAL
  Filled 2022-11-23 (×2): qty 1

## 2022-11-23 MED ORDER — POTASSIUM CHLORIDE CRYS ER 20 MEQ PO TBCR
40.0000 meq | EXTENDED_RELEASE_TABLET | Freq: Once | ORAL | Status: AC
  Administered 2022-11-23: 40 meq via ORAL

## 2022-11-23 NOTE — Consult Note (Signed)
Consultation Note Date: 11/23/2022   Patient Name: [name]  DOB: [birth year]  MRN: [id number]  Age / Sex: 89 y.o., male  PCP: [name], MD Referring Physician: Darliss Cheney, MD  Reason for Consultation: Establishing goals of care  HPI/Patient Profile: 89 y.o. male  with past medical history of aortic stenosis, atrial fibrillation, hypertension, hyperlipidemia, h/o AVR [bioprosthetic valve], diabetes mellitus, prostate cancer admitted on 11/21/2022 with poor appetite, weakness, dyspnea.   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  PMT has been consulted to assist with goals of care conversation.  Clinical Assessment and Goals of Care:  I have reviewed medical records including EPIC notes, labs and imaging, assessed the patient and then met at the bedside with patient's daughter Dondra Spry to discuss diagnosis prognosis, GOC, EOL wishes, disposition and options.  I introduced Palliative Medicine as specialized medical care for people living with serious illness. It focuses on providing relief from the symptoms and stress of a serious illness. The goal is to improve quality of life for both the patient and the family.  We discussed a brief life review of the patient and then focused on their current illness.   I attempted to elicit values and goals of care important to the patient.    Medical History Review and Understanding:  Sherrie understands that patient has had another recurrence of acute on chronic heart failure.  I reviewed patient's AKI she states she was not aware of this.  Cardiorenal syndrome was explained.  Social History: Patient's wife is currently residing at [facility]: [place] Rhonda W.  He has 1 daughter and a son who is deceased.  He greatly enjoys his freedom and being at home as much as possible. 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  He was previously head of maintenance at the [employer] and worked several overtime shifts in his youth.  Functional and Nutritional State: Patient's daughter states that when he is not fluid overloaded, he is cognitively intact and able to ambulate well with a walker.  He is able to write checks and function well.  Albumin of 2.8 noted on 1/19.  Palliative Symptoms: Dyspnea, lethargy, weakness, pain  Advance Directives: A detailed discussion regarding advanced directives was had. [place] document in [place] from 03/2022 was reviewed   Code Status: Concepts specific to code status, artifical feeding and hydration, and rehospitalization were considered and discussed.  Patient's daughter does not feel like he would want an ICU admission.  DNR confirmed.  Discussion: Patient's daughter shares the goal is for improvement at this time.  They have had anticipatory care planning discussions in the past and she has promised to "fight for him when he cannot fight for himself."  She does not feel that she can continue caring for him, as she works a full-time job and there is no one else to support him.  Her goal is for SNF placement and [facility] would be preferred given that his wife is there.  She is concerned about his mental status and believes this is attributed to morphine.  She does not think he has ever gotten this before.  While she is glad to see him pain-free, she worries about possible hallucinations.  Apparently he thought his pudding was in his hands while he was eating lunch.  She reports a good understanding the severity of patient's illness after phone discussion with cardiology today.  She appreciates further updates on his AKI.  His lab work and  updated echo results were also reviewed with her.  She understands that anything can happen at any time and is open to further goals of care discussions based on his progress or lack thereof.   Discussed the importance of continued conversation  regarding overall plan of care and treatment options, ensuring decisions are within the context of the patient's values and GOCs.   Questions and concerns were addressed. The family was encouraged to call with questions or concerns.  PMT will continue to support holistically.   SUMMARY OF RECOMMENDATIONS   -Continue DNR -Continue current care, patient's daughter does not feel that he would want escalation of care including ICU admission -She is hopeful for improvement at this time and goal is for SNF placement -Ongoing goals of care discussion pending clinical course -PMT will continue to follow and support  Prognosis:  Poor long-term prognosis given recurrent hospitalizations and several chronic comorbidities  Discharge Planning: To Be Determined      Primary Diagnoses: Present on Admission:  Acute on chronic heart failure with preserved ejection fraction (HFpEF) (HCC)  Hyperlipemia  PAD (peripheral artery disease) (HCC)  Essential hypertension  Unspecified protein-calorie malnutrition (HCC)  Persistent atrial fibrillation (HCC)  Diabetic neuropathy (HCC)  Coagulopathy (HCC)  Physical deconditioning  Protein-calorie malnutrition, severe  Pulmonary hypertension (HCC)  Pressure injury of sacral region, stage 2 (HCC)  Severe mitral regurgitation  Severe tricuspid regurgitation   Physical Exam Vitals and nursing note reviewed.  Constitutional:      General: He is not in acute distress.    Appearance: He is ill-appearing.  Cardiovascular:     Rate and Rhythm: Bradycardia present.  Pulmonary:     Effort: Pulmonary effort is normal. No respiratory distress.  Neurological:     Mental Status: He is lethargic and disoriented.  Psychiatric:        Cognition and Memory: Cognition is impaired.    Vital Signs: BP 91/63   Pulse (!) 58   Temp 97.7 F (36.5 C) (Axillary)   Resp 19   SpO2 99%  Pain Scale: 0-10   Pain Score: 0-No pain   SpO2: SpO2: 99 % O2 Device:SpO2:  99 % O2 Flow Rate: .    Palliative Assessment/Data: 20%    MDM:   Norberta Keens, PA-C  Palliative Medicine Team Team phone # [phone]  Thank you for allowing the Palliative Medicine Team to assist in the care of this patient. Please utilize secure chat with additional questions, if there is no response within 30 minutes please call the above phone number.  Palliative Medicine Team providers are available by phone from 7am to 7pm daily and can be reached through the team cell phone.  Should this patient require assistance outside of these hours, please call the patient's attending physician.

## 2022-11-23 NOTE — Progress Notes (Signed)
Hypoglycemic Event  CBG: 51  Treatment: D50 50 mL (25 gm)  Symptoms: Nervous/irritable  Follow-up CBG: Time:1725  CBG Result:130     Lilli Few

## 2022-11-23 NOTE — Progress Notes (Signed)
PROGRESS NOTE    [name]  MHW:[id number] DOB: 03-Apr-1933 DOA: 11/29/2022 PCP: [name], MD   Brief Narrative:  HPI: This is a 89 year old male with past medical history of aortic stenosis, atrial fibrillation, hypertension, hyperlipidemia, h/o AVR [bioprosthetic valve], diabetes mellitus, prostate cancer.  He is a nursing home recipient.  He was sent in with symptoms of decreased appetite, increasing weakness, shortness of breath.  Patient does not provide much history as he is quite annoyed at being in the hospital.  Assessment & Plan:   Principal Problem:   Acute on chronic heart failure with preserved ejection fraction (HFpEF) (HCC) Active Problems:   Unspecified protein-calorie malnutrition ([place])   Hyperlipemia   H/O prostate cancer   Anticoagulated on Coumadin   S/P aortic valve replacement with bioprosthetic valve   PAD (peripheral artery disease) (HCC)   Diabetic neuropathy (HCC)   Recurrent falls   Essential hypertension   Coagulopathy (HCC)   Physical deconditioning   Protein-calorie malnutrition, severe   Persistent atrial fibrillation (HCC)   Pressure injury of sacral region, stage 2 (HCC)   Pulmonary hypertension (HCC)   Atrial fibrillation, chronic (HCC)   Acute on chronic congestive heart failure (HCC)  Acute on chronic heart failure with preserved ejection fraction (HFpEF) (HCC)  RV failure/severe mitral regurgitation/severe tricuspid regurgitation -Today's BNP 1400, last BNP done 12/15 was 1900.  -Per cardiology, medication optimization is his only option -Cardiology is following the patient, they have increased his IV Lasix to 80 mg twice daily and has continued [place].    Acute on chronic kidney disease, stage IIIb: Baseline creatinine appears to be around 1.6, presented with 2.10.  Likely cardiorenal.  Lasix being managed by cardiology and they have increased it.    Hyponatremia/hypochloremia/hypocalcemia -Likely secondary fluid overload.   Fluid restricted.  Sodium and calcium slightly improved and chloride is stabilized.    Lactic acidosis -Doubt infection, repeat labs today.    Hyperlipemia Continue Crestor    Essential hypertension -Borderline blood pressures.   Monitor with diuresis.  Per cardiology, patient may need INO drops in order to continue diuresis.    Persistent atrial fibrillation (HCC) -Not on anticoagulation due to chronic anemia -Per chart review, not a candidate for Watchman device.  Rates fluctuating, he is not on any rate control medications.    Diabetes mellitus 2/peripheral neuropathy -Sliding scale insulin initiated Continue gabapentin    Physical deconditioning -PT/OT consult    Pressure injury of sacral region, stage 2 (HCC) -Wound care consult    Unspecified protein calorie malnutrition -Nutrition consult, Ensure with meals    Iron deficiency anemia due to chronic blood loss -Hemoglobin stable -Follows with oncology, on IV Venfer per oncology -p.o. iron resumed    Failure to thrive -Long discussion regoals of care, done last admission no change in plans at this time.  Cardiology also recommends consulting palliative care due to recurrent hospitalizations.  Have consulted Perative care.  DVT prophylaxis: heparin injection 5,000 Units Start: 11/23/22 0000   Code Status: DNR  Family Communication:  None present at bedside.  Plan of care discussed with patient in length and he/she verbalized understanding and agreed with it.  Status is: Inpatient Remains inpatient appropriate because: Patient needs IV diuresis.   Estimated body mass index is 23.52 kg/m as calculated from the following:   Height as of 11/06/22: 5' 8.5" (1.74 m).   Weight as of 11/06/22: 71.2 kg.  Pressure Injury 10/02/21 Coccyx Medial Stage 3 -  Full thickness tissue  loss. Subcutaneous fat may be visible but bone, tendon or muscle are NOT exposed. (Active)  10/02/21 1425  Location: Coccyx  Location Orientation:  Medial  Staging: Stage 3 -  Full thickness tissue loss. Subcutaneous fat may be visible but bone, tendon or muscle are NOT exposed.  Wound Description (Comments):   Present on Admission: Yes     Pressure Injury 03/01/22 Coccyx Medial Stage 2 -  Partial thickness loss of dermis presenting as a shallow open injury with a red, pink wound bed without slough. (Active)  03/01/22 1600  Location: Coccyx  Location Orientation: Medial  Staging: Stage 2 -  Partial thickness loss of dermis presenting as a shallow open injury with a red, pink wound bed without slough.  Wound Description (Comments):   Present on Admission: Yes     Pressure Injury 10/19/22 Buttocks Left Stage 2 -  Partial thickness loss of dermis presenting as a shallow open injury with a red, pink wound bed without slough. pink, shallow (Active)  10/19/22 1800  Location: Buttocks  Location Orientation: Left  Staging: Stage 2 -  Partial thickness loss of dermis presenting as a shallow open injury with a red, pink wound bed without slough.  Wound Description (Comments): pink, shallow  Present on Admission: Yes   Nutritional Assessment: There is no height or weight on file to calculate BMI.. Seen by dietician.  I agree with the assessment and plan as outlined below: Nutrition Status:        . Skin Assessment: I have examined the patient's skin and I agree with the wound assessment as performed by the wound care RN as outlined below: Pressure Injury 10/02/21 Coccyx Medial Stage 3 -  Full thickness tissue loss. Subcutaneous fat may be visible but bone, tendon or muscle are NOT exposed. (Active)  10/02/21 1425  Location: Coccyx  Location Orientation: Medial  Staging: Stage 3 -  Full thickness tissue loss. Subcutaneous fat may be visible but bone, tendon or muscle are NOT exposed.  Wound Description (Comments):   Present on Admission: Yes     Pressure Injury 03/01/22 Coccyx Medial Stage 2 -  Partial thickness loss of dermis  presenting as a shallow open injury with a red, pink wound bed without slough. (Active)  03/01/22 1600  Location: Coccyx  Location Orientation: Medial  Staging: Stage 2 -  Partial thickness loss of dermis presenting as a shallow open injury with a red, pink wound bed without slough.  Wound Description (Comments):   Present on Admission: Yes     Pressure Injury 10/19/22 Buttocks Left Stage 2 -  Partial thickness loss of dermis presenting as a shallow open injury with a red, pink wound bed without slough. pink, shallow (Active)  10/19/22 1800  Location: Buttocks  Location Orientation: Left  Staging: Stage 2 -  Partial thickness loss of dermis presenting as a shallow open injury with a red, pink wound bed without slough.  Wound Description (Comments): pink, shallow  Present on Admission: Yes    Consultants:  Neurology and palliative care  Procedures:  None  Antimicrobials:  Anti-infectives (From admission, onward)    None         Subjective: Patient seen and examined.  He complains of exertional shortness of breath, he appears to be very weak.  Objective: Vitals:   11/23/22 0515 11/23/22 0600 11/23/22 0630 11/23/22 1015  BP: 1'08/72 92/68 96/65 '$   Pulse: (!) 110 (!) 104 (!) 108 (!) 25  Resp: '18 16 13 16  '$ Temp:  TempSrc:      SpO2: 97% 97% 97% (!) 67%    Intake/Output Summary (Last 24 hours) at 11/23/2022 1112 Last data filed at 11/23/2022 1105 Gross per 24 hour  Intake 113 ml  Output 500 ml  Net -387 ml   There were no vitals filed for this visit.  Examination:  General exam: Appears very weak Respiratory system: Clear to auscultation. Respiratory effort normal. Cardiovascular system: S1 & S2 heard, RRR. No JVD, murmurs, rubs, gallops or clicks.  +2 pitting edema bilateral lower extremity. Gastrointestinal system: Abdomen is nondistended, soft and nontender. No organomegaly or masses felt. Normal bowel sounds heard. Central nervous system: Alert and oriented.  No focal neurological deficits. Extremities: Chronic venous stasis changes bilateral lower extremities.  Data Reviewed: I have personally reviewed following labs and imaging studies  CBC: Recent Labs  Lab 11/28/2022 1459  WBC 8.2  NEUTROABS 6.7  HGB 10.5*  HCT 28.9*  MCV 88.7  PLT 010   Basic Metabolic Panel: Recent Labs  Lab 11/26/2022 1459 11/23/22 0211  NA 126* 127*  K 3.5 3.5  CL 92* 92*  CO2 15* 17*  GLUCOSE 100* 58*  BUN 72* 75*  CREATININE 2.02* 2.10*  CALCIUM 8.4* 8.5*  MG  --  2.0   GFR: CrCl cannot be calculated (Unknown ideal weight.). Liver Function Tests: Recent Labs  Lab 11/18/2022 1459  AST 47*  ALT 14  ALKPHOS 123  BILITOT 3.0*  PROT 6.2*  ALBUMIN 2.8*   No results for input(s): "LIPASE", "AMYLASE" in the last 168 hours. No results for input(s): "AMMONIA" in the last 168 hours. Coagulation Profile: Recent Labs  Lab 11/05/2022 1459  INR 2.5*   Cardiac Enzymes: No results for input(s): "CKTOTAL", "CKMB", "CKMBINDEX", "TROPONINI" in the last 168 hours. BNP (last 3 results) Recent Labs    12/05/21 0915  PROBNP 6,343*   HbA1C: Recent Labs    11/23/22 0211  HGBA1C 7.3*   CBG: Recent Labs  Lab 11/23/22 0209 11/23/22 0253 11/23/22 0312 11/23/22 0741  GLUCAP 55* 69* 101* 92   Lipid Profile: No results for input(s): "CHOL", "HDL", "LDLCALC", "TRIG", "CHOLHDL", "LDLDIRECT" in the last 72 hours. Thyroid Function Tests: No results for input(s): "TSH", "T4TOTAL", "FREET4", "T3FREE", "THYROIDAB" in the last 72 hours. Anemia Panel: No results for input(s): "VITAMINB12", "FOLATE", "FERRITIN", "TIBC", "IRON", "RETICCTPCT" in the last 72 hours. Sepsis Labs: Recent Labs  Lab 11/24/2022 1459 11/10/2022 1948  LATICACIDVEN 4.3* 2.6*    Recent Results (from the past 240 hour(s))  Culture, blood (Routine x 2)     Status: None (Preliminary result)   Collection Time: 11/06/2022  2:59 PM   Specimen: BLOOD RIGHT ARM  Result Value Ref Range Status    Specimen Description BLOOD RIGHT ARM  Final   Special Requests   Final    BOTTLES DRAWN AEROBIC AND ANAEROBIC Blood Culture results may not be optimal due to an inadequate volume of blood received in culture bottles   Culture   Final    NO GROWTH < 24 HOURS Performed at [facility], [place] [address] [place], [place] [address]    Report Status PENDING  Incomplete  Culture, blood (Routine x 2)     Status: None (Preliminary result)   Collection Time: 11/04/2022  6:21 PM   Specimen: BLOOD  Result Value Ref Range Status   Specimen Description BLOOD RIGHT ANTECUBITAL  Final   Special Requests   Final    BOTTLES DRAWN AEROBIC AND ANAEROBIC Blood Culture  adequate volume   Culture   Final    NO GROWTH < 24 HOURS Performed at [facility], [place] [address] [place], [place] [address]    Report Status PENDING  Incomplete     Radiology Studies: DG Chest 2 View  Result Date: 11/18/2022 CLINICAL DATA:  Possible sepsis. EXAM: CHEST - 2 VIEW COMPARISON:  Chest x-ray dated October 18, 2022. FINDINGS: Stable cardiomegaly status post TAVR. No focal consolidation, pleural effusion, or pneumothorax. No acute osseous abnormality. IMPRESSION: No active cardiopulmonary disease. Electronically Signed   By: Titus Dubin M.D.   On: 11/08/2022 15:37    Scheduled Meds:  aspirin EC  81 mg Oral Daily   dapagliflozin propanediol  10 mg Oral Daily   enzalutamide  160 mg Oral Daily   feeding supplement  237 mL Oral BID BM   furosemide  80 mg Intravenous Q12H   gabapentin  100 mg Oral QHS   heparin  5,000 Units Subcutaneous Q8H   insulin aspart  0-15 Units Subcutaneous TID WC   insulin aspart  0-5 Units Subcutaneous QHS   pantoprazole  40 mg Oral BID   rosuvastatin  5 mg Oral Daily   sodium chloride flush  3 mL Intravenous Q12H   Continuous Infusions:  sodium chloride       LOS: 1 day   Darliss Cheney, MD [facility]  11/23/2022, 11:12 AM   *Please note that this is a verbal  dictation therefore any spelling or grammatical errors are due to the "[place] [employer]" system interpretation.  Please page via [place] and do not message via secure chat for urgent patient care matters. Secure chat can be used for non urgent patient care matters.  How to contact the [facility] Attending or Consulting provider [place] or covering provider during after hours [place], for this patient?  Check the care team in [facility] and look for a) attending/consulting [facility] provider listed and b) the [facility] team listed. Page or secure chat [phone]. Log into [email or web address] and use [place]'s universal password to access. If you do not have the password, please contact the hospital operator. Locate the [facility] provider you are looking for under [facility] and page to a number that you can be directly reached. If you still have difficulty reaching the provider, please page the [facility] (Director on Call) for the Hospitalists listed on amion for assistance.

## 2022-11-23 NOTE — Progress Notes (Signed)
  Echocardiogram 2D Echocardiogram has been performed.  [name] 11/23/2022, 11:14 AM

## 2022-11-23 NOTE — ED Notes (Signed)
Echo at bedside

## 2022-11-23 NOTE — Consult Note (Addendum)
Cardiology Consultation   Patient ID: [name] MRN: [id number]; DOB: 1933-05-21  Admit date: 11/21/2022 Date of Consult: 11/23/2022  PCP:  [name], [facility] Cardiologist:  [name], MD        Patient Profile:   [name] is a 89 y.o. male with a hx of severe AS s/p TAVR, severe TR, moderate MR, chronic HFrEF, permanent afib, PVCs, hypertension, hyperlipidemia, chronic anemia, PAD (followed by Dr. [name]) who is being seen 11/23/2022 for the evaluation of heart failure exacerbation at the request of Dr. Claria Dice.  History of Present Illness:   [name] was brought to the ED from home via EMS on 11/28/2022 due to shortness of breath, lethargy, decreased appetite/oral intake. Of note, he was recently admitted 10/18/22-10/21/22 with heart failure exacerbation. During that admission, [facility] was consulted for acute on chronic combined systolic and diastolic CHF. Troponins were elevated in the setting of acute decompensated CHF and cardiorenal syndrome. TTE 10/19/2022 revealed LVEF 45 to 50%, global HK of LV, indeterminate diastolic parameters, interventricular septum flattened in systole and diastole, consistent with right ventricular pressure and volume overload. Mildly reduced RV function, moderately enlarged RV cavity, RVSP 41.2 mmHg, severe biatrial enlargement, moderate MR, severe TR, TAVR valve in position with mild PVL unchanged from prior echo, mild AI. LVEF decreased, and MR improved from prior echo. Ultimately ischemic workup was deferred given CKD with AKI.   Initial labs this admission notable for lactic acidosis up to 4.3, BNP 1471.0, hyponatremia at 127, creatinine up to 2.1. Albumin low at 2.8, along with total protein of 6.2. No leukocytosis.  Patient is lethargic and sleepy on my exam, appears to have recently received morphine for pain. He provides a limited history, says that he has been feeling poorly for the past  2 weeks with increased leg swelling and worsening shortness of breath. He expresses strong frustration with his weakness. I also called patient's daughter today to obtain further HPI. She advises that patient has progressively gained weight/retained fluid over the past few weeks despite increasing doses of lasix and addition of every other day metolazone. (Review of outpatient telephone encounters shows that lasix '120mg'$  BID was recommended on 1/10 and Metolazone 2.'5mg'$  PO QOD was added). Patient apparently asked to be taken to the hospital yesterday because of how poorly he felt.   Past Medical History:  Diagnosis Date   Anemia    LOW PLATELETS OTHER DAY  PER PT   Anticoagulated on Coumadin 12/21/2012   Aortic stenosis    Arthritis    "left wrist; back sometimes" (01/21/2013)   Atrial fibrillation ([place])    [name], DR COOPER   Bradycardia    Exertional shortness of breath    Heart murmur    "I've had it for years; runs in the family on daddy's side" (01/21/2013)   HTN (hypertension)    Hyperlipemia 12/21/2012   Prostate cancer ([place])    "had 40 tx of radiation in 2009" (01/21/2013)   S/P aortic valve replacement with bioprosthetic valve 01/19/2013   Transcatheter Aortic Valve Replacement using 46m Sapien bioprosthetic tissue valve via transapical approach   [place] II [place]     Past Surgical History:  Procedure Laterality Date   BALLOON DILATION N/A 08/02/2020   Procedure: BALLOON DILATION;  Surgeon: [name] MD;  Location: [facility];  Service: Gastroenterology;  Laterality: N/A;   BIOPSY  10/03/2021   Procedure: BIOPSY;  Surgeon: [name] MD;  Location: [facility];  Service: Endoscopy;;   BIOPSY  12/14/2021   Procedure: BIOPSY;  Surgeon: Wilford Corner, MD;  Location: [facility];  Service: Endoscopy;;   CARDIAC CATHETERIZATION  12/16/2012   CARDIAC VALVE REPLACEMENT  01/19/2013   AVR   CATARACT EXTRACTION W/ INTRAOCULAR LENS  IMPLANT, BILATERAL      ESOPHAGOGASTRODUODENOSCOPY N/A 10/03/2021   Procedure: ESOPHAGOGASTRODUODENOSCOPY (EGD);  Surgeon: [name], MD;  Location: [facility];  Service: Endoscopy;  Laterality: N/A;   ESOPHAGOGASTRODUODENOSCOPY N/A 12/14/2021   Procedure: ESOPHAGOGASTRODUODENOSCOPY (EGD);  Surgeon: Wilford Corner, MD;  Location: [facility];  Service: Endoscopy;  Laterality: N/A;   ESOPHAGOGASTRODUODENOSCOPY (EGD) WITH PROPOFOL N/A 08/02/2020   Procedure: ESOPHAGOGASTRODUODENOSCOPY (EGD) WITH PROPOFOL;  Surgeon: [name], MD;  Location: [facility];  Service: Gastroenterology;  Laterality: N/A;   EYE SURGERY     INTRAOPERATIVE TRANSESOPHAGEAL ECHOCARDIOGRAM N/A 01/19/2013   Procedure: INTRAOPERATIVE TRANSESOPHAGEAL ECHOCARDIOGRAM;  Surgeon: [name], MD;  Location: [facility] [facility];  Service: Open Heart Surgery;  Laterality: N/A;   ORIF FOREARM FRACTURE Left 1952   "compound fx" (01/21/2013)   TRANSTHORACIC ECHOCARDIOGRAM  09/04/10, 09/07/08     Home Medications:  Prior to Admission medications   Medication Sig Start Date End Date Taking? Authorizing Provider  acetaminophen (TYLENOL) 650 MG CR tablet Take 650 mg by mouth daily before breakfast.    [provider]  Ascorbic Acid (VITAMIN C) 1000 MG tablet Take 1,000 mg by mouth daily.    [provider]  aspirin EC 81 MG tablet Take 1 tablet (81 mg total) by mouth daily. Swallow whole. 04/01/22   Medina-Vargas, Monina C, NP  cetirizine (ZYRTEC) 10 MG tablet Take 10 mg by mouth daily.    [provider]  cholecalciferol (VITAMIN D3) 25 MCG (1000 UNIT) tablet Take 1,000 Units by mouth 2 (two) times daily with a meal.    [provider]  dapagliflozin propanediol (FARXIGA) 10 MG TABS tablet Take 1 tablet (10 mg total) by mouth daily. 04/01/22   Medina-Vargas, Monina C, NP  ferrous sulfate 325 (65 FE) MG tablet Take 1 tablet (325 mg total) by mouth 2 (two) times daily with a meal. 04/01/22   Medina-Vargas, Monina C, NP  furosemide  (LASIX) 80 MG tablet Take 1.5 tablets (120 mg total) by mouth daily. 11/14/22   Chandrasekhar, Terisa Starr, MD  gabapentin (NEURONTIN) 100 MG capsule Take 1 capsule (100 mg total) by mouth at bedtime. 09/23/22   Patel, Donika K, DO  glipiZIDE (GLUCOTROL XL) 2.5 MG 24 hr tablet Take 2.5 mg by mouth every morning. 07/19/22   [provider]  KLOR-CON M20 20 MEQ tablet Take 40 mEq by mouth 3 (three) times a week. Jory Sims, Ludwig Clarks 08/21/22   [provider]  metFORMIN (GLUCOPHAGE) 1000 MG tablet Take 1 tablet (1,000 mg total) by mouth 2 (two) times daily with breakfast and lunch. 04/01/22   Medina-Vargas, Monina C, NP  metolazone (ZAROXOLYN) 2.5 MG tablet Take 1 tablet (2.5 mg total) by mouth every other day. 11/18/22   [name], MD  OXYGEN Inhale 2 L into the lungs as needed (shortness of breath).    [provider]  pantoprazole (PROTONIX) 40 MG tablet Take 1 tablet (40 mg total) by mouth 2 (two) times daily. Take  1 tab of 40 mg tab  by mouth twice a day till 02/16/22 then 1 tab of 40 mg by mouth daily starting 02/17/22 04/01/22   Medina-Vargas, Monina C, NP  rosuvastatin (CRESTOR) 5 MG  tablet TAKE 1 TABLET BY MOUTH ONCE DAILY AT  6PM 09/03/22   Elgie Collard, PA-C  Vitamin A 3 MG (10000 UT) TABS Take 1,000 mg by mouth daily at 6 (six) AM.    [provider]  XTANDI 40 MG capsule Take 160 mg by mouth daily. 09/13/22   [provider]    Inpatient Medications: Scheduled Meds:  aspirin EC  81 mg Oral Daily   dapagliflozin propanediol  10 mg Oral Daily   enzalutamide  160 mg Oral Daily   feeding supplement  237 mL Oral BID BM   furosemide  40 mg Intravenous Q12H   gabapentin  100 mg Oral QHS   heparin  5,000 Units Subcutaneous Q8H   insulin aspart  0-15 Units Subcutaneous TID WC   insulin aspart  0-5 Units Subcutaneous QHS   pantoprazole  40 mg Oral BID   rosuvastatin  5 mg Oral Daily   sodium chloride flush  3 mL Intravenous Q12H   Continuous  Infusions:  sodium chloride     PRN Meds: sodium chloride, acetaminophen, morphine injection, ondansetron (ZOFRAN) IV, sodium chloride flush  Allergies:    Allergies  Allergen Reactions   Amoxicillin Other (See Comments)    Burning of mouth, tongue and lips.  itchy, burning throat    Social History:   Social History   Socioeconomic History   Marital status: Married    Spouse name: [name]   Number of children: 2   Years of education: Not on file   Highest education level: High school graduate  Occupational History   Occupation: Retired  Tobacco Use   Smoking status: Former    Packs/day: 2.00    Years: 22.00    Total pack years: 44.00    Types: Cigarettes    Quit date: 09/11/1976    Years since quitting: 46.2   Smokeless tobacco: Never  Vaping Use   Vaping Use: Never used  Substance and Sexual Activity   Alcohol use: Not on file   Drug use: No   Sexual activity: Never  Other Topics Concern   Not on file  Social History Narrative   Are you right handed or left handed? Right Handed    Are you currently employed ?    What is your current occupation?   Do you live at home alone?   Who lives with you?    What type of home do you live in: 1 story or 2 story? Lives in a one story home        Social Determinants of Health   Financial Resource Strain: Not on file  Food Insecurity: No Food Insecurity (10/19/2022)   Hunger Vital Sign    Worried About Running Out of Food in the Last Year: Never true    Ran Out of Food in the Last Year: Never true  Transportation Needs: No Transportation Needs (10/19/2022)   PRAPARE - Hydrologist (Medical): No    Lack of Transportation (Non-Medical): No  Physical Activity: Not on file  Stress: Not on file  Social Connections: Not on file  Intimate Partner Violence: Not At Risk (10/19/2022)   Humiliation, Afraid, Rape, and Kick questionnaire    Fear of Current or Ex-Partner: No    Emotionally Abused: No     Physically Abused: No    Sexually Abused: No    Family History:    Family History  Problem Relation Age of Onset   Diabetes Mother  Melanoma Mother 51   Bone cancer Mother 82   Pancreatic cancer Father 68   Heart disease Father    Stroke Sister    Cancer Brother 66       ? brain cancer     ROS:  Please see the history of present illness.   All other ROS reviewed and negative.     Physical Exam/Data:   Vitals:   11/23/22 0454 11/23/22 0515 11/23/22 0600 11/23/22 0630  BP:  1'08/72 92/68 96/65 '$  Pulse: (!) 107 (!) 110 (!) 104 (!) 108  Resp:  '18 16 13  '$ Temp:      TempSrc:      SpO2: 98% 97% 97% 97%    Intake/Output Summary (Last 24 hours) at 11/23/2022 0731 Last data filed at 11/23/2022 0410 Gross per 24 hour  Intake 103 ml  Output 500 ml  Net -397 ml      11/06/2022    8:29 AM 10/21/2022    4:53 AM 10/20/2022    5:11 AM  Last 3 Weights  Weight (lbs) 157 lb 146 lb 9.7 oz 148 lb 5.9 oz  Weight (kg) 71.215 kg 66.5 kg 67.3 kg     There is no height or weight on file to calculate BMI.  General:  Frail and ill appearing HEENT: normal Neck: significant JVP, up to mandible Vascular: No carotid bruits; Distal pulses 2+ bilaterally Cardiac:  irregularly irregular;  harsh systolic murmur LLSB. Lungs:  breath sounds diminished in bilateral bases with crackles.  Abd: moderately firm and distended Ext: bilateral lower extremities are cool to touch with 3+ pitting edema. Cap refill >3 seconds upper extremities. Musculoskeletal:  No deformities, BUE and BLE strength normal and equal Skin: Scaling/red skin on bilateral shins Neuro:  CNs 2-12 intact, no focal abnormalities noted Psych:  Lethargic, sleepy  EKG:  The EKG was personally reviewed and demonstrates:  atrial fibrillation with LBBB morphology Telemetry:  Telemetry was personally reviewed and demonstrates:  atrial fibrillation  Relevant CV Studies:  10/19/22 TTE  IMPRESSIONS     1. Left ventricular  ejection fraction, by estimation, is 45 to 50%. The  left ventricle has mildly decreased function. The left ventricle  demonstrates global hypokinesis. Left ventricular diastolic parameters are  indeterminate. There is the  interventricular septum is flattened in systole and diastole, consistent  with right ventricular pressure and volume overload.   2. Right ventricular systolic function is mildly reduced. The right  ventricular size is moderately enlarged. Tricuspid regurgitation signal is  inadequate for assessing PA pressure. The estimated right ventricular  systolic pressure is 27.0 mmHg.   3. Left atrial size was severely dilated.   4. Right atrial size was severely dilated.   5. The mitral valve is grossly normal. Moderate mitral valve  regurgitation. Moderate mitral annular calcification.   6. Tricuspid valve regurgitation is severe.   7. The aortic valve has been repaired/replaced. Aortic valve  regurgitation is mild. There is a 26 mm Sapien bioprosthetic valve present  in the aortic position. Aortic valve mean gradient measures 6.2 mmHg. Mild  PVL unchanged from prior.   8. The inferior vena cava is dilated in size with <50% respiratory  variability, suggesting right atrial pressure of 15 mmHg.   Comparison(s): Prior images reviewed side by side. LVEF is decreased from  prior (this study uses echo contrast). MR has improved.   FINDINGS   Left Ventricle: Left ventricular ejection fraction, by estimation, is 45  to 50%. The left ventricle has  mildly decreased function. The left  ventricle demonstrates global hypokinesis. The left ventricular internal  cavity size was normal in size. There is   no left ventricular hypertrophy. The interventricular septum is flattened  in systole and diastole, consistent with right ventricular pressure and  volume overload. Left ventricular diastolic parameters are indeterminate.   Right Ventricle: The right ventricular size is moderately  enlarged. No  increase in right ventricular wall thickness. Right ventricular systolic  function is mildly reduced. Tricuspid regurgitation signal is inadequate  for assessing PA pressure. The  tricuspid regurgitant velocity is 2.56 m/s, and with an assumed right  atrial pressure of 15 mmHg, the estimated right ventricular systolic  pressure is 56.3 mmHg.   Left Atrium: Left atrial size was severely dilated.   Right Atrium: Right atrial size was severely dilated.   Pericardium: Trivial pericardial effusion is present. The pericardial  effusion is anterior to the right ventricle.   Mitral Valve: The mitral valve is grossly normal. Moderate mitral annular  calcification. Moderate mitral valve regurgitation.   Tricuspid Valve: The tricuspid valve is normal in structure. Tricuspid  valve regurgitation is severe. No evidence of tricuspid stenosis.   Aortic Valve: The aortic valve has been repaired/replaced. Aortic valve  regurgitation is mild. Aortic valve mean gradient measures 6.2 mmHg.  Aortic valve peak gradient measures 11.8 mmHg. Aortic valve area, by VTI  measures 1.03 cm. There is a 26 mm  Sapien bioprosthetic valve present in the aortic position.   Pulmonic Valve: The pulmonic valve was normal in structure. Pulmonic valve  regurgitation is not visualized. No evidence of pulmonic stenosis.   Aorta: The aortic root and ascending aorta are structurally normal, with  no evidence of dilitation.   Venous: The inferior vena cava is dilated in size with less than 50%  respiratory variability, suggesting right atrial pressure of 15 mmHg.   IAS/Shunts: No atrial level shunt detected by color flow Doppler.    Laboratory Data:  High Sensitivity Troponin:  No results for input(s): "TROPONINIHS" in the last 720 hours.   Chemistry Recent Labs  Lab 12/02/2022 1459 11/23/22 0211  NA 126* 127*  K 3.5 3.5  CL 92* 92*  CO2 15* 17*  GLUCOSE 100* 58*  BUN 72* 75*  CREATININE 2.02*  2.10*  CALCIUM 8.4* 8.5*  MG  --  2.0  GFRNONAA 31* 30*  ANIONGAP 19* 18*    Recent Labs  Lab 11/15/2022 1459  PROT 6.2*  ALBUMIN 2.8*  AST 47*  ALT 14  ALKPHOS 123  BILITOT 3.0*   Lipids No results for input(s): "CHOL", "TRIG", "HDL", "LABVLDL", "LDLCALC", "CHOLHDL" in the last 168 hours.  Hematology Recent Labs  Lab 11/19/2022 1459  WBC 8.2  RBC 3.26*  HGB 10.5*  HCT 28.9*  MCV 88.7  MCH 32.2  MCHC 36.3*  RDW 15.9*  PLT 215   Thyroid No results for input(s): "TSH", "FREET4" in the last 168 hours.  BNP Recent Labs  Lab 12/03/2022 1459  BNP 1,471.0*    DDimer No results for input(s): "DDIMER" in the last 168 hours.   Radiology/Studies:  DG Chest 2 View  Result Date: 11/06/2022 CLINICAL DATA:  Possible sepsis. EXAM: CHEST - 2 VIEW COMPARISON:  Chest x-ray dated October 18, 2022. FINDINGS: Stable cardiomegaly status post TAVR. No focal consolidation, pleural effusion, or pneumothorax. No acute osseous abnormality. IMPRESSION: No active cardiopulmonary disease. Electronically Signed   By: Titus Dubin M.D.   On: 11/19/2022 15:37     Assessment  and Plan:   Acute on Chronic Systolic and Diastolic CHF  Patient admitted with shortness of breath, lethargy, lower extremity edema. Recently hospitalized in December as well with HF exacerbation. Troponins were elevated in the setting of acute decompensated CHF and cardiorenal syndrome. TTE 10/19/2022 revealed LVEF 45 to 50%, global HK of LV, indeterminate diastolic parameters, interventricular septum flattened in systole and diastole, consistent with right ventricular pressure and volume overload. Mildly reduced RV function, moderately enlarged RV cavity, RVSP 41.2 mmHg, severe biatrial enlargement, moderate MR, severe TR, TAVR valve in position with mild PVL unchanged from prior echo, mild AI. LVEF decreased, and MR improved from prior echo. Ultimately ischemic workup was deferred given CKD with AKI. This admission patient found  with elevated lactic acid, 4.3-> 2.6. BNP elevated to 1471. Physical exam and labs concerning for hypoperfusion.  Continue IV diuresis with close monitoring of urine output. Lactate improving. Increasing lasix to '80mg'$  BID.  Repeat echocardiogram Continue Farxiga Unable to utilize MRA/ACEi/ARB/ARNI 2/2 AKI and low BP Given recurrent admissions for CHF and limited options for management, would strongly suggest palliative care involvement.  Severe Tricuspid Regurg Moderate to Severe Mitral Regurg  Patient with moderate MR, severe TR on 10/19/22 TTE. Has been seen by structural heart and is not felt to be a candidate for valve repair.   AKI on CKD  Creatinine elevated to 2.1. Very likely cardio-renal etiology with elevated lactic acid, evidence of hypoperfusion. Continue diuresis and closely follow creatinine.  Permanent atrial fibrillation Secondary hypercoagulable state CHA2DS2-VASc Score = 5   Patient with longstanding afib has not been on anticoagulation due to hx frequent falls and anemia. Unfortunately not a Watchman candidate. Has been on metoprolol in the past for rate control but this has been held since December admission due to low BP.  Continue to hold beta blockers at this time  Abdominal distension  Patient with persistent abdominal distension noted in December. Abdominal MRI notable for cirrhosis. Followed by Dr. Watt Climes.  Hyperlipidemia  Continue home statin  Hypertension  Patient hypotensive today as appears has been recent trend since mid December admission. Continue to monitor closely as he continues with diuresis.  Chronic anemia  Patient with chronic iron deficiency anemia. HGB stable today.  Per primary team:  DM type II Hyponatremia, hypochloremia, hypocalcemia  Risk Assessment/Risk Scores:        New York Heart Association (NYHA) Functional Class NYHA Class IV  CHA2DS2-VASc Score = 5   This indicates a 7.2% annual risk of stroke. The patient's  score is based upon: CHF History: 1 HTN History: 1 Diabetes History: 0 Stroke History: 0 Vascular Disease History: 1 Age Score: 2 Gender Score: 0         For questions or updates, please contact [place] Please consult [email or web address] for contact info under    Signed, Lily Kocher, PA-C  11/23/2022 7:31 AM  Patient seen and examined.  Agree with above dictation.  [name] is an 89 year old male with a history of severe aortic stenosis status post TAVR, chronic combined systolic and diastolic heart failure, permanent atrial fibrillation, hypertension, PAD who we are consulted for evaluation of heart failure at the request of Dr. Claria Dice.  He was admitted last month with decompensated heart failure.  Echo 10/19/2022 showed EF 45 to 50%, global hypokinesis, mildly reduced RV function, moderate MR, severe TR, normal functioning TAVR valve with mild paravalvular leak.  He responded to IV Lasix and was discharged.  He is now presenting with worsening  shortness of breath and volume overload.  His Lasix had been increased to 120 mg twice daily and metolazone 2.5 mg every other day was added.  Despite this he continued to put on fluid.  In the ED, vital signs notable for BP 110/61, SpO2 95% on room air, pulse 116.  Labs notable for creatinine 2.0 (increased from 1.65 on 1/3), BNP 1471, lactate 4.3 > 2.6, hemoglobin 10.5, sodium 126.  EKG shows left bundle branch block, rate 106, atrial fibrillation.  On exam, patient is alert and orientedx2, irregular, tachycardic, 2 out of 6 systolic murmur, bibasilar crackles, 2+ lower extremity edema, + JVD.  Echocardiogram today shows EF 35 to 40% with regional wall motion abnormalities (akinetic apex and hypokinetic inferior wall), septal flattening in systole/diastole consistent with RV pressure and volume overload, moderate RV systolic dysfunction, severe TR, moderate to severe MR, normal functioning TAVR valve with mild perivalvular leak.  He has  previously had evaluation by structural heart team and not a candidate for mitral or tricuspid valve interventions due to age/fragility.  He has had recurrent admissions with decompensated heart failure.  Would recommend consulting palliative medicine.  We will increase diuresis to Lasix 80 mg twice daily.  His EF is reduced but unable to add GDMT due to low BP.  He remains in A-fib but is not on anticoagulation given his issues with anemia and falls.   Donato Heinz, MD

## 2022-11-23 NOTE — ED Notes (Signed)
ED TO INPATIENT HANDOFF REPORT  ED Nurse Name and Phone #:  [phone]  S Name/Age/Gender [name] 89 y.o. male Room/Bed: 042C/042C  Code Status   Code Status: DNR  Home/SNF/Other Home Patient oriented to: self and place Is this baseline? No   Triage Complete: Triage complete  Chief Complaint Acute on chronic congestive heart failure ([place]) [I50.9]  Triage Note Patient BIB [facility] from home where he lives with daughter for evaluation of decreased level of consciousness for the last few days. Patient arouses to voice, oriented to self. Has multiple skin tears of varying degrees of healing, edematous and weeping bilateral lower extremities.   Allergies Allergies  Allergen Reactions   Amoxil [Amoxicillin] Itching and Other (See Comments)    Burning of mouth, tongue and lips. Itchy, burning throat    Level of Care/Admitting Diagnosis ED Disposition     ED Disposition  Admit   Condition  --   Comment  Hospital Area: [facility] [[id number]]  Level of Care: Telemetry Cardiac [[id number]]  May admit patient to [facility] or [facility] if equivalent level of care is available:: Yes  Covid Evaluation: Confirmed COVID Negative  Diagnosis: Acute on chronic congestive heart failure [facility]) [[id number]]  Admitting Physician: Quintella Baton [[id number]]  Attending Physician: Quintella Baton [[id number]]  Certification:: I certify this patient will need inpatient services for at least 2 midnights  Estimated Length of Stay: 2          B Medical/Surgery History Past Medical History:  Diagnosis Date   Anemia    LOW PLATELETS OTHER DAY  PER PT   Anticoagulated on Coumadin 12/21/2012   Aortic stenosis    Arthritis    "left wrist; back sometimes" (01/21/2013)   Atrial fibrillation ([place])    [name], DR COOPER   Bradycardia    Exertional shortness of breath    Heart murmur    "I've had it for years; runs in the family on daddy's side" (01/21/2013)   HTN (hypertension)     Hyperlipemia 12/21/2012   Prostate cancer ([place])    "had 40 tx of radiation in 2009" (01/21/2013)   S/P aortic valve replacement with bioprosthetic valve 01/19/2013   Transcatheter Aortic Valve Replacement using 25m Sapien bioprosthetic tissue valve via transapical approach   Type II diabetes mellitus ([place]    Past Surgical History:  Procedure Laterality Date   BALLOON DILATION N/A 08/02/2020   Procedure: BALLOON DILATION;  Surgeon: [name] MD;  [facility];  Service: Gastroenterology;  Laterality: N/A;   BIOPSY  10/03/2021   Procedure: BIOPSY;  Surgeon: [name] MD;  [facility];  Service: Endoscopy;;   BIOPSY  12/14/2021   Procedure: BIOPSY;  Surgeon: [name] MD;  [place] [facility]  Service: Endoscopy;;   CARDIAC CATHETERIZATION  12/16/2012   CARDIAC VALVE REPLACEMENT  01/19/2013   AVR   CATARACT EXTRACTION W/ INTRAOCULAR LENS  IMPLANT, BILATERAL     ESOPHAGOGASTRODUODENOSCOPY N/A 10/03/2021   Procedure: ESOPHAGOGASTRODUODENOSCOPY (EGD);  Surgeon: [name] MD;  [facility];  Service: Endoscopy;  Laterality: N/A;   ESOPHAGOGASTRODUODENOSCOPY N/A 12/14/2021   Procedure: ESOPHAGOGASTRODUODENOSCOPY (EGD);  Surgeon: [name] MD;  Location: [facility]  Service: Endoscopy;  Laterality: N/A;   ESOPHAGOGASTRODUODENOSCOPY (EGD) WITH PROPOFOL N/A 08/02/2020   Procedure: ESOPHAGOGASTRODUODENOSCOPY (EGD) WITH PROPOFOL;  Surgeon: [name] MD;  [facility];  Service: Gastroenterology;  Laterality: N/A;   EYE SURGERY     INTRAOPERATIVE TRANSESOPHAGEAL ECHOCARDIOGRAM N/A 01/19/2013  Procedure: INTRAOPERATIVE TRANSESOPHAGEAL ECHOCARDIOGRAM;  Surgeon: [name], MD;  Location: [place];  Service: Open Heart Surgery;  Laterality: N/A;   ORIF FOREARM FRACTURE Left 1954   "compound fx" (01/21/2013)   TRANSTHORACIC ECHOCARDIOGRAM  09/04/10, 09/07/08     A IV Location/Drains/Wounds Patient Lines/Drains/Airways Status      Active Line/Drains/Airways     Name Placement date Placement time Site Days   Peripheral IV 11/23/22 20 G Left Antecubital 11/23/22  0000  Antecubital  less than 1   Pressure Injury 10/02/21 Coccyx Medial Stage 3 -  Full thickness tissue loss. Subcutaneous fat may be visible but bone, tendon or muscle are NOT exposed. 10/02/21  1425  -- 417   Pressure Injury 03/01/22 Coccyx Medial Stage 2 -  Partial thickness loss of dermis presenting as a shallow open injury with a red, pink wound bed without slough. 03/01/22  1600  -- 267   Pressure Injury 10/19/22 Buttocks Left Stage 2 -  Partial thickness loss of dermis presenting as a shallow open injury with a red, pink wound bed without slough. pink, shallow 10/19/22  1800  -- 35   Wound / Incision (Open or Dehisced) 10/02/21 Venous stasis ulcer Pretibial Right;Left 10/02/21  1426  Pretibial  417   Wound / Incision (Open or Dehisced) 03/01/22 Venous stasis ulcer Tibial Posterior;Right 03/01/22  1601  Tibial  267   Wound / Incision (Open or Dehisced) 03/01/22 Skin tear Arm Anterior;Distal;Left;Upper 03/01/22  1602  Arm  267            Intake/Output Last 24 hours  Intake/Output Summary (Last 24 hours) at 11/23/2022 1358 Last data filed at 11/23/2022 1105 Gross per 24 hour  Intake 113 ml  Output 500 ml  Net -387 ml    Labs/Imaging Results for orders placed or performed during the hospital encounter of 11/12/2022 (from the past 48 hour(s))  Comprehensive metabolic panel     Status: Abnormal   Collection Time: 11/18/2022  2:59 PM  Result Value Ref Range   Sodium 126 (L) 135 - 145 mmol/L   Potassium 3.5 3.5 - 5.1 mmol/L   Chloride 92 (L) 98 - 111 mmol/L   CO2 15 (L) 22 - 32 mmol/L   Glucose, Bld 100 (H) 70 - 99 mg/dL    Comment: Glucose reference range applies only to samples taken after fasting for at least 8 hours.   BUN 72 (H) 8 - 23 mg/dL   Creatinine, Ser 2.02 (H) 0.61 - 1.24 mg/dL   Calcium 8.4 (L) 8.9 - 10.3 mg/dL   Total Protein 6.2  (L) 6.5 - 8.1 g/dL   Albumin 2.8 (L) 3.5 - 5.0 g/dL   AST 47 (H) 15 - 41 U/L   ALT 14 0 - 44 U/L   Alkaline Phosphatase 123 38 - 126 U/L   Total Bilirubin 3.0 (H) 0.3 - 1.2 mg/dL   GFR, Estimated 31 (L) >60 mL/min    Comment: (NOTE) Calculated using the CKD-EPI Creatinine Equation (2021)    Anion gap 19 (H) 5 - 15    Comment: Performed at [facility], [place] [address] [place], [place] [address]  Lactic acid, plasma     Status: Abnormal   Collection Time: 12/03/2022  2:59 PM  Result Value Ref Range   Lactic Acid, Venous 4.3 (HH) 0.5 - 1.9 mmol/L    Comment: CRITICAL RESULT CALLED TO, READ BACK BY AND VERIFIED WITH D.GUILEAULT,RN '@[id number]'$  11/15/2022 [email or web address].J Performed at [facility],  [address], [place], [place] [address]   CBC with Differential     Status: Abnormal   Collection Time: 11/07/2022  2:59 PM  Result Value Ref Range   WBC 8.2 4.0 - 10.5 K/uL   RBC 3.26 (L) 4.22 - 5.81 MIL/uL   Hemoglobin 10.5 (L) 13.0 - 17.0 g/dL   HCT 28.9 (L) 39.0 - 52.0 %   MCV 88.7 80.0 - 100.0 fL   MCH 32.2 26.0 - 34.0 pg   MCHC 36.3 (H) 30.0 - 36.0 g/dL   RDW 15.9 (H) 11.5 - 15.5 %   Platelets 215 150 - 400 K/uL   nRBC 0.0 0.0 - 0.2 %   Neutrophils Relative % 82 %   Neutro Abs 6.7 1.7 - 7.7 K/uL   Lymphocytes Relative 5 %   Lymphs Abs 0.4 (L) 0.7 - 4.0 K/uL   Monocytes Relative 12 %   Monocytes Absolute 1.0 0.1 - 1.0 K/uL   Eosinophils Relative 0 %   Eosinophils Absolute 0.0 0.0 - 0.5 K/uL   Basophils Relative 1 %   Basophils Absolute 0.0 0.0 - 0.1 K/uL   Immature Granulocytes 0 %   Abs Immature Granulocytes 0.03 0.00 - 0.07 K/uL    Comment: Performed at [facility], [place] [address], [place], [place] [address]  Protime-INR     Status: Abnormal   Collection Time: 12/01/2022  2:59 PM  Result Value Ref Range   Prothrombin Time 26.4 (H) 11.4 - 15.2 seconds   INR 2.5 (H) 0.8 - 1.2    Comment: (NOTE) INR goal varies based on device and disease states. Performed at [facility], [place] [address] [place], [place] [address]   Culture, blood (Routine x 2)     Status: None (Preliminary result)   Collection Time: 11/29/2022  2:59 PM   Specimen: BLOOD RIGHT ARM  Result Value Ref Range   Specimen Description BLOOD RIGHT ARM    Special Requests      BOTTLES DRAWN AEROBIC AND ANAEROBIC Blood Culture results may not be optimal due to an inadequate volume of blood received in culture bottles   Culture      NO GROWTH < 24 HOURS Performed at [facility], [place] [address], [place], [place] [address]    Report Status PENDING   Brain natriuretic peptide     Status: Abnormal   Collection Time: 11/21/2022  2:59 PM  Result Value Ref Range   B Natriuretic Peptide 1,471.0 (H) 0.0 - 100.0 pg/mL    Comment: Performed at [facility] [address], [place], [place] [address]  Urinalysis, Routine w reflex microscopic Urine, Clean Catch     Status: Abnormal   Collection Time: 11/15/2022  6:15 PM  Result Value Ref Range   Color, Urine YELLOW YELLOW   APPearance HAZY (A) CLEAR   Specific Gravity, Urine 1.010 1.005 - 1.030   pH 5.0 5.0 - 8.0   Glucose, UA 150 (A) NEGATIVE mg/dL   Hgb urine dipstick NEGATIVE NEGATIVE   Bilirubin Urine NEGATIVE NEGATIVE   Ketones, ur NEGATIVE NEGATIVE mg/dL   Protein, ur 30 (A) NEGATIVE mg/dL   Nitrite NEGATIVE NEGATIVE   Leukocytes,Ua NEGATIVE NEGATIVE   RBC / HPF 0-5 0 - 5 RBC/hpf   WBC, UA 0-5 0 - 5 WBC/hpf   Bacteria, UA RARE (A) NONE SEEN   Squamous Epithelial / HPF 0-5 0 - 5 /HPF   Ca Oxalate Crys, UA PRESENT  Comment: Performed at [facility], [place] [address] [place], [place] [address]  Culture, blood (Routine x 2)     Status: None (Preliminary result)   Collection Time: 11/15/2022  6:21 PM   Specimen: BLOOD  Result Value Ref Range   Specimen Description BLOOD RIGHT ANTECUBITAL    Special Requests      BOTTLES DRAWN AEROBIC AND ANAEROBIC Blood Culture adequate volume   Culture      NO GROWTH < 24  HOURS Performed at [facility], [place] [address], [place], [place] [address]    Report Status PENDING   Lactic acid, plasma     Status: Abnormal   Collection Time: 11/07/2022  7:48 PM  Result Value Ref Range   Lactic Acid, Venous 2.6 (HH) 0.5 - 1.9 mmol/L    Comment: CRITICAL VALUE NOTED.  VALUE IS CONSISTENT WITH PREVIOUSLY REPORTED AND CALLED VALUE. Performed at [facility], [place] [address] [place], [place] [address]   CBG monitoring, ED     Status: Abnormal   Collection Time: 11/23/22  2:09 AM  Result Value Ref Range   Glucose-Capillary 55 (L) 70 - 99 mg/dL    Comment: Glucose reference range applies only to samples taken after fasting for at least 8 hours.  Basic metabolic panel     Status: Abnormal   Collection Time: 11/23/22  2:11 AM  Result Value Ref Range   Sodium 127 (L) 135 - 145 mmol/L   Potassium 3.5 3.5 - 5.1 mmol/L   Chloride 92 (L) 98 - 111 mmol/L   CO2 17 (L) 22 - 32 mmol/L   Glucose, Bld 58 (L) 70 - 99 mg/dL    Comment: Glucose reference range applies only to samples taken after fasting for at least 8 hours.   BUN 75 (H) 8 - 23 mg/dL   Creatinine, Ser 2.10 (H) 0.61 - 1.24 mg/dL   Calcium 8.5 (L) 8.9 - 10.3 mg/dL   GFR, Estimated 30 (L) >60 mL/min    Comment: (NOTE) Calculated using the CKD-EPI Creatinine Equation (2021)    Anion gap 18 (H) 5 - 15    Comment: Performed at [facility] [address], [place], [place] [address]  Magnesium     Status: None   Collection Time: 11/23/22  2:11 AM  Result Value Ref Range   Magnesium 2.0 1.7 - 2.4 mg/dL    Comment: Performed at [facility] [address] [place], [place] [address]  Hemoglobin A1c     Status: Abnormal   Collection Time: 11/23/22  2:11 AM  Result Value Ref Range   Hgb A1c MFr Bld 7.3 (H) 4.8 - 5.6 %    Comment: (NOTE) Pre diabetes:          5.7%-6.4%  Diabetes:              >6.4%  Glycemic control for   <7.0% adults with diabetes    Mean Plasma Glucose 162.81 mg/dL     Comment: Performed at [facility] [address], [place], [place] [address]  CBG monitoring, ED     Status: Abnormal   Collection Time: 11/23/22  2:53 AM  Result Value Ref Range   Glucose-Capillary 69 (L) 70 - 99 mg/dL    Comment: Glucose reference range applies only to samples taken after fasting for at least 8 hours.  CBG monitoring, ED     Status: Abnormal   Collection Time: 11/23/22  3:12  AM  Result Value Ref Range   Glucose-Capillary 101 (H) 70 - 99 mg/dL    Comment: Glucose reference range applies only to samples taken after fasting for at least 8 hours.  CBG monitoring, ED     Status: None   Collection Time: 11/23/22  7:41 AM  Result Value Ref Range   Glucose-Capillary 92 70 - 99 mg/dL    Comment: Glucose reference range applies only to samples taken after fasting for at least 8 hours.  CBG monitoring, ED     Status: Abnormal   Collection Time: 11/23/22  1:02 PM  Result Value Ref Range   Glucose-Capillary 158 (H) 70 - 99 mg/dL    Comment: Glucose reference range applies only to samples taken after fasting for at least 8 hours.   ECHOCARDIOGRAM COMPLETE  Result Date: 11/23/2022    ECHOCARDIOGRAM REPORT   Patient Name:   TYJAY GALINDO Date of Exam: 11/23/2022 Medical Rec #:  [id number]         Height:       68.5 in Accession #:    [id number]        Weight:       157.0 lb Date of Birth:  [birth year]          BSA:          1.854 m Patient Age:    7 years          BP:           96/65 mmHg Patient Gender: M                 HR:           106 bpm. Exam Location:  Inpatient Procedure: 2D Echo, Cardiac Doppler, Color Doppler and Intracardiac            Opacification Agent STAT ECHO Indications:    CHF  History:        Patient has prior history of Echocardiogram examinations, most                 recent 10/19/2022. Arrythmias:Atrial Fibrillation,                 Signs/Symptoms:Dyspnea; Risk Factors:Diabetes and Dyslipidemia.                 CKD. 26 mm Sapien bioprosthetic valve is  present                 in the aortic position. Procedure date: 01/19/13.                 Aortic Valve: 26 mm Edwards Sapien prosthetic, stented (TAVR)                 valve is present in the aortic position.  Sonographer:    Clayton Lefort RDCS (AE) Referring Phys: Lily Kocher  Sonographer Comments: Technically difficult study due to poor echo windows. IMPRESSIONS  1. Left ventricular ejection fraction, by estimation, is 35 to 40%. The left ventricle has moderately decreased function. The left ventricle demonstrates regional wall motion abnormalities (see scoring diagram/findings for description). There is mild left ventricular hypertrophy. Left ventricular diastolic parameters are indeterminate. There is the interventricular septum is flattened in systole and diastole, consistent with right ventricular pressure and volume overload.  2. Right ventricular systolic function is moderately reduced. The right ventricular size is mildly enlarged. There is mildly elevated pulmonary artery systolic pressure. The estimated right ventricular systolic pressure is 93.8 mmHg.  3. Left atrial size was moderately dilated.  4. Right atrial size was severely dilated.  5. The mitral valve is degenerative. Moderate to severe mitral valve regurgitation. No evidence of mitral stenosis. Moderate mitral annular calcification.  6. The tricuspid valve is abnormal. Tricuspid valve regurgitation is severe.  7. The aortic valve has been repaired/replaced. Aortic valve regurgitation is mild. There is a 26 mm Edwards Sapien prosthetic (TAVR) valve present in the aortic position. Aortic valve mean gradient measures 4.0 mmHg.  8. The inferior vena cava is dilated in size with <50% respiratory variability, suggesting right atrial pressure of 15 mmHg. FINDINGS  Left Ventricle: Left ventricular ejection fraction, by estimation, is 35 to 40%. The left ventricle has moderately decreased function. The left ventricle demonstrates regional wall motion  abnormalities. Definity contrast agent was given IV to delineate the left ventricular endocardial borders. The left ventricular internal cavity size was normal in size. There is mild left ventricular hypertrophy. The interventricular septum is flattened in systole and diastole, consistent with right ventricular pressure and volume overload. Left ventricular diastolic parameters are indeterminate.  LV Wall Scoring: The apex is akinetic. The inferior septum, entire inferior wall, and posterior wall are hypokinetic. The entire anterior wall, antero-lateral wall, anterior septum, and apical lateral segment are normal. Right Ventricle: The right ventricular size is mildly enlarged. Right vetricular wall thickness was not well visualized. Right ventricular systolic function is moderately reduced. There is mildly elevated pulmonary artery systolic pressure. The tricuspid  regurgitant velocity is 2.51 m/s, and with an assumed right atrial pressure of 15 mmHg, the estimated right ventricular systolic pressure is 76.2 mmHg. Left Atrium: Left atrial size was moderately dilated. Right Atrium: Right atrial size was severely dilated. Pericardium: There is no evidence of pericardial effusion. Mitral Valve: The mitral valve is degenerative in appearance. Moderate mitral annular calcification. Moderate to severe mitral valve regurgitation. No evidence of mitral valve stenosis. Tricuspid Valve: The tricuspid valve is abnormal. Tricuspid valve regurgitation is severe. Aortic Valve: The aortic valve has been repaired/replaced. Aortic valve regurgitation is mild. Aortic valve mean gradient measures 4.0 mmHg. Aortic valve peak gradient measures 8.0 mmHg. Aortic valve area, by VTI measures 3.05 cm. There is a 26 mm Edwards Sapien prosthetic, stented (TAVR) valve present in the aortic position. Pulmonic Valve: The pulmonic valve was not well visualized. Pulmonic valve regurgitation is trivial. Aorta: The aortic root is normal in size and  structure. Venous: The inferior vena cava is dilated in size with less than 50% respiratory variability, suggesting right atrial pressure of 15 mmHg. IAS/Shunts: The interatrial septum was not well visualized.  LEFT VENTRICLE PLAX 2D LVIDd:         4.90 cm LVIDs:         4.00 cm LV PW:         1.30 cm LV IVS:        1.40 cm LVOT diam:     2.60 cm LV SV:         63 LV SV Index:   34 LVOT Area:     5.31 cm  RIGHT VENTRICLE            IVC RV Basal diam:  3.40 cm    IVC diam: 2.50 cm RV S prime:     6.20 cm/s TAPSE (M-mode): 0.6 cm LEFT ATRIUM           Index        RIGHT ATRIUM  Index LA diam:      4.20 cm 2.27 cm/m   RA Area:     33.20 cm LA Vol (A4C): 87.0 ml 46.93 ml/m  RA Volume:   128.00 ml 69.04 ml/m  AORTIC VALVE AV Area (Vmax):    3.09 cm AV Area (Vmean):   2.99 cm AV Area (VTI):     3.05 cm AV Vmax:           141.00 cm/s AV Vmean:          94.100 cm/s AV VTI:            0.207 m AV Peak Grad:      8.0 mmHg AV Mean Grad:      4.0 mmHg LVOT Vmax:         82.10 cm/s LVOT Vmean:        53.000 cm/s LVOT VTI:          0.119 m LVOT/AV VTI ratio: 0.57  AORTA Ao Root diam: 3.30 cm MR Peak grad:    70.2 mmHg    TRICUSPID VALVE MR Mean grad:    42.0 mmHg    TR Peak grad:   25.2 mmHg MR Vmax:         419.00 cm/s  TR Vmax:        251.00 cm/s MR Vmean:        306.0 cm/s MR PISA:         3.08 cm     SHUNTS MR PISA Eff ROA: 29 mm       Systemic VTI:  0.12 m MR PISA Radius:  0.70 cm      Systemic Diam: 2.60 cm Oswaldo Milian MD Electronically signed by Oswaldo Milian MD Signature Date/Time: 11/23/2022/11:32:54 AM    Final    DG Chest 2 View  Result Date: 12/02/2022 CLINICAL DATA:  Possible sepsis. EXAM: CHEST - 2 VIEW COMPARISON:  Chest x-ray dated October 18, 2022. FINDINGS: Stable cardiomegaly status post TAVR. No focal consolidation, pleural effusion, or pneumothorax. No acute osseous abnormality. IMPRESSION: No active cardiopulmonary disease. Electronically Signed   By: Titus Dubin  M.D.   On: 11/24/2022 15:37    Pending Labs Unresulted Labs (From admission, onward)     Start     Ordered   11/24/22 1740  Basic metabolic panel  Tomorrow morning,   R        11/23/22 1126   11/23/22 1123  Lactic acid, plasma  Once,   R        11/23/22 1122   11/23/22 0500  CBC with Differential/Platelet  Tomorrow morning,   R        11/20/2022 2356   11/23/22 0445  CBC with Differential/Platelet  Once,   R        11/23/22 0445   11/05/2022 2354  CBC  (heparin)  Once,   R       Comments: Baseline for heparin therapy IF NOT ALREADY DRAWN.  Notify MD if PLT < 100 K.    11/12/2022 2356   11/21/2022 2354  Creatinine, serum  (heparin)  Once,   R       Comments: Baseline for heparin therapy IF NOT ALREADY DRAWN.    11/12/2022 2356            Vitals/Pain Today's Vitals   11/23/22 0630 11/23/22 1015 11/23/22 1100 11/23/22 1105  BP: 96/65   91/63  Pulse: (!) 108 (!) 25 (!) 45 (!) 58  Resp: 13  $'16 18 19  'S$ Temp:    97.7 F (36.5 C)  TempSrc:    Axillary  SpO2: 97% (!) 67% (!) 71% 99%  PainSc:        Isolation Precautions No active isolations  Medications Medications  morphine (PF) 2 MG/ML injection 1 mg (1 mg Intravenous Given 11/23/22 0806)  sodium chloride flush (NS) 0.9 % injection 3 mL (3 mLs Intravenous Given 11/23/22 1105)  sodium chloride flush (NS) 0.9 % injection 3 mL (has no administration in time range)  0.9 %  sodium chloride infusion (has no administration in time range)  acetaminophen (TYLENOL) tablet 650 mg (650 mg Oral Given 11/23/22 0802)  ondansetron (ZOFRAN) injection 4 mg (has no administration in time range)  heparin injection 5,000 Units (5,000 Units Subcutaneous Given 11/23/22 0607)  aspirin EC tablet 81 mg (81 mg Oral Given 11/23/22 1104)  dapagliflozin propanediol (FARXIGA) tablet 10 mg (10 mg Oral Given 11/23/22 1104)  gabapentin (NEURONTIN) capsule 100 mg (100 mg Oral Given 11/23/22 0201)  rosuvastatin (CRESTOR) tablet 5 mg (5 mg Oral Given 11/23/22 1104)   enzalutamide (XTANDI) capsule 160 mg (160 mg Oral Not Given 11/23/22 1105)  insulin aspart (novoLOG) injection 0-15 Units ( Subcutaneous Not Given 11/23/22 0753)  insulin aspart (novoLOG) injection 0-5 Units ( Subcutaneous Not Given 11/23/22 0210)  pantoprazole (PROTONIX) EC tablet 40 mg (40 mg Oral Given 11/23/22 1104)  feeding supplement (ENSURE ENLIVE / ENSURE PLUS) liquid 237 mL (237 mLs Oral Given 11/23/22 1116)  furosemide (LASIX) injection 80 mg (80 mg Intravenous Given 11/23/22 1104)  perflutren lipid microspheres (DEFINITY) IV suspension (2 mLs Intravenous Given 11/23/22 1115)  morphine (PF) 2 MG/ML injection 1 mg (1 mg Intravenous Given 11/23/22 0053)  furosemide (LASIX) injection 40 mg (40 mg Intravenous Given 11/23/22 0201)  potassium chloride SA (KLOR-CON M) CR tablet 40 mEq (40 mEq Oral Given 11/23/22 0951)    Mobility walks with device     Focused Assessments Cardiac Assessment Handoff:  Cardiac Rhythm: Sinus tachycardia No results found for: "CKTOTAL", "CKMB", "CKMBINDEX", "TROPONINI" No results found for: "DDIMER" Does the Patient currently have chest pain? No   , Neuro Assessment Handoff:  Swallow screen pass? Yes  Cardiac Rhythm: Sinus tachycardia       Neuro Assessment: Within Defined Limits Neuro Checks:      Has TPA been given? No If patient is a Neuro Trauma and patient is going to OR before floor call report to [place] nurse: [phone] or [phone]   , Pulmonary Assessment Handoff:  O2 Device: Room Air      R Recommendations: See Admitting Provider Note  Report given to:   Additional Notes: Pt is here for AMS for 2 days, now is A0X3, Weakness is now his biggest complaint due to CHF exhasterbation, lungs are dim, on RA, Bilat lower edema, ACHS, prefers sitting in chair, male cath placed, family updated

## 2022-11-24 ENCOUNTER — Inpatient Hospital Stay (HOSPITAL_COMMUNITY): Payer: Medicare Other

## 2022-11-24 DIAGNOSIS — R579 Shock, unspecified: Secondary | ICD-10-CM

## 2022-11-24 DIAGNOSIS — Z515 Encounter for palliative care: Secondary | ICD-10-CM | POA: Diagnosis not present

## 2022-11-24 DIAGNOSIS — Z7189 Other specified counseling: Secondary | ICD-10-CM | POA: Diagnosis not present

## 2022-11-24 DIAGNOSIS — I071 Rheumatic tricuspid insufficiency: Secondary | ICD-10-CM | POA: Diagnosis not present

## 2022-11-24 DIAGNOSIS — I5043 Acute on chronic combined systolic (congestive) and diastolic (congestive) heart failure: Secondary | ICD-10-CM | POA: Diagnosis not present

## 2022-11-24 DIAGNOSIS — R627 Adult failure to thrive: Secondary | ICD-10-CM | POA: Diagnosis not present

## 2022-11-24 DIAGNOSIS — I739 Peripheral vascular disease, unspecified: Secondary | ICD-10-CM

## 2022-11-24 DIAGNOSIS — I5033 Acute on chronic diastolic (congestive) heart failure: Secondary | ICD-10-CM | POA: Diagnosis not present

## 2022-11-24 LAB — CBC WITH DIFFERENTIAL/PLATELET
Abs Immature Granulocytes: 0.01 10*3/uL (ref 0.00–0.07)
Basophils Absolute: 0 10*3/uL (ref 0.0–0.1)
Basophils Relative: 0 %
Eosinophils Absolute: 0.1 10*3/uL (ref 0.0–0.5)
Eosinophils Relative: 1 %
HCT: 27.6 % — ABNORMAL LOW (ref 39.0–52.0)
Hemoglobin: 10.2 g/dL — ABNORMAL LOW (ref 13.0–17.0)
Immature Granulocytes: 0 %
Lymphocytes Relative: 7 %
Lymphs Abs: 0.5 10*3/uL — ABNORMAL LOW (ref 0.7–4.0)
MCH: 32.3 pg (ref 26.0–34.0)
MCHC: 37 g/dL — ABNORMAL HIGH (ref 30.0–36.0)
MCV: 87.3 fL (ref 80.0–100.0)
Monocytes Absolute: 1 10*3/uL (ref 0.1–1.0)
Monocytes Relative: 15 %
Neutro Abs: 5.3 10*3/uL (ref 1.7–7.7)
Neutrophils Relative %: 77 %
Platelets: 223 10*3/uL (ref 150–400)
RBC: 3.16 MIL/uL — ABNORMAL LOW (ref 4.22–5.81)
RDW: 15.5 % (ref 11.5–15.5)
WBC: 7 10*3/uL (ref 4.0–10.5)
nRBC: 0.3 % — ABNORMAL HIGH (ref 0.0–0.2)

## 2022-11-24 LAB — LACTIC ACID, PLASMA
Lactic Acid, Venous: 2.7 mmol/L (ref 0.5–1.9)
Lactic Acid, Venous: 3.2 mmol/L (ref 0.5–1.9)

## 2022-11-24 LAB — BASIC METABOLIC PANEL
Anion gap: 19 — ABNORMAL HIGH (ref 5–15)
BUN: 82 mg/dL — ABNORMAL HIGH (ref 8–23)
CO2: 15 mmol/L — ABNORMAL LOW (ref 22–32)
Calcium: 8.2 mg/dL — ABNORMAL LOW (ref 8.9–10.3)
Chloride: 90 mmol/L — ABNORMAL LOW (ref 98–111)
Creatinine, Ser: 2.22 mg/dL — ABNORMAL HIGH (ref 0.61–1.24)
GFR, Estimated: 28 mL/min — ABNORMAL LOW (ref >60)
Glucose, Bld: 65 mg/dL — ABNORMAL LOW (ref 70–99)
Potassium: 3.7 mmol/L (ref 3.5–5.1)
Sodium: 124 mmol/L — ABNORMAL LOW (ref 135–145)

## 2022-11-24 LAB — GLUCOSE, CAPILLARY
Glucose-Capillary: 63 mg/dL — ABNORMAL LOW (ref 70–99)
Glucose-Capillary: 152 mg/dL — ABNORMAL HIGH (ref 70–99)
Glucose-Capillary: 81 mg/dL (ref 70–99)
Glucose-Capillary: 76 mg/dL (ref 70–99)

## 2022-11-24 LAB — PROCALCITONIN: Procalcitonin: 0.5 ng/mL

## 2022-11-24 LAB — TSH: TSH: 0.74 u[IU]/mL (ref 0.350–4.500)

## 2022-11-24 LAB — MAGNESIUM: Magnesium: 2.1 mg/dL (ref 1.7–2.4)

## 2022-11-24 MED ORDER — POLYVINYL ALCOHOL 1.4 % OP SOLN: 1.0000 [drp] | Freq: Four times a day (QID) | OPHTHALMIC | Status: DC | PRN

## 2022-11-24 MED ORDER — DEXTROSE 50 % IV SOLN
INTRAVENOUS | Status: AC
  Filled 2022-11-24: qty 50

## 2022-11-24 MED ORDER — MORPHINE BOLUS VIA INFUSION: 1.0000 mg | INTRAVENOUS | Status: DC | PRN

## 2022-11-24 MED ORDER — DEXTROSE 50 % IV SOLN: 1.0000 | INTRAVENOUS | Status: DC | PRN

## 2022-11-24 MED ORDER — BISACODYL 10 MG RE SUPP: 10.0000 mg | Freq: Every day | RECTAL | Status: DC | PRN

## 2022-11-24 MED ORDER — LORAZEPAM 1 MG PO TABS: 1.0000 mg | ORAL_TABLET | ORAL | Status: DC | PRN

## 2022-11-24 MED ORDER — LORAZEPAM 2 MG/ML IJ SOLN
1.0000 mg | INTRAMUSCULAR | Status: DC | PRN
  Administered 2022-11-25 (×2): 1 mg via INTRAVENOUS
  Filled 2022-11-24 (×3): qty 1

## 2022-11-24 MED ORDER — SENNOSIDES-DOCUSATE SODIUM 8.6-50 MG PO TABS: 1.0000 | ORAL_TABLET | Freq: Two times a day (BID) | ORAL | Status: DC | PRN

## 2022-11-24 MED ORDER — MORPHINE 100MG IN NS 100ML (1MG/ML) PREMIX INFUSION
1.0000 mg/h | INTRAVENOUS | Status: DC
  Administered 2022-11-24: 1 mg/h via INTRAVENOUS
  Filled 2022-11-24: qty 100

## 2022-11-24 MED ORDER — BIOTENE DRY MOUTH MT LIQD: 15.0000 mL | OROMUCOSAL | Status: DC | PRN

## 2022-11-24 MED ORDER — FUROSEMIDE 10 MG/ML IJ SOLN
120.0000 mg | Freq: Two times a day (BID) | INTRAVENOUS | Status: DC
  Filled 2022-11-24 (×3): qty 12

## 2022-11-24 MED ORDER — PROTAMINE SULFATE 10 MG/ML IV SOLN
25.0000 mg | Freq: Once | INTRAVENOUS | Status: AC
  Administered 2022-11-24: 25 mg via INTRAVENOUS
  Filled 2022-11-24: qty 2.5

## 2022-11-24 MED ORDER — PROTAMINE SULFATE 10 MG/ML IV SOLN: 50.0000 mg | Freq: Once | INTRAVENOUS | Status: DC

## 2022-11-24 MED ORDER — LORAZEPAM 2 MG/ML PO CONC: 1.0000 mg | ORAL | Status: DC | PRN

## 2022-11-24 MED ORDER — GLYCOPYRROLATE 1 MG PO TABS: 2.0000 mg | ORAL_TABLET | ORAL | Status: DC | PRN

## 2022-11-24 MED ORDER — GLYCOPYRROLATE 0.2 MG/ML IJ SOLN: 0.4000 mg | INTRAMUSCULAR | Status: DC | PRN

## 2022-11-24 NOTE — Progress Notes (Addendum)
Rounding Note    Patient Name: [name] Date of Encounter: 11/24/2022  [facility] Cardiologist: [name], MD   Subjective   Noted to be hypothermic overnight (94.9) and hypotensive down to SBP 70s.  Incomplete I/os this was in the ED most of the day but only 400 cc UOP recorded.  Labs this morning notable for creatinine 2.2 (from 2.1 yesterday), sodium 124, bicarb 15, lactate 3.2 > 2.7, procalcitonin 0.5.  Had meeting with palliative care yesterday, decided on no escalation of care, home would not want ICU admission.  He remains oriented x 3.  Denies any chest pain or dyspnea.  Inpatient Medications    Scheduled Meds:  aspirin EC  81 mg Oral Daily   dapagliflozin propanediol  10 mg Oral Daily   enzalutamide  160 mg Oral Daily   feeding supplement  237 mL Oral BID BM   furosemide  80 mg Intravenous Q12H   gabapentin  100 mg Oral QHS   insulin aspart  0-15 Units Subcutaneous TID WC   insulin aspart  0-5 Units Subcutaneous QHS   pantoprazole  40 mg Oral BID   rosuvastatin  5 mg Oral Daily   sodium chloride flush  3 mL Intravenous Q12H   Continuous Infusions:  sodium chloride     PRN Meds: sodium chloride, acetaminophen, dextrose, morphine injection, ondansetron (ZOFRAN) IV, sodium chloride flush   Vital Signs    Vitals:   11/24/22 0430 11/24/22 0500 11/24/22 0600 11/24/22 0750  BP:    (!) 79/63  Pulse: (!) 105   (!) 109  Resp:    17  Temp:   (!) 95.8 F (35.4 C) (!) 96.6 F (35.9 C)  TempSrc:    Axillary  SpO2: 95%   100%  Weight:  71.9 kg      Intake/Output Summary (Last 24 hours) at 11/24/2022 0855 Last data filed at 11/24/2022 0300 Gross per 24 hour  Intake 370 ml  Output 400 ml  Net -30 ml      11/24/2022    5:00 AM 11/06/2022    8:29 AM 10/21/2022    4:53 AM  Last 3 Weights  Weight (lbs) 158 lb 8.2 oz 157 lb 146 lb 9.7 oz  Weight (kg) 71.9 kg 71.215 kg 66.5 kg      Telemetry    Afib 110-120s - Personally  Reviewed  ECG    No new EKG - Personally Reviewed  Physical Exam   GEN: Appears ill  Neck: + JVD Cardiac: Tachycardic, irregular, Genesis systolic murmur Respiratory: Basilar crackles GI: Soft, nontender MS: Trace edema Neuro:  Nonfocal  Psych: Normal affect   Labs    High Sensitivity Troponin:  No results for input(s): "TROPONINIHS" in the last 720 hours.   Chemistry Recent Labs  Lab 11/23/2022 1459 11/23/22 0211 11/24/22 0155  NA 126* 127* 124*  K 3.5 3.5 3.7  CL 92* 92* 90*  CO2 15* 17* 15*  GLUCOSE 100* 58* 65*  BUN 72* 75* 82*  CREATININE 2.02* 2.10* 2.22*  CALCIUM 8.4* 8.5* 8.2*  MG  --  2.0 2.1  PROT 6.2*  --   --   ALBUMIN 2.8*  --   --   AST 47*  --   --   ALT 14  --   --   ALKPHOS 123  --   --   BILITOT 3.0*  --   --   GFRNONAA 31* 30* 28*  ANIONGAP 19* 18* 19*  Lipids No results for input(s): "CHOL", "TRIG", "HDL", "LABVLDL", "LDLCALC", "CHOLHDL" in the last 168 hours.  Hematology Recent Labs  Lab 11/09/2022 1459 11/24/22 0155  WBC 8.2 7.0  RBC 3.26* 3.16*  HGB 10.5* 10.2*  HCT 28.9* 27.6*  MCV 88.7 87.3  MCH 32.2 32.3  MCHC 36.3* 37.0*  RDW 15.9* 15.5  PLT 215 223   Thyroid  Recent Labs  Lab 11/24/22 0501  TSH 0.740    BNP Recent Labs  Lab 11/11/2022 1459  BNP 1,471.0*    DDimer No results for input(s): "DDIMER" in the last 168 hours.   Radiology    ECHOCARDIOGRAM COMPLETE  Result Date: 11/23/2022    ECHOCARDIOGRAM REPORT   Patient Name:   [name] Date of Exam: 11/23/2022 Medical Rec #:  [id number]         Height:       68.5 in Accession #:    [id number]        Weight:       157.0 lb Date of Birth:  1933/07/10          BSA:          1.854 m Patient Age:    61 years          BP:           96/65 mmHg Patient Gender: M                 HR:           106 bpm. Exam Location:  Inpatient Procedure: 2D Echo, Cardiac Doppler, Color Doppler and Intracardiac            Opacification Agent STAT ECHO Indications:    CHF  History:         Patient has prior history of Echocardiogram examinations, most                 recent 10/19/2022. Arrythmias:Atrial Fibrillation,                 Signs/Symptoms:Dyspnea; Risk Factors:Diabetes and Dyslipidemia.                 CKD. 26 mm Sapien bioprosthetic valve is present                 in the aortic position. Procedure date: 01/19/13.                 Aortic Valve: 26 mm Edwards Sapien prosthetic, stented (TAVR)                 valve is present in the aortic position.  Sonographer:    Clayton Lefort RDCS (AE) Referring Phys: Lily Kocher  Sonographer Comments: Technically difficult study due to poor echo windows. IMPRESSIONS  1. Left ventricular ejection fraction, by estimation, is 35 to 40%. The left ventricle has moderately decreased function. The left ventricle demonstrates regional wall motion abnormalities (see scoring diagram/findings for description). There is mild left ventricular hypertrophy. Left ventricular diastolic parameters are indeterminate. There is the interventricular septum is flattened in systole and diastole, consistent with right ventricular pressure and volume overload.  2. Right ventricular systolic function is moderately reduced. The right ventricular size is mildly enlarged. There is mildly elevated pulmonary artery systolic pressure. The estimated right ventricular systolic pressure is 29.5 mmHg.  3. Left atrial size was moderately dilated.  4. Right atrial size was severely dilated.  5. The mitral valve is degenerative. Moderate to severe mitral valve regurgitation.  No evidence of mitral stenosis. Moderate mitral annular calcification.  6. The tricuspid valve is abnormal. Tricuspid valve regurgitation is severe.  7. The aortic valve has been repaired/replaced. Aortic valve regurgitation is mild. There is a 26 mm Edwards Sapien prosthetic (TAVR) valve present in the aortic position. Aortic valve mean gradient measures 4.0 mmHg.  8. The inferior vena cava is dilated in size with <50%  respiratory variability, suggesting right atrial pressure of 15 mmHg. FINDINGS  Left Ventricle: Left ventricular ejection fraction, by estimation, is 35 to 40%. The left ventricle has moderately decreased function. The left ventricle demonstrates regional wall motion abnormalities. Definity contrast agent was given IV to delineate the left ventricular endocardial borders. The left ventricular internal cavity size was normal in size. There is mild left ventricular hypertrophy. The interventricular septum is flattened in systole and diastole, consistent with right ventricular pressure and volume overload. Left ventricular diastolic parameters are indeterminate.  LV Wall Scoring: The apex is akinetic. The inferior septum, entire inferior wall, and posterior wall are hypokinetic. The entire anterior wall, antero-lateral wall, anterior septum, and apical lateral segment are normal. Right Ventricle: The right ventricular size is mildly enlarged. Right vetricular wall thickness was not well visualized. Right ventricular systolic function is moderately reduced. There is mildly elevated pulmonary artery systolic pressure. The tricuspid  regurgitant velocity is 2.51 m/s, and with an assumed right atrial pressure of 15 mmHg, the estimated right ventricular systolic pressure is 40.1 mmHg. Left Atrium: Left atrial size was moderately dilated. Right Atrium: Right atrial size was severely dilated. Pericardium: There is no evidence of pericardial effusion. Mitral Valve: The mitral valve is degenerative in appearance. Moderate mitral annular calcification. Moderate to severe mitral valve regurgitation. No evidence of mitral valve stenosis. Tricuspid Valve: The tricuspid valve is abnormal. Tricuspid valve regurgitation is severe. Aortic Valve: The aortic valve has been repaired/replaced. Aortic valve regurgitation is mild. Aortic valve mean gradient measures 4.0 mmHg. Aortic valve peak gradient measures 8.0 mmHg. Aortic valve area,  by VTI measures 3.05 cm. There is a 26 mm Edwards Sapien prosthetic, stented (TAVR) valve present in the aortic position. Pulmonic Valve: The pulmonic valve was not well visualized. Pulmonic valve regurgitation is trivial. Aorta: The aortic root is normal in size and structure. Venous: The inferior vena cava is dilated in size with less than 50% respiratory variability, suggesting right atrial pressure of 15 mmHg. IAS/Shunts: The interatrial septum was not well visualized.  LEFT VENTRICLE PLAX 2D LVIDd:         4.90 cm LVIDs:         4.00 cm LV PW:         1.30 cm LV IVS:        1.40 cm LVOT diam:     2.60 cm LV SV:         63 LV SV Index:   34 LVOT Area:     5.31 cm  RIGHT VENTRICLE            IVC RV Basal diam:  3.40 cm    IVC diam: 2.50 cm RV S prime:     6.20 cm/s TAPSE (M-mode): 0.6 cm LEFT ATRIUM           Index        RIGHT ATRIUM           Index LA diam:      4.20 cm 2.27 cm/m   RA Area:     33.20 cm LA Vol (A4C): 87.0  ml 46.93 ml/m  RA Volume:   128.00 ml 69.04 ml/m  AORTIC VALVE AV Area (Vmax):    3.09 cm AV Area (Vmean):   2.99 cm AV Area (VTI):     3.05 cm AV Vmax:           141.00 cm/s AV Vmean:          94.100 cm/s AV VTI:            0.207 m AV Peak Grad:      8.0 mmHg AV Mean Grad:      4.0 mmHg LVOT Vmax:         82.10 cm/s LVOT Vmean:        53.000 cm/s LVOT VTI:          0.119 m LVOT/AV VTI ratio: 0.57  AORTA Ao Root diam: 3.30 cm MR Peak grad:    70.2 mmHg    TRICUSPID VALVE MR Mean grad:    42.0 mmHg    TR Peak grad:   25.2 mmHg MR Vmax:         419.00 cm/s  TR Vmax:        251.00 cm/s MR Vmean:        306.0 cm/s MR PISA:         3.08 cm     SHUNTS MR PISA Eff ROA: 29 mm       Systemic VTI:  0.12 m MR PISA Radius:  0.70 cm      Systemic Diam: 2.60 cm Oswaldo Milian MD Electronically signed by Oswaldo Milian MD Signature Date/Time: 11/23/2022/11:32:54 AM    Final    DG Chest 2 View  Result Date: 11/18/2022 CLINICAL DATA:  Possible sepsis. EXAM: CHEST - 2 VIEW COMPARISON:   Chest x-ray dated October 18, 2022. FINDINGS: Stable cardiomegaly status post TAVR. No focal consolidation, pleural effusion, or pneumothorax. No acute osseous abnormality. IMPRESSION: No active cardiopulmonary disease. Electronically Signed   By: Titus Dubin M.D.   On: 11/07/2022 15:37    Cardiac Studies     Patient Profile     89 y.o. male with a hx of severe AS s/p TAVR, severe TR, moderate MR, chronic HFrEF, permanent afib, PVCs, hypertension, hyperlipidemia, chronic anemia, PAD who is being seen 11/23/2022 for the evaluation of heart failure exacerbation   Assessment & Plan    Shock: He is hypotensive with elevated lactate and worsening AKI.  Procalcitonin mildly elevated and he has been hypothermic, may be septic component (though no clear source; d/w Dr Doristine Bosworth and does not think sepsis contributing) but suspect primarily cardiogenic shock.  EF 35 to 40% on echo yesterday but also with severe MR and I suspect his forward cardiac output is very low.  Goals of care discussion was had with palliative medicine yesterday, family elected for no escalation of care, including transfer to ICU.  He has severe MR/TR and has previously been evaluated by structural heart team and not a candidate for intervention given his age/comorbidities.  At this point I think he would need inotropic support, but his BP is unlikely to tolerate this without pressors, would require ICU transfer and central access.  This is against the family's wishes.  I discussed this with patient's daughter, she would like time to process everything, is planning on coming in to hospital this morning for further goals of care discussion with palliative.   Acute on chronic combined heart failure: Echo 11/23/2022 showed EF 35 to 40%, moderate RV dysfunction, severe  TR, moderate to severe MR.  -Poor response to IV Lasix 80 mg, will increase to 120 mg twice daily -Likely needs transfer to ICU for inotropic support as above, but would not  be a bridge to anything as patient has severe valvular disease which he is not a candidate for intervention.  Discussed with Advanced Heart Failure, in agreement with pursuing palliative care. Family does not want escalation of care, planning further goals of care discussion today   For questions or updates, please contact [place] Please consult [email or web address] for contact info under   CRITICAL CARE TIME: I have spent a total of 45 minutes with patient reviewing hospital notes, telemetry, EKGs, labs and examining the patient as well as establishing an assessment and plan that was discussed with the patient and his daughter.  > 50% of time was spent in direct patient care. The patient is critically ill with multi-organ system failure and requires high complexity decision making for assessment and support, frequent evaluation and titration of therapies, application of advanced monitoring technologies and extensive interpretation of multiple databases.       Signed, Donato Heinz, MD  11/24/2022, 8:55 AM

## 2022-11-24 NOTE — Progress Notes (Addendum)
Daily Progress Note   Patient Name: [name]       Date: 11/24/2022 DOB: 11/05/1932  Age: 89 y.o. MRN#: [id number] Attending Physician: Darliss Cheney, MD Primary Care Physician: [name], MD Admit Date: 11/04/2022  Reason for Consultation/Follow-up: Establishing goals of care  Subjective: Medical records reviewed and progress notes, labs, imaging. Patient assessed at the bedside.  Initially sitting on the edge of the bed and then returned to bed for x-ray imaging.  He is lethargic, shrugs his shoulders and nods his head yes and no to answer questions intermittently.  Discussed with RN.  His daughter Dondra Spry is present visiting.  Daughter and I stepped into the hall while x-ray was obtained.  She is very tearful after hearing today's update from cardiology.  Emotional support and therapeutic listening was provided.  She is also having a hard time as patient was very upset with her just prior to my arrival, telling her that she wanted him to die in the hospital despite knowing this is against his wish.  She states this is not normally his personality.  She would like to honor his wish to return home if possible, understanding that he needs hospice philosophy and is approaching end-of-life.  Hospice in the outpatient setting was reviewed, explaining the difference between services provided in the home setting and in a hospice facility.  She has prior experience with [facility] and is very disheartened to hear that they are closed for renovations until early February.  She would not want him to be in another city such as [place] or [place] for the next available options.  Given that hospice support in the home is not 24/7 and she would be solely responsible for providing all of  patient's care needs, she is worried about whether taking him home with hospice is even feasible.  We discussed that private caregivers are typically an out-of-pocket expense if limited hospice support is not a safe option.  We then returned to the bedside and reviewed his options with the patient.  He shrugs his shoulders in response to a hospice facility for additional support and end-of-life care.  He worries about his dog and I provided reassurance that pets are always allowed for visitation in hospice facility.  I described the typical resources and equipment that would be provided  in the home setting.  He clearly nods his head "yes" when his daughter asked if he prefers to go home.  Given he is eager to go home as soon as possible, I reviewed the option of transitioning to comfort focused care in detail and suggested we can begin making arrangements for a hospice referral.  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  Questions and concerns addressed. PMT will continue to support holistically.   Addendum: Received update from RN that patient was in severe distress, wanting to die.  Primary attending then had conversation with daughter and she voiced readiness for comfort care.  I called patient's daughter to confirm and she again stated readiness for comfort focused care.  Offered to come to speak with the patient and review further, however she does not think he is in a state of further conversations.  The option of starting a low-dose morphine infusion was discussed with Sherrie.  She is agreeable after discussing her concerns regarding whether patient could remain alert.  Provided education on the option of boluses and titrating drip further as  needed.  Length of Stay: 2  Physical Exam Vitals and nursing note reviewed.  Constitutional:      General: He is not in acute distress.    Appearance: He is ill-appearing.  Cardiovascular:     Rate and Rhythm: Tachycardia present.  Pulmonary:     Effort: Pulmonary effort is normal. No respiratory distress.  Neurological:     Mental Status: He is lethargic.  Psychiatric:        Mood and Affect: Affect is flat.   Vital Signs: BP (!) 102/55   Pulse (!) 109   Temp 97.6 F (36.4 C) (Axillary)   Resp 17   Wt 71.9 kg   SpO2 100%   BMI 23.75 kg/m  SpO2: SpO2: 100 % O2 Device: O2 Device: Room Air O2 Flow Rate:        Palliative Assessment/Data:   Palliative Care Assessment & Plan   Patient Profile: 89 y.o. male  with past medical history of aortic stenosis, atrial fibrillation, hypertension, hyperlipidemia, h/o AVR [bioprosthetic valve], diabetes mellitus, prostate cancer admitted on 11/18/2022 with poor appetite, weakness, dyspnea.    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  PMT has been consulted to assist with goals of care conversation.   Assessment: Goals of care conversation Cardiogenic shock Acute on chronic CHF Severe MR/TR Acute AKI on chronic CKD3b Lactic acidosis  Recommendations/Plan: Continue DNR Continue current limited medical interventions in place without further escalation if he continues to decline Daughter continues to process patient's poor prognosis and consider options including comfort care and hospice, will continue to support through difficult decision making Ongoing goals of care discussions Psychosocial and emotional support provided PMT will continue to follow and support  Addendum: Orders for transition to comfort care were placed including morphine drip as stated above.  Prognosis: Very poor  Discharge Planning: To Be Determined  Care plan was discussed with  patient, patient's daughter, Dr. Doristine Bosworth   MDM high Prolonged/additional time: 47 minutes        [place], [facility]  Palliative Medicine Team Team phone # [phone]  Thank  you for allowing the Palliative Medicine Team to assist in the care of this patient. Please utilize secure chat with additional questions, if there is no response within 30 minutes please call the above phone number.  Palliative Medicine Team providers are available by phone from 7am to 7pm daily and can be reached through the team cell phone.  Should this patient require assistance outside of these hours, please call the patient's attending physician.

## 2022-11-24 NOTE — Progress Notes (Signed)
PROGRESS NOTE    [name]  JKD:[id number] DOB: Feb 18, 1933 DOA: 11/08/2022 PCP: [name], MD   Brief Narrative:  HPI: This is a 89 year old male with past medical history of aortic stenosis, atrial fibrillation, hypertension, hyperlipidemia, h/o AVR [bioprosthetic valve], diabetes mellitus, prostate cancer.  He is a nursing home recipient.  He was sent in with symptoms of decreased appetite, increasing weakness, shortness of breath.  Patient does not provide much history as he is quite annoyed at being in the hospital.  Assessment & Plan:   Principal Problem:   Acute on chronic heart failure with preserved ejection fraction (HFpEF) (HCC) Active Problems:   Unspecified protein-calorie malnutrition ([place])   Hyperlipemia   H/O prostate cancer   Anticoagulated on Coumadin   S/P aortic valve replacement with bioprosthetic valve   PAD (peripheral artery disease) (HCC)   Severe mitral regurgitation   Diabetic neuropathy (HCC)   Recurrent falls   Essential hypertension   Coagulopathy (HCC)   Physical deconditioning   Protein-calorie malnutrition, severe   Persistent atrial fibrillation (HCC)   Pressure injury of sacral region, stage 2 (HCC)   Pulmonary hypertension (HCC)   Severe tricuspid regurgitation   Atrial fibrillation, chronic (HCC)   Acute on chronic congestive heart failure (HCC)   Shock (HCC)  Acute on chronic heart failure with preserved ejection fraction (HFpEF) (HCC)/cardiogenic shock:  RV failure/severe mitral regurgitation/severe tricuspid regurgitation.  Events from last night noted, patient has remained hypothermic, has lactic acidosis and he is hypotensive.  Clearly he is in cardiogenic shock.  Procalcitonin mildly elevated but no fever, no other indications of infection, procalcitonin could be elevated in the setting of CKD.  Ideally patient needs inotropic support with vasopressors and transferred to ICU however this is likely his family's wishes and  cardiologist has personally discussed this with patient's daughter and she does not want escalation of the care but she is requesting some time to process this.  Palliative care is also following and has met with the daughter who is not ready for transitioning to full comfort care but does not want escalation of the care either.  This makes it very challenging situation.  We will try to manage this patient medically on the telemetry floor as much as we can.  Cardiology has increased his Lasix to 120 mg IV twice daily.  We will repeat his lactic acid in few hours.    Acute on chronic kidney disease, stage IIIb: Baseline creatinine appears to be around 1.6, presented with 2.10.  Which is slightly worse today, likely cardiorenal.  Lasix being managed by cardiology and they have increased it.    Hyponatremia/hypochloremia/hypocalcemia -Likely secondary fluid overload.  Fluid restricted.  Sodium and calcium both down today.    Lactic acidosis -Doubt infection, repeat labs today.    Hyperlipemia Continue Crestor    Essential hypertension -Borderline blood pressures.   Monitor with diuresis.  Details as above.    Persistent atrial fibrillation (HCC) -Not on anticoagulation due to chronic anemia -Per chart review, not a candidate for Watchman device.  Rates fluctuating, he is not on any rate control medications.    Diabetes mellitus 2/peripheral neuropathy -Sliding scale insulin initiated Continue gabapentin    Physical deconditioning -PT/OT consult    Pressure injury of sacral region, stage 2 (HCC) -Wound care consult    Unspecified protein calorie malnutrition -Nutrition consult, Ensure with meals    Iron deficiency anemia due to chronic blood loss -Hemoglobin stable -Follows with oncology, on IV Venfer  per oncology -p.o. iron resumed    Failure to thrive -Long discussion re goals of care, done last admission no change in plans at this time.  Poor prognosis, daughter is aware,  however she is not emotionally ready to transition him to comfort care.  Palliative care following.  DVT prophylaxis:    Code Status: DNR  Family Communication:  None present at bedside.  Plan of care discussed with patient in length and he/she verbalized understanding and agreed with it.  Status is: Inpatient Remains inpatient appropriate because: She is very sick.   Estimated body mass index is 23.75 kg/m as calculated from the following:   Height as of 11/06/22: 5' 8.5" (1.74 m).   Weight as of this encounter: 71.9 kg.  Pressure Injury 10/02/21 Coccyx Medial Stage 3 -  Full thickness tissue loss. Subcutaneous fat may be visible but bone, tendon or muscle are NOT exposed. (Active)  10/02/21 1425  Location: Coccyx  Location Orientation: Medial  Staging: Stage 3 -  Full thickness tissue loss. Subcutaneous fat may be visible but bone, tendon or muscle are NOT exposed.  Wound Description (Comments):   Present on Admission: Yes     Pressure Injury 03/01/22 Coccyx Medial Stage 2 -  Partial thickness loss of dermis presenting as a shallow open injury with a red, pink wound bed without slough. (Active)  03/01/22 1600  Location: Coccyx  Location Orientation: Medial  Staging: Stage 2 -  Partial thickness loss of dermis presenting as a shallow open injury with a red, pink wound bed without slough.  Wound Description (Comments):   Present on Admission: Yes     Pressure Injury 10/19/22 Buttocks Left Stage 2 -  Partial thickness loss of dermis presenting as a shallow open injury with a red, pink wound bed without slough. pink, shallow (Active)  10/19/22 1800  Location: Buttocks  Location Orientation: Left  Staging: Stage 2 -  Partial thickness loss of dermis presenting as a shallow open injury with a red, pink wound bed without slough.  Wound Description (Comments): pink, shallow  Present on Admission: Yes     Pressure Injury 11/23/22 Coccyx Mid;Lower Stage 2 -  Partial thickness loss of  dermis presenting as a shallow open injury with a red, pink wound bed without slough.  (Active)  11/23/22 1535  Location: Coccyx  Location Orientation: Mid;Lower  Staging: Stage 2 -  Partial thickness loss of dermis presenting as a shallow open injury with a red, pink wound bed without slough.  Wound Description (Comments): -- (Coccyx Stage 2 Pressue Injury)  Present on Admission:   Dressing Type Foam - Lift dressing to assess site every shift 11/24/22 0500   Nutritional Assessment: Body mass index is 23.75 kg/m.Marland Kitchen Seen by dietician.  I agree with the assessment and plan as outlined below: Nutrition Status:        . Skin Assessment: I have examined the patient's skin and I agree with the wound assessment as performed by the wound care RN as outlined below: Pressure Injury 10/02/21 Coccyx Medial Stage 3 -  Full thickness tissue loss. Subcutaneous fat may be visible but bone, tendon or muscle are NOT exposed. (Active)  10/02/21 1425  Location: Coccyx  Location Orientation: Medial  Staging: Stage 3 -  Full thickness tissue loss. Subcutaneous fat may be visible but bone, tendon or muscle are NOT exposed.  Wound Description (Comments):   Present on Admission: Yes     Pressure Injury 03/01/22 Coccyx Medial Stage 2 -  Partial thickness loss of dermis presenting as a shallow open injury with a red, pink wound bed without slough. (Active)  03/01/22 1600  Location: Coccyx  Location Orientation: Medial  Staging: Stage 2 -  Partial thickness loss of dermis presenting as a shallow open injury with a red, pink wound bed without slough.  Wound Description (Comments):   Present on Admission: Yes     Pressure Injury 10/19/22 Buttocks Left Stage 2 -  Partial thickness loss of dermis presenting as a shallow open injury with a red, pink wound bed without slough. pink, shallow (Active)  10/19/22 1800  Location: Buttocks  Location Orientation: Left  Staging: Stage 2 -  Partial thickness loss of  dermis presenting as a shallow open injury with a red, pink wound bed without slough.  Wound Description (Comments): pink, shallow  Present on Admission: Yes     Pressure Injury 11/23/22 Coccyx Mid;Lower Stage 2 -  Partial thickness loss of dermis presenting as a shallow open injury with a red, pink wound bed without slough.  (Active)  11/23/22 1535  Location: Coccyx  Location Orientation: Mid;Lower  Staging: Stage 2 -  Partial thickness loss of dermis presenting as a shallow open injury with a red, pink wound bed without slough.  Wound Description (Comments): -- (Coccyx Stage 2 Pressue Injury)  Present on Admission:   Dressing Type Foam - Lift dressing to assess site every shift 11/24/22 0500    Consultants:  Neurology and palliative care  Procedures:  None  Antimicrobials:  Anti-infectives (From admission, onward)    None         Subjective:  Patient seen and examined.  The only thing he kept repeating himself this morning was that he wanted more water.  He did not have any shortness of breath.  He was slightly lethargic.  Objective: Vitals:   11/24/22 0804 11/24/22 0949 11/24/22 1004 11/24/22 1143  BP: (!) 104/54 (!) 90/54 (!) 102/55 (!) 89/51  Pulse:    (!) 113  Resp:    20  Temp: (!) 97 F (36.1 C) 97.6 F (36.4 C)  (!) 97.1 F (36.2 C)  TempSrc: Axillary Axillary  Oral  SpO2: 100% 100%    Weight:        Intake/Output Summary (Last 24 hours) at 11/24/2022 1255 Last data filed at 11/24/2022 0300 Gross per 24 hour  Intake 360 ml  Output 400 ml  Net -40 ml    Filed Weights   11/24/22 0500  Weight: 71.9 kg    Examination:  General exam: Appears lethargic and weak Respiratory system: Clear to auscultation. Respiratory effort normal. Cardiovascular system: S1 & S2 heard, RRR. No JVD, murmurs, rubs, gallops or clicks. No pedal edema. Gastrointestinal system: Abdomen is nondistended, soft and nontender. No organomegaly or masses felt. Normal bowel sounds  heard. Central nervous system: Lethargic, unable to assess orientation.  No focal deficit. Data Reviewed: I have personally reviewed following labs and imaging studies  CBC: Recent Labs  Lab 11/18/2022 1459 11/24/22 0155  WBC 8.2 7.0  NEUTROABS 6.7 5.3  HGB 10.5* 10.2*  HCT 28.9* 27.6*  MCV 88.7 87.3  PLT 215 993    Basic Metabolic Panel: Recent Labs  Lab 11/13/2022 1459 11/23/22 0211 11/24/22 0155  NA 126* 127* 124*  K 3.5 3.5 3.7  CL 92* 92* 90*  CO2 15* 17* 15*  GLUCOSE 100* 58* 65*  BUN 72* 75* 82*  CREATININE 2.02* 2.10* 2.22*  CALCIUM 8.4* 8.5* 8.2*  MG  --  2.0 2.1    GFR: Estimated Creatinine Clearance: 23.1 mL/min (A) (by C-G formula based on SCr of 2.22 mg/dL (H)). Liver Function Tests: Recent Labs  Lab 11/29/2022 1459  AST 47*  ALT 14  ALKPHOS 123  BILITOT 3.0*  PROT 6.2*  ALBUMIN 2.8*    No results for input(s): "LIPASE", "AMYLASE" in the last 168 hours. No results for input(s): "AMMONIA" in the last 168 hours. Coagulation Profile: Recent Labs  Lab 11/08/2022 1459  INR 2.5*    Cardiac Enzymes: No results for input(s): "CKTOTAL", "CKMB", "CKMBINDEX", "TROPONINI" in the last 168 hours. BNP (last 3 results) Recent Labs    12/05/21 0915  PROBNP 6,343*    HbA1C: Recent Labs    11/23/22 0211  HGBA1C 7.3*    CBG: Recent Labs  Lab 11/23/22 2058 11/24/22 0319 11/24/22 0401 11/24/22 0755 11/24/22 1207  GLUCAP 86 63* 152* 81 76    Lipid Profile: No results for input(s): "CHOL", "HDL", "LDLCALC", "TRIG", "CHOLHDL", "LDLDIRECT" in the last 72 hours. Thyroid Function Tests: Recent Labs    11/24/22 0501  TSH 0.740   Anemia Panel: No results for input(s): "VITAMINB12", "FOLATE", "FERRITIN", "TIBC", "IRON", "RETICCTPCT" in the last 72 hours. Sepsis Labs: Recent Labs  Lab 11/28/2022 1948 11/23/22 1437 11/24/22 0155 11/24/22 0501 11/24/22 1041  PROCALCITON  --   --   --  0.50  --   LATICACIDVEN 2.6* 3.2* 2.7*  --  3.2*      Recent Results (from the past 240 hour(s))  Culture, blood (Routine x 2)     Status: None (Preliminary result)   Collection Time: 11/12/2022  2:59 PM   Specimen: BLOOD RIGHT ARM  Result Value Ref Range Status   Specimen Description BLOOD RIGHT ARM  Final   Special Requests   Final    BOTTLES DRAWN AEROBIC AND ANAEROBIC Blood Culture results may not be optimal due to an inadequate volume of blood received in culture bottles   Culture   Final    NO GROWTH 2 DAYS Performed at [facility], [place] [address], [place], [place] [address]    Report Status PENDING  Incomplete  Culture, blood (Routine x 2)     Status: None (Preliminary result)   Collection Time: 11/19/2022  6:21 PM   Specimen: BLOOD  Result Value Ref Range Status   Specimen Description BLOOD RIGHT ANTECUBITAL  Final   Special Requests   Final    BOTTLES DRAWN AEROBIC AND ANAEROBIC Blood Culture adequate volume   Culture   Final    NO GROWTH 2 DAYS Performed at [facility], [place] [address] [place],  [address]    Report Status PENDING  Incomplete     Radiology Studies: ECHOCARDIOGRAM COMPLETE  Result Date: 11/23/2022    ECHOCARDIOGRAM REPORT   Patient Name:   THAI BURGUENO Date of Exam: 11/23/2022 Medical Rec #:  [id number]         Height:       68.5 in Accession #:    [id number]        Weight:       157.0 lb Date of Birth:  25-Mar-1933          BSA:          1.854 m Patient Age:    47 years          BP:           96/65 mmHg Patient Gender: M  HR:           106 bpm. Exam Location:  Inpatient Procedure: 2D Echo, Cardiac Doppler, Color Doppler and Intracardiac            Opacification Agent STAT ECHO Indications:    CHF  History:        Patient has prior history of Echocardiogram examinations, most                 recent 10/19/2022. Arrythmias:Atrial Fibrillation,                 Signs/Symptoms:Dyspnea; Risk Factors:Diabetes and Dyslipidemia.                 CKD. 26 mm Sapien bioprosthetic valve  is present                 in the aortic position. Procedure date: 01/19/13.                 Aortic Valve: 26 mm Edwards Sapien prosthetic, stented (TAVR)                 valve is present in the aortic position.  Sonographer:    Clayton Lefort RDCS (AE) Referring Phys: Lily Kocher  Sonographer Comments: Technically difficult study due to poor echo windows. IMPRESSIONS  1. Left ventricular ejection fraction, by estimation, is 35 to 40%. The left ventricle has moderately decreased function. The left ventricle demonstrates regional wall motion abnormalities (see scoring diagram/findings for description). There is mild left ventricular hypertrophy. Left ventricular diastolic parameters are indeterminate. There is the interventricular septum is flattened in systole and diastole, consistent with right ventricular pressure and volume overload.  2. Right ventricular systolic function is moderately reduced. The right ventricular size is mildly enlarged. There is mildly elevated pulmonary artery systolic pressure. The estimated right ventricular systolic pressure is 09.4 mmHg.  3. Left atrial size was moderately dilated.  4. Right atrial size was severely dilated.  5. The mitral valve is degenerative. Moderate to severe mitral valve regurgitation. No evidence of mitral stenosis. Moderate mitral annular calcification.  6. The tricuspid valve is abnormal. Tricuspid valve regurgitation is severe.  7. The aortic valve has been repaired/replaced. Aortic valve regurgitation is mild. There is a 26 mm Edwards Sapien prosthetic (TAVR) valve present in the aortic position. Aortic valve mean gradient measures 4.0 mmHg.  8. The inferior vena cava is dilated in size with <50% respiratory variability, suggesting right atrial pressure of 15 mmHg. FINDINGS  Left Ventricle: Left ventricular ejection fraction, by estimation, is 35 to 40%. The left ventricle has moderately decreased function. The left ventricle demonstrates regional wall motion  abnormalities. Definity contrast agent was given IV to delineate the left ventricular endocardial borders. The left ventricular internal cavity size was normal in size. There is mild left ventricular hypertrophy. The interventricular septum is flattened in systole and diastole, consistent with right ventricular pressure and volume overload. Left ventricular diastolic parameters are indeterminate.  LV Wall Scoring: The apex is akinetic. The inferior septum, entire inferior wall, and posterior wall are hypokinetic. The entire anterior wall, antero-lateral wall, anterior septum, and apical lateral segment are normal. Right Ventricle: The right ventricular size is mildly enlarged. Right vetricular wall thickness was not well visualized. Right ventricular systolic function is moderately reduced. There is mildly elevated pulmonary artery systolic pressure. The tricuspid  regurgitant velocity is 2.51 m/s, and with an assumed right atrial pressure of 15 mmHg, the estimated right ventricular systolic pressure is 40.2  mmHg. Left Atrium: Left atrial size was moderately dilated. Right Atrium: Right atrial size was severely dilated. Pericardium: There is no evidence of pericardial effusion. Mitral Valve: The mitral valve is degenerative in appearance. Moderate mitral annular calcification. Moderate to severe mitral valve regurgitation. No evidence of mitral valve stenosis. Tricuspid Valve: The tricuspid valve is abnormal. Tricuspid valve regurgitation is severe. Aortic Valve: The aortic valve has been repaired/replaced. Aortic valve regurgitation is mild. Aortic valve mean gradient measures 4.0 mmHg. Aortic valve peak gradient measures 8.0 mmHg. Aortic valve area, by VTI measures 3.05 cm. There is a 26 mm Edwards Sapien prosthetic, stented (TAVR) valve present in the aortic position. Pulmonic Valve: The pulmonic valve was not well visualized. Pulmonic valve regurgitation is trivial. Aorta: The aortic root is normal in size and  structure. Venous: The inferior vena cava is dilated in size with less than 50% respiratory variability, suggesting right atrial pressure of 15 mmHg. IAS/Shunts: The interatrial septum was not well visualized.  LEFT VENTRICLE PLAX 2D LVIDd:         4.90 cm LVIDs:         4.00 cm LV PW:         1.30 cm LV IVS:        1.40 cm LVOT diam:     2.60 cm LV SV:         63 LV SV Index:   34 LVOT Area:     5.31 cm  RIGHT VENTRICLE            IVC RV Basal diam:  3.40 cm    IVC diam: 2.50 cm RV S prime:     6.20 cm/s TAPSE (M-mode): 0.6 cm LEFT ATRIUM           Index        RIGHT ATRIUM           Index LA diam:      4.20 cm 2.27 cm/m   RA Area:     33.20 cm LA Vol (A4C): 87.0 ml 46.93 ml/m  RA Volume:   128.00 ml 69.04 ml/m  AORTIC VALVE AV Area (Vmax):    3.09 cm AV Area (Vmean):   2.99 cm AV Area (VTI):     3.05 cm AV Vmax:           141.00 cm/s AV Vmean:          94.100 cm/s AV VTI:            0.207 m AV Peak Grad:      8.0 mmHg AV Mean Grad:      4.0 mmHg LVOT Vmax:         82.10 cm/s LVOT Vmean:        53.000 cm/s LVOT VTI:          0.119 m LVOT/AV VTI ratio: 0.57  AORTA Ao Root diam: 3.30 cm MR Peak grad:    70.2 mmHg    TRICUSPID VALVE MR Mean grad:    42.0 mmHg    TR Peak grad:   25.2 mmHg MR Vmax:         419.00 cm/s  TR Vmax:        251.00 cm/s MR Vmean:        306.0 cm/s MR PISA:         3.08 cm     SHUNTS MR PISA Eff ROA: 29 mm       Systemic VTI:  0.12 m MR PISA  Radius:  0.70 cm      Systemic Diam: 2.60 cm Oswaldo Milian MD Electronically signed by Oswaldo Milian MD Signature Date/Time: 11/23/2022/11:32:54 AM    Final    DG Chest 2 View  Result Date: 11/09/2022 CLINICAL DATA:  Possible sepsis. EXAM: CHEST - 2 VIEW COMPARISON:  Chest x-ray dated October 18, 2022. FINDINGS: Stable cardiomegaly status post TAVR. No focal consolidation, pleural effusion, or pneumothorax. No acute osseous abnormality. IMPRESSION: No active cardiopulmonary disease. Electronically Signed   By: Titus Dubin  M.D.   On: 11/06/2022 15:37    Scheduled Meds:  aspirin EC  81 mg Oral Daily   dapagliflozin propanediol  10 mg Oral Daily   enzalutamide  160 mg Oral Daily   feeding supplement  237 mL Oral BID BM   gabapentin  100 mg Oral QHS   insulin aspart  0-15 Units Subcutaneous TID WC   insulin aspart  0-5 Units Subcutaneous QHS   pantoprazole  40 mg Oral BID   rosuvastatin  5 mg Oral Daily   sodium chloride flush  3 mL Intravenous Q12H   Continuous Infusions:  sodium chloride     furosemide       LOS: 2 days   Darliss Cheney, MD [facility]  11/24/2022, 12:55 PM   *Please note that this is a verbal dictation therefore any spelling or grammatical errors are due to the "[place] [employer]" system interpretation.  Please page via [place] and do not message via secure chat for urgent patient care matters. Secure chat can be used for non urgent patient care matters.  How to contact the [facility] Attending or Consulting provider [place] or covering provider during after hours [place], for this patient?  Check the care team in [facility] and look for a) attending/consulting [facility] provider listed and b) the [facility] team listed. Page or secure chat [phone]. Log into [email or web address] and use [place]'s universal password to access. If you do not have the password, please contact the hospital operator. Locate the [facility] provider you are looking for under [facility] and page to a number that you can be directly reached. If you still have difficulty reaching the provider, please page the [facility] (Director on Call) for the Hospitalists listed on amion for assistance.

## 2022-11-24 NOTE — Progress Notes (Signed)
   11/24/22 0215  Assess: MEWS Score  Temp (!) 94.5 F (34.7 C)  BP (!) 99/58  MAP (mmHg) 72  Pulse Rate (!) 40  ECG Heart Rate (!) 108  Resp 18  Level of Consciousness Alert  SpO2 100 %  O2 Device Room Air  Assess: MEWS Score  MEWS Temp 2  MEWS Systolic 1  MEWS Pulse 1  MEWS RR 0  MEWS LOC 0  MEWS Score 4  MEWS Score Color Red  Assess: if the MEWS score is Yellow or Red  Were vital signs taken at a resting state? Yes  Focused Assessment No change from prior assessment  Does the patient meet 2 or more of the SIRS criteria? Yes  Does the patient have a confirmed or suspected source of infection? No  Treat  Pain Scale 0-10  Pain Score 0  Take Vital Signs  Increase Vital Sign Frequency  Red: Q 1hr X 4 then Q 4hr X 4, if remains red, continue Q 4hrs  Escalate  MEWS: Escalate Red: discuss with charge nurse/RN and provider, consider discussing with RRT  Notify: Charge Nurse/RN  Name of Charge Nurse/RN Notified Sharee Pimple RN  Date Charge Nurse/RN Notified 11/24/22  Time Charge Nurse/RN Notified 0030  Provider Notification  Provider Name/Title Alcario Drought MD  Date Provider Notified 11/24/22  Time Provider Notified 0116  Method of Notification Call  Notification Reason Change in status  Provider response See new orders  Date of Provider Response 11/24/22  Time of Provider Response 0118  Notify: Rapid Response  Name of Rapid Response RN Notified Mindy RN  Date Rapid Response Notified 11/24/22  Time Rapid Response Notified 0210  Document  Patient Outcome Not stable and remains on department  Progress note created (see row info) Yes  Assess: SIRS CRITERIA  SIRS Temperature  1  SIRS Pulse 1  SIRS Respirations  0  SIRS WBC 0  SIRS Score Sum  2

## 2022-11-24 NOTE — Significant Event (Signed)
Rapid Response Event Note   Reason for Call :  T-94.5(R), BP-81/52  Initial Focused Assessment:  Pt lying in bed with eyes closed. He awakens easily, answers questions, and follow commands. He denies CP/SOB/dizziness. Lungs clear, diminished in bases. Skin cool and dry.  T-94.5(R), HR-104, BP-91/55, RR-20, SpO2-94% on RA.   Interventions:  Warm blankets for hypothermia LA-ordered earlier AM labs drawn Plan of Care:  Pt is asymptomatic. Pt BP 90-100s t/o the day. Use warm blankets to warm-bairhugger will drop BP further. Await AM lab results as well as LA. Continue to monitor closely. Call RRT if further assistance needed.   Event Summary:   MD Notified: Dr. Alcario Drought notified PTA RRT Call [place] Time:0240  Dillard Essex, RN

## 2022-11-24 NOTE — Progress Notes (Signed)
TRH night coverage note:  Pt seen and evaluated at bedside due to T 94.9, BPs reportedly ~65K systolic.  BGL was 69, pt given amp of D50.  HR 100-110 A.Fib (ongoing all admission).  Pt able to wake up on my arrival.  Denies SOB, denies pain (anywhere), says he feels hot (RN added blankets).  T now 95.x  BP 96/60 when pt wakes up (wondering if earlier reads in the low 80s were when he was asleep).  Poor UOP since lasix earlier yesterday (only 300cc UOP this shift).  Lactate 2.7 down slightly from yesterday.  Had bleeding from upper arm earlier in evening that is now seems to have stopped following protamine earlier in shift.  CBC this AM being added on. Check procalcitonin Check TSH Dont have a suspected source for any sepsis at this time. Hypothermia just due to hypoglycemia perhaps? Dont want to give IVF bolus at this time since pt already felt to be volume overloaded by day team and cardiology. But did let RN know they probably should hold diuretics later this AM. Dont think we need to start vasopressors at 0400 for BP 96/60.  Looks like he has been running in the 90-100 SBP range all admission.

## 2022-11-24 NOTE — Plan of Care (Signed)

## 2022-11-25 DIAGNOSIS — I509 Heart failure, unspecified: Secondary | ICD-10-CM | POA: Diagnosis not present

## 2022-11-25 DIAGNOSIS — Z66 Do not resuscitate: Secondary | ICD-10-CM

## 2022-11-25 DIAGNOSIS — I5033 Acute on chronic diastolic (congestive) heart failure: Secondary | ICD-10-CM | POA: Diagnosis not present

## 2022-11-25 DIAGNOSIS — Z515 Encounter for palliative care: Secondary | ICD-10-CM | POA: Diagnosis not present

## 2022-11-25 DIAGNOSIS — Z7189 Other specified counseling: Secondary | ICD-10-CM | POA: Diagnosis not present

## 2022-11-26 DIAGNOSIS — Z66 Do not resuscitate: Secondary | ICD-10-CM | POA: Diagnosis present

## 2022-11-27 LAB — CULTURE, BLOOD (ROUTINE X 2)
Culture: NO GROWTH
Culture: NO GROWTH
Special Requests: ADEQUATE

## 2022-11-28 ENCOUNTER — Telehealth: Payer: Self-pay | Admitting: Internal Medicine

## 2022-11-28 DIAGNOSIS — S81802A Unspecified open wound, left lower leg, initial encounter: Secondary | ICD-10-CM | POA: Diagnosis not present

## 2022-11-28 DIAGNOSIS — S81801A Unspecified open wound, right lower leg, initial encounter: Secondary | ICD-10-CM | POA: Diagnosis not present

## 2022-11-28 NOTE — Telephone Encounter (Signed)
Called daughter: Left VM concerning the passing of [name].  While she does not need to call back, if she would like I would give her a call at a different time.  Given our thoughts and prayers at this difficult time.  Please send our condolences as well.  Thanks, MAC

## 2022-12-04 ENCOUNTER — Inpatient Hospital Stay (HOSPITAL_COMMUNITY): Admission: RE | Admit: 2022-12-04 | Payer: Medicare Other | Source: Ambulatory Visit

## 2022-12-05 NOTE — Progress Notes (Signed)
Nutrition Brief Note  Chart reviewed. Patient has transitioned to comfort care.  No further nutrition interventions planned at this time.  Please re-consult RD as needed.   Loanne Drilling, MS, RD, LDN, CNSC Pager number available on Amion

## 2022-12-05 NOTE — Progress Notes (Signed)
   11/27/22 1125  Spiritual Encounters  Type of Visit Initial  Care provided to: Patient;Pt and family (wife [name], daughter Museum/gallery curator)  Referral source Nurse (RN/NT/LPN)  Reason for visit End-of-life  OnCall Visit No  Interventions  Spiritual Care Interventions Made Prayer;Compassionate presence  Spiritual Care Plan  Spiritual Care Issues Still Outstanding Chaplain will continue to follow  Follow up plan  Will return to check on patient and family this afternoon   Patient on comfort care.  Wife and daughter requested a chaplain to come and pray and read some comforting scripture. [name] (wife) specifically asked that I read John 3:16 and Psalm 23.  Though I offered to stay, family requested I check back in with them later

## 2022-12-05 NOTE — Progress Notes (Signed)
Heart Failure Navigator Progress Note  Assessed for Heart & Vascular TOC clinic readiness.  Patient does not meet criteria due to palliative care, comfort care at discharge..   Navigator will sign off at this time.    Earnestine Leys, BSN, Clinical cytogeneticist Only

## 2022-12-05 NOTE — Death Summary Note (Signed)
DEATH SUMMARY   Patient Details  Name: [name] MRN: [id number] DOB: 01-29-1933 RFF:MBWGYK, Aaron Lies, MD Admission/Discharge Information   Admit Date:  Nov 25, 2022  Date of Death: Date of Death: Nov 28, 2022  Time of Death: Time of Death: Jan 18, 1936  Length of Stay: 3   Principle Cause of death: Cardiogenic shock  Hospital Diagnoses: Principal Problem:   Acute on chronic heart failure with preserved ejection fraction (HFpEF) (HCC) Active Problems:   Unspecified protein-calorie malnutrition ([place])   Hyperlipemia   H/O prostate cancer   Anticoagulated on Coumadin   S/P aortic valve replacement with bioprosthetic valve   PAD (peripheral artery disease) (HCC)   Severe mitral regurgitation   Diabetic neuropathy (HCC)   Recurrent falls   Essential hypertension   Coagulopathy (HCC)   Physical deconditioning   Protein-calorie malnutrition, severe   Persistent atrial fibrillation (HCC)   Pressure injury of sacral region, stage 2 (HCC)   Pulmonary hypertension (HCC)   Severe tricuspid regurgitation   Atrial fibrillation, chronic (HCC)   Acute on chronic congestive heart failure ([place])   Shock ([place])   DNR (do not resuscitate)   Hospital Course: [name] is an 89 y.o. male with a history of aortic stenosis s/p AVR, AFib, HTN, HLD T2DM, prostate CA who was sent from his nursing home for increasing weakness and shortness of breath found to have acute heart failure progressing to cardiogenic shock. Given his poor prognosis, multiple comorbidities, palliative care discussions were undertaken. At one point the patient was begging staff and his daughter to let him die. Transition to comfort measures was pursued on 1/21 and he was placed on a morphine infusion, subsequently passing the following evening.  Procedures: None  Consultations: Cardiology, palliative care  The results of significant diagnostics from this hospitalization (including imaging, microbiology, ancillary and  laboratory) are listed below for reference.   Significant Diagnostic Studies: DG Abd 1 View  Result Date: 11/24/2022 CLINICAL DATA:  Abdominal pain EXAM: ABDOMEN - 1 VIEW COMPARISON:  CT abdomen pelvis 09/13/2022 FINDINGS: Left basilar atelectasis. Gas is demonstrated throughout the large and small bowel. Stool throughout the colon. Lumbar spine degenerative changes. IMPRESSION: Nonobstructive bowel gas pattern. Stool throughout the colon as can be seen with constipation. Electronically Signed   By: Lovey Newcomer M.D.   On: 11/24/2022 13:19   ECHOCARDIOGRAM COMPLETE  Result Date: 11/23/2022    ECHOCARDIOGRAM REPORT   Patient Name:   [name] Date of Exam: 11/23/2022 Medical Rec #:  [id number]         Height:       68.5 in Accession #:    [id number]        Weight:       157.0 lb Date of Birth:  1933/03/06          BSA:          1.854 m Patient Age:    75 years          BP:           96/65 mmHg Patient Gender: M                 HR:           106 bpm. Exam Location:  Inpatient Procedure: 2D Echo, Cardiac Doppler, Color Doppler and Intracardiac            Opacification Agent STAT ECHO Indications:    CHF  History:        Patient has prior  history of Echocardiogram examinations, most                 recent 10/19/2022. Arrythmias:Atrial Fibrillation,                 Signs/Symptoms:Dyspnea; Risk Factors:Diabetes and Dyslipidemia.                 CKD. 26 mm Sapien bioprosthetic valve is present                 in the aortic position. Procedure date: 01/19/13.                 Aortic Valve: 26 mm Edwards Sapien prosthetic, stented (TAVR)                 valve is present in the aortic position.  Sonographer:    Clayton Lefort RDCS (AE) Referring Phys: Lily Kocher  Sonographer Comments: Technically difficult study due to poor echo windows. IMPRESSIONS  1. Left ventricular ejection fraction, by estimation, is 35 to 40%. The left ventricle has moderately decreased function. The left ventricle demonstrates regional wall  motion abnormalities (see scoring diagram/findings for description). There is mild left ventricular hypertrophy. Left ventricular diastolic parameters are indeterminate. There is the interventricular septum is flattened in systole and diastole, consistent with right ventricular pressure and volume overload.  2. Right ventricular systolic function is moderately reduced. The right ventricular size is mildly enlarged. There is mildly elevated pulmonary artery systolic pressure. The estimated right ventricular systolic pressure is 29.9 mmHg.  3. Left atrial size was moderately dilated.  4. Right atrial size was severely dilated.  5. The mitral valve is degenerative. Moderate to severe mitral valve regurgitation. No evidence of mitral stenosis. Moderate mitral annular calcification.  6. The tricuspid valve is abnormal. Tricuspid valve regurgitation is severe.  7. The aortic valve has been repaired/replaced. Aortic valve regurgitation is mild. There is a 26 mm Edwards Sapien prosthetic (TAVR) valve present in the aortic position. Aortic valve mean gradient measures 4.0 mmHg.  8. The inferior vena cava is dilated in size with <50% respiratory variability, suggesting right atrial pressure of 15 mmHg. FINDINGS  Left Ventricle: Left ventricular ejection fraction, by estimation, is 35 to 40%. The left ventricle has moderately decreased function. The left ventricle demonstrates regional wall motion abnormalities. Definity contrast agent was given IV to delineate the left ventricular endocardial borders. The left ventricular internal cavity size was normal in size. There is mild left ventricular hypertrophy. The interventricular septum is flattened in systole and diastole, consistent with right ventricular pressure and volume overload. Left ventricular diastolic parameters are indeterminate.  LV Wall Scoring: The apex is akinetic. The inferior septum, entire inferior wall, and posterior wall are hypokinetic. The entire anterior  wall, antero-lateral wall, anterior septum, and apical lateral segment are normal. Right Ventricle: The right ventricular size is mildly enlarged. Right vetricular wall thickness was not well visualized. Right ventricular systolic function is moderately reduced. There is mildly elevated pulmonary artery systolic pressure. The tricuspid  regurgitant velocity is 2.51 m/s, and with an assumed right atrial pressure of 15 mmHg, the estimated right ventricular systolic pressure is 24.2 mmHg. Left Atrium: Left atrial size was moderately dilated. Right Atrium: Right atrial size was severely dilated. Pericardium: There is no evidence of pericardial effusion. Mitral Valve: The mitral valve is degenerative in appearance. Moderate mitral annular calcification. Moderate to severe mitral valve regurgitation. No evidence of mitral valve stenosis. Tricuspid Valve: The tricuspid valve is abnormal. Tricuspid  valve regurgitation is severe. Aortic Valve: The aortic valve has been repaired/replaced. Aortic valve regurgitation is mild. Aortic valve mean gradient measures 4.0 mmHg. Aortic valve peak gradient measures 8.0 mmHg. Aortic valve area, by VTI measures 3.05 cm. There is a 26 mm Edwards Sapien prosthetic, stented (TAVR) valve present in the aortic position. Pulmonic Valve: The pulmonic valve was not well visualized. Pulmonic valve regurgitation is trivial. Aorta: The aortic root is normal in size and structure. Venous: The inferior vena cava is dilated in size with less than 50% respiratory variability, suggesting right atrial pressure of 15 mmHg. IAS/Shunts: The interatrial septum was not well visualized.  LEFT VENTRICLE PLAX 2D LVIDd:         4.90 cm LVIDs:         4.00 cm LV PW:         1.30 cm LV IVS:        1.40 cm LVOT diam:     2.60 cm LV SV:         63 LV SV Index:   34 LVOT Area:     5.31 cm  RIGHT VENTRICLE            IVC RV Basal diam:  3.40 cm    IVC diam: 2.50 cm RV S prime:     6.20 cm/s TAPSE (M-mode): 0.6 cm LEFT  ATRIUM           Index        RIGHT ATRIUM           Index LA diam:      4.20 cm 2.27 cm/m   RA Area:     33.20 cm LA Vol (A4C): 87.0 ml 46.93 ml/m  RA Volume:   128.00 ml 69.04 ml/m  AORTIC VALVE AV Area (Vmax):    3.09 cm AV Area (Vmean):   2.99 cm AV Area (VTI):     3.05 cm AV Vmax:           141.00 cm/s AV Vmean:          94.100 cm/s AV VTI:            0.207 m AV Peak Grad:      8.0 mmHg AV Mean Grad:      4.0 mmHg LVOT Vmax:         82.10 cm/s LVOT Vmean:        53.000 cm/s LVOT VTI:          0.119 m LVOT/AV VTI ratio: 0.57  AORTA Ao Root diam: 3.30 cm MR Peak grad:    70.2 mmHg    TRICUSPID VALVE MR Mean grad:    42.0 mmHg    TR Peak grad:   25.2 mmHg MR Vmax:         419.00 cm/s  TR Vmax:        251.00 cm/s MR Vmean:        306.0 cm/s MR PISA:         3.08 cm     SHUNTS MR PISA Eff ROA: 29 mm       Systemic VTI:  0.12 m MR PISA Radius:  0.70 cm      Systemic Diam: 2.60 cm Oswaldo Milian MD Electronically signed by Oswaldo Milian MD Signature Date/Time: 11/23/2022/11:32:54 AM    Final    DG Chest 2 View  Result Date: 11/16/2022 CLINICAL DATA:  Possible sepsis. EXAM: CHEST - 2 VIEW COMPARISON:  Chest x-ray dated October 18, 2022. FINDINGS: Stable cardiomegaly status post TAVR. No focal consolidation, pleural effusion, or pneumothorax. No acute osseous abnormality. IMPRESSION: No active cardiopulmonary disease. Electronically Signed   By: Titus Dubin M.D.   On: 11/21/2022 15:37    Microbiology: Recent Results (from the past 240 hour(s))  Culture, blood (Routine x 2)     Status: None (Preliminary result)   Collection Time: 11/04/2022  2:59 PM   Specimen: BLOOD RIGHT ARM  Result Value Ref Range Status   Specimen Description BLOOD RIGHT ARM  Final   Special Requests   Final    BOTTLES DRAWN AEROBIC AND ANAEROBIC Blood Culture results may not be optimal due to an inadequate volume of blood received in culture bottles   Culture   Final    NO GROWTH 4 DAYS Performed at [facility], [place] [address] [place], [place] [address]    Report Status PENDING  Incomplete  Culture, blood (Routine x 2)     Status: None (Preliminary result)   Collection Time: 11/23/2022  6:21 PM   Specimen: BLOOD  Result Value Ref Range Status   Specimen Description BLOOD RIGHT ANTECUBITAL  Final   Special Requests   Final    BOTTLES DRAWN AEROBIC AND ANAEROBIC Blood Culture adequate volume   Culture   Final    NO GROWTH 4 DAYS Performed at [facility], [place] [address] [place], [place] [address]    Report Status PENDING  Incomplete    Time spent: 35 minutes  Signed: Patrecia Pour, MD 2022/12/06

## 2022-12-05 NOTE — Progress Notes (Signed)
Daily Progress Note   Patient Name: [name]       Date: 2022/12/19 DOB: 1933/02/17  Age: 89 y.o. MRN#: [id number] Attending Physician: Patrecia Pour, MD Primary Care Physician: [name], MD Admit Date: 11/19/2022 Length of Stay: 3 days  Reason for Consultation/Follow-up: Establishing goals of care and Terminal Care  HPI/Patient Profile:  89 y.o. male  with past medical history of aortic stenosis, atrial fibrillation, hypertension, hyperlipidemia, h/o AVR [bioprosthetic valve], diabetes mellitus, prostate cancer admitted on 12/03/2022 with poor appetite, weakness, dyspnea.    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  PMT has been consulted to assist with goals of care conversation.  Subjective:   Subjective: Chart Reviewed. Updates received. Patient Assessed. Created space and opportunity for patient  and family to explore thoughts and feelings regarding current medical situation.  Today's Discussion: Today I saw the patient at the bedside, but he is unresponsive on comfort care and approaching end-of-life.  He appears to be actively dying.  His daughter was present at the bedside.  We discussed the current plan for continued comfort care as he approaches end-of-life.  At this point I do not think he is stable enough for transfer to a hospice facility even if one became available.  She states [facility] is bringing her mother over to see her father, which is very important to them.  At the bedside his daughter seems appropriately tearful.  I offered ongoing support and informed her that somebody from palliative medicine would come check at least once a day to see how he was doing.  Also, if he appears uncomfortable in any way, the nurses and doctors are able to contact us.  I provided emotional and general support through therapeutic listening, empathy, sharing of stories, therapeutic touch, and  other techniques. I answered all questions and addressed all concerns to the best of my ability.  Review of Systems  Unable to perform ROS: Patient unresponsive    Objective:   Vital Signs:  BP (!) 54/23   Pulse 100   Temp (!) 97.1 F (36.2 C) (Oral)   Resp 20   Wt 71.9 kg   SpO2 100%   BMI 23.75 kg/m   Physical Exam: Physical Exam Vitals and nursing note reviewed.  Constitutional:      General: He is sleeping. He is not in acute distress.    Appearance: He is ill-appearing.     Comments: No overt signs of discomfort or distress. Appears actively dying.  HENT:     Head: Normocephalic and atraumatic.  Pulmonary:     Effort: No respiratory distress.     Comments: Irregular breathing    Palliative Assessment/Data: 10%    Existing Vynca/ACP Documentation: DNR (dated 03/04/2022) Goals of care document (dated 03/04/2022)  Assessment & Plan:   Impression: Present on Admission:  Acute on chronic heart failure with preserved ejection fraction (HFpEF) (HCC)  Hyperlipemia  PAD (peripheral artery disease) (HCC)  Essential hypertension  Unspecified protein-calorie malnutrition (HCC)  Persistent atrial fibrillation (HCC)  Diabetic neuropathy (HCC)  Coagulopathy (HCC)  Physical deconditioning  Protein-calorie malnutrition, severe  Pulmonary hypertension (HCC)  Pressure injury of sacral region, stage 2 (HCC)  Severe mitral regurgitation  Severe tricuspid regurgitation  SUMMARY OF RECOMMENDATIONS   Continue current comfort care Remain DNR Ongoing family emotional and spiritual support PMT will continue to follow  Symptom Management:  Biotene 15 mL topical as needed dry mouth  Dulcolax 10 mg PR daily as needed moderate constipation Robinul 0.4 mg IV every 4 hours as needed excessive secretions Ativan 1 mg IV every 4 hours as needed anxiety Morphine drip 1-4 mg/hour IV titrate as needed for pain or dyspnea Morphine bolus via infusion 1 mg every 30 minutes as needed  uncontrolled pain, distress, tachypnea Zofran 4 mg IV every 6 hours as needed nausea Polyvinyl alcohol 1.4% ophthalmic 1 drop OU 4 times daily as needed dry eyes  Code Status: DNR  Prognosis: Hours - Days  Discharge Planning: Anticipated Hospital Death  Discussed with: Patient family, medical team, nursing team  Thank you for allowing [place] to participate in the care of CHRISTOHPER DUBE PMT will continue to support holistically.  Billing based on MDM: High  Problems Addressed: One acute or chronic illness or injury that poses a threat to life or bodily function  Amount and/or Complexity of Data: Category 3:Discussion of management or test interpretation with external physician/other qualified health care professional/appropriate source (not separately reported)  Risks: Parenteral controlled substances and Decision not to resuscitate or to de-escalate care because of poor prognosis  Walden Field, NP Palliative Medicine Team  Team Phone # [phone] (Nights/Weekends)  07/03/2021, 8:17 AM

## 2022-12-05 NOTE — Progress Notes (Signed)
PROGRESS NOTE  Brief Narrative: [name] is an 89 y.o. male with a history of aortic stenosis s/p AVR, AFib, HTN, HLD T2DM, prostate CA who was sent from his nursing home for increasing weakness and shortness of breath found to have acute heart failure progressing to cardiogenic shock. Given his poor prognosis, multiple comorbidities, palliative care discussions were undertaken. At one point the patient was begging staff and his daughter to let him die. Transition to comfort measures was pursued on 1/21 and he continues on a morphine infusion.   Subjective: Daughter tearful at bedside but at peace. He's been breathing irregularly this morning since about 5 am but at times does appear slightly labored.   Objective: BP (!) 89/51 (BP Location: Left Arm)   Pulse (!) 113   Temp (!) 97.1 F (36.2 C) (Oral)   Resp 20   Wt 71.9 kg   SpO2 100%   BMI 23.75 kg/m   Elderly, frail male with irregular tachypnea. Not responsive.   Assessment & Plan: [name] has reasonably well-controlled respiratory distress, and continues on morphine infusion. Support offered to his daughter at bedside. We will increase the ceiling dose on the morphine infusion. D/w palliative NP and RN. Anticipate inpatient demise in hours.  Patrecia Pour, MD Pager on [facility] 12/22/22, 9:34 AM

## 2022-12-05 DEATH — deceased

## 2022-12-19 ENCOUNTER — Telehealth: Payer: Self-pay | Admitting: Internal Medicine

## 2022-12-19 NOTE — Telephone Encounter (Signed)
[facility] [address] is requesting order from 11/14/22 be signed.  Please sign and return  Order # [id number]

## 2022-12-20 NOTE — Telephone Encounter (Signed)
Order signed by MD and faxed to number indicated.

## 2023-01-15 ENCOUNTER — Other Ambulatory Visit: Payer: Medicare Other

## 2023-01-15 ENCOUNTER — Ambulatory Visit: Payer: Medicare Other | Admitting: Internal Medicine

## 2023-01-27 ENCOUNTER — Ambulatory Visit: Payer: Medicare Other | Admitting: Internal Medicine

## 2023-02-14 ENCOUNTER — Ambulatory Visit: Payer: Medicare Other | Admitting: Podiatry

## 2023-03-26 ENCOUNTER — Encounter (INDEPENDENT_AMBULATORY_CARE_PROVIDER_SITE_OTHER): Payer: Medicare Other | Admitting: Ophthalmology

## 2023-04-15 ENCOUNTER — Ambulatory Visit: Payer: Medicare Other | Admitting: Internal Medicine

## 2023-09-24 ENCOUNTER — Ambulatory Visit: Payer: Medicare Other | Admitting: Neurology
